# Patient Record
Sex: Male | Born: 1980 | Race: Black or African American | Hispanic: No | Marital: Single | Smoking: Never smoker
Health system: Southern US, Community
[De-identification: ages and names within clinical notes are randomized; demographics above are authoritative.]

## PROBLEM LIST (undated history)

## (undated) ENCOUNTER — Encounter

## (undated) ENCOUNTER — Ambulatory Visit
Payer: BLUE CROSS/BLUE SHIELD | Attending: Student in an Organized Health Care Education/Training Program | Primary: Student in an Organized Health Care Education/Training Program

## (undated) ENCOUNTER — Ambulatory Visit: Payer: PRIVATE HEALTH INSURANCE

## (undated) ENCOUNTER — Telehealth

## (undated) ENCOUNTER — Encounter: Attending: Specialist | Primary: Specialist

## (undated) ENCOUNTER — Ambulatory Visit: Payer: BLUE CROSS/BLUE SHIELD | Attending: Neurology | Primary: Neurology

## (undated) ENCOUNTER — Encounter
Attending: Student in an Organized Health Care Education/Training Program | Primary: Student in an Organized Health Care Education/Training Program

## (undated) ENCOUNTER — Ambulatory Visit
Payer: BLUE CROSS/BLUE SHIELD | Attending: Rehabilitative and Restorative Service Providers" | Primary: Rehabilitative and Restorative Service Providers"

## (undated) ENCOUNTER — Encounter: Attending: Nurse Practitioner | Primary: Nurse Practitioner

## (undated) ENCOUNTER — Ambulatory Visit
Payer: PRIVATE HEALTH INSURANCE | Attending: Student in an Organized Health Care Education/Training Program | Primary: Student in an Organized Health Care Education/Training Program

## (undated) ENCOUNTER — Ambulatory Visit: Payer: PRIVATE HEALTH INSURANCE | Attending: Clinical | Primary: Clinical

## (undated) ENCOUNTER — Ambulatory Visit

## (undated) ENCOUNTER — Ambulatory Visit: Payer: PRIVATE HEALTH INSURANCE | Attending: Registered" | Primary: Registered"

## (undated) ENCOUNTER — Encounter: Attending: Sports Medicine | Primary: Sports Medicine

## (undated) ENCOUNTER — Ambulatory Visit: Payer: BLUE CROSS/BLUE SHIELD

## (undated) ENCOUNTER — Encounter: Attending: Gastroenterology | Primary: Gastroenterology

## (undated) ENCOUNTER — Ambulatory Visit: Payer: BLUE CROSS/BLUE SHIELD | Attending: Family | Primary: Family

## (undated) ENCOUNTER — Telehealth: Attending: Internal Medicine | Primary: Internal Medicine

## (undated) ENCOUNTER — Telehealth
Attending: Student in an Organized Health Care Education/Training Program | Primary: Student in an Organized Health Care Education/Training Program

## (undated) ENCOUNTER — Encounter
Payer: PRIVATE HEALTH INSURANCE | Attending: Rehabilitative and Restorative Service Providers" | Primary: Rehabilitative and Restorative Service Providers"

## (undated) ENCOUNTER — Encounter: Attending: Anesthesiology | Primary: Anesthesiology

## (undated) ENCOUNTER — Ambulatory Visit: Payer: PRIVATE HEALTH INSURANCE | Attending: Family Medicine | Primary: Family Medicine

## (undated) ENCOUNTER — Encounter: Attending: Psychiatric/Mental Health | Primary: Psychiatric/Mental Health

## (undated) ENCOUNTER — Ambulatory Visit: Attending: Clinical | Primary: Clinical

## (undated) ENCOUNTER — Non-Acute Institutional Stay
Payer: PRIVATE HEALTH INSURANCE | Attending: Plastic and Reconstructive Surgery | Primary: Plastic and Reconstructive Surgery

## (undated) ENCOUNTER — Ambulatory Visit: Payer: PRIVATE HEALTH INSURANCE | Attending: Anesthesiology | Primary: Anesthesiology

## (undated) ENCOUNTER — Encounter
Attending: Rehabilitative and Restorative Service Providers" | Primary: Rehabilitative and Restorative Service Providers"

## (undated) ENCOUNTER — Other Ambulatory Visit

## (undated) ENCOUNTER — Encounter: Attending: Diagnostic Radiology | Primary: Diagnostic Radiology

## (undated) ENCOUNTER — Encounter: Payer: PRIVATE HEALTH INSURANCE | Attending: Family Medicine | Primary: Family Medicine

## (undated) ENCOUNTER — Ambulatory Visit: Payer: BLUE CROSS/BLUE SHIELD | Attending: Registered" | Primary: Registered"

## (undated) ENCOUNTER — Telehealth: Attending: Plastic and Reconstructive Surgery | Primary: Plastic and Reconstructive Surgery

## (undated) ENCOUNTER — Encounter: Attending: Family Medicine | Primary: Family Medicine

## (undated) ENCOUNTER — Encounter
Payer: PRIVATE HEALTH INSURANCE | Attending: Student in an Organized Health Care Education/Training Program | Primary: Student in an Organized Health Care Education/Training Program

## (undated) ENCOUNTER — Telehealth: Attending: "Endocrinology | Primary: "Endocrinology

## (undated) ENCOUNTER — Encounter: Attending: Clinical | Primary: Clinical

## (undated) ENCOUNTER — Ambulatory Visit: Payer: PRIVATE HEALTH INSURANCE | Attending: Gastroenterology | Primary: Gastroenterology

## (undated) ENCOUNTER — Encounter: Attending: Family | Primary: Family

## (undated) ENCOUNTER — Non-Acute Institutional Stay: Payer: PRIVATE HEALTH INSURANCE

## (undated) ENCOUNTER — Ambulatory Visit
Payer: PRIVATE HEALTH INSURANCE | Attending: Plastic and Reconstructive Surgery | Primary: Plastic and Reconstructive Surgery

## (undated) ENCOUNTER — Ambulatory Visit
Attending: Student in an Organized Health Care Education/Training Program | Primary: Student in an Organized Health Care Education/Training Program

## (undated) ENCOUNTER — Ambulatory Visit: Payer: PRIVATE HEALTH INSURANCE | Attending: Specialist | Primary: Specialist

## (undated) ENCOUNTER — Ambulatory Visit
Payer: PRIVATE HEALTH INSURANCE | Attending: Rehabilitative and Restorative Service Providers" | Primary: Rehabilitative and Restorative Service Providers"

## (undated) ENCOUNTER — Telehealth: Attending: Children | Primary: Children

## (undated) ENCOUNTER — Encounter: Payer: BLUE CROSS/BLUE SHIELD | Attending: Psychiatric/Mental Health | Primary: Psychiatric/Mental Health

## (undated) ENCOUNTER — Ambulatory Visit: Attending: Pharmacist | Primary: Pharmacist

## (undated) ENCOUNTER — Ambulatory Visit: Payer: PRIVATE HEALTH INSURANCE | Attending: Internal Medicine | Primary: Internal Medicine

## (undated) ENCOUNTER — Encounter: Payer: PRIVATE HEALTH INSURANCE | Attending: Gastroenterology | Primary: Gastroenterology

## (undated) ENCOUNTER — Telehealth: Attending: Specialist | Primary: Specialist

## (undated) ENCOUNTER — Ambulatory Visit: Attending: Registered" | Primary: Registered"

## (undated) ENCOUNTER — Inpatient Hospital Stay: Payer: BLUE CROSS/BLUE SHIELD

## (undated) ENCOUNTER — Encounter: Payer: PRIVATE HEALTH INSURANCE | Attending: Sports Medicine | Primary: Sports Medicine

## (undated) ENCOUNTER — Telehealth: Attending: Psychiatric/Mental Health | Primary: Psychiatric/Mental Health

## (undated) ENCOUNTER — Ambulatory Visit: Payer: BLUE CROSS/BLUE SHIELD | Attending: Specialist | Primary: Specialist

## (undated) ENCOUNTER — Ambulatory Visit: Attending: Nurse Practitioner | Primary: Nurse Practitioner

## (undated) ENCOUNTER — Encounter: Attending: Registered" | Primary: Registered"

## (undated) ENCOUNTER — Non-Acute Institutional Stay
Payer: PRIVATE HEALTH INSURANCE | Attending: Student in an Organized Health Care Education/Training Program | Primary: Student in an Organized Health Care Education/Training Program

## (undated) ENCOUNTER — Ambulatory Visit: Attending: Family Medicine | Primary: Family Medicine

## (undated) ENCOUNTER — Ambulatory Visit
Payer: Medicaid (Managed Care) | Attending: Student in an Organized Health Care Education/Training Program | Primary: Student in an Organized Health Care Education/Training Program

## (undated) ENCOUNTER — Ambulatory Visit: Attending: Hematology | Primary: Hematology

## (undated) ENCOUNTER — Telehealth: Attending: Clinical | Primary: Clinical

## (undated) DIAGNOSIS — K7689 Other specified diseases of liver: Secondary | ICD-10-CM

## (undated) DIAGNOSIS — K3184 Gastroparesis: Secondary | ICD-10-CM

## (undated) DIAGNOSIS — F419 Anxiety disorder, unspecified: Secondary | ICD-10-CM

## (undated) DIAGNOSIS — F909 Attention-deficit hyperactivity disorder, unspecified type: Secondary | ICD-10-CM

## (undated) DIAGNOSIS — G4700 Insomnia, unspecified: Secondary | ICD-10-CM

## (undated) DIAGNOSIS — L509 Urticaria, unspecified: Secondary | ICD-10-CM

## (undated) DIAGNOSIS — Q0700 Arnold-Chiari syndrome without spina bifida or hydrocephalus: Secondary | ICD-10-CM

## (undated) DIAGNOSIS — J45909 Unspecified asthma, uncomplicated: Secondary | ICD-10-CM

## (undated) DIAGNOSIS — G43909 Migraine, unspecified, not intractable, without status migrainosus: Secondary | ICD-10-CM

## (undated) DIAGNOSIS — K219 Gastro-esophageal reflux disease without esophagitis: Secondary | ICD-10-CM

## (undated) HISTORY — DX: Insomnia, unspecified: G47.00

## (undated) HISTORY — DX: Anxiety disorder, unspecified: F41.9

## (undated) HISTORY — DX: Migraine, unspecified, not intractable, without status migrainosus: G43.909

## (undated) HISTORY — DX: Urticaria, unspecified: L50.9

## (undated) HISTORY — PX: OTHER SURGICAL HISTORY: SHX169

## (undated) HISTORY — DX: Gastroparesis: K31.84

## (undated) HISTORY — DX: Arnold-Chiari syndrome without spina bifida or hydrocephalus: Q07.00

---
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
## 2003-06-21 HISTORY — PX: OOPHORECTOMY: SHX86

## 2012-06-20 HISTORY — PX: CHOLECYSTECTOMY: SHX55

## 2016-08-25 ENCOUNTER — Encounter: Payer: Self-pay | Admitting: Family Medicine

## 2016-09-16 ENCOUNTER — Emergency Department (HOSPITAL_COMMUNITY): Payer: 59

## 2016-09-16 ENCOUNTER — Emergency Department (HOSPITAL_COMMUNITY)
Admission: EM | Admit: 2016-09-16 | Discharge: 2016-09-16 | Disposition: A | Discharge status: Home / Self Care | Payer: 59 | Attending: Emergency Medicine | Admitting: Emergency Medicine

## 2016-09-16 ENCOUNTER — Encounter (HOSPITAL_COMMUNITY): Payer: Self-pay | Admitting: Emergency Medicine

## 2016-09-16 DIAGNOSIS — S20219A Contusion of unspecified front wall of thorax, initial encounter: Secondary | ICD-10-CM | POA: Insufficient documentation

## 2016-09-16 DIAGNOSIS — S299XXA Unspecified injury of thorax, initial encounter: Secondary | ICD-10-CM | POA: Diagnosis present

## 2016-09-16 DIAGNOSIS — Y929 Unspecified place or not applicable: Secondary | ICD-10-CM | POA: Diagnosis not present

## 2016-09-16 DIAGNOSIS — Y9389 Activity, other specified: Secondary | ICD-10-CM | POA: Diagnosis not present

## 2016-09-16 DIAGNOSIS — Y999 Unspecified external cause status: Secondary | ICD-10-CM | POA: Insufficient documentation

## 2016-09-16 DIAGNOSIS — W228XXA Striking against or struck by other objects, initial encounter: Secondary | ICD-10-CM | POA: Diagnosis not present

## 2016-09-16 LAB — CBC WITH DIFFERENTIAL/PLATELET
Basophils Absolute: 0 10*3/uL (ref 0.0–0.1)
Basophils Relative: 0 %
Eosinophils Absolute: 0.1 10*3/uL (ref 0.0–0.7)
Eosinophils Relative: 1 %
HCT: 42.3 % (ref 36.0–46.0)
Hemoglobin: 13.7 g/dL (ref 12.0–15.0)
Lymphocytes Relative: 29 %
Lymphs Abs: 2 10*3/uL (ref 0.7–4.0)
MCH: 24.2 pg — ABNORMAL LOW (ref 26.0–34.0)
MCHC: 32.4 g/dL (ref 30.0–36.0)
MCV: 74.9 fL — ABNORMAL LOW (ref 78.0–100.0)
Monocytes Absolute: 0.5 10*3/uL (ref 0.1–1.0)
Monocytes Relative: 7 %
Neutro Abs: 4.4 10*3/uL (ref 1.7–7.7)
Neutrophils Relative %: 63 %
Platelets: 372 10*3/uL (ref 150–400)
RBC: 5.65 MIL/uL — ABNORMAL HIGH (ref 3.87–5.11)
RDW: 17.8 % — ABNORMAL HIGH (ref 11.5–15.5)
WBC: 7 10*3/uL (ref 4.0–10.5)

## 2016-09-16 LAB — BASIC METABOLIC PANEL
Anion gap: 6 (ref 5–15)
BUN: 12 mg/dL (ref 6–20)
CO2: 27 mmol/L (ref 22–32)
Calcium: 9.3 mg/dL (ref 8.9–10.3)
Chloride: 104 mmol/L (ref 101–111)
Creatinine, Ser: 0.91 mg/dL (ref 0.44–1.00)
GFR calc Af Amer: >60 mL/min (ref >60)
GFR calc non Af Amer: >60 mL/min (ref >60)
Glucose, Bld: 80 mg/dL (ref 65–99)
Potassium: 3.6 mmol/L (ref 3.5–5.1)
Sodium: 137 mmol/L (ref 135–145)

## 2016-09-16 MED ORDER — IOPAMIDOL (ISOVUE-370) INJECTION 76%
100.0000 mL | Freq: Once | INTRAVENOUS | Status: AC | PRN
  Administered 2016-09-16: 100 mL via INTRAVENOUS

## 2016-09-16 MED ORDER — SODIUM CHLORIDE 0.9 % IV BOLUS (SEPSIS)
1000.0000 mL | Freq: Once | INTRAVENOUS | Status: AC
  Administered 2016-09-16: 1000 mL via INTRAVENOUS

## 2016-09-16 MED ORDER — MORPHINE SULFATE (PF) 4 MG/ML IV SOLN
4.0000 mg | Freq: Once | INTRAVENOUS | Status: AC
  Administered 2016-09-16: 4 mg via INTRAVENOUS
  Filled 2016-09-16: qty 1

## 2016-09-16 MED ORDER — IOPAMIDOL (ISOVUE-370) INJECTION 76%
INTRAVENOUS | Status: AC
  Filled 2016-09-16: qty 100

## 2016-09-16 NOTE — ED Triage Notes (Signed)
Pt complaint of mid chest soreness post "car pressing on it." Pt verbalizes was changing oil without parking brake on resulting in car to fall. Pt verbalizes "caught most of it with hands" but impact was made with chest. Was able to slide out afterwards.

## 2016-09-16 NOTE — ED Notes (Signed)
MD at bedside. 

## 2016-09-16 NOTE — ED Provider Notes (Signed)
[facility]-EMERGENCY DEPT Provider Note   CSN: [id number] Arrival date & time: 09/16/16  1739  By signing my name below, I, Gary Potts, attest that this documentation has been prepared under the direction and in the presence of Raeford Razor, MD. Electronically Signed: Rosario Potts, ED Scribe. 09/16/16. 6:39 PM.  History   Chief Complaint Chief Complaint  Patient presents with  . Chest Injury   The history is provided by the patient. No language interpreter was used.    HPI Comments: Gary Potts is a 36 y.o. male with no pertinent PMHx, who presents to the Emergency Department complaining of sudden onset, mild to moderate mid-sternal chest pain beginning s/p injury which occurred several hours ago. Per pt, she was working under her car today when her car came off of the carjack which caused the bar of the device to strike her across the mid-chest. No other reported injury. She was able to extricate herself from this position without difficulty. Pt took a Naproxen with some relief of her pain prior to her arrival. Pain is worse with palpation over the area. She notes some radiation of her pain into her upper back which began shortly following her arrival into the ED. She denies dizziness, light-headedness, or any other associated symptoms.   History reviewed. No pertinent past medical history.  There are no active problems to display for this patient.  History reviewed. No pertinent surgical history.  OB History    No data available     Home Medications    Prior to Admission medications   Not on File    Family History No family history on file.  Social History Social History  Substance Use Topics  . Smoking status: Never Smoker  . Smokeless tobacco: Not on file  . Alcohol use Yes   Allergies   Penicillins and Latex  Review of Systems Review of Systems  Cardiovascular: Positive for chest pain.  Neurological: Negative for dizziness, syncope and  light-headedness.  All other systems reviewed and are negative.  Physical Exam Updated Vital Signs BP 126/87 (BP Location: Left Arm)   Pulse 75   Temp 98.1 F (36.7 C) (Oral)   Resp 18   SpO2 100%   Physical Exam  Constitutional: She appears well-developed and well-nourished.  HENT:  Head: Normocephalic.  Right Ear: External ear normal.  Left Ear: External ear normal.  Nose: Nose normal.  Mouth/Throat: Oropharynx is clear and moist.  Eyes: Conjunctivae are normal. Right eye exhibits no discharge. Left eye exhibits no discharge.  Neck: Normal range of motion.  Cardiovascular: Normal rate, regular rhythm and normal heart sounds.   No murmur heard. Pulmonary/Chest: Effort normal and breath sounds normal. No respiratory distress. She has no wheezes. She has no rales. She exhibits tenderness.  Mild to moderate sternal tenderness.   Abdominal: Soft. She exhibits no distension. There is no tenderness. There is no rebound and no guarding.  Musculoskeletal: Normal range of motion. She exhibits no edema or tenderness.  Neurological: She is alert. No cranial nerve deficit. Coordination normal.  Skin: Skin is warm and dry. No rash noted. No erythema. No pallor.  Psychiatric: She has a normal mood and affect. Her behavior is normal.  Nursing note and vitals reviewed.  ED Treatments / Results  DIAGNOSTIC STUDIES: Oxygen Saturation is 100% on RA, normal by my interpretation.   COORDINATION OF CARE: 6:35 PM-Discussed next steps with pt. Pt verbalized understanding and is agreeable with the plan.   Labs (all labs  ordered are listed, but only abnormal results are displayed) Labs Reviewed - No data to display  EKG  EKG Interpretation None      Radiology Dg Chest 2 View  Result Date: 09/16/2016 CLINICAL DATA:  Chest injury. EXAM: CHEST  2 VIEW COMPARISON:  None. FINDINGS: There is no focal parenchymal opacity. There is no pleural effusion or pneumothorax. The heart and mediastinal  contours are unremarkable. Acute depressed distal sternal fracture with 7 mm of depression. IMPRESSION: Acute depressed distal sternal fracture with 7 mm of depression. If there is clinical concern regarding vascular injury, recommend a CTA of the chest. Electronically Signed   By: Elige Ko   On: 09/16/2016 18:19   Procedures Procedures   Medications Ordered in ED Medications - No data to display  Initial Impression / Assessment and Plan / ED Course  I have reviewed the triage vital signs and the nursing notes.  Pertinent labs & imaging results that were available during my care of the patient were reviewed by me and considered in my medical decision making (see chart for details).     36 year old male with blunt chest trauma. Possible sternal fracture on x-ray but reassuring CT. Likely chest wall contusion. She has no acute distress. Work of breathing is normal. When necessary NSAIDs. Return precautions discussed.  Final Clinical Impressions(s) / ED Diagnoses   Final diagnoses:  Contusion of chest wall, unspecified laterality, initial encounter   New Prescriptions New Prescriptions   No medications on file   I personally preformed the services scribed in my presence. The recorded information has been reviewed is accurate. Raeford Razor, MD.    Raeford Razor, MD 09/19/16 1213

## 2016-11-21 ENCOUNTER — Encounter: Payer: Self-pay | Admitting: Family Medicine

## 2016-12-30 ENCOUNTER — Ambulatory Visit (INDEPENDENT_AMBULATORY_CARE_PROVIDER_SITE_OTHER): Payer: 59 | Admitting: Pediatrics

## 2016-12-30 ENCOUNTER — Encounter: Payer: Self-pay | Admitting: Pediatrics

## 2016-12-30 VITALS — BP 100/70 | HR 84 | Temp 98.2°F | Resp 12 | Ht 63.58 in | Wt 144.8 lb

## 2016-12-30 DIAGNOSIS — T7800XD Anaphylactic reaction due to unspecified food, subsequent encounter: Secondary | ICD-10-CM

## 2016-12-30 DIAGNOSIS — T7800XA Anaphylactic reaction due to unspecified food, initial encounter: Secondary | ICD-10-CM | POA: Insufficient documentation

## 2016-12-30 DIAGNOSIS — J4520 Mild intermittent asthma, uncomplicated: Secondary | ICD-10-CM

## 2016-12-30 DIAGNOSIS — J3089 Other allergic rhinitis: Secondary | ICD-10-CM | POA: Insufficient documentation

## 2016-12-30 DIAGNOSIS — J4530 Mild persistent asthma, uncomplicated: Secondary | ICD-10-CM | POA: Insufficient documentation

## 2016-12-30 DIAGNOSIS — L503 Dermatographic urticaria: Secondary | ICD-10-CM | POA: Diagnosis not present

## 2016-12-30 MED ORDER — FLUTICASONE PROPIONATE 50 MCG/ACT NA SUSP: NASAL | 5 refills | Status: DC

## 2016-12-30 MED ORDER — RANITIDINE HCL 150 MG PO TABS: ORAL_TABLET | ORAL | 5 refills | Status: DC

## 2016-12-30 MED ORDER — MONTELUKAST SODIUM 10 MG PO TABS: ORAL_TABLET | ORAL | 5 refills | Status: DC

## 2016-12-30 MED ORDER — EPINEPHRINE 0.3 MG/0.3ML IJ SOAJ: INTRAMUSCULAR | 1 refills | Status: DC

## 2016-12-30 MED ORDER — ALBUTEROL SULFATE HFA 108 (90 BASE) MCG/ACT IN AERS: 2.0000 | INHALATION_SPRAY | RESPIRATORY_TRACT | 1 refills | Status: DC | PRN

## 2016-12-30 NOTE — Patient Instructions (Signed)
Environmental control of dust mite Fluticasone 2 sprays per nostril at night for stuffy nose Keep the dog out of the  bedroom Zyrtec 10 mg twice a day for itching Ranitidine 150 mg twice a day for itching. It may help heartburn Montelukast  10 mg once a day for coughing or wheezing Pro-air 2 puffs every 4 hours if needed for wheezing or coughing spells Do foods with salicylates make you itch?  Avoid fish and shellfish for now. If you have an allergic reaction take Benadryl 50 mg every 4 hours and if you have life-threatening symptoms inject  with EpiPen 0.3 mg I will let you know the results of your blood tests for fish and shellfish allergy

## 2016-12-30 NOTE — Progress Notes (Signed)
[address] [place] [address] Dept: [phone]  New Patient Note  Patient ID: Gary Potts, male    DOB: 11/14/1980  Age: 36 y.o. MRN: [id number] Date of Office Visit: 12/30/2016 Referring provider: Ileana LaddWong, Francis P, MD [address] [place], [place] [address]    Chief Complaint: Allergic Rhinitis  (all year round); Cough (every night); Urticaria (all over started about 3 years ago. Has had boils which then treated with bactrim.); Rash (where she feels likes she is itching within. pt. has seen dermatologist.); and Allergic Reaction (alaskan fish broke out with a rash. throat getting puffy.)  [facility] for evaluation of itching and hives for 3 years. He has had folliculitis during this time. He is transgender and emotionally male. He has had a cough for about a year. The cough is worse at night. He has gastroesophageal reflux not well controlled with omeprazole 20 mg twice a day He  has had hives from shellfish. He ate an [place] fish and developed hives and swelling of his face and throat.Marland Kitchen. He has had perennial nasal congestion aggravated by exposure to dust for several years. He has never had asthma or eczema or chronic urticaria in the past  Review of Systems  Constitutional: Negative.   HENT:       Perennial nasal congestion for several years  Eyes: Negative.   Respiratory:       Coughing spells at night for about a year  Cardiovascular: Negative.   Gastrointestinal:       Gastroesophageal reflux not responsive to  omeprazole 20 mg twice a day. 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   Skin:       Itching and hives for 3 years. Folliculitis for about 3 years.  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 acetaminophen (TYLENOL) 325 MG tablet Take 650 mg by mouth every 6 (six) hours as needed (pain).  . clindamycin (CLEOCIN T) 1 % lotion   . clonazePAM (KLONOPIN) 2 MG tablet Take 2 mg by mouth 3 (three) times daily as needed for irritation.  Marland Kitchen. doxycycline (VIBRAMYCIN) 100 MG capsule   . Gabapentin 300 MG/6ML SOLN Take 6 mLs by mouth at bedtime.  Marland Kitchen. ibuprofen (ADVIL,MOTRIN) 600 MG tablet   . testosterone cypionate (DEPOTESTOSTERONE CYPIONATE) 200 MG/ML injection Inject 0.4 mLs into the muscle once a week.  Marland Kitchen. albuterol (PROAIR HFA) 108 (90 Base) MCG/ACT inhaler Inhale 2 puffs into the lungs every 4 (four) hours as needed for wheezing or shortness of breath.  . EPINEPHrine (EPIPEN 2-PAK) 0.3 mg/0.3 mL IJ SOAJ injection Use as directed for severe allergic reaction.  . fluticasone (FLONASE) 50 MCG/ACT nasal spray 2 sprays per nostril at night for stuffy nose.  . montelukast (SINGULAIR) 10 MG tablet One tablet by mouth once a day for coughing or wheezing.  . naproxen (NAPROSYN) 500 MG tablet Take 500 mg by mouth 2 (two) times daily with a meal.  . ranitidine (ZANTAC) 150 MG tablet Take one tablet by mouth twice a day for itching. It may help heartburn.   No facility-administered encounter medications on file as of 12/30/2016.      Drug Allergies:  Allergies  Allergen Reactions  .  Penicillins Anaphylaxis and Rash    Pt can tolerate amoxicillin  Has patient had a PCN reaction causing immediate rash, facial/tongue/throat swelling, SOB or lightheadedness with hypotension: no  Has patient had a PCN reaction causing severe rash involving mucus membranes or skin necrosis: no Has patient had a PCN reaction that required hospitalization: unknown Has patient had a PCN reaction occurring within the last 10 years: no If all of the above answers are "NO", then may proceed with Cephalosporin use.   . Latex Rash    Family History: Sherry's family history includes Allergic rhinitis in her  mother; Sinusitis in her mother and sister; Urticaria in her sister..There is no family history of asthma, angioedema, eczema, food allergies, lupus, chronic bronchitis or emphysema  Social and environmental. There is a dog in the home. He is not exposed to cigarette smoking. He has not smoked cigarettes in the past. He is unemployed  Physical Exam: BP 100/70 (BP Location: Left Arm, Patient Position: Sitting, Cuff Size: Normal)   Pulse 84   Temp 98.2 F (36.8 C) (Oral)   Resp 12   Ht 5' 3.58" (1.615 m)   Wt 144 lb 13.5 oz (65.7 kg)   SpO2 98%   BMI 25.19 kg/m    Physical Exam  Constitutional: She is oriented to person, place, and time. She appears well-developed and well-nourished.  HENT:  Eyes normal. Ears normal. Nose mild swelling of nasal  turbinates. Pharynx normal.  Neck: Neck supple. No thyromegaly present.  Cardiovascular:  S1 and S2 normal no murmurs  Pulmonary/Chest:  Clear to percussion and auscultation  Abdominal: Soft. There is no tenderness (no hepatosplenomegaly).  Lymphadenopathy:    She has no cervical adenopathy.  Neurological: She is alert and oriented to person, place, and time.  Skin:  Hyperpigmentation in areas that she had had folliculitis. Dermographia noted. One hive noted in the left arm  Psychiatric: She has a normal mood and affect. Her behavior is normal. Judgment and thought content normal.  Vitals reviewed.   Diagnostics: FVC 2.97 L FEV1 2.70 L. Predicted FVC 3.19 L predicted FEV1 2.68 L. After albuterol 2 puffs FVC 2.98 L FEV1 2.74 L-the spirometry is in the normal range and there was no significant improvement after albuterol  Allergy skin tests were positive to dust mites. There was mild reactivity to shrimp and flounder. There was mild reactivity to dog   Assessment  Assessment and Plan: 1. 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 Mild intermittent reactive airway disease without complication   3.  Anaphylactic shock due to food, subsequent encounter   4. Other allergic rhinitis   5. Dermographia     Meds ordered this encounter  Medications  . EPINEPHrine (EPIPEN 2-PAK) 0.3 mg/0.3 mL IJ SOAJ injection    Sig: Use as directed for severe allergic reaction.    Dispense:  2 Device    Refill:  1    Dispense mylan brand or generic whichever is cheaper for the pt.  . fluticasone (FLONASE) 50 MCG/ACT nasal spray    Sig: 2 sprays per nostril at night for stuffy nose.    Dispense:  16 g    Refill:  5  . ranitidine (ZANTAC) 150 MG tablet    Sig: Take one tablet by mouth twice a day for itching. It may help heartburn.    Dispense:  60 tablet    Refill:  5  . montelukast (SINGULAIR) 10 MG tablet    Sig: One tablet  by mouth once a day for coughing or wheezing.    Dispense:  34 tablet    Refill:  5  . albuterol (PROAIR HFA) 108 (90 Base) MCG/ACT inhaler    Sig: Inhale 2 puffs into the lungs every 4 (four) hours as needed for wheezing or shortness of breath.    Dispense:  1 Inhaler    Refill:  1    Patient Instructions  Environmental control of dust mite Fluticasone 2 sprays per nostril at night for stuffy nose Keep the dog out of the  bedroom Zyrtec 10 mg twice a day for itching Ranitidine 150 mg twice a day for itching. It may help heartburn Montelukast  10 mg once a day for coughing or wheezing Pro-air 2 puffs every 4 hours if needed for wheezing or coughing spells Do foods with salicylates make you itch?  Avoid fish and shellfish for now. If you have an allergic reaction take Benadryl 50 mg every 4 hours and if you have life-threatening symptoms inject  with EpiPen 0.3 mg I will let you know the results of your blood tests for fish and shellfish allergy   Return in about 4 weeks (around 01/27/2017).   Thank you for the opportunity to care for this patient.  Please do not hesitate to contact me with questions.  Tonette Bihari, M.D.  [facility] [address] [place], [place] [address] [phone]

## 2017-01-07 LAB — ALLERGEN PROFILE, FOOD-FISH
Codfish IgE: <0.1 kU/L
Halibut IgE: <0.1 kU/L

## 2017-01-07 LAB — ALLERGEN PROFILE, SHELLFISH
Clam IgE: <0.1 kU/L
F023-IGE CRAB: 0.34 kU/L — AB
F080-IgE Lobster: 0.26 kU/L — AB
F290-IgE Oyster: <0.1 kU/L
SHRIMP IGE: 0.18 kU/L — AB
Scallop IgE: <0.1 kU/L

## 2017-01-19 ENCOUNTER — Telehealth: Payer: Self-pay

## 2017-01-19 NOTE — Telephone Encounter (Signed)
Pt called in and stated Started having a sever itchy rash, that is raised. He stats it starts at his neck and goes all the way to his feet. Itchiest part is his ears. States there is a lot of mucus he is throwing up.He continues on all meds that was prescribed by Dr B.   Please advise

## 2017-01-19 NOTE — Telephone Encounter (Signed)
Patient has an appointment scheduled for Monday at 3:30. We offered an appointment for tomorrow at 3:15 and Monday at 8:30  but patient couldn't make it. Gary Potts CMA advised her to stop Doxycycline immediately and go to ER if any further complications.

## 2017-01-19 NOTE — Telephone Encounter (Signed)
Lyla SonCarrie stated that this was a new and different type of rash than ever before experienced by the patient.  Due to the aggressive spread of the rash, I believe the patient should discontinue doxycycline immediately and come in for an office visit tomorrow.  If his symptoms progress, the patient should seek medical attention immediately.

## 2017-01-19 NOTE — Telephone Encounter (Signed)
Please call patient and get her an appointment ASAP

## 2017-01-19 NOTE — Telephone Encounter (Signed)
Per dr bobbitt I have told pt to stop doxy and come in to see us monday

## 2017-01-23 ENCOUNTER — Ambulatory Visit (INDEPENDENT_AMBULATORY_CARE_PROVIDER_SITE_OTHER): Payer: 59 | Admitting: Pediatrics

## 2017-01-23 ENCOUNTER — Ambulatory Visit: Payer: 59 | Admitting: Pediatrics

## 2017-01-23 ENCOUNTER — Encounter: Payer: Self-pay | Admitting: Pediatrics

## 2017-01-23 VITALS — BP 124/74 | HR 76 | Temp 98.1°F | Resp 16 | Ht 63.47 in | Wt 145.6 lb

## 2017-01-23 DIAGNOSIS — L503 Dermatographic urticaria: Secondary | ICD-10-CM | POA: Diagnosis not present

## 2017-01-23 DIAGNOSIS — T7800XD Anaphylactic reaction due to unspecified food, subsequent encounter: Secondary | ICD-10-CM | POA: Diagnosis not present

## 2017-01-23 DIAGNOSIS — J3089 Other allergic rhinitis: Secondary | ICD-10-CM | POA: Diagnosis not present

## 2017-01-23 DIAGNOSIS — L508 Other urticaria: Secondary | ICD-10-CM | POA: Diagnosis not present

## 2017-01-23 DIAGNOSIS — J4530 Mild persistent asthma, uncomplicated: Secondary | ICD-10-CM

## 2017-01-23 NOTE — Progress Notes (Signed)
[name] [place] [address] Dept: [phone]  FOLLOW UP NOTE  Patient ID: Gary Potts, male    DOB: May 19, 1981  Age: 36 y.o. MRN: [id number] Date of Office Visit: 01/23/2017  Assessment  Chief Complaint: Rash (generalized rash every day at 3pm after eating lunch.); Nasal Congestion; and Cough  HPI [facility] presents for follow-up of chronic urticaria for 3 years. Last week while at work, he ate beef noodle  soup and had more itching and shortness of breath. They're doing a great deal of cleaning at work. He is allergic to dust mites. He has been having significant nasal congestion . He had hives again after eating on the 2 subsequent days while at work. Doxycycline was stopped he has continued to itch. The folliculitis became  worse after doxycycline was stopped, so he went back on doxycycline and the itching is the  same as in the past . He has dermographia. He avoids [place]an fish and shellfish. He is able to eat flounder, if he takes Benadryl. He has never had an allergic reaction to flounder  He is not having any coughing or wheezing except for the episode last week.Marland Kitchen His serum Immunocap IgE was negative for cod, halibut, tuna, salmon, trout, mackerel, pike, salmon. He had mild reactivity to crab, shrimp, lobster . He has had a full evaluation for chronic  urticaria by his dermatologist.   Drug Allergies:  Allergies  Allergen Reactions  . Penicillins Anaphylaxis and Rash    Pt can tolerate amoxicillin  Has patient had a PCN reaction causing immediate rash, facial/tongue/throat swelling, SOB or lightheadedness with hypotension: no  Has patient had a PCN reaction causing severe rash involving mucus membranes or skin necrosis: no Has patient had a PCN reaction that required hospitalization: unknown Has patient had a PCN reaction occurring within the last 10 years: no If all of the above answers are "NO", then may proceed with Cephalosporin use.   . Shellfish Allergy  Anaphylaxis  . Latex Rash    Physical Exam: BP 124/74 (BP Location: Left Arm, Patient Position: Sitting, Cuff Size: Normal)   Pulse 76   Temp 98.1 F (36.7 C) (Oral)   Resp 16   Ht 5' 3.47" (1.612 m)   Wt 145 lb 9.6 oz (66 kg)   SpO2 98%   BMI 25.42 kg/m    Physical Exam  Constitutional: She is oriented to person, place, and time. She appears well-developed and well-nourished.  HENT:  Eyes normal. Ears normal. Nose moderate swelling of nasal turbinates. Pharynx normal..  Neck: Neck supple.  Cardiovascular:  S1 and S2 normal no murmurs  Pulmonary/Chest:  Clear to percussion and auscultation  Lymphadenopathy:    She has no cervical adenopathy.  Neurological: She is alert and oriented to person, place, and time.  Skin:  Clear except for mild folliculitis  Psychiatric: She has a normal mood and affect. Her behavior is normal. Judgment and thought content normal.  Vitals reviewed.   Diagnostics:  FVC 2.60 L FEV1 2.40 L. Predicted FVC 3.19 L predicted FEV1 2.68 L-the spirometry is in the normal range  Assessment and Plan: 1. Mild persistent reactive airway disease without complication   2. Chronic urticaria   3. Dermographia   4. Anaphylactic shock due to food, subsequent encounter   5. Other allergic rhinitis        Patient Instructions  Zyrtec 10 mg twice a day for itching Ranitidine 150 mg twice a day  Montelukast 10 mg once a day  for coughing and wheezing. It may help hives Pro-air 2 puffs every 4 hours if needed for wheezing or coughing spells Add prednisone 20 mg twice a day for 3 days, 20 mg on the fourth day, 10 mg in the fifth day to see if it helps the itching and hives He will see if foods that contain salicylates make him itch Call me next week. I gave him information regarding the use of Xolair for chronic urticaria . If he is still itching next week , I will give him a sample injection of Xolair 300 mg We will apply for Xolair administration due to the  severity of his chronic urticaria for 3 years  Avoid [place]askan fish and shellfish and the beef noodle soup. If he has an allergic reaction he will  take Benadryl 50 mg every 4 hours and if he has life-threatening symptoms inject with EpiPen 0.3 mg   Return in about 4 weeks (around 02/20/2017).    Thank you for the opportunity to care for this patient.  Please do not hesitate to contact me with questions.  Tonette BihariJ. A. Elyas Villamor, M.D.  [facility] [place] [address] [place], [place] [address] [phone]

## 2017-01-23 NOTE — Patient Instructions (Addendum)
Zyrtec 10 mg twice a day for itching Ranitidine 150 mg twice a day  Montelukast 10 mg once a day for coughing and wheezing. It may help hives Pro-air 2 puffs every 4 hours if needed for wheezing or coughing spells Add prednisone 20 mg twice a day for 3 days, 20 mg on the fourth day, 10 mg in the fifth day to see if it helps the itching and hives He will see if foods that contain salicylates make him itch Call me next week. I gave him information regarding the use of Xolair for chronic urticaria . If he is still itching next week , I will give him a sample injection of Xolair 300 mg We will apply for Xolair administration due to the severity of his chronic urticaria for 3 years  Avoid [place]askan fish and shellfish and the beef noodle soup. If he has an allergic reaction he will  take Benadryl 50 mg every 4 hours and if he has life-threatening symptoms inject with EpiPen 0.3 mg

## 2017-01-25 ENCOUNTER — Telehealth: Payer: Self-pay | Admitting: Pediatrics

## 2017-01-25 NOTE — Telephone Encounter (Signed)
Pt was in the office to see Dr. Beaulah DinningBardelas this week and was put on Prednisone. Pt says she is itching badly from it. Please call pt back. Thanks

## 2017-01-25 NOTE — Telephone Encounter (Signed)
Spoke with pt. He states this has happened before with prednisone. I advised him to stop taking it, he stated he already had. When he took it with breakfast and lunch yesterday he developed uncontrollable itching and whelps. He is continuing on all medications as prescribed. Please advise

## 2017-01-25 NOTE — Telephone Encounter (Signed)
Switch from cetirizine twice a day to levocetirizine twice a day. Also, if symptoms continue have pt come in for sample Xolair injection has discussed by Dr. Beaulah DinningBardelas.

## 2017-01-26 NOTE — Telephone Encounter (Signed)
Pt started vomiting last night and has not stopped. He believes it is from the prednisone. He attempted to take his allergy meds last night and vomited them up, again this morning tried to take them and same thing. No fever no chills Please advise

## 2017-01-26 NOTE — Telephone Encounter (Signed)
Spoke to patient advised to go see pcp. States he is in the process changing pcps. Advised to go to urgent care if vomiting persist patient verbalized understanding.

## 2017-01-26 NOTE — Telephone Encounter (Signed)
Pt should see PCP for vomiting.

## 2017-01-30 ENCOUNTER — Telehealth: Payer: Self-pay

## 2017-01-30 NOTE — Telephone Encounter (Signed)
Pt called in this and stated that he  Is still having itching and hives. Pt states it is red and warm and very itchy.  He states this is all still from the prednisone. He has stopped taking prednisone. Pt is taking benadryl and zyrtec and still not getting relief.  Please advise

## 2017-01-31 NOTE — Telephone Encounter (Addendum)
Called patient.  He is taking Zyrtec 10 mg twice a day and ranitidine twice a day.  He scheduled an appointment for this week to get the first Xolair (free dose of Xolair per Dr. Beaulah DinningBardelas) on Friday 02/03/17 at 9:00 am and he will contact Tammy to get Xolair set up.  [employer] approved Xolair. Dr. Beaulah DinningBardelas informed.

## 2017-01-31 NOTE — Telephone Encounter (Signed)
He needs to follow precisely the plan that I gave him at the last visit. If we have Xolair for chronic urticaria as a sample we can give it . He was approved for 6 months of Xolair

## 2017-02-03 ENCOUNTER — Encounter: Payer: Self-pay | Admitting: Pediatrics

## 2017-02-03 ENCOUNTER — Ambulatory Visit (INDEPENDENT_AMBULATORY_CARE_PROVIDER_SITE_OTHER): Payer: 59

## 2017-02-03 DIAGNOSIS — L500 Allergic urticaria: Secondary | ICD-10-CM | POA: Diagnosis not present

## 2017-02-03 MED ORDER — OMALIZUMAB 150 MG ~~LOC~~ SOLR
300.0000 mg | SUBCUTANEOUS | Status: DC
  Administered 2017-02-03 – 2018-02-14 (×8): 300 mg via SUBCUTANEOUS

## 2017-02-11 ENCOUNTER — Ambulatory Visit: Payer: 59 | Admitting: Family Medicine

## 2017-02-15 ENCOUNTER — Ambulatory Visit: Admission: RE | Admit: 2017-02-15 | Discharge: 2017-02-15 | Attending: Sports Medicine | Admitting: Sports Medicine

## 2017-02-15 DIAGNOSIS — M25511 Pain in right shoulder: Principal | ICD-10-CM

## 2017-02-17 ENCOUNTER — Other Ambulatory Visit: Payer: Self-pay

## 2017-02-17 ENCOUNTER — Telehealth: Payer: Self-pay

## 2017-02-17 MED ORDER — HYDROXYZINE HCL 50 MG PO TABS: ORAL_TABLET | ORAL | 3 refills | Status: DC

## 2017-02-17 NOTE — Telephone Encounter (Signed)
Spoke to pt. To let him know that Dr. Dellis AnesGallagher wanted him to take 2 tablets of cetirizine 10mg  and hydroxyzine 50mg  2 tablets at night. To help with the itching. Pt. Aware.

## 2017-02-17 NOTE — Telephone Encounter (Signed)
Pt. States he received a cortisone injection this past Monday and by the evening he was experiencing extreme unbearable itching. He took benadryl and his antihistamine. Yesterday he stated he experienced trouble breathing and took 3 benadryl his trouble breathing subsided. Pt. Does carry epipen told him if that happens again he should have used his epipen. Pt. Still experiencing itching. Please advise.

## 2017-02-17 NOTE — Telephone Encounter (Signed)
Reviewed note. Let's recommended cetirizine 20mg  (two tablets) in the morning and hydroxyzine 50mg  (two tablets) at night.   Malachi BondsJoel Liliahna Cudd, MD [facility]

## 2017-02-22 ENCOUNTER — Encounter: Payer: Self-pay | Admitting: Family Medicine

## 2017-02-22 ENCOUNTER — Ambulatory Visit (INDEPENDENT_AMBULATORY_CARE_PROVIDER_SITE_OTHER): Payer: 59 | Admitting: Family Medicine

## 2017-02-22 VITALS — BP 132/78 | HR 103 | Temp 98.2°F | Resp 18 | Ht 63.5 in | Wt 148.8 lb

## 2017-02-22 DIAGNOSIS — F902 Attention-deficit hyperactivity disorder, combined type: Secondary | ICD-10-CM

## 2017-02-22 DIAGNOSIS — Z23 Encounter for immunization: Secondary | ICD-10-CM | POA: Diagnosis not present

## 2017-02-22 MED ORDER — AMPHETAMINE-DEXTROAMPHET ER 30 MG PO CP24: 30.0000 mg | ORAL_CAPSULE | Freq: Every day | ORAL | 0 refills | Status: DC

## 2017-02-22 MED ORDER — CLONAZEPAM 0.5 MG PO TABS: 0.5000 mg | ORAL_TABLET | Freq: Two times a day (BID) | ORAL | 0 refills | Status: DC | PRN

## 2017-02-22 NOTE — Progress Notes (Signed)
Subjective:  By signing my name below, I, Stann Ore, attest that this documentation has been prepared under the direction and in the presence of Norberto Sorenson, MD. Electronically Signed: Stann Ore, Scribe. 02/22/2017 , 9:47 AM .  Patient was seen in Room 3 .   Patient ID: Gary Potts, male    DOB: 1981/03/27, 36 y.o.   MRN: [id number] Chief Complaint  Patient presents with  . Establish Care   HPI Gary Potts is a 36 y.o. male who presents to Primary Care at [facility] to transfer and establish care.   Allergist- Dr. Beaulah Dinning, has urticaria in the summer, usually resolves after hot season ends.   Dermatologist- taking doxycyline for folliculitis  Endocrinologist- Dr. Dionisio Paschal, on Testosterone.   Weight change- Dr. Beaulah Dinning thought weight change due to Testosterone.   Wt Readings from Last 3 Encounters:  02/22/17 148 lb 12.8 oz (67.5 kg)  01/23/17 145 lb 9.6 oz (66 kg)  12/30/16 144 lb 13.5 oz (65.7 kg)   PCP- was seeing Dr. Modesto Charon at [place]. Was prescribed meloxicam for sternum contusion, then referred to Dr. Leonel Ramsay for right shoulder pain and sternum, and was given cortisone shot.   ADHD- Sees Harless Litten, seen last week, started seeing in Jan 2018. Name change in Jan, from [place] to [place]. Will see psychiatry at [facility]. Been off Adderall for about 2 months now.  History: "hyperactive" at 36 years old, started ritalin at 36 years old. Became resistant to it around 6th grade. Was off medications in 7th grade, but was too hyper and was back on ritalin. Was followed by Dallie Piles in [facility] at [facility] Tried Vyvanse without relief. Was tried off klonopin for valium, but valium caused him to be more agitated.   Family- Recently moved up here from [place] with his wife.   Work- Works in Office manager.   Past Medical History:  Diagnosis Date  . Anxiety   . Migraines   . Rash   . Urticaria    Prior to Admission medications   Medication Sig Start  Date End Date Taking? Authorizing Provider  acetaminophen (TYLENOL) 325 MG tablet Take 650 mg by mouth every 6 (six) hours as needed (pain).   Yes [provider]  albuterol (PROAIR HFA) 108 (90 Base) MCG/ACT inhaler Inhale 2 puffs into the lungs every 4 (four) hours as needed for wheezing or shortness of breath. 12/30/16  Yes Bardelas, Bonnita Hollow, MD  clindamycin (CLEOCIN T) 1 % lotion  12/05/16  Yes [provider]  clonazePAM (KLONOPIN) 2 MG tablet Take 2 mg by mouth 3 (three) times daily as needed for irritation. 09/01/16  Yes [provider]  dicyclomine (BENTYL) 10 MG capsule  01/12/17  Yes [provider]  doxycycline (VIBRAMYCIN) 100 MG capsule Take by mouth daily.  12/05/16  Yes [provider]  EPINEPHrine (EPIPEN 2-PAK) 0.3 mg/0.3 mL IJ SOAJ injection Use as directed for severe allergic reaction. 12/30/16  Yes Bardelas, Jose A, MD  fluticasone (FLONASE) 50 MCG/ACT nasal spray 2 sprays per nostril at night for stuffy nose. 12/30/16  Yes Bardelas, Bonnita Hollow, MD  Gabapentin 300 MG/6ML SOLN Take 6 mLs by mouth at bedtime. 08/26/16  Yes [provider]  hydrOXYzine (ATARAX/VISTARIL) 50 MG tablet 2 tablets by mouth at night to help with the itching. 02/17/17  Yes Fletcher Anon, MD  ibuprofen (ADVIL,MOTRIN) 600 MG tablet  11/21/16  Yes [provider]  montelukast (SINGULAIR) 10 MG tablet One tablet by mouth  once a day for coughing or wheezing. 12/30/16  Yes Bardelas, Bonnita Hollow, MD  ranitidine (ZANTAC) 150 MG tablet Take one tablet by mouth twice a day for itching. It may help heartburn. 12/30/16  Yes Bardelas, Bonnita Hollow, MD  testosterone cypionate (DEPOTESTOSTERONE CYPIONATE) 200 MG/ML injection Inject 0.4 mLs into the muscle once a week. 08/31/16  Yes [provider]   Allergies  Allergen Reactions  . Penicillins Anaphylaxis and Rash    Pt can tolerate amoxicillin  Has patient had a PCN reaction causing immediate rash, facial/tongue/throat  swelling, SOB or lightheadedness with hypotension: no  Has patient had a PCN reaction causing severe rash involving mucus membranes or skin necrosis: no Has patient had a PCN reaction that required hospitalization: unknown Has patient had a PCN reaction occurring within the last 10 years: no If all of the above answers are "NO", then may proceed with Cephalosporin use.   . Shellfish Allergy Anaphylaxis  . Latex Rash   Past Surgical History:  Procedure Laterality Date  . CHOLECYSTECTOMY  2014  . OOPHORECTOMY Right 2005   Family History  Problem Relation Age of Onset  . Allergic rhinitis Mother   . Sinusitis Mother   . Sinusitis Sister   . Urticaria Sister   . Angioedema Neg Hx   . Asthma Neg Hx   . Eczema Neg Hx   . Immunodeficiency Neg Hx    Social History   Social History  . Marital status: Married    Spouse name: N/A  . Number of children: N/A  . Years of education: N/A   Social History Main Topics  . Smoking status: Never Smoker  . Smokeless tobacco: Never Used  . Alcohol use Yes  . Drug use: No  . Sexual activity: Yes   Other Topics Concern  . None   Social History Narrative  . None   Depression screen [facility] 2/9 02/22/2017  Decreased Interest 0  Down, Depressed, Hopeless 0  PHQ - 2 Score 0    Review of Systems  Constitutional: Negative for chills, fatigue, fever and unexpected weight change.  Respiratory: Negative for cough.   Gastrointestinal: Negative for constipation, diarrhea, nausea and vomiting.  Skin: Negative for rash and wound.  Neurological: Negative for dizziness, weakness and headaches.  Psychiatric/Behavioral: Positive for decreased concentration.       Objective:   Physical Exam  Constitutional: She is oriented to person, place, and time. She appears well-developed and well-nourished. No distress.  HENT:  Head: Normocephalic and atraumatic.  Eyes: Pupils are equal, round, and reactive to light. EOM are normal.  Neck: Neck supple.    Cardiovascular: Normal rate, regular rhythm, S1 normal, S2 normal and normal heart sounds.  Exam reveals no gallop and no friction rub.   No murmur heard. Pulmonary/Chest: Effort normal and breath sounds normal. No respiratory distress. She has no decreased breath sounds.  Musculoskeletal: Normal range of motion.  Neurological: She is alert and oriented to person, place, and time.  Skin: Skin is warm and dry.  Psychiatric: She has a normal mood and affect. Her behavior is normal.  Nursing note and vitals reviewed.   BP 132/78   Pulse (!) 103   Temp 98.2 F (36.8 C) (Oral)   Resp 18   Ht 5' 3.5" (1.613 m)   Wt 148 lb 12.8 oz (67.5 kg)   SpO2 93%   BMI 25.95 kg/m       Assessment & Plan:   1. Attention deficit hyperactivity disorder (ADHD),  combined type   2. Need for prophylactic vaccination and inoculation against influenza    Pt has appt w/ psych coming up.  Recheck in 1 mo. Records req from 6 specialists/prior PCP.  Orders Placed This Encounter  Procedures  . Flu Vaccine QUAD 36+ mos IM    Meds ordered this encounter  Medications  . diclofenac sodium (VOLTAREN) 1 % GEL    Sig: Apply 1 application topically 2 (two) times daily as needed.  Marland Kitchen. amphetamine-dextroamphetamine (ADDERALL XR) 30 MG 24 hr capsule    Sig: Take 1 capsule (30 mg total) by mouth daily.    Dispense:  30 capsule    Refill:  0  . clonazePAM (KLONOPIN) 0.5 MG tablet    Sig: Take 1 tablet (0.5 mg total) by mouth 2 (two) times daily as needed for anxiety.    Dispense:  60 tablet    Refill:  0   Today I have utilized the [place] Controlled Substance Registry's online query to confirm compliance regarding the patient's narcotic pain medications. My review reveals appropriate prescription fills and that [facility] is the sole provider of these medications. Rechecks will occur regularly and the patient is aware of our use of the system.  I personally performed the services described in this  documentation, which was scribed in my presence. The recorded information has been reviewed and considered, and addended by me as needed.   Norberto SorensonEva Shaw, M.D.  Primary Care at [facility]  [place] [address] [place], [place] [address] [phone] phone [phone]) [phone] fax  02/24/17 10:46 PM

## 2017-02-22 NOTE — Patient Instructions (Addendum)
Follow-up in 1 month to check on med change and ensure smooth transition to psychiatry as well as to review outside records and ensure any other med refills. If I continue to prescribe you ADD/stimulant therapy, we will do a drug screen at your next visit to ensure it correlates with your prescribed medications. Try to wean OFF of the klonopin gradually as much as possible.   IF you received an x-ray today, you will receive an invoice from [facility]. Please contact [facility] at [phone] with questions or concerns regarding your invoice.   IF you received labwork today, you will receive an invoice from [facility]. Please contact [facility] at [phone] with questions or concerns regarding your invoice.   Our billing staff will not be able to assist you with questions regarding bills from these companies.  You will be contacted with the lab results as soon as they are available. The fastest way to get your results is to activate your My Chart account. Instructions are located on the last page of this paperwork. If you have not heard from us regarding the results in 2 weeks, please contact this office.     [facility] at [facility] Policy for Prescribing Controlled Substances  (Revised 04/2012, 12/2016) 1. Prescriptions for controlled substances will be filled by ONE provider at PCP with whom you have established and developed a plan for your care, including follow-up. 2. Providers at PCP follow the health system rules for prescribing controlled substances, which was developed using recommendations from the Centers For Disease Control. 3. You are encouraged to schedule an appointment with your prescriber in advance for follow-up visits whenever possible.

## 2017-02-24 ENCOUNTER — Telehealth: Payer: Self-pay

## 2017-02-24 MED ORDER — HYDROXYZINE HCL 50 MG PO TABS: 50.0000 mg | ORAL_TABLET | Freq: Every evening | ORAL | 3 refills | Status: DC | PRN

## 2017-02-24 NOTE — Telephone Encounter (Signed)
I called patient to advise on hydroxyzine 50 mg. Dr. Beaulah DinningBardelas advised me that patient should only be taking 1 tablet at night if needed for the itching. Patient also advised me that the Xolair was approved and his Xolair is in office in [place] for administration next week.

## 2017-02-27 ENCOUNTER — Telehealth: Payer: Self-pay

## 2017-02-27 NOTE — Telephone Encounter (Signed)
Pt called in stating that he broke out in hives yesterday on his back. He is doing everything he is suppost too. He is taking all his meds. He states they are itchy. He believes only thing new is the change in weather and while being outside yesterday for maybe an hour they popped up.  Please advise

## 2017-02-27 NOTE — Telephone Encounter (Signed)
Continue on all his medications for hives  Let me know if he is not doing well

## 2017-02-28 NOTE — Telephone Encounter (Signed)
Spoke with pt informed him of what dr b stated.

## 2017-02-28 NOTE — Telephone Encounter (Signed)
Lm for pt to call us back  

## 2017-03-01 ENCOUNTER — Other Ambulatory Visit: Payer: Self-pay | Admitting: Allergy

## 2017-03-01 ENCOUNTER — Telehealth: Payer: Self-pay | Admitting: Allergy

## 2017-03-01 MED ORDER — LEVOCETIRIZINE DIHYDROCHLORIDE 5 MG PO TABS: 5.0000 mg | ORAL_TABLET | Freq: Every evening | ORAL | 5 refills | Status: DC

## 2017-03-01 MED ORDER — PREDNISONE 10 MG PO TABS: ORAL_TABLET | ORAL | 0 refills | Status: DC

## 2017-03-01 NOTE — Telephone Encounter (Signed)
Switch from cetirizine to levocetirzine. Add ranitidine 150 bid if patient is not already taking it. Provide prednisone 20 mg x 4 days, 10 mg x1 day, then stop.  Thanks.

## 2017-03-01 NOTE — Telephone Encounter (Signed)
I sent scripts. I called patient. Left message for them to call back.

## 2017-03-01 NOTE — Telephone Encounter (Signed)
Patient called said she has had hives since Sunday and getting worse.said it was  on chest,back and .bottom Taking all Medicine that she is suppose to.Getting worse. Said Benadryl does not help.Please advise

## 2017-03-01 NOTE — Telephone Encounter (Signed)
Dr Worthy FlankBardelas  Giovonni called back and said she was on the levoceritizne and was on the Ranitidine 159 also. Patient said she was taking hydrOXYzine also. she said Dr. Beaulah DinningBardelas Did not want him to take prednisone Was talking to her and she hung up on me.

## 2017-03-02 ENCOUNTER — Ambulatory Visit (INDEPENDENT_AMBULATORY_CARE_PROVIDER_SITE_OTHER): Payer: 59 | Admitting: *Deleted

## 2017-03-02 DIAGNOSIS — L500 Allergic urticaria: Secondary | ICD-10-CM | POA: Diagnosis not present

## 2017-03-02 NOTE — Telephone Encounter (Signed)
Patient is scheduled for injection this morning. I will have shot nurse discuss at that time.

## 2017-03-03 ENCOUNTER — Ambulatory Visit: Payer: Self-pay

## 2017-03-06 NOTE — Telephone Encounter (Signed)
Levocetirize, ranitidine, proair prn, doxycyline  qd, hydroxyzine  qhs for itching, skin ointment, clindamycin 1% bid, montelukast 10 mg prn for cough or wheezing, Flonase 2 sprays prn, gabapentin qhs, clonazepam 0.5mg  tid.   Pt states the hives are still there and have not left.  Next Xolair injection is oct 11th, just had last injection sept 13.

## 2017-03-06 NOTE — Telephone Encounter (Signed)
Pt called back and stated that he recently moved and that after every shoer/bath his break out always gets wrose

## 2017-03-06 NOTE — Telephone Encounter (Signed)
Lm for pt to call us back  

## 2017-03-06 NOTE — Telephone Encounter (Signed)
Call patient and get a list of all the medications that he is taking . How are the hives doing ? When is he due  for the next Xolair injection?

## 2017-03-07 NOTE — Telephone Encounter (Signed)
Call patient. Does he take showers at night or in the morning. Is he on  levocetirizine 5 mg in the morning and hydroxyzine 50 mg at night . Do not rub hard after a shower or bath . Cool compress on the skin for a few minutes will help the itching after shower or bath

## 2017-03-07 NOTE — Telephone Encounter (Signed)
Pt takes levo bid and hydroxazine qhs. Pt has tried the cool compress and patting dry after shower per his dermatologist. He states that hasn't helped. I asked him if he has had his water tested, he stated he has not.

## 2017-03-08 NOTE — Telephone Encounter (Signed)
Patient went to dermatologist yesterday was advised there was nothing they can do for this. Patient states he is having itching and hives that are now on face states the only thing that helps is prednisone. Dr Beaulah Dinning please advise if prednisone can be sent in.

## 2017-03-09 ENCOUNTER — Other Ambulatory Visit: Payer: Self-pay

## 2017-03-09 ENCOUNTER — Telehealth: Payer: Self-pay

## 2017-03-09 MED ORDER — PREDNISONE 10 MG PO TABS: ORAL_TABLET | ORAL | 0 refills | Status: DC

## 2017-03-09 MED ORDER — PREDNISONE 10 MG PO TABS
ORAL_TABLET | ORAL | 0 refills | Status: DC
Start: 2017-03-09 — End: 2017-07-01

## 2017-03-09 NOTE — Telephone Encounter (Signed)
Call in prednisone 10 mg tablets to take one tablet twice a day for 4 days, one tablet on the fifth day. Continue on his other medications

## 2017-03-09 NOTE — Telephone Encounter (Signed)
Sent prednisone and lm for pt to call me back regarding me doing so

## 2017-03-09 NOTE — Telephone Encounter (Signed)
Informed pt of sending in prednisone

## 2017-03-09 NOTE — Telephone Encounter (Signed)
Pt wanted to know if he should continue on doxycycline. He believes it is no longer helping with his skin. He has been on it 6 months and wants to know he can come off it?  Please advise

## 2017-03-10 NOTE — Telephone Encounter (Signed)
Informed pt of what dr B stated 

## 2017-03-10 NOTE — Telephone Encounter (Signed)
Lm for pt to call us back  

## 2017-03-10 NOTE — Telephone Encounter (Signed)
Call patient. Tell him to go off doxycycline to see how he does . Continue on all his other medications for hives

## 2017-03-11 ENCOUNTER — Encounter: Payer: Self-pay | Admitting: Family Medicine

## 2017-03-11 ENCOUNTER — Ambulatory Visit (INDEPENDENT_AMBULATORY_CARE_PROVIDER_SITE_OTHER): Payer: 59 | Admitting: Family Medicine

## 2017-03-11 VITALS — BP 136/88 | HR 83 | Temp 98.6°F | Resp 16 | Ht 63.0 in | Wt 143.0 lb

## 2017-03-11 DIAGNOSIS — F902 Attention-deficit hyperactivity disorder, combined type: Secondary | ICD-10-CM

## 2017-03-11 DIAGNOSIS — K047 Periapical abscess without sinus: Secondary | ICD-10-CM | POA: Diagnosis not present

## 2017-03-11 DIAGNOSIS — K029 Dental caries, unspecified: Secondary | ICD-10-CM | POA: Diagnosis not present

## 2017-03-11 MED ORDER — CHLORHEXIDINE GLUCONATE 0.12 % MT SOLN: 15.0000 mL | Freq: Four times a day (QID) | OROMUCOSAL | 0 refills | Status: DC

## 2017-03-11 MED ORDER — AMPHETAMINE-DEXTROAMPHET ER 30 MG PO CP24: 30.0000 mg | ORAL_CAPSULE | Freq: Every day | ORAL | 0 refills | Status: DC

## 2017-03-11 MED ORDER — IBUPROFEN 600 MG PO TABS: 600.0000 mg | ORAL_TABLET | Freq: Four times a day (QID) | ORAL | 0 refills | Status: DC | PRN

## 2017-03-11 MED ORDER — AMOXICILLIN 400 MG/5ML PO SUSR: 1000.0000 mg | Freq: Two times a day (BID) | ORAL | 0 refills | Status: DC

## 2017-03-11 NOTE — Progress Notes (Addendum)
Subjective:    Patient ID: Gary Potts, male    DOB: 07-01-1980, 36 y.o.   MRN: [id number] Chief Complaint  Patient presents with  . Abscess    in mouth/ x 1wk    HPI  Stonewall is a delightful 36 yo male here today with complaints of a dental abscess that is now improving. He is accompanied by his wife Educational psychologist. Has h/o folliculitis so went to derm and was put on doxycycline who told him that the rash would come back. So allergist did trial of 1 wk of prednisone - did prior trial of prednisone for 1 week. Allergist Dr. Stephannie Li had him taper off of doxy as thought it might be counteracting.  Thought the flu shot and cortisone shot might have reacted with the doxy. If the prednisone doesn't work this week then retry after the doxycycline course is done.  If that doesn't work, he will try a different course of attack.  Had abscess on mouth But improved since starting prednisone this week.  Has good dental insurance but he is on the waiting list to get in at dental works but he is having pain and has to take a lot of ibuprofen to counteract that and tylenol not working. Has peridontal disease, "teeth are basically uprooting themselves" - expecting to get false teeth as will eventually need to get all teeth pull.  Was causing sharp cest pain and felt drainage and pressure from lymph node down throat to lymphnode.  Can't do pills - prefers liquid antibiotic - amox.  Doing well on the klonopin 0.5 - doesn't even think about the med anymore - still has a lot left. Doesn't feel like he has had any prob decreasing and anxiety is much better as not as panicked about not functioning well at work and getting in trouble w/ boss for inefficiencies and mistakes now that ADD is being treated again. Has appt w/ psych next week at [facility]  Still seeing Nathen May at [facility] regularly who offered to get pt in w/ other psych but pt was worried it would take to long as private psychiatrists  can take 1-2 mos or more until an appt and he didn't think she could wait that long initially but is definitely feeling much more stable on current regimen.  Past Medical History:  Diagnosis Date  . Anxiety   . Migraines   . Rash   . Urticaria    Past Surgical History:  Procedure Laterality Date  . CHOLECYSTECTOMY  2014  . OOPHORECTOMY Right 2005   Current Outpatient Prescriptions on File Prior to Visit  Medication Sig Dispense Refill  . albuterol (PROAIR HFA) 108 (90 Base) MCG/ACT inhaler Inhale 2 puffs into the lungs every 4 (four) hours as needed for wheezing or shortness of breath. 1 Inhaler 1  . clindamycin (CLEOCIN T) 1 % lotion     . clonazePAM (KLONOPIN) 0.5 MG tablet Take 1 tablet (0.5 mg total) by mouth 2 (two) times daily as needed for anxiety. 60 tablet 0  . diclofenac sodium (VOLTAREN) 1 % GEL Apply 1 application topically 2 (two) times daily as needed.    . dicyclomine (BENTYL) 10 MG capsule     . doxycycline (VIBRAMYCIN) 100 MG capsule Take by mouth daily.     Marland Kitchen EPINEPHrine (EPIPEN 2-PAK) 0.3 mg/0.3 mL IJ SOAJ injection Use as directed for severe allergic reaction. 2 Device 1  . fluticasone (FLONASE) 50 MCG/ACT nasal spray 2 sprays per  nostril at night for stuffy nose. 16 g 5  . Gabapentin 300 MG/6ML SOLN Take 6 mLs by mouth at bedtime.    . hydrOXYzine (ATARAX/VISTARIL) 50 MG tablet Take 1 tablet (50 mg total) by mouth at bedtime as needed. 30 tablet 3  . levocetirizine (XYZAL) 5 MG tablet Take 1 tablet (5 mg total) by mouth every evening. 30 tablet 5  . montelukast (SINGULAIR) 10 MG tablet One tablet by mouth once a day for coughing or wheezing. 34 tablet 5  . predniSONE (DELTASONE) 10 MG tablet 1 TABLET TWICE A DAY FOR 4 DAYS AND THEN 1 TABLET ON DAY 5 9 tablet 0  . ranitidine (ZANTAC) 150 MG tablet Take one tablet by mouth twice a day for itching. It may help heartburn. 60 tablet 5  . testosterone cypionate (DEPOTESTOSTERONE CYPIONATE) 200 MG/ML injection Inject 0.4  mLs into the muscle once a week.     Current Facility-Administered Medications on File Prior to Visit  Medication Dose Route Frequency Provider Last Rate Last Dose  . omalizumab [name]) injection 300 mg  300 mg Subcutaneous Q28 days Stephannie Li A, MD   300 mg at 03/02/17 0844   Allergies  Allergen Reactions  . Penicillins Anaphylaxis and Rash    Pt can tolerate amoxicillin  Has patient had a PCN reaction causing immediate rash, facial/tongue/throat swelling, SOB or lightheadedness with hypotension: no  Has patient had a PCN reaction causing severe rash involving mucus membranes or skin necrosis: no Has patient had a PCN reaction that required hospitalization: unknown Has patient had a PCN reaction occurring within the last 10 years: no If all of the above answers are "NO", then may proceed with Cephalosporin use.   . Shellfish Allergy Anaphylaxis  . Latex Rash   Family History  Problem Relation Age of Onset  . Allergic rhinitis Mother   . Sinusitis Mother   . Sinusitis Sister   . Urticaria Sister   . Angioedema Neg Hx   . Asthma Neg Hx   . Eczema Neg Hx   . Immunodeficiency Neg Hx    Social History   Social History  . Marital status: Married    Spouse name: N/A  . Number of children: N/A  . Years of education: N/A   Social History Main Topics  . Smoking status: Never Smoker  . Smokeless tobacco: Never Used  . Alcohol use Yes  . Drug use: No  . Sexual activity: Yes   Other Topics Concern  . None   Social History Narrative  . None   Depression screen [facility] 2/9 03/11/2017 02/22/2017  Decreased Interest 0 0  Down, Depressed, Hopeless 0 0  PHQ - 2 Score 0 0     Review of Systems  Constitutional: Negative for activity change, appetite change, chills, diaphoresis, fever and unexpected weight change.  HENT: Positive for dental problem, mouth sores and trouble swallowing (a little painful but still eating well). Negative for congestion, drooling, ear discharge, ear  pain, facial swelling, postnasal drip, sore throat, tinnitus and voice change.   Respiratory: Negative for shortness of breath.   Cardiovascular: Negative for chest pain.  Gastrointestinal: Negative for abdominal pain, nausea and vomiting.  Musculoskeletal: Negative for gait problem.  Skin: Positive for color change. Negative for pallor and rash.  Allergic/Immunologic: Negative for immunocompromised state.  Hematological: Positive for adenopathy. Does not bruise/bleed easily.  Psychiatric/Behavioral: Positive for decreased concentration. Negative for agitation, behavioral problems, confusion and dysphoric mood. The patient is not nervous/anxious.  Objective:   Physical Exam  Constitutional: She appears well-developed and well-nourished. No distress.  HENT:  Head: Normocephalic and atraumatic.  Right Ear: Tympanic membrane, external ear and ear canal normal.  Left Ear: Tympanic membrane, external ear and ear canal normal.  Nose: Mucosal edema and rhinorrhea present. Right sinus exhibits no maxillary sinus tenderness. Left sinus exhibits no maxillary sinus tenderness.  Mouth/Throat: Oropharynx is clear and moist. Oral lesions (left upper posterior gum wiht erythema and edema, no focal pustules, nodules, or drainage, no definitive "walled-off' abscess) present. Abnormal dentition. Dental caries present. No oropharyngeal exudate.  Eyes: Conjunctivae and EOM are normal. Right eye exhibits no discharge. Left eye exhibits no discharge.  Neck: Normal range of motion. Neck supple. No tracheal deviation present. No thyromegaly present.  Lymphadenopathy:       Head (right side): No submental, no submandibular, no tonsillar, no preauricular, no posterior auricular and no occipital adenopathy present.       Head (left side): Tonsillar adenopathy present. No submental, no submandibular, no preauricular, no posterior auricular and no occipital adenopathy present.    She has cervical adenopathy.        Right cervical: No superficial cervical, no deep cervical and no posterior cervical adenopathy present.      Left cervical: Superficial cervical adenopathy present. No deep cervical and no posterior cervical adenopathy present.  Skin: She is not diaphoretic.       BP 136/88   Pulse 83   Temp 98.6 F (37 C) (Oral)   Resp 16   Ht  (1.6 m)   Wt 143 lb (64.9 kg)   SpO2 97%   BMI 25.33 kg/m   Assessment & Plan:  Requests Diflucan as often gets vaginal yeast infections after antibiotic course.  1. Dental abscess - has an appt with dentist in about 2 wks, currently improving since he was put on prednisone by his allergist for an atypical folliculitis rash so start the antibacterial mouthwash peridex (has used this or something similar in past with good result.)  Will start antibiotic if sxs recur when pred is completed - try to ensure he uses it at least for the 5d prior to his dental appt so that if he doesn't have an active infxn they be more likely to do any needed procedure at that time. Pt states he tolerates amox well despite pcn allergy though requests liquid - difficulty swallowing any large pills w/ current sxs  2. Dental caries into pulp   3. Attention deficit hyperactivity disorder (ADHD), combined type - pt certainly seems very ADHD on exam though is somewhat better today but still easily distractable, talks quickly and isn't great about finishing one topic before moving onto the next but still a definite improvement over these same sxs last visit. Refilled Adderall XR 30 for an additional mo. Advised pt that perhaps [facility] might not be the best place for him to establish psychiatric care as he would like to maintain on stimulant therapy and poss bzd  - low dose klonopin and they do not rx any controlled meds and often have freq provider turn over/skype providers - pt agrees that he would be disaastified with that so will get referral to private psychiatric group from his  therapist Edson Snowball at [facility]. Aware to call if he wants referral from me placed through ins or recs. If he cannot get in to see psych prior to Dec, will need OV for medication refills - if treatment is going to  by any more than 1-2 mos, needs UDS.     Meds ordered this encounter  Medications  . ibuprofen (ADVIL,MOTRIN) 600 MG tablet    Sig: Take 1 tablet (600 mg total) by mouth every 6 (six) hours as needed.    Dispense:  60 tablet    Refill:  0  . amoxicillin (AMOXIL) 400 MG/5ML suspension    Sig: Take 12.5 mLs (1,000 mg total) by mouth 2 (two) times daily.    Dispense:  250 mL    Refill:  0  . chlorhexidine (PERIDEX) 0.12 % solution    Sig: Use as directed 15 mLs in the mouth or throat 4 (four) times daily. Swish, gargle, and spit    Dispense:  2000 mL    Refill:  0  . amphetamine-dextroamphetamine (ADDERALL XR) 30 MG 24 hr capsule    Sig: Take 1 capsule (30 mg total) by mouth daily.    Dispense:  30 capsule    Refill:  0    May fill on or after 03/23/2017   Greater than 50% of the 25 minute visit was spent in counseling/coordination of care regarding folliculitis, mult antibiotics, prolonged abx therapy, psychiatric care.  Norberto Sorenson, M.D.  Primary Care at [facility] [address] [place], [place] [address] [phone] phone [phone] fax  03/14/17 11:00 AM

## 2017-03-11 NOTE — Patient Instructions (Addendum)
IF you received an x-ray today, you will receive an invoice from [facility]. Please contact [facility] at [phone] with questions or concerns regarding your invoice.   IF you received labwork today, you will receive an invoice from [place]. Please contact [facility] at [phone] with questions or concerns regarding your invoice.   Our billing staff will not be able to assist you with questions regarding bills from these companies.  You will be contacted with the lab results as soon as they are available. The fastest way to get your results is to activate your My Chart account. Instructions are located on the last page of this paperwork. If you have not heard from [place] regarding the results in 2 weeks, please contact this office.     Diet and Dental Disease What you eat affects the health of your teeth. Choosing the right foods can help you to keep your teeth healthy and avoid dental problems, such as tooth decay and gum disease. You need to know which foods can help to keep your teeth strong and which foods can cause problems for your teeth. If you do not get the proper nutrients in your diet, your body may be less able to prevent dental disease. Practicing good oral hygiene is very important in maintaining healthy teeth and gums. This includes brushing your teeth twice daily with fluoride toothpaste, flossing between your teeth daily, and visiting your dentist regularly to have your teeth cleaned professionally. What general guidelines do I need to follow? Food Guidelines  Eat a healthy, well-balanced diet that includes a variety of foods in moderate amounts every day. Eat less of foods that contain high amounts of sugar or easily digested (refined), simple carbohydrates. Your diet should include: ? Fiber-rich fruits and vegetables. ? Lean sources of protein. These include eggs, lean meat, chicken, fish, and low-fat or fat-free dairy products. ? Whole grains. These  include brown rice, whole-wheat bread, and pasta. ? Fresh, whole, and unprocessed foods. These foods help you to produce more saliva, which is important for good dental health.  Try to wait at least 2 hours between meals, snacks, and drinks.  Avoid foods that are rich in simple sugars or carbohydrates, such as candies, cakes, and syrups. Eating these foods often over a long period of time can increase your risk of developing cavities (dental caries).  Avoid eating sticky or acidic foods by themselves, such as caramel, dried fruit, jams, or citrus fruits. If you eat these types of foods, eat them with meals rather than between meals.  Chew sugar-free gum right after a meal or snack. Beverage Guidelines  Rinse your mouth with water after eating sugary snacks.  Avoid sipping drinks that are sweetened with sugar, such as soda. Sipping these drinks for prolonged periods can increase your risk of dental caries.  Avoid holding or swishing acidic or sugary drinks in your mouth. What foods may increase my risk of dental problems?  Any food or drink that contains sugar or is sweetened with sugar. These should be avoided in general or only eaten in moderation. This list includes fruit drinks, carbonated beverages, sport or energy drinks, as well as sweetened coffees and teas.  Sticky foods. These include raisins, other dried fruit, and candies that are sticky, hard, or slow to melt. It is best to avoid these.  Foods that are high in refined carbohydrates or higher in sugar. These include cookies, cakes, muffins, chips, and crackers.  Simple sugars, such as sucrose, honey,  and molasses.  Acidic foods and drinks. These include tomatoes, citrus fruits, coffee, and fruit juice. These foods and drinks can weaken tooth enamel, and that can cause tooth sensitivity, erosion, and pitting. The items listed above may not be a complete list of foods and beverages to limit or avoid. Contact your dietitian for  more information. What foods may decrease my risk of dental problems?  Foods that are high in vitamins C and A, such as fresh fruits and vegetables. These vitamins are important for keeping gums healthy and building tooth enamel.  High-quality protein foods. These include lean meats, eggs, cheese, milk, yogurt, fish, beans, and legumes.  Whole-grain breads and cereals that are low in sugar.  Sugar-free chewing gum and sugar-free mints.  Foods that are good sources of calcium. These include cheese, milk, plain yogurt, calcium-fortified tofu, leafy greens, and almonds.  Water, especially fluoridated water. The items listed above may not be a complete list of recommended foods or beverages. Contact your dietitian for more options. This information is not intended to replace advice given to you by your health care provider. Make sure you discuss any questions you have with your health care provider. Document Released: 02/02/2011 Document Revised: 11/12/2015 Document Reviewed: 11/05/2013 Elsevier Interactive Patient Education  2017 [employer].

## 2017-03-14 MED ORDER — FLUCONAZOLE 150 MG PO TABS: 150.0000 mg | ORAL_TABLET | Freq: Once | ORAL | 0 refills | Status: AC

## 2017-03-15 ENCOUNTER — Telehealth: Payer: Self-pay

## 2017-03-15 NOTE — Telephone Encounter (Signed)
Pt had it last filled 02-27-2017 at [facility] 60g bottle  With instructions to apply it to affected areas bid as needed. Insurance will not pay for it to be filled again until the 4th.

## 2017-03-15 NOTE — Telephone Encounter (Signed)
Find out which pharmacy he  used to fill the clindamycin lotion and instructions

## 2017-03-15 NOTE — Telephone Encounter (Signed)
Pt is doing somewhat better without the doxycycline. He stated the prednisone was helping until he went back to work Monday. Then the hives came back. He states he has the itching under control, but does not have the bumps under control.He was wondering if we could send in the clindamycin lotion for him as he is not seeing the original pre scriber of that medication any more?  Please advise

## 2017-03-17 ENCOUNTER — Other Ambulatory Visit: Payer: Self-pay

## 2017-03-17 MED ORDER — CLINDAMYCIN PHOSPHATE 1 % EX LOTN: TOPICAL_LOTION | Freq: Two times a day (BID) | CUTANEOUS | 2 refills | Status: DC

## 2017-03-17 NOTE — Telephone Encounter (Signed)
Informed pt of sending in the lotion

## 2017-03-17 NOTE — Telephone Encounter (Signed)
Sent in clindomycin lotion will call pt about doing so

## 2017-03-17 NOTE — Telephone Encounter (Signed)
Call in clindamycin lotion  Apply twice a day if needed.. 60 grams and 2 refills. Patient will call when needed. Let patient know

## 2017-03-26 ENCOUNTER — Encounter: Payer: Self-pay | Admitting: Family Medicine

## 2017-03-26 NOTE — Progress Notes (Deleted)
Subjective:    Patient ID: [facility], unknown    DOB: 1980/10/28, 36 y.o.   MRN: [id number]  HPI  Gary Potts is a delightful 36 yo trans-male here today for a 1 mo follow-up on his ADD and anxiety. He just established care here 1 mo prior at which point we made significant changes to his medication regimen to reflect what he had done better on prior. Pt's care is complicated by the fact that he has NUMEROUS specialists and moved from [place] earlier this year requiring all new numerous specialists. Also, presented with florid ADHD sxs not currently receiving treatment which also made this hx difficult to collect (lots of information coming at me fast in a somewhat disjointed/tangential manner).  Mental Health Follows closely with therapist Dr. Harless Litten at [facility] since 06/2016.  Had appt to establish w/ psychiatry at [facility] last month but I advised Hardin to cancel and est w/ private psychiatrist rec by his therapist as [facility] does not rx any controlled meds and pt has responded well to resuming stimulant therapy for ADHD and has also been on BZDs for years - I am providing medication management until he is est w/ psych.  ADHD: Dx'd at 36 yo and started on stimulant therapy w/ Ritalin at 36 yo. 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 More recently was managed by Dallie Piles at [facility] in [place], [place]. However, presented here last mos 02/22/17 on rec of his therapist Dr. Roger Shelter when he was at risk of loosing his job due to inefficiencies and mistakes so I started pt on Adderall XR  qam 9/5 which he reported at f/u OV 2 wks later was working well so refilled 10/4 (not yet seen in database).  Failed Vyvanse prior (ineffective).  [place] Drug Database - PMP Aware has integrated to pull in same rx info when run under "[facility]" or "[facility].  Database contains info over the past 6 yrs so since 03/2011, first stimulant rx vyvanse   10/4012 (clonazepam 0.5 bid was weaned down to qd). Started seeing Dallie Piles 03/2013 on same until 11/2013 after which was rx'ed adderall 10 qd once, then Evekeo (amphetamine) 10 bid once (04/2014). Anxiety: On klonopin. When we started adderall on 9/5, dose decreased to klonopin 0.5mg  bid which pt reports had been more than sufficienct. Review of [place] Drug database PMP aware for the past 2 yrs reveals that at that time pt was being rx'ed clonazepam 0.5 mg bid by Dallie Piles which was increased to tid 07/2015 (sev mos after stopping stimulant), then increased to  tid 12/2015.  Failed valium  bid + clonazepam  qd 03/01/2016 - pt reports increased agitation w/ this - so 05/23/16 was changed to clonazepam  tid though then fills widened to q6 wks - last filled from [place] 8/16 clonazepam  tid #90, then 02/22/17 I started pt on clonzepam 0.5mg  bid #60 no refills.  Chronic urticaria: Sig worsens over summer. Follows closely w Dr. Beaulah Dinning at [facility]. Does carry Epi-pen. Monthly Xolair inj started 03/02/17. Levocetirizine 5 bid, ranitidine 150 bid, gabapentin  (liquid) qhs, montelukast  prn cough/wheeze, flonase prn, proair prn, Hydroxyzine  qhs prn itching, topical skin ointment, clindamycin 1% bid. Still has breakouts - could be triggered by a shower/bath even - so occ courses of prednisone - last started  bid x 4d 9/12,  Recurrent Folliculitis: S/p 6 mos of doxycyline   qd for folliculitis from derm stopped 9/21 as pt felt no longer effective.  Endocrine disorder: Followed by Dr. Ruby Cola in [place] - on testosterone cypionate  (=0.20mL of /mL) qwk inj. Prior to his transfer of care to Dr. Ruby Cola earlier this year, his testosterone dose had been sig higher which was causing sig adverse mental health side effects but sxs improved on current dose. Per [place] Controlled drug database - PMP Aware - pt started on testosterone  01/26/2016 by Almedia Balls  Weight gain: Suspected due to Testosterone but now dose lowered and restarted on stimulant therapy has been pleased with recent weigh loss getting back to his baseline. Started on xolair mid Sept. On hydroxyzine  qhs prn itching.  PCP: Prev followed at [facility]. Transferred care here 02/22/17 on rec of his therapist.  Right shoulder pain and sternal contusion: Failed meloxciam Saw orthopedic surgeon Dr. Delena Serve in [facility] for Kenalog inj 01/2917 but states it did not provide any relief and caused sensation of itching/burning followed by [facility] sev d later so allergist rec hydroxyzine  qhs per pt (had actually rec hydroxyzine  qhs with cetirizine  qam).  Rt shoulder MRI 9/19 showed mild rotator cuff tendinopathy/tendinosis (no partial tear) and muscle strain or partial tear of the posterior aspect of the supraspinatus muscle so rec full duty, no rx, activities as tolerated, f/u prn.  Has topical voltaren gel.  Migraines:  benyl ??  Review of Systems     Objective:   Physical Exam        Assessment & Plan:  Needs klonopin 0.5 bid refill - ran out 10/5??? (prob had higher dose left at home from prior rxs??)  Today I have utilized the [place] Controlled Substance Registry's online query to confirm compliance regarding the patient's controlled medications. My review reveals appropriate prescription fills and that I am currently sole provider of any controlled medications. Rechecks will occur regularly and the patient is aware of our use of the system.

## 2017-03-27 ENCOUNTER — Ambulatory Visit: Payer: 59 | Admitting: Family Medicine

## 2017-03-27 ENCOUNTER — Telehealth: Payer: Self-pay | Admitting: Allergy

## 2017-03-27 NOTE — Telephone Encounter (Signed)
Dr. Beaulah Dinning, [place] called and said the rash has gone from her back to the front of her chest. Said she didn't think she was on enough prednisone . She said it was really bad.

## 2017-03-28 ENCOUNTER — Telehealth: Payer: Self-pay | Admitting: Family Medicine

## 2017-03-28 NOTE — Telephone Encounter (Signed)
Gary Potts is a delightful 36 yo trans-male here today for a 1 mo follow-up on his ADD and anxiety. He just established care here 1 mo prior at which point we made significant changes to his medication regimen to reflect what he had done better on prior. Pt's care is complicated by the fact that he has NUMEROUS specialists and moved from [place] earlier this year requiring all new numerous specialists. Also, presented with florid ADHD sxs not currently receiving treatment which also made this hx difficult to collect (lots of information coming at me fast in a somewhat disjointed/tangential manner).  Mental Health Follows closely with therapist Dr. Harless Litten at [facility] since 06/2016.  Had appt to establish w/ psychiatry at [facility] last month but I advised [name] to cancel and est w/ private psychiatrist rec by his therapist as Vesta Mixer does not rx any controlled meds and pt has responded well to resuming stimulant therapy for ADHD and has also been on BZDs for years - I am providing medication management until he is est w/ psych.  ADHD: Dx'd at 36 yo and started on stimulant therapy w/ Ritalin at 36 yo. 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 More recently was managed by Dallie Piles at [facility] in [place], [place]. However, presented here last mos 02/22/17 on rec of his therapist Dr. Roger Shelter when he was at risk of loosing his job due to inefficiencies and mistakes so I started pt on Adderall XR  qam 9/5 which he reported at f/u OV 2 wks later was working well so refilled 10/4 (not yet seen in database).  Failed Vyvanse prior (ineffective).  [place] Drug Database - PMP Aware has integrated to pull in same rx info when run under "[facility]" or "[facility].  Database contains info over the past 6 yrs so since 03/2011, first stimulant rx vyvanse  10/4012 (clonazepam 0.5 bid was weaned down to qd). Started seeing Dallie Piles 03/2013 on same  until 11/2013 after which was rx'ed adderall 10 qd once, then Evekeo (amphetamine) 10 bid once (04/2014). Anxiety: On klonopin. When we started adderall on 9/5, dose decreased to klonopin 0.5mg  bid which pt reports had been more than sufficienct. Review of [place] Drug database PMP aware for the past 2 yrs reveals that at that time pt was being rx'ed clonazepam 0.5 mg bid by Dallie Piles which was increased to tid 07/2015 (sev mos after stopping stimulant), then increased to  tid 12/2015.  Failed valium  bid + clonazepam  qd 03/01/2016 - pt reports increased agitation w/ this - so 05/23/16 was changed to clonazepam  tid though then fills widened to q6 wks - last filled from [place] 8/16 clonazepam  tid #90, then 02/22/17 I started pt on clonzepam 0.5mg  bid #60 no refills.  Chronic urticaria: Sig worsens over summer. Follows closely w Dr. Beaulah Dinning at [facility]. Does carry Epi-pen. Monthly Xolair inj started 03/02/17. Levocetirizine 5 bid, ranitidine 150 bid, gabapentin  (liquid) qhs, montelukast  prn cough/wheeze, flonase prn, proair prn, Hydroxyzine  qhs prn itching, topical skin ointment, clindamycin 1% bid. Still has breakouts - could be triggered by a shower/bath even - so occ courses of prednisone - last started  bid x 4d 9/12,  Recurrent Folliculitis: S/p 6 mos of doxycyline  qd for folliculitis from derm stopped 9/21 as pt felt no longer effective.  Endocrine disorder: Followed by Dr. Ruby Cola in [place] -  on testosterone cypionate  (=0.43mL of /mL) qwk inj. Prior to his transfer of care to Dr. Ruby Cola earlier this year, his testosterone dose had been sig higher which was causing sig adverse mental health side effects but sxs improved on current dose. Per [place] Controlled drug database - PMP Aware - pt started on testosterone 01/26/2016 by Almedia Balls  Weight gain: Suspected due to Testosterone but now dose lowered and  restarted on stimulant therapy has been pleased with recent weigh loss getting back to his baseline. Started on xolair mid Sept. On hydroxyzine  qhs prn itching.  PCP: Prev followed at [facility]. Transferred care here 02/22/17 on rec of his therapist.  Right shoulder pain and sternal contusion: Failed meloxciam Saw orthopedic surgeon Dr. Delena Serve in [facility] for Kenalog inj 01/2917 but states it did not provide any relief and caused sensation of itching/burning followed by [facility] sev d later so allergist rec hydroxyzine  qhs per pt (had actually rec hydroxyzine  qhs with cetirizine  qam).  Rt shoulder MRI 9/19 showed mild rotator cuff tendinopathy/tendinosis (no partial tear) and muscle strain or partial tear of the posterior aspect of the supraspinatus muscle so rec full duty, no rx, activities as tolerated, f/u prn.  Has topical voltaren gel.  Migraines:  benyl ??

## 2017-03-28 NOTE — Telephone Encounter (Signed)
Call the patient. Find out if the rash is due to hives. Continue on all his medications. If he is going to need prednisone all the time , he will need to be  evaluated at the [facility]. Continue on Xolair for a six-month course

## 2017-03-28 NOTE — Telephone Encounter (Signed)
Rash starts then turns into hives, he is currently on all meds except doxycyline. He feels like the ointments are making it worse so he stopped those. He believes it is due to the hairs from the testosterone that are growing but not totattaly sure.

## 2017-03-29 ENCOUNTER — Ambulatory Visit (INDEPENDENT_AMBULATORY_CARE_PROVIDER_SITE_OTHER): Payer: 59 | Admitting: *Deleted

## 2017-03-29 DIAGNOSIS — L500 Allergic urticaria: Secondary | ICD-10-CM

## 2017-03-29 NOTE — Telephone Encounter (Signed)
He should be seen by her dermatologist, particularly at the [facility] dermatology, regarding chest rash and what to do if he wants to continue on testosterone

## 2017-03-30 ENCOUNTER — Ambulatory Visit: Payer: Self-pay

## 2017-03-30 NOTE — Telephone Encounter (Signed)
Informed pt of what dr B has stated. He will call pcp about referral to [facility].

## 2017-03-30 NOTE — Telephone Encounter (Signed)
Lm for pt to call us back  

## 2017-04-10 ENCOUNTER — Ambulatory Visit: Payer: Self-pay | Admitting: Pediatrics

## 2017-04-11 ENCOUNTER — Telehealth: Payer: Self-pay | Admitting: Family Medicine

## 2017-04-14 MED ORDER — AMOXICILLIN 400 MG/5ML PO SUSR: 1000.0000 mg | Freq: Two times a day (BID) | ORAL | 0 refills | Status: DC

## 2017-04-14 NOTE — Telephone Encounter (Signed)
Would you like to refill? 

## 2017-04-14 NOTE — Telephone Encounter (Signed)
Sent to [facility] on [address].  

## 2017-04-26 ENCOUNTER — Ambulatory Visit: Payer: Self-pay

## 2017-05-01 ENCOUNTER — Ambulatory Visit (INDEPENDENT_AMBULATORY_CARE_PROVIDER_SITE_OTHER): Payer: Self-pay

## 2017-05-01 DIAGNOSIS — L500 Allergic urticaria: Secondary | ICD-10-CM

## 2017-05-10 ENCOUNTER — Telehealth: Payer: Self-pay | Admitting: *Deleted

## 2017-05-10 NOTE — Telephone Encounter (Signed)
Called patient to find out if he has insurance coverage.  His [employer] shows termed eff 04/19/17

## 2017-05-29 ENCOUNTER — Ambulatory Visit: Payer: Self-pay

## 2017-06-20 DIAGNOSIS — K7689 Other specified diseases of liver: Secondary | ICD-10-CM

## 2017-06-20 HISTORY — DX: Other specified diseases of liver: K76.89

## 2017-06-22 ENCOUNTER — Encounter: Payer: Self-pay | Admitting: Family Medicine

## 2017-06-22 DIAGNOSIS — D751 Secondary polycythemia: Secondary | ICD-10-CM | POA: Diagnosis not present

## 2017-06-22 DIAGNOSIS — K719 Toxic liver disease, unspecified: Secondary | ICD-10-CM | POA: Diagnosis not present

## 2017-06-22 DIAGNOSIS — E281 Androgen excess: Secondary | ICD-10-CM | POA: Diagnosis not present

## 2017-06-24 ENCOUNTER — Emergency Department (HOSPITAL_COMMUNITY)
Admission: EM | Admit: 2017-06-24 | Discharge: 2017-06-24 | Disposition: A | Discharge status: Home / Self Care | Payer: BLUE CROSS/BLUE SHIELD | Attending: Emergency Medicine | Admitting: Emergency Medicine

## 2017-06-24 ENCOUNTER — Emergency Department (HOSPITAL_COMMUNITY): Payer: BLUE CROSS/BLUE SHIELD

## 2017-06-24 DIAGNOSIS — Z91013 Allergy to seafood: Secondary | ICD-10-CM | POA: Diagnosis not present

## 2017-06-24 DIAGNOSIS — R079 Chest pain, unspecified: Secondary | ICD-10-CM

## 2017-06-24 DIAGNOSIS — F410 Panic disorder [episodic paroxysmal anxiety] without agoraphobia: Secondary | ICD-10-CM

## 2017-06-24 DIAGNOSIS — Z91040 Latex allergy status: Secondary | ICD-10-CM | POA: Diagnosis not present

## 2017-06-24 DIAGNOSIS — Z880 Allergy status to penicillin: Secondary | ICD-10-CM | POA: Insufficient documentation

## 2017-06-24 DIAGNOSIS — Z79899 Other long term (current) drug therapy: Secondary | ICD-10-CM | POA: Insufficient documentation

## 2017-06-24 DIAGNOSIS — R0789 Other chest pain: Secondary | ICD-10-CM | POA: Diagnosis not present

## 2017-06-24 LAB — I-STAT TROPONIN, ED: Troponin i, poc: 0 ng/mL (ref 0.00–0.08)

## 2017-06-24 LAB — D-DIMER, QUANTITATIVE: D-Dimer, Quant: 0.39 ug/mL-FEU (ref 0.00–0.50)

## 2017-06-24 MED ORDER — MORPHINE SULFATE (PF) 4 MG/ML IV SOLN
4.0000 mg | INTRAVENOUS | Status: DC | PRN
  Administered 2017-06-24: 4 mg via INTRAVENOUS
  Filled 2017-06-24: qty 1

## 2017-06-24 MED ORDER — ONDANSETRON HCL 4 MG/2ML IJ SOLN
4.0000 mg | Freq: Once | INTRAMUSCULAR | Status: AC
  Administered 2017-06-24: 4 mg via INTRAVENOUS
  Filled 2017-06-24: qty 2

## 2017-06-24 MED ORDER — SODIUM CHLORIDE 0.9 % IV BOLUS (SEPSIS)
1000.0000 mL | Freq: Once | INTRAVENOUS | Status: AC
  Administered 2017-06-24: 1000 mL via INTRAVENOUS

## 2017-06-24 NOTE — ED Triage Notes (Signed)
Pt reports he went to the gym today and felt short of breath. Pt thought it was indigestion and continues to work out.  Pt reports the pain will come and go but the pain is a dull squeezing. Pt also reports that his arms are numb and tingling. Pt states this has never happened to him before.

## 2017-06-24 NOTE — ED Notes (Signed)
In xray

## 2017-06-24 NOTE — Discharge Instructions (Signed)
Follow-up with her primary care physician as needed

## 2017-06-24 NOTE — ED Provider Notes (Signed)
[facility]-EMERGENCY DEPT Provider Note   CSN: [id number] Arrival date & time: 06/24/17  1836     History   Chief Complaint Chief Complaint  Patient presents with  . Shortness of Breath  . Chest Pain    HPI Gary Potts is a 37 y.o. adult. Chief complaint is chest pain.  HPI 37 year old male. History of anxiety. History of urticaria. Takes antihistamines. Has had panic attacks with chest pain in the past. States he waking this morning with tightness in his chest. With the gym became severely worse. States he cannot take deep breath without feeling severe tightness in his chest.  Presents here dramatic, anxious, painful.  Past Medical History:  Diagnosis Date  . Anxiety   . Migraines   . Rash   . Urticaria     Patient Active Problem List   Diagnosis Date Noted  . Chronic urticaria 01/23/2017  . 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 Reactive airway disease 12/30/2016  . Other allergic rhinitis 12/30/2016  . 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 CHOLECYSTECTOMY  2014  . OOPHORECTOMY Right 2005    OB History    No data available       Home Medications    Prior to Admission medications   Medication Sig Start Date End Date Taking? Authorizing Provider  albuterol (PROAIR HFA) 108 (90 Base) MCG/ACT inhaler Inhale 2 puffs into the lungs every 4 (four) hours as needed for wheezing or shortness of breath. 12/30/16  Yes Bardelas, Bonnita HollowJose A, MD  Ascorbic Acid (VITAMIN C) 500 MG CHEW Chew 500 mg by mouth daily.   Yes [provider]  clonazePAM (KLONOPIN) 0.5 MG tablet Take 1 tablet (0.5 mg total) by mouth 2 (two) times daily as needed for anxiety. 02/22/17  Yes Sherren MochaShaw, Eva N, MD  fluticasone (FLONASE) 50 MCG/ACT nasal spray 2 sprays per nostril at night for stuffy nose. 12/30/16  Yes Bardelas, Bonnita HollowJose A, MD  hydrOXYzine (ATARAX/VISTARIL) 50 MG tablet Take 1 tablet (50 mg total) by mouth  at bedtime as needed. 02/24/17  Yes Bardelas, Bonnita HollowJose A, MD  ibuprofen (ADVIL,MOTRIN) 200 MG tablet Take 400 mg by mouth every 6 (six) hours as needed for moderate pain.   Yes [provider]  ranitidine (ZANTAC) 150 MG tablet Take one tablet by mouth twice a day for itching. It may help heartburn. 12/30/16  Yes Bardelas, Bonnita HollowJose A, MD  testosterone cypionate (DEPOTESTOSTERONE CYPIONATE) 200 MG/ML injection Inject 0.4 mLs into the muscle once a week. 08/31/16  Yes [provider]  amoxicillin (AMOXIL) 400 MG/5ML suspension Take 12.5 mLs (1,000 mg total) by mouth 2 (two) times daily. Patient not taking: Reported on 06/24/2017 04/14/17   Sherren MochaShaw, Eva N, MD  amphetamine-dextroamphetamine (ADDERALL XR) 30 MG 24 hr capsule Take 1 capsule (30 mg total) by mouth daily. Patient not taking: Reported on 06/24/2017 03/11/17   Sherren MochaShaw, Eva N, MD  chlorhexidine (PERIDEX) 0.12 % solution Use as directed 15 mLs in the mouth or throat 4 (four) times daily. Swish, gargle, and spit Patient not taking: Reported on 06/24/2017 03/11/17   Sherren MochaShaw, Eva N, MD  clindamycin (CLEOCIN T) 1 % lotion Apply topically 2 (two) times daily. Patient not taking: Reported on 06/24/2017 03/17/17   Fletcher AnonBardelas, Jose A, MD  EPINEPHrine (EPIPEN 2-PAK) 0.3 mg/0.3 mL IJ SOAJ injection Use as directed for severe allergic reaction. 12/30/16   Fletcher AnonBardelas, Jose A, MD  levocetirizine (XYZAL) 5 MG  tablet Take 1 tablet (5 mg total) by mouth every evening. Patient not taking: Reported on 06/24/2017 03/01/17   Bobbitt, Heywood Iles, MD  montelukast (SINGULAIR) 10 MG tablet One tablet by mouth once a day for coughing or wheezing. Patient not taking: Reported on 06/24/2017 12/30/16   Fletcher Anon, MD  predniSONE (DELTASONE) 10 MG tablet 1 TABLET TWICE A DAY FOR 4 DAYS AND THEN 1 TABLET ON DAY 5 Patient not taking: Reported on 06/24/2017 03/09/17   Fletcher Anon, MD    Family History Family History  Problem Relation Age of Onset  . Allergic rhinitis Mother   .  Sinusitis Mother   . Sinusitis Sister   . Urticaria Sister   . Angioedema Neg Hx   . Asthma Neg Hx   . Eczema Neg Hx   . Immunodeficiency Neg Hx     Social History Social History   Tobacco Use  . Smoking status: Never Smoker  . Smokeless tobacco: Never Used  Substance Use Topics  . Alcohol use: Yes  . Drug use: No     Allergies   Penicillins; Shellfish allergy; and Latex   Review of Systems Review of Systems  Constitutional: Negative for appetite change, chills, diaphoresis, fatigue and fever.  HENT: Negative for mouth sores, sore throat and trouble swallowing.   Eyes: Negative for visual disturbance.  Respiratory: Negative for cough, chest tightness, shortness of breath and wheezing.   Cardiovascular: Positive for chest pain.  Gastrointestinal: Negative for abdominal distention, abdominal pain, diarrhea, nausea and vomiting.  Endocrine: Negative for polydipsia, polyphagia and polyuria.  Genitourinary: Negative for dysuria, frequency and hematuria.  Musculoskeletal: Negative for gait problem.  Skin: Negative for color change, pallor and rash.  Neurological: Negative for dizziness, syncope, light-headedness and headaches.  Hematological: Does not bruise/bleed easily.  Psychiatric/Behavioral: Negative for behavioral problems and confusion. The patient is nervous/anxious.      Physical Exam Updated Vital Signs BP 135/81   Pulse 75   Temp 98 F (36.7 C) (Oral)   Resp 20   SpO2 100%   Physical Exam  Constitutional: He is oriented to person, place, and time. He appears well-developed and well-nourished. No distress.  Anxious. Hyperventilating.  HENT:  Head: Normocephalic.  Eyes: Conjunctivae are normal. Pupils are equal, round, and reactive to light. No scleral icterus.  Neck: Normal range of motion. Neck supple. No thyromegaly present.  Cardiovascular: Normal rate and regular rhythm. Exam reveals no gallop and no friction rub.  No murmur heard. Pulmonary/Chest:  Effort normal and breath sounds normal. No respiratory distress. He has no wheezes. He has no rales.  Hyperventilating. Effort was. Clear lungs.  Abdominal: Soft. Bowel sounds are normal. He exhibits no distension. There is no tenderness. There is no rebound.  Musculoskeletal: Normal range of motion.  Neurological: He is alert and oriented to person, place, and time.  Skin: Skin is warm and dry. No rash noted.  Psychiatric: He has a normal mood and affect. His behavior is normal.     ED Treatments / Results  Labs (all labs ordered are listed, but only abnormal results are displayed) Labs Reviewed  CBC - Abnormal; Notable for the following components:      Result Value   WBC 11.4 (*)    RBC 6.17 (*)    All other components within normal limits  BASIC METABOLIC PANEL  D-DIMER, QUANTITATIVE (NOT AT [facility])  I-STAT TROPONIN, ED    EKG  EKG Interpretation  Date/Time:  Saturday June 24 2017 18:47:41 EST Ventricular Rate:  128 PR Interval:  146 QRS Duration: 58 QT Interval:  286 QTC Calculation: 417 R Axis:   80 Text Interpretation:  Sinus tachycardia Anterior infarct , age undetermined Abnormal ECG Confirmed by Rolland Porter ([id number]) on 06/24/2017 7:23:26 PM       Radiology Dg Chest 2 View  Result Date: 06/24/2017 CLINICAL DATA:  Shortness of breath at the [facility] today. Pain now comes and goes. Numbness and tingling in the arms. EXAM: CHEST  2 VIEW COMPARISON:  10/27/2016 FINDINGS: Mild hyperinflation. The heart size and mediastinal contours are within normal limits. Both lungs are clear. The visualized skeletal structures are unremarkable. IMPRESSION: No active cardiopulmonary disease. Electronically Signed   By: Burman Nieves M.D.   On: 06/24/2017 20:00    Procedures Procedures (including critical care time)  Medications Ordered in ED Medications  morphine 4 MG/ML injection 4 mg (4 mg Intravenous Given 06/24/17 2007)  sodium chloride 0.9 % bolus 1,000 mL (0 mLs Intravenous  Stopped 06/24/17 2103)  ondansetron (ZOFRAN) injection 4 mg (4 mg Intravenous Given 06/24/17 2007)     Initial Impression / Assessment and Plan / ED Course  I have reviewed the triage vital signs and the nursing notes.  Pertinent labs & imaging results that were available during my care of the patient were reviewed by me and considered in my medical decision making (see chart for details).     Normal chest x-ray, EKG, troponin, d-dimer. Given pain medication. Symptoms improved. Relaxed. Discussed following up with primary care regarding treatment for his anxiety.  Final Clinical Impressions(s) / ED Diagnoses   Final diagnoses:  Panic attack  Chest pain, unspecified type    ED Discharge Orders    None       Rolland Porter, MD 06/24/17 2209

## 2017-06-29 DIAGNOSIS — F411 Generalized anxiety disorder: Secondary | ICD-10-CM | POA: Diagnosis not present

## 2017-07-01 ENCOUNTER — Ambulatory Visit: Payer: BLUE CROSS/BLUE SHIELD | Admitting: Family Medicine

## 2017-07-01 ENCOUNTER — Other Ambulatory Visit: Payer: Self-pay

## 2017-07-01 ENCOUNTER — Encounter: Payer: Self-pay | Admitting: Family Medicine

## 2017-07-01 VITALS — BP 100/86 | HR 82 | Temp 98.2°F | Resp 16 | Ht 64.0 in | Wt 150.4 lb

## 2017-07-01 DIAGNOSIS — N939 Abnormal uterine and vaginal bleeding, unspecified: Secondary | ICD-10-CM | POA: Diagnosis not present

## 2017-07-01 DIAGNOSIS — Z79899 Other long term (current) drug therapy: Secondary | ICD-10-CM

## 2017-07-01 DIAGNOSIS — E349 Endocrine disorder, unspecified: Secondary | ICD-10-CM | POA: Diagnosis not present

## 2017-07-01 DIAGNOSIS — F411 Generalized anxiety disorder: Secondary | ICD-10-CM | POA: Diagnosis not present

## 2017-07-01 DIAGNOSIS — F1320 Sedative, hypnotic or anxiolytic dependence, uncomplicated: Secondary | ICD-10-CM

## 2017-07-01 DIAGNOSIS — F902 Attention-deficit hyperactivity disorder, combined type: Secondary | ICD-10-CM

## 2017-07-01 DIAGNOSIS — N926 Irregular menstruation, unspecified: Secondary | ICD-10-CM

## 2017-07-01 MED ORDER — BUSPIRONE HCL 10 MG PO TABS: ORAL_TABLET | ORAL | 0 refills | Status: DC

## 2017-07-01 MED ORDER — CLONAZEPAM 1 MG PO TABS: 1.0000 mg | ORAL_TABLET | Freq: Every day | ORAL | 0 refills | Status: DC

## 2017-07-01 MED ORDER — BUSPIRONE HCL 15 MG PO TABS: 15.0000 mg | ORAL_TABLET | Freq: Two times a day (BID) | ORAL | 2 refills | Status: DC

## 2017-07-01 NOTE — Progress Notes (Addendum)
Subjective:  By signing my name below, I, Gary Potts, attest that this documentation has been prepared under the direction and in the presence of Gary SorensonEva Ella Guillotte, Potts. Electronically Signed: Stann Oresung-Kai Potts, Scribe. 07/01/2017 , 9:30 AM .  Patient was seen in Room 3 .   [facility], [facility]    DOB: 18-Feb-1981, 37 y.o.   MRN: [id number] Chief Complaint  Patient presents with  . Follow-up    [facility]  . Anxiety    "feeling better"   HPI Gary Potts is Potts 37 y.o. adult who [facility]. [facility] for [facility] on Jan 5th. He presented with chest tightness and pleuritic pain, was hyperventilating, and tachycardic; treated with IV morphine, Zofran, and fluids; chest xray, d-dimer, and troponin was normal. Symptoms resolved after treatment. I last saw patient 4 months ago at which he had an upcoming appointment to establish with psychiatry in [place].   Patient states his girlfriend had lost her job after last visit with me, and lost insurance, so patient was unable to see Gary Potts. Patient was also unable to establish with psychiatry, and was stretching klonopin usage. He had stopped taking klonopin for Potts week, feeling detached and "floaty"; [facility] to [name], he notes he was at [employer] hiding behind counters, and realized couldn't drive anymore. After [facility], he received klonopin tablets until seen today. He is currently seeing Gary Potts every Thursday.   Prior to panic attack, he was seen by his OBGYN/endocrinologist, Dr. Ruby ColaPittaway and notes hemoglobin was up; suggested hysterectomy. His girlfriend is 36 years old. He believes this contributed to the panic attack.   He notes Adderall XR wasn't lasting throughout the whole day.   He also mentions dental problems taken care of with 11-12 extractions and 8 fillings.   Past Medical History:  Diagnosis Date  . Anxiety   . Migraines   .  Rash   . Urticaria    Past Surgical History:  Procedure Laterality Date  . CHOLECYSTECTOMY  2014  . OOPHORECTOMY Right 2005   Prior to Admission medications   Medication Sig Start Date End Date Taking? Authorizing Provider  albuterol (PROAIR HFA) 108 (90 Base) MCG/ACT inhaler Inhale 2 puffs into the lungs every 4 (four) hours as needed for wheezing or shortness of breath. 12/30/16   Gary AnonBardelas, Gary Potts, Potts  amoxicillin (AMOXIL) 400 MG/5ML suspension Take 12.5 mLs (1,000 mg total) by mouth 2 (two) times daily. Patient not taking: Reported on 06/24/2017 04/14/17   Gary Potts, Gary Maldonado Potts, Potts  amphetamine-dextroamphetamine (ADDERALL XR) 30 MG 24 hr capsule Take 1 capsule (30 mg total) by mouth daily. Patient not taking: Reported on 06/24/2017 03/11/17   Gary Potts, Gary Parzych Potts, Potts  Ascorbic Acid (VITAMIN C) 500 MG CHEW Chew 500 mg by mouth daily.    Provider, Historical, Potts  chlorhexidine (PERIDEX) 0.12 % solution Use as directed 15 mLs in the mouth or throat 4 (four) times daily. Swish, gargle, and spit Patient not taking: Reported on 06/24/2017 03/11/17   Gary Potts, Gary Channing Potts, Potts  clindamycin (CLEOCIN T) 1 % lotion Apply topically 2 (two) times daily. Patient not taking: Reported on 06/24/2017 03/17/17   Gary AnonBardelas, Gary Potts, Potts  clonazePAM (KLONOPIN) 0.5 MG tablet Take 1 tablet (0.5 mg total) by mouth 2 (two) times daily as needed for anxiety. 02/22/17   Gary Potts, Jaysion Ramseyer Potts, Potts  EPINEPHrine (EPIPEN 2-PAK) 0.3 mg/0.3 mL  IJ SOAJ injection Use as directed for severe allergic reaction. 12/30/16   Gary Anon, Potts  fluticasone (FLONASE) 50 MCG/ACT nasal spray 2 sprays per nostril at night for stuffy nose. 12/30/16   Gary Anon, Potts  hydrOXYzine (ATARAX/VISTARIL) 50 MG tablet Take 1 tablet (50 mg total) by mouth at bedtime as needed. 02/24/17   Gary Anon, Potts  ibuprofen (ADVIL,MOTRIN) 200 MG tablet Take 400 mg by mouth every 6 (six) hours as needed for moderate pain.    Provider, Historical, Potts  levocetirizine (XYZAL) 5 MG tablet Take 1 tablet (5  mg total) by mouth every evening. Patient not taking: Reported on 06/24/2017 03/01/17   Bobbitt, Heywood Iles, Potts  montelukast (SINGULAIR) 10 MG tablet One tablet by mouth once Potts day for coughing or wheezing. Patient not taking: Reported on 06/24/2017 12/30/16   Gary Anon, Potts  predniSONE (DELTASONE) 10 MG tablet 1 TABLET TWICE Potts DAY FOR 4 DAYS AND THEN 1 TABLET ON DAY 5 Patient not taking: Reported on 06/24/2017 03/09/17   Gary Anon, Potts  ranitidine (ZANTAC) 150 MG tablet Take one tablet by mouth twice Potts day for itching. It may help heartburn. 12/30/16   Gary Anon, Potts  testosterone cypionate (DEPOTESTOSTERONE CYPIONATE) 200 MG/ML injection Inject 0.4 mLs into the muscle once Potts week. 08/31/16   Provider, Historical, Potts   Allergies  Allergen Reactions  . Penicillins Anaphylaxis and Rash    Pt can tolerate amoxicillin  Has patient had Potts PCN reaction causing immediate rash, facial/tongue/throat swelling, SOB or lightheadedness with hypotension: no  Has patient had Potts PCN reaction causing severe rash involving mucus membranes or skin necrosis: no Has patient had Potts PCN reaction that required hospitalization: unknown Has patient had Potts PCN reaction occurring within the last 10 years: no If all of the above answers are "NO", then may proceed with Cephalosporin use.   . Shellfish Allergy Anaphylaxis  . Latex Rash   Family History  Problem Relation Age of Onset  . Allergic rhinitis Mother   . Sinusitis Mother   . Sinusitis Sister   . Urticaria Sister   . Angioedema Neg Hx   . Asthma Neg Hx   . Eczema Neg Hx   . Immunodeficiency Neg Hx    Social History   Socioeconomic History  . Marital status: Married    Spouse name: None  . Number of children: None  . Years of education: None  . Highest education level: None  Social Needs  . Financial resource strain: None  . Food insecurity - worry: None  . Food insecurity - inability: None  . Transportation needs - medical: None  .  Transportation needs - non-medical: None  Occupational History  . None  Tobacco Use  . Smoking status: Never Smoker  . Smokeless tobacco: Never Used  Substance and Sexual Activity  . Alcohol use: Yes  . Drug use: No  . Sexual activity: Yes    Partners: Female    Birth control/protection: Other-see comments    Comment: NA  Other Topics Concern  . None  Social History Narrative   Given name - "Verna Hamon - changed 06/2016.   Lives with wife [name].   Works Office manager.   Depression screen [facility] 2/9 07/01/2017 03/11/2017 02/22/2017  Decreased Interest 0 0 0  Down, Depressed, Hopeless 0 0 0  PHQ - 2 Score 0 0 0    Review of Systems  Constitutional: Negative for fatigue and unexpected weight  change.  Eyes: Negative for visual disturbance.  Respiratory: Negative for cough, chest tightness and shortness of breath.   Cardiovascular: Negative for chest pain, palpitations and leg swelling.  Gastrointestinal: Negative for abdominal pain and blood in stool.  Neurological: Negative for dizziness, light-headedness and headaches.  Psychiatric/Behavioral: Negative for self-injury and suicidal ideas.       Objective:   Physical Exam  Constitutional: He is oriented to person, place, and time. He appears well-developed and well-nourished. No distress.  HENT:  Head: Normocephalic and atraumatic.  Eyes: EOM are normal. Pupils are equal, round, and reactive to light.  Neck: Neck supple.  Cardiovascular: Normal rate, regular rhythm and normal heart sounds.  Pulmonary/Chest: Effort normal and breath sounds normal. No respiratory distress.  Musculoskeletal: Normal range of motion.  Neurological: He is alert and oriented to person, place, and time.  Skin: Skin is warm and dry.  Psychiatric: He has Potts normal mood and affect. His behavior is normal.  Nursing note and vitals reviewed.   BP 100/86   Pulse 82   Temp 98.2 F (36.8 C) (Oral)   Resp 16   Ht 5\' 4"  (1.626 m)   Wt 150 lb 6.4  oz (68.2 kg)   SpO2 98%   BMI 25.82 kg/m      Assessment & Plan:   1. Generalized anxiety disorder - following closely with therapist Donnella Sham - start buspar and wean up. Then wants to gradually wean off klonopin starting next mo after buspar active. For now, continue 1mg  qd.   2. Attention deficit hyperactivity disorder (ADHD), combined type   3. Benzodiazepine dependence, continuous (HCC)   4. High risk medication use - needs routine labs to monitor hormonal/testosterone therapy which is being rx'd by Dr. Rosario Jacks but is in [place] so pt may get labs here as freq as q6 wks to q6 mos depending on sxs and med changes and we can fax over - will make sure I confer with dr. Ruby Cola on what he wants ordered but likely cbc, cmp, testosterone, estradiol.   5. Endocrine disorder   6. Abnormal bleeding in menstrual cycle - likely will get hysterectomy due to persistent menses anytime testosterone dose is lowered though might do better on Potts lower dose.     Meds ordered this encounter  Medications  . busPIRone (BUSPAR) 10 MG tablet    Sig: 1/2 tab po twice Potts day x 1 wk, then 1 tab po bid x 1 wk, 1.5 tabs po bid    Dispense:  70 tablet    Refill:  0  . busPIRone (BUSPAR) 15 MG tablet    Sig: Take 1 tablet (15 mg total) by mouth 2 (two) times daily.    Dispense:  60 tablet    Refill:  2  . clonazePAM (KLONOPIN) 1 MG tablet    Sig: Take 1 tablet (1 mg total) by mouth daily.    Dispense:  30 tablet    Refill:  0    I personally performed the services described in this documentation, which was scribed in my presence. The recorded information has been reviewed and considered, and addended by me as needed.   Gary Sorenson, M.D.  Primary Care at [facility] [address] [place], [place] [address] [phone] phone [phone] fax  07/02/17 3:30 PM

## 2017-07-01 NOTE — Patient Instructions (Signed)
     IF you received an x-ray today, you will receive an invoice from [facility]. Please contact  Radiology at [phone] with questions or concerns regarding your invoice.   IF you received labwork today, you will receive an invoice from [facility]. Please contact [facility] at [phone] with questions or concerns regarding your invoice.   Our billing staff will not be able to assist you with questions regarding bills from these companies.  You will be contacted with the lab results as soon as they are available. The fastest way to get your results is to activate your My Chart account. Instructions are located on the last page of this paperwork. If you have not heard from us regarding the results in 2 weeks, please contact this office.     

## 2017-07-02 DIAGNOSIS — E349 Endocrine disorder, unspecified: Secondary | ICD-10-CM | POA: Insufficient documentation

## 2017-07-02 DIAGNOSIS — Z79899 Other long term (current) drug therapy: Secondary | ICD-10-CM | POA: Insufficient documentation

## 2017-07-02 DIAGNOSIS — F1320 Sedative, hypnotic or anxiolytic dependence, uncomplicated: Secondary | ICD-10-CM | POA: Insufficient documentation

## 2017-07-02 DIAGNOSIS — F902 Attention-deficit hyperactivity disorder, combined type: Secondary | ICD-10-CM | POA: Insufficient documentation

## 2017-07-02 DIAGNOSIS — F411 Generalized anxiety disorder: Secondary | ICD-10-CM | POA: Insufficient documentation

## 2017-07-03 ENCOUNTER — Ambulatory Visit: Payer: Self-pay | Admitting: Family Medicine

## 2017-07-05 LAB — BASIC METABOLIC PANEL
Anion gap: 8 (ref 5–15)
BUN: 8 mg/dL (ref 6–20)
CO2: 27 mmol/L (ref 22–32)
Calcium: 9.7 mg/dL (ref 8.9–10.3)
Chloride: 102 mmol/L (ref 101–111)
Creatinine, Ser: 1.03 mg/dL — ABNORMAL HIGH (ref 0.44–1.00)
GLUCOSE: 94 mg/dL (ref 65–99)
POTASSIUM: 4.3 mmol/L (ref 3.5–5.1)
Sodium: 137 mmol/L (ref 135–145)

## 2017-07-05 LAB — CBC
HEMATOCRIT: 48.6 % — AB (ref 36.0–46.0)
Hemoglobin: 16.7 g/dL — ABNORMAL HIGH (ref 12.0–15.0)
MCH: 27.1 pg (ref 26.0–34.0)
MCHC: 34.4 g/dL (ref 30.0–36.0)
MCV: 78.8 fL (ref 78.0–100.0)
Platelets: 334 10*3/uL (ref 150–400)
RBC: 6.17 MIL/uL — ABNORMAL HIGH (ref 3.87–5.11)
RDW: 14.7 % (ref 11.5–15.5)
WBC: 11.4 10*3/uL — ABNORMAL HIGH (ref 4.0–10.5)

## 2017-07-06 DIAGNOSIS — F411 Generalized anxiety disorder: Secondary | ICD-10-CM | POA: Diagnosis not present

## 2017-07-11 ENCOUNTER — Telehealth: Payer: Self-pay

## 2017-07-11 NOTE — Telephone Encounter (Signed)
Pt. Calling wanting to go back on the xolair injections. Pt. Has new insurance. I gave [place] office number to talk with tammy

## 2017-07-13 DIAGNOSIS — F411 Generalized anxiety disorder: Secondary | ICD-10-CM | POA: Diagnosis not present

## 2017-07-20 DIAGNOSIS — F411 Generalized anxiety disorder: Secondary | ICD-10-CM | POA: Diagnosis not present

## 2017-07-27 ENCOUNTER — Encounter: Admit: 2017-07-27 | Discharge: 2017-07-28 | Payer: PRIVATE HEALTH INSURANCE

## 2017-07-27 DIAGNOSIS — N921 Excessive and frequent menstruation with irregular cycle: Secondary | ICD-10-CM

## 2017-07-27 DIAGNOSIS — N946 Dysmenorrhea, unspecified: Secondary | ICD-10-CM

## 2017-07-27 DIAGNOSIS — F649 Gender identity disorder, unspecified: Principal | ICD-10-CM

## 2017-07-27 DIAGNOSIS — Z803 Family history of malignant neoplasm of breast: Secondary | ICD-10-CM

## 2017-07-31 ENCOUNTER — Other Ambulatory Visit: Payer: Self-pay

## 2017-07-31 ENCOUNTER — Ambulatory Visit: Payer: BLUE CROSS/BLUE SHIELD | Admitting: Family Medicine

## 2017-07-31 ENCOUNTER — Encounter: Payer: Self-pay | Admitting: Family Medicine

## 2017-07-31 VITALS — BP 102/68 | HR 88 | Temp 98.1°F | Resp 18 | Ht 64.0 in | Wt 155.0 lb

## 2017-07-31 DIAGNOSIS — Z79899 Other long term (current) drug therapy: Secondary | ICD-10-CM

## 2017-07-31 DIAGNOSIS — E349 Endocrine disorder, unspecified: Secondary | ICD-10-CM

## 2017-07-31 DIAGNOSIS — N939 Abnormal uterine and vaginal bleeding, unspecified: Secondary | ICD-10-CM

## 2017-07-31 DIAGNOSIS — R718 Other abnormality of red blood cells: Secondary | ICD-10-CM

## 2017-07-31 DIAGNOSIS — F411 Generalized anxiety disorder: Secondary | ICD-10-CM

## 2017-07-31 DIAGNOSIS — D751 Secondary polycythemia: Secondary | ICD-10-CM

## 2017-07-31 MED ORDER — CLONAZEPAM 0.5 MG PO TABS: 0.5000 mg | ORAL_TABLET | Freq: Two times a day (BID) | ORAL | 1 refills | Status: DC | PRN

## 2017-07-31 MED ORDER — BUSPIRONE HCL 15 MG PO TABS: 15.0000 mg | ORAL_TABLET | Freq: Three times a day (TID) | ORAL | 1 refills | Status: DC

## 2017-07-31 NOTE — Patient Instructions (Addendum)
Check out the [facility] website for suggestions on egg freezing and ask your gynecologist.  I will also ask around. If you find a program that might work, I would be happy to write a letter explaining your situation if that would be helpful.     IF you received an x-ray today, you will receive an invoice from [facility]. Please contact [facility] at [phone] with questions or concerns regarding your invoice.   IF you received labwork today, you will receive an invoice from [facility]. Please contact [facility] at [phone] with questions or concerns regarding your invoice.   Our billing staff will not be able to assist you with questions regarding bills from these companies.  You will be contacted with the lab results as soon as they are available. The fastest way to get your results is to activate your My Chart account. Instructions are located on the last page of this paperwork. If you have not heard from [place] regarding the results in 2 weeks, please contact this office.     Iron-Rich Diet Iron is a mineral that helps your body to produce hemoglobin. Hemoglobin is a protein in your red blood cells that carries oxygen to your body's tissues. Eating too little iron may cause you to feel weak and tired, and it can increase your risk for infection. Eating enough iron is necessary for your body's metabolism, muscle function, and nervous system. Iron is naturally found in many foods. It can also be added to foods or fortified in foods. There are two types of dietary iron:  Heme iron. Heme iron is absorbed by the body more easily than nonheme iron. Heme iron is found in meat, poultry, and fish.  Nonheme iron. Nonheme iron is found in dietary supplements, iron-fortified grains, beans, and vegetables.  You may need to follow an iron-rich diet if:  You have been diagnosed with iron deficiency or iron-deficiency anemia.  You have a condition that prevents you from absorbing dietary  iron, such as: ? Infection in your intestines. ? Celiac disease. This involves long-lasting (chronic) inflammation of your intestines.  You do not eat enough iron.  You eat a diet that is high in foods that impair iron absorption.  You have lost a lot of blood.  You have heavy bleeding during your menstrual cycle.  You are pregnant.  What is my plan? Your health care provider may help you to determine how much iron you need per day based on your condition. Generally, when a person consumes sufficient amounts of iron in the diet, the following iron needs are met:  Men. ? 43-5 years old: 11 mg per day. ? 23-55 years old: 8 mg per day.  Women. ? 53-28 years old: 15 mg per day. ? 67-71 years old: 18 mg per day. ? Over 74 years old: 8 mg per day. ? Pregnant women: 27 mg per day. ? Breastfeeding women: 9 mg per day.  What do I need to know about an iron-rich diet?  Eat fresh fruits and vegetables that are high in vitamin C along with foods that are high in iron. This will help increase the amount of iron that your body absorbs from food, especially with foods containing nonheme iron. Foods that are high in vitamin C include oranges, peppers, tomatoes, and mango.  Take iron supplements only as directed by your health care provider. Overdose of iron can be life-threatening. If you were prescribed iron supplements, take them with orange juice or a vitamin C  supplement.  Cook foods in pots and pans that are made from iron.  Eat nonheme iron-containing foods alongside foods that are high in heme iron. This helps to improve your iron absorption.  Certain foods and drinks contain compounds that impair iron absorption. Avoid eating these foods in the same meal as iron-rich foods or with iron supplements. These include: ? Coffee, black tea, and red wine. ? Milk, dairy products, and foods that are high in calcium. ? Beans, soybeans, and peas. ? Whole grains.  When eating foods that  contain both nonheme iron and compounds that impair iron absorption, follow these tips to absorb iron better. ? Soak beans overnight before cooking. ? Soak whole grains overnight and drain them before using. ? Ferment flours before baking, such as using yeast in bread dough. What foods can I eat? Grains Iron-fortified breakfast cereal. Iron-fortified whole-wheat bread. Enriched rice. Sprouted grains. Vegetables Spinach. Potatoes with skin. Green peas. Broccoli. Red and green bell peppers. Fermented vegetables. Fruits Prunes. Raisins. Oranges. Strawberries. Mango. Grapefruit. Meats and Other Protein Sources Beef liver. Oysters. Beef. Shrimp. [place]. Chicken. [employer]. Sardines. Chickpeas. Nuts. Tofu. Beverages Tomato juice. Fresh orange juice. Prune juice. Hibiscus tea. Fortified instant breakfast shakes. Condiments Tahini. Fermented soy sauce. Sweets and Desserts Black-strap molasses. Other Wheat germ. The items listed above may not be a complete list of recommended foods or beverages. Contact your dietitian for more options. What foods are not recommended? Grains Whole grains. Bran cereal. Bran flour. Oats. Vegetables Artichokes. Brussels sprouts. Kale. Fruits Blueberries. Raspberries. Strawberries. Figs. Meats and Other Protein Sources Soybeans. Products made from soy protein. Dairy Milk. Cream. Cheese. Yogurt. Cottage cheese. Beverages Coffee. Black tea. Red wine. Sweets and Desserts Cocoa. Chocolate. Ice cream. Other Basil. Oregano. Parsley. The items listed above may not be a complete list of foods and beverages to avoid. Contact your dietitian for more information. This information is not intended to replace advice given to you by your health care provider. Make sure you discuss any questions you have with your health care provider. Document Released: 01/18/2005 Document Revised: 12/25/2015 Document Reviewed: 01/01/2014 Elsevier Interactive Patient Education  [employer].

## 2017-07-31 NOTE — Progress Notes (Signed)
Subjective:  By signing my name below, I, Gary Potts, attest that this documentation has been prepared under the direction and in the presence of [name], MD. Electronically Signed: Stann Oresung-Kai Potts, Scribe. 07/31/2017 , 4:39 PM .  Patient was seen in Room 3 .   [facility]    DOB: [birth year], 37 y.o.   MRN: [id number] Chief Complaint  Patient presents with  . Anxiety    GAD 7 score 10, Pt states he sometimes takes half of a klonopin.  . Follow-up   HPI Gary Potts AoSprings is a 37 y.o. adult who presents to [facility] Care at [facility] for follow up on anxiety. I last saw patient 4 weeks ago; had started Buspar with plan to gradually wean off klonopin; although, on 1mg  QD for now.   Patient states the Buspar has helped, but still has an edge. He's been doing well with Buspar 1.5 tablets twice a day, and klonopin 0.5mg  QD. He is out of both medications now. He's been feeling edgy and jittery with lowered doses of klonopin over the past few days. His wife noticed patient being more emotional and on-edge over the past 2 weeks.   His current OBGYN/endocrinologist, Dr. Ruby ColaPittaway, is retiring on March 31st. [facility] and was seen on Thursday. The GYN suggested two options: hysterectomy or come off testosterone; but opt to do surgery, so Gary Potts will have hysterectomy done on March 13th. He wants to have a biological child, and wants to freeze his eggs. He also requires a letter for changing his ID.   Past Medical History:  Diagnosis Date  . Anxiety   . Migraines   . Rash   . Urticaria    Past Surgical History:  Procedure Laterality Date  . CHOLECYSTECTOMY  2014  . OOPHORECTOMY Right 2005   Prior to Admission medications   Medication Sig Start Date End Date Taking? Authorizing Provider  albuterol (PROAIR HFA) 108 (90 Base) MCG/ACT inhaler Inhale 2 puffs into the lungs every 4 (four) hours as needed for wheezing or shortness of breath. 12/30/16   Fletcher AnonBardelas,  Jose A, MD  Ascorbic Acid (VITAMIN C) 500 MG CHEW Chew 500 mg by mouth daily.    [provider]  busPIRone (BUSPAR) 10 MG tablet 1/2 tab po twice a day x 1 wk, then 1 tab po bid x 1 wk, 1.5 tabs po bid 07/01/17   Sherren MochaShaw, Shiloh Southern N, MD  busPIRone (BUSPAR) 15 MG tablet Take 1 tablet (15 mg total) by mouth 2 (two) times daily. 07/31/17   Sherren MochaShaw, Bora Broner N, MD  clindamycin (CLEOCIN T) 1 % lotion Apply topically 2 (two) times daily. 03/17/17   Fletcher AnonBardelas, Jose A, MD  clonazePAM (KLONOPIN) 1 MG tablet Take 1 tablet (1 mg total) by mouth daily. 07/01/17   Sherren MochaShaw, Carliyah Cotterman N, MD  EPINEPHrine (EPIPEN 2-PAK) 0.3 mg/0.3 mL IJ SOAJ injection Use as directed for severe allergic reaction. 12/30/16   Fletcher AnonBardelas, Jose A, MD  hydrOXYzine (ATARAX/VISTARIL) 50 MG tablet Take 1 tablet (50 mg total) by mouth at bedtime as needed. 02/24/17   Fletcher AnonBardelas, Jose A, MD  ibuprofen (ADVIL,MOTRIN) 200 MG tablet Take 400 mg by mouth every 6 (six) hours as needed for moderate pain.    [provider]  levocetirizine (XYZAL) 5 MG tablet Take 1 tablet (5 mg total) by mouth every evening. Patient not taking: Reported on 06/24/2017 03/01/17   Bobbitt, Heywood Ilesalph Carter, MD  montelukast (SINGULAIR) 10 MG tablet One tablet  by mouth once a day for coughing or wheezing. Patient not taking: Reported on 06/24/2017 12/30/16   Fletcher Anon, MD  ranitidine (ZANTAC) 150 MG tablet Take one tablet by mouth twice a day for itching. It may help heartburn. 12/30/16   Fletcher Anon, MD  testosterone cypionate (DEPOTESTOSTERONE CYPIONATE) 200 MG/ML injection Inject 0.4 mLs into the muscle once a week. 08/31/16   [provider]   Allergies  Allergen Reactions  . Penicillins Anaphylaxis and Rash    Pt can tolerate amoxicillin  Has patient had a PCN reaction causing immediate rash, facial/tongue/throat swelling, SOB or lightheadedness with hypotension: no  Has patient had a PCN reaction causing severe rash involving mucus membranes or skin necrosis: no Has  patient had a PCN reaction that required hospitalization: unknown Has patient had a PCN reaction occurring within the last 10 years: no If all of the above answers are "NO", then may proceed with Cephalosporin use.   . Shellfish Allergy Anaphylaxis  . Latex Rash   Family History  Problem Relation Age of Onset  . Allergic rhinitis Mother   . Sinusitis Mother   . Sinusitis Sister   . Urticaria Sister   . Angioedema Neg Hx   . Asthma Neg Hx   . Eczema Neg Hx   . Immunodeficiency Neg Hx    Social History   Socioeconomic History  . Marital status: Married    Spouse name: None  . Number of children: None  . Years of education: None  . Highest education level: None  Social Needs  . Financial resource strain: None  . Food insecurity - worry: None  . Food insecurity - inability: None  . Transportation needs - medical: None  . Transportation needs - non-medical: None  Occupational History  . None  Tobacco Use  . Smoking status: Never Smoker  . Smokeless tobacco: Never Used  Substance and Sexual Activity  . Alcohol use: Yes  . Drug use: No  . Sexual activity: Yes    Partners: Female    Birth control/protection: Other-see comments    Comment: NA  Other Topics Concern  . None  Social History Narrative   Given name - "[name] - changed 06/2016.   Lives with wife Gary Potts.   Works Office manager.   Depression screen [facility] 2/9 07/31/2017 07/01/2017 03/11/2017 02/22/2017  Decreased Interest 0 0 0 0  Down, Depressed, Hopeless 0 0 0 0  PHQ - 2 Score 0 0 0 0    Review of Systems  Constitutional: Negative for chills, fatigue, fever and unexpected weight change.  Eyes: Negative for visual disturbance.  Respiratory: Negative for cough, chest tightness and shortness of breath.   Cardiovascular: Negative for chest pain, palpitations and leg swelling.  Gastrointestinal: Negative for abdominal pain, blood in stool, constipation, diarrhea, nausea and vomiting.  Skin: Negative for  rash and wound.  Neurological: Negative for dizziness, weakness, light-headedness and headaches.  Psychiatric/Behavioral: The patient is hyperactive.        Objective:   Physical Exam  Constitutional: He is oriented to person, place, and time. He appears well-developed and well-nourished. No distress.  HENT:  Head: Normocephalic and atraumatic.  Eyes: EOM are normal. Pupils are equal, round, and reactive to light.  Neck: Neck supple.  Cardiovascular: Normal rate.  Pulmonary/Chest: Effort normal. No respiratory distress.  Musculoskeletal: Normal range of motion.  Neurological: He is alert and oriented to person, place, and time.  Skin: Skin is warm and dry.  Psychiatric:  He has a normal mood and affect. His behavior is normal.  Nursing note and vitals reviewed.   BP 102/68 (BP Location: Left Arm, Patient Position: Sitting, Cuff Size: Normal)   Pulse 88   Temp 98.1 F (36.7 C) (Oral)   Resp 18   Ht 5\' 4"  (1.626 m)   Wt 155 lb (70.3 kg)   SpO2 98%   BMI 26.61 kg/m      Assessment & Plan:   1. Endocrine disorder  - FTM transgender - having problems with blood counts being to high from to high testosterone dose on 0.4 but whenever he decreases to 0.3 he gets breakthrough menstrual bleeding so is being rushed into a hysterectomy due to acuity of high RBCs. If he doesn't have uterus to bleed, then can tolerate lower testosterone doses?   Received records from pt's prior gyn Dr. Rosario Jacks who is retiring within the next 1-2 mos so I will start rxing. Check monthly labs - standing orders placed.  Need to review records and compare labs so NOT yet ready to take over rxing pt's testosterone. Woud like to get records from gyn - I thought pt said he was being seen at [facility] but no notes in [facility] Everywhere  2. High risk medication use   3. Abnormal bleeding in menstrual cycle   4. Generalized anxiety disorder - doing better since started on buspar last mo- can tolerate weaning down  to klonopin 0.5 from 1 but still with some trouble w/ klonopin wears off and sig anxiety as well as much worse ADD sxs when he has been off for the past few days - shakey, jittery, irritable, poor sleep in past few d. Increase buspar from 15 bid to 15 tid.  Klonopin dose is unchanged but will use #60 0.5mg  pills to give pt more dosing flexibility - would like to see the #60 lasting pt for > 1 mo. Pt does continue to have sig ADD sxs during the office - hx and conversation is all over the map - hard to follow.   Recheck 1 mo  Orders Placed This Encounter  Procedures  . Ferritin  . Ferritin    Meds ordered this encounter  Medications  . busPIRone (BUSPAR) 15 MG tablet    Sig: Take 1 tablet (15 mg total) by mouth 3 (three) times daily.    Dispense:  90 tablet    Refill:  1    D/c prior of buspar 10 and 15 bid  . clonazePAM (KLONOPIN) 0.5 MG tablet    Sig: Take 1 tablet (0.5 mg total) by mouth 2 (two) times daily as needed for anxiety.    Dispense:  60 tablet    Refill:  1    I personally performed the services described in this documentation, which was scribed in my presence. The recorded information has been reviewed and considered, and addended by me as needed.   Norberto Sorenson, M.D.  Primary Care at [facility] [address] [place], [place] [address] [phone] phone [phone] fax  08/03/17 12:04 AM

## 2017-08-03 ENCOUNTER — Ambulatory Visit: Payer: BLUE CROSS/BLUE SHIELD | Admitting: Family Medicine

## 2017-08-03 ENCOUNTER — Ambulatory Visit: Admit: 2017-08-03 | Discharge: 2017-08-04 | Payer: PRIVATE HEALTH INSURANCE

## 2017-08-03 DIAGNOSIS — N939 Abnormal uterine and vaginal bleeding, unspecified: Secondary | ICD-10-CM

## 2017-08-03 DIAGNOSIS — Z01818 Encounter for other preprocedural examination: Principal | ICD-10-CM

## 2017-08-03 DIAGNOSIS — N946 Dysmenorrhea, unspecified: Secondary | ICD-10-CM

## 2017-08-03 DIAGNOSIS — Z6826 Body mass index (BMI) 26.0-26.9, adult: Secondary | ICD-10-CM | POA: Diagnosis not present

## 2017-08-03 NOTE — Addendum Note (Signed)
Addended by: Baldwin CrownJOHNSON, Joneric Streight D on: 08/03/2017 08:46 AM   Modules accepted: Orders

## 2017-08-03 NOTE — Addendum Note (Signed)
Addended by: Baldwin CrownJOHNSON, SHAQUETTA D on: 08/03/2017 08:36 AM   Modules accepted: Orders

## 2017-08-03 NOTE — Addendum Note (Signed)
Addended by: Baldwin CrownJOHNSON, Layden Caterino D on: 08/03/2017 08:47 AM   Modules accepted: Orders

## 2017-08-03 NOTE — Addendum Note (Signed)
Addended by: Baldwin CrownJOHNSON, SHAQUETTA D on: 08/03/2017 08:38 AM   Modules accepted: Orders

## 2017-08-03 NOTE — Addendum Note (Signed)
Addended by: Baldwin CrownJOHNSON, Betania Dizon D on: 08/03/2017 08:49 AM   Modules accepted: Orders

## 2017-08-04 NOTE — Progress Notes (Signed)
Sent 08/04/17

## 2017-08-06 NOTE — Addendum Note (Signed)
Addended by: Sherren MochaSHAW, Errika Narvaiz N on: 08/06/2017 12:39 PM   Modules accepted: Orders

## 2017-08-10 DIAGNOSIS — F411 Generalized anxiety disorder: Secondary | ICD-10-CM | POA: Diagnosis not present

## 2017-08-17 ENCOUNTER — Telehealth: Payer: Self-pay | Admitting: Family Medicine

## 2017-08-17 DIAGNOSIS — F411 Generalized anxiety disorder: Secondary | ICD-10-CM | POA: Diagnosis not present

## 2017-08-17 NOTE — Telephone Encounter (Signed)
Pt's phone message sent to Dr. Clelia CroftShaw.  RE: labs run as male and should be male.

## 2017-08-17 NOTE — Telephone Encounter (Signed)
Copied from CRM [id number]. Topic: Inquiry >> Aug 17, 2017 10:02 AM Maia Pettiesrtiz, Kristie S wrote: Reason for CRM: Pt called in stating that he thinks we have his account setup incorrectly. Pt states he thinks we ran his recent testosterone labs as male instead of as male. Pt is wanting to know if we can update the chart and also asking if labs need to be redone because they are showing really high for male range but he thinks would be correct range for male possible. Please advise.

## 2017-08-18 ENCOUNTER — Ambulatory Visit: Payer: BLUE CROSS/BLUE SHIELD | Admitting: Family Medicine

## 2017-08-18 DIAGNOSIS — Z23 Encounter for immunization: Secondary | ICD-10-CM

## 2017-08-18 NOTE — Progress Notes (Unsigned)
Patient here for Tdap vaccine only. I was asked by Doyne Keelhanda (Clinical Mgr) to administer vaccine for her. Patient was over due for vaccine. Last vaccine was 2008, per patient.

## 2017-08-21 NOTE — Telephone Encounter (Signed)
Need to contact [facility] about this.

## 2017-08-22 ENCOUNTER — Telehealth: Payer: Self-pay | Admitting: *Deleted

## 2017-08-22 ENCOUNTER — Encounter: Admit: 2017-08-22 | Discharge: 2017-08-23 | Payer: PRIVATE HEALTH INSURANCE | Attending: Children | Primary: Children

## 2017-08-22 DIAGNOSIS — Z803 Family history of malignant neoplasm of breast: Principal | ICD-10-CM

## 2017-08-22 DIAGNOSIS — L501 Idiopathic urticaria: Secondary | ICD-10-CM | POA: Diagnosis not present

## 2017-08-22 LAB — CBC WITH DIFFERENTIAL/PLATELET
Basophils Absolute: 0 10*3/uL (ref 0.0–0.2)
Basos: 1 %
EOS (ABSOLUTE): 0.1 10*3/uL (ref 0.0–0.4)
EOS: 1 %
HEMATOCRIT: 47.3 % (ref 37.5–51.0)
HEMOGLOBIN: 16.2 g/dL (ref 13.0–17.7)
IMMATURE GRANS (ABS): 0 10*3/uL (ref 0.0–0.1)
IMMATURE GRANULOCYTES: 0 %
LYMPHS ABS: 2 10*3/uL (ref 0.7–3.1)
LYMPHS: 31 %
MCH: 26 pg — ABNORMAL LOW (ref 26.6–33.0)
MCHC: 34.2 g/dL (ref 31.5–35.7)
MCV: 76 fL — ABNORMAL LOW (ref 79–97)
MONOCYTES: 9 %
Monocytes Absolute: 0.5 10*3/uL (ref 0.1–0.9)
Neutrophils Absolute: 3.8 10*3/uL (ref 1.4–7.0)
Neutrophils: 58 %
Platelets: 349 10*3/uL (ref 150–379)
RBC: 6.23 x10E6/uL — AB (ref 4.14–5.80)
RDW: 15.9 % — ABNORMAL HIGH (ref 12.3–15.4)
WBC: 6.4 10*3/uL (ref 3.4–10.8)

## 2017-08-22 LAB — COMPREHENSIVE METABOLIC PANEL
ALK PHOS: 89 IU/L (ref 39–117)
ALT: 13 IU/L (ref 0–44)
AST: 25 IU/L (ref 0–40)
Albumin/Globulin Ratio: 1.7 (ref 1.2–2.2)
Albumin: 4.7 g/dL (ref 3.5–5.5)
BUN/Creatinine Ratio: 13 (ref 9–20)
BUN: 13 mg/dL (ref 6–20)
Bilirubin Total: 0.4 mg/dL (ref 0.0–1.2)
CO2: 21 mmol/L (ref 20–29)
CREATININE: 0.99 mg/dL (ref 0.76–1.27)
Calcium: 10 mg/dL (ref 8.7–10.2)
Chloride: 101 mmol/L (ref 96–106)
GFR calc Af Amer: 114 mL/min/{1.73_m2} (ref >59)
GFR calc non Af Amer: 98 mL/min/{1.73_m2} (ref >59)
GLUCOSE: 79 mg/dL (ref 65–99)
Globulin, Total: 2.8 g/dL (ref 1.5–4.5)
Potassium: 4.4 mmol/L (ref 3.5–5.2)
Sodium: 141 mmol/L (ref 134–144)
TOTAL PROTEIN: 7.5 g/dL (ref 6.0–8.5)

## 2017-08-22 LAB — ESTRADIOL: Estradiol: 30.2 pg/mL (ref 7.6–42.6)

## 2017-08-22 LAB — TESTT+TESTF+SHBG
Sex Hormone Binding: 47.9 nmol/L (ref 16.5–55.9)
TESTOSTERONE, TOTAL: 678.8 ng/dL (ref 264.0–916.0)
Testosterone, Free: 10.3 pg/mL (ref 8.7–25.1)

## 2017-08-22 LAB — FERRITIN: Ferritin: 24 ng/mL — ABNORMAL LOW (ref 30–400)

## 2017-08-22 NOTE — Telephone Encounter (Signed)
Have L/M for patient 3 times regarding Rx for Xolair ready at [facility] and the pharmacy has reached out.  Unfortunately patient never returned any calls.   I left one last message for patient if he was still interested in restarting Xolair. At this time I will assume he is not interested and file him to inactive

## 2017-08-23 NOTE — Progress Notes (Signed)
Lab was contacted given the correct info regarding this pt.

## 2017-08-24 DIAGNOSIS — F411 Generalized anxiety disorder: Secondary | ICD-10-CM | POA: Diagnosis not present

## 2017-08-25 ENCOUNTER — Ambulatory Visit: Payer: Self-pay

## 2017-08-28 ENCOUNTER — Ambulatory Visit: Payer: Self-pay

## 2017-08-29 ENCOUNTER — Other Ambulatory Visit: Payer: Self-pay | Admitting: Allergy

## 2017-08-29 DIAGNOSIS — N939 Abnormal uterine and vaginal bleeding, unspecified: Principal | ICD-10-CM

## 2017-08-29 MED ORDER — HYDROXYZINE HCL 50 MG PO TABS: ORAL_TABLET | ORAL | 0 refills | Status: DC

## 2017-08-30 ENCOUNTER — Encounter
Admit: 2017-08-30 | Discharge: 2017-08-30 | Payer: PRIVATE HEALTH INSURANCE | Attending: Anesthesiology | Primary: Anesthesiology

## 2017-08-30 ENCOUNTER — Encounter: Admit: 2017-08-30 | Discharge: 2017-08-30 | Payer: PRIVATE HEALTH INSURANCE

## 2017-08-30 DIAGNOSIS — N939 Abnormal uterine and vaginal bleeding, unspecified: Principal | ICD-10-CM

## 2017-08-30 DIAGNOSIS — F419 Anxiety disorder, unspecified: Secondary | ICD-10-CM | POA: Diagnosis not present

## 2017-08-30 DIAGNOSIS — F649 Gender identity disorder, unspecified: Secondary | ICD-10-CM | POA: Diagnosis not present

## 2017-08-30 DIAGNOSIS — Z90721 Acquired absence of ovaries, unilateral: Secondary | ICD-10-CM | POA: Diagnosis not present

## 2017-08-30 DIAGNOSIS — N719 Inflammatory disease of uterus, unspecified: Secondary | ICD-10-CM | POA: Diagnosis not present

## 2017-08-30 DIAGNOSIS — N83202 Unspecified ovarian cyst, left side: Secondary | ICD-10-CM | POA: Diagnosis not present

## 2017-08-30 DIAGNOSIS — N946 Dysmenorrhea, unspecified: Secondary | ICD-10-CM | POA: Diagnosis not present

## 2017-08-30 DIAGNOSIS — F640 Transsexualism: Secondary | ICD-10-CM | POA: Diagnosis not present

## 2017-08-30 HISTORY — PX: CYSTOURETHROSCOPY: SHX476

## 2017-08-30 HISTORY — PX: TOTAL LAPAROSCOPIC HYSTERECTOMY WITH SALPINGECTOMY: SHX6742

## 2017-08-30 MED ORDER — IBUPROFEN 600 MG TABLET
ORAL_TABLET | Freq: Four times a day (QID) | ORAL | 2 refills | 0 days | Status: CP | PRN
Start: 2017-08-30 — End: 2018-07-16

## 2017-08-30 MED ORDER — TRAMADOL 50 MG TABLET
ORAL_TABLET | Freq: Four times a day (QID) | ORAL | 0 refills | 0.00000 days | Status: CP | PRN
Start: 2017-08-30 — End: 2017-09-26

## 2017-08-30 MED ORDER — ACETAMINOPHEN 325 MG TABLET
ORAL_TABLET | ORAL | 2 refills | 0 days | Status: CP | PRN
Start: 2017-08-30 — End: 2018-08-30

## 2017-08-30 MED ORDER — DOCUSATE SODIUM 100 MG CAPSULE
ORAL_CAPSULE | Freq: Two times a day (BID) | ORAL | 0 refills | 0 days | Status: CP
Start: 2017-08-30 — End: 2017-09-26

## 2017-08-30 MED ORDER — OXYCODONE 5 MG TABLET
ORAL_TABLET | ORAL | 0 refills | 0 days | Status: CP | PRN
Start: 2017-08-30 — End: 2017-08-30

## 2017-08-31 ENCOUNTER — Other Ambulatory Visit: Payer: Self-pay | Admitting: Family Medicine

## 2017-08-31 NOTE — Telephone Encounter (Signed)
Last OV: 07/31/17 PCP: Clelia CroftShaw Pharmacy: [facility] [address] - [place], [place] - [phone] (Phone) [phone] (Fax)

## 2017-08-31 NOTE — Telephone Encounter (Signed)
Patient called, left VM to call the office back with who originally filled Testosterone prescription.

## 2017-08-31 NOTE — Telephone Encounter (Signed)
Copied from CRM [id number]. Topic: Inquiry >> Aug 31, 2017 10:26 AM Yvonna Alanisobinson, Andra M wrote: Reason for CRM: Patient has requested a refill of testosterone cypionate (DEPOTESTOSTERONE CYPIONATE) 200 MG/ML injection. Patient states that Dr. Clelia CroftShaw has to send thie medication to the pharmacy. Patient's preferred pharmacy is [facility] [address] - [place], [place] - [phone] (Phone)   [phone] (Fax).       Thank You!!!

## 2017-09-02 ENCOUNTER — Telehealth: Payer: Self-pay | Admitting: *Deleted

## 2017-09-02 MED ORDER — TESTOSTERONE CYPIONATE 200 MG/ML IM SOLN: 80.0000 mg | INTRAMUSCULAR | 0 refills | Status: DC

## 2017-09-02 NOTE — Telephone Encounter (Signed)
Sent in rx.

## 2017-09-02 NOTE — Telephone Encounter (Signed)
Dr. Clelia CroftShaw,   Patient states that testosterone was suppose to be called [facility] to [facility] [address].   Please Advise

## 2017-09-07 DIAGNOSIS — F411 Generalized anxiety disorder: Secondary | ICD-10-CM | POA: Diagnosis not present

## 2017-09-14 DIAGNOSIS — F411 Generalized anxiety disorder: Secondary | ICD-10-CM | POA: Diagnosis not present

## 2017-09-21 ENCOUNTER — Ambulatory Visit: Payer: Self-pay

## 2017-09-21 DIAGNOSIS — F411 Generalized anxiety disorder: Secondary | ICD-10-CM | POA: Diagnosis not present

## 2017-09-23 ENCOUNTER — Other Ambulatory Visit: Payer: Self-pay

## 2017-09-23 ENCOUNTER — Encounter: Payer: Self-pay | Admitting: Family Medicine

## 2017-09-23 ENCOUNTER — Ambulatory Visit (INDEPENDENT_AMBULATORY_CARE_PROVIDER_SITE_OTHER): Payer: BLUE CROSS/BLUE SHIELD | Admitting: Family Medicine

## 2017-09-23 VITALS — BP 100/68 | HR 80 | Temp 98.1°F | Resp 18 | Ht 64.0 in | Wt 165.4 lb

## 2017-09-23 DIAGNOSIS — Z79899 Other long term (current) drug therapy: Secondary | ICD-10-CM | POA: Diagnosis not present

## 2017-09-23 DIAGNOSIS — E349 Endocrine disorder, unspecified: Secondary | ICD-10-CM

## 2017-09-23 DIAGNOSIS — F902 Attention-deficit hyperactivity disorder, combined type: Secondary | ICD-10-CM

## 2017-09-23 DIAGNOSIS — F411 Generalized anxiety disorder: Secondary | ICD-10-CM

## 2017-09-23 LAB — POCT CBC
Granulocyte percent: 58.1 %G (ref 37–80)
HEMATOCRIT: 48.9 % (ref 43.5–53.7)
Hemoglobin: 15.7 g/dL (ref 14.1–18.1)
LYMPH, POC: 1.6 (ref 0.6–3.4)
MCH, POC: 25.1 pg — AB (ref 27–31.2)
MCHC: 32 g/dL (ref 31.8–35.4)
MCV: 78.5 fL — AB (ref 80–97)
MID (CBC): 0.3 (ref 0–0.9)
MPV: 7 fL (ref 0–99.8)
POC GRANULOCYTE: 2.7 (ref 2–6.9)
POC LYMPH %: 35.5 % (ref 10–50)
POC MID %: 6.4 % (ref 0–12)
Platelet Count, POC: 388 10*3/uL (ref 142–424)
RBC: 6.23 M/uL — AB (ref 4.69–6.13)
RDW, POC: 15.3 %
WBC: 4.6 10*3/uL (ref 4.6–10.2)

## 2017-09-23 MED ORDER — CLONAZEPAM 0.5 MG PO TABS: 0.5000 mg | ORAL_TABLET | Freq: Two times a day (BID) | ORAL | 1 refills | Status: DC | PRN

## 2017-09-23 MED ORDER — IBUPROFEN 800 MG PO TABS: 800.0000 mg | ORAL_TABLET | Freq: Three times a day (TID) | ORAL | 0 refills | Status: DC | PRN

## 2017-09-23 MED ORDER — AMPHETAMINE-DEXTROAMPHETAMINE 15 MG PO TABS: 15.0000 mg | ORAL_TABLET | Freq: Two times a day (BID) | ORAL | 0 refills | Status: DC

## 2017-09-23 MED ORDER — TESTOSTERONE CYPIONATE 200 MG/ML IM SOLN: 60.0000 mg | INTRAMUSCULAR | 1 refills | Status: DC

## 2017-09-23 NOTE — Patient Instructions (Addendum)
Increase diet significantly with soy to help with hot flashes. Recheck in 2 months. Ok to call for adderall refill next month. Decrease your testosterone to 0.51mL - this is likely contributing to the irritation/agitatioin/aggression as your body does not need as much now that there is no estrogen to combat. Decrease the buspar by 1/2 tab every 2d to get off - so cut tonight's dose to half tab so tomorrow you take 1 tab qam and 1/2 tab qhs. On Monday 4/8 decrease to 1/2 tab twice a day. On Wednesday stop your evening dose so you just are taking 1/2 tab in the morning. On Friday, you can stop the buspar all together.     IF you received an x-ray today, you will receive an invoice from [facility]. Please contact [facility] at [phone] with questions or concerns regarding your invoice.   IF you received labwork today, you will receive an invoice from [facility]. Please contact [facility] at [phone] with questions or concerns regarding your invoice.   Our billing staff will not be able to assist you with questions regarding bills from these companies.  You will be contacted with the lab results as soon as they are available. The fastest way to get your results is to activate your My Chart account. Instructions are located on the last page of this paperwork. If you have not heard from [place] regarding the results in 2 weeks, please contact this office.      Menopause and Herbal Products What is menopause? Menopause is the normal time of life when menstrual periods decrease in frequency and eventually stop completely. This process can take several years for some women. Menopause is complete when you have had an absence of menstruation for a full year since your last menstrual period. It usually occurs between the ages of 36 and 72. It is not common for menopause to begin before the age of 37. During menopause, your body stops producing the male hormones estrogen and  progesterone. Common symptoms associated with this loss of hormones (vasomotor symptoms) are:  Hot flashes.  Hot flushes.  Night sweats.  Other common symptoms and complications of menopause include:  Decrease in sex drive.  Vaginal dryness and thinning of the walls of the vagina. This can make sex painful.  Dryness of the skin and development of wrinkles.  Headaches.  Tiredness.  Irritability.  Memory problems.  Weight gain.  Bladder infections.  Hair growth on the face and chest.  Inability to reproduce offspring (infertility).  Loss of density in the bones (osteoporosis) increasing your risk for breaks (fractures).  Depression.  Hardening and narrowing of the arteries (atherosclerosis). This increases your risk of heart attack and stroke.  What treatment options are available? There are many treatment choices for menopause symptoms. The most common treatment is hormone replacement therapy. Many alternative therapies for menopause are emerging, including the use of herbal products. These supplements can be found in the form of herbs, teas, oils, tinctures, and pills. Common herbal supplements for menopause are made from plants that contain phytoestrogens. Phytoestrogens are compounds that occur naturally in plants and plant products. They act like estrogen in the body. Foods and herbs that contain phytoestrogens include:  Soy.  Flax seeds.  Red clover.  Ginseng.  What menopause symptoms may be helped if I use herbal products?  Vasomotor symptoms. These may be helped by: ? Soy. Some studies show that soy may have a moderate benefit for hot flashes. ? Black cohosh. There is  limited evidence indicating this may be beneficial for hot flashes.  Symptoms that are related to heart and blood vessel disease. These may be helped by soy. Studies have shown that soy can help to lower cholesterol.  Depression. This may be helped by: ? St. John's wort. There is limited  evidence that shows this may help mild to moderate depression. ? Black cohosh. There is evidence that this may help depression and mood swings.  Osteoporosis. Soy may help to decrease bone loss that is associated with menopause and may prevent osteoporosis. Limited evidence indicates that red clover may offer some bone loss protection as well. Other herbal products that are commonly used during menopause lack enough evidence to support their use as a replacement for conventional menopause therapies. These products include evening primrose, ginseng, and red clover. What are the cases when herbal products should not be used during menopause? Do not use herbal products during menopause without your health care provider's approval if:  You are taking medicine.  You have a preexisting liver condition.  Are there any risks in my taking herbal products during menopause? If you choose to use herbal products to help with symptoms of menopause, keep in mind that:  Different supplements have different and unmeasured amounts of herbal ingredients.  Herbal products are not regulated the same way that medicines are.  Concentrations of herbs may vary depending on the way they are prepared. For example, the concentration may be different in a pill, tea, oil, and tincture.  Little is known about the risks of using herbal products, particularly the risks of long-term use.  Some herbal supplements can be harmful when combined with certain medicines.  Most commonly reported side effects of herbal products are mild. However, if used improperly, many herbal supplements can cause serious problems. Talk to your health care provider before starting any herbal product. If problems develop, stop taking the supplement and let your health care provider know. This information is not intended to replace advice given to you by your health care provider. Make sure you discuss any questions you have with your health care  provider. Document Released: 11/23/2007 Document Revised: 05/03/2016 Document Reviewed: 11/19/2013 Elsevier Interactive Patient Education  2017 Elsevier Inc. Menopause Menopause is the normal time of life when menstrual periods stop completely. Menopause is complete when you have missed 12 consecutive menstrual periods. It usually occurs between the ages of 48 years and 55 years. Very rarely does a woman develop menopause before the age of 40 years. At menopause, your ovaries stop producing the male hormones estrogen and progesterone. This can cause undesirable symptoms and also affect your health. Sometimes the symptoms may occur 4-5 years before the menopause begins. There is no relationship between menopause and:  Oral contraceptives.  Number of children you had.  Race.  The age your menstrual periods started (menarche).  Heavy smokers and very thin women may develop menopause earlier in life. What are the causes?  The ovaries stop producing the male hormones estrogen and progesterone. Other causes include:  Surgery to remove both ovaries.  The ovaries stop functioning for no known reason.  Tumors of the pituitary gland in the brain.  Medical disease that affects the ovaries and hormone production.  Radiation treatment to the abdomen or pelvis.  Chemotherapy that affects the ovaries.  What are the signs or symptoms?  Hot flashes.  Night sweats.  Decrease in sex drive.  Vaginal dryness and thinning of the vagina causing painful intercourse.  Dryness of  the skin and developing wrinkles.  Headaches.  Tiredness.  Irritability.  Memory problems.  Weight gain.  Bladder infections.  Hair growth of the face and chest.  Infertility. More serious symptoms include:  Loss of bone (osteoporosis) causing breaks (fractures).  Depression.  Hardening and narrowing of the arteries (atherosclerosis) causing heart attacks and strokes.  How is this  diagnosed?  When the menstrual periods have stopped for 12 straight months.  Physical exam.  Hormone studies of the blood. How is this treated? There are many treatment choices and nearly as many questions about them. The decisions to treat or not to treat menopausal changes is an individual choice made with your health care provider. Your health care provider can discuss the treatments with you. Together, you can decide which treatment will work best for you. Your treatment choices may include:  Hormone therapy (estrogen and progesterone).  Non-hormonal medicines.  Treating the individual symptoms with medicine (for example antidepressants for depression).  Herbal medicines that may help specific symptoms.  Counseling by a psychiatrist or psychologist.  Group therapy.  Lifestyle changes including: ? Eating healthy. ? Regular exercise. ? Limiting caffeine and alcohol. ? Stress management and meditation.  No treatment.  Follow these instructions at home:  Take the medicine your health care provider gives you as directed.  Get plenty of sleep and rest.  Exercise regularly.  Eat a diet that contains calcium (good for the bones) and soy products (acts like estrogen hormone).  Avoid alcoholic beverages.  Do not smoke.  If you have hot flashes, dress in layers.  Take supplements, calcium, and vitamin D to strengthen bones.  You can use over-the-counter lubricants or moisturizers for vaginal dryness.  Group therapy is sometimes very helpful.  Acupuncture may be helpful in some cases. Contact a health care provider if:  You are not sure you are in menopause.  You are having menopausal symptoms and need advice and treatment.  You are still having menstrual periods after age 74 years.  You have pain with intercourse.  Menopause is complete (no menstrual period for 12 months) and you develop vaginal bleeding.  You need a referral to a specialist (gynecologist,  psychiatrist, or psychologist) for treatment. Get help right away if:  You have severe depression.  You have excessive vaginal bleeding.  You fell and think you have a broken bone.  You have pain when you urinate.  You develop leg or chest pain.  You have a fast pounding heart beat (palpitations).  You have severe headaches.  You develop vision problems.  You feel a lump in your breast.  You have abdominal pain or severe indigestion. This information is not intended to replace advice given to you by your health care provider. Make sure you discuss any questions you have with your health care provider. Document Released: 08/27/2003 Document Revised: 11/12/2015 Document Reviewed: 01/03/2013 Elsevier Interactive Patient Education  2017 [employer].

## 2017-09-23 NOTE — Progress Notes (Signed)
 Subjective:  By signing my name below, I, Tsung-Kai Tsai, attest that this documentation has been prepared under the direction and in the presence of Eva Shaw, MD. Electronically Signed: Tsung-Kai Tsai, Scribe. 09/23/2017 , 9:42 AM .  Patient was seen in Room 1 .    Patient ID: Gary Potts, adult    DOB: 02/27/1981, 36 y.o.   MRN: [id number] Chief Complaint  Patient presents with  . Anxiety    Pt states anxiety and attention span has gotten worse. GAD 7 score 15  . Follow-up   HPI Gary Potts is a 36 y.o. adult who presents to [facility] for follow up of anxiety.   Hysterectomy - [facility] Patient had oophorectomy-hysterectomy done at [facility] on March 13th, done by Michelle Lewey. He had an oophorectomy done 15 years ago at [facility] due to traces of cancer cells, so he thought he had all right sided ovary and fallopian tube removed. However, during surgery, found still had right sided fallopian tube and half ovary; due to this, there were still traces of cancer cells and inflammation caused damage to left side ovary, so no eggs were extracted. He's been having bad hot flashes recently. He also had DNA testing done and has a rare mutation BRCA2. He self injects Testosterone every Wednesday morning, and feels the best on Friday/Saturdays.   He's noticed that he's gained weight recently. He doesn't like taking the tramadol because it causes him to have increased appetite. He isn't taking iron supplements right now, but is trying to increase iron intake in his diet.   Anxiety Patient also reports he's been feeling paranoid. He's noticed Buspar has been giving him lucid dreams/nightmares, waking up in the middle of the night with night sweats. He hasn't been moody, but quiet instead and agitated. He's been taking clonazepam 0.5mg BID.   ADD He's gotten really jittery as well, and isn't even able to sit still. He notes sitting down, then standing up, then opening the  door, and then sitting down again while he was at his therapist's office a week ago. He states he's taken Adderall immediate release in the past. 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   Medications Clonazepam filled on 2/11 and 3/12 #20 tramadol filled on 3/13 Testosterone filled on 3/16  Past Medical History:  Diagnosis Date  . Anxiety   . Migraines   . Rash   . Urticaria    Past Surgical History:  Procedure Laterality Date  . ABDOMINAL HYSTERECTOMY    . CHOLECYSTECTOMY  2014  . OOPHORECTOMY Right 2005   Prior to Admission medications   Medication Sig Start Date End Date Taking? Authorizing Provider  albuterol (PROAIR HFA) 108 (90 Base) MCG/ACT inhaler Inhale 2 puffs into the lungs every 4 (four) hours as needed for wheezing or shortness of breath. 12/30/16   Bardelas, Jose A, MD  Ascorbic Acid (VITAMIN C) 500 MG CHEW Chew 500 mg by mouth daily.    [provider]  busPIRone (BUSPAR) 15 MG tablet Take 1 tablet (15 mg total) by mouth 3 (three) times daily. 07/31/17   Shaw, Eva N, MD  clindamycin (CLEOCIN T) 1 % lotion Apply topically 2 (two) times daily. 03/17/17   Bardelas, Jose A, MD  clonazePAM (KLONOPIN) 0.5 MG tablet Take 1 tablet (0.5 mg total) by mouth 2 (two) times daily as needed for anxiety. 07/31/17   Shaw, Eva N, MD  EPINEPHrine (EPIPEN 2-PAK) 0.3 mg/0.3   mL IJ SOAJ injection Use as directed for severe allergic reaction. 12/30/16   Charlies Silvers, MD  hydrOXYzine (ATARAX/VISTARIL) 50 MG tablet Take 1 tablet (50 mg total) by mouth at bedtime as needed. 02/24/17   Charlies Silvers, MD  hydrOXYzine (ATARAX/VISTARIL) 50 MG tablet Take two tablets by mouth once daily at  Night for itching 08/29/17   Charlies Silvers, MD  ibuprofen (ADVIL,MOTRIN) 200 MG tablet Take 400 mg by mouth every 6 (six) hours as needed for moderate pain.    [provider]  levocetirizine (XYZAL) 5 MG tablet Take 1 tablet (5 mg total) by mouth every evening. 03/01/17   Bobbitt,  Sedalia Muta, MD  montelukast (SINGULAIR) 10 MG tablet One tablet by mouth once a day for coughing or wheezing. 12/30/16   Charlies Silvers, MD  ranitidine (ZANTAC) 150 MG tablet Take one tablet by mouth twice a day for itching. It may help heartburn. 12/30/16   Charlies Silvers, MD  testosterone cypionate (DEPOTESTOSTERONE CYPIONATE) 200 MG/ML injection Inject 0.4 mLs (80 mg total) into the muscle once a week. 09/02/17   Shawnee Knapp, MD   Allergies  Allergen Reactions  . Penicillins Anaphylaxis and Rash    Pt can tolerate amoxicillin  Has patient had a PCN reaction causing immediate rash, facial/tongue/throat swelling, SOB or lightheadedness with hypotension: no  Has patient had a PCN reaction causing severe rash involving mucus membranes or skin necrosis: no Has patient had a PCN reaction that required hospitalization: unknown Has patient had a PCN reaction occurring within the last 10 years: no If all of the above answers are "NO", then may proceed with Cephalosporin use.   . Shellfish Allergy Anaphylaxis  . Latex Rash   Family History  Problem Relation Age of Onset  . Allergic rhinitis Mother   . Sinusitis Mother   . Sinusitis Sister   . Urticaria Sister   . Angioedema Neg Hx   . Asthma Neg Hx   . Eczema Neg Hx   . Immunodeficiency Neg Hx    Social History   Socioeconomic History  . Marital status: Married    Spouse name: Not on file  . Number of children: Not on file  . Years of education: Not on file  . Highest education level: Not on file  Occupational History  . Not on file  Social Needs  . Financial resource strain: Not on file  . Food insecurity:    Worry: Not on file    Inability: Not on file  . Transportation needs:    Medical: Not on file    Non-medical: Not on file  Tobacco Use  . Smoking status: Never Smoker  . Smokeless tobacco: Never Used  Substance and Sexual Activity  . Alcohol use: Yes  . Drug use: No  . Sexual activity: Yes    Partners: Female      Birth control/protection: Other-see comments    Comment: NA  Lifestyle  . Physical activity:    Days per week: Not on file    Minutes per session: Not on file  . Stress: Not on file  Relationships  . Social connections:    Talks on phone: Not on file    Gets together: Not on file    Attends religious service: Not on file    Active member of club or organization: Not on file    Attends meetings of clubs or organizations: Not on file    Relationship status: Not on file  Other Topics Concern  . Not on file  Social History Narrative   Given name - "Anderson [place] - changed 06/2016.   Lives with wife [name].   Works [employer].   Depression screen [facility] 2/9 09/23/2017 07/31/2017 07/01/2017 03/11/2017 02/22/2017  Decreased Interest 0 0 0 0 0  Down, Depressed, Hopeless 0 0 0 0 0  PHQ - 2 Score 0 0 0 0 0    Review of Systems  Constitutional: Negative for chills, fatigue, fever and unexpected weight change.  Respiratory: Negative for cough.   Gastrointestinal: Negative for constipation, diarrhea, nausea and vomiting.  Skin: Negative for rash and wound.  Neurological: Negative for dizziness, weakness and headaches.  Psychiatric/Behavioral: Positive for agitation, decreased concentration and sleep disturbance. The patient is nervous/anxious.        Objective:   Physical Exam  Constitutional: He is oriented to person, place, and time. He appears well-developed and well-nourished. No distress.  HENT:  Head: Normocephalic and atraumatic.  Eyes: Pupils are equal, round, and reactive to light. EOM are normal.  Neck: Neck supple.  Cardiovascular: Normal rate.  Pulmonary/Chest: Effort normal. No respiratory distress.  Musculoskeletal: Normal range of motion.  Neurological: He is alert and oriented to person, place, and time.  Skin: Skin is warm and dry.  Psychiatric: He has a normal mood and affect. His behavior is normal.  Nursing note and vitals reviewed.   BP 100/68 (BP  Location: Left Arm, Patient Position: Sitting, Cuff Size: Normal)   Pulse 80   Temp 98.1 F (36.7 C) (Oral)   Resp 18   Ht 5' 4" (1.626 m)   Wt 165 lb 6.4 oz (75 kg)   SpO2 96%   BMI 28.39 kg/m      Assessment & Plan:   1. Generalized anxiety disorder - did not do respond well to buspar 15 tid- more moody, withdrawn, nightmares/vivid dreams - wean off buspar (instructions in AVS).  Feels like sxs are relatively well controlled just on the klonopin 0.5 bid prn which he has used for many years and does go off of for periods but ends up needing ot restart - last filled klonopin 2/11 and 3/12.  HOpefully anxiety and mood sxs will improve now that we have a chance of getting hormone regimen stabilized and being able to come down on the testosterone.  (On hydroxyzine for allergies/itching - does not affect anxiety per pt)  2. 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 [place] to call for 1 mo refill of adderall IR but since this is a new regimen (for ME to rx him), recheck in [place] in 2 mos to assess.  3. High risk medication use   4. 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 Decrease testosterone from 85m(=0.4mL) IM qwk to 666m(=0.24m19mIM qwk - recheck in 3 mos - sooner if worse.    Refilled ibuprofen for post-op hysterectomy pain Labs today but hysterectomy/oopherectomy was 3/13 - just 3 week prior so gonadotropins and ultimate effect on endogenous hormone levels and metabolism still in  flux from surgery - will check labs today since having so many hot flashes  (try increasing soy in diet)  but also repeat labs sev d to a weeks before next visit in 2 mos which will provide a more accurate assessment of current T regimen in light of oopherectomy.  Orders Placed This Encounter  Procedures  . Iron, TIBC and Ferritin Panel  . Testosterone  . ToxASSURE Select 13 (MW), Urine  . TSH  . Lipid panel    Order Specific Question:   Has the patient fasted?    Answer:   Yes  . Estradiol  . FSH/LH  . Iron, TIBC and Ferritin Panel  . FSH/LH  . Testosterone  . Estradiol  . POCT CBC    Meds ordered this encounter  Medications  . amphetamine-dextroamphetamine (ADDERALL) 15 MG tablet    Sig: Take 1 tablet by mouth 2 (two) times daily. With second dose before 2 pm    Dispense:  60 tablet    Refill:  0  . testosterone cypionate (DEPOTESTOSTERONE CYPIONATE) 200 MG/ML injection    Sig: Inject 0.3 mLs (60 mg total) into the muscle once a week.    Dispense:  2 mL    Refill:  1  . clonazePAM (KLONOPIN) 0.5 MG tablet    Sig: Take 1 tablet (0.5 mg total) by mouth 2 (two) times daily as needed for anxiety.    Dispense:  60 tablet    Refill:  1  . ibuprofen (ADVIL,MOTRIN) 800 MG tablet    Sig: Take 1 tablet (800 mg total) by mouth every 8 (eight) hours as needed.    Dispense:  30 tablet    Refill:  0    I personally performed the services described in this documentation, which was scribed in my presence. The recorded information has been reviewed and considered, and addended by me as needed.   Eva Shaw, M.D.  [facility]  [facility] [address] [place] [address] [place], [place] [address] [phone] phone [phone] fax  11/21/17 3:31 PM  

## 2017-09-24 LAB — LIPID PANEL
CHOL/HDL RATIO: 3.5 ratio (ref 0.0–5.0)
Cholesterol, Total: 191 mg/dL (ref 100–199)
HDL: 55 mg/dL (ref >39)
LDL Calculated: 124 mg/dL — ABNORMAL HIGH (ref 0–99)
TRIGLYCERIDES: 62 mg/dL (ref 0–149)
VLDL CHOLESTEROL CAL: 12 mg/dL (ref 5–40)

## 2017-09-24 LAB — IRON,TIBC AND FERRITIN PANEL
Ferritin: 35 ng/mL (ref 30–400)
Iron Saturation: 28 % (ref 15–55)
Iron: 108 ug/dL (ref 38–169)
TIBC: 381 ug/dL (ref 250–450)
UIBC: 273 ug/dL (ref 111–343)

## 2017-09-24 LAB — ESTRADIOL: Estradiol: 23 pg/mL (ref 7.6–42.6)

## 2017-09-24 LAB — TESTOSTERONE: TESTOSTERONE: 564 ng/dL (ref 264–916)

## 2017-09-24 LAB — TSH: TSH: 0.336 u[IU]/mL — ABNORMAL LOW (ref 0.450–4.500)

## 2017-09-24 LAB — FSH/LH
FSH: 15.6 m[IU]/mL — AB (ref 1.5–12.4)
LH: 6.7 m[IU]/mL (ref 1.7–8.6)

## 2017-09-26 ENCOUNTER — Encounter: Admit: 2017-09-26 | Discharge: 2017-09-27 | Payer: PRIVATE HEALTH INSURANCE

## 2017-09-26 DIAGNOSIS — F649 Gender identity disorder, unspecified: Secondary | ICD-10-CM

## 2017-09-26 DIAGNOSIS — R350 Frequency of micturition: Secondary | ICD-10-CM

## 2017-09-26 DIAGNOSIS — M7918 Myalgia, other site: Principal | ICD-10-CM

## 2017-09-26 DIAGNOSIS — Z09 Encounter for follow-up examination after completed treatment for conditions other than malignant neoplasm: Secondary | ICD-10-CM

## 2017-09-26 MED ORDER — CYCLOBENZAPRINE 5 MG TABLET
ORAL_TABLET | 3 refills | 0 days | Status: CP
Start: 2017-09-26 — End: 2018-07-16

## 2017-09-28 DIAGNOSIS — F411 Generalized anxiety disorder: Secondary | ICD-10-CM | POA: Diagnosis not present

## 2017-09-28 LAB — TOXASSURE SELECT 13 (MW), URINE

## 2017-10-02 ENCOUNTER — Ambulatory Visit: Payer: BLUE CROSS/BLUE SHIELD | Admitting: Family Medicine

## 2017-10-13 DIAGNOSIS — F411 Generalized anxiety disorder: Secondary | ICD-10-CM | POA: Diagnosis not present

## 2017-10-17 DIAGNOSIS — F411 Generalized anxiety disorder: Secondary | ICD-10-CM | POA: Diagnosis not present

## 2017-10-19 ENCOUNTER — Other Ambulatory Visit: Payer: Self-pay

## 2017-10-19 ENCOUNTER — Other Ambulatory Visit: Payer: Self-pay | Admitting: Family Medicine

## 2017-10-19 MED ORDER — AMPHETAMINE-DEXTROAMPHETAMINE 15 MG PO TABS: 15.0000 mg | ORAL_TABLET | Freq: Two times a day (BID) | ORAL | 0 refills | Status: DC

## 2017-10-19 NOTE — Telephone Encounter (Signed)
Copied from CRM [id number]. Topic: Quick Communication - Rx Refill/Question >> Oct 19, 2017  9:29 AM Leafy Ro wrote: Medication: generic adderall 15 mg Has the patient contacted their pharmacy? yes (Agent: If no, request that the patient contact the pharmacy for the refill.) Preferred Pharmacy (with phone number or street name):[facility] [address]

## 2017-10-19 NOTE — Addendum Note (Signed)
Addended by: Sherren Mocha on: 10/19/2017 06:30 PM   Modules accepted: Orders

## 2017-10-19 NOTE — Telephone Encounter (Signed)
Last adderall rx was rx'd and filled on 4/6 so next will be due 5/5 - will send in rx that is postdated to be filled on that day or after. Will need OV for any additional refills.

## 2017-10-19 NOTE — Telephone Encounter (Signed)
Please advise 

## 2017-10-26 DIAGNOSIS — F411 Generalized anxiety disorder: Secondary | ICD-10-CM | POA: Diagnosis not present

## 2017-10-31 DIAGNOSIS — F411 Generalized anxiety disorder: Secondary | ICD-10-CM | POA: Diagnosis not present

## 2017-11-01 ENCOUNTER — Other Ambulatory Visit: Payer: Self-pay | Admitting: Pediatrics

## 2017-11-01 NOTE — Telephone Encounter (Signed)
Denied refill atarax.  Per chart, Return in about 4 weeks (around 02/20/2017).

## 2017-11-03 ENCOUNTER — Encounter: Payer: BLUE CROSS/BLUE SHIELD | Admitting: Family Medicine

## 2017-11-03 ENCOUNTER — Telehealth: Payer: Self-pay | Admitting: Family Medicine

## 2017-11-03 DIAGNOSIS — Z79899 Other long term (current) drug therapy: Secondary | ICD-10-CM

## 2017-11-03 DIAGNOSIS — Z7952 Long term (current) use of systemic steroids: Secondary | ICD-10-CM

## 2017-11-03 DIAGNOSIS — R7989 Other specified abnormal findings of blood chemistry: Secondary | ICD-10-CM

## 2017-11-03 NOTE — Telephone Encounter (Signed)
Pt states that on Monday 11/06/2017 pt is going to see Dr. Harlene Salts for top surgery (Mastectomy) consultation and he might need a referral. 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 They might schedule pt for surgery soon. Pt said Dr. Clelia Croft can message him on MyChart. The telephone number on file is correct.

## 2017-11-03 NOTE — Progress Notes (Deleted)
   Subjective:  By signing my name below, I, Essence Howell, attest that this documentation has been prepared under the direction and in the presence of Norberto Sorenson, MD Electronically Signed: Charline Bills, ED Scribe 11/03/2017 at 11:47 AM.   Patient ID: Gary Potts, adult    DOB: March 06, 1981, 37 y.o.   MRN: [id number]  No chief complaint on file.  HPI Gary Potts is a 37 y.o. adult who presents to Primary Care at [facility] complaining of    Past Medical History:  Diagnosis Date  . Anxiety   . Migraines   . Rash   . Urticaria    Current Outpatient Medications on File Prior to Visit  Medication Sig Dispense Refill  . albuterol (PROAIR HFA) 108 (90 Base) MCG/ACT inhaler Inhale 2 puffs into the lungs every 4 (four) hours as needed for wheezing or shortness of breath. 1 Inhaler 1  . amphetamine-dextroamphetamine (ADDERALL) 15 MG tablet Take 1 tablet by mouth 2 (two) times daily. With second dose before 2 pm 60 tablet 0  . Ascorbic Acid (VITAMIN C) 500 MG CHEW Chew 500 mg by mouth daily.    . clindamycin (CLEOCIN T) 1 % lotion Apply topically 2 (two) times daily. 60 mL 2  . clonazePAM (KLONOPIN) 0.5 MG tablet Take 1 tablet (0.5 mg total) by mouth 2 (two) times daily as needed for anxiety. 60 tablet 1  . EPINEPHrine (EPIPEN 2-PAK) 0.3 mg/0.3 mL IJ SOAJ injection Use as directed for severe allergic reaction. 2 Device 1  . hydrOXYzine (ATARAX/VISTARIL) 50 MG tablet Take 1 tablet (50 mg total) by mouth at bedtime as needed. 30 tablet 3  . hydrOXYzine (ATARAX/VISTARIL) 50 MG tablet Take two tablets by mouth once daily at  Night for itching 60 tablet 0  . ibuprofen (ADVIL,MOTRIN) 800 MG tablet Take 1 tablet (800 mg total) by mouth every 8 (eight) hours as needed. 30 tablet 0  . levocetirizine (XYZAL) 5 MG tablet Take 1 tablet (5 mg total) by mouth every evening. 30 tablet 5  . montelukast (SINGULAIR) 10 MG tablet One tablet by mouth once a day for coughing or wheezing. 34 tablet 5  . ranitidine  (ZANTAC) 150 MG tablet Take one tablet by mouth twice a day for itching. It may help heartburn. 60 tablet 5  . testosterone cypionate (DEPOTESTOSTERONE CYPIONATE) 200 MG/ML injection Inject 0.3 mLs (60 mg total) into the muscle once a week. 2 mL 1   Current Facility-Administered Medications on File Prior to Visit  Medication Dose Route Frequency Provider Last Rate Last Dose  . omalizumab [name]) injection 300 mg  300 mg Subcutaneous Q28 days Stephannie Li A, MD   300 mg at 05/01/17 1028   Allergies  Allergen Reactions  . Penicillins Anaphylaxis and Rash    Pt can tolerate amoxicillin  Has patient had a PCN reaction causing immediate rash, facial/tongue/throat swelling, SOB or lightheadedness with hypotension: no  Has patient had a PCN reaction causing severe rash involving mucus membranes or skin necrosis: no Has patient had a PCN reaction that required hospitalization: unknown Has patient had a PCN reaction occurring within the last 10 years: no If all of the above answers are "NO", then may proceed with Cephalosporin use.   . Shellfish Allergy Anaphylaxis  . Latex Rash   Review of Systems    Objective:   Physical Exam There were no vitals taken for this visit.    Assessment & Plan:

## 2017-11-04 NOTE — Telephone Encounter (Signed)
Phone message sent to Dr. Clelia Croft - [place].

## 2017-11-06 DIAGNOSIS — N62 Hypertrophy of breast: Secondary | ICD-10-CM | POA: Diagnosis not present

## 2017-11-07 ENCOUNTER — Ambulatory Visit: Payer: BLUE CROSS/BLUE SHIELD | Admitting: Pediatrics

## 2017-11-07 DIAGNOSIS — F411 Generalized anxiety disorder: Secondary | ICD-10-CM | POA: Diagnosis not present

## 2017-11-08 NOTE — Telephone Encounter (Signed)
Well 5/20 has come and gone before I saw message, so hopefully didn't need referral for that initial appointment but of course happy to place referral or provide letter if pt does need in future.

## 2017-11-14 ENCOUNTER — Telehealth: Payer: Self-pay | Admitting: Family Medicine

## 2017-11-14 DIAGNOSIS — F411 Generalized anxiety disorder: Secondary | ICD-10-CM | POA: Diagnosis not present

## 2017-11-14 NOTE — Telephone Encounter (Signed)
Called to let pt know that medical records were ready for pick up. They are ready at [address].

## 2017-11-16 NOTE — Telephone Encounter (Addendum)
No problem.  Will be sure to do this weekend and let her know by MyChart when it is ready. Please let pt know that she can likely plan to pick up signed copy on Monday 6/3.

## 2017-11-16 NOTE — Telephone Encounter (Signed)
Pt came by and said does need referral letter for the top surgery for Dr. Harlene Salts.  Please call asap to pick up.

## 2017-11-17 NOTE — Progress Notes (Signed)
This encounter was created in error - please disregard.

## 2017-11-20 ENCOUNTER — Encounter: Payer: Self-pay | Admitting: Pediatrics

## 2017-11-20 ENCOUNTER — Ambulatory Visit (INDEPENDENT_AMBULATORY_CARE_PROVIDER_SITE_OTHER): Payer: BLUE CROSS/BLUE SHIELD | Admitting: Pediatrics

## 2017-11-20 VITALS — BP 108/64 | HR 86 | Temp 98.8°F | Resp 20 | Ht 63.8 in | Wt 166.2 lb

## 2017-11-20 DIAGNOSIS — J3089 Other allergic rhinitis: Secondary | ICD-10-CM

## 2017-11-20 DIAGNOSIS — L508 Other urticaria: Secondary | ICD-10-CM

## 2017-11-20 DIAGNOSIS — J4530 Mild persistent asthma, uncomplicated: Secondary | ICD-10-CM

## 2017-11-20 DIAGNOSIS — L503 Dermatographic urticaria: Secondary | ICD-10-CM | POA: Diagnosis not present

## 2017-11-20 DIAGNOSIS — T7800XD Anaphylactic reaction due to unspecified food, subsequent encounter: Secondary | ICD-10-CM | POA: Diagnosis not present

## 2017-11-20 MED ORDER — ALBUTEROL SULFATE HFA 108 (90 BASE) MCG/ACT IN AERS: 2.0000 | INHALATION_SPRAY | RESPIRATORY_TRACT | 2 refills | Status: DC | PRN

## 2017-11-20 MED ORDER — MONTELUKAST SODIUM 10 MG PO TABS: ORAL_TABLET | ORAL | 5 refills | Status: DC

## 2017-11-20 MED ORDER — HYDROXYZINE HCL 25 MG PO TABS: 25.0000 mg | ORAL_TABLET | Freq: Every day | ORAL | 5 refills | Status: DC

## 2017-11-20 MED ORDER — EPINEPHRINE 0.3 MG/0.3ML IJ SOAJ: INTRAMUSCULAR | 1 refills | Status: AC

## 2017-11-20 MED ORDER — FLUTICASONE PROPIONATE 50 MCG/ACT NA SUSP: 2.0000 | Freq: Every day | NASAL | 5 refills | Status: DC | PRN

## 2017-11-20 NOTE — Progress Notes (Signed)
[address] [place] [address] Dept: [phone]  FOLLOW UP NOTE  Patient ID: Old [place] Potts, adult    DOB: 1980/12/01  Age: 37 y.o. MRN: [id number] Date of Office Visit: 11/20/2017  Assessment  Chief Complaint: Rash (has been doing well.  pollen makes rash worse.); Conjunctivitis (eyes itchy watery burning.); and Medication Management (discuss xolair tx)  [name] for follow-up of asthma, allergic rhinitis and chronic urticaria. His last Xolair injection was in October 2018 and the itching has returned and is much worse. He wants to restart Xolair injections. He has had a very severe spring allergy season.Marland Kitchen. He is on hydroxyzine  50 mg at night, Zyrtec 10 mg in the morning, montelukast 10 mg once a day .day He had a hysterectomy on March 13 of this year.   Drug Allergies:  Allergies  Allergen Reactions  . Penicillins Anaphylaxis and Rash    Pt can tolerate amoxicillin  Has patient had a PCN reaction causing immediate rash, facial/tongue/throat swelling, SOB or lightheadedness with hypotension: no  Has patient had a PCN reaction causing severe rash involving mucus membranes or skin necrosis: no Has patient had a PCN reaction that required hospitalization: unknown Has patient had a PCN reaction occurring within the last 10 years: no If all of the above answers are "NO", then may proceed with Cephalosporin use.   . Shellfish Allergy Anaphylaxis  . Latex Rash    Physical Exam: BP 108/64 (BP Location: Left Arm, Patient Position: Sitting, Cuff Size: Normal)   Pulse 86   Temp 98.8 F (37.1 C) (Oral)   Resp 20   Ht 5' 3.8" (1.621 m)   Wt 166 lb 3.2 oz (75.4 kg)   SpO2 97%   BMI 28.71 kg/m    Physical Exam  Constitutional: He is oriented to person, place, and time. He appears well-developed and well-nourished.  HENT:  Eyes showed mild erythema of the palpebral conjunctiva. Ears normal. Nose mild swelling of nasal turbinates. Pharynx normal.  Neck: Neck  supple.  Cardiovascular:  S1 and S2 normal no murmurs  Pulmonary/Chest:  Clear to percussion and auscultation  Lymphadenopathy:    He has no cervical adenopathy.  Neurological: He is alert and oriented to person, place, and time.  Skin:  A few hives and dermographia  Psychiatric: He has a normal mood and affect. His behavior is normal. Judgment and thought content normal.  Vitals reviewed.   Diagnostics:  FVC 3.22 L FEV1 2.81 L. Predicted FVC 3.17 L predicted FEV1 2.66 L-the spirometry is in the normal range  Assessment and Plan: 1. Chronic urticaria   2. Mild persistent asthma without complication   3. Anaphylactic shock due to food, subsequent encounter   4. Other allergic rhinitis   5. Dermographia     Meds ordered this encounter  Medications  . fluticasone (FLONASE) 50 MCG/ACT nasal spray    Sig: Place 2 sprays into both nostrils daily as needed (for stuffy nose).    Dispense:  16 g    Refill:  5  . montelukast (SINGULAIR) 10 MG tablet    Sig: One tablet by mouth once a day for coughing or wheezing.    Dispense:  34 tablet    Refill:  5  . albuterol (PROAIR HFA) 108 (90 Base) MCG/ACT inhaler    Sig: Inhale 2 puffs into the lungs every 4 (four) hours as needed for wheezing or shortness of breath.    Dispense:  1 Inhaler    Refill:  2  . EPINEPHrine (EPIPEN 2-PAK) 0.3 mg/0.3 mL IJ SOAJ injection    Sig: Use as directed for severe allergic reaction.    Dispense:  2 Device    Refill:  1    Dispense mylan brand or generic whichever is cheaper for the pt.  . hydrOXYzine (ATARAX/VISTARIL) 25 MG tablet    Sig: Take 1 tablet (25 mg total) by mouth at bedtime.    Dispense:  30 tablet    Refill:  5    Patient Instructions   Zyrtec 10 mg in the morning for runny nose or itching Hydroxyzine 25 mg-take 1 tablet at night for itching Fluticasone 2 sprays per nostril once a day if needed for stuffy nose Montelukast 10 mg-take 1 tablet once a day to prevent coughing or  wheezing Pro-air 2 puffs every 4 hours if needed for wheezing or coughing spells Add prednisone 20 mg twice a day for 3 days, 20 mg on the fourth day, 10 mg on the fifth day to bring your allergic symptoms under control Resume Xolair this week to control your hives Call us if you're not doing well on this treatment plan  Avoid [place] fish , and shellfish and the beef noodle soup. If you have an allergic reaction take Benadryl 50 mg every 4 hours and if you have life-threatening symptoms inject  with EpiPen 0.3 mg   Return in about 6 months (around 05/22/2018).    Thank you for the opportunity to care for this patient.  Please do not hesitate to contact me with questions.  Tonette Bihari, M.D.  [facility] [address] [place], [place] [address] [phone]

## 2017-11-20 NOTE — Patient Instructions (Addendum)
Zyrtec 10 mg in the morning for runny nose or itching Hydroxyzine 25 mg-take 1 tablet at night for itching Fluticasone 2 sprays per nostril once a day if needed for stuffy nose Montelukast 10 mg-take 1 tablet once a day to prevent coughing or wheezing Pro-air 2 puffs every 4 hours if needed for wheezing or coughing spells Add prednisone 20 mg twice a day for 3 days, 20 mg on the fourth day, 10 mg on the fifth day to bring your allergic symptoms under control Resume Xolair this week to control your hives Call us if you're not doing well on this treatment plan  Avoid [place]an fish , and shellfish and the beef noodle soup. If you have an allergic reaction take Benadryl 50 mg every 4 hours and if you have life-threatening symptoms inject  with EpiPen 0.3 mg

## 2017-11-21 ENCOUNTER — Encounter: Payer: Self-pay | Admitting: Family Medicine

## 2017-11-21 DIAGNOSIS — F411 Generalized anxiety disorder: Secondary | ICD-10-CM | POA: Diagnosis not present

## 2017-11-21 NOTE — Telephone Encounter (Signed)
Letter is ready for pick up - please call to see if he could come in to pick up letter tomorrow Wed 6/5 AND have labs drawn in repeat lab only visit so we have at least some of the results back to review together during his OV Thurs 6/6

## 2017-11-22 ENCOUNTER — Ambulatory Visit: Payer: BLUE CROSS/BLUE SHIELD

## 2017-11-22 DIAGNOSIS — Z7952 Long term (current) use of systemic steroids: Secondary | ICD-10-CM

## 2017-11-22 DIAGNOSIS — R7989 Other specified abnormal findings of blood chemistry: Secondary | ICD-10-CM | POA: Diagnosis not present

## 2017-11-22 DIAGNOSIS — Z79899 Other long term (current) drug therapy: Secondary | ICD-10-CM

## 2017-11-22 NOTE — Telephone Encounter (Signed)
mychart message sent to pt about letter

## 2017-11-22 NOTE — Progress Notes (Unsigned)
Pt came for a lab only visit. Pt was not seen by provider.    Unable to close encounter due to provider being off and unable to place on her schedule

## 2017-11-22 NOTE — Addendum Note (Signed)
Addended by: Berna BueWHITAKER, Marsena Taff L on: 11/22/2017 12:02 PM   Modules accepted: Orders

## 2017-11-23 ENCOUNTER — Ambulatory Visit (INDEPENDENT_AMBULATORY_CARE_PROVIDER_SITE_OTHER): Payer: BLUE CROSS/BLUE SHIELD | Admitting: Family Medicine

## 2017-11-23 ENCOUNTER — Other Ambulatory Visit: Payer: Self-pay

## 2017-11-23 ENCOUNTER — Ambulatory Visit (INDEPENDENT_AMBULATORY_CARE_PROVIDER_SITE_OTHER): Payer: BLUE CROSS/BLUE SHIELD

## 2017-11-23 ENCOUNTER — Encounter: Payer: Self-pay | Admitting: Family Medicine

## 2017-11-23 VITALS — BP 116/82 | HR 90 | Temp 98.0°F | Resp 18 | Ht 63.58 in | Wt 166.8 lb

## 2017-11-23 DIAGNOSIS — Z79899 Other long term (current) drug therapy: Secondary | ICD-10-CM

## 2017-11-23 DIAGNOSIS — K047 Periapical abscess without sinus: Secondary | ICD-10-CM

## 2017-11-23 DIAGNOSIS — L508 Other urticaria: Secondary | ICD-10-CM

## 2017-11-23 DIAGNOSIS — F411 Generalized anxiety disorder: Secondary | ICD-10-CM | POA: Diagnosis not present

## 2017-11-23 DIAGNOSIS — K029 Dental caries, unspecified: Secondary | ICD-10-CM

## 2017-11-23 DIAGNOSIS — F902 Attention-deficit hyperactivity disorder, combined type: Secondary | ICD-10-CM

## 2017-11-23 DIAGNOSIS — R7989 Other specified abnormal findings of blood chemistry: Secondary | ICD-10-CM

## 2017-11-23 DIAGNOSIS — R718 Other abnormality of red blood cells: Secondary | ICD-10-CM

## 2017-11-23 DIAGNOSIS — L501 Idiopathic urticaria: Secondary | ICD-10-CM

## 2017-11-23 DIAGNOSIS — Z7952 Long term (current) use of systemic steroids: Secondary | ICD-10-CM

## 2017-11-23 MED ORDER — CLONAZEPAM 0.5 MG PO TABS: 0.5000 mg | ORAL_TABLET | Freq: Two times a day (BID) | ORAL | 1 refills | Status: DC | PRN

## 2017-11-23 MED ORDER — AMPHETAMINE-DEXTROAMPHETAMINE 20 MG PO TABS: 20.0000 mg | ORAL_TABLET | Freq: Two times a day (BID) | ORAL | 0 refills | Status: DC

## 2017-11-23 MED ORDER — CHLORHEXIDINE GLUCONATE 0.12 % MT SOLN: 15.0000 mL | Freq: Two times a day (BID) | OROMUCOSAL | 1 refills | Status: DC

## 2017-11-23 NOTE — Progress Notes (Signed)
Subjective:  By signing my name below, I, Stann Ore, attest that this documentation has been prepared under the direction and in the presence of Norberto Sorenson, MD. Electronically Signed: Stann Ore, Scribe. 11/23/2017 , 9:16 AM .  Patient was seen in Room 2 .   Patient ID: [facility], adult    DOB: 1980-07-03, 37 y.o.   MRN: [id number] Chief Complaint  Patient presents with  . Anxiety    2 month follow-up, pt also states she has been having some right eye twitching x 3 weeks    HPI Gary Potts is a 37 y.o. adult who presents to Primary Care at [facility] for follow up of anxiety.   Gender-affirming hormone therapy - MTF He had a hysterectomy and bilateral oophorectomy on March 13th, so is now able to wean down on his testosterone to 0.90mL.  He informs being more irritable on testosterone 0.3, but felt better on 0.4; usually injections on Tuesday mornings.   ADD His adderall last filled on 4/6 and 5/6.  He's tried ritalin in the past when he was younger, but would "zombify" him though doing well in school. Adderall was more tolerable.  He felt more anxious during the LSATs, where he kept getting up; notes he didn't take clonazepam or adderall for taking the test. He was able to focus on the practice exam while on Adderall. He'll receive his LSATs score in 3 weeks.  He informs drinking 3 cups of coffee in the morning, waking up at 4:00 AM to get his body going, and drinking more coffee at work too. He hasn't taken his adderall today.    Mood d/o - anxeity [facility] reviewed and clonazepam was filled on 3/12, 4/13, and 5/13. He's been seen for EDMR by his therapist, Edson Snowball, with improvement of his irritability. He's been taking clonazepam at night, and morning prn.   Subclinical hyperthyroid/suppressed tsh His mother had history of hyperthyroidism.   H/o iron deficiency He hasn't been taking iron supplements, but trying to eat more foods with iron.  Now that metromenorrhagia has stopped  that he is s/p TAH w/ BSO should be easier to catch up and last iron level 2 mos level was in nml range  He also mentions noticing right eye twitching, improves with clonazepam and at night. He denies eye pain or changes in vision.   He received prednisone from Dr. Beaulah Dinning for rash, no doxycycline.   Past Medical History:  Diagnosis Date  . Anxiety   . Migraines   . Urticaria    Past Surgical History:  Procedure Laterality Date  . CHOLECYSTECTOMY  2014  . CYSTOURETHROSCOPY N/A 08/30/2017   Dr. Bryn Gulling at [facility] Minimally Invasive Gynecologic Surgery  . OOPHORECTOMY Right 2005   found to be PARTIAL right oopherectomy only during 08/2017 total hyst w/ BSO  . TOTAL LAPAROSCOPIC HYSTERECTOMY WITH SALPINGECTOMY Bilateral 08/30/2017   Dr. Bryn Gulling at [facility] Minimally Invasive Gynecologic Surgery; small 1 cm Rt ovarian reminant; no cervical, endometrial, B ovarian, or B fallopian tube pathologic diagnosis inc atypia, dysplasia, or malignancy; no need for continued pap screening   Prior to Admission medications   Medication Sig Start Date End Date Taking? Authorizing Provider  albuterol (PROAIR HFA) 108 (90 Base) MCG/ACT inhaler Inhale 2 puffs into the lungs every 4 (four) hours as needed for wheezing or shortness of breath. 11/20/17   Fletcher Anon, MD  amphetamine-dextroamphetamine (ADDERALL) 15 MG tablet Take 1 tablet by mouth 2 (two) times daily. With  second dose before 2 pm 10/22/17   Sherren Mocha, MD  Ascorbic Acid (VITAMIN C) 500 MG CHEW Chew 500 mg by mouth daily.    [provider]  clindamycin (CLEOCIN T) 1 % lotion Apply topically 2 (two) times daily. 03/17/17   Fletcher Anon, MD  clonazePAM (KLONOPIN) 0.5 MG tablet Take 1 tablet (0.5 mg total) by mouth 2 (two) times daily as needed for anxiety. 09/23/17   Sherren Mocha, MD  cyclobenzaprine (FLEXERIL) 5 MG tablet  10/30/17   [provider]  EPINEPHrine (EPIPEN 2-PAK) 0.3 mg/0.3 mL IJ SOAJ injection Use as  directed for severe allergic reaction. 11/20/17   Fletcher Anon, MD  fluticasone (FLONASE) 50 MCG/ACT nasal spray Place 2 sprays into both nostrils daily as needed (for stuffy nose). 11/20/17   Fletcher Anon, MD  hydrOXYzine (ATARAX/VISTARIL) 25 MG tablet Take 1 tablet (25 mg total) by mouth at bedtime. 11/20/17   Fletcher Anon, MD  hydrOXYzine (ATARAX/VISTARIL) 50 MG tablet Take 1 tablet (50 mg total) by mouth at bedtime as needed. 02/24/17   Fletcher Anon, MD  ibuprofen (ADVIL,MOTRIN) 600 MG tablet  10/19/17   [provider]  levocetirizine (XYZAL) 5 MG tablet Take 1 tablet (5 mg total) by mouth every evening. Patient not taking: Reported on 11/20/2017 03/01/17   Bobbitt, Heywood Iles, MD  montelukast (SINGULAIR) 10 MG tablet One tablet by mouth once a day for coughing or wheezing. 11/20/17   Fletcher Anon, MD  ranitidine (ZANTAC) 150 MG tablet Take one tablet by mouth twice a day for itching. It may help heartburn. Patient not taking: Reported on 11/20/2017 12/30/16   Fletcher Anon, MD  testosterone cypionate (DEPOTESTOSTERONE CYPIONATE) 200 MG/ML injection Inject 0.3 mLs (60 mg total) into the muscle once a week. 09/23/17   Sherren Mocha, MD   Allergies  Allergen Reactions  . Penicillins Anaphylaxis and Rash    Pt can tolerate amoxicillin  Has patient had a PCN reaction causing immediate rash, facial/tongue/throat swelling, SOB or lightheadedness with hypotension: no  Has patient had a PCN reaction causing severe rash involving mucus membranes or skin necrosis: no Has patient had a PCN reaction that required hospitalization: unknown Has patient had a PCN reaction occurring within the last 10 years: no If all of the above answers are "NO", then may proceed with Cephalosporin use.   . Shellfish Allergy Anaphylaxis  . Latex Hives and Rash   Family History  Problem Relation Age of Onset  . Allergic rhinitis Mother   . Sinusitis Mother   . Sinusitis Sister   . Urticaria Sister   .  Breast cancer Sister   . Ovarian cancer Maternal Grandmother   . Breast cancer Maternal Grandmother   . Angioedema Neg Hx   . Asthma Neg Hx   . Eczema Neg Hx   . Immunodeficiency Neg Hx    Social History   Socioeconomic History  . Marital status: Married    Spouse name: Educational psychologist  . Number of children: 0  . Years of education: Not on file  . Highest education level: Bachelor's degree (e.g., BA, AB, BS)  Occupational History  . Occupation: Electrical engineer  . Occupation: Consulting civil engineer    Comment: possible law school starting summer or fall 2019  Social Needs  . Financial resource strain: Not on file  . Food insecurity:    Worry: Not on file    Inability: Not on file  . Transportation needs:  Medical: Not on file    Non-medical: Not on file  Tobacco Use  . Smoking status: Never Smoker  . Smokeless tobacco: Never Used  Substance and Sexual Activity  . Alcohol use: Yes    Comment: rare  . Drug use: No  . Sexual activity: Yes    Partners: Female    Birth control/protection: Other-see comments, Surgical    Comment: pt is trans-male sexually involved with cis-females so no sperm involved in encounters  Lifestyle  . Physical activity:    Days per week: Not on file    Minutes per session: Not on file  . Stress: Not on file  Relationships  . Social connections:    Talks on phone: Not on file    Gets together: Not on file    Attends religious service: Not on file    Active member of club or organization: Not on file    Attends meetings of clubs or organizations: Not on file    Relationship status: Not on file  Other Topics Concern  . Not on file  Social History Narrative   Given name - "Victorino DikeJennifer" [place] - changed 06/2016.   Lives with wife Tommi RumpsChante Walker.   Works Office managerecurity.   [facility] 11/23/2017 09/23/2017 07/31/2017 07/01/2017 03/11/2017  Decreased Interest 0 0 0 0 0  Down, Depressed, Hopeless 0 0 0 0 0  PHQ - 2 Score 0 0 0 0 0    Review of Systems    Constitutional: Negative for chills, fatigue, fever and unexpected weight change.  Eyes:       Right eye twitching  Respiratory: Negative for cough.   Gastrointestinal: Negative for constipation, diarrhea, nausea and vomiting.  Skin: Negative for rash and wound.  Neurological: Negative for dizziness, weakness and headaches.       Objective:   Physical Exam  Constitutional: He is oriented to person, place, and time. He appears well-developed and well-nourished. No distress.  HENT:  Head: Normocephalic and atraumatic.  Eyes: Pupils are equal, round, and reactive to light. EOM are normal.  Neck: Neck supple.  Cardiovascular: Normal rate.  Pulmonary/Chest: Effort normal. No respiratory distress.  Musculoskeletal: Normal range of motion.  Neurological: He is alert and oriented to person, place, and time.  Skin: Skin is warm and dry.  Psychiatric: He has a normal mood and affect. His behavior is normal.  Nursing note and vitals reviewed.   BP 116/82   Pulse 90   Temp 98 F (36.7 C) (Oral)   Resp 18   Ht 5' 3.58" (1.615 m)   Wt 166 lb 12.8 oz (75.7 kg)   SpO2 98%   BMI 29.01 kg/m   Results for orders placed or performed in visit on 11/22/17  CBC with Differential/Platelet  Result Value Ref Range   WBC 11.5 (H) 3.4 - 10.8 x10E3/uL   RBC 5.52 4.14 - 5.80 x10E6/uL   Hemoglobin 15.1 13.0 - 17.7 g/dL   Hematocrit 16.143.0 09.637.5 - 51.0 %   MCV 78 (L) 79 - 97 fL   MCH 27.4 26.6 - 33.0 pg   MCHC 35.1 31.5 - 35.7 g/dL   RDW 04.515.8 (H) 40.912.3 - 81.115.4 %   Platelets 342 150 - 450 x10E3/uL   Neutrophils 78 Not Estab. %   Lymphs 15 Not Estab. %   Monocytes 7 Not Estab. %   Eos 0 Not Estab. %   Basos 0 Not Estab. %   Neutrophils Absolute 8.9 (H) 1.4 - 7.0 x10E3/uL  Lymphocytes Absolute 1.8 0.7 - 3.1 x10E3/uL   Monocytes Absolute 0.7 0.1 - 0.9 x10E3/uL   EOS (ABSOLUTE) 0.0 0.0 - 0.4 x10E3/uL   Basophils Absolute 0.0 0.0 - 0.2 x10E3/uL   Immature Granulocytes 0 Not Estab. %   Immature  Grans (Abs) 0.0 0.0 - 0.1 x10E3/uL  Comprehensive metabolic panel  Result Value Ref Range   Glucose 79 65 - 99 mg/dL   BUN 11 6 - 20 mg/dL   Creatinine, Ser 4.09 0.76 - 1.27 mg/dL   GFR calc non Af Amer 106 >59 mL/min/1.73   GFR calc Af Amer 123 >59 mL/min/1.73   BUN/Creatinine Ratio 12 9 - 20   Sodium 139 134 - 144 mmol/L   Potassium 4.1 3.5 - 5.2 mmol/L   Chloride 101 96 - 106 mmol/L   CO2 25 20 - 29 mmol/L   Calcium 10.1 8.7 - 10.2 mg/dL   Total Protein 7.3 6.0 - 8.5 g/dL   Albumin 4.4 3.5 - 5.5 g/dL   Globulin, Total 2.9 1.5 - 4.5 g/dL   Albumin/Globulin Ratio 1.5 1.2 - 2.2   Bilirubin Total 0.3 0.0 - 1.2 mg/dL   Alkaline Phosphatase 95 39 - 117 IU/L   AST 25 0 - 40 IU/L   ALT 17 0 - 44 IU/L  Estradiol  Result Value Ref Range   Estradiol 34.8 7.6 - 42.6 pg/mL  TSH+T4F+T3Free  Result Value Ref Range   TSH 0.402 (L) 0.450 - 4.500 uIU/mL   T3, Free 2.5 2.0 - 4.4 pg/mL   Free T4 0.97 0.82 - 1.77 ng/dL  TestT+TestF+SHBG  Result Value Ref Range   Testosterone, total WILL FOLLOW    Testosterone, Free WILL FOLLOW    Sex Hormone Binding 41.8 16.5 - 55.9 nmol/L       Assessment & Plan:   patient changed back to 0.4 testosterone and took dose 8 hours prior to lab draw F/u in 2 mos - get labs prior to visit as future orders entered 1. Abnormal TSH   2. Generalized anxiety disorder   3. Attention deficit hyperactivity disorder (ADHD), combined type - drinking a lot of caffeine to augment but likely having side effects so stop/decrease caffeine and will try increase adderall dose instead. discussed potentially changing patient to mydayis since pt is getting up at 4 am.  4. High risk medication use - future lab orders entered to get prior to next visit  5. Microcytosis   6. Long term (current) use of systemic steroids - FTM transgender. Sxs were sig w/ testosterone decrease so Suren had increased dose back up to 0.4 mL (=80mg ) qwk (inj Tues mornings)  7. Encounter for long-term  (current) use of high-risk medication   8. Dental caries into pulp   9. Dental abscess     Orders Placed This Encounter  Procedures  . [id number]    Standing Status:   Future    Standing Expiration Date:   11/24/2018  . TestT+TestF+SHBG    Standing Status:   Future    Standing Expiration Date:   11/24/2018  . Estradiol    Standing Status:   Future    Standing Expiration Date:   11/24/2018  . CBC with Differential/Platelet    Standing Status:   Future    Standing Expiration Date:   11/24/2018  . Comprehensive metabolic panel    Standing Status:   Future    Standing Expiration Date:   11/24/2018  . Ferritin    Standing Status:   Future  Standing Expiration Date:   11/24/2018    Meds ordered this encounter  Medications  . amphetamine-dextroamphetamine (ADDERALL) 20 MG tablet    Sig: Take 1 tablet (20 mg total) by mouth 2 (two) times daily.    Dispense:  60 tablet    Refill:  0  . amphetamine-dextroamphetamine (ADDERALL) 20 MG tablet    Sig: Take 1 tablet (20 mg total) by mouth 2 (two) times daily.    Dispense:  60 tablet    Refill:  0  . clonazePAM (KLONOPIN) 0.5 MG tablet    Sig: Take 1 tablet (0.5 mg total) by mouth 2 (two) times daily as needed for anxiety.    Dispense:  60 tablet    Refill:  1  . chlorhexidine (PERIDEX) 0.12 % solution    Sig: Use as directed 15 mLs in the mouth or throat 2 (two) times daily. Swish, gargle, and spit out as needed to treat/prevent gum abscess    Dispense:  500 mL    Refill:  1    I personally performed the services described in this documentation, which was scribed in my presence. The recorded information has been reviewed and considered, and addended by me as needed.   Norberto Sorenson, M.D.  Primary Care at [facility] [address] [place], [place] [address] [phone] phone [phone] fax  01/14/18 12:59 AM

## 2017-11-23 NOTE — Patient Instructions (Addendum)
STOP CAFFEINE - or decrease to 1 cup a day - I am worried it is causing a lot of your symptoms We will increase the adderall instead to hopefully this will get you up at 4 am instead of 3 cups of coffee.  I recommend coming in to have your blood drawn in a lab-only visit ~3-6 days prior to our next scheduled office visit so that the results are available for us to review together and discuss any implications during your visit.  Try to time the blood draw for the Saturday before your next visit - that was it is 3 days after your testosterone injection.   IF you received an x-ray today, you will receive an invoice from [facility]. Please contact [facility] at [phone] with questions or concerns regarding your invoice.   IF you received labwork today, you will receive an invoice from [facility]. Please contact [facility] at [phone] with questions or concerns regarding your invoice.   Our billing staff will not be able to assist you with questions regarding bills from these companies.  You will be contacted with the lab results as soon as they are available. The fastest way to get your results is to activate your My Chart account. Instructions are located on the last page of this paperwork. If you have not heard from us regarding the results in 2 weeks, please contact this office.

## 2017-11-24 LAB — CBC WITH DIFFERENTIAL/PLATELET
BASOS: 0 %
Basophils Absolute: 0 10*3/uL (ref 0.0–0.2)
EOS (ABSOLUTE): 0 10*3/uL (ref 0.0–0.4)
EOS: 0 %
HEMATOCRIT: 43 % (ref 37.5–51.0)
HEMOGLOBIN: 15.1 g/dL (ref 13.0–17.7)
IMMATURE GRANS (ABS): 0 10*3/uL (ref 0.0–0.1)
IMMATURE GRANULOCYTES: 0 %
LYMPHS: 15 %
Lymphocytes Absolute: 1.8 10*3/uL (ref 0.7–3.1)
MCH: 27.4 pg (ref 26.6–33.0)
MCHC: 35.1 g/dL (ref 31.5–35.7)
MCV: 78 fL — AB (ref 79–97)
MONOCYTES: 7 %
MONOS ABS: 0.7 10*3/uL (ref 0.1–0.9)
NEUTROS PCT: 78 %
Neutrophils Absolute: 8.9 10*3/uL — ABNORMAL HIGH (ref 1.4–7.0)
Platelets: 342 10*3/uL (ref 150–450)
RBC: 5.52 x10E6/uL (ref 4.14–5.80)
RDW: 15.8 % — AB (ref 12.3–15.4)
WBC: 11.5 10*3/uL — AB (ref 3.4–10.8)

## 2017-11-24 LAB — TSH+T4F+T3FREE
Free T4: 0.97 ng/dL (ref 0.82–1.77)
T3, Free: 2.5 pg/mL (ref 2.0–4.4)
TSH: 0.402 u[IU]/mL — AB (ref 0.450–4.500)

## 2017-11-24 LAB — TESTT+TESTF+SHBG
Sex Hormone Binding: 41.8 nmol/L (ref 16.5–55.9)
TESTOSTERONE FREE: 10.7 pg/mL (ref 8.7–25.1)
TESTOSTERONE, TOTAL: 702.5 ng/dL (ref 264.0–916.0)

## 2017-11-24 LAB — COMPREHENSIVE METABOLIC PANEL
ALT: 17 IU/L (ref 0–44)
AST: 25 IU/L (ref 0–40)
Albumin/Globulin Ratio: 1.5 (ref 1.2–2.2)
Albumin: 4.4 g/dL (ref 3.5–5.5)
Alkaline Phosphatase: 95 IU/L (ref 39–117)
BUN/Creatinine Ratio: 12 (ref 9–20)
BUN: 11 mg/dL (ref 6–20)
Bilirubin Total: 0.3 mg/dL (ref 0.0–1.2)
CALCIUM: 10.1 mg/dL (ref 8.7–10.2)
CO2: 25 mmol/L (ref 20–29)
Chloride: 101 mmol/L (ref 96–106)
Creatinine, Ser: 0.93 mg/dL (ref 0.76–1.27)
GFR, EST AFRICAN AMERICAN: 123 mL/min/{1.73_m2} (ref >59)
GFR, EST NON AFRICAN AMERICAN: 106 mL/min/{1.73_m2} (ref >59)
GLOBULIN, TOTAL: 2.9 g/dL (ref 1.5–4.5)
Glucose: 79 mg/dL (ref 65–99)
Potassium: 4.1 mmol/L (ref 3.5–5.2)
SODIUM: 139 mmol/L (ref 134–144)
TOTAL PROTEIN: 7.3 g/dL (ref 6.0–8.5)

## 2017-11-24 LAB — ESTRADIOL: Estradiol: 34.8 pg/mL (ref 7.6–42.6)

## 2017-11-28 ENCOUNTER — Ambulatory Visit: Payer: BLUE CROSS/BLUE SHIELD | Admitting: Family Medicine

## 2017-11-30 DIAGNOSIS — F411 Generalized anxiety disorder: Secondary | ICD-10-CM | POA: Diagnosis not present

## 2017-12-01 ENCOUNTER — Other Ambulatory Visit: Payer: Self-pay

## 2017-12-01 ENCOUNTER — Ambulatory Visit (INDEPENDENT_AMBULATORY_CARE_PROVIDER_SITE_OTHER): Payer: BLUE CROSS/BLUE SHIELD | Admitting: Physician Assistant

## 2017-12-01 ENCOUNTER — Encounter: Payer: Self-pay | Admitting: Physician Assistant

## 2017-12-01 ENCOUNTER — Telehealth: Payer: Self-pay | Admitting: Family Medicine

## 2017-12-01 VITALS — BP 108/68 | HR 93 | Temp 98.0°F | Resp 16 | Wt 165.0 lb

## 2017-12-01 DIAGNOSIS — G8929 Other chronic pain: Secondary | ICD-10-CM | POA: Diagnosis not present

## 2017-12-01 DIAGNOSIS — K047 Periapical abscess without sinus: Secondary | ICD-10-CM

## 2017-12-01 DIAGNOSIS — M25511 Pain in right shoulder: Secondary | ICD-10-CM

## 2017-12-01 MED ORDER — AMOXICILLIN 400 MG/5ML PO SUSR: ORAL | 0 refills | Status: DC

## 2017-12-01 NOTE — Progress Notes (Signed)
Gary Potts Age  MRN: [id number] DOB: July 16, 1980  PCP: Gary Potts, Gary N, MD  Subjective:  Pt is a 37 year old FTM transgendered patient who presents to clinic for several complaints   1) pain of right lower gums - he has seen Dr. Clelia Potts for this problem. He is using chlorhexidine as directed - twice daily. He had $3,000 of dental work to get everything removed last year. He c/o pain and mild swelling of lower outside gums. Painful with eating, however he is able to eat. Denies fever, chills, drainage, cough, difficulty swallowing   2) acute on chronic shoulder pain  - he had a car fall on his chest a few years ago after it fell off an unsteady jack. He has been to physical therapy multiple times "I guess it was physical therapy, all they did was massage me". He has received steroid injections to his right shoulder multiple times. "I tired on the shots. I want to try something else." c/o reduced ROM, pain, muscle tightness. Sometimes he gets really tight from right shoulder to right neck.    Review of Systems  Constitutional: Negative for chills, diaphoresis, fatigue and fever.  HENT: Positive for dental problem. Negative for drooling.   Musculoskeletal: Positive for arthralgias (right shoulder) and myalgias. Negative for joint swelling.  Skin: Negative.     Patient Active Problem List   Diagnosis Date Noted  . Generalized anxiety disorder 07/02/2017  . Attention deficit hyperactivity disorder (ADHD), combined type 07/02/2017  . Benzodiazepine dependence, continuous (HCC) 07/02/2017  . High risk medication use 07/02/2017  . Endocrine disorder 07/02/2017  . Chronic urticaria 01/23/2017  . 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 Reactive airway disease 12/30/2016  . Other allergic rhinitis 12/30/2016  . Dermographia 12/30/2016    Current Outpatient Medications on File Prior to Visit  Medication Sig Dispense Refill  . albuterol (PROAIR HFA) 108 (90 Base) MCG/ACT  inhaler Inhale 2 puffs into the lungs every 4 (four) hours as needed for wheezing or shortness of breath. 1 Inhaler 2  . amphetamine-dextroamphetamine (ADDERALL) 20 MG tablet Take 1 tablet (20 mg total) by mouth 2 (two) times daily. 60 tablet 0  . [START ON 12/23/2017] amphetamine-dextroamphetamine (ADDERALL) 20 MG tablet Take 1 tablet (20 mg total) by mouth 2 (two) times daily. 60 tablet 0  . Ascorbic Acid (VITAMIN C) 500 MG CHEW Chew 500 mg by mouth daily.    . chlorhexidine (PERIDEX) 0.12 % solution Use as directed 15 mLs in the mouth or throat 2 (two) times daily. Swish, gargle, and spit out as needed to treat/prevent gum abscess 500 mL 1  . clindamycin (CLEOCIN T) 1 % lotion Apply topically 2 (two) times daily. 60 mL 2  . clonazePAM (KLONOPIN) 0.5 MG tablet Take 1 tablet (0.5 mg total) by mouth 2 (two) times daily as needed for anxiety. 60 tablet 1  . cyclobenzaprine (FLEXERIL) 5 MG tablet   3  . EPINEPHrine (EPIPEN 2-PAK) 0.3 mg/0.3 mL IJ SOAJ injection Use as directed for severe allergic reaction. 2 Device 1  . fluticasone (FLONASE) 50 MCG/ACT nasal spray Place 2 sprays into both nostrils daily as needed (for stuffy nose). 16 g 5  . hydrOXYzine (ATARAX/VISTARIL) 25 MG tablet Take 1 tablet (25 mg total) by mouth at bedtime. 30 tablet 5  . ibuprofen (ADVIL,MOTRIN) 600 MG tablet   1  . levocetirizine (XYZAL) 5 MG tablet Take 1 tablet (5 mg total) by mouth every evening. 30 tablet 5  .  montelukast (SINGULAIR) 10 MG tablet One tablet by mouth once a day for coughing or wheezing. 34 tablet 5  . ranitidine (ZANTAC) 150 MG tablet Take one tablet by mouth twice a day for itching. It may help heartburn. 60 tablet 5  . testosterone cypionate (DEPOTESTOSTERONE CYPIONATE) 200 MG/ML injection Inject 0.3 mLs (60 mg total) into the muscle once a week. 2 mL 1   Current Facility-Administered Medications on File Prior to Visit  Medication Dose Route Frequency Provider Last Rate Last Dose  . omalizumab [name])  injection 300 mg  300 mg Subcutaneous Q28 days Stephannie Li A, MD   300 mg at 11/23/17 1146    Allergies  Allergen Reactions  . Penicillins Anaphylaxis and Rash    Pt can tolerate amoxicillin  Has patient had a PCN reaction causing immediate rash, facial/tongue/throat swelling, SOB or lightheadedness with hypotension: no  Has patient had a PCN reaction causing severe rash involving mucus membranes or skin necrosis: no Has patient had a PCN reaction that required hospitalization: unknown Has patient had a PCN reaction occurring within the last 10 years: no If all of the above answers are "NO", then may proceed with Cephalosporin use.   . Shellfish Allergy Anaphylaxis  . Latex Hives and Rash     Objective:  BP 108/68   Pulse 93   Temp 98 F (36.7 C) (Oral)   Resp 16   Wt 165 lb (74.8 kg)   SpO2 97%   BMI 28.69 kg/m   Physical Exam  Constitutional: He is oriented to person, place, and time. He appears well-developed and well-nourished.  HENT:  Mouth/Throat: Mucous membranes are normal.    Cardiovascular: Normal rate and regular rhythm.  Neurological: He is alert and oriented to person, place, and time.  Skin: Skin is warm and dry.  Psychiatric: He has a normal mood and affect. His behavior is normal. Judgment and thought content normal.  Vitals reviewed.   Assessment and Plan :  1. Dental infection - Pt c/o worsening gum pain not improving with chlorhexidine rinses. Plan to cover with Amoxil. F/u with Dr. Clelia Croft.  - amoxicillin (AMOXIL) 400 MG/5ML suspension; Take 12.5 mLs (1,000 mg total) by mouth 2 (two) times daily.  Dispense: 250 mL; Refill: 0  2. Chronic right shoulder pain - Pt c/o acute on chronic right shoulder pain. Plan to refer to sports medicine for evaluation.  - Ambulatory referral to Sports Medicine   Gary Collie, PA-C  Primary Care at [facility] 12/01/2017 1:36 PM

## 2017-12-01 NOTE — Telephone Encounter (Signed)
Copied from CRM [id number]. Topic: Quick Communication - Rx Refill/Question >> Dec 01, 2017  9:48 AM Zada GirtLander, Lumin L wrote: Medication: antibiotic similar to liquid amoxicillin (patient was supposed to be prescribed an antibiotic for an abcess in his mouth by Clelia CroftShaw after his last appt but she never did, still causing pain)  Has the patient contacted their pharmacy? Yes.   (Agent: If no, request that the patient contact the pharmacy for the refill.) (Agent: If yes, when and what did the pharmacy advise?)  Preferred Pharmacy (with phone number or street name): [facility] [address] - [place], [place] - [id number] [address] [place] [id number] Phone: [phone] Fax: [phone]  Agent: Please be advised that RX refills may take up to 3 business days. We ask that you follow-up with your pharmacy.

## 2017-12-01 NOTE — Patient Instructions (Addendum)
  Start taking amoxicillin as directed. Continue chlorhexidine as directed - twice daily.   You will receive a phone call to schedule an appointment with sports medicine.  Please follow-up with Dr. Brigitte Pulse.   Thank you for coming in today. I hope you feel we met your needs.  Feel free to call PCP if you have any questions or further requests.  Please consider signing up for MyChart if you do not already have it, as this is a great way to communicate with me.  Best,  Whitney McVey, PA-C   IF you received an x-ray today, you will receive an invoice from [facility]. Please contact [facility] at [phone] with questions or concerns regarding your invoice.   IF you received labwork today, you will receive an invoice from [facility]. Please contact [facility] at [phone] with questions or concerns regarding your invoice.   Our billing staff will not be able to assist you with questions regarding bills from these companies.  You will be contacted with the lab results as soon as they are available. The fastest way to get your results is to activate your My Chart account. Instructions are located on the last page of this paperwork. If you have not heard from [place] regarding the results in 2 weeks, please contact this office.

## 2017-12-01 NOTE — Telephone Encounter (Signed)
Phone call to patient. He states he is using chlorhexidine as directed - twice daily.   Reports the "bubble" in his jaw is bigger. States he had $3000 of dental work to get everything removed last year.   Patient states, "Yesterday I still had a chip of a tooth down there. I don't have dental insurance anymore - not until July."  Recommended patient come in for office visit for evaluation as he is using medications as instructed and his symptoms are not improving/worsening. He is agreeable. Patient transferred up to front desk to schedule.

## 2017-12-05 DIAGNOSIS — F411 Generalized anxiety disorder: Secondary | ICD-10-CM | POA: Diagnosis not present

## 2017-12-07 DIAGNOSIS — F641 Gender identity disorder in adolescence and adulthood: Secondary | ICD-10-CM | POA: Diagnosis not present

## 2017-12-08 ENCOUNTER — Ambulatory Visit (INDEPENDENT_AMBULATORY_CARE_PROVIDER_SITE_OTHER): Payer: BLUE CROSS/BLUE SHIELD | Admitting: Family Medicine

## 2017-12-08 VITALS — BP 118/86 | Ht 64.0 in | Wt 165.0 lb

## 2017-12-08 DIAGNOSIS — M7501 Adhesive capsulitis of right shoulder: Secondary | ICD-10-CM

## 2017-12-08 DIAGNOSIS — M25511 Pain in right shoulder: Secondary | ICD-10-CM

## 2017-12-08 MED ORDER — METHYLPREDNISOLONE ACETATE 40 MG/ML IJ SUSP
40.0000 mg | Freq: Once | INTRAMUSCULAR | Status: AC
  Administered 2017-12-08: 40 mg via INTRA_ARTICULAR

## 2017-12-11 NOTE — Progress Notes (Signed)
    CHIEF COMPLAINT / HPI: Right shoulder pain.  Works as a [employer].  Had a car shift fall while he was working on it.  Is actually impacted his left shoulder and chest area.  He was seen in the emergency room where x-rays were done.  Since that time he has had difficulty with right shoulder movements above 90 degrees causing pain.  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  It is impacting his job.  REVIEW OF SYSTEMS: No right upper extremity numbness or tingling.  No hand numbness or tingling on the right.  No fever.  No unusual weight change.  PERTINENT  PMH / PSH: I have reviewed the patient's medications, allergies, past medical and surgical history, smoking status and updated in the EMR as appropriate. Transgender male  OBJECTIVE: General: Well-developed no acute distress SHOULDER: Left: Full range of motion and strength in all planes the rotator cuff.  Right shoulder: Full strength in all planes the rotator cuff decreased range of motion in external rotation abduction above 90 degrees secondary to pain and stiffness. neuro: Intact soft touch sensation bilateral hands VASCULAR: Radial pulses 2+ bilaterally symmetrical skin: Skin the area around the right shoulder is without any   PROCEDURE: INJECTION: Patient was given informed consent, signed copy in the chart. Appropriate time out was taken. Area prepped and draped in usual sterile fashion. Ethyl chloride was  used for local anesthesia. A 21 Ahren 1 1/2 inch needle was used.. 1 cc of methylprednisolone 40 mg/ml plus 4 cc of 1% lidocaine without epinephrine was injected into the right subacromial bursa of the right shoulder using a(n) posterior approach.   The patient tolerated the procedure well. There were no complications. Post procedure instructions were given. unusual rash.  ASSESSMENT / PLAN: Shoulder pain and stiffness.  This may be early adhesive capsulitis.  There is definitely component of stiffness.  The pain is mostly coming  from rotator cuff tendinopathy.  We will start with a subacromial bursa injection and home exercise program follow-up 4 weeks.

## 2017-12-12 DIAGNOSIS — F411 Generalized anxiety disorder: Secondary | ICD-10-CM | POA: Diagnosis not present

## 2017-12-18 ENCOUNTER — Telehealth: Payer: Self-pay | Admitting: Family Medicine

## 2017-12-18 NOTE — Telephone Encounter (Signed)
Copied from CRM [id number]. Topic: Quick Communication - See Telephone Encounter >> Dec 18, 2017 10:32 AM Tamela OddiMartin, Gary, NT wrote: CRM for notification. See Telephone encounter for: 12/18/17. Patient called and is requesting an early refill auth by Dr. Clelia CroftShaw on his amphetamine-dextroamphetamine (ADDERALL) 20 MG tablet . Patient states he is going out of town that day and is wondering if a refill request can be put in sooner.   [facility] [address] - [place], [place] - [id number] [phone] (Phone) [phone] (Fax)

## 2017-12-19 ENCOUNTER — Telehealth: Payer: Self-pay

## 2017-12-19 DIAGNOSIS — F411 Generalized anxiety disorder: Secondary | ICD-10-CM | POA: Diagnosis not present

## 2017-12-19 NOTE — Telephone Encounter (Signed)
Pt called back and is aware of the early refill approved and Rx is at [facility]

## 2017-12-19 NOTE — Telephone Encounter (Signed)
Per Clelia CroftShaw ok'd early refill of adderall 20 mg #60 with no refills called in to [facility].  Confirmed and will get ready for pt.  Tried notifying pt via phone- no answer-calling to advise early refill on adderall approved. Dgaddy,CMA

## 2017-12-19 NOTE — Telephone Encounter (Signed)
Message re: early refill Adderall sent to D.[facility] Clelia CroftShaw

## 2017-12-20 ENCOUNTER — Ambulatory Visit (INDEPENDENT_AMBULATORY_CARE_PROVIDER_SITE_OTHER): Payer: BLUE CROSS/BLUE SHIELD

## 2017-12-20 DIAGNOSIS — L501 Idiopathic urticaria: Secondary | ICD-10-CM

## 2017-12-20 DIAGNOSIS — L508 Other urticaria: Secondary | ICD-10-CM

## 2017-12-21 MED ORDER — AMPHETAMINE-DEXTROAMPHETAMINE 20 MG PO TABS: 20.0000 mg | ORAL_TABLET | Freq: Two times a day (BID) | ORAL | 0 refills | Status: DC

## 2017-12-21 NOTE — Telephone Encounter (Signed)
Sent in new adderall rx on 7/4 as ok to fill today and asked pharmacist to call pt to let him now rx was ready to be picked up and to d/c the prior rx for 7/6 that was on file for pt.

## 2017-12-25 ENCOUNTER — Ambulatory Visit: Payer: BLUE CROSS/BLUE SHIELD | Admitting: Family Medicine

## 2017-12-26 DIAGNOSIS — F411 Generalized anxiety disorder: Secondary | ICD-10-CM | POA: Diagnosis not present

## 2017-12-28 DIAGNOSIS — F411 Generalized anxiety disorder: Secondary | ICD-10-CM | POA: Diagnosis not present

## 2017-12-29 ENCOUNTER — Ambulatory Visit: Payer: BLUE CROSS/BLUE SHIELD | Admitting: Physician Assistant

## 2017-12-29 ENCOUNTER — Telehealth: Payer: Self-pay | Admitting: Family Medicine

## 2017-12-29 ENCOUNTER — Ambulatory Visit: Payer: Self-pay

## 2017-12-29 ENCOUNTER — Ambulatory Visit (INDEPENDENT_AMBULATORY_CARE_PROVIDER_SITE_OTHER): Payer: BLUE CROSS/BLUE SHIELD | Admitting: Family Medicine

## 2017-12-29 VITALS — BP 112/84 | Ht 64.0 in | Wt 165.0 lb

## 2017-12-29 DIAGNOSIS — M25511 Pain in right shoulder: Secondary | ICD-10-CM

## 2017-12-29 MED ORDER — METHYLPREDNISOLONE ACETATE 40 MG/ML IJ SUSP
40.0000 mg | Freq: Once | INTRAMUSCULAR | Status: AC
  Administered 2017-12-29: 40 mg via INTRA_ARTICULAR

## 2017-12-29 NOTE — Progress Notes (Deleted)
   History was provided by the {relatives:[id number]}.  Gary Potts is a 37 y.o. adult who is here for ***.     HPI:  ***  Pain increasing   Injection lasted only 1-2 days. Is trying to do exercises  Last seen in sports med on 6/21 after shoulder injury at work. Thought to be early adhesive capsulitis with pain coming from rotator cuff tendinopathy.  Review of systems:  As stated above   Interval past medical history, surgical history, family history, and social history obtained and reviewed.   Patient Active Problem List   Diagnosis Date Noted  . Generalized anxiety disorder 07/02/2017  . Attention deficit hyperactivity disorder (ADHD), combined type 07/02/2017  . Benzodiazepine dependence, continuous (HCC) 07/02/2017  . High risk medication use 07/02/2017  . Endocrine disorder 07/02/2017  . Chronic urticaria 01/23/2017  . 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 Reactive airway disease 12/30/2016  . Other allergic rhinitis 12/30/2016  . Dermographia 12/30/2016    Physical Exam:  BP 112/84   Ht 5\' 4"  (1.626 m)   Wt 165 lb (74.8 kg)   BMI 28.32 kg/m   Growth percentile SmartLinks can only be used for patients less than 36 years old. No LMP recorded. Patient has had a hysterectomy.    Physical Exam  Assessment/Plan:   Follow up:   Annell GreeningPaige Summer Mccolgan, MD, MS [facility] PGY3

## 2017-12-29 NOTE — Progress Notes (Signed)
[facility] Attending Note: I have seen and examined this patient. I have discussed this patient with the resident and reviewed the assessment and plan as documented above. I agree with the resident's findings and plan. He has started to develop adhesive capsulitis on the right.  Subacromial bursa shot helped initially but even with that he is had decrease in his range of motion.  Ultrasound today was consistent with adhesive capsulitis.  We discussed to perform ultrasound-guided glenohumeral joint corticosteroid injection.  Aggressive exercise program for stretching rather than strengthening of the rotator cuff follow-up 3 to 4 weeks.  I would recommend we follow with serial glenohumeral joint injections the next couple of visits and as long as he is improving we do not need to do further imaging.

## 2017-12-29 NOTE — Telephone Encounter (Signed)
Copied from CRM [id number]. Topic: Quick Communication - See Telephone Encounter >> Dec 29, 2017  2:14 PM Oneal GroutSebastian, Jennifer S wrote: CRM for notification. See Telephone encounter for: 12/29/17. FYI Patient was seen today by Dr Jennette KettleNeal with [facility], was dx with right frozen shoulder, treatment has been started. Received prednisone injection today.

## 2017-12-29 NOTE — Progress Notes (Signed)
   Subjective:    [facility]    DOB: 12-18-1980, 37 y.o.   MRN: [id number]  HPI Patient is a 37 year old male who presents today to follow-up on right shoulder pain.  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  Patient reports that he received an injection at last office visit which made the pain better few days.  Since then patient has had worsening pain and significant limitation in range of motion.  Patient continues to do exercises as instructed.  Patient was also sent to physical therapy and does not think that it has helped.  Patient also reports some numbness and tingling from his shoulder traveling down to his fingers and hand.  Patient has used Aleve with minimal improvement in symptoms.  Patient denies any acute injury or trauma since last office visit.  Review of Systems Right upper extremity numbness and tingling from shoulder to hand.  Shoulder pain.    Objective:   Physical Exam Inspection reveals no abnormalities, swelling, erythema, atrophy or bruising.  Patient is tender with palpation around bicipital groove.  Very limited ROM in all planes ( Flex, ABD, cross, ext, int, winging scapular, scapular dyskinesia,) Rotator cuff strength 4/5. Pain elicited with empty can, lift off and drop arm Positive Neer and Hawkin's tests.  Positive speeds and Yergason's No labral pathology noted with negative Obrien's, negative clunk and good stability.   Positive apprehension sign Sensation intact   Palpable pulses     Assessment & Plan:  Ultrasound right shoulder: Evidence of adhesion in the glenohumeral joint which would be consistent with exam finding had a suspicious for capsulitis.  No signs of tear in the rotator cuff muscles/tendon sprain imaging.  Some fluid consistent with inflammation was also noted.  #Right shoulder pain, chronic, worsening Patient presented with worsening right shoulder pain with  significant decrease in range of motion concerning for adhesive capsulitis.  Patient was seen 3 weeks ago and had a subacromial bursa injection.  Patient was also given home exercises that he has been doing.  Initial improvement with steroid injection, however pain has returned which has limited his range of motion and caused worsening adhesive capsulitis.  Exam is consistent with decreased range of motion, patient is unable to pass 90 degree angle on abduction,abduction, flexion and extension.  Special tests are all positive for rotator cuff tendinopathy.  Ultrasound findings today has showed evidence of lesion initial joint.  Patient received a glenohumeral joint corticosteroid injection today under ultrasound guidance.  Patient will continue with aggressive home exercises to strengthen rotator cuff.  Patient would benefit from multiple injection in the weeks to come.  Patient will follow-up in clinic for reassessment in 4 weeks.

## 2018-01-02 DIAGNOSIS — F411 Generalized anxiety disorder: Secondary | ICD-10-CM | POA: Diagnosis not present

## 2018-01-04 DIAGNOSIS — F411 Generalized anxiety disorder: Secondary | ICD-10-CM | POA: Diagnosis not present

## 2018-01-08 DIAGNOSIS — F411 Generalized anxiety disorder: Secondary | ICD-10-CM | POA: Diagnosis not present

## 2018-01-10 DIAGNOSIS — L501 Idiopathic urticaria: Secondary | ICD-10-CM | POA: Diagnosis not present

## 2018-01-11 DIAGNOSIS — F411 Generalized anxiety disorder: Secondary | ICD-10-CM | POA: Diagnosis not present

## 2018-01-17 ENCOUNTER — Ambulatory Visit (INDEPENDENT_AMBULATORY_CARE_PROVIDER_SITE_OTHER): Payer: BLUE CROSS/BLUE SHIELD

## 2018-01-17 DIAGNOSIS — L501 Idiopathic urticaria: Secondary | ICD-10-CM

## 2018-01-18 ENCOUNTER — Ambulatory Visit (INDEPENDENT_AMBULATORY_CARE_PROVIDER_SITE_OTHER): Payer: BLUE CROSS/BLUE SHIELD | Admitting: Family Medicine

## 2018-01-18 DIAGNOSIS — R718 Other abnormality of red blood cells: Secondary | ICD-10-CM

## 2018-01-18 DIAGNOSIS — Z79899 Other long term (current) drug therapy: Secondary | ICD-10-CM

## 2018-01-18 DIAGNOSIS — E349 Endocrine disorder, unspecified: Secondary | ICD-10-CM | POA: Diagnosis not present

## 2018-01-18 DIAGNOSIS — F411 Generalized anxiety disorder: Secondary | ICD-10-CM | POA: Diagnosis not present

## 2018-01-18 DIAGNOSIS — N939 Abnormal uterine and vaginal bleeding, unspecified: Secondary | ICD-10-CM

## 2018-01-18 DIAGNOSIS — D751 Secondary polycythemia: Secondary | ICD-10-CM

## 2018-01-18 NOTE — Progress Notes (Signed)
Lab only visit 

## 2018-01-19 ENCOUNTER — Telehealth: Payer: Self-pay

## 2018-01-19 ENCOUNTER — Ambulatory Visit: Payer: Self-pay

## 2018-01-19 ENCOUNTER — Ambulatory Visit (INDEPENDENT_AMBULATORY_CARE_PROVIDER_SITE_OTHER): Payer: BLUE CROSS/BLUE SHIELD | Admitting: Family Medicine

## 2018-01-19 VITALS — BP 122/86 | Ht 64.0 in | Wt 165.0 lb

## 2018-01-19 DIAGNOSIS — M25511 Pain in right shoulder: Secondary | ICD-10-CM

## 2018-01-19 DIAGNOSIS — M7501 Adhesive capsulitis of right shoulder: Secondary | ICD-10-CM

## 2018-01-19 MED ORDER — DICLOFENAC SODIUM 1 % TD GEL: 2.0000 g | Freq: Two times a day (BID) | TRANSDERMAL | 5 refills | Status: DC | PRN

## 2018-01-19 MED ORDER — METHYLPREDNISOLONE ACETATE 40 MG/ML IJ SUSP
40.0000 mg | Freq: Once | INTRAMUSCULAR | Status: AC
  Administered 2018-01-19: 40 mg via INTRA_ARTICULAR

## 2018-01-19 NOTE — Assessment & Plan Note (Signed)
Initially underwent subacromial bursa injection with minimal improvement.  At last office visit first corticosteroid injection into the glenohumeral joint.  Today second of multiple serial glenohumeral joint injections.  High-volume today.  Encouraged range of motion exercises, aggressive.  Follow-up 3 weeks.

## 2018-01-19 NOTE — Telephone Encounter (Signed)
Received fax from [facility] requesting prior authorization of Diclofenac gel. Clinical information submitted via CoverMyMeds. Response may take up to three business days.   Key: [id number] - Rx #: [id number]  Ples SpecterAlisa Brake, RN [facility] RN)

## 2018-01-19 NOTE — Progress Notes (Signed)
  [place] - 37 y.o. adult MRN [id number]  Date of birth: 1981/06/03    SUBJECTIVE:      Chief Complaint:/ HPI:  Follow-up adhesive capsulitis right shoulder Glenohumeral joint injection at last office visit.  Was significantly better for about a week and then stiffness and lack of range of motion have returned.  He was quite excited initially now he is a little frustrated.   ROS:     No fever.  No numbness in the right upper extremity.  No loss of grip strength.  PERTINENT  PMH / PSH FH / / SH:  Past Medical, Surgical, Social, and Family History Reviewed & Updated in the EMR.  Pertinent findings include:  Non-smoker  OBJECTIVE: BP 122/86   Ht 5\' 4"  (1.626 m)   Wt 165 lb (74.8 kg)   BMI 28.32 kg/m   Physical Exam:  Vital signs are reviewed. GENERAL: Well-developed male no acute distress SHOULDER: Right.  External rotation is about 50% of that of the left.  Abduction and forward flexion to 100 degrees.  Shoulder is nontender to palpation. VASCULAR: Radial pulses 2+ bilaterally symmetrical NEURO: Intact sensation soft touch and with normal grip strength bilateral hands.  PROCEDURE: INJECTION: Patient was given informed consent, signed copy in the chart. Appropriate time out was taken. Area prepped and draped in usual sterile fashion. Ethyl chloride was  used for local anesthesia. A 21 Calvin 1 1/2 inch needle was used.. 1 cc of methylprednisolone 40 mg/ml plus 9 cc of 1% lidocaine without epinephrine was injected into the right glenohumeral joint using a(n) ultrasound-guided posterior approach.   The patient tolerated the procedure well. There were no complications. Post procedure instructions were given.   ASSESSMENT & PLAN:  See problem based charting & AVS for pt instructions.

## 2018-01-19 NOTE — Telephone Encounter (Signed)
[facility], not [facility], sent PA.  Ples SpecterAlisa Brake, RN [facility] RN)

## 2018-01-22 ENCOUNTER — Encounter: Payer: Self-pay | Admitting: Family Medicine

## 2018-01-22 ENCOUNTER — Other Ambulatory Visit: Payer: Self-pay

## 2018-01-22 ENCOUNTER — Encounter: Payer: BLUE CROSS/BLUE SHIELD | Admitting: Family Medicine

## 2018-01-22 NOTE — Patient Instructions (Signed)
     IF you received an x-ray today, you will receive an invoice from [facility]. Please contact [facility] at [phone] with questions or concerns regarding your invoice.   IF you received labwork today, you will receive an invoice from [facility]. Please contact [facility] at [phone] with questions or concerns regarding your invoice.   Our billing staff will not be able to assist you with questions regarding bills from these companies.  You will be contacted with the lab results as soon as they are available. The fastest way to get your results is to activate your My Chart account. Instructions are located on the last page of this paperwork. If you have not heard from us regarding the results in 2 weeks, please contact this office.     

## 2018-01-23 DIAGNOSIS — F411 Generalized anxiety disorder: Secondary | ICD-10-CM | POA: Diagnosis not present

## 2018-01-24 LAB — CBC WITH DIFFERENTIAL/PLATELET
BASOS ABS: 0 10*3/uL (ref 0.0–0.2)
Basos: 0 %
EOS (ABSOLUTE): 0.1 10*3/uL (ref 0.0–0.4)
Eos: 1 %
Hematocrit: 48.3 % (ref 37.5–51.0)
Hemoglobin: 16.2 g/dL (ref 13.0–17.7)
Immature Grans (Abs): 0 10*3/uL (ref 0.0–0.1)
Immature Granulocytes: 0 %
Lymphocytes Absolute: 1.5 10*3/uL (ref 0.7–3.1)
Lymphs: 22 %
MCH: 26.6 pg (ref 26.6–33.0)
MCHC: 33.5 g/dL (ref 31.5–35.7)
MCV: 79 fL (ref 79–97)
Monocytes Absolute: 0.4 10*3/uL (ref 0.1–0.9)
Monocytes: 7 %
Neutrophils Absolute: 4.8 10*3/uL (ref 1.4–7.0)
Neutrophils: 70 %
Platelets: 336 10*3/uL (ref 150–450)
RBC: 6.08 x10E6/uL — ABNORMAL HIGH (ref 4.14–5.80)
RDW: 16.9 % — ABNORMAL HIGH (ref 12.3–15.4)
WBC: 6.8 10*3/uL (ref 3.4–10.8)

## 2018-01-24 LAB — TESTT+TESTF+SHBG
SEX HORMONE BINDING: 46.9 nmol/L (ref 16.5–55.9)
TESTOSTERONE FREE: 17.8 pg/mL (ref 8.7–25.1)
Testosterone, total: 1088.9 ng/dL — ABNORMAL HIGH (ref 264.0–916.0)

## 2018-01-24 LAB — COMPREHENSIVE METABOLIC PANEL
ALBUMIN: 4.7 g/dL (ref 3.5–5.5)
ALT: 15 IU/L (ref 0–44)
AST: 21 IU/L (ref 0–40)
Albumin/Globulin Ratio: 1.6 (ref 1.2–2.2)
Alkaline Phosphatase: 98 IU/L (ref 39–117)
BUN / CREAT RATIO: 13 (ref 9–20)
BUN: 13 mg/dL (ref 6–20)
Bilirubin Total: 0.3 mg/dL (ref 0.0–1.2)
CO2: 26 mmol/L (ref 20–29)
CREATININE: 1.02 mg/dL (ref 0.76–1.27)
Calcium: 9.9 mg/dL (ref 8.7–10.2)
Chloride: 99 mmol/L (ref 96–106)
GFR, EST AFRICAN AMERICAN: 110 mL/min/{1.73_m2} (ref >59)
GFR, EST NON AFRICAN AMERICAN: 95 mL/min/{1.73_m2} (ref >59)
Globulin, Total: 3 g/dL (ref 1.5–4.5)
Glucose: 84 mg/dL (ref 65–99)
Potassium: 4.7 mmol/L (ref 3.5–5.2)
Sodium: 140 mmol/L (ref 134–144)
Total Protein: 7.7 g/dL (ref 6.0–8.5)

## 2018-01-24 LAB — IRON,TIBC AND FERRITIN PANEL
Ferritin: 31 ng/mL (ref 30–400)
Iron Saturation: 19 % (ref 15–55)
Iron: 72 ug/dL (ref 38–169)
TIBC: 373 ug/dL (ref 250–450)
UIBC: 301 ug/dL (ref 111–343)

## 2018-01-24 LAB — ESTRADIOL: ESTRADIOL: 34.1 pg/mL (ref 7.6–42.6)

## 2018-01-24 NOTE — Telephone Encounter (Signed)
Prior approval for Diclofenac gel approved for 01/19/18 - 06/19/38. [facility] pharmacy informed.  Ples SpecterAlisa Britnee Mcdevitt, RN [facility] RN)

## 2018-01-25 ENCOUNTER — Encounter: Payer: Self-pay | Admitting: Family Medicine

## 2018-01-25 ENCOUNTER — Other Ambulatory Visit: Payer: Self-pay | Admitting: Pediatrics

## 2018-01-25 ENCOUNTER — Ambulatory Visit (INDEPENDENT_AMBULATORY_CARE_PROVIDER_SITE_OTHER): Payer: BLUE CROSS/BLUE SHIELD | Admitting: Family Medicine

## 2018-01-25 ENCOUNTER — Other Ambulatory Visit: Payer: Self-pay

## 2018-01-25 VITALS — BP 114/70 | HR 108 | Temp 98.9°F | Resp 16 | Ht 63.9 in | Wt 168.4 lb

## 2018-01-25 DIAGNOSIS — E0590 Thyrotoxicosis, unspecified without thyrotoxic crisis or storm: Secondary | ICD-10-CM | POA: Diagnosis not present

## 2018-01-25 DIAGNOSIS — F411 Generalized anxiety disorder: Secondary | ICD-10-CM

## 2018-01-25 DIAGNOSIS — F902 Attention-deficit hyperactivity disorder, combined type: Secondary | ICD-10-CM

## 2018-01-25 DIAGNOSIS — R002 Palpitations: Secondary | ICD-10-CM | POA: Diagnosis not present

## 2018-01-25 DIAGNOSIS — F39 Unspecified mood [affective] disorder: Secondary | ICD-10-CM

## 2018-01-25 DIAGNOSIS — Z79899 Other long term (current) drug therapy: Secondary | ICD-10-CM

## 2018-01-25 DIAGNOSIS — R632 Polyphagia: Secondary | ICD-10-CM | POA: Diagnosis not present

## 2018-01-25 DIAGNOSIS — R454 Irritability and anger: Secondary | ICD-10-CM

## 2018-01-25 LAB — POCT URINALYSIS DIP (MANUAL ENTRY)
BILIRUBIN UA: NEGATIVE
Glucose, UA: NEGATIVE mg/dL
Ketones, POC UA: NEGATIVE mg/dL
NITRITE UA: NEGATIVE
PH UA: 6.5 (ref 5.0–8.0)
PROTEIN UA: NEGATIVE mg/dL
Spec Grav, UA: 1.01 (ref 1.010–1.025)
Urobilinogen, UA: 0.2 E.U./dL

## 2018-01-25 MED ORDER — CLONAZEPAM 0.5 MG PO TABS: 0.5000 mg | ORAL_TABLET | Freq: Two times a day (BID) | ORAL | 1 refills | Status: DC | PRN

## 2018-01-25 MED ORDER — AMPHETAMINE-DEXTROAMPHETAMINE 20 MG PO TABS: 20.0000 mg | ORAL_TABLET | Freq: Two times a day (BID) | ORAL | 0 refills | Status: DC

## 2018-01-25 MED ORDER — GABAPENTIN 300 MG/6ML PO SOLN: 6.0000 mL | Freq: Every day | ORAL | 0 refills | Status: DC

## 2018-01-25 MED ORDER — PROPRANOLOL HCL 40 MG PO TABS: 40.0000 mg | ORAL_TABLET | ORAL | 0 refills | Status: DC | PRN

## 2018-01-25 MED ORDER — TESTOSTERONE CYPIONATE 200 MG/ML IM SOLN: 60.0000 mg | INTRAMUSCULAR | 1 refills | Status: DC

## 2018-01-25 NOTE — Progress Notes (Signed)
Subjective:    Patient ID: [facility], adult    DOB: 14-Jun-1981, 37 y.o.   MRN: [id number] Chief Complaint  Patient presents with  . recheck from 11/23/17 visit    2 month f/u    HPI  ADD: increased adderall 15 bid to 20 bid at last visit and decreased caffeine, doing well - feels that add is much better controlled and multiple people, therapist, have remarked to him that his add is doing [facility] better - really likes current regimen.  FTM gender-affirming hormone therapy: was prev on testosterone 0.84ml qwk (injection Tues morning) but cut self down to 0.26ml qwk about 6 wks ago and feels better on this dose - doing great, does not want to change. Very confused why last labs which were drawn on Thurs - 2d after injection  - drawn 1 wk ago - showed T level as on supratherapeutic side.  Anxiety: on klonopin doesn't tolerate buspar made it worse.  Last wk had an inciodent when - doesn't take much to set him off  Tossing and turning to go to sleep, shaking, rocking leg, trouble going to sleep.     Review of Systems  Respiratory: Negative for shortness of breath and wheezing.   Cardiovascular: Positive for palpitations. Negative for chest pain and leg swelling.  Endocrine: Positive for polyphagia.  Genitourinary: Negative for decreased urine volume, difficulty urinating, dysuria, enuresis and urgency.  Allergic/Immunologic: Positive for environmental allergies. Negative for immunocompromised state.  Neurological: Positive for tremors, light-headedness and headaches. Negative for seizures, syncope, facial asymmetry, speech difficulty and weakness.  Psychiatric/Behavioral: Positive for agitation, behavioral problems, decreased concentration, dysphoric mood and sleep disturbance. Negative for confusion, hallucinations, self-injury and suicidal ideas. The patient is nervous/anxious and is hyperactive.    See hpi    Objective:   Physical Exam  Constitutional: He is oriented to person, place,  and time. He appears well-developed and well-nourished. No distress.  HENT:  Head: Normocephalic and atraumatic.  Right Ear: External ear normal.  Left Ear: External ear normal.  Eyes: Conjunctivae are normal. No scleral icterus.  Neck: Normal range of motion. Neck supple. No thyromegaly present.  Cardiovascular: Normal rate, regular rhythm, normal heart sounds and intact distal pulses.  Pulmonary/Chest: Effort normal and breath sounds normal. No respiratory distress.  Musculoskeletal: He exhibits no edema.  Lymphadenopathy:    He has no cervical adenopathy.  Neurological: He is alert and oriented to person, place, and time.  Skin: Skin is warm and dry. He is not diaphoretic. No erythema.  Psychiatric: He has a normal mood and affect. His behavior is normal.   BP 114/70 (BP Location: Left Arm, Patient Position: Sitting, Cuff Size: Normal)   Pulse (!) 108   Temp 98.9 F (37.2 C) (Oral)   Resp 16   Ht 5' 3.9" (1.623 m)   Wt 168 lb 6.4 oz (76.4 kg)   SpO2 96%   BMI 29.00 kg/m      Assessment & Plan:  Over 40 min spent in face-to-face evaluation of and consultation with patient and coordination of care.  Over 50% of this time was spent counseling this patient regarding stuff.  1. Hyperthyroidism   2. Palpitations   3. Encounter for long-term current use of high risk medication - pt had decreased his testosterone dose own own again down to 0.29mL qwk = 60mg  - and feels much better at this dose. Prior supratherapeutic level was drawn just 4 wks after dose decrease from 0.26ml (=80mg ) q wk and  was 2d after injection so was peak and to soon after dose decrease to check). Pt will RTC for lab only in 2d to have T levels rechecked now that has been on new dose of 0.453ml q wk x >6 wks and willl be 4d after injection to ensure in therapeutic range.  4. Polyphagia   5. Generalized anxiety disorder   6. Attention deficit hyperactivity disorder (ADHD), combined type - feels that sxs are very well  controlled on adderall 20 bid.  7.      Irritability and anger - pt very adament that this is NOT related to increase in adderall but of course that is still a concern of mine. Pt does describe physical adreneline sxs during episodic acute anger outbursts so r/o pheochromacytoma/adrenal dysfxn w labs and 24 hrs urine.  Try trxing adrenaline "fight or flight" physical and psychologic sxs w/ prn propranolol - take as early as poss as slower acting - may need to play w/ dose. Pt worked hard to decrease bzd dose but states therapist rec consider increasing - will leave at same dose but currently taking dose qhs to help sleep - will elminate that need and try something else for sleep - pt reports being on liquid gabapentin qhs prior with good success - thinks was only d/c'd as wasn't needing any more - would like to retry that for sleep and thereby freeing up the prior second klonopin 0.5mg  dose to be used earlier in day to help decrease anxiety/flying off the handle. 8.      Episodic mood disorder - if does not improve, consider mood stabilizer and may need to decrease stimulant dose (pt reluctant to try that now)  Orders Placed This Encounter  Procedures  . TSH+T4F+T3Free    Standing Status:   Future    Number of Occurrences:   1    Standing Expiration Date:   01/26/2019  . Thyroid antibodies    Standing Status:   Future    Number of Occurrences:   1    Standing Expiration Date:   01/26/2019  . Thyroid Stimulating Immunoglobulin    Standing Status:   Future    Number of Occurrences:   1    Standing Expiration Date:   01/26/2019  . Catecholamine+VMA, 24-Hr Urine  . TestT+TestF+SHBG    Standing Status:   Future    Number of Occurrences:   1    Standing Expiration Date:   01/26/2019  . Vitamin B12    Standing Status:   Future    Number of Occurrences:   1    Standing Expiration Date:   01/26/2019  . Hemoglobin A1c    Standing Status:   Future    Number of Occurrences:   1    Standing Expiration Date:    01/26/2019  . POCT urinalysis dipstick    Meds ordered this encounter  Medications  . propranolol (INDERAL) 40 MG tablet    Sig: Take 1 tablet (40 mg total) by mouth every 4 (four) hours as needed (palpitations, tremor, anxiety).    Dispense:  60 tablet    Refill:  0  . Gabapentin 300 MG/6ML SOLN    Sig: Take 6 mLs by mouth at bedtime.    Dispense:  180 mL    Refill:  0  . amphetamine-dextroamphetamine (ADDERALL) 20 MG tablet    Sig: Take 1 tablet (20 mg total) by mouth 2 (two) times daily.    Dispense:  60 tablet  Refill:  0  . clonazePAM (KLONOPIN) 0.5 MG tablet    Sig: Take 1 tablet (0.5 mg total) by mouth 2 (two) times daily as needed for anxiety.    Dispense:  60 tablet    Refill:  1  . amphetamine-dextroamphetamine (ADDERALL) 20 MG tablet    Sig: Take 1 tablet (20 mg total) by mouth 2 (two) times daily.    Dispense:  60 tablet    Refill:  0  . testosterone cypionate (DEPOTESTOSTERONE CYPIONATE) 200 MG/ML injection    Sig: Inject 0.3 mLs (60 mg total) into the muscle once a week.    Dispense:  2 mL    Refill:  1    Norberto Sorenson, M.D.  Primary Care at [facility] [address] [place], [place] [address] [phone] phone [phone] fax  02/02/18 11:42 AM

## 2018-01-25 NOTE — Patient Instructions (Signed)
     IF you received an x-ray today, you will receive an invoice from [facility]. Please contact [facility] at [phone] with questions or concerns regarding your invoice.   IF you received labwork today, you will receive an invoice from [facility]. Please contact [facility] at [phone] with questions or concerns regarding your invoice.   Our billing staff will not be able to assist you with questions regarding bills from these companies.  You will be contacted with the lab results as soon as they are available. The fastest way to get your results is to activate your My Chart account. Instructions are located on the last page of this paperwork. If you have not heard from us regarding the results in 2 weeks, please contact this office.     

## 2018-01-26 DIAGNOSIS — F411 Generalized anxiety disorder: Secondary | ICD-10-CM | POA: Diagnosis not present

## 2018-01-27 ENCOUNTER — Ambulatory Visit (INDEPENDENT_AMBULATORY_CARE_PROVIDER_SITE_OTHER): Payer: BLUE CROSS/BLUE SHIELD | Admitting: Urgent Care

## 2018-01-27 DIAGNOSIS — R632 Polyphagia: Secondary | ICD-10-CM

## 2018-01-27 DIAGNOSIS — R002 Palpitations: Secondary | ICD-10-CM

## 2018-01-27 DIAGNOSIS — E0590 Thyrotoxicosis, unspecified without thyrotoxic crisis or storm: Secondary | ICD-10-CM

## 2018-01-27 DIAGNOSIS — Z793 Long term (current) use of hormonal contraceptives: Secondary | ICD-10-CM | POA: Diagnosis not present

## 2018-01-27 DIAGNOSIS — Z8639 Personal history of other endocrine, nutritional and metabolic disease: Secondary | ICD-10-CM | POA: Diagnosis not present

## 2018-01-27 DIAGNOSIS — R790 Abnormal level of blood mineral: Secondary | ICD-10-CM | POA: Diagnosis not present

## 2018-01-27 DIAGNOSIS — Z79899 Other long term (current) drug therapy: Secondary | ICD-10-CM

## 2018-01-27 NOTE — Progress Notes (Signed)
Nurse visit, patient was not seen.

## 2018-01-30 DIAGNOSIS — F411 Generalized anxiety disorder: Secondary | ICD-10-CM | POA: Diagnosis not present

## 2018-01-30 LAB — THYROID ANTIBODIES: THYROID PEROXIDASE ANTIBODY: 13 [IU]/mL (ref 0–34)

## 2018-01-30 LAB — TESTT+TESTF+SHBG
SEX HORMONE BINDING: 33.8 nmol/L (ref 16.5–55.9)
TESTOSTERONE FREE: 11.5 pg/mL (ref 8.7–25.1)
Testosterone, total: 640.1 ng/dL (ref 264.0–916.0)

## 2018-01-30 LAB — TSH+T4F+T3FREE
FREE T4: 1.12 ng/dL (ref 0.82–1.77)
T3, Free: 3.1 pg/mL (ref 2.0–4.4)
TSH: 0.443 u[IU]/mL — ABNORMAL LOW (ref 0.450–4.500)

## 2018-01-30 LAB — HEMOGLOBIN A1C
Est. average glucose Bld gHb Est-mCnc: 108 mg/dL
HEMOGLOBIN A1C: 5.4 % (ref 4.8–5.6)

## 2018-01-30 LAB — THYROID STIMULATING IMMUNOGLOBULIN: Thyroid Stim Immunoglobulin: <0.1 IU/L (ref 0.00–0.55)

## 2018-01-30 LAB — VITAMIN B12: Vitamin B-12: 464 pg/mL (ref 232–1245)

## 2018-01-31 LAB — CATECHOLAMINE+VMA, 24-HR URINE
DOPAMINE 24H UR: 288 ug/(24.h) (ref 0–510)
DOPAMINE RANDOM UR: 115 ug/L
EPINEPHRINE RAND UR: 2 ug/L
Epinephrine, 24H Ur: 5 ug/24 hr (ref 0–20)
NOREPINEPHRINE 24H UR: 33 ug/(24.h) (ref 0–135)
Norepinephrine, Rand Ur: 13 ug/L
VMA, 24H Ur Adult: 2.8 mg/24 hr (ref 0.0–7.5)
VMA, Urine: 1.1 mg/L

## 2018-02-01 DIAGNOSIS — F411 Generalized anxiety disorder: Secondary | ICD-10-CM | POA: Diagnosis not present

## 2018-02-02 ENCOUNTER — Other Ambulatory Visit: Payer: Self-pay

## 2018-02-02 ENCOUNTER — Encounter: Payer: Self-pay | Admitting: Physician Assistant

## 2018-02-02 ENCOUNTER — Ambulatory Visit (INDEPENDENT_AMBULATORY_CARE_PROVIDER_SITE_OTHER): Payer: BLUE CROSS/BLUE SHIELD | Admitting: Physician Assistant

## 2018-02-02 VITALS — BP 122/86 | HR 87 | Temp 97.9°F | Resp 20 | Ht 64.76 in | Wt 170.4 lb

## 2018-02-02 DIAGNOSIS — H60501 Unspecified acute noninfective otitis externa, right ear: Secondary | ICD-10-CM | POA: Diagnosis not present

## 2018-02-02 DIAGNOSIS — H6121 Impacted cerumen, right ear: Secondary | ICD-10-CM

## 2018-02-02 DIAGNOSIS — F411 Generalized anxiety disorder: Secondary | ICD-10-CM | POA: Diagnosis not present

## 2018-02-02 MED ORDER — NEOMYCIN-COLIST-HC-THONZONIUM 3.3-3-10-0.5 MG/ML OT SUSP: 4.0000 [drp] | Freq: Four times a day (QID) | OTIC | 0 refills | Status: DC

## 2018-02-02 MED ORDER — CIPROFLOXACIN-HYDROCORTISONE 0.2-1 % OT SUSP: 3.0000 [drp] | Freq: Two times a day (BID) | OTIC | 0 refills | Status: DC

## 2018-02-02 MED ORDER — NEOMYCIN-POLYMYXIN-HC 3.5-10000-1 OT SOLN: 3.0000 [drp] | Freq: Three times a day (TID) | OTIC | 0 refills | Status: AC

## 2018-02-02 NOTE — Progress Notes (Signed)
MRN: [id number] DOB: 08-Oct-1980  Subjective:   Gary Potts is a 37 y.o. adult presenting for chief complaint of Ear Pain (X 1 day- right ear) .  Has had right ear itching x months. Sometimes has pain. No use of qtips or earbuds. No recent swimming. No recent travel.  Denies fever, rhinorrhea, ear drainage and tinniuts, and decreased hearing, chills, nausea, vomiting and dizziness. Has not had sick contact with anyone. Has history of seasonal allergies. Denies any other aggravating or relieving factors, no other questions or concerns.  Gary Potts has a current medication list which includes the following prescription(s): albuterol, amoxicillin, amphetamine-dextroamphetamine, amphetamine-dextroamphetamine, vitamin c, chlorhexidine, clindamycin, clonazepam, cyclobenzaprine, diclofenac sodium, epinephrine, fluticasone, gabapentin, hydroxyzine, ibuprofen, levocetirizine, montelukast, propranolol, ranitidine, and testosterone cypionate, and the following Facility-Administered Medications: omalizumab. Also is allergic to penicillins; shellfish allergy; and latex.  Gary Potts  has a past medical history of Anxiety, Migraines, and Urticaria. Also  has a past surgical history that includes Oophorectomy (Right, 2005); Cholecystectomy (2014); Total laparoscopic hysterectomy with salpingectomy (Bilateral, 08/30/2017); and Cystourethroscopy (N/A, 08/30/2017).   Objective:   Vitals: BP 122/86   Pulse 87   Temp 97.9 F (36.6 C) (Oral)   Resp 20   Ht 5' 4.76" (1.645 m)   Wt 170 lb 6.4 oz (77.3 kg)   SpO2 98%   BMI 28.56 kg/m   Physical Exam  Constitutional: He is oriented to person, place, and time. He appears well-developed and well-nourished. No distress.  HENT:  Head: Normocephalic and atraumatic.  Right Ear: Hearing normal. There is tenderness (when tragal pressure applied and with manipulation of auricle). No middle ear effusion.  Left Ear: Hearing and external ear normal. No tenderness.  Right  ear canal impacted with copious amount of wet brown cerumen, unable to visualize TM. Mild amount of dark brown cerumen in left ear canal, blocking full view of TM.   Post bilateral ear lavage, ear canals are clear. Right ear canal is mildly erythematous and edematous, mild amount of flaky debri noted, no fungal spores. No TM perforation.   No erythema or bulging of left TM noted.   Eyes: Conjunctivae are normal.  Neck: Normal range of motion.  Pulmonary/Chest: Effort normal.  Lymphadenopathy:       Head (right side): No submental, no submandibular, no tonsillar, no preauricular, no posterior auricular and no occipital adenopathy present.       Head (left side): No submental, no submandibular, no tonsillar, no preauricular, no posterior auricular and no occipital adenopathy present.    He has no cervical adenopathy.       Right: No supraclavicular adenopathy present.       Left: No supraclavicular adenopathy present.  Neurological: He is alert and oriented to person, place, and time.  Skin: Skin is warm and dry.  Psychiatric: He has a normal mood and affect.  Vitals reviewed.   No results found for this or any previous visit (from the past 24 hour(s)).  Assessment and Plan :  1. Acute otitis externa of right ear, unspecified type Hx and PE findings consistent with otitis externa. No TM perforation noted. Avoid swimming. Given educational material on how to properly use ear drops. Advised to return to clinic if symptoms worsen, do not improve, or as needed.  - neomycin-polymyxin-hydrocortisone (CORTISPORIN) OTIC solution; Place 3 drops into the right ear 3 (three) times daily for 10 days.  Dispense: 10 mL; Refill: 0  2. Impacted cerumen of right ear Resolved.  - Ear Lavage  Benjiman CoreBrittany Insiya Oshea, PA-C  [facility] 02/02/2018 11:35 AM

## 2018-02-02 NOTE — Progress Notes (Signed)
This encounter was created in error - please disregard.

## 2018-02-02 NOTE — Patient Instructions (Addendum)
  To use the ear drops: -Lie down or tilt your head with your ear facing upward. Open the ear canal by gently pulling your ear back, or pulling downward on the earlobe when giving this medicine to a child. -Hold the dropper upside down over your ear and drop the correct number of drops into the ear. -Stay lying down or with your head tilted for at least 5 minutes. You may use a small piece of cotton to plug the ear and keep the medicine from draining out. -Do not touch the dropper tip or place it directly in your ear. It may become contaminated. Wipe the tip with a clean tissue but do not wash with water or soap. -Use this medicine for the full prescribed length of time. Your symptoms may improve before the infection is completely cleared. Skipping doses may also increase your risk of further infection that is resistant to antibiotics.   Otitis Externa Otitis externa is an infection of the outer ear canal. The outer ear canal is the area between the outside of the ear and the eardrum. Otitis externa is sometimes called "swimmer's ear." Follow these instructions at home:  If you were given antibiotic ear drops, use them as told by your doctor. Do not stop using them even if your condition gets better.  Take over-the-counter and prescription medicines only as told by your doctor.  Keep all follow-up visits as told by your doctor. This is important. How is this prevented?  Keep your ear dry. Use the corner of a towel to dry your ear after you swim or bathe.  Try not to scratch or put things in your ear. Doing these things makes it easier for germs to grow in your ear.  Avoid swimming in lakes, dirty water, or pools that may not have the right amount of a chemical called chlorine.  Consider making ear drops and putting 3 or 4 drops in each ear after you swim. Ask your doctor about how you can make ear drops. Contact a doctor if:  You have a fever.  After 3 days your ear is still red,  swollen, or painful.  After 3 days you still have pus coming from your ear.  Your redness, swelling, or pain gets worse.  You have a really bad headache.  You have redness, swelling, pain, or tenderness behind your ear. This information is not intended to replace advice given to you by your health care provider. Make sure you discuss any questions you have with your health care provider. Document Released: 11/23/2007 Document Revised: 07/02/2015 Document Reviewed: 03/16/2015 Elsevier Interactive Patient Education  2018 [employer]vier [employer]    IF you received an x-ray today, you will receive an invoice from [facility]. Please contact [facility] at [phone] with questions or concerns regarding your invoice.   IF you received labwork today, you will receive an invoice from [facility]. Please contact [facility] at [phone] with questions or concerns regarding your invoice.   Our billing staff will not be able to assist you with questions regarding bills from these companies.  You will be contacted with the lab results as soon as they are available. The fastest way to get your results is to activate your My Chart account. Instructions are located on the last page of this paperwork. If you have not heard from us regarding the results in 2 weeks, please contact this office.     

## 2018-02-06 DIAGNOSIS — F411 Generalized anxiety disorder: Secondary | ICD-10-CM | POA: Diagnosis not present

## 2018-02-08 ENCOUNTER — Other Ambulatory Visit: Payer: Self-pay

## 2018-02-08 ENCOUNTER — Ambulatory Visit (INDEPENDENT_AMBULATORY_CARE_PROVIDER_SITE_OTHER): Payer: BLUE CROSS/BLUE SHIELD | Admitting: Family Medicine

## 2018-02-08 ENCOUNTER — Encounter: Payer: Self-pay | Admitting: Family Medicine

## 2018-02-08 VITALS — HR 89 | Temp 98.4°F | Resp 16 | Ht 64.0 in | Wt 168.4 lb

## 2018-02-08 DIAGNOSIS — K219 Gastro-esophageal reflux disease without esophagitis: Secondary | ICD-10-CM

## 2018-02-08 DIAGNOSIS — F411 Generalized anxiety disorder: Secondary | ICD-10-CM | POA: Diagnosis not present

## 2018-02-08 DIAGNOSIS — R51 Headache: Secondary | ICD-10-CM | POA: Diagnosis not present

## 2018-02-08 DIAGNOSIS — Z1501 Genetic susceptibility to malignant neoplasm of breast: Secondary | ICD-10-CM | POA: Diagnosis not present

## 2018-02-08 DIAGNOSIS — L501 Idiopathic urticaria: Secondary | ICD-10-CM | POA: Diagnosis not present

## 2018-02-08 DIAGNOSIS — Z1509 Genetic susceptibility to other malignant neoplasm: Secondary | ICD-10-CM

## 2018-02-08 DIAGNOSIS — J324 Chronic pansinusitis: Secondary | ICD-10-CM

## 2018-02-08 DIAGNOSIS — R519 Headache, unspecified: Secondary | ICD-10-CM

## 2018-02-08 DIAGNOSIS — Z803 Family history of malignant neoplasm of breast: Secondary | ICD-10-CM

## 2018-02-08 MED ORDER — PREDNISONE 20 MG PO TABS: ORAL_TABLET | ORAL | 0 refills | Status: DC

## 2018-02-08 MED ORDER — PANTOPRAZOLE SODIUM 40 MG PO TBEC: 40.0000 mg | DELAYED_RELEASE_TABLET | Freq: Every day | ORAL | 1 refills | Status: DC

## 2018-02-08 MED ORDER — CEFDINIR 250 MG/5ML PO SUSR: 600.0000 mg | Freq: Every day | ORAL | 0 refills | Status: DC

## 2018-02-08 NOTE — Progress Notes (Signed)
Subjective:    Patient ID: [facility], adult    DOB: Aug 30, 1980, 37 y.o.   MRN: [id number] Chief Complaint  Patient presents with  . ear    right ear, was doing "so,so" with the drops. itching is back and forth with both ears, throat is sore again. i will throw up at night because of the drainage.    HPI  Case was seen by one of my colleagues 1 wk ago due to 1d of Rt ear pain after months of itching and occ pain.  Impacted cerumen was removed by lavage and was noted to be mildly erythematous and edematous with mild amount of flaking debris and tenderness with manipulation of tragus and external ear so started on cortisporin otic gtts tid x 10d.  Severe heartburn - gets very bloated.   Very irritable. Can't hear anything but very sensitive to loud sounds  Past Medical History:  Diagnosis Date  . Anxiety   . Migraines   . Urticaria    Past Surgical History:  Procedure Laterality Date  . CHOLECYSTECTOMY  2014  . CYSTOURETHROSCOPY N/A 08/30/2017   Dr[facility] Bryn [facility]ulling at [facility] Minimally In[facility] Gynecologic Surgery  . OOPHORECTOMY Right 2005   found to be PARTIAL right oopherectomy only during 08/2017 total hyst w/ BSO  . TOTAL LAPAROSCOPIC HYSTERECTOMY WITH SALPINGECTOMY Bilateral 08/30/2017   Dr. Bryn Gulling at [facility] Minimally In[facility] Gynecologic Surgery; small 1 cm Rt ovarian reminant; no cervical, endometrial, B ovarian, or B fallopian tube pathologic diagnosis inc atypia, dysplasia, or malignancy; no need for continued pap screening   Current Outpatient Medications on File Prior to Visit  Medication Sig Dispense Refill  . albuterol (PROAIR HFA) 108 (90 Base) MCG/ACT inhaler Inhale 2 puffs into the lungs every 4 (four) hours as needed for wheezing or shortness of breath. 1 Inhaler 2  . amphetamine-dextroamphetamine (ADDERALL) 20 MG tablet Take 1 tablet (20 mg total) by mouth 2 (two) times daily. 60 tablet 0  . Ascorbic Acid (VITAMIN C) 500 MG CHEW Chew 500 mg by mouth daily.      . chlorhexidine (PERIDEX) 0.12 % solution Use as directed 15 mLs in the mouth or throat 2 (two) times daily. Swish, gargle, and spit out as needed to treat/prevent gum abscess 500 mL 1  . clindamycin (CLEOCIN T) 1 % lotion Apply topically 2 (two) times daily. 60 mL 2  . clonazePAM (KLONOPIN) 0.5 MG tablet Take 1 tablet (0.5 mg total) by mouth 2 (two) times daily as needed for anxiety. 60 tablet 1  . cyclobenzaprine (FLEXERIL) 5 MG tablet   3  . diclofenac sodium (VOLTAREN) 1 % GEL Apply 2 g topically 2 (two) times daily as needed. 100 g 5  . EPINEPHrine (EPIPEN 2-PAK) 0.3 mg/0.3 mL IJ SOAJ injection Use as directed for severe allergic reaction. 2 Device 1  . fluticasone (FLONASE) 50 MCG/ACT nasal spray Place 2 sprays into both nostrils daily as needed (for stuffy nose). 16 g 5  . Gabapentin 300 MG/6ML SOLN Take 6 mLs by mouth at bedtime. 180 mL 0  . hydrOXYzine (ATARAX/VISTARIL) 25 MG tablet Take 1 tablet (25 mg total) by mouth at bedtime. 30 tablet 5  . ibuprofen (ADVIL,MOTRIN) 600 MG tablet   1  . levocetirizine (XYZAL) 5 MG tablet Take 1 tablet (5 mg total) by mouth every evening. 30 tablet 5  . montelukast (SINGULAIR) 10 MG tablet One tablet by mouth once a day for coughing or wheezing. 34 tablet 5  . neomycin-polymyxin-hydrocortisone (  CORTISPORIN) OTIC solution Place 3 drops into the right ear 3 (three) times daily for 10 days. 10 mL 0  . propranolol (INDERAL) 40 MG tablet Take 1 tablet (40 mg total) by mouth every 4 (four) hours as needed (palpitations, tremor, anxiety). 60 tablet 0  . ranitidine (ZANTAC) 150 MG tablet TAKE 1 TABLET BY MOUTH TWICE DAILY FOR ITCHING - IT MAY HELP HEARTBURN 60 tablet 5  . testosterone cypionate (DEPOTESTOSTERONE CYPIONATE) 200 MG/ML injection Inject 0.3 mLs (60 mg total) into the muscle once a week. 2 mL 1   Current Facility-Administered Medications on File Prior to Visit  Medication Dose Route Frequency Provider Last Rate Last Dose  . omalizumab [name])  injection 300 mg  300 mg Subcutaneous Q28 days Stephannie Li A, MD   300 mg at 01/17/18 1143   Allergies  Allergen Reactions  . Penicillins Anaphylaxis and Rash    Pt can tolerate amoxicillin  Has patient had a PCN reaction causing immediate rash, facial/tongue/throat swelling, SOB or lightheadedness with hypotension: no  Has patient had a PCN reaction causing severe rash involving mucus membranes or skin necrosis: no Has patient had a PCN reaction that required hospitalization: unknown Has patient had a PCN reaction occurring within the last 10 years: no If all of the above answers are "NO", then may proceed with Cephalosporin use.   . Shellfish Allergy Anaphylaxis  . Latex Hives and Rash   Family History  Problem Relation Age of Onset  . Allergic rhinitis Mother   . Sinusitis Mother   . Sinusitis Sister   . Urticaria Sister   . Breast cancer Sister   . Ovarian cancer Maternal Grandmother   . Breast cancer Maternal Grandmother   . Angioedema Neg Hx   . Asthma Neg Hx   . Eczema Neg Hx   . Immunodeficiency Neg Hx    Social History   Socioeconomic History  . Marital status: Married    Spouse name: Educational psychologist  . Number of children: 0  . Years of education: Not on file  . Highest education level: Bachelor's degree (e.g., BA, AB, BS)  Occupational History  . Occupation: Electrical engineer  . Occupation: Consulting civil engineer    Comment: possible law school starting summer or fall 2019  Social Needs  . Financial resource strain: Not on file  . Food insecurity:    Worry: Not on file    Inability: Not on file  . Transportation needs:    Medical: Not on file    Non-medical: Not on file  Tobacco Use  . Smoking status: Never Smoker  . Smokeless tobacco: Never Used  Substance and Sexual Activity  . Alcohol use: Not Currently    Comment: rare- stop 06/20/2017  . Drug use: No  . 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 Physical activity:    Days per week: Not on file    Minutes per session: Not on file  . Stress: Not on file  Relationships  . Social connections:    Talks on phone: Not on file    Gets together: Not on file    Attends religious service: Not on file    Active member of club or organization: Not on file    Attends meetings of clubs or organizations: Not on file    Relationship status: Not  on file  Other Topics Concern  . Not on file  Social History Narrative   Given name - "Victorino Dike [place] - changed 06/2016.   Lives with wife [name].   Works Office manager.   Depression screen [facility] 2/9 02/08/2018 02/02/2018 01/25/2018 01/22/2018 12/01/2017  Decreased Interest 0 0 0 0 0  Down, Depressed, Hopeless 0 0 0 0 0  PHQ - 2 Score 0 0 0 0 0  Altered sleeping - - - 2 -  Tired, decreased energy - - - 0 -  Change in appetite - - - 0 -  Feeling bad or failure about yourself  - - - 0 -  Trouble concentrating - - - 1 -  Moving slowly or fidgety/restless - - - 2 -  Suicidal thoughts - - - 0 -  PHQ-9 Score - - - 5 -  Difficult doing work/chores - - - Somewhat difficult -   Review of Systems See hpi    Objective:   Physical Exam  Constitutional: He is oriented to person, place, and time. He appears well-developed and well-nourished. He appears lethargic. He appears ill. No distress.  HENT:  Head: Normocephalic and atraumatic.  Right Ear: External ear and ear canal normal. Tympanic membrane is retracted. A middle ear effusion is present.  Left Ear: External ear and ear canal normal. Tympanic membrane is retracted. A middle ear effusion is present.  Nose: Mucosal edema and rhinorrhea present. Right sinus exhibits maxillary sinus tenderness. Left sinus exhibits maxillary sinus tenderness.  Mouth/Throat: Uvula is midline and mucous membranes are normal. Posterior oropharyngeal erythema present.  No oropharyngeal exudate, posterior oropharyngeal edema or tonsillar abscesses.  Eyes: Conjunctivae are normal. Right eye exhibits no discharge. Left eye exhibits no discharge. No scleral icterus.  Neck: Normal range of motion. Neck supple.  Cardiovascular: Normal rate, regular rhythm, normal heart sounds and intact distal pulses.  Pulmonary/Chest: Effort normal and breath sounds normal.  Lymphadenopathy:       Head (right side): Submandibular adenopathy present. No preauricular and no posterior auricular adenopathy present.       Head (left side): Submandibular adenopathy present. No preauricular and no posterior auricular adenopathy present.    He has no cervical adenopathy.       Right: No supraclavicular adenopathy present.       Left: No supraclavicular adenopathy present.  Neurological: He is oriented to person, place, and time. He appears lethargic.  Skin: Skin is warm and dry. He is not diaphoretic. No erythema.  Psychiatric: He has a normal mood and affect. His behavior is normal.   Pulse 89   Temp 98.4 F (36.9 C) (Oral)   Resp 16   Ht 5\' 4"  (1.626 m)   Wt 168 lb 6.4 oz (76.4 kg)   SpO2 96%   BMI 28.91 kg/m      Assessment & Plan:   1. Chronic pansinusitis     Meds ordered this encounter  Medications  . cefdinir (OMNICEF) 250 MG/5ML suspension    Sig: Take 12 mLs (600 mg total) by mouth daily.    Dispense:  120 mL    Refill:  0  . predniSONE (DELTASONE) 20 MG tablet    Sig: Take 4 tabs qd x 1d, then 3 tabs qd x 2d then 2 tab qd x 3d then 1 tabs x 3d, then 1/2 tab x 2d, then off.    Dispense:  21 tablet    Refill:  0     Norberto Sorenson, M.D.  [facility]  [place] [address] [place], [place] [address] [phone] phone [phone] fax  02/08/18 11:09 AM

## 2018-02-08 NOTE — Patient Instructions (Addendum)
You need to schedule your mammogram and your oncology consult at [facility] . Call [phone] to schedule.     If you have lab work done today you will be contacted with your lab results within the next 2 weeks.  If you have not heard from [place] then please contact us. The fastest way to get your results is to register for My Chart.   IF you received an x-ray today, you will receive an invoice from [facility]. Please contact [facility] at [phone] with questions or concerns regarding your invoice.   IF you received labwork today, you will receive an invoice from [facility]. Please contact [facility] at [phone] with questions or concerns regarding your invoice.   Our billing staff will not be able to assist you with questions regarding bills from these companies.  You will be contacted with the lab results as soon as they are available. The fastest way to get your results is to activate your My Chart account. Instructions are located on the last page of this paperwork. If you have not heard from [place] regarding the results in 2 weeks, please contact this office.     Sinusitis, Adult Sinusitis is soreness and inflammation of your sinuses. Sinuses are hollow spaces in the bones around your face. Your sinuses are located:  Around your eyes.  In the middle of your forehead.  Behind your nose.  In your cheekbones.  Your sinuses and nasal passages are lined with a stringy fluid (mucus). Mucus normally drains out of your sinuses. When your nasal tissues become inflamed or swollen, the mucus can become trapped or blocked so air cannot flow through your sinuses. This allows bacteria, viruses, and funguses to grow, which leads to infection. Sinusitis can develop quickly and last for 7?10 days (acute) or for more than 12 weeks (chronic). Sinusitis often develops after a cold. What are the causes? This condition is caused by anything that creates swelling in the sinuses  or stops mucus from draining, including:  Allergies.  Asthma.  Bacterial or viral infection.  Abnormally shaped bones between the nasal passages.  Nasal growths that contain mucus (nasal polyps).  Narrow sinus openings.  Pollutants, such as chemicals or irritants in the air.  A foreign object stuck in the nose.  A fungal infection. This is rare.  What increases the risk? The following factors may make you more likely to develop this condition:  Having allergies or asthma.  Having had a recent cold or respiratory tract infection.  Having structural deformities or blockages in your nose or sinuses.  Having a weak immune system.  Doing a lot of swimming or diving.  Overusing nasal sprays.  Smoking.  What are the signs or symptoms? The main symptoms of this condition are pain and a feeling of pressure around the affected sinuses. Other symptoms include:  Upper toothache.  Earache.  Headache.  Bad breath.  Decreased sense of smell and taste.  A cough that may get worse at night.  Fatigue.  Fever.  Thick drainage from your nose. The drainage is often green and it may contain pus (purulent).  Stuffy nose or congestion.  Postnasal drip. This is when extra mucus collects in the throat or back of the nose.  Swelling and warmth over the affected sinuses.  Sore throat.  Sensitivity to light.  How is this diagnosed? This condition is diagnosed based on symptoms, a medical history, and a physical exam. To find out if your condition is acute  or chronic, your health care provider may:  Look in your nose for signs of nasal polyps.  Tap over the affected sinus to check for signs of infection.  View the inside of your sinuses using an imaging device that has a light attached (endoscope).  If your health care provider suspects that you have chronic sinusitis, you may also:  Be tested for allergies.  Have a sample of mucus taken from your nose (nasal  culture) and checked for bacteria.  Have a mucus sample examined to see if your sinusitis is related to an allergy.  If your sinusitis does not respond to treatment and it lasts longer than 8 weeks, you may have an MRI or CT scan to check your sinuses. These scans also help to determine how severe your infection is. In rare cases, a bone biopsy may be done to rule out more serious types of fungal sinus disease. How is this treated? Treatment for sinusitis depends on the cause and whether your condition is chronic or acute. If a virus is causing your sinusitis, your symptoms will go away on their own within 10 days. You may be given medicines to relieve your symptoms, including:  Topical nasal decongestants. They shrink swollen nasal passages and let mucus drain from your sinuses.  Antihistamines. These drugs block inflammation that is triggered by allergies. This can help to ease swelling in your nose and sinuses.  Topical nasal corticosteroids. These are nasal sprays that ease inflammation and swelling in your nose and sinuses.  Nasal saline washes. These rinses can help to get rid of thick mucus in your nose.  If your condition is caused by bacteria, you will be given an antibiotic medicine. If your condition is caused by a fungus, you will be given an antifungal medicine. Surgery may be needed to correct underlying conditions, such as narrow nasal passages. Surgery may also be needed to remove polyps. Follow these instructions at home: Medicines  Take, use, or apply over-the-counter and prescription medicines only as told by your health care provider. These may include nasal sprays.  If you were prescribed an antibiotic medicine, take it as told by your health care provider. Do not stop taking the antibiotic even if you start to feel better. Hydrate and Humidify  Drink enough water to keep your urine clear or pale yellow. Staying hydrated will help to thin your mucus.  Use a cool mist  humidifier to keep the humidity level in your home above 50%.  Inhale steam for 10-15 minutes, 3-4 times a day or as told by your health care provider. You can do this in the bathroom while a hot shower is running.  Limit your exposure to cool or dry air. Rest  Rest as much as possible.  Sleep with your head raised (elevated).  Make sure to get enough sleep each night. General instructions  Apply a warm, moist washcloth to your face 3-4 times a day or as told by your health care provider. This will help with discomfort.  Wash your hands often with soap and water to reduce your exposure to viruses and other germs. If soap and water are not available, use hand sanitizer.  Do not smoke. Avoid being around people who are smoking (secondhand smoke).  Keep all follow-up visits as told by your health care provider. This is important. Contact a health care provider if:  You have a fever.  Your symptoms get worse.  Your symptoms do not improve within 10 days. Get help  right away if:  You have a severe headache.  You have persistent vomiting.  You have pain or swelling around your face or eyes.  You have vision problems.  You develop confusion.  Your neck is stiff.  You have trouble breathing. This information is not intended to replace advice given to you by your health care provider. Make sure you discuss any questions you have with your health care provider. Document Released: 06/06/2005 Document Revised: 01/31/2016 Document Reviewed: 04/01/2015 Elsevier Interactive Patient Education  [employer].

## 2018-02-09 ENCOUNTER — Ambulatory Visit (INDEPENDENT_AMBULATORY_CARE_PROVIDER_SITE_OTHER): Payer: BLUE CROSS/BLUE SHIELD | Admitting: Family Medicine

## 2018-02-09 VITALS — BP 112/86 | Ht 64.0 in | Wt 168.0 lb

## 2018-02-09 DIAGNOSIS — M7501 Adhesive capsulitis of right shoulder: Secondary | ICD-10-CM

## 2018-02-09 DIAGNOSIS — M25511 Pain in right shoulder: Secondary | ICD-10-CM | POA: Diagnosis not present

## 2018-02-09 MED ORDER — METHYLPREDNISOLONE ACETATE 40 MG/ML IJ SUSP
40.0000 mg | Freq: Once | INTRAMUSCULAR | Status: AC
  Administered 2018-02-09: 40 mg via INTRA_ARTICULAR

## 2018-02-09 NOTE — Assessment & Plan Note (Signed)
  Large volume injection into right shoulder done w/ US today, patient tolerated this well. Will refer to PT for further exercises, continue home exercises, follow up 4-5 weeks for recheck. Discussed that he may need more injections to fully resolve issue. The patient indicates understanding of these issues and agrees with the plan.

## 2018-02-09 NOTE — Progress Notes (Signed)
   [place] - 37 y.o. adult MRN [id number]  Date of birth: Jun 07, 1981  SUBJECTIVE:  Including CC & ROS.  CC: right shoulder pain  Patient had 2nd injection into right shoulder 8/2 and reports significant improvement for the next 1-2 weeks where he felt his movement was back to normal and pain was minimal. He then reports he woke up one morning and the shoulder was tight and stuck again. He has been doing home exercises. He reports strain from shoulder limitations affecting his neck and chest muscles. Denies numbness/tingling in arm. Denies arm weakness.    HISTORY: Past Medical, Surgical, Social, and Family History Reviewed & Updated per EMR.   Pertinent Historical Findings include: PMH- anxiety PSxHx- gallbladder removal  DATA REVIEWED: Prior US guided injections of R shoulder from 8/2, 7/12  PHYSICAL EXAM:  VS: BP:112/86  HR: bpm  TEMP: ( )  RESP:   HT:5\' 4"  (162.6 cm)   WT:168 lb (76.2 kg)  BMI:28.82 PHYSICAL EXAM: Gen: pleasant AA male in NAD Heart: regular rate Lungs: normal work of breathing MSK: right shoulder significantly limited in AROM flexion compared to left, extension. PROM slightly better range. Tender to palpation of scalenes, pectoralis muscles. Neurovascularly in tact distally.  Extremities: no edema or cyanosis  Neuro: no focal deficits  Shoulder Injection Procedure Note  PROCEDURE: INJECTION: Patient was given informed consent, signed copy in the chart. Appropriate time out was taken. Area prepped and draped in usual sterile fashion. Ethyl chloride was  used for local anesthesia. A 21 Roniel 1 1/2 inch needle was used.. 1 cc of methylprednisolone 40 mg/ml plus 9 cc of 1% lidocaine without epinephrine was injected into the right glenohumeral joint using a(n) ultrasound-guided posterior approach.   The patient tolerated the procedure well. There were no complications. Post procedure instructions were given.   ASSESSMENT & PLAN: See problem based charting &  AVS for pt instructions.  Adhesive capsulitis of right shoulder  Large volume injection into right shoulder done w/ US today, patient tolerated this well. Will refer to PT for further exercises, continue home exercises, follow up 4-5 weeks for recheck. Discussed that he may need more injections to fully resolve issue. The patient indicates understanding of these issues and agrees with the plan.

## 2018-02-09 NOTE — Patient Instructions (Signed)
  Good to see you today! We referred you to PT, please check to see what the cost would be for you. Continue home exercises Follow up in 4-5 weeks.  Dolores PattyAngela Delon Revelo, DO PGY-3,  Family Medicine 02/09/2018 9:29 AM

## 2018-02-09 NOTE — Progress Notes (Signed)
[facility] Attending Note: I have seen and examined this patient. I have discussed this patient with the resident and reviewed the assessment and plan as documented above. I agree with the resident's findings and plan.  I was present and assisted with the procedure of glenohumeral joint injection under ultrasound guidance with Dr. Wonda Oldsiccio  I am pleased with the progress so far.  This will be the second glenohumeral joint injection.  Follow-up 4 to 6 weeks.  Continue to push the home exercise program and range of motion.  Sounds like he is getting some improvement after the injection last time.  Hopefully will gain more with each serial injections.

## 2018-02-12 ENCOUNTER — Ambulatory Visit: Payer: Self-pay

## 2018-02-13 ENCOUNTER — Telehealth: Payer: Self-pay | Admitting: Family Medicine

## 2018-02-13 DIAGNOSIS — F641 Gender identity disorder in adolescence and adulthood: Secondary | ICD-10-CM | POA: Diagnosis not present

## 2018-02-13 NOTE — Telephone Encounter (Signed)
Copied from CRM [phone]. Topic: Quick Communication - See Telephone Encounter >> Feb 13, 2018  9:22 AM Floria RavelingStovall, Shana A wrote: CRM for notification. See Telephone encounter for: 02/13/18. Pt called in and stated that he went to see plastic surgeon they want pt to get a 2nd option before having surgery. Pt stated Dr Clelia CroftShaw would know what this surgery was about and the situation.  Would not give me any other info.  Pt as an appt Saturday.  Pt would like another referral put in to a different Surgeon.  Pt has the [employer] number [phone]

## 2018-02-14 ENCOUNTER — Ambulatory Visit (INDEPENDENT_AMBULATORY_CARE_PROVIDER_SITE_OTHER): Payer: BLUE CROSS/BLUE SHIELD | Admitting: *Deleted

## 2018-02-14 DIAGNOSIS — R1310 Dysphagia, unspecified: Secondary | ICD-10-CM | POA: Diagnosis not present

## 2018-02-14 DIAGNOSIS — L500 Allergic urticaria: Secondary | ICD-10-CM | POA: Diagnosis not present

## 2018-02-14 DIAGNOSIS — F640 Transsexualism: Secondary | ICD-10-CM | POA: Diagnosis not present

## 2018-02-14 DIAGNOSIS — K219 Gastro-esophageal reflux disease without esophagitis: Secondary | ICD-10-CM | POA: Diagnosis not present

## 2018-02-14 DIAGNOSIS — R198 Other specified symptoms and signs involving the digestive system and abdomen: Secondary | ICD-10-CM | POA: Diagnosis not present

## 2018-02-15 ENCOUNTER — Ambulatory Visit: Payer: BLUE CROSS/BLUE SHIELD | Admitting: Physical Therapy

## 2018-02-15 DIAGNOSIS — F411 Generalized anxiety disorder: Secondary | ICD-10-CM | POA: Diagnosis not present

## 2018-02-17 ENCOUNTER — Ambulatory Visit (INDEPENDENT_AMBULATORY_CARE_PROVIDER_SITE_OTHER): Payer: BLUE CROSS/BLUE SHIELD | Admitting: Family Medicine

## 2018-02-17 ENCOUNTER — Encounter: Payer: Self-pay | Admitting: Family Medicine

## 2018-02-17 ENCOUNTER — Other Ambulatory Visit: Payer: Self-pay

## 2018-02-17 VITALS — BP 123/81 | HR 86 | Temp 98.8°F | Resp 16 | Wt 169.4 lb

## 2018-02-17 DIAGNOSIS — R590 Localized enlarged lymph nodes: Secondary | ICD-10-CM | POA: Diagnosis not present

## 2018-02-17 DIAGNOSIS — J4520 Mild intermittent asthma, uncomplicated: Secondary | ICD-10-CM

## 2018-02-17 DIAGNOSIS — J029 Acute pharyngitis, unspecified: Secondary | ICD-10-CM | POA: Diagnosis not present

## 2018-02-17 DIAGNOSIS — F411 Generalized anxiety disorder: Secondary | ICD-10-CM | POA: Diagnosis not present

## 2018-02-17 LAB — POCT CBC
GRANULOCYTE PERCENT: 77.8 % (ref 37–80)
HCT, POC: 49.8 % (ref 43.5–53.7)
HEMOGLOBIN: 15.2 g/dL (ref 14.1–18.1)
Lymph, poc: 2.5 (ref 0.6–3.4)
MCH, POC: 24.5 pg — AB (ref 27–31.2)
MCHC: 30.6 g/dL — AB (ref 31.8–35.4)
MCV: 80.1 fL (ref 80–97)
MID (cbc): 0.3 (ref 0–0.9)
MPV: 7.6 fL (ref 0–99.8)
PLATELET COUNT, POC: 331 10*3/uL (ref 142–424)
POC Granulocyte: 10 — AB (ref 2–6.9)
POC LYMPH PERCENT: 19.6 %L (ref 10–50)
POC MID %: 2.6 % (ref 0–12)
RBC: 6.22 M/uL — AB (ref 4.69–6.13)
RDW, POC: 15.5 %
WBC: 12.9 10*3/uL — AB (ref 4.6–10.2)

## 2018-02-17 LAB — POC INFLUENZA A&B (BINAX/QUICKVUE)
Influenza A, POC: NEGATIVE
Influenza B, POC: NEGATIVE

## 2018-02-17 LAB — POCT SEDIMENTATION RATE: POCT SED RATE: 5 mm/h (ref 0–22)

## 2018-02-17 LAB — POCT RAPID STREP A (OFFICE): Rapid Strep A Screen: NEGATIVE

## 2018-02-17 MED ORDER — METHYLPREDNISOLONE ACETATE 80 MG/ML IJ SUSP
120.0000 mg | Freq: Once | INTRAMUSCULAR | Status: AC
  Administered 2018-02-17: 120 mg via INTRAMUSCULAR

## 2018-02-17 MED ORDER — GABAPENTIN 300 MG/6ML PO SOLN: 6.0000 mL | Freq: Every day | ORAL | 2 refills | Status: DC

## 2018-02-17 MED ORDER — CLARITHROMYCIN 125 MG/5ML PO SUSR: 250.0000 mg | Freq: Two times a day (BID) | ORAL | 0 refills | Status: DC

## 2018-02-17 NOTE — Progress Notes (Signed)
Gary Potts

## 2018-02-17 NOTE — Patient Instructions (Signed)
° ° ° °  If you have lab work done today you will be contacted with your lab results within the next 2 weeks.  If you have not heard from us then please contact us. The fastest way to get your results is to register for My Chart. ° ° °IF you received an x-ray today, you will receive an invoice from [facility]. Please contact [facility] at [phone] with questions or concerns regarding your invoice.  ° °IF you received labwork today, you will receive an invoice from [facility]. Please contact [facility] at [phone] with questions or concerns regarding your invoice.  ° °Our billing staff will not be able to assist you with questions regarding bills from these companies. ° °You will be contacted with the lab results as soon as they are available. The fastest way to get your results is to activate your My Chart account. Instructions are located on the last page of this paperwork. If you have not heard from us regarding the results in 2 weeks, please contact this office. °  ° ° ° °

## 2018-02-17 NOTE — Progress Notes (Signed)
Subjective:    Patient ID: [facility], adult    DOB: [birth year], 37 y.o.   MRN: [id number] Chief Complaint  Patient presents with  . Ear Pain    FOLLOW UP    HPI Had BRCA2 of uncertain significance.  Had BRCA labs drawn at [facility] did have him a geneticist who rec that he be checked every yr and told him that he did not have a high chance of gettting it.  Sister had breast cancer at 41 - she did NOT have the BRCA gene - was not genetic ER positive.   Past Medical History:  Diagnosis Date  . Anxiety   . Migraines   . Urticaria    Past Surgical History:  Procedure Laterality Date  . CHOLECYSTECTOMY  2014  . CYSTOURETHROSCOPY N/A 08/30/2017   Dr. Paul Dykes at [facility] Minimally Invasive Gynecologic Surgery  . OOPHORECTOMY Right 2005   found to be PARTIAL right oopherectomy only during 08/2017 total hyst w/ BSO  . TOTAL LAPAROSCOPIC HYSTERECTOMY WITH SALPINGECTOMY Bilateral 08/30/2017   Dr. Paul Dykes at [facility] Minimally Invasive Gynecologic Surgery; small 1 cm Rt ovarian reminant; no cervical, endometrial, B ovarian, or B fallopian tube pathologic diagnosis inc atypia, dysplasia, or malignancy; no need for continued pap screening   Current Outpatient Medications on File Prior to Visit  Medication Sig Dispense Refill  . albuterol (PROAIR HFA) 108 (90 Base) MCG/ACT inhaler Inhale 2 puffs into the lungs every 4 (four) hours as needed for wheezing or shortness of breath. 1 Inhaler 2  . Ascorbic Acid (VITAMIN C) 500 MG CHEW Chew 500 mg by mouth daily.    . chlorhexidine (PERIDEX) 0.12 % solution Use as directed 15 mLs in the mouth or throat 2 (two) times daily. Swish, gargle, and spit out as needed to treat/prevent gum abscess 500 mL 1  . clindamycin (CLEOCIN T) 1 % lotion Apply topically 2 (two) times daily. 60 mL 2  . clonazePAM (KLONOPIN) 0.5 MG tablet Take 1 tablet (0.5 mg total) by mouth 2 (two) times daily as needed for anxiety. 60 tablet 1  . cyclobenzaprine  (FLEXERIL) 5 MG tablet   3  . diclofenac sodium (VOLTAREN) 1 % GEL Apply 2 g topically 2 (two) times daily as needed. 100 g 5  . EPINEPHrine (EPIPEN 2-PAK) 0.3 mg/0.3 mL IJ SOAJ injection Use as directed for severe allergic reaction. 2 Device 1  . fluticasone (FLONASE) 50 MCG/ACT nasal spray Place 2 sprays into both nostrils daily as needed (for stuffy nose). 16 g 5  . hydrOXYzine (ATARAX/VISTARIL) 25 MG tablet Take 1 tablet (25 mg total) by mouth at bedtime. 30 tablet 5  . ibuprofen (ADVIL,MOTRIN) 600 MG tablet   1  . levocetirizine (XYZAL) 5 MG tablet Take 1 tablet (5 mg total) by mouth every evening. 30 tablet 5  . montelukast (SINGULAIR) 10 MG tablet One tablet by mouth once a day for coughing or wheezing. 34 tablet 5  . pantoprazole (PROTONIX) 40 MG tablet Take 1 tablet (40 mg total) by mouth daily. 30 minutes before a meal 30 tablet 1  . propranolol (INDERAL) 40 MG tablet Take 1 tablet (40 mg total) by mouth every 4 (four) hours as needed (palpitations, tremor, anxiety). 60 tablet 0  . testosterone cypionate (DEPOTESTOSTERONE CYPIONATE) 200 MG/ML injection Inject 0.3 mLs (60 mg total) into the muscle once a week. 2 mL 1  . clarithromycin (BIAXIN) 125 MG/5ML suspension Take 10 mLs (250 mg total) by mouth  2 (two) times daily. 200 mL 0   Current Facility-Administered Medications on File Prior to Visit  Medication Dose Route Frequency Provider Last Rate Last Dose  . omalizumab Arvid Right) injection 300 mg  300 mg Subcutaneous Q28 days Prince Solian A, MD   300 mg at 02/14/18 1159   Allergies  Allergen Reactions  . Penicillins Anaphylaxis and Rash    Pt can tolerate amoxicillin  Has patient had a PCN reaction causing immediate rash, facial/tongue/throat swelling, SOB or lightheadedness with hypotension: no  Has patient had a PCN reaction causing severe rash involving mucus membranes or skin necrosis: no Has patient had a PCN reaction that required hospitalization: unknown Has patient had a PCN  reaction occurring within the last 10 years: no If all of the above answers are "NO", then may proceed with Cephalosporin use.   . Shellfish Allergy Anaphylaxis  . Latex Hives and Rash   Family History  Problem Relation Age of Onset  . Allergic rhinitis Mother   . Sinusitis Mother   . Breast cancer Mother   . Sinusitis Sister   . Urticaria Sister   . Breast cancer Sister   . Ovarian cancer Maternal Grandmother   . Breast cancer Maternal Grandmother   . Angioedema Neg Hx   . Asthma Neg Hx   . Eczema Neg Hx   . Immunodeficiency Neg Hx    Social History   Socioeconomic History  . Marital status: Married    Spouse name: Psychiatrist  . Number of children: 0  . Years of education: Not on file  . Highest education level: Bachelor's degree (e.g., BA, AB, BS)  Occupational History  . Occupation: Presenter, broadcasting  . Occupation: Ship broker    Comment: possible law school starting summer or fall 2019  Social Needs  . Financial resource strain: Not on file  . Food insecurity:    Worry: Not on file    Inability: Not on file  . Transportation needs:    Medical: Not on file    Non-medical: Not on file  Tobacco Use  . Smoking status: Never Smoker  . Smokeless tobacco: Never Used  Substance and Sexual Activity  . Alcohol use: Not Currently    Comment: rare- stop 06/20/2017  . Drug use: No  . Sexual activity: Yes    Partners: Female    Birth control/protection: Other-see comments, Surgical    Comment: pt is trans-male sexually involved with cis-females so no sperm involved in encounters  Lifestyle  . Physical activity:    Days per week: Not on file    Minutes per session: Not on file  . Stress: Not on file  Relationships  . Social connections:    Talks on phone: Not on file    Gets together: Not on file    Attends religious service: Not on file    Active member of club or organization: Not on file    Attends meetings of clubs or organizations: Not on file    Relationship  status: Not on file  Other Topics Concern  . Not on file  Social History Narrative   Given name - "[place] - changed 06/2016.   Lives with wife [name].   Works [place].   Depression screen [facility] 2/9 03/21/2018 02/17/2018 02/08/2018 02/02/2018 01/25/2018  Decreased Interest 0 0 0 0 0  Down, Depressed, Hopeless 0 - 0 0 0  PHQ - 2 Score 0 0 0 0 0  Altered sleeping - - - - -  Tired, decreased energy - - - - -  Change in appetite - - - - -  Feeling bad or failure about yourself  - - - - -  Trouble concentrating - - - - -  Moving slowly or fidgety/restless - - - - -  Suicidal thoughts - - - - -  PHQ-9 Score - - - - -  Difficult doing work/chores - - - - -     Review of Systems See hpi    Objective:   Physical Exam  Constitutional: He is oriented to person, place, and time. He appears well-developed and well-nourished. No distress.  HENT:  Head: Normocephalic and atraumatic.  Right Ear: Tympanic membrane, external ear and ear canal normal.  Left Ear: Tympanic membrane, external ear and ear canal normal.  Nose: Nose normal. No mucosal edema or rhinorrhea.  Mouth/Throat: Uvula is midline and mucous membranes are normal. Mucous membranes are not pale and not dry. No trismus in the jaw. No uvula swelling. Posterior oropharyngeal edema and posterior oropharyngeal erythema present. No oropharyngeal exudate or tonsillar abscesses.  Eyes: Conjunctivae are normal. Right eye exhibits no discharge. Left eye exhibits no discharge. No scleral icterus.  Neck: Normal range of motion. Neck supple.  Cardiovascular: Normal rate, regular rhythm, normal heart sounds and intact distal pulses.  Pulmonary/Chest: Effort normal and breath sounds normal. No respiratory distress.  Lymphadenopathy:       Head (right side): Submandibular and tonsillar adenopathy present. No preauricular, no posterior auricular and no occipital adenopathy present.       Head (left side): Submandibular and tonsillar  adenopathy present. No preauricular, no posterior auricular and no occipital adenopathy present.    He has cervical adenopathy.       Right cervical: Superficial cervical adenopathy present. No posterior cervical adenopathy present.      Left cervical: No posterior cervical adenopathy present.       Right: No supraclavicular adenopathy present.       Left: No supraclavicular adenopathy present.  Neurological: He is alert and oriented to person, place, and time.  Skin: Skin is warm and dry. He is not diaphoretic. No erythema.  Psychiatric: He has a normal mood and affect. His behavior is normal.   BP 123/81   Pulse 86   Temp 98.8 F (37.1 C) (Oral)   Resp 16   Wt 169 lb 6.4 oz (76.8 kg)   SpO2 96%   BMI 29.08 kg/m     Assessment & Plan:   1. Acute pharyngitis, unspecified etiology   2. Lymphadenopathy, cervical   3. Generalized anxiety disorder   4. Mild intermittent reactive airway disease without complication     Orders Placed This Encounter  Procedures  . Culture, Group A Strep    Order Specific Question:   Source    Answer:   oropharynx  . Epstein-Barr virus VCA antibody panel  . POCT SEDIMENTATION RATE  . POCT CBC  . POCT rapid strep A  . POC Influenza A&B(BINAX/QUICKVUE)    Meds ordered this encounter  Medications  . methylPREDNISolone acetate (DEPO-MEDROL) injection 120 mg  . Gabapentin 300 MG/6ML SOLN    Sig: Take 6-12 mLs by mouth at bedtime.    Dispense:  360 mL    Refill:  2    Delman Cheadle, MD, MPH [facility] at [place] [address] [place], [place]  [address] [phone] Office phone  [phone] Office fax   04/08/18 10:23 PM

## 2018-02-18 LAB — EPSTEIN-BARR VIRUS VCA ANTIBODY PANEL
EBV Early Antigen Ab, IgG: <9 U/mL (ref 0.0–8.9)
EBV VCA IgG: <18 U/mL (ref 0.0–17.9)
EBV VCA IgM: <36 U/mL (ref 0.0–35.9)

## 2018-02-19 LAB — CULTURE, GROUP A STREP

## 2018-02-20 ENCOUNTER — Telehealth: Payer: Self-pay | Admitting: Family Medicine

## 2018-02-20 NOTE — Telephone Encounter (Signed)
Copied from CRM [id number]. Topic: Quick Communication - See Telephone Encounter >> Feb 20, 2018  8:57 AM Mare Loan F wrote: Pt saw Dr. Clelia Croft on Saturday 02/17/18 and she forgot to calll in pt adderall and a nasal spray   [employer]   Best number [phone]

## 2018-02-21 ENCOUNTER — Encounter: Payer: Self-pay | Admitting: Hematology and Oncology

## 2018-02-21 ENCOUNTER — Telehealth: Payer: Self-pay | Admitting: Hematology and Oncology

## 2018-02-21 ENCOUNTER — Other Ambulatory Visit: Payer: Self-pay | Admitting: Family Medicine

## 2018-02-21 NOTE — Telephone Encounter (Signed)
Pt called to check status of medication; contact pt to advise

## 2018-02-21 NOTE — Telephone Encounter (Signed)
Pt returned my call to schedule an appt in the high risk breast clinic. Pt has been scheduled to see Dr. Lindi Adie on 9/30 at 1pm for brca mutation pos. Pt aware to arrive 30 minutes early. Letter mailed.

## 2018-02-22 ENCOUNTER — Other Ambulatory Visit: Payer: Self-pay | Admitting: Family Medicine

## 2018-02-22 ENCOUNTER — Ambulatory Visit
Admission: RE | Admit: 2018-02-22 | Discharge: 2018-02-22 | Disposition: A | Discharge status: Home / Self Care | Payer: BLUE CROSS/BLUE SHIELD | Source: Ambulatory Visit | Attending: Family Medicine | Admitting: Family Medicine

## 2018-02-22 DIAGNOSIS — J329 Chronic sinusitis, unspecified: Secondary | ICD-10-CM | POA: Diagnosis not present

## 2018-02-22 DIAGNOSIS — R519 Headache, unspecified: Secondary | ICD-10-CM

## 2018-02-22 DIAGNOSIS — R51 Headache: Principal | ICD-10-CM

## 2018-02-22 MED ORDER — AMPHETAMINE-DEXTROAMPHETAMINE 20 MG PO TABS: 20.0000 mg | ORAL_TABLET | Freq: Two times a day (BID) | ORAL | 0 refills | Status: DC

## 2018-02-22 NOTE — Progress Notes (Signed)
PCP OOTO, patient requested refill Approved by PCP

## 2018-02-23 NOTE — Telephone Encounter (Signed)
Dr Creta Levin please advise. Dgaddy, CMA

## 2018-02-27 ENCOUNTER — Encounter: Payer: Self-pay | Admitting: Physical Therapy

## 2018-02-27 ENCOUNTER — Ambulatory Visit: Payer: BLUE CROSS/BLUE SHIELD | Attending: Family Medicine | Admitting: Physical Therapy

## 2018-02-27 DIAGNOSIS — M25611 Stiffness of right shoulder, not elsewhere classified: Secondary | ICD-10-CM | POA: Insufficient documentation

## 2018-02-27 DIAGNOSIS — M6281 Muscle weakness (generalized): Secondary | ICD-10-CM | POA: Diagnosis not present

## 2018-02-27 DIAGNOSIS — M62838 Other muscle spasm: Secondary | ICD-10-CM

## 2018-02-27 DIAGNOSIS — F411 Generalized anxiety disorder: Secondary | ICD-10-CM | POA: Diagnosis not present

## 2018-02-27 NOTE — Therapy (Signed)
[facility] [address] [place], [place], [address] Phone: [phone]   Fax:  [phone]  Physical Therapy Evaluation  Patient Details  Name: Gary Potts MRN: [id number] Date of Birth: 31-Mar-1981 Referring Provider: Dr Denny Levy   Encounter Date: 02/27/2018  PT End of Session - 02/27/18 1148    Visit Number  1    Number of Visits  12    Date for PT Re-Evaluation  04/10/18    Authorization Type  BCBS    PT Start Time  1148    PT Stop Time  1236    PT Time Calculation (min)  48 min    Activity Tolerance  Patient tolerated treatment well       Past Medical History:  Diagnosis Date  . Anxiety   . Migraines   . Urticaria     Past Surgical History:  Procedure Laterality Date  . CHOLECYSTECTOMY  2014  . CYSTOURETHROSCOPY N/A 08/30/2017   Dr. Bryn Gulling at [facility] Minimally Invasive Gynecologic Surgery  . OOPHORECTOMY Right 2005   found to be PARTIAL right oopherectomy only during 08/2017 total hyst w/ BSO  . TOTAL LAPAROSCOPIC HYSTERECTOMY WITH SALPINGECTOMY Bilateral 08/30/2017   Dr. Bryn Gulling at [facility] Minimally Invasive Gynecologic Surgery; small 1 cm Rt ovarian reminant; no cervical, endometrial, B ovarian, or B fallopian tube pathologic diagnosis inc atypia, dysplasia, or malignancy; no need for continued pap screening    There were no vitals filed for this visit.   Subjective Assessment - 02/27/18 1148    Subjective  Pt reports that late 2017 was working on a car and the car fell on his sternum.  Had a bone bruise with indent in the sternum.  Off/on since then he has had problems with the  Rt shoulder had PT spring of last year, had a lot of massage with minimal results.  He returned to MD and will have his 4th injection next week and these seem to be helping He has been using blue band at home for MD issued exercise.     Diagnostic tests  x-ray and MRI (-) for incidents.  had dx ultrasound and dx with adhesive  capulilitsi    Patient Stated Goals  get the arm so it doesn't get tight/lock up on him anymore.     Currently in Pain?  No/denies   intermittent at night - things lock up.         [facility] PT Assessment - 02/27/18 0001      Assessment   Medical Diagnosis  Rt shoulder adhesive capsulitis    Referring Provider  Dr Denny Levy    Onset Date/Surgical Date  04/06/17    Hand Dominance  Right    Next MD Visit  will be rescheduling end of month    Prior Therapy  yes a little over a year       Precautions   Precautions  --   don't lift weights with arms     Balance Screen   Has the patient fallen in the past 6 months  Yes   09/2017   How many times?  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    Leisure  work out at  gym , kayak, play with dogs , gaming      Observation/Other Assessments   Focus on Therapeutic Outcomes (FOTO)   53% limited      Posture/Postural Control   Posture/Postural Control  Postural limitations    Postural Limitations  Rounded Shoulders;Forward head;Increased lumbar lordosis      ROM / Strength   AROM / PROM / Strength  AROM;Strength      AROM   AROM Assessment Site  Shoulder;Elbow;Cervical    Right/Left Shoulder  Right    Right Shoulder Extension  40 Degrees    Right Shoulder Flexion  74 Degrees    Right Shoulder ABduction  82 Degrees    Right Shoulder Internal Rotation  22 Degrees   able to place hand behind head   Right Shoulder External Rotation  78 Degrees   reach behind back to L2   Right/Left Elbow  --   WNL   Cervical Flexion  22   pain into Rt shoulder   Cervical Extension  42    Cervical - Right Rotation  42   pain into Rt shoulder    Cervical - Left Rotation  77      Strength   Overall Strength Comments  mid back 4/5    Strength  Assessment Site  Shoulder;Elbow    Right/Left Shoulder  Right   Lt WNL   Right Shoulder Flexion  --   5-/5   Right Shoulder Extension  5/5    Right Shoulder ABduction  4+/5    Right Shoulder Internal Rotation  5/5    Right Shoulder External Rotation  4-/5    Right/Left Elbow  Right   Lt WNL   Right Elbow Flexion  5/5    Right Elbow Extension  4/5      Flexibility   Soft Tissue Assessment /Muscle Length  --   very tight pecs bilat     Palpation   Palpation comment  tight and tender in Rt pecs, very tight in Rt upper traps/levators , some tightness in posterior and inferior Rt joint capsule       Special Tests   Other special tests  (+) upper neuron tests Rt UE                Objective measurements completed on examination: See above findings.      [facility] Adult PT Treatment/Exercise - 02/27/18 0001      Exercises   Exercises  Shoulder      Shoulder Exercises: Sidelying   Other Sidelying Exercises  thoracic rotations with shoulder horizontal abduction red band VC for form      Shoulder Exercises: Stretch   Other Shoulder Stretches  mid and low doorway stetches, seated levator and upper trap    Other Shoulder Stretches  thoracic openers over dowel and towel roll             PT Education - 02/27/18 1251    Education Details  HEP, shoulder mechanics and posture    Person(s) Educated  Patient    Methods  Explanation;Demonstration;Handout    Comprehension  Returned demonstration;Verbalized understanding;Verbal cues required          PT Long Term Goals - 02/27/18 1257      PT LONG TERM GOAL #1   Title  I with advanced HEP to include return to gym     Time  6    Period  Weeks    Status  New    Target Date  04/10/18      PT LONG TERM GOAL #2   Title  improve FOTO =/< 36% limited    Time  6    Period  Weeks    Status  New    Target Date  04/10/18      PT LONG TERM GOAL #3   Title  improve Rt shoulder and cervical ROM to allow pt to report =/>  75% reduction in stiffness / locking sensation    Time  6    Period  Weeks    Status  New    Target Date  04/10/18      PT LONG TERM GOAL #4   Title  increase Rt shoulder and mid back strength =/> 5-/5 to allow return to resisted gym exercise    Time  6    Period  Weeks    Status  New    Target Date  04/10/18             Plan - 02/27/18 1252    Clinical Impression Statement  37 yo presents with Rt shoulder issues steming from an accident working on cars about 18 months ago.  They present with decreased Rt shoulder and cervical ROM, decrease Rt shoulder and midback strength,  very tight in the pecs, upper traps and levators Rt along with postural changes placing the shoulder at risk.  Would benefit from PT to decrease muscular tightness, to maintain ROM along with strengthening to balance out the shoulder     Clinical Presentation  Stable    Clinical Decision Making  Low    Rehab Potential  Excellent    PT Frequency  2x / week    PT Duration  6 weeks    PT Treatment/Interventions  Neuromuscular re-education;Dry needling;Manual techniques;Moist Heat;Ultrasound;Patient/family education;Taping;Vasopneumatic Device;Cryotherapy;Microbiologist;Therapeutic exercise;Passive range of motion    PT Next Visit Plan  DN/manual work to Rt pecs/upper traps/levators, postural strengthening    Consulted and Agree with Plan of Care  Patient       Patient will benefit from skilled therapeutic intervention in order to improve the following deficits and impairments:  Pain, Improper body mechanics, Postural dysfunction, Increased muscle spasms, Decreased range of motion, Decreased strength, Impaired UE functional use  Visit Diagnosis: Stiffness of right shoulder, not elsewhere classified - Plan: PT plan of care cert/re-cert  Muscle weakness (generalized) - Plan: PT plan of care cert/re-cert  Other muscle spasm - Plan: PT plan of care cert/re-cert     Problem  List Patient Active Problem List   Diagnosis Date Noted  . Adhesive capsulitis of right shoulder 01/19/2018  . Generalized anxiety disorder 07/02/2017  . Attention deficit hyperactivity disorder (ADHD), combined type 07/02/2017  . Benzodiazepine dependence, continuous (HCC) 07/02/2017  . High risk medication use 07/02/2017  . Endocrine disorder 07/02/2017  . Chronic urticaria 01/23/2017  . 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 Reactive airway disease 12/30/2016  . Other allergic rhinitis 12/30/2016  . Dermographia 12/30/2016    Rose Fillers rPT  02/27/2018, 1:02 PM  [facility] [address] [place], [place], [address] Phone: [phone]   Fax:  [phone]  Name: Gary Potts MRN: [id number] Date of Birth: 01/28/1981

## 2018-02-27 NOTE — Patient Instructions (Addendum)

## 2018-03-01 DIAGNOSIS — F411 Generalized anxiety disorder: Secondary | ICD-10-CM | POA: Diagnosis not present

## 2018-03-03 MED ORDER — AMPHETAMINE-DEXTROAMPHETAMINE 20 MG PO TABS: 20.0000 mg | ORAL_TABLET | Freq: Two times a day (BID) | ORAL | 0 refills | Status: DC

## 2018-03-06 DIAGNOSIS — F411 Generalized anxiety disorder: Secondary | ICD-10-CM | POA: Diagnosis not present

## 2018-03-08 ENCOUNTER — Other Ambulatory Visit: Payer: Self-pay | Admitting: *Deleted

## 2018-03-08 ENCOUNTER — Other Ambulatory Visit: Payer: Self-pay | Admitting: Family Medicine

## 2018-03-08 ENCOUNTER — Ambulatory Visit
Admission: RE | Admit: 2018-03-08 | Discharge: 2018-03-08 | Disposition: A | Discharge status: Home / Self Care | Payer: BLUE CROSS/BLUE SHIELD | Source: Ambulatory Visit | Attending: Family Medicine | Admitting: Family Medicine

## 2018-03-08 DIAGNOSIS — Z803 Family history of malignant neoplasm of breast: Secondary | ICD-10-CM

## 2018-03-08 DIAGNOSIS — N6489 Other specified disorders of breast: Secondary | ICD-10-CM | POA: Diagnosis not present

## 2018-03-08 DIAGNOSIS — Z1501 Genetic susceptibility to malignant neoplasm of breast: Secondary | ICD-10-CM

## 2018-03-08 DIAGNOSIS — F411 Generalized anxiety disorder: Secondary | ICD-10-CM | POA: Diagnosis not present

## 2018-03-08 DIAGNOSIS — Z1509 Genetic susceptibility to other malignant neoplasm: Principal | ICD-10-CM

## 2018-03-08 DIAGNOSIS — R922 Inconclusive mammogram: Secondary | ICD-10-CM | POA: Diagnosis not present

## 2018-03-09 ENCOUNTER — Ambulatory Visit: Payer: BLUE CROSS/BLUE SHIELD | Admitting: Family Medicine

## 2018-03-09 DIAGNOSIS — K449 Diaphragmatic hernia without obstruction or gangrene: Secondary | ICD-10-CM | POA: Diagnosis not present

## 2018-03-09 DIAGNOSIS — R1310 Dysphagia, unspecified: Secondary | ICD-10-CM | POA: Diagnosis not present

## 2018-03-09 DIAGNOSIS — K222 Esophageal obstruction: Secondary | ICD-10-CM | POA: Diagnosis not present

## 2018-03-09 DIAGNOSIS — K229 Disease of esophagus, unspecified: Secondary | ICD-10-CM | POA: Diagnosis not present

## 2018-03-09 DIAGNOSIS — R198 Other specified symptoms and signs involving the digestive system and abdomen: Secondary | ICD-10-CM | POA: Diagnosis not present

## 2018-03-09 DIAGNOSIS — L501 Idiopathic urticaria: Secondary | ICD-10-CM | POA: Diagnosis not present

## 2018-03-09 DIAGNOSIS — K219 Gastro-esophageal reflux disease without esophagitis: Secondary | ICD-10-CM | POA: Diagnosis not present

## 2018-03-13 ENCOUNTER — Ambulatory Visit: Payer: BLUE CROSS/BLUE SHIELD | Admitting: Physical Therapy

## 2018-03-13 ENCOUNTER — Encounter: Payer: Self-pay | Admitting: Physical Therapy

## 2018-03-13 DIAGNOSIS — M6281 Muscle weakness (generalized): Secondary | ICD-10-CM | POA: Diagnosis not present

## 2018-03-13 DIAGNOSIS — M62838 Other muscle spasm: Secondary | ICD-10-CM | POA: Diagnosis not present

## 2018-03-13 DIAGNOSIS — M25611 Stiffness of right shoulder, not elsewhere classified: Secondary | ICD-10-CM

## 2018-03-13 NOTE — Therapy (Signed)
[facility] [address] [place], [place], [id number] Phone: [phone]   Fax:  [phone]  Physical Therapy Treatment  Patient Details  Name: Gary CourserGauge Leiland Potts MRN: [id number] Date of Birth: [birth year] Referring Provider: Dr Denny LevySara Neal   Encounter Date: 03/13/2018  PT End of Session - 03/13/18 1714    Visit Number  2    Number of Visits  12    Date for PT Re-Evaluation  04/10/18    Authorization Type  BCBS    PT Start Time  1714    PT Stop Time  1756    PT Time Calculation (min)  42 min    Activity Tolerance  Patient tolerated treatment well    Behavior During Therapy  [facility] for tasks assessed/performed       Past Medical History:  Diagnosis Date  . Anxiety   . Migraines   . Urticaria     Past Surgical History:  Procedure Laterality Date  . CHOLECYSTECTOMY  2014  . CYSTOURETHROSCOPY N/A 08/30/2017   [facility]  . OOPHORECTOMY Right 2005   found to be PARTIAL right oopherectomy only during 08/2017 total hyst w/ BSO  . TOTAL LAPAROSCOPIC HYSTERECTOMY WITH SALPINGECTOMY Bilateral 08/30/2017   [facility]; small 1 cm Rt ovarian reminant; no cervical, endometrial, B ovarian, or B fallopian tube pathologic diagnosis inc atypia, dysplasia, or malignancy; no need for continued pap screening    There were no vitals filed for this visit.  Subjective Assessment - 03/13/18 1714    Subjective  Not too bad today. Just a little pain. Jabbing annoyance that comes and goes.     Patient Stated Goals  get the arm so it doesn't get tight/lock up on him anymore.     Currently in Pain?  No/denies                       [facility]Ex[facility] - 03/13/18 0001      Exercises   Exercises  Shoulder      Shoulder Exercises: Standing   External Rotation  20 reps    Theraband Level (Shoulder External  Rotation)  Level 2 (Red)    External Rotation Limitations  bil    ABduction Limitations  snow angels at wall    Row Limitations  wide & narrow    Other Standing Exercises  bil GHJ AROM in mirror      Shoulder Exercises: Pulleys   Flexion  2 minutes      Shoulder Exercises: Stretch   Internal Rotation Stretch Limitations  IR with strap    Wall Stretch - Flexion  5 reps;10 seconds    Other Shoulder Stretches  mid  & upper door pec stretch    Other Shoulder Stretches  upper trap stretch with first rib mob      Manual Therapy   Manual Therapy  Soft tissue mobilization;Joint mobilization    Manual therapy comments  skilled palpation and monitoring during TPDN    Joint Mobilization  Rt first rib depression    Soft tissue mobilization  Rt upper trap       Trigger Point Dry Needling - 03/13/18 1718    Consent Given?  Yes    Education Handout Provided  --   verbal education   Muscles Treated Upper Body  Upper trapezius   Rt   Upper Trapezius Response  Twitch  reponse elicited;Palpable increased muscle length           PT Education - 03/13/18 1801    Education Details  TPDN & expected outcomes, anatomy of condition, exercise form/rationale, HEP    Person(s) Educated  Patient    Methods  Explanation;Demonstration;Tactile cues;Verbal cues;Handout    Comprehension  Verbalized understanding;Returned demonstration;Verbal cues required;Tactile cues required;Need further instruction          PT Long Term Goals - 02/27/18 1257      PT LONG TERM GOAL #1   Title  I with advanced HEP to include return to gym     Time  6    Period  Weeks    Status  New    Target Date  04/10/18      PT LONG TERM GOAL #2   Title  improve FOTO =/< 36% limited    Time  6    Period  Weeks    Status  New    Target Date  04/10/18      PT LONG TERM GOAL #3   Title  improve Rt shoulder and cervical ROM to allow pt to report =/> 75% reduction in stiffness / locking sensation    Time  6    Period   Weeks    Status  New    Target Date  04/10/18      PT LONG TERM GOAL #4   Title  increase Rt shoulder and mid back strength =/> 5-/5 to allow return to resisted gym exercise    Time  6    Period  Weeks    Status  New    Target Date  04/10/18            Plan - 03/13/18 1758    Clinical Impression Statement  good tolerance to TPDN with notable decrease in upper trap tightness. able to perform improved horiz abd after DN upper trap so pecs were not needled. Pt denied pain with movement but did have difficulty avoiding compensation with upper traps. Will cont to benefit from periscapular strengthening and training for proper GHJ movement.     PT Treatment/Interventions  Neuromuscular re-education;Dry needling;Manual techniques;Moist Heat;Ultrasound;Patient/family education;Taping;Vasopneumatic Device;Cryotherapy;Microbiologist;Therapeutic exercise;Passive range of motion    PT Next Visit Plan  DN PRN, deltoid strengthening, periscap strengthening, able to decrease upper traps with eliptical?    PT Home Exercise Plan  scap retraction, first rib depression with sheet, upper trap stretch, rows, ER red tband    Consulted and Agree with Plan of Care  Patient       Patient will benefit from skilled therapeutic intervention in order to improve the following deficits and impairments:  Pain, Improper body mechanics, Postural dysfunction, Increased muscle spasms, Decreased range of motion, Decreased strength, Impaired UE functional use  Visit Diagnosis: Stiffness of right shoulder, not elsewhere classified  Muscle weakness (generalized)  Other muscle spasm     Problem List Patient Active Problem List   Diagnosis Date Noted  . Adhesive capsulitis of right shoulder 01/19/2018  . Generalized anxiety disorder 07/02/2017  . Attention deficit hyperactivity disorder (ADHD), combined type 07/02/2017  . Benzodiazepine dependence, continuous (HCC) 07/02/2017  . High  risk medication use 07/02/2017  . Endocrine disorder 07/02/2017  . Chronic urticaria 01/23/2017  . 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 Reactive airway disease 12/30/2016  . Other allergic rhinitis 12/30/2016  . Dermographia 12/30/2016    Aksh Swart C. Yoniel Arkwright PT, DPT 03/13/18 6:02 PM  [facility] [address] [place], [place], [address] Phone: [phone]   Fax:  [phone]  Name: Gary Potts MRN: [id number] Date of Birth: June 18, 1981

## 2018-03-14 ENCOUNTER — Ambulatory Visit: Payer: Self-pay

## 2018-03-15 ENCOUNTER — Ambulatory Visit: Payer: BLUE CROSS/BLUE SHIELD | Admitting: Physical Therapy

## 2018-03-15 DIAGNOSIS — F411 Generalized anxiety disorder: Secondary | ICD-10-CM | POA: Diagnosis not present

## 2018-03-16 ENCOUNTER — Ambulatory Visit (INDEPENDENT_AMBULATORY_CARE_PROVIDER_SITE_OTHER): Payer: BLUE CROSS/BLUE SHIELD | Admitting: Family Medicine

## 2018-03-16 VITALS — BP 110/84 | Ht 64.0 in | Wt 168.0 lb

## 2018-03-16 DIAGNOSIS — M7501 Adhesive capsulitis of right shoulder: Secondary | ICD-10-CM

## 2018-03-16 MED ORDER — METHYLPREDNISOLONE ACETATE 40 MG/ML IJ SUSP
40.0000 mg | Freq: Once | INTRAMUSCULAR | Status: AC
  Administered 2018-03-16: 40 mg via INTRA_ARTICULAR

## 2018-03-19 ENCOUNTER — Encounter: Payer: Self-pay | Admitting: Family Medicine

## 2018-03-19 ENCOUNTER — Telehealth: Payer: Self-pay | Admitting: Hematology and Oncology

## 2018-03-19 ENCOUNTER — Inpatient Hospital Stay: Payer: BLUE CROSS/BLUE SHIELD | Attending: Hematology and Oncology | Admitting: Hematology and Oncology

## 2018-03-19 DIAGNOSIS — Z803 Family history of malignant neoplasm of breast: Secondary | ICD-10-CM

## 2018-03-19 DIAGNOSIS — Z1231 Encounter for screening mammogram for malignant neoplasm of breast: Secondary | ICD-10-CM | POA: Insufficient documentation

## 2018-03-19 DIAGNOSIS — Z8041 Family history of malignant neoplasm of ovary: Secondary | ICD-10-CM | POA: Diagnosis not present

## 2018-03-19 DIAGNOSIS — F649 Gender identity disorder, unspecified: Secondary | ICD-10-CM

## 2018-03-19 DIAGNOSIS — Z1239 Encounter for other screening for malignant neoplasm of breast: Secondary | ICD-10-CM | POA: Insufficient documentation

## 2018-03-19 NOTE — Telephone Encounter (Signed)
Gave avs and calendar ° °

## 2018-03-19 NOTE — Progress Notes (Signed)
[place] NOTE  Patient Care Team: Shawnee Knapp, MD as PCP - General (Family Medicine) Becky Sax, MD as Referring Physician (Orthopedic Surgery) Charlies Silvers, MD as Consulting Physician (Allergy and Immunology) Renda Rolls Jennefer Bravo, MD as Referring Physician (Dermatology) Lake Bells., MD as Referring Physician (Gastroenterology) [facility] [place] (Ophthalmology) Lenice Pressman, MD as Referring Physician (Gynecology) Cristine Polio, MD as Consulting Physician (Plastic Surgery)  CHIEF COMPLAINTS/PURPOSE OF CONSULTATION:  BRCA2 VUS, high risk  HISTORY OF PRESENTING ILLNESS:  [facility] 37 y.o. adult is here because of recent diagnosis of BRCA2 VUS.  Patient sister had a prior diagnosis of breast cancer at young age and has a grandmother who also died for breast cancer.  Patient was undergoing extensive surgical procedures for helping with being a transgender individual.  He had a prior hysterectomy and bilateral salpingo-nephrectomy.  He is in the works for undergoing liposuction on his breasts.  Prior to this breast surgery his surgeon wanted to make sure patient does not have high risk of breast cancer to warrant mastectomies.  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  I reviewed her records extensively and collaborated the history with the patient.  MEDICAL HISTORY:  Past Medical History:  Diagnosis Date  . Anxiety   . Migraines   . Urticaria     SURGICAL HISTORY: Past Surgical History:  Procedure Laterality Date  . CHOLECYSTECTOMY  2014  . CYSTOURETHROSCOPY N/A 08/30/2017   Dr. Paul Dykes at [facility] Minimally Invasive Gynecologic Surgery  . OOPHORECTOMY Right 2005   found to be PARTIAL right oopherectomy only during 08/2017 total hyst w/ BSO  . TOTAL LAPAROSCOPIC HYSTERECTOMY WITH SALPINGECTOMY Bilateral 08/30/2017   Dr. Paul Dykes at [facility] Minimally Invasive Gynecologic  Surgery; small 1 cm Rt ovarian reminant; no cervical, endometrial, B ovarian, or B fallopian tube pathologic diagnosis inc atypia, dysplasia, or malignancy; no need for continued pap screening    SOCIAL HISTORY: Social History   Socioeconomic History  . Marital status: Married    Spouse name: Psychiatrist  . Number of children: 0  . Years of education: Not on file  . Highest education level: Bachelor's degree (e.g., BA, AB, BS)  Occupational History  . Occupation: Presenter, broadcasting  . Occupation: Ship broker    Comment: possible law school starting summer or fall 2019  Social Needs  . Financial resource strain: Not on file  . Food insecurity:    Worry: Not on file    Inability: Not on file  . Transportation needs:    Medical: Not on file    Non-medical: Not on file  Tobacco Use  . Smoking status: Never Smoker  . Smokeless tobacco: Never Used  Substance and Sexual Activity  . Alcohol use: Not Currently    Comment: rare- stop 06/20/2017  . Drug use: No  . Sexual activity: Yes    Partners: Female    Birth control/protection: Other-see comments, Surgical    Comment: pt is trans-male sexually involved with cis-females so no sperm involved in encounters  Lifestyle  . Physical activity:    Days per week: Not on file    Minutes per session: Not on file  . Stress: Not on file  Relationships  . Social connections:    Talks on phone: Not on file    Gets together: Not on file    Attends religious service: Not on file    Active member of  club or organization: Not on file    Attends meetings of clubs or organizations: Not on file    Relationship status: Not on file  . Intimate partner violence:    Fear of current or ex partner: Not on file    Emotionally abused: Not on file    Physically abused: Not on file    Forced sexual activity: Not on file  Other Topics Concern  . Not on file  Social History Narrative   Given name - "[place] - changed 06/2016.   Lives with wife  [name].   Works [place].    FAMILY HISTORY: Family History  Problem Relation Age of Onset  . Allergic rhinitis Mother   . Sinusitis Mother   . Breast cancer Mother   . Sinusitis Sister   . Urticaria Sister   . Breast cancer Sister   . Ovarian cancer Maternal Grandmother   . Breast cancer Maternal Grandmother   . Angioedema Neg Hx   . Asthma Neg Hx   . Eczema Neg Hx   . Immunodeficiency Neg Hx     ALLERGIES:  is allergic to penicillins; shellfish allergy; and latex.  MEDICATIONS:  Current Outpatient Medications  Medication Sig Dispense Refill  . albuterol (PROAIR HFA) 108 (90 Base) MCG/ACT inhaler Inhale 2 puffs into the lungs every 4 (four) hours as needed for wheezing or shortness of breath. 1 Inhaler 2  . amphetamine-dextroamphetamine (ADDERALL) 20 MG tablet Take 1 tablet (20 mg total) by mouth 2 (two) times daily. 60 tablet 0  . [START ON 03/23/2018] amphetamine-dextroamphetamine (ADDERALL) 20 MG tablet Take 1 tablet (20 mg total) by mouth 2 (two) times daily. 60 tablet 0  . Ascorbic Acid (VITAMIN C) 500 MG CHEW Chew 500 mg by mouth daily.    . chlorhexidine (PERIDEX) 0.12 % solution Use as directed 15 mLs in the mouth or throat 2 (two) times daily. Swish, gargle, and spit out as needed to treat/prevent gum abscess 500 mL 1  . clarithromycin (BIAXIN) 125 MG/5ML suspension Take 10 mLs (250 mg total) by mouth 2 (two) times daily. 200 mL 0  . clindamycin (CLEOCIN T) 1 % lotion Apply topically 2 (two) times daily. 60 mL 2  . clonazePAM (KLONOPIN) 0.5 MG tablet Take 1 tablet (0.5 mg total) by mouth 2 (two) times daily as needed for anxiety. 60 tablet 1  . cyclobenzaprine (FLEXERIL) 5 MG tablet   3  . diclofenac sodium (VOLTAREN) 1 % GEL Apply 2 g topically 2 (two) times daily as needed. 100 g 5  . EPINEPHrine (EPIPEN 2-PAK) 0.3 mg/0.3 mL IJ SOAJ injection Use as directed for severe allergic reaction. 2 Device 1  . fluticasone (FLONASE) 50 MCG/ACT nasal spray Place 2 sprays  into both nostrils daily as needed (for stuffy nose). 16 g 5  . Gabapentin 300 MG/6ML SOLN Take 6-12 mLs by mouth at bedtime. 360 mL 2  . hydrOXYzine (ATARAX/VISTARIL) 25 MG tablet Take 1 tablet (25 mg total) by mouth at bedtime. 30 tablet 5  . ibuprofen (ADVIL,MOTRIN) 600 MG tablet   1  . levocetirizine (XYZAL) 5 MG tablet Take 1 tablet (5 mg total) by mouth every evening. 30 tablet 5  . montelukast (SINGULAIR) 10 MG tablet One tablet by mouth once a day for coughing or wheezing. 34 tablet 5  . pantoprazole (PROTONIX) 40 MG tablet Take 1 tablet (40 mg total) by mouth daily. 30 minutes before a meal 30 tablet 1  . predniSONE (DELTASONE) 20 MG tablet  Take 4 tabs qd x 1d, then 3 tabs qd x 2d then 2 tab qd x 3d then 1 tabs x 3d, then 1/2 tab x 2d, then off. 21 tablet 0  . propranolol (INDERAL) 40 MG tablet Take 1 tablet (40 mg total) by mouth every 4 (four) hours as needed (palpitations, tremor, anxiety). 60 tablet 0  . ranitidine (ZANTAC) 150 MG tablet TAKE 1 TABLET BY MOUTH TWICE DAILY FOR ITCHING - IT MAY HELP HEARTBURN 60 tablet 5  . testosterone cypionate (DEPOTESTOSTERONE CYPIONATE) 200 MG/ML injection Inject 0.3 mLs (60 mg total) into the muscle once a week. 2 mL 1   Current Facility-Administered Medications  Medication Dose Route Frequency Provider Last Rate Last Dose  . omalizumab Arvid Right) injection 300 mg  300 mg Subcutaneous Q28 days Charlies Silvers, MD   300 mg at 02/14/18 1159    REVIEW OF SYSTEMS:   Constitutional: Denies fevers, chills or abnormal night sweats Eyes: Denies blurriness of vision, double vision or watery eyes Ears, nose, mouth, throat, and face: Denies mucositis or sore throat Respiratory: Denies cough, dyspnea or wheezes Cardiovascular: Denies palpitation, chest discomfort or lower extremity swelling Gastrointestinal:  Denies nausea, heartburn or change in bowel habits Skin: Denies abnormal skin rashes Lymphatics: Denies new lymphadenopathy or easy  bruising Neurological:Denies numbness, tingling or new weaknesses Behavioral/Psych: Mood is stable, no new changes  Breast:  Denies any palpable lumps or discharge All other systems were reviewed with the patient and are negative.  PHYSICAL EXAMINATION: ECOG PERFORMANCE STATUS: 1 - Symptomatic but completely ambulatory  Vitals:   03/19/18 1234  BP: 131/84  Pulse: (!) 101  Resp: 18  Temp: 97.7 F (36.5 C)  SpO2: 99%   Filed Weights   03/19/18 1234  Weight: 171 lb 8 oz (77.8 kg)    GENERAL:alert, no distress and comfortable SKIN: skin color, texture, turgor are normal, no rashes or significant lesions EYES: normal, conjunctiva are pink and non-injected, sclera clear OROPHARYNX:no exudate, no erythema and lips, buccal mucosa, and tongue normal  NECK: supple, thyroid normal size, non-tender, without nodularity LYMPH:  no palpable lymphadenopathy in the cervical, axillary or inguinal LUNGS: clear to auscultation and percussion with normal breathing effort HEART: regular rate & rhythm and no murmurs and no lower extremity edema ABDOMEN:abdomen soft, non-tender and normal bowel sounds Musculoskeletal:no cyanosis of digits and no clubbing  PSYCH: alert & oriented x 3 with fluent speech NEURO: no focal motor/sensory deficits   LABORATORY DATA:  I have reviewed the data as listed Lab Results  Component Value Date   WBC 12.9 (A) 02/17/2018   HGB 15.2 02/17/2018   HCT 49.8 02/17/2018   MCV 80.1 02/17/2018   PLT 336 01/18/2018   Lab Results  Component Value Date   NA 140 01/18/2018   K 4.7 01/18/2018   CL 99 01/18/2018   CO2 26 01/18/2018    RADIOGRAPHIC STUDIES: I have personally reviewed the radiological reports and agreed with the findings in the report.  ASSESSMENT AND PLAN:  Breast cancer screening, high risk patient Transgender gentleman on testosterone replacement therapy. Status post hysterectomy and bilateral salpingo-nephrectomy.  BRCA2 VUS Tyrer Cusiq  breast cancer risk: 34% lifetime risk Family history of sister and grandmother with breast cancers  Recommendation: 1.  BRCA2 V [place]: I discussed at length about the meaning of VUS.  There is no need to change treatment plan based upon this finding.  Patient fully understands that sometimes these VUS's can be re-categorized into normal or abnormal  types if there is enough data to support it. 2. high lifetime risk of breast cancer: Patient is scheduled to undergo liposuction on the breast tissue.  Because the breast architecture is still preserved patient will still need annual mammograms and annual breast MRIs. I ordered a breast MRI for surveillance. Return to clinic once a year for follow-up  All questions were answered. The patient knows to call the clinic with any problems, questions or concerns.    Harriette Ohara, MD 03/19/18

## 2018-03-19 NOTE — Assessment & Plan Note (Signed)
He has made some headway.  Has started formal physical therapy which I think will be good.  Also gave him another handout with blue Thera-Band encouraged really pushing range of motion.  We will see him back in 4 weeks.  Hopefully we will not need to do an injection at that time as I suspect he will make another 20% improvement.

## 2018-03-19 NOTE — Assessment & Plan Note (Signed)
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 Status post hysterectomy and bilateral salpingo-nephrectomy.  BRCA2 VUS Tyrer Cusiq breast cancer risk: 34% lifetime risk Family history of sister and grandmother with breast cancers  Recommendation: 1.  BRCA2 V [place]: I discussed at length about the meaning of VUS.  There is no need to change treatment plan based upon this finding.  Patient fully understands that sometimes these VUS's can be re-categorized into normal or abnormal types if there is enough data to support it. 2. high lifetime risk of breast cancer: Patient is scheduled to undergo liposuction on the breast tissue.  Because the breast architecture is still preserved patient will still need annual mammograms and annual breast MRIs. I ordered a breast MRI for surveillance.

## 2018-03-19 NOTE — Progress Notes (Signed)
    CHIEF COMPLAINT / HPI: Follow-up right shoulder pain and stiffness.  Some better.  About 10%.  OBJ:  SHOULDER: Right.  Abduction 120 degrees, forward flexion  120 degrees.ER  25 degrees, IR 30 degrees. NEURO: Soft touch intact bilateral upper extremities.  Normal strength in C 4-5- 6 muscle groups  PROCEDURE: INJECTION: Patient was given informed consent, signed copy in the chart. Appropriate time out was taken. Area prepped and draped in usual sterile fashion. Ethyl chloride was  used for local anesthesia. A 21 Sandeep 1 1/2 inch needle was used.. 1 cc of methylprednisolone 40 mg/ml plus 8 cc of 1% lidocaine without epinephrine was injected into the glenohumeral joint using a(n) US guided approach.   The patient tolerated the procedure well. There were no complications. Post procedure instructions were given.

## 2018-03-20 ENCOUNTER — Ambulatory Visit: Payer: BLUE CROSS/BLUE SHIELD | Attending: Family Medicine | Admitting: Physical Therapy

## 2018-03-20 DIAGNOSIS — M62838 Other muscle spasm: Secondary | ICD-10-CM | POA: Insufficient documentation

## 2018-03-20 DIAGNOSIS — M25611 Stiffness of right shoulder, not elsewhere classified: Secondary | ICD-10-CM | POA: Diagnosis not present

## 2018-03-20 DIAGNOSIS — M6281 Muscle weakness (generalized): Secondary | ICD-10-CM | POA: Diagnosis not present

## 2018-03-20 DIAGNOSIS — F411 Generalized anxiety disorder: Secondary | ICD-10-CM | POA: Diagnosis not present

## 2018-03-20 NOTE — Therapy (Signed)
[facility] [address] [place], [place], [address] Phone: [phone]   Fax:  [phone]  Physical Therapy Treatment  Patient Details  Name: Gary Potts MRN: [id number] Date of Birth: 1981/06/17 Referring Provider (PT): Dr Denny Levy   Encounter Date: 03/20/2018  PT End of Session - 03/20/18 1644    Visit Number  3    Number of Visits  12    Date for PT Re-Evaluation  04/10/18    Authorization Type  BCBS    PT Start Time  1545    PT Stop Time  1631    PT Time Calculation (min)  46 min    Activity Tolerance  Patient tolerated treatment well    Behavior During Therapy  [facility] for tasks assessed/performed       Past Medical History:  Diagnosis Date  . Anxiety   . Migraines   . Urticaria     Past Surgical History:  Procedure Laterality Date  . CHOLECYSTECTOMY  2014  . CYSTOURETHROSCOPY N/A 08/30/2017   Dr. Bryn Gulling at [facility] Minimally Invasive Gynecologic Surgery  . OOPHORECTOMY Right 2005   found to be PARTIAL right oopherectomy only during 08/2017 total hyst w/ BSO  . TOTAL LAPAROSCOPIC HYSTERECTOMY WITH SALPINGECTOMY Bilateral 08/30/2017   Dr. Bryn Gulling at [facility] Minimally Invasive Gynecologic Surgery; small 1 cm Rt ovarian reminant; no cervical, endometrial, B ovarian, or B fallopian tube pathologic diagnosis inc atypia, dysplasia, or malignancy; no need for continued pap screening    There were no vitals filed for this visit.  Subjective Assessment - 03/20/18 1642    Subjective  Primary limitation for pain and tightness is difficulty reaching right arm behind back. Pt. reports dry needling to right upper trapezius performed last session was helpful. Decreased pain noted with shoulder flexion motion.    Patient Stated Goals  get the arm so it doesn't get tight/lock up on him anymore.     Currently in Pain?  No/denies                       [facility] Adult PT Treatment/Exercise - 03/20/18  0001      Exercises   Exercises  Shoulder      Shoulder Exercises: Supine   Other Supine Exercises  Right shoulder PROM/stretching all planes but focus IR      Shoulder Exercises: Standing   External Rotation  Right;20 reps    Theraband Level (Shoulder External Rotation)  Level 2 (Red)    Internal Rotation  Right;20 reps    Theraband Level (Shoulder Internal Rotation)  Level 2 (Red)    Extension  Strengthening    Theraband Level (Shoulder Extension)  Level 2 (Red)    Row  Both    Theraband Level (Shoulder Row)  Level 3 (Green)      Shoulder Exercises: Research officer, trade union Stretch  3 reps;30 seconds      Manual Therapy   Manual Therapy  Soft tissue mobilization    Manual therapy comments  skilled palpation and monitoring during TPDN    Joint Mobilization  GH mobilization AP and caudal glides grade I-IV    Soft tissue mobilization  STM right posterior scapular region, subscapularis       Trigger Point Dry Needling - 03/20/18 1652    Consent Given?  Yes    Muscles Treated Upper Body  Upper trapezius;Infraspinatus   also needled teres minor and posterior deltoid   Upper Trapezius Response  Twitch reponse elicited    Infraspinatus Response  Twitch response elicited           PT Education - 03/20/18 1643    Education Details  etiology frozen shoulder, muscular anatomy, (avoiding) compensatory upper trapezius use    Person(s) Educated  Patient    Methods  Explanation;Demonstration;Verbal cues    Comprehension  Verbalized understanding          PT Long Term Goals - 02/27/18 1257      PT LONG TERM GOAL #1   Title  I with advanced HEP to include return to gym     Time  6    Period  Weeks    Status  New    Target Date  04/10/18      PT LONG TERM GOAL #2   Title  improve FOTO =/< 36% limited    Time  6    Period  Weeks    Status  New    Target Date  04/10/18      PT LONG TERM GOAL #3   Title  improve Rt shoulder and cervical ROM to allow pt to report =/> 75%  reduction in stiffness / locking sensation    Time  6    Period  Weeks    Status  New    Target Date  04/10/18      PT LONG TERM GOAL #4   Title  increase Rt shoulder and mid back strength =/> 5-/5 to allow return to resisted gym exercise    Time  6    Period  Weeks    Status  New    Target Date  04/10/18            Plan - 03/20/18 1644    Clinical Impression Statement  Continued addition of TPDN due to positive response from last session. Included upper trapezius but also focus posterior scapular STM and brief needling to address posterior shoulder tightness and address IR ROM limitations. Continues with some tendency compensatory upper trapezius use with shoulder elevation ROM.    PT Treatment/Interventions  Neuromuscular re-education;Dry needling;Manual techniques;Moist Heat;Ultrasound;Patient/family education;Taping;Vasopneumatic Device;Cryotherapy;Microbiologist;Therapeutic exercise;Passive range of motion    PT Next Visit Plan  Continue manual techniques, shoulder stretching, periscpular strengthening, further dry needling prn, taping prn    PT Home Exercise Plan  scap retraction, first rib depression with sheet, upper trap stretch, rows, ER red tband    Consulted and Agree with Plan of Care  Patient       Patient will benefit from skilled therapeutic intervention in order to improve the following deficits and impairments:  Pain, Improper body mechanics, Postural dysfunction, Increased muscle spasms, Decreased range of motion, Decreased strength, Impaired UE functional use  Visit Diagnosis: Stiffness of right shoulder, not elsewhere classified  Muscle weakness (generalized)  Other muscle spasm     Problem List Patient Active Problem List   Diagnosis Date Noted  . Breast cancer screening, high risk patient 03/19/2018  . Adhesive capsulitis of right shoulder 01/19/2018  . Generalized anxiety disorder 07/02/2017  . Attention deficit  hyperactivity disorder (ADHD), combined type 07/02/2017  . Benzodiazepine dependence, continuous (HCC) 07/02/2017  . High risk medication use 07/02/2017  . Endocrine disorder 07/02/2017  . Chronic urticaria 01/23/2017  . 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 Reactive airway disease 12/30/2016  . Other allergic rhinitis 12/30/2016  . Dermographia 12/30/2016    Willford Rabideau S Avika Carbine, PT, DPT 03/20/2018, 4:57 PM  [facility] [address] [place], [place], [address] Phone: [phone]   Fax:  [phone]  Name: Gary Potts MRN: [id number] Date of Birth: January 20, 1981

## 2018-03-21 ENCOUNTER — Ambulatory Visit (INDEPENDENT_AMBULATORY_CARE_PROVIDER_SITE_OTHER): Payer: BLUE CROSS/BLUE SHIELD | Admitting: Family Medicine

## 2018-03-21 ENCOUNTER — Other Ambulatory Visit: Payer: Self-pay

## 2018-03-21 ENCOUNTER — Encounter: Payer: Self-pay | Admitting: Family Medicine

## 2018-03-21 VITALS — BP 127/81 | HR 77 | Temp 98.5°F | Resp 16 | Ht 64.0 in | Wt 169.0 lb

## 2018-03-21 DIAGNOSIS — H60393 Other infective otitis externa, bilateral: Secondary | ICD-10-CM | POA: Diagnosis not present

## 2018-03-21 DIAGNOSIS — F909 Attention-deficit hyperactivity disorder, unspecified type: Secondary | ICD-10-CM

## 2018-03-21 DIAGNOSIS — J029 Acute pharyngitis, unspecified: Secondary | ICD-10-CM

## 2018-03-21 MED ORDER — AMPHETAMINE-DEXTROAMPHETAMINE 20 MG PO TABS: 20.0000 mg | ORAL_TABLET | Freq: Two times a day (BID) | ORAL | 0 refills | Status: DC

## 2018-03-21 MED ORDER — CEFDINIR 300 MG PO CAPS: 600.0000 mg | ORAL_CAPSULE | Freq: Every day | ORAL | 0 refills | Status: DC

## 2018-03-21 MED ORDER — NEOMYCIN-POLYMYXIN-HC 3.5-10000-1 OT SOLN: 3.0000 [drp] | Freq: Four times a day (QID) | OTIC | 0 refills | Status: DC

## 2018-03-21 NOTE — Progress Notes (Signed)
Patient ID: [name], adult    DOB: 07/07/1980  Age: 37 y.o. MRN: [id number]  Chief Complaint  Patient presents with  . Ear Pain    with itching and pain   . Nasal Congestion    x 3 days   . Medication Refill    need adderall and Peridex     Subjective:   37 year old man who is a patient of Dr. Norberto Sorenson.  He has been on ADHD medication with Adderall for a long time, but his doctor is off for a couple of months so he is here for a refill.  He uses his regular medicine twice a day.  He works a Passenger transport manager.  In addition to this he is recurrent problems with external otitis.  He has a sore throat little cough, but both ears hurt and itch.  He has nasal congestion also.  He does not smoke.  Current allergies, medications, problem list, past/family and social histories reviewed.  Objective:  BP 127/81   Pulse 77   Temp 98.5 F (36.9 C) (Oral)   Resp 16   Ht 5\' 4"  (1.626 m)   Wt 169 lb (76.7 kg)   SpO2 100%   BMI 29.01 kg/m   No major distress.  TMs normal.  The ear canals are very painful on motion of the external earlobes, but cannot be real well visualized due to the lining of wax surrounding both ear canals.  He is neck is supple with no major nodes.  Throat erythematous without exudate.  Mild nasal congestion.  No major sinus tenderness.  Chest is clear to auscultation.  Heart regular without murmur.  Assessment & Plan:   Assessment: 1. Infective otitis externa of both ears   2. Acute pharyngitis, unspecified etiology   3. Attention deficit hyperactivity disorder (ADHD), unspecified ADHD type       Plan: We will go ahead and refill his medication and his doctors absence.  He has been successfully treated with Cortisporin Otic and with Omnicef in the past, so will go ahead and treat him for the ear canals and ear pain and throat.  Did not do any cultures today.  Instructions.  No orders of the defined types were placed in this encounter.   Meds ordered  this encounter  Medications  . amphetamine-dextroamphetamine (ADDERALL) 20 MG tablet    Sig: Take 1 tablet (20 mg total) by mouth 2 (two) times daily.    Dispense:  60 tablet    Refill:  0  . cefdinir (OMNICEF) 300 MG capsule    Sig: Take 2 capsules (600 mg total) by mouth daily.    Dispense:  20 capsule    Refill:  0  . neomycin-polymyxin-hydrocortisone (CORTISPORIN) OTIC solution    Sig: Place 3 drops into both ears 4 (four) times daily.    Dispense:  10 mL    Refill:  0         Patient Instructions    Continue using the Adderall in the prescribed fashion 1 twice daily.  Return to see Dr. Clelia Croft next month if more is needed.  Omnicef 1 twice daily for infection.  There are a few people who are penicillin allergic that can have an allergy to this class of medicine also, so if you develop allergy symptoms discontinue the medication immediately and get checked if needed.  Use Cortisporin drops in your ears as directed for your canal inflammation.  Use Debrox to clean both your ears, each of  which have a small amount of wax along in the canals.  Return if further problems.   If you have lab work done today you will be contacted with your lab results within the next 2 weeks.  If you have not heard from [place] then please contact us. The fastest way to get your results is to register for My Chart.   IF you received an x-ray today, you will receive an invoice from [facility]. Please contact [facility] at [phone] with questions or concerns regarding your invoice.   IF you received labwork today, you will receive an invoice from [facility]. Please contact [facility] at [phone] with questions or concerns regarding your invoice.   Our billing staff will not be able to assist you with questions regarding bills from these companies.  You will be contacted with the lab results as soon as they are available. The fastest way to get your results is to activate your My  Chart account. Instructions are located on the last page of this paperwork. If you have not heard from [place] regarding the results in 2 weeks, please contact this office.         No follow-ups on file.   Janace Hoard, MD 03/21/2018

## 2018-03-21 NOTE — Patient Instructions (Addendum)
  Continue using the Adderall in the prescribed fashion 1 twice daily.  Return to see Dr. Clelia Croft next month if more is needed.  Omnicef 1 twice daily for infection.  There are a few people who are penicillin allergic that can have an allergy to this class of medicine also, so if you develop allergy symptoms discontinue the medication immediately and get checked if needed.  Use Cortisporin drops in your ears as directed for your canal inflammation.  Use Debrox to clean both your ears, each of which have a small amount of wax along in the canals.  Return if further problems.   If you have lab work done today you will be contacted with your lab results within the next 2 weeks.  If you have not heard from [place] then please contact us. The fastest way to get your results is to register for My Chart.   IF you received an x-ray today, you will receive an invoice from [facility]. Please contact [facility] at [phone] with questions or concerns regarding your invoice.   IF you received labwork today, you will receive an invoice from [place]. Please contact [facility] at [phone] with questions or concerns regarding your invoice.   Our billing staff will not be able to assist you with questions regarding bills from these companies.  You will be contacted with the lab results as soon as they are available. The fastest way to get your results is to activate your My Chart account. Instructions are located on the last page of this paperwork. If you have not heard from [place] regarding the results in 2 weeks, please contact this office.

## 2018-03-22 ENCOUNTER — Ambulatory Visit: Payer: BLUE CROSS/BLUE SHIELD | Admitting: Physical Therapy

## 2018-03-27 ENCOUNTER — Encounter: Payer: Self-pay | Admitting: Physical Therapy

## 2018-03-27 ENCOUNTER — Ambulatory Visit: Payer: BLUE CROSS/BLUE SHIELD | Admitting: Physical Therapy

## 2018-03-27 DIAGNOSIS — F411 Generalized anxiety disorder: Secondary | ICD-10-CM | POA: Diagnosis not present

## 2018-03-27 DIAGNOSIS — M62838 Other muscle spasm: Secondary | ICD-10-CM | POA: Diagnosis not present

## 2018-03-27 DIAGNOSIS — M25611 Stiffness of right shoulder, not elsewhere classified: Secondary | ICD-10-CM

## 2018-03-27 DIAGNOSIS — J310 Chronic rhinitis: Secondary | ICD-10-CM | POA: Diagnosis not present

## 2018-03-27 DIAGNOSIS — M6281 Muscle weakness (generalized): Secondary | ICD-10-CM

## 2018-03-27 DIAGNOSIS — J343 Hypertrophy of nasal turbinates: Secondary | ICD-10-CM | POA: Diagnosis not present

## 2018-03-27 NOTE — Therapy (Addendum)
[place], [place], [address] Phone: [phone]   Fax:  [phone]  Physical Therapy Treatment/Discharge  Patient Details  Name: Gary Potts MRN: [id number] Date of Birth: 1980/09/07 Referring Provider (PT): Dr Dorcas Mcmurray   Encounter Date: 03/27/2018  PT End of Session - 03/27/18 1714    Visit Number  4    Number of Visits  12    Date for PT Re-Evaluation  04/10/18    Authorization Type  BCBS    PT Start Time  1714    PT Stop Time  1745    PT Time Calculation (min)  31 min    Activity Tolerance  Patient limited by pain    Behavior During Therapy  [facility] for tasks assessed/performed       Past Medical History:  Diagnosis Date  . Anxiety   . Migraines   . Urticaria     Past Surgical History:  Procedure Laterality Date  . CHOLECYSTECTOMY  2014  . CYSTOURETHROSCOPY N/A 08/30/2017   Dr. Paul Dykes at [facility]  . OOPHORECTOMY Right 2005   found to be PARTIAL right oopherectomy only during 08/2017 total hyst w/ BSO  . TOTAL LAPAROSCOPIC HYSTERECTOMY WITH SALPINGECTOMY Bilateral 08/30/2017   Dr. Paul Dykes at [facility]; small 1 cm Rt ovarian reminant; no cervical, endometrial, B ovarian, or B fallopian tube pathologic diagnosis inc atypia, dysplasia, or malignancy; no need for continued pap screening    There were no vitals filed for this visit.  Subjective Assessment - 03/27/18 1715    Subjective  I fell down my stairs and caught- pulling the shoulder back and into ER.  Happened last Thursday. Not feeling so good today.     Patient Stated Goals  get the arm so it doesn't get tight/lock up on him anymore.     Currently in Pain?  Yes    Pain Score  8     Pain Location  Shoulder    Pain Orientation  Right    Pain Descriptors / Indicators  Aching;Dull    Aggravating Factors   reaching back, stiff and popping when  reaching up    Pain Relieving Factors  rest- using voltaren gel                       [facility] Adult PT Treatment/Exercise - 03/27/18 0001      Modalities   Modalities  Electrical Stimulation;Cryotherapy      Cryotherapy   Number Minutes Cryotherapy  15 Minutes   with estim   Cryotherapy Location  Shoulder   Rt   Type of Cryotherapy  Ice pack      Electrical Stimulation   Electrical Stimulation Location  Shoulder   with ice   Electrical Stimulation Action  IFC    Electrical Stimulation Parameters  15 min to tolerance    Electrical Stimulation Goals  Pain             PT Education - 03/27/18 1750    Education Details  anatomy of condition, POC    Person(s) Educated  Patient    Methods  Explanation    Comprehension  Verbalized understanding          PT Long Term Goals - 02/27/18 1257      PT LONG TERM GOAL #1   Title  I with advanced HEP to include return to gym  Time  6    Period  Weeks    Status  New    Target Date  04/10/18      PT LONG TERM GOAL #2   Title  improve FOTO =/< 36% limited    Time  6    Period  Weeks    Status  New    Target Date  04/10/18      PT LONG TERM GOAL #3   Title  improve Rt shoulder and cervical ROM to allow pt to report =/> 75% reduction in stiffness / locking sensation    Time  6    Period  Weeks    Status  New    Target Date  04/10/18      PT LONG TERM GOAL #4   Title  increase Rt shoulder and mid back strength =/> 5-/5 to allow return to resisted gym exercise    Time  6    Period  Weeks    Status  New    Target Date  04/10/18            Plan - 03/27/18 1738    Clinical Impression Statement  Pt presents to PT with significant increase in Rt shoulder pain and decrease in strength and ROM after falling down his stairs when he reached into extension + ER to grab rail. Active flexion just above 90 deg with pain, AC joint pain upon palpation and gentle mobilization from mid clavicle. Biceps strength  3+/5 with severe pain and TTP at proximal biceps tendon. Reports popping with movement since fall. At this time we will continue PT to try to control pain and contintinue to challenge ROM as tolerated to treat adhesive capsulitis but I strongy recommend that pt be referred to ortho for evaluation of shoulder.     PT Treatment/Interventions  Neuromuscular re-education;Dry needling;Manual techniques;Moist Heat;Ultrasound;Patient/family education;Taping;Vasopneumatic Device;Cryotherapy;Lobbyist;Therapeutic exercise;Passive range of motion    PT Next Visit Plan  ROM as tolerated, periscap strengthening    PT Home Exercise Plan  scap retraction, first rib depression with sheet, upper trap stretch, rows, ER red tband (all resistance on hold 10/8)    Consulted and Agree with Plan of Care  Patient       Patient will benefit from skilled therapeutic intervention in order to improve the following deficits and impairments:  Pain, Improper body mechanics, Postural dysfunction, Increased muscle spasms, Decreased range of motion, Decreased strength, Impaired UE functional use  Visit Diagnosis: Muscle weakness (generalized)  Stiffness of right shoulder, not elsewhere classified  Other muscle spasm     Problem List Patient Active Problem List   Diagnosis Date Noted  . Breast cancer screening, high risk patient 03/19/2018  . Adhesive capsulitis of right shoulder 01/19/2018  . Generalized anxiety disorder 07/02/2017  . Attention deficit hyperactivity disorder (ADHD), combined type 07/02/2017  . Benzodiazepine dependence, continuous ([place]) 07/02/2017  . High risk medication use 07/02/2017  . Endocrine disorder 07/02/2017  . Chronic urticaria 01/23/2017  . 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 Reactive airway disease 12/30/2016  . Other allergic rhinitis 12/30/2016  . Dermographia 12/30/2016   [place] Gipe C. Emine Lopata PT, DPT 03/27/18 5:50  PM   [facility] [address] [place], [place], [address] Phone: [phone]   Fax:  [phone]  Name: Gary Potts MRN: [id number] Date of Birth: 1981-03-30  PHYSICAL THERAPY DISCHARGE SUMMARY  Visits from Start of Care: 4  Current functional level related to  goals / functional outcomes: See above   Remaining deficits: See above   Education / Equipment: Anatomy of condition, POC, HEP, exercise form/rationale  Plan: Patient agrees to discharge.  Patient goals were not met. Patient is being discharged due to not returning since the last visit.  ?????     Lilibeth Opie C. Benny Deutschman PT, DPT 05/03/18 9:28 AM

## 2018-03-28 ENCOUNTER — Ambulatory Visit (HOSPITAL_COMMUNITY): Admission: RE | Admit: 2018-03-28 | Payer: BLUE CROSS/BLUE SHIELD | Source: Ambulatory Visit

## 2018-03-29 DIAGNOSIS — F411 Generalized anxiety disorder: Secondary | ICD-10-CM | POA: Diagnosis not present

## 2018-03-31 ENCOUNTER — Ambulatory Visit (HOSPITAL_COMMUNITY)
Admission: RE | Admit: 2018-03-31 | Discharge: 2018-03-31 | Disposition: A | Discharge status: Home / Self Care | Payer: BLUE CROSS/BLUE SHIELD | Source: Ambulatory Visit | Attending: Hematology and Oncology | Admitting: Hematology and Oncology

## 2018-03-31 DIAGNOSIS — K769 Liver disease, unspecified: Secondary | ICD-10-CM | POA: Insufficient documentation

## 2018-03-31 DIAGNOSIS — Z1231 Encounter for screening mammogram for malignant neoplasm of breast: Secondary | ICD-10-CM

## 2018-03-31 DIAGNOSIS — Z1501 Genetic susceptibility to malignant neoplasm of breast: Secondary | ICD-10-CM | POA: Diagnosis not present

## 2018-03-31 MED ORDER — GADOBUTROL 1 MMOL/ML IV SOLN
7.5000 mL | Freq: Once | INTRAVENOUS | Status: AC | PRN
  Administered 2018-03-31: 7.5 mL via INTRAVENOUS

## 2018-04-02 ENCOUNTER — Other Ambulatory Visit: Payer: Self-pay | Admitting: Hematology and Oncology

## 2018-04-02 DIAGNOSIS — K769 Liver disease, unspecified: Secondary | ICD-10-CM

## 2018-04-02 NOTE — Progress Notes (Signed)
I discussed the MRI breast with the patient and informed them that there was no evidence of cancer.  However it picked up at 2.3 cm abnormality in the liver for which a liver MRI was recommended. Patient is agreeable to undergo the liver MRI.  I placed those orders.

## 2018-04-03 ENCOUNTER — Ambulatory Visit: Payer: BLUE CROSS/BLUE SHIELD | Admitting: Physical Therapy

## 2018-04-03 DIAGNOSIS — K921 Melena: Secondary | ICD-10-CM | POA: Diagnosis not present

## 2018-04-03 DIAGNOSIS — Z800 Family history of malignant neoplasm of digestive organs: Secondary | ICD-10-CM | POA: Diagnosis not present

## 2018-04-03 DIAGNOSIS — K641 Second degree hemorrhoids: Secondary | ICD-10-CM | POA: Diagnosis not present

## 2018-04-04 ENCOUNTER — Telehealth: Payer: Self-pay | Admitting: Physical Therapy

## 2018-04-04 NOTE — Telephone Encounter (Signed)
Left message regarding No Show yesterday for PT. Advised of next appointment time and requested call back if he needs to reschedule. Soumya Colson C. Jessaca Philippi PT, DPT 04/04/18 2:30 PM

## 2018-04-05 ENCOUNTER — Telehealth: Payer: Self-pay | Admitting: Family Medicine

## 2018-04-05 ENCOUNTER — Ambulatory Visit: Payer: BLUE CROSS/BLUE SHIELD | Admitting: Physical Therapy

## 2018-04-05 DIAGNOSIS — F411 Generalized anxiety disorder: Secondary | ICD-10-CM | POA: Diagnosis not present

## 2018-04-05 NOTE — Telephone Encounter (Signed)
Patient called back to reschedule appointment but since schedule is full, no available appointment before the 8th  he would like to get a call back. He stated that he will be going in for surgery after the eight and need his medication before he has surgery. Patient stated that after surgery he will be laid up for about a month. Please advise Ph# [phone]

## 2018-04-05 NOTE — Telephone Encounter (Signed)
Left a VM in regards to his appt on 04/27/2018. The provider is leaving early and will need to be rescheduled.

## 2018-04-05 NOTE — Telephone Encounter (Signed)
Rescheduled with Dr. Leretha Pol on 04/20/2018

## 2018-04-09 ENCOUNTER — Telehealth: Payer: Self-pay | Admitting: Physical Therapy

## 2018-04-09 ENCOUNTER — Encounter: Payer: BLUE CROSS/BLUE SHIELD | Admitting: Physical Therapy

## 2018-04-09 NOTE — Telephone Encounter (Signed)
Left message regarding no show. Advised that this is the last scheduled visit & requested call back to reschedule or d/c. Aitan Rossbach C. Reghan Thul PT, DPT 04/09/18 3:03 PM

## 2018-04-10 DIAGNOSIS — F411 Generalized anxiety disorder: Secondary | ICD-10-CM | POA: Diagnosis not present

## 2018-04-12 DIAGNOSIS — F411 Generalized anxiety disorder: Secondary | ICD-10-CM | POA: Diagnosis not present

## 2018-04-13 ENCOUNTER — Ambulatory Visit (INDEPENDENT_AMBULATORY_CARE_PROVIDER_SITE_OTHER): Payer: BLUE CROSS/BLUE SHIELD | Admitting: Physician Assistant

## 2018-04-13 ENCOUNTER — Telehealth: Payer: Self-pay | Admitting: Family Medicine

## 2018-04-13 ENCOUNTER — Ambulatory Visit: Payer: BLUE CROSS/BLUE SHIELD | Admitting: Family Medicine

## 2018-04-13 DIAGNOSIS — J4520 Mild intermittent asthma, uncomplicated: Secondary | ICD-10-CM

## 2018-04-13 DIAGNOSIS — H60393 Other infective otitis externa, bilateral: Secondary | ICD-10-CM

## 2018-04-13 DIAGNOSIS — J3089 Other allergic rhinitis: Secondary | ICD-10-CM

## 2018-04-13 DIAGNOSIS — L508 Other urticaria: Secondary | ICD-10-CM

## 2018-04-13 DIAGNOSIS — K30 Functional dyspepsia: Secondary | ICD-10-CM | POA: Diagnosis not present

## 2018-04-13 DIAGNOSIS — L503 Dermatographic urticaria: Secondary | ICD-10-CM

## 2018-04-13 DIAGNOSIS — J029 Acute pharyngitis, unspecified: Secondary | ICD-10-CM

## 2018-04-13 DIAGNOSIS — J069 Acute upper respiratory infection, unspecified: Secondary | ICD-10-CM

## 2018-04-13 DIAGNOSIS — F909 Attention-deficit hyperactivity disorder, unspecified type: Secondary | ICD-10-CM

## 2018-04-13 DIAGNOSIS — T7800XS Anaphylactic reaction due to unspecified food, sequela: Secondary | ICD-10-CM

## 2018-04-13 DIAGNOSIS — J0121 Acute recurrent ethmoidal sinusitis: Secondary | ICD-10-CM

## 2018-04-13 MED ORDER — BUDESONIDE-FORMOTEROL FUMARATE 80-4.5 MCG/ACT IN AERO: 2.0000 | INHALATION_SPRAY | Freq: Two times a day (BID) | RESPIRATORY_TRACT | 3 refills | Status: AC

## 2018-04-13 MED ORDER — CLARITHROMYCIN 125 MG/5ML PO SUSR: 250.0000 mg | Freq: Two times a day (BID) | ORAL | 0 refills | Status: DC

## 2018-04-13 MED ORDER — AMPHETAMINE-DEXTROAMPHETAMINE 20 MG PO TABS: 20.0000 mg | ORAL_TABLET | Freq: Two times a day (BID) | ORAL | 0 refills | Status: DC

## 2018-04-13 MED ORDER — CLONAZEPAM 0.5 MG PO TABS: 0.5000 mg | ORAL_TABLET | Freq: Two times a day (BID) | ORAL | 1 refills | Status: DC | PRN

## 2018-04-13 MED ORDER — METHYLPREDNISOLONE ACETATE 80 MG/ML IJ SUSP
120.0000 mg | Freq: Once | INTRAMUSCULAR | Status: AC
  Administered 2018-04-13: 120 mg via INTRAMUSCULAR

## 2018-04-13 MED ORDER — TRIAMCINOLONE ACETONIDE 55 MCG/ACT NA AERO: 2.0000 | INHALATION_SPRAY | Freq: Every day | NASAL | 12 refills | Status: DC

## 2018-04-13 MED ORDER — TESTOSTERONE CYPIONATE 200 MG/ML IM SOLN: 60.0000 mg | INTRAMUSCULAR | 1 refills | Status: DC

## 2018-04-13 NOTE — Telephone Encounter (Signed)
Dr. Burnard Leigh - was supposed to see today  Dr. Conley Rolls found Hiatal hernia and did upper endoscopy but thought ppi should cover that so should be not an issue. Had colonoscopy last wk - was normal.  Had GI follow through today which showed GASTROPARESIS.  When he gets constipated it causes HAs, sweats, make him feel horrible - GI told him it is IBS - has been back up on fiber which helps some.  When he was on all the antibiotics - like doxycycline - a year ago it started this - has to drink coffee in the morning to stimulate.  If he can make himself go, then the headaches.  When he had an upper endoscopy - there was still a lot of food in the stomach and esophagus from dinner the night before  Dr. Bruna Potter is going to cut out his turbinates as causing congestion but doesn't explain intermittent fevers, ear infections, swollen lymph nodes this week, and now developed vertigo getting dizzy for about the past 2-3 wks w/ random vertigo that last for about   Has been known to have bad migraines - have been intermitent - might go a year without them but then they hit again and they are getting much worse.  For the past 2 weeks he has been getting them daily for the past 2 weeks - start bitemporal and radiates back to neck. Used to be on imitrex which was horrible - it made him sleep all the time   Thinks she needs to see an immunologist. Is on Xolair for hives and asthma - which was diagnosed after a PFT at Dr. Patricia Nettle - has a dry cough for a while which he gets when all of the sinus and URI sxs act up.  Was on hydroxyzine which had to be stopped.  But after he stopped the doxycycline, then the hives went away - so  Dr. Patricia Nettle is retiring - he thinks its allergies but has allergy testing   Dr. Bruna Potter  Has had cyst on his eye cut off - liver, ovarian  Hopper put him on cedifinir capsules and then   When he want to [facility] he was put on doxycycline for a year - this is his first year that he has  been off of it.  Is taking probiotics - eating a lot of yogurt, drinking kombuchas daily trying to help.  Gets HAs early in the morning or later in the day.  Hasn't been to the eye doctor in the past year.   Has top surgery approvied on 11/19 - Dr. Deatra Canter  Dr. Bruna Potter is going to trim turbinates - should only take 10 minutes but was going to put him under for that.

## 2018-04-13 NOTE — Addendum Note (Signed)
Addended by: Sherren Mocha on: 04/13/2018 04:05 PM   Modules accepted: Orders

## 2018-04-19 DIAGNOSIS — K5900 Constipation, unspecified: Secondary | ICD-10-CM | POA: Diagnosis not present

## 2018-04-19 DIAGNOSIS — F640 Transsexualism: Secondary | ICD-10-CM | POA: Diagnosis not present

## 2018-04-19 DIAGNOSIS — K3184 Gastroparesis: Secondary | ICD-10-CM | POA: Diagnosis not present

## 2018-04-19 DIAGNOSIS — F411 Generalized anxiety disorder: Secondary | ICD-10-CM | POA: Diagnosis not present

## 2018-04-19 DIAGNOSIS — K219 Gastro-esophageal reflux disease without esophagitis: Secondary | ICD-10-CM | POA: Diagnosis not present

## 2018-04-20 ENCOUNTER — Ambulatory Visit: Payer: BLUE CROSS/BLUE SHIELD | Admitting: Family Medicine

## 2018-04-24 ENCOUNTER — Telehealth: Payer: Self-pay | Admitting: Hematology and Oncology

## 2018-04-24 ENCOUNTER — Ambulatory Visit (HOSPITAL_COMMUNITY)
Admission: RE | Admit: 2018-04-24 | Discharge: 2018-04-24 | Disposition: A | Discharge status: Home / Self Care | Payer: BLUE CROSS/BLUE SHIELD | Source: Ambulatory Visit | Attending: Hematology and Oncology | Admitting: Hematology and Oncology

## 2018-04-24 DIAGNOSIS — K769 Liver disease, unspecified: Secondary | ICD-10-CM | POA: Diagnosis not present

## 2018-04-24 DIAGNOSIS — K7689 Other specified diseases of liver: Secondary | ICD-10-CM | POA: Diagnosis not present

## 2018-04-24 DIAGNOSIS — F411 Generalized anxiety disorder: Secondary | ICD-10-CM | POA: Diagnosis not present

## 2018-04-24 MED ORDER — GADOBUTROL 1 MMOL/ML IV SOLN
7.5000 mL | Freq: Once | INTRAVENOUS | Status: AC | PRN
  Administered 2018-04-24: 7 mL via INTRAVENOUS

## 2018-04-24 NOTE — Telephone Encounter (Signed)
I left a message on the patient's cell phone that the liver MRI was normal and the findings are related to cysts.

## 2018-04-25 DIAGNOSIS — F411 Generalized anxiety disorder: Secondary | ICD-10-CM | POA: Diagnosis not present

## 2018-04-27 ENCOUNTER — Ambulatory Visit
Admission: RE | Admit: 2018-04-27 | Discharge: 2018-04-27 | Disposition: A | Discharge status: Home / Self Care | Payer: BLUE CROSS/BLUE SHIELD | Source: Ambulatory Visit | Attending: Family Medicine | Admitting: Family Medicine

## 2018-04-27 ENCOUNTER — Ambulatory Visit (INDEPENDENT_AMBULATORY_CARE_PROVIDER_SITE_OTHER): Payer: BLUE CROSS/BLUE SHIELD | Admitting: Family Medicine

## 2018-04-27 ENCOUNTER — Ambulatory Visit: Payer: BLUE CROSS/BLUE SHIELD | Admitting: Family Medicine

## 2018-04-27 ENCOUNTER — Encounter: Payer: Self-pay | Admitting: Family Medicine

## 2018-04-27 VITALS — BP 110/80 | Ht 64.0 in | Wt 165.0 lb

## 2018-04-27 DIAGNOSIS — M25511 Pain in right shoulder: Secondary | ICD-10-CM | POA: Diagnosis not present

## 2018-04-27 DIAGNOSIS — M7501 Adhesive capsulitis of right shoulder: Secondary | ICD-10-CM | POA: Diagnosis not present

## 2018-04-27 DIAGNOSIS — S4991XA Unspecified injury of right shoulder and upper arm, initial encounter: Secondary | ICD-10-CM | POA: Diagnosis not present

## 2018-04-27 NOTE — Patient Instructions (Signed)
-   X-rays of the Rt shoulder ordered given recent fall and restricted ROM - Will follow up this with an MRI to evaluate for rotator cuff pathology vs the need for manipulation under anesthesia - For pain, recommend Gabapentin at night. May trial Ultram for breakthrough although this is temporary until more information is gathered with an MRI

## 2018-04-27 NOTE — Progress Notes (Signed)
Gary Potts - 37 y.o. adult MRN [id number]  Date of birth: 1981/02/09   Chief complaint: R shoulder pain and stiffness  SUBJECTIVE:    History of present illness: He reports ongoing right shoulder pain and limited range of motion.  He has been treated for adhesive capsulitis with frequent cortisone injections however continues to have significant restrictions in range of motion secondary to pain.  He was seen in physical therapy and was making some improvement however he had a fall approximately 4 weeks ago and had a hyperextension injury to his right shoulder.  Immediately afterwards, his pain was exacerbated and his range of motion was significantly worse.  Physical therapy was concerned and referred back to the sports physician for further evaluation and consideration of further testing.  The patient states that his pain is an 8 out of 10 and is worse with any type of flexion or abduction movements.  He also is getting muscle spasms in his shoulder.  Denies any significant persistent numbness or tingling of the extremity.  Denies any cervical midline neck pain.  Pain does improve with relative rest and gabapentin.  It does not respond to ibuprofen or diclofenac gel.  Other symptoms: No loss of grip strength.  No elbow pain.  No ecchymoses or erythema of the extremity.  He is here today frustrated that he is not making significant improvement with all the conservative treatment modalities.   Review of systems:  As stated above   Interval past medical history, surgical history, family history, and social history obtained and are unchanged.   Pertinent positive past medical history include reactive airway disease, generalized anxiety disorder, ADHD, benzodiazepine dependence, and transgender on testosterone therapy.  He is a non-smoker.  Medications reviewed and unchanged.  Pertinent positives include Adderall, clonazepam, gabapentin, ibuprofen, and testosterone. Allergies reviewed and  unchanged.  Pertinent positives include latex, penicillin and shellfish.  OBJECTIVE:  Physical exam: Vital signs are reviewed. BP 110/80   Ht 5\' 4"  (1.626 m)   Wt 165 lb (74.8 kg)   BMI 28.32 kg/m   Gen.: Alert, oriented, appears stated age, in no apparent distress HEENT: Moist oral mucosa, extraocular muscles are intact Respiratory: Normal respirations, able to speak in full sentences Cardiac: Regular rate, distal pulses 2+ Integumentary: No rashes or skin lesions Neurologic:  Sensation is intact to light touch C5-T1 on the right.  Negative Spurling's test on the right. Gait: normal without associated limp Psych: Normal affect, mood is described as good Musculoskeletal: Inspection of the right shoulder demonstrates no acute abnormality.  He does have significant tenderness to palpation in the anterior and lateral aspects of the right shoulder.  Significant restrictions in range of motion both actively and passively to 90 degrees of forward flexion and 80 degrees of abduction.  Internal rotation to the level of the hip and external rotation to the hairline.  Patient is in exquisite pain with passive range of motion.  Strength testing is unable to be tested today secondary to pain and discomfort.  Pan positive special tests of the shoulder including positive Hawkins test.  Positive Neer's test.  Positive empty can test.  Discomfort elicited with crossarm testing.  Negative O'Brien's test.  Neurovascularly intact.    ASSESSMENT & PLAN: Adhesive capsulitis of right shoulder - x-rays ordered to evaluate shoulder after recent mechanical fall - follow up MRI ordered given duration of symptoms of adhesive capsulitis and lack of response to PT/cortisone injections - patient may need surgical consultation to consider manipulation  under anesthesia vs rotator cuff repair depending upon MRI - continue home rotator cuff exercises -Continue gabapentin at night and diclofenac gel/ibuprofen for an  anti-inflammatory   Orders Placed This Encounter  Procedures  . DG Shoulder Right    Standing Status:   Future    Standing Expiration Date:   06/28/2019    Order Specific Question:   Reason for Exam (SYMPTOM  OR DIAGNOSIS REQUIRED)    Answer:   right shoulder pain    Order Specific Question:   Is patient pregnant?    Answer:   No    Order Specific Question:   Preferred imaging location?    Answer:   [facility]    Order Specific Question:   Radiology Contrast Protocol - do NOT remove file path    Answer:   \\[email or web address]\[email or web address]\[email or web address].pdf  . MR SHOULDER RIGHT WO CONTRAST    GNF:AOZH Wt 165 / ht 5'4 / no needs / no claus / no metal removed from eyes by dr / no implants / no sx to rt shoulder  , brain, heart, eyes or ears / sw w/ pt      Standing Status:   Future    Standing Expiration Date:   06/28/2019    Order Specific Question:   ** REASON FOR EXAM (FREE TEXT)    Answer:   rule out rotator cuff tear    Order Specific Question:   What is the patient's sedation requirement?    Answer:   No Sedation    Order Specific Question:   Does the patient have a pacemaker or implanted devices?    Answer:   No    Order Specific Question:   Preferred imaging location?    Answer:   [facility] (table limit-550lbs)    Order Specific Question:   Radiology Contrast Protocol - do NOT remove file path    Answer:   \\[email or web address]\[email or web address]\[email or web address]     Gustavus Messing, DO Sports Medicine Fellow [facility]

## 2018-04-27 NOTE — Progress Notes (Signed)
[facility]: Attending Note: I have reviewed the chart, discussed wit the Sports Medicine Fellow. I agree with assessment and treatment plan as detailed in the Fellow's note. Had actually significantly improved with PT, then had fall downthe stairs where he had abduction type injury. Seems to have almost a [place]

## 2018-04-27 NOTE — Assessment & Plan Note (Addendum)
-   x-rays ordered to evaluate shoulder after recent mechanical fall - follow up MRI ordered given duration of symptoms of adhesive capsulitis and lack of response to PT/cortisone injections - patient may need surgical consultation to consider manipulation under anesthesia vs rotator cuff repair depending upon MRI - continue home rotator cuff exercises -Continue gabapentin at night and diclofenac gel/ibuprofen for an anti-inflammatory

## 2018-05-01 DIAGNOSIS — F411 Generalized anxiety disorder: Secondary | ICD-10-CM | POA: Diagnosis not present

## 2018-05-03 ENCOUNTER — Ambulatory Visit
Admission: RE | Admit: 2018-05-03 | Discharge: 2018-05-03 | Disposition: A | Discharge status: Home / Self Care | Payer: BLUE CROSS/BLUE SHIELD | Source: Ambulatory Visit | Attending: Family Medicine | Admitting: Family Medicine

## 2018-05-03 DIAGNOSIS — M25511 Pain in right shoulder: Secondary | ICD-10-CM

## 2018-05-03 DIAGNOSIS — F411 Generalized anxiety disorder: Secondary | ICD-10-CM | POA: Diagnosis not present

## 2018-05-07 ENCOUNTER — Telehealth: Payer: Self-pay | Admitting: Family Medicine

## 2018-05-07 DIAGNOSIS — M25511 Pain in right shoulder: Secondary | ICD-10-CM

## 2018-05-07 NOTE — Telephone Encounter (Signed)
Gary LangtonMegan Jourdon has a small partial thickness tear in his rotator cuff (supraspinatus bursal surface). I would send him to Dr. Dion SaucierLandau so they can decide if he wants to fix it Please let him know and [facility] Denny LevySara Neal

## 2018-05-08 DIAGNOSIS — F641 Gender identity disorder in adolescence and adulthood: Secondary | ICD-10-CM | POA: Diagnosis not present

## 2018-05-08 DIAGNOSIS — F419 Anxiety disorder, unspecified: Secondary | ICD-10-CM | POA: Diagnosis not present

## 2018-05-14 NOTE — Telephone Encounter (Signed)
[facility]ucier[facility]au. Appt scheduled for 05/16/18 @ 3:15 pm.

## 2018-05-15 ENCOUNTER — Other Ambulatory Visit: Payer: Self-pay | Admitting: Family Medicine

## 2018-05-15 MED ORDER — GABAPENTIN 300 MG/6ML PO SOLN: 6.0000 mL | Freq: Every day | ORAL | 2 refills | Status: DC

## 2018-05-15 NOTE — Telephone Encounter (Signed)
Requested Prescriptions  Pending Prescriptions Disp Refills  . Gabapentin 300 MG/6ML SOLN 360 mL 2    Sig: Take 6-12 mLs by mouth at bedtime.     Neurology: Anticonvulsants - gabapentin Passed - 05/15/2018  1:18 PM      Passed - Valid encounter within last 12 months    Recent Outpatient Visits          1 month ago Infective otitis externa of both ears   [facility], [place], PA-C   1 month ago Infective otitis externa of both ears   [facility], Sandria Balesavid H, MD   2 months ago Acute pharyngitis, unspecified etiology   [facility], Levell JulyEva N, MD   3 months ago Chronic pansinusitis   [facility], Levell JulyEva N, MD   3 months ago Acute otitis externa of right ear, unspecified type   [facility] Gerald StabsBrittany D, PA-C      Future Appointments            In 1 week Fletcher AnonBardelas, Jose A, MD [facility]

## 2018-05-15 NOTE — Telephone Encounter (Signed)
Copied from CRM [id number]. Topic: Quick Communication - Rx Refill/Question >> May 15, 2018  9:47 AM Arlyss Gandyichardson, Tiffny Gemmer N, NT wrote: Medication: Gabapentin 300 MG/6ML SOLN   Has the patient contacted their pharmacy? Yes.   (Agent: If no, request that the patient contact the pharmacy for the refill.) (Agent: If yes, when and what did the pharmacy advise?) They sent a refill but the office sent the request back to them.   Preferred Pharmacy (with phone number or street name): [facility] [address] - [place], [place] - [id number] [phone] (Phone) [phone] (Fax)    Agent: Please be advised that RX refills may take up to 3 business days. We ask that you follow-up with your pharmacy.

## 2018-05-21 DIAGNOSIS — M25511 Pain in right shoulder: Secondary | ICD-10-CM | POA: Diagnosis not present

## 2018-05-21 DIAGNOSIS — M542 Cervicalgia: Secondary | ICD-10-CM | POA: Diagnosis not present

## 2018-05-22 ENCOUNTER — Ambulatory Visit: Payer: BLUE CROSS/BLUE SHIELD | Admitting: Pediatrics

## 2018-05-22 DIAGNOSIS — J309 Allergic rhinitis, unspecified: Secondary | ICD-10-CM

## 2018-05-25 ENCOUNTER — Ambulatory Visit: Payer: BLUE CROSS/BLUE SHIELD | Admitting: Family Medicine

## 2018-05-29 DIAGNOSIS — F411 Generalized anxiety disorder: Secondary | ICD-10-CM | POA: Diagnosis not present

## 2018-06-05 DIAGNOSIS — F411 Generalized anxiety disorder: Secondary | ICD-10-CM | POA: Diagnosis not present

## 2018-06-07 DIAGNOSIS — F411 Generalized anxiety disorder: Secondary | ICD-10-CM | POA: Diagnosis not present

## 2018-06-08 DIAGNOSIS — J343 Hypertrophy of nasal turbinates: Secondary | ICD-10-CM | POA: Diagnosis not present

## 2018-06-08 DIAGNOSIS — J3489 Other specified disorders of nose and nasal sinuses: Secondary | ICD-10-CM | POA: Diagnosis not present

## 2018-06-12 ENCOUNTER — Encounter: Payer: Self-pay | Admitting: Family Medicine

## 2018-06-12 ENCOUNTER — Other Ambulatory Visit: Payer: Self-pay

## 2018-06-12 ENCOUNTER — Ambulatory Visit (INDEPENDENT_AMBULATORY_CARE_PROVIDER_SITE_OTHER): Payer: BLUE CROSS/BLUE SHIELD | Admitting: Family Medicine

## 2018-06-12 VITALS — BP 111/75 | HR 71 | Temp 98.3°F | Ht 64.0 in | Wt 174.2 lb

## 2018-06-12 DIAGNOSIS — F411 Generalized anxiety disorder: Secondary | ICD-10-CM

## 2018-06-12 DIAGNOSIS — H9203 Otalgia, bilateral: Secondary | ICD-10-CM | POA: Diagnosis not present

## 2018-06-12 DIAGNOSIS — Z79899 Other long term (current) drug therapy: Secondary | ICD-10-CM

## 2018-06-12 DIAGNOSIS — Z7952 Long term (current) use of systemic steroids: Secondary | ICD-10-CM

## 2018-06-12 DIAGNOSIS — Z789 Other specified health status: Secondary | ICD-10-CM

## 2018-06-12 DIAGNOSIS — F640 Transsexualism: Secondary | ICD-10-CM

## 2018-06-12 DIAGNOSIS — F909 Attention-deficit hyperactivity disorder, unspecified type: Secondary | ICD-10-CM | POA: Diagnosis not present

## 2018-06-12 DIAGNOSIS — K047 Periapical abscess without sinus: Secondary | ICD-10-CM | POA: Diagnosis not present

## 2018-06-12 DIAGNOSIS — R718 Other abnormality of red blood cells: Secondary | ICD-10-CM | POA: Diagnosis not present

## 2018-06-12 MED ORDER — CLONAZEPAM 0.5 MG PO TABS: 0.5000 mg | ORAL_TABLET | Freq: Two times a day (BID) | ORAL | 0 refills | Status: DC | PRN

## 2018-06-12 MED ORDER — TESTOSTERONE CYPIONATE 200 MG/ML IM SOLN: 60.0000 mg | INTRAMUSCULAR | 1 refills | Status: DC

## 2018-06-12 MED ORDER — AMPHETAMINE-DEXTROAMPHETAMINE 20 MG PO TABS: 20.0000 mg | ORAL_TABLET | Freq: Two times a day (BID) | ORAL | 0 refills | Status: DC

## 2018-06-12 NOTE — Progress Notes (Signed)
12/24/201910:37 AM  [facility] [place] Vigil January 17, 1981, 37 y.o. adult [id number]  Chief Complaint  Patient presents with  . Labs Only    follow up, wants ears checked and nose, Just had nose surgery  . Medication Refill    klonopin, adderrall, testosterone    HPI:   Patient is a 37 y.o. 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, ADD, anxiety with long term use of benzo who presents today for   PCP Dr Clelia CroftShaw Last OV aug 2019 Next appt Jan 22nd pmp reviewed, on chronic bzd use  Bilateral turbinate reduction Dr Danice Goltzheo, dec 20th Currently on augmentin Sees ENT on Jan 2nd Having ear pressure and pain No drainage No bleeding from nose   Testosterone 0.153ml (60mg ) every week Has been on current this regime for almost 3 years Had top surgery in nov 2019  Adderrall 20mg  BID Diagnosed since primary school Helps him to keep focused, easily side tracked, doing well Going to school, law school  Has been on clonazepam for 10 years Several attempts to wean with withdrawals Takes every night, during the day as needed Also on gabapentin for anxiety and insomnia Doing well on this regime  Fall Risk  06/12/2018 03/21/2018 02/17/2018 02/08/2018 02/02/2018  Falls in the past year? 0 Yes Yes No No  Number falls in past yr: - - 1 - -  Injury with Fall? - - No - -     Depression screen [facility] 2/9 06/12/2018 03/21/2018 02/17/2018  Decreased Interest 0 0 0  Down, Depressed, Hopeless 0 0 -  PHQ - 2 Score 0 0 0  Altered sleeping - - -  Tired, decreased energy - - -  Change in appetite - - -  Feeling bad or failure about yourself  - - -  Trouble concentrating - - -  Moving slowly or fidgety/restless - - -  Suicidal thoughts - - -  PHQ-9 Score - - -  Difficult doing work/chores - - -    Allergies  Allergen Reactions  . Penicillins Anaphylaxis and Rash    Pt can tolerate amoxicillin  Has patient had a PCN reaction causing immediate rash, facial/tongue/throat swelling, SOB or  lightheadedness with hypotension: no  Has patient had a PCN reaction causing severe rash involving mucus membranes or skin necrosis: no Has patient had a PCN reaction that required hospitalization: unknown Has patient had a PCN reaction occurring within the last 10 years: no If all of the above answers are "NO", then may proceed with Cephalosporin use.   . Shellfish Allergy Anaphylaxis  . Latex Hives and Rash    Prior to Admission medications   Medication Sig Start Date End Date Taking? Authorizing Provider  amphetamine-dextroamphetamine (ADDERALL) 20 MG tablet Take 1 tablet (20 mg total) by mouth 2 (two) times daily. 04/21/18  Yes Sherren MochaShaw, Eva N, MD  amphetamine-dextroamphetamine (ADDERALL) 20 MG tablet Take 1 tablet (20 mg total) by mouth 2 (two) times daily. 05/19/18  Yes Sherren MochaShaw, Eva N, MD  Ascorbic Acid (VITAMIN C) 500 MG CHEW Chew 500 mg by mouth daily.   Yes [provider]  budesonide-formoterol (SYMBICORT) 80-4.5 MCG/ACT inhaler Inhale 2 puffs into the lungs 2 (two) times daily. 04/13/18  Yes Sherren MochaShaw, Eva N, MD  clonazePAM (KLONOPIN) 0.5 MG tablet Take 1 tablet (0.5 mg total) by mouth 2 (two) times daily as needed for anxiety. 04/13/18  Yes Sherren MochaShaw, Eva N, MD  diclofenac sodium (VOLTAREN) 1 % GEL Apply 2 g topically 2 (two) times  daily as needed. 01/19/18  Yes Nestor Ramp, MD  EPINEPHrine (EPIPEN 2-PAK) 0.3 mg/0.3 mL IJ SOAJ injection Use as directed for severe allergic reaction. 11/20/17  Yes Bardelas, Bonnita Hollow, MD  Gabapentin 300 MG/6ML SOLN Take 6-12 mLs by mouth at bedtime. 05/15/18  Yes Sherren Mocha, MD  ibuprofen (ADVIL,MOTRIN) 600 MG tablet  10/19/17  Yes [provider]  pantoprazole (PROTONIX) 40 MG tablet Take 1 tablet (40 mg total) by mouth daily. 30 minutes before a meal 02/08/18  Yes Sherren Mocha, MD  propranolol (INDERAL) 40 MG tablet Take 1 tablet (40 mg total) by mouth every 4 (four) hours as needed (palpitations, tremor, anxiety). 01/25/18  Yes Sherren Mocha, MD  testosterone  cypionate (DEPOTESTOSTERONE CYPIONATE) 200 MG/ML injection Inject 0.3 mLs (60 mg total) into the muscle once a week. 04/13/18  Yes Sherren Mocha, MD  metoCLOPramide (REGLAN) 5 MG tablet Take 5 mg by mouth 3 (three) times daily. 04/19/18 05/19/18  [provider]    Past Medical History:  Diagnosis Date  . Anxiety   . Migraines   . Urticaria     Past Surgical History:  Procedure Laterality Date  . CHOLECYSTECTOMY  2014  . CYSTOURETHROSCOPY N/A 08/30/2017   Dr. Bryn Gulling at [facility]  . OOPHORECTOMY Right 2005   found to be PARTIAL right oopherectomy only during 08/2017 total hyst w/ BSO  . TOTAL LAPAROSCOPIC HYSTERECTOMY WITH SALPINGECTOMY Bilateral 08/30/2017   Dr. Bryn Gulling at [facility]necologic Surgery; small 1 cm Rt ovarian reminant; no cervical, endometrial, B ovarian, or B fallopian tube pathologic diagnosis inc atypia, dysplasia, or malignancy; no need for continued pap screening    Social History   Tobacco Use  . Smoking status: Never Smoker  . Smokeless tobacco: Never Used  Substance Use Topics  . Alcohol use: Not Currently    Comment: rare- stop 06/20/2017    Family History  Problem Relation Age of Onset  . Allergic rhinitis Mother   . Sinusitis Mother   . Breast cancer Mother   . Sinusitis Sister   . Urticaria Sister   . Breast cancer Sister   . Ovarian cancer Maternal Grandmother   . Breast cancer Maternal Grandmother   . Angioedema Neg Hx   . Asthma Neg Hx   . Eczema Neg Hx   . Immunodeficiency Neg Hx     ROS Per hpi  OBJECTIVE:  Blood pressure 111/75, pulse 71, temperature 98.3 F (36.8 C), temperature source Oral, height 5\' 4"  (1.626 m), weight 174 lb 3.2 oz (79 kg), SpO2 96 %. Body mass index is 29.9 kg/m.   Physical Exam Vitals signs and nursing note reviewed.  Constitutional:      Appearance: He is well-developed.  HENT:     Head: Normocephalic and atraumatic.     Right  Ear: Hearing, tympanic membrane, ear canal and external ear normal.     Left Ear: Hearing, tympanic membrane, ear canal and external ear normal.     Nose:     Right Nostril: No epistaxis or occlusion.     Left Nostril: No epistaxis or occlusion.  Eyes:     Conjunctiva/sclera: Conjunctivae normal.     Pupils: Pupils are equal, round, and reactive to light.  Neck:     Musculoskeletal: Neck supple.  Cardiovascular:     Rate and Rhythm: Normal rate and regular rhythm.     Heart sounds: Normal heart sounds. No murmur. No friction  rub. No gallop.   Pulmonary:     Effort: Pulmonary effort is normal.     Breath sounds: Normal breath sounds. No wheezing or rales.  Lymphadenopathy:     Cervical: No cervical adenopathy.  Skin:    General: Skin is warm and dry.  Neurological:     Mental Status: He is alert and oriented to person, place, and time.     ASSESSMENT and PLAN   1. Ear pain, bilateral Reassuring exam. Cont abx and postop instructions.   2. High risk medication use - Comprehensive metabolic panel - CBC with Differential/Platelet - Estradiol - TestT+TestF+SHBG  3. Long term (current) use of systemic steroids - Comprehensive metabolic panel - CBC with Differential/Platelet - Estradiol - TestT+TestF+SHBG  4. Attention deficit hyperactivity disorder (ADHD), unspecified ADHD type 5. Generalized anxiety disorder 6. Male-to-male transgender person  Chronic medical conditions are stable. meds refilled. pmp reviewed  Other orders - amphetamine-dextroamphetamine (ADDERALL) 20 MG tablet; Take 1 tablet (20 mg total) by mouth 2 (two) times daily. - clonazePAM (KLONOPIN) 0.5 MG tablet; Take 1 tablet (0.5 mg total) by mouth 2 (two) times daily as needed for anxiety. - testosterone cypionate (DEPOTESTOSTERONE CYPIONATE) 200 MG/ML injection; Inject 0.3 mLs (60 mg total) into the muscle once a week.    Return for as scheduled with PCP.    Myles LippsIrma M [name], MD Primary Care at  [facility] [address] [place], [place] [address] Ph.  [phone] Fax [phone]

## 2018-06-12 NOTE — Patient Instructions (Signed)
° ° ° °  If you have lab work done today you will be contacted with your lab results within the next 2 weeks.  If you have not heard from us then please contact us. The fastest way to get your results is to register for My Chart. ° ° °IF you received an x-ray today, you will receive an invoice from [facility]. Please contact  Radiology at [phone] with questions or concerns regarding your invoice.  ° °IF you received labwork today, you will receive an invoice from [facility]. Please contact [facility] at [phone] with questions or concerns regarding your invoice.  ° °Our billing staff will not be able to assist you with questions regarding bills from these companies. ° °You will be contacted with the lab results as soon as they are available. The fastest way to get your results is to activate your My Chart account. Instructions are located on the last page of this paperwork. If you have not heard from us regarding the results in 2 weeks, please contact this office. °  ° ° ° °

## 2018-06-14 ENCOUNTER — Encounter: Payer: Self-pay | Admitting: Family Medicine

## 2018-06-14 ENCOUNTER — Telehealth: Payer: Self-pay | Admitting: Family Medicine

## 2018-06-14 ENCOUNTER — Ambulatory Visit (INDEPENDENT_AMBULATORY_CARE_PROVIDER_SITE_OTHER): Payer: BLUE CROSS/BLUE SHIELD | Admitting: Family Medicine

## 2018-06-14 ENCOUNTER — Other Ambulatory Visit: Payer: Self-pay

## 2018-06-14 VITALS — BP 110/72 | HR 71 | Temp 98.0°F | Resp 16 | Ht 64.0 in | Wt 174.0 lb

## 2018-06-14 DIAGNOSIS — J324 Chronic pansinusitis: Secondary | ICD-10-CM | POA: Diagnosis not present

## 2018-06-14 DIAGNOSIS — H9203 Otalgia, bilateral: Secondary | ICD-10-CM | POA: Diagnosis not present

## 2018-06-14 DIAGNOSIS — J3089 Other allergic rhinitis: Secondary | ICD-10-CM | POA: Diagnosis not present

## 2018-06-14 DIAGNOSIS — F411 Generalized anxiety disorder: Secondary | ICD-10-CM | POA: Diagnosis not present

## 2018-06-14 MED ORDER — METHYLPREDNISOLONE ACETATE 80 MG/ML IJ SUSP
120.0000 mg | Freq: Once | INTRAMUSCULAR | Status: AC
  Administered 2018-06-14: 120 mg via INTRAMUSCULAR

## 2018-06-14 MED ORDER — AMOXICILLIN-POT CLAVULANATE 400-57 MG/5ML PO SUSR: 10.0000 mL | Freq: Two times a day (BID) | ORAL | 0 refills | Status: DC

## 2018-06-14 MED ORDER — AMPHET-DEXTROAMPHET 3-BEAD ER 50 MG PO CP24: 1.0000 | ORAL_CAPSULE | ORAL | 0 refills | Status: DC

## 2018-06-14 NOTE — Telephone Encounter (Unsigned)
Copied from CRM [id number]. Topic: General - Other >> Jun 14, 2018  1:31 PM Uvaldo RisingMcneil, Ja-Kwan wrote: Reason for CRM: Pt stated he has a history of infections and he would like Dr. Clelia CroftShaw to call him. Pt requests call back from Dr. Clelia CroftShaw. Cb# [phone]

## 2018-06-14 NOTE — Patient Instructions (Addendum)
° ° ° °  If you have lab work done today you will be contacted with your lab results within the next 2 weeks.  If you have not heard from us then please contact us. The fastest way to get your results is to register for My Chart. ° ° °IF you received an x-ray today, you will receive an invoice from [facility]. Please contact [facility] at [phone] with questions or concerns regarding your invoice.  ° °IF you received labwork today, you will receive an invoice from [facility]. Please contact [facility] at [phone] with questions or concerns regarding your invoice.  ° °Our billing staff will not be able to assist you with questions regarding bills from these companies. ° °You will be contacted with the lab results as soon as they are available. The fastest way to get your results is to activate your My Chart account. Instructions are located on the last page of this paperwork. If you have not heard from us regarding the results in 2 weeks, please contact this office. °  ° ° ° °

## 2018-06-14 NOTE — Telephone Encounter (Signed)
Left message we need information on what he would like to talk with Dr. Clelia CroftShaw about.

## 2018-06-14 NOTE — Progress Notes (Signed)
Subjective:    Patient: Gary Potts  DOB: 1981-02-21; 37 y.o.   MRN: [id number]  Chief Complaint  Patient presents with  . Ear Pain     HPI Had surgery last Friday with turbinate surgery - was on clindamycin 3d only.  Had very bad ear pain and congestion flair up today. Lymphnodes started swelling. Chills. No n/v. No myaglias. Did get flu shot this year.   Has dull HA on right temple. Has stopped flonase as causes burnig and healing.  Clean out of sinuses sched w/ Teo on 1/2.  Had exams - waiting to get results - P/F  Adderall is fine  Symbicort is awesome.   On reglan for gastroparesis but going off next wk per GI doc - so going to try to go off as not sure why there was that diagnosis.   Edson SnowballShana thinks he needs to find a new job as in a hostile environment - work at [employer].  Has been told not to have sugar x 14d which will be rough  Propranolol works and when he takes   TNT volunteering  Has appt for allergist/immunologist in January   Gabapentin works well most of the time - 6 mL = 300mg  - some days is very fast acting.   Medical History Past Medical History:  Diagnosis Date  . Anxiety   . Migraines   . Urticaria    Past Surgical History:  Procedure Laterality Date  . CHOLECYSTECTOMY  2014  . CYSTOURETHROSCOPY N/A 08/30/2017   [facility] Louie [facility]  . OOPHORECTOMY Right 2005   found to be PARTIAL right oopherectomy only during 08/2017 total hyst w/ BSO  . TOTAL LAPAROSCOPIC HYSTERECTOMY WITH SALPINGECTOMY Bilateral 08/30/2017   [facility]le Louie [facility]; small 1 cm Rt ovarian reminant; no cervical, endometrial, B ovarian, or B fallopian tube pathologic diagnosis inc atypia, dysplasia, or malignancy; no need for continued pap screening   Current Outpatient Medications on File Prior to Visit  Medication Sig Dispense Refill  . amphetamine-dextroamphetamine (ADDERALL) 20  MG tablet Take 1 tablet (20 mg total) by mouth 2 (two) times daily. 60 tablet 0  . amphetamine-dextroamphetamine (ADDERALL) 20 MG tablet Take 1 tablet (20 mg total) by mouth 2 (two) times daily. 60 tablet 0  . Ascorbic Acid (VITAMIN C) 500 MG CHEW Chew 500 mg by mouth daily.    . budesonide-formoterol (SYMBICORT) 80-4.5 MCG/ACT inhaler Inhale 2 puffs into the lungs 2 (two) times daily. 1 Inhaler 3  . clonazePAM (KLONOPIN) 0.5 MG tablet Take 1 tablet (0.5 mg total) by mouth 2 (two) times daily as needed for anxiety. 60 tablet 0  . diclofenac sodium (VOLTAREN) 1 % GEL Apply 2 g topically 2 (two) times daily as needed. 100 g 5  . EPINEPHrine (EPIPEN 2-PAK) 0.3 mg/0.3 mL IJ SOAJ injection Use as directed for severe allergic reaction. 2 Device 1  . Gabapentin 300 MG/6ML SOLN Take 6-12 mLs by mouth at bedtime. 360 mL 2  . ibuprofen (ADVIL,MOTRIN) 600 MG tablet   1  . pantoprazole (PROTONIX) 40 MG tablet Take 1 tablet (40 mg total) by mouth daily. 30 minutes before a meal 30 tablet 1  . propranolol (INDERAL) 40 MG tablet Take 1 tablet (40 mg total) by mouth every 4 (four) hours as needed (palpitations, tremor, anxiety). 60 tablet 0  . testosterone cypionate (DEPOTESTOSTERONE CYPIONATE) 200 MG/ML injection Inject 0.3 mLs (60 mg total) into the muscle once  a week. 2 mL 1  . metoCLOPramide (REGLAN) 5 MG tablet Take 5 mg by mouth 3 (three) times daily.     No current facility-administered medications on file prior to visit.    Allergies  Allergen Reactions  . Penicillins Anaphylaxis and Rash    Pt can tolerate amoxicillin  Has patient had a PCN reaction causing immediate rash, facial/tongue/throat swelling, SOB or lightheadedness with hypotension: no  Has patient had a PCN reaction causing severe rash involving mucus membranes or skin necrosis: no Has patient had a PCN reaction that required hospitalization: unknown Has patient had a PCN reaction occurring within the last 10 years: no If all of the  above answers are "NO", then may proceed with Cephalosporin use.   . Shellfish Allergy Anaphylaxis  . Latex Hives and Rash   Family History  Problem Relation Age of Onset  . Allergic rhinitis Mother   . Sinusitis Mother   . Breast cancer Mother   . Sinusitis Sister   . Urticaria Sister   . Breast cancer Sister   . Ovarian cancer Maternal Grandmother   . Breast cancer Maternal Grandmother   . Angioedema Neg Hx   . Asthma Neg Hx   . Eczema Neg Hx   . Immunodeficiency Neg Hx    Social History   Socioeconomic History  . Marital status: Married    Spouse name: Educational psychologist  . Number of children: 0  . Years of education: Not on file  . Highest education level: Bachelor's degree (e.g., BA, AB, BS)  Occupational History  . Occupation: Electrical engineer  . Occupation: Consulting civil engineer    Comment: possible law school starting summer or fall 2019  Social Needs  . Financial resource strain: Not on file  . Food insecurity:    Worry: Not on file    Inability: Not on file  . Transportation needs:    Medical: Not on file    Non-medical: Not on file  Tobacco Use  . Smoking status: Never Smoker  . Smokeless tobacco: Never Used  Substance and Sexual Activity  . Alcohol use: Not Currently    Comment: rare- stop 06/20/2017  . Drug use: No  . Sexual activity: Yes    Partners: Female    Birth control/protection: Other-see comments, Surgical    Comment: pt is trans-male sexually involved with cis-females so no sperm involved in encounters  Lifestyle  . Physical activity:    Days per week: Not on file    Minutes per session: Not on file  . Stress: Not on file  Relationships  . Social connections:    Talks on phone: Not on file    Gets together: Not on file    Attends religious service: Not on file    Active member of club or organization: Not on file    Attends meetings of clubs or organizations: Not on file    Relationship status: Not on file  Other Topics Concern  . Not on file    Social History Narrative   Given name - "Victorino Dike Iglesia [place] - changed 06/2016.   Lives with wife [name].   Works Office manager.   Depression screen [facility] 2/9 06/14/2018 06/12/2018 03/21/2018 02/17/2018 02/08/2018  Decreased Interest 0 0 0 0 0  Down, Depressed, Hopeless 0 0 0 - 0  PHQ - 2 Score 0 0 0 0 0  Altered sleeping - - - - -  Tired, decreased energy - - - - -  Change in appetite - - - - -  Feeling bad or failure about yourself  - - - - -  Trouble concentrating - - - - -  Moving slowly or fidgety/restless - - - - -  Suicidal thoughts - - - - -  PHQ-9 Score - - - - -  Difficult doing work/chores - - - - -    ROS As noted in HPI  Objective:  BP 110/72   Pulse 71   Temp 98 F (36.7 C) (Oral)   Resp 16   Ht 5\' 4"  (1.626 m)   Wt 174 lb (78.9 kg)   SpO2 97%   BMI 29.87 kg/m  Physical Exam Constitutional:      General: He is not in acute distress.    Appearance: He is well-developed. He is ill-appearing. He is not diaphoretic.  HENT:     Head: Normocephalic and atraumatic.     Right Ear: Ear canal and external ear normal. A middle ear effusion is present. Tympanic membrane is retracted.     Left Ear: Ear canal and external ear normal. A middle ear effusion is present. Tympanic membrane is retracted.     Nose: Mucosal edema and rhinorrhea present.     Right Sinus: Maxillary sinus tenderness present.     Left Sinus: Maxillary sinus tenderness present.     Mouth/Throat:     Pharynx: Uvula midline. Posterior oropharyngeal erythema present. No oropharyngeal exudate.     Tonsils: No tonsillar abscesses.  Eyes:     General: No scleral icterus.       Right eye: No discharge.        Left eye: No discharge.     Conjunctiva/sclera: Conjunctivae normal.  Neck:     Musculoskeletal: Normal range of motion and neck supple.  Cardiovascular:     Rate and Rhythm: Normal rate and regular rhythm.     Heart sounds: Normal heart sounds.  Pulmonary:     Effort: Pulmonary effort is  normal.     Breath sounds: Normal breath sounds.  Lymphadenopathy:     Head:     Right side of head: Submandibular adenopathy present. No preauricular or posterior auricular adenopathy.     Left side of head: Submandibular adenopathy present. No preauricular or posterior auricular adenopathy.     Cervical: No cervical adenopathy.     Upper Body:     Right upper body: No supraclavicular adenopathy.     Left upper body: No supraclavicular adenopathy.  Skin:    General: Skin is warm and dry.     Findings: No erythema.  Neurological:     Mental Status: He is oriented to person, place, and time. He is lethargic.  Psychiatric:        Behavior: Behavior normal.     POC TESTING Office Visit on 06/12/2018  Component Date Value Ref Range Status  . Glucose 06/12/2018 81  65 - 99 mg/dL Final  . BUN 09/81/1914 6  6 - 20 mg/dL Final  . Creatinine, Ser 06/12/2018 0.96  0.76 - 1.27 mg/dL Final  . GFR calc non Af Amer 06/12/2018 101  >59 mL/min/1.73 Final  . GFR calc Af Amer 06/12/2018 117  >59 mL/min/1.73 Final  . BUN/Creatinine Ratio 06/12/2018 6* 9 - 20 Final  . Sodium 06/12/2018 140  134 - 144 mmol/L Final  . Potassium 06/12/2018 4.5  3.5 - 5.2 mmol/L Final  . Chloride 06/12/2018 100  96 - 106 mmol/L Final  . CO2 06/12/2018 23  20 - 29 mmol/L Final  .  Calcium 06/12/2018 9.7  8.7 - 10.2 mg/dL Final  . Total Protein 06/12/2018 6.6  6.0 - 8.5 g/dL Final  . Albumin 34/74/259512/24/2019 4.3  3.5 - 5.5 g/dL Final  . Globulin, Total 06/12/2018 2.3  1.5 - 4.5 g/dL Final  . Albumin/Globulin Ratio 06/12/2018 1.9  1.2 - 2.2 Final  . Bilirubin Total 06/12/2018 0.3  0.0 - 1.2 mg/dL Final  . Alkaline Phosphatase 06/12/2018 101  39 - 117 IU/L Final  . AST 06/12/2018 21  0 - 40 IU/L Final  . ALT 06/12/2018 13  0 - 44 IU/L Final  . WBC 06/12/2018 6.5  3.4 - 10.8 x10E3/uL Final  . RBC 06/12/2018 5.38  4.14 - 5.80 x10E6/uL Final  . Hemoglobin 06/12/2018 14.3  13.0 - 17.7 g/dL Final  . Hematocrit 63/87/564312/24/2019 43.8   37.5 - 51.0 % Final  . MCV 06/12/2018 81  79 - 97 fL Final  . MCH 06/12/2018 26.6  26.6 - 33.0 pg Final  . MCHC 06/12/2018 32.6  31.5 - 35.7 g/dL Final  . RDW 32/95/188412/24/2019 15.3  12.3 - 15.4 % Final   Comment: **Effective June 25, 2018, the RDW pediatric reference**   interval will be removed and the adult reference interval   will be changing to:                             Male 11.7 - 15.4                                                      Male 11.6 - 15.4   . Platelets 06/12/2018 332  150 - 450 x10E3/uL Final  . Neutrophils 06/12/2018 65  Not Estab. % Final  . Lymphs 06/12/2018 24  Not Estab. % Final  . Monocytes 06/12/2018 6  Not Estab. % Final  . Eos 06/12/2018 3  Not Estab. % Final  . Basos 06/12/2018 1  Not Estab. % Final  . Neutrophils Absolute 06/12/2018 4.3  1.4 - 7.0 x10E3/uL Final  . Lymphocytes Absolute 06/12/2018 1.6  0.7 - 3.1 x10E3/uL Final  . Monocytes Absolute 06/12/2018 0.4  0.1 - 0.9 x10E3/uL Final  . EOS (ABSOLUTE) 06/12/2018 0.2  0.0 - 0.4 x10E3/uL Final  . Basophils Absolute 06/12/2018 0.0  0.0 - 0.2 x10E3/uL Final  . Immature Granulocytes 06/12/2018 1  Not Estab. % Final  . Immature Grans (Abs) 06/12/2018 0.0  0.0 - 0.1 x10E3/uL Final  . Estradiol 06/12/2018 28.8  7.6 - 42.6 pg/mL Final   Roche ECLIA methodology  . Testosterone, Total, LC/MS 06/12/2018 706.0  264.0 - 916.0 ng/dL Final   Comment: This [facility] LC/MS-MS method is currently certified by the [employer] Hormone Standardization Program (HoSt). Adult male reference interval is based on a population of healthy nonobese males (BMI <30) between 4119 and 36 years old. Travison, et.al. JCEM [id number],102;1161-1173. PMID: 3557322028324103.   Marland Kitchen. Testosterone, Free 06/12/2018 19.7  8.7 - 25.1 pg/mL Final  . Sex Hormone Binding 06/12/2018 26.4  16.5 - 55.9 nmol/L Final     Assessment & Plan:   1. Ear pain, bilateral   2. Other allergic rhinitis   3. Chronic pansinusitis     Patient will continue on current chronic  medications other than changes noted above, so ok to refill when needed.  See after visit summary for patient specific instructions.   Meds ordered this encounter  Medications  . methylPREDNISolone acetate (DEPO-MEDROL) injection 120 mg  . amoxicillin-clavulanate (AUGMENTIN) 400-57 MG/5ML suspension    Sig: Take 10 mLs by mouth 2 (two) times daily.    Dispense:  200 mL    Refill:  0  . Amphet-Dextroamphet 3-Bead ER (MYDAYIS) 50 MG CP24    Sig: Take 1 capsule by mouth every morning.    Dispense:  30 capsule    Refill:  0    Pt will D/C adderall - switch to this    Patient verbalized to me that they understand the following: diagnosis, what is being done for them, what to expect and what should be done at home.  Their questions have been answered. They understand that I am unable to predict every possible medication interaction or adverse outcome and that if any unexpected symptoms arise, they should contact us and their pharmacist, as well as never hesitate to seek urgent/emergent care at [facility] or ER if they think it might be warranted.    Norberto Sorenson, MD, MPH Primary Care at [facility] [address] [place], [place]  [address] [phone] Office phone  [phone] Office fax  06/14/18 5:21 PM

## 2018-06-18 LAB — CBC WITH DIFFERENTIAL/PLATELET
Basophils Absolute: 0 x10E3/uL (ref 0.0–0.2)
Basos: 1 %
EOS (ABSOLUTE): 0.2 x10E3/uL (ref 0.0–0.4)
Eos: 3 %
Hematocrit: 43.8 % (ref 37.5–51.0)
Hemoglobin: 14.3 g/dL (ref 13.0–17.7)
Immature Grans (Abs): 0 x10E3/uL (ref 0.0–0.1)
Immature Granulocytes: 1 %
Lymphocytes Absolute: 1.6 x10E3/uL (ref 0.7–3.1)
Lymphs: 24 %
MCH: 26.6 pg (ref 26.6–33.0)
MCHC: 32.6 g/dL (ref 31.5–35.7)
MCV: 81 fL (ref 79–97)
Monocytes Absolute: 0.4 x10E3/uL (ref 0.1–0.9)
Monocytes: 6 %
Neutrophils Absolute: 4.3 x10E3/uL (ref 1.4–7.0)
Neutrophils: 65 %
Platelets: 332 x10E3/uL (ref 150–450)
RBC: 5.38 x10E6/uL (ref 4.14–5.80)
RDW: 15.3 % (ref 12.3–15.4)
WBC: 6.5 x10E3/uL (ref 3.4–10.8)

## 2018-06-18 LAB — TESTT+TESTF+SHBG
Sex Hormone Binding: 26.4 nmol/L (ref 16.5–55.9)
TESTOSTERONE, TOTAL, LC/MS: 706 ng/dL (ref 264.0–916.0)
Testosterone, Free: 19.7 pg/mL (ref 8.7–25.1)

## 2018-06-18 LAB — COMPREHENSIVE METABOLIC PANEL
A/G RATIO: 1.9 (ref 1.2–2.2)
ALBUMIN: 4.3 g/dL (ref 3.5–5.5)
ALK PHOS: 101 IU/L (ref 39–117)
ALT: 13 IU/L (ref 0–44)
AST: 21 IU/L (ref 0–40)
BILIRUBIN TOTAL: 0.3 mg/dL (ref 0.0–1.2)
BUN / CREAT RATIO: 6 — AB (ref 9–20)
BUN: 6 mg/dL (ref 6–20)
CO2: 23 mmol/L (ref 20–29)
Calcium: 9.7 mg/dL (ref 8.7–10.2)
Chloride: 100 mmol/L (ref 96–106)
Creatinine, Ser: 0.96 mg/dL (ref 0.76–1.27)
GFR calc Af Amer: 117 mL/min/{1.73_m2} (ref >59)
GFR calc non Af Amer: 101 mL/min/{1.73_m2} (ref >59)
GLOBULIN, TOTAL: 2.3 g/dL (ref 1.5–4.5)
Glucose: 81 mg/dL (ref 65–99)
POTASSIUM: 4.5 mmol/L (ref 3.5–5.2)
SODIUM: 140 mmol/L (ref 134–144)
Total Protein: 6.6 g/dL (ref 6.0–8.5)

## 2018-06-18 LAB — ESTRADIOL: Estradiol: 28.8 pg/mL (ref 7.6–42.6)

## 2018-06-19 DIAGNOSIS — F411 Generalized anxiety disorder: Secondary | ICD-10-CM | POA: Diagnosis not present

## 2018-06-22 DIAGNOSIS — F411 Generalized anxiety disorder: Secondary | ICD-10-CM | POA: Diagnosis not present

## 2018-06-26 DIAGNOSIS — K219 Gastro-esophageal reflux disease without esophagitis: Secondary | ICD-10-CM | POA: Diagnosis not present

## 2018-06-26 DIAGNOSIS — K3184 Gastroparesis: Secondary | ICD-10-CM | POA: Diagnosis not present

## 2018-06-26 DIAGNOSIS — K5900 Constipation, unspecified: Secondary | ICD-10-CM | POA: Diagnosis not present

## 2018-06-27 ENCOUNTER — Telehealth: Payer: Self-pay | Admitting: Family Medicine

## 2018-06-27 NOTE — Telephone Encounter (Signed)
MyChart message sent to pt about their appointment with Dr Clelia CroftShaw on 07/11/18

## 2018-06-27 NOTE — Telephone Encounter (Signed)
Copied from CRM [id number]. Topic: Quick Communication - See Telephone Encounter >> Jun 27, 2018  4:18 PM Terisa Starr wrote: CRM for notification. See Telephone encounter for: 06/27/18.  Patient states that Dr Clelia Croft was suppose to switch him back to his regular amphetamine-dextroamphetamine (ADDERALL) 20 MG tablet [[id number]]  since the Amphet-Dextroamphet 3-Bead ER (MYDAYIS) 50 MG CP24 was not in his tier for his insurance and they will not cover it. Patient has been out of this since 06/10/2018. He said he never picked up the Amphet-Dextroamphet 3-Bead ER (MYDAYIS) 50 MG CP24 when it was sent in last time. He is aware Dr Clelia Croft is out of the office but really needs the 20mg  Adderall sent back in. He currently uses [facility]. Please Advise.

## 2018-07-05 DIAGNOSIS — F411 Generalized anxiety disorder: Secondary | ICD-10-CM | POA: Diagnosis not present

## 2018-07-06 NOTE — Telephone Encounter (Signed)
Please advise 

## 2018-07-09 ENCOUNTER — Telehealth: Payer: Self-pay | Admitting: Family Medicine

## 2018-07-09 MED ORDER — TESTOSTERONE CYPIONATE 200 MG/ML IM SOLN: 60.0000 mg | INTRAMUSCULAR | 1 refills | Status: DC

## 2018-07-09 MED ORDER — CLONAZEPAM 0.5 MG PO TABS: 0.5000 mg | ORAL_TABLET | Freq: Two times a day (BID) | ORAL | 1 refills | Status: DC | PRN

## 2018-07-09 MED ORDER — AMPHETAMINE-DEXTROAMPHETAMINE 20 MG PO TABS: 20.0000 mg | ORAL_TABLET | Freq: Two times a day (BID) | ORAL | 0 refills | Status: DC

## 2018-07-09 NOTE — Telephone Encounter (Signed)
Copied from CRM [id number]. Topic: Quick Communication - Rx Refill/Question >> Jul 09, 2018  3:15 PM Burchel, Abbi R wrote: Medication: amphetamine-dextroamphetamine (ADDERALL) 20 MG tablet, clonazePAM (KLONOPIN) 0.5 MG tablet, Gabapentin 300 MG/6ML SOLN, testosterone cypionate (DEPOTESTOSTERONE CYPIONATE) 200 MG/ML injection  Preferred Pharmacy: [facility] [address] [place] - [address]  [phone] (Phone) [phone] (Fax)   Pt states he will be out of these medications by 07/22/2018, he is aware that he needs a med mgmt appt, but he will be out before 1st available with Dr Clelia Croft or Dr Leretha Pol.

## 2018-07-09 NOTE — Telephone Encounter (Signed)
MyChart message sent to pt about their appointment on 07/23/18 with Dr Shaw. °

## 2018-07-09 NOTE — Telephone Encounter (Signed)
pmp reviewed He has never filled beads Patient seen by me in dec, long term bzd use with h/o withdrawal meds refilled Has upcoming OV

## 2018-07-10 DIAGNOSIS — F411 Generalized anxiety disorder: Secondary | ICD-10-CM | POA: Diagnosis not present

## 2018-07-10 NOTE — Telephone Encounter (Signed)
I called and talked with Gary Potts. I had front desk to scheduled with Dr. Leretha Pol for 07/17/2018. Gary Potts states thank you

## 2018-07-11 ENCOUNTER — Ambulatory Visit: Payer: BLUE CROSS/BLUE SHIELD | Admitting: Family Medicine

## 2018-07-12 DIAGNOSIS — F411 Generalized anxiety disorder: Secondary | ICD-10-CM | POA: Diagnosis not present

## 2018-07-12 NOTE — Telephone Encounter (Signed)
noted 

## 2018-07-16 ENCOUNTER — Ambulatory Visit: Admit: 2018-07-16 | Discharge: 2018-07-17 | Payer: PRIVATE HEALTH INSURANCE

## 2018-07-16 DIAGNOSIS — L509 Urticaria, unspecified: Principal | ICD-10-CM

## 2018-07-16 DIAGNOSIS — J069 Acute upper respiratory infection, unspecified: Secondary | ICD-10-CM

## 2018-07-16 DIAGNOSIS — J3089 Other allergic rhinitis: Secondary | ICD-10-CM

## 2018-07-16 DIAGNOSIS — J4530 Mild persistent asthma, uncomplicated: Secondary | ICD-10-CM

## 2018-07-16 DIAGNOSIS — L508 Other urticaria: Secondary | ICD-10-CM

## 2018-07-16 DIAGNOSIS — T7800XS Anaphylactic reaction due to unspecified food, sequela: Secondary | ICD-10-CM

## 2018-07-16 DIAGNOSIS — J0121 Acute recurrent ethmoidal sinusitis: Secondary | ICD-10-CM

## 2018-07-16 MED ORDER — TRIAMCINOLONE ACETONIDE 55 MCG NASAL SPRAY AEROSOL
Freq: Every day | NASAL | 12 refills | 0.00000 days | Status: CP
Start: 2018-07-16 — End: 2018-10-08

## 2018-07-16 MED ORDER — CETIRIZINE 10 MG TABLET
ORAL_TABLET | Freq: Two times a day (BID) | ORAL | 12 refills | 0 days | Status: CP
Start: 2018-07-16 — End: ?

## 2018-07-16 MED ORDER — LEVALBUTEROL HFA 45 MCG/ACTUATION AEROSOL INHALER
RESPIRATORY_TRACT | 2 refills | 0 days | Status: CP | PRN
Start: 2018-07-16 — End: 2019-07-16

## 2018-07-16 MED ORDER — INHALATIONAL SPACING DEVICE
0 refills | 0 days | Status: CP
Start: 2018-07-16 — End: ?

## 2018-07-17 ENCOUNTER — Encounter: Payer: Self-pay | Admitting: Family Medicine

## 2018-07-17 ENCOUNTER — Ambulatory Visit: Payer: BLUE CROSS/BLUE SHIELD | Admitting: Family Medicine

## 2018-07-17 VITALS — BP 117/83 | HR 89 | Temp 97.8°F | Resp 17 | Ht 64.0 in | Wt 167.0 lb

## 2018-07-17 DIAGNOSIS — Z79899 Other long term (current) drug therapy: Secondary | ICD-10-CM

## 2018-07-17 DIAGNOSIS — Z789 Other specified health status: Secondary | ICD-10-CM

## 2018-07-17 DIAGNOSIS — F411 Generalized anxiety disorder: Secondary | ICD-10-CM

## 2018-07-17 DIAGNOSIS — F909 Attention-deficit hyperactivity disorder, unspecified type: Secondary | ICD-10-CM

## 2018-07-17 DIAGNOSIS — F640 Transsexualism: Secondary | ICD-10-CM

## 2018-07-17 MED ORDER — BUSPIRONE HCL 5 MG PO TABS: 5.0000 mg | ORAL_TABLET | Freq: Two times a day (BID) | ORAL | 0 refills | Status: DC

## 2018-07-17 MED ORDER — CLONAZEPAM 0.1 MG/ML ORAL SUSPENSION: 0.9000 mg | Freq: Every day | ORAL | 0 refills | Status: DC

## 2018-07-17 MED ORDER — AMPHETAMINE-DEXTROAMPHETAMINE 20 MG PO TABS: 20.0000 mg | ORAL_TABLET | Freq: Two times a day (BID) | ORAL | 0 refills | Status: DC

## 2018-07-17 MED ORDER — TESTOSTERONE CYPIONATE 200 MG/ML IM SOLN: 60.0000 mg | INTRAMUSCULAR | 1 refills | Status: DC

## 2018-07-17 MED ORDER — GABAPENTIN 300 MG/6ML PO SOLN: 6.0000 mL | Freq: Every day | ORAL | 2 refills | Status: DC

## 2018-07-17 NOTE — Progress Notes (Signed)
1/28/20209:19 AM  [facility] 09-Nov-1980, 38 y.o. adult [id number]  Chief Complaint  Patient presents with  . Medication Refill    ADDERALL, Gabapentin,Protoni, Testosterone, klonopin    HPI:   Patient is a 38 y.o. 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 0 0 0 Yes Yes  Number falls in past yr: - 0 - - 1  Injury with Fall? - 0 - - No     Depression screen [facility] 2/9 07/17/2018 06/14/2018 06/12/2018  Decreased Interest 0 0 0  Down, Depressed, Hopeless 0 0 0  PHQ - 2 Score 0 0 0  Altered sleeping 0 - -  Tired, decreased energy 0 - -  Change in appetite 0 - -  Feeling bad or failure about yourself  0 - -  Trouble concentrating 0 - -  Moving slowly or fidgety/restless 0 - -  Suicidal thoughts 0 - -  PHQ-9 Score 0 - -  Difficult doing work/chores Not difficult at all - -    Allergies  Allergen Reactions  . Penicillins Anaphylaxis and Rash    Pt can tolerate amoxicillin  Has patient had a PCN reaction causing immediate rash, facial/tongue/throat swelling, SOB or lightheadedness with hypotension: no  Has patient had a PCN reaction causing severe rash involving mucus membranes or skin necrosis: no Has patient had a PCN reaction that required hospitalization: unknown Has patient had a PCN  reaction occurring within the last 10 years: no If all of the above answers are "NO", then may proceed with Cephalosporin use.   . Shellfish Allergy Anaphylaxis  . Latex Hives and Rash    Prior to Admission medications   Medication Sig Start Date End Date Taking? Authorizing Provider  amphetamine-dextroamphetamine (ADDERALL) 20 MG tablet Take 1 tablet (20 mg total) by mouth 2 (two) times daily. 07/09/18  Yes Myles Lipps, MD  Ascorbic Acid (VITAMIN C) 500 MG CHEW Chew 500 mg by mouth daily.   Yes [provider]  budesonide-formoterol (SYMBICORT) 80-4.5 MCG/ACT inhaler Inhale 2 puffs into the lungs 2 (two) times daily. 04/13/18  Yes Sherren Mocha, MD  clonazePAM (KLONOPIN) 0.5 MG tablet Take 1 tablet (0.5 mg total) by mouth 2 (two) times daily as needed for anxiety. 07/09/18  Yes Myles Lipps, MD  diclofenac sodium (VOLTAREN) 1 % GEL Apply 2 g topically 2 (two) times daily as needed. 01/19/18  Yes Nestor Ramp, MD  EPINEPHrine (EPIPEN 2-PAK) 0.3 mg/0.3 mL IJ SOAJ injection Use as directed for severe allergic reaction. 11/20/17  Yes Bardelas, Bonnita Hollow, MD  Gabapentin 300 MG/6ML SOLN Take 6-12 mLs by mouth at bedtime. 05/15/18  Yes Sherren Mocha, MD  ibuprofen (ADVIL,MOTRIN) 600 MG tablet  10/19/17  Yes [provider]  pantoprazole (PROTONIX) 40 MG tablet Take  1 tablet (40 mg total) by mouth daily. 30 minutes before a meal 02/08/18  Yes Sherren MochaShaw, Eva N, MD  propranolol (INDERAL) 40 MG tablet Take 1 tablet (40 mg total) by mouth every 4 (four) hours as needed (palpitations, tremor, anxiety). 01/25/18  Yes Sherren MochaShaw, Eva N, MD  testosterone cypionate (DEPOTESTOSTERONE CYPIONATE) 200 MG/ML injection Inject 0.3 mLs (60 mg total) into the muscle once a week. 07/09/18  Yes [name], Dorethy Tomey M, MD  amoxicillin-clavulanate (AUGMENTIN) 400-57 MG/5ML suspension Take 10 mLs by mouth 2 (two) times daily. Patient not taking: Reported on 07/17/2018 06/14/18   Sherren MochaShaw, Eva N, MD  metoCLOPramide (REGLAN) 5 MG tablet  Take 5 mg by mouth 3 (three) times daily. 04/19/18 05/19/18  [provider]    Past Medical History:  Diagnosis Date  . Anxiety   . Migraines   . Urticaria     Past Surgical History:  Procedure Laterality Date  . CHOLECYSTECTOMY  2014  . CYSTOURETHROSCOPY N/A 08/30/2017   [facility] Louie [facility]  . OOPHORECTOMY Right 2005   found to be PARTIAL right oopherectomy only during 08/2017 total hyst w/ BSO  . TOTAL LAPAROSCOPIC HYSTERECTOMY WITH SALPINGECTOMY Bilateral 08/30/2017   [facility] Lou[facility]; small 1 cm Rt ovarian reminant; no cervical, endometrial, B ovarian, or B fallopian tube pathologic diagnosis inc atypia, dysplasia, or malignancy; no need for continued pap screening    Social History   Tobacco Use  . Smoking status: Never Smoker  . Smokeless tobacco: Never Used  Substance Use Topics  . Alcohol use: Not Currently    Comment: rare- stop 06/20/2017    Family History  Problem Relation Age of Onset  . Allergic rhinitis Mother   . Sinusitis Mother   . Breast cancer Mother   . Sinusitis Sister   . Urticaria Sister   . Breast cancer Sister   . Ovarian cancer Maternal Grandmother   . Breast cancer Maternal Grandmother   . Angioedema Neg Hx   . Asthma Neg Hx   . Eczema Neg Hx   . Immunodeficiency Neg Hx     ROS Per hpi  OBJECTIVE:  Blood pressure 117/83, pulse 89, temperature 97.8 F (36.6 C), temperature source Oral, resp. rate 17, height 5\' 4"  (1.626 m), weight 167 lb (75.8 kg), SpO2 98 %. Body mass index is 28.67 kg/m.   Physical Exam Vitals signs and nursing note reviewed.  Constitutional:      Appearance: She is well-developed.  HENT:     Head: Normocephalic and atraumatic.  Eyes:     Conjunctiva/sclera: Conjunctivae normal.     Pupils: Pupils are equal, round, and reactive to light.  Neck:     Musculoskeletal: Neck supple.  Pulmonary:     Effort:  Pulmonary effort is normal.  Skin:    General: Skin is warm and dry.  Neurological:     Mental Status: She is alert and oriented to person, place, and time.      ASSESSMENT and PLAN  1. High risk medication use Had long discussion re bzd, withdrawals, wean, anxiety. Will start with low dose buspirone again. Do a very gentle wean of 0.1mg  per month, changing to liquid clonazepam, as he has quickly gone into withdrawals with prior attempts.  - ToxASSURE Select 13 (MW), Urine  2. Attention deficit hyperactivity disorder (ADHD), unspecified ADHD type Stable. Cont current meds  3. Male-to-male transgender person Stable. Cont current regime  4. Generalized anxiety  disorder - clonazePAM (KLONOPIN) 0.1 mg/mL SUSP; Take 9 mLs (0.9 mg total) by mouth at bedtime.  5. Long term prescription benzodiazepine use  Other orders - busPIRone (BUSPAR) 5 MG tablet; Take 1 tablet (5 mg total) by mouth 2 (two) times daily. - amphetamine-dextroamphetamine (ADDERALL) 20 MG tablet; Take 1 tablet (20 mg total) by mouth 2 (two) times daily. - Gabapentin 300 MG/6ML SOLN; Take 6-12 mLs by mouth at bedtime. - testosterone cypionate (DEPOTESTOSTERONE CYPIONATE) 200 MG/ML injection; Inject 0.3 mLs (60 mg total) into the muscle once a week.    Return in about 3 weeks (around 08/07/2018).    Myles Lipps, MD Primary Care at [facility] [address] [place], [place] [address] Ph.  [phone] Fax [phone]

## 2018-07-17 NOTE — Patient Instructions (Signed)
° ° ° °  If you have lab work done today you will be contacted with your lab results within the next 2 weeks.  If you have not heard from us then please contact us. The fastest way to get your results is to register for My Chart. ° ° °IF you received an x-ray today, you will receive an invoice from [facility]. Please contact [facility] at [phone] with questions or concerns regarding your invoice.  ° °IF you received labwork today, you will receive an invoice from [facility]. Please contact [facility] at [phone] with questions or concerns regarding your invoice.  ° °Our billing staff will not be able to assist you with questions regarding bills from these companies. ° °You will be contacted with the lab results as soon as they are available. The fastest way to get your results is to activate your My Chart account. Instructions are located on the last page of this paperwork. If you have not heard from us regarding the results in 2 weeks, please contact this office. °  ° ° ° °

## 2018-07-19 DIAGNOSIS — F411 Generalized anxiety disorder: Secondary | ICD-10-CM | POA: Diagnosis not present

## 2018-07-23 ENCOUNTER — Ambulatory Visit: Payer: BLUE CROSS/BLUE SHIELD | Admitting: Family Medicine

## 2018-07-23 ENCOUNTER — Telehealth: Payer: Self-pay | Admitting: Family Medicine

## 2018-07-23 NOTE — Telephone Encounter (Signed)
Pt reports that [place] told him that he was likely going through withdrawal of his klonopin in the morning when he was walking up.  That his sxs were due to ADD and not anxiety.  Attributed to sxs with withdrawal rather than anxiety. Dr. Leretha Pol restarted the buspar His insurance refused to pay for the clonazepam liquid but when the liquid rx was sent in it cancelled all his prior clonazepam pills. He talked with the the pharmacist. He called his BC[employer] nurse line who told him that the clonazepam liquid and the Maydayis won't be covered (or at least until he meets his deductible.  Buspar made him meaner and sxs much worse - pharmacist didn't fill it. Pharmacist gave him a months worth of clonazepam.  Went to immunologist who is trying to discover why he is having so many infections. Immunologist told him that his T cells are a little lower than expected - wants to see him back in March when his allergies are really flared up to see what his blood counts looks like then.  Actually stable right now - only time he gets anxious is when there is a drastic uproar in his life or his medication regimen - palms get sweaty and can't think, roars in his ears, feels like he is going to loose it, lashes out.  States [place] told him that was clonazpeam w/d but he knows it is his anxiety which wans't bothering him when he was taking clonazepam 0.5mg  2 tabs po qhs (was doing that rather than 1 mg qhs as occ would take 1/2 tab or 1 tab during day when anxiety flairs and then just take 0.5-0.75mg  qhs.  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 Has always been able to breeze through things before - never had to pay attention in undergrad - but now school is getting tough.  Has always been able to maintain a full-time job while in school but now things are tight.  Agree to put pt back on  regular medication regimen. Advised that because of my impending departure, if he wants to continue on current medication regimen, his best bet is establishing with a psychiatrist - recommend Dr. Evelene Croon or Dr. Jannifer Franklin (latter is at [facility].) Pt agrees to sched appt and can then just have prescribing physician until I find new practice place.   Advised pt to sched appt with me at end of March to get 6 mos on klonopin and testosterone and 3 mos on adderall on a year on all other meds which will hopefully cover him until I establish elsewhere except for the adderall - so est w/ psych for transition on this and so he has known/est provider in case has exacerbation/change in condition and needs to be seen after March.  Pt agreeable.  Will send in refills on meds to get pt to next appt w/ me he is planning to call office to sched for ~ 2 mos.

## 2018-07-24 DIAGNOSIS — F411 Generalized anxiety disorder: Secondary | ICD-10-CM | POA: Diagnosis not present

## 2018-07-26 DIAGNOSIS — F411 Generalized anxiety disorder: Secondary | ICD-10-CM | POA: Diagnosis not present

## 2018-07-26 LAB — TOXASSURE SELECT 13 (MW), URINE

## 2018-08-02 DIAGNOSIS — F411 Generalized anxiety disorder: Secondary | ICD-10-CM | POA: Diagnosis not present

## 2018-08-05 MED ORDER — CLONAZEPAM 0.5 MG PO TABS: 0.5000 mg | ORAL_TABLET | Freq: Two times a day (BID) | ORAL | 0 refills | Status: DC | PRN

## 2018-08-05 NOTE — Telephone Encounter (Signed)
Pt never restarted the bupsar due to bad reaction prior with anger and irritability so will d/c from list.  Wants to stay on long-standing adderall 20 bid for now due to insurance issues.  Filled the rx sent in on 1/20 by Dr. Leretha Pol on 1/30.  Therefore, has rx written and sent in on 1/28 by Dr. Leretha Pol still on file at [facility] for pt to fill at end of Feb when needed ~2/27 - 2/28  Has appt sched w/ me on 3/4 for further refills on adderall.  Wants to continue on same dose of clonazepam - last rx filled 1/21 x 1 mo w/o refills. Sent in refills - pt has agreed to sched an appointment with psych for further management in the future.

## 2018-08-07 ENCOUNTER — Ambulatory Visit: Payer: BLUE CROSS/BLUE SHIELD | Admitting: Family Medicine

## 2018-08-09 DIAGNOSIS — F411 Generalized anxiety disorder: Secondary | ICD-10-CM | POA: Diagnosis not present

## 2018-08-15 ENCOUNTER — Encounter: Payer: Self-pay | Admitting: Family Medicine

## 2018-08-15 ENCOUNTER — Ambulatory Visit: Admit: 2018-08-15 | Discharge: 2018-08-16 | Payer: PRIVATE HEALTH INSURANCE

## 2018-08-15 ENCOUNTER — Encounter: Admit: 2018-08-15 | Discharge: 2018-08-16 | Payer: PRIVATE HEALTH INSURANCE

## 2018-08-15 DIAGNOSIS — J3089 Other allergic rhinitis: Principal | ICD-10-CM

## 2018-08-15 DIAGNOSIS — R591 Generalized enlarged lymph nodes: Secondary | ICD-10-CM

## 2018-08-15 DIAGNOSIS — J4530 Mild persistent asthma, uncomplicated: Secondary | ICD-10-CM

## 2018-08-15 DIAGNOSIS — A689 Relapsing fever, unspecified: Principal | ICD-10-CM

## 2018-08-15 LAB — PULMONARY FUNCTION TEST

## 2018-08-16 DIAGNOSIS — F411 Generalized anxiety disorder: Secondary | ICD-10-CM | POA: Diagnosis not present

## 2018-08-17 ENCOUNTER — Telehealth: Payer: Self-pay | Admitting: Family Medicine

## 2018-08-17 NOTE — Telephone Encounter (Signed)
c 

## 2018-08-21 DIAGNOSIS — F411 Generalized anxiety disorder: Secondary | ICD-10-CM | POA: Diagnosis not present

## 2018-08-22 ENCOUNTER — Ambulatory Visit: Payer: BLUE CROSS/BLUE SHIELD | Admitting: Family Medicine

## 2018-08-23 DIAGNOSIS — F411 Generalized anxiety disorder: Secondary | ICD-10-CM | POA: Diagnosis not present

## 2018-08-23 MED ORDER — CLONAZEPAM 0.5 MG PO TABS: 0.5000 mg | ORAL_TABLET | Freq: Two times a day (BID) | ORAL | 2 refills | Status: DC | PRN

## 2018-08-23 MED ORDER — TESTOSTERONE CYPIONATE 200 MG/ML IM SOLN: 60.0000 mg | INTRAMUSCULAR | 5 refills | Status: DC

## 2018-08-23 MED ORDER — AMPHETAMINE-DEXTROAMPHETAMINE 20 MG PO TABS: 20.0000 mg | ORAL_TABLET | Freq: Two times a day (BID) | ORAL | 0 refills | Status: DC

## 2018-08-23 NOTE — Telephone Encounter (Signed)
Pt called stating he was out of klonopin Sent over 1 mo refill on 2/16 which he never filled. Also sent in refills on adderall to bridge transition to fill on 4/1 and 5/1 as PDMP shows he filled the last adderall rx written 1/28 on 3/2.\. Has appt sched w/ me at [facility] for 4/25.  Meds ordered this encounter  Medications  . amphetamine-dextroamphetamine (ADDERALL) 20 MG tablet    Sig: Take 1 tablet (20 mg total) by mouth 2 (two) times daily.    Dispense:  60 tablet    Refill:  0  . amphetamine-dextroamphetamine (ADDERALL) 20 MG tablet    Sig: Take 1 tablet (20 mg total) by mouth 2 (two) times daily.    Dispense:  60 tablet    Refill:  0  . clonazePAM (KLONOPIN) 0.5 MG tablet    Sig: Take 1 tablet (0.5 mg total) by mouth 2 (two) times daily as needed for anxiety.    Dispense:  60 tablet    Refill:  2    Please cancel prior rx for same sent in 2/16 that was never filled.

## 2018-08-24 ENCOUNTER — Telehealth: Payer: Self-pay | Admitting: Family Medicine

## 2018-08-24 MED ORDER — DOXYCYCLINE CALCIUM 50 MG/5ML PO SYRP: ORAL_SOLUTION | ORAL | 0 refills | Status: DC

## 2018-08-24 NOTE — Telephone Encounter (Signed)
Pt called in this am reporting sxs of his recurrent sinus infection. Started several days ago with severe sore throat, swollen and tender anterior cervical and tonsilar lymph nodes, HA, sinus pressure severe around ethmoid area with severe nasal congestion and area beneath eyes and around nose are puffy and tender, also has a cough. Has had fever - currently at 101.5.  Request antibiotic due to known h/o recurrent sinus infection - is seeing allergist for this. Can only take liquids or small pills. Last had augmentin for similar 2.5 mos ago. 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  Meds ordered this encounter  Medications  . doxycycline (VIBRAMYCIN) 50 MG/5ML SYRP    Sig: Take (=200mg ) po x 1d, followed by 24mL (=100mg ) po qd x 20d    Dispense:  225 mL    Refill:  0

## 2018-08-27 ENCOUNTER — Telehealth: Payer: Self-pay | Admitting: Family Medicine

## 2018-08-27 MED ORDER — DOXYCYCLINE MONOHYDRATE 25 MG/5ML PO SUSR: ORAL | 0 refills | Status: DC

## 2018-08-27 NOTE — Telephone Encounter (Signed)
Doxy calcium 50mg /19mL needs ins pre-auth which we is not appropriate for acute illness. Changed to doxy monohhydrate 25/mg/51mL to see if ins will cover that instead.

## 2018-08-30 DIAGNOSIS — F411 Generalized anxiety disorder: Secondary | ICD-10-CM | POA: Diagnosis not present

## 2018-09-03 DIAGNOSIS — F411 Generalized anxiety disorder: Secondary | ICD-10-CM | POA: Diagnosis not present

## 2018-09-04 DIAGNOSIS — K3184 Gastroparesis: Secondary | ICD-10-CM | POA: Diagnosis not present

## 2018-09-04 DIAGNOSIS — K5900 Constipation, unspecified: Secondary | ICD-10-CM | POA: Diagnosis not present

## 2018-09-04 DIAGNOSIS — K219 Gastro-esophageal reflux disease without esophagitis: Secondary | ICD-10-CM | POA: Diagnosis not present

## 2018-09-05 ENCOUNTER — Ambulatory Visit (INDEPENDENT_AMBULATORY_CARE_PROVIDER_SITE_OTHER): Payer: BLUE CROSS/BLUE SHIELD | Admitting: Family Medicine

## 2018-09-05 ENCOUNTER — Encounter: Payer: Self-pay | Admitting: Family Medicine

## 2018-09-05 ENCOUNTER — Other Ambulatory Visit: Payer: Self-pay

## 2018-09-05 VITALS — BP 122/80 | HR 98 | Temp 97.8°F | Ht 64.0 in | Wt 158.2 lb

## 2018-09-05 DIAGNOSIS — Z113 Encounter for screening for infections with a predominantly sexual mode of transmission: Secondary | ICD-10-CM | POA: Diagnosis not present

## 2018-09-05 DIAGNOSIS — N898 Other specified noninflammatory disorders of vagina: Secondary | ICD-10-CM

## 2018-09-05 DIAGNOSIS — R300 Dysuria: Secondary | ICD-10-CM

## 2018-09-05 LAB — POCT URINALYSIS DIP (MANUAL ENTRY)
Bilirubin, UA: NEGATIVE
Blood, UA: NEGATIVE
Glucose, UA: NEGATIVE mg/dL
Ketones, POC UA: NEGATIVE mg/dL
Nitrite, UA: NEGATIVE
Protein Ur, POC: NEGATIVE mg/dL
Spec Grav, UA: 1.015 (ref 1.010–1.025)
Urobilinogen, UA: 0.2 E.U./dL
pH, UA: 7.5 (ref 5.0–8.0)

## 2018-09-05 LAB — POCT WET + KOH PREP
Trich by wet prep: ABSENT
Yeast by KOH: ABSENT
Yeast by wet prep: ABSENT

## 2018-09-05 MED ORDER — FLUCONAZOLE 150 MG PO TABS: 150.0000 mg | ORAL_TABLET | Freq: Once | ORAL | 0 refills | Status: AC

## 2018-09-05 NOTE — Progress Notes (Signed)
3/18/202010:35 AM  [facility] 1980/10/16, 38 y.o. adult [id number]  Chief Complaint  Patient presents with  . DISCHARGE    For the past 2 days having discharge, take antibiotic thinking this may be the cause.     HPI:   Patient is a 38 y.o. 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, ADD, anxiety with long term use of bzd  who presents today for vaginal discharge x 2 days.  Currently on abx for URI Discharge: smells like barely, whitish/yellowish, curd, no itchiness, has been sexually active, vaginally Having dysuria, denies hematuria Denies any pelvic pain, nausea, vomiting, fever or chills   Fall Risk  09/05/2018 07/17/2018 06/14/2018 06/12/2018 03/21/2018  Falls in the past year? 0 0 0 0 Yes  Number falls in past yr: 0 - 0 - -  Injury with Fall? 0 - 0 - -     Depression screen [facility] 2/9 09/05/2018 07/17/2018 06/14/2018  Decreased Interest 0 0 0  Down, Depressed, Hopeless 0 0 0  PHQ - 2 Score 0 0 0  Altered sleeping - 0 -  Tired, decreased energy - 0 -  Change in appetite - 0 -  Feeling bad or failure about yourself  - 0 -  Trouble concentrating - 0 -  Moving slowly or fidgety/restless - 0 -  Suicidal thoughts - 0 -  PHQ-9 Score - 0 -  Difficult doing work/chores - Not difficult at all -    Allergies  Allergen Reactions  . Penicillins Anaphylaxis and Rash    Pt can tolerate amoxicillin  Has patient had a PCN reaction causing immediate rash, facial/tongue/throat swelling, SOB or lightheadedness with hypotension: no  Has patient had a PCN reaction causing severe rash involving mucus membranes or skin necrosis: no Has patient had a PCN reaction that required hospitalization: unknown Has patient had a PCN reaction occurring within the last 10 years: no If all of the above answers are "NO", then may proceed with Cephalosporin use.   . Shellfish Allergy Anaphylaxis  . Latex Hives and Rash    Prior to Admission medications   Medication Sig Start  Date End Date Taking? Authorizing Provider  metoCLOPramide (REGLAN) 5 MG tablet Take by mouth. 04/19/18  Yes [provider]  amphetamine-dextroamphetamine (ADDERALL) 20 MG tablet Take 1 tablet (20 mg total) by mouth 2 (two) times daily. 10/19/18   Sherren Mocha, MD  amphetamine-dextroamphetamine (ADDERALL) 20 MG tablet Take 1 tablet (20 mg total) by mouth 2 (two) times daily. 09/19/18   Sherren Mocha, MD  Ascorbic Acid (VITAMIN C) 500 MG CHEW Chew 500 mg by mouth daily.    [provider]  budesonide-formoterol (SYMBICORT) 80-4.5 MCG/ACT inhaler Inhale 2 puffs into the lungs 2 (two) times daily. 04/13/18   Sherren Mocha, MD  clonazePAM (KLONOPIN) 0.5 MG tablet Take 1 tablet (0.5 mg total) by mouth 2 (two) times daily as needed for anxiety. 08/23/18   Sherren Mocha, MD  diclofenac sodium (VOLTAREN) 1 % GEL Apply 2 g topically 2 (two) times daily as needed. 01/19/18   Nestor Ramp, MD  doxycycline (VIBRAMYCIN) 25 MG/5ML SUSR Take (=200mg ) po x 1d, followed by 20mL (=100mg ) po qd x 20d 08/27/18   Sherren Mocha, MD  EPINEPHrine (EPIPEN 2-PAK) 0.3 mg/0.3 mL IJ SOAJ injection Use as directed for severe allergic reaction. 11/20/17   Fletcher Anon, MD  Gabapentin 300 MG/6ML SOLN Take 6-12 mLs by mouth at bedtime. 07/17/18  Myles Lipps, MD  ibuprofen (ADVIL,MOTRIN) 600 MG tablet  10/19/17   [provider]  pantoprazole (PROTONIX) 40 MG tablet Take 1 tablet (40 mg total) by mouth daily. 30 minutes before a meal 02/08/18   Sherren Mocha, MD  propranolol (INDERAL) 40 MG tablet Take 1 tablet (40 mg total) by mouth every 4 (four) hours as needed (palpitations, tremor, anxiety). 01/25/18   Sherren Mocha, MD  testosterone cypionate (DEPOTESTOSTERONE CYPIONATE) 200 MG/ML injection Inject 0.3 mLs (60 mg total) into the muscle once a week. 08/23/18   Sherren Mocha, MD    Past Medical History:  Diagnosis Date  . Anxiety   . Migraines   . Urticaria     Past Surgical History:  Procedure Laterality Date   . CHOLECYSTECTOMY  2014  . CYSTOURETHROSCOPY N/A 08/30/2017   Dr. Bryn Gulling at [facility]  . OOPHORECTOMY Right 2005   found to be PARTIAL right oopherectomy only during 08/2017 total hyst w/ BSO  . TOTAL LAPAROSCOPIC HYSTERECTOMY WITH SALPINGECTOMY Bilateral 08/30/2017   Dr. Bryn Gulling at [facility]nec[facility] Surgery; small 1 cm Rt ovarian reminant; no cervical, endometrial, B ovarian, or B fallopian tube pathologic diagnosis inc atypia, dysplasia, or malignancy; no need for continued pap screening    Social History   Tobacco Use  . Smoking status: Never Smoker  . Smokeless tobacco: Never Used  Substance Use Topics  . Alcohol use: Not Currently    Comment: rare- stop 06/20/2017    Family History  Problem Relation Age of Onset  . Allergic rhinitis Mother   . Sinusitis Mother   . Breast cancer Mother   . Sinusitis Sister   . Urticaria Sister   . Breast cancer Sister   . Ovarian cancer Maternal Grandmother   . Breast cancer Maternal Grandmother   . Angioedema Neg Hx   . Asthma Neg Hx   . Eczema Neg Hx   . Immunodeficiency Neg Hx     ROS Per hpi  OBJECTIVE:  Blood pressure 122/80, pulse 98, temperature 97.8 F (36.6 C), temperature source Oral, height 5\' 4"  (1.626 m), weight 158 lb 3.2 oz (71.8 kg), SpO2 98 %. Body mass index is 27.15 kg/m.   Physical Exam Gen: AAOx3, NAD   Results for orders placed or performed in visit on 09/05/18 (from the past 24 hour(s))  POCT urinalysis dipstick     Status: Abnormal   Collection Time: 09/05/18 10:31 AM  Result Value Ref Range   Color, UA yellow yellow   Clarity, UA clear clear   Glucose, UA negative negative mg/dL   Bilirubin, UA negative negative   Ketones, POC UA negative negative mg/dL   Spec Grav, UA 5.400 8.676 - 1.025   Blood, UA negative negative   pH, UA 7.5 5.0 - 8.0   Protein Ur, POC negative negative mg/dL   Urobilinogen, UA 0.2 0.2 or 1.0 E.U./dL    Nitrite, UA Negative Negative   Leukocytes, UA Small (1+) (A) Negative  POCT Wet + KOH Prep     Status: Abnormal   Collection Time: 09/05/18 10:48 AM  Result Value Ref Range   Yeast by KOH Absent Absent   Yeast by wet prep Absent Absent   WBC by wet prep Many (A) Few   Clue Cells Wet Prep HPF POC Few (A) None   Trich by wet prep Absent Absent   Bacteria Wet Prep HPF POC Few Few   Epithelial Cells By  Wet Pref ([facility]) Moderate (A) None, Few, Too numerous to count   RBC,UR,HPF,POC None None RBC/hpf    ASSESSMENT and PLAN  1. Vaginal discharge - POCT Wet + KOH Prep - unremarkable Sent rx for diflucan is discharge increased and becomes itchy. Discussed most likely physiologic  2. Dysuria - POCT urinalysis dipstick  3. Screen for STD (sexually transmitted disease) - GC/Chlamydia Probe Amp([facility]) - HIV antibody (with reflex) - RPR  Other orders - fluconazole (DIFLUCAN) 150 MG tablet; Take 1 tablet (150 mg total) by mouth once for 1 dose.  Return if symptoms worsen or fail to improve.    Myles Lipps, MD Primary Care at [facility] [address] [place], [place] [address] Ph.  [phone] Fax [phone]

## 2018-09-05 NOTE — Patient Instructions (Signed)
° ° ° °  If you have lab work done today you will be contacted with your lab results within the next 2 weeks.  If you have not heard from us then please contact us. The fastest way to get your results is to register for My Chart. ° ° °IF you received an x-ray today, you will receive an invoice from [facility]. Please contact [facility] at [phone] with questions or concerns regarding your invoice.  ° °IF you received labwork today, you will receive an invoice from [facility]. Please contact [facility] at [phone] with questions or concerns regarding your invoice.  ° °Our billing staff will not be able to assist you with questions regarding bills from these companies. ° °You will be contacted with the lab results as soon as they are available. The fastest way to get your results is to activate your My Chart account. Instructions are located on the last page of this paperwork. If you have not heard from us regarding the results in 2 weeks, please contact this office. °  ° ° ° °

## 2018-09-06 DIAGNOSIS — F411 Generalized anxiety disorder: Secondary | ICD-10-CM | POA: Diagnosis not present

## 2018-09-06 LAB — RPR: RPR Ser Ql: NONREACTIVE

## 2018-09-06 LAB — GC/CHLAMYDIA PROBE AMP
Chlamydia trachomatis, NAA: NEGATIVE
Neisseria gonorrhoeae by PCR: NEGATIVE

## 2018-09-06 LAB — HIV ANTIBODY (ROUTINE TESTING W REFLEX): HIV Screen 4th Generation wRfx: NONREACTIVE

## 2018-09-11 DIAGNOSIS — F411 Generalized anxiety disorder: Secondary | ICD-10-CM | POA: Diagnosis not present

## 2018-09-13 DIAGNOSIS — F411 Generalized anxiety disorder: Secondary | ICD-10-CM | POA: Diagnosis not present

## 2018-09-19 DIAGNOSIS — F411 Generalized anxiety disorder: Secondary | ICD-10-CM | POA: Diagnosis not present

## 2018-10-08 ENCOUNTER — Encounter: Admit: 2018-10-08 | Discharge: 2018-10-09 | Payer: PRIVATE HEALTH INSURANCE

## 2018-10-08 DIAGNOSIS — J3089 Other allergic rhinitis: Secondary | ICD-10-CM

## 2018-10-08 DIAGNOSIS — L508 Other urticaria: Secondary | ICD-10-CM

## 2018-10-08 DIAGNOSIS — Z9109 Other allergy status, other than to drugs and biological substances: Secondary | ICD-10-CM

## 2018-10-08 DIAGNOSIS — R591 Generalized enlarged lymph nodes: Secondary | ICD-10-CM

## 2018-10-08 DIAGNOSIS — A689 Relapsing fever, unspecified: Principal | ICD-10-CM

## 2018-10-08 MED ORDER — FLONASE SENSIMIST 27.5 MCG/ACTUATION NASAL SPRAY,SUSPENSION
Freq: Every day | NASAL | 0 refills | 0.00000 days | Status: CP
Start: 2018-10-08 — End: 2019-10-08

## 2018-10-08 MED ORDER — FEXOFENADINE 180 MG TABLET
ORAL_TABLET | Freq: Every day | ORAL | 12 refills | 0 days | Status: CP
Start: 2018-10-08 — End: ?

## 2018-10-12 ENCOUNTER — Telehealth (INDEPENDENT_AMBULATORY_CARE_PROVIDER_SITE_OTHER): Payer: BLUE CROSS/BLUE SHIELD | Admitting: Family Medicine

## 2018-10-12 ENCOUNTER — Other Ambulatory Visit: Payer: Self-pay

## 2018-10-12 DIAGNOSIS — F640 Transsexualism: Secondary | ICD-10-CM

## 2018-10-12 DIAGNOSIS — Z79899 Other long term (current) drug therapy: Secondary | ICD-10-CM

## 2018-10-12 DIAGNOSIS — Z789 Other specified health status: Secondary | ICD-10-CM

## 2018-10-12 DIAGNOSIS — F909 Attention-deficit hyperactivity disorder, unspecified type: Secondary | ICD-10-CM

## 2018-10-12 MED ORDER — CLONAZEPAM 0.5 MG PO TABS: ORAL_TABLET | ORAL | 0 refills | Status: AC

## 2018-10-12 MED ORDER — GABAPENTIN 300 MG/6ML PO SOLN: 300.0000 mg | Freq: Every day | ORAL | 2 refills | Status: AC

## 2018-10-12 MED ORDER — AMPHETAMINE-DEXTROAMPHETAMINE 20 MG PO TABS: 20.0000 mg | ORAL_TABLET | Freq: Two times a day (BID) | ORAL | 0 refills | Status: DC

## 2018-10-12 MED ORDER — TESTOSTERONE CYPIONATE 200 MG/ML IM SOLN: 60.0000 mg | INTRAMUSCULAR | 5 refills | Status: AC

## 2018-10-12 MED ORDER — AMPHETAMINE-DEXTROAMPHETAMINE 20 MG PO TABS: 20.0000 mg | ORAL_TABLET | Freq: Two times a day (BID) | ORAL | 0 refills | Status: AC

## 2018-10-12 NOTE — Progress Notes (Signed)
Med refill  Klonopin, testostrone, and adderall

## 2018-10-12 NOTE — Progress Notes (Signed)
Virtual Visit Note  I connected with patient on 10/12/18 at 1116am by telephone and verified that I am speaking with the correct person using two identifiers. [facility] is currently located at store and patient is currently with them during visit. The provider, Myles LippsIrma M [name], MD is located in their office at time of visit.  I discussed the limitations, risks, security and privacy concerns of performing an evaluation and management service by telephone and the availability of in person appointments. I also discussed with the patient that there may be a patient responsible charge related to this service. The patient expressed understanding and agreed to proceed.   CC: medication refills  HPI ? Patient is a 38 y.o. 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, gastroparesis, seasonal allergies asthma, ADD, anxiety with long term use of benzowho presents today for medication management  Last OV with me in Jan 2020 At that time plan was to start low dose buspar and start klonopin wean He then called with PCP Dr Clelia CroftShaw, who is longer in the practice, did not want to start buspar due to mood changes in the past, nor wean off klonopin, agreed to see psych Insurance did not pay for liquid valium, needed prior auth Patient reports that we have not been responsive  Has seen allergy/asthma - changes meds and wants to do trial of xolair Saw GI for gastroparesis, restarted reglan 10 TID  Has been stable on testosterone and adderral  pmp reviewed  Allergies  Allergen Reactions  . Penicillins Anaphylaxis and Rash    Pt can tolerate amoxicillin  Has patient had a PCN reaction causing immediate rash, facial/tongue/throat swelling, SOB or lightheadedness with hypotension: no  Has patient had a PCN reaction causing severe rash involving mucus membranes or skin necrosis: no Has patient had a PCN reaction that required hospitalization: unknown Has patient had a PCN reaction  occurring within the last 10 years: no If all of the above answers are "NO", then may proceed with Cephalosporin use.   . Shellfish Allergy Anaphylaxis  . Latex Hives and Rash    Prior to Admission medications   Medication Sig Start Date End Date Taking? Authorizing Provider  amphetamine-dextroamphetamine (ADDERALL) 20 MG tablet Take 1 tablet (20 mg total) by mouth 2 (two) times daily. 10/19/18  Yes Sherren MochaShaw, Eva N, MD  amphetamine-dextroamphetamine (ADDERALL) 20 MG tablet Take 1 tablet (20 mg total) by mouth 2 (two) times daily. 09/19/18  Yes Sherren MochaShaw, Eva N, MD  Ascorbic Acid (VITAMIN C) 500 MG CHEW Chew 500 mg by mouth daily.   Yes [provider]  budesonide-formoterol (SYMBICORT) 80-4.5 MCG/ACT inhaler Inhale 2 puffs into the lungs 2 (two) times daily. 04/13/18  Yes Sherren MochaShaw, Eva N, MD  clonazePAM (KLONOPIN) 0.5 MG tablet Take 1 tablet (0.5 mg total) by mouth 2 (two) times daily as needed for anxiety. 08/23/18  Yes Sherren MochaShaw, Eva N, MD  EPINEPHrine (EPIPEN 2-PAK) 0.3 mg/0.3 mL IJ SOAJ injection Use as directed for severe allergic reaction. 11/20/17  Yes Bardelas, Bonnita HollowJose A, MD  Gabapentin 300 MG/6ML SOLN Take 6-12 mLs by mouth at bedtime. 07/17/18  Yes [name], Cielle Aguila M, MD  ibuprofen (ADVIL,MOTRIN) 600 MG tablet  10/19/17  Yes [provider]  metoCLOPramide (REGLAN) 5 MG tablet Take 10 mg by mouth 3 (three) times daily before meals.  04/19/18  Yes [provider]  pantoprazole (PROTONIX) 40 MG tablet Take 1 tablet (40 mg total) by mouth daily. 30 minutes before a meal  02/08/18  Yes Sherren Mocha, MD  propranolol (INDERAL) 40 MG tablet Take 1 tablet (40 mg total) by mouth every 4 (four) hours as needed (palpitations, tremor, anxiety). 01/25/18  Yes Sherren Mocha, MD  testosterone cypionate (DEPOTESTOSTERONE CYPIONATE) 200 MG/ML injection Inject 0.3 mLs (60 mg total) into the muscle once a week. 08/23/18  Yes Sherren Mocha, MD    Past Medical History:  Diagnosis Date  . Anxiety   . Migraines   .  Urticaria     Past Surgical History:  Procedure Laterality Date  . CHOLECYSTECTOMY  2014  . CYSTOURETHROSCOPY N/A 08/30/2017   Dr. Bryn Gulling at [facility] Minimally In[facility] Gynecologic Surgery  . OOPHORECTOMY Right 2005   found to be PARTIAL right oopherectomy only during 08/2017 total hyst w/ BSO  . TOTAL LAPAROSCOPIC HYSTERECTOMY WITH SALPINGECTOMY Bilateral 08/30/2017   Dr. Bryn Gulling at [facility] Minimally Invasive Gynecologic Surgery; small 1 cm Rt ovarian reminant; no cervical, endometrial, B ovarian, or B fallopian tube pathologic diagnosis inc atypia, dysplasia, or malignancy; no need for continued pap screening    Social History   Tobacco Use  . Smoking status: Never Smoker  . Smokeless tobacco: Never Used  Substance Use Topics  . Alcohol use: Not Currently    Comment: rare- stop 06/20/2017    Family History  Problem Relation Age of Onset  . Allergic rhinitis Mother   . Sinusitis Mother   . Breast cancer Mother   . Sinusitis Sister   . Urticaria Sister   . Breast cancer Sister   . Ovarian cancer Maternal Grandmother   . Breast cancer Maternal Grandmother   . Angioedema Neg Hx   . Asthma Neg Hx   . Eczema Neg Hx   . Immunodeficiency Neg Hx     ROS Per hpi  Objective  Vitals as reported by the patient: none   ASSESSMENT and PLAN  1. Male-to-male transgender person Stable. Refilled meds, allows him to establish transgender care with a specialist of his choice.   2. Attention deficit hyperactivity disorder (ADHD), unspecified ADHD type Stable. Refilled meds.  3. Long term prescription benzodiazepine use 4. High risk medication use Discussed again that I would not continue prescribing, that I was willing to help wean or refer to psychiatry. Patient would like to wean. Reviewed r/se/b. RTC precautions given.   Other orders - testosterone cypionate (DEPOTESTOSTERONE CYPIONATE) 200 MG/ML injection; Inject 0.3 mLs (60 mg total) into the muscle once a week.  - amphetamine-dextroamphetamine (ADDERALL) 20 MG tablet; Take 1 tablet (20 mg total) by mouth 2 (two) times daily. - amphetamine-dextroamphetamine (ADDERALL) 20 MG tablet; Take 1 tablet (20 mg total) by mouth 2 (two) times daily. - amphetamine-dextroamphetamine (ADDERALL) 20 MG tablet; Take 1 tablet (20 mg total) by mouth 2 (two) times daily. - gabapentin (NEURONTIN) 300 MG/6ML solution; Take 6-12 mLs (300-600 mg total) by mouth at bedtime. - clonazePAM (KLONOPIN) 0.5 MG tablet; Take 0.5 tablets (0.25 mg total) by mouth every morning AND 1 tablet (0.5 mg total) at bedtime.  FOLLOW-UP: 4 weeks, bzd wean   The above assessment and management plan was discussed with the patient. The patient verbalized understanding of and has agreed to the management plan. Patient is aware to call the clinic if symptoms persist or worsen. Patient is aware when to return to the clinic for a follow-up visit. Patient educated on when it is appropriate to go to the emergency department.    I provided 24 minutes of non-face-to-face time during  this encounter.  Rutherford Guys, MD Primary Care at [place], [place] [address] Ph.  [phone] Fax [phone]

## 2018-10-31 DIAGNOSIS — K3184 Gastroparesis: Secondary | ICD-10-CM | POA: Diagnosis not present

## 2018-10-31 DIAGNOSIS — K5900 Constipation, unspecified: Secondary | ICD-10-CM | POA: Diagnosis not present

## 2018-11-21 ENCOUNTER — Encounter: Payer: Self-pay | Admitting: Family Medicine

## 2018-11-21 ENCOUNTER — Other Ambulatory Visit: Payer: Self-pay

## 2018-11-21 ENCOUNTER — Ambulatory Visit (INDEPENDENT_AMBULATORY_CARE_PROVIDER_SITE_OTHER): Payer: BC Managed Care – PPO | Admitting: Family Medicine

## 2018-11-21 VITALS — BP 120/80 | HR 77 | Temp 97.9°F | Ht 64.0 in | Wt 165.0 lb

## 2018-11-21 DIAGNOSIS — R079 Chest pain, unspecified: Secondary | ICD-10-CM

## 2018-11-21 DIAGNOSIS — Z79899 Other long term (current) drug therapy: Secondary | ICD-10-CM | POA: Diagnosis not present

## 2018-11-21 DIAGNOSIS — R222 Localized swelling, mass and lump, trunk: Secondary | ICD-10-CM

## 2018-11-21 MED ORDER — MELOXICAM 15 MG PO TABS: 15.0000 mg | ORAL_TABLET | Freq: Every day | ORAL | 0 refills | Status: DC

## 2018-11-21 NOTE — Patient Instructions (Signed)
° ° ° °  If you have lab work done today you will be contacted with your lab results within the next 2 weeks.  If you have not heard from us then please contact us. The fastest way to get your results is to register for My Chart. ° ° °IF you received an x-ray today, you will receive an invoice from [facility]. Please contact [facility] at [phone] with questions or concerns regarding your invoice.  ° °IF you received labwork today, you will receive an invoice from [facility]. Please contact [facility] at [phone] with questions or concerns regarding your invoice.  ° °Our billing staff will not be able to assist you with questions regarding bills from these companies. ° °You will be contacted with the lab results as soon as they are available. The fastest way to get your results is to activate your My Chart account. Instructions are located on the last page of this paperwork. If you have not heard from us regarding the results in 2 weeks, please contact this office. °  ° ° ° °

## 2018-11-21 NOTE — Progress Notes (Signed)
6/3/202011:21 AM  [facility] 04-25-1981, 38 y.o., adult [id number]  Chief Complaint  Patient presents with  . Chest Pain    has knot on the right side of the chest that has been causing generating pain to the right arm    HPI:   Patient is a 38 y.o. 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 0 0 0 0 0  Number falls in past yr: 0 0 0 - 0  Injury with Fall? 0 0 0 - 0  Follow up - Falls evaluation completed - - -     Depression screen [facility] 2/9 11/21/2018 10/12/2018 09/05/2018  Decreased Interest 0 0 0  Down, Depressed, Hopeless 0 0 0  PHQ - 2 Score 0 0 0  Altered sleeping - - -  Tired, decreased energy - - -  Change in appetite - - -  Feeling bad or failure about yourself  - - -  Trouble concentrating - - -  Moving slowly or fidgety/restless - - -  Suicidal thoughts - - -  PHQ-9 Score - - -  Difficult doing work/chores - - -    Allergies  Allergen Reactions  . Penicillins Anaphylaxis and Rash    Pt can tolerate amoxicillin  Has patient had a PCN reaction causing immediate rash, facial/tongue/throat swelling, SOB or lightheadedness with hypotension: no  Has patient had a PCN reaction causing severe rash involving  mucus membranes or skin necrosis: no Has patient had a PCN reaction that required hospitalization: unknown Has patient had a PCN reaction occurring within the last 10 years: no If all of the above answers are "NO", then may proceed with Cephalosporin use.   . Shellfish Allergy Anaphylaxis  . Latex Hives and Rash    Prior to Admission medications   Medication Sig Start Date End Date Taking? Authorizing Provider  amphetamine-dextroamphetamine (ADDERALL) 20 MG tablet Take 1 tablet (20 mg total) by mouth 2 (two) times daily. 10/19/18  Yes Myles Lipps, MD  amphetamine-dextroamphetamine (ADDERALL) 20 MG tablet Take 1 tablet (20 mg total) by mouth 2 (two) times daily. 10/12/18  Yes Myles Lipps, MD  amphetamine-dextroamphetamine (ADDERALL) 20 MG tablet Take 1 tablet (20 mg total) by mouth 2 (two) times daily. 10/12/18  Yes Myles Lipps, MD  Ascorbic Acid (VITAMIN C) 500 MG CHEW Chew 500 mg by mouth daily.   Yes [provider]  budesonide-formoterol (SYMBICORT) 80-4.5 MCG/ACT inhaler Inhale 2 puffs into the lungs 2 (two) times daily. 04/13/18  Yes Sherren Mocha, MD  clonazePAM (KLONOPIN) 0.5 MG tablet Take 0.5 tablets (0.25 mg total) by mouth every morning AND 1 tablet (0.5 mg total) at bedtime. 10/12/18  Yes Leretha Pol,  Meda CoffeeIrma M, MD  EPINEPHrine (EPIPEN 2-PAK) 0.3 mg/0.3 mL IJ SOAJ injection Use as directed for severe allergic reaction. 11/20/17  Yes Bardelas, Bonnita HollowJose A, MD  gabapentin (NEURONTIN) 300 MG/6ML solution Take 6-12 mLs (300-600 mg total) by mouth at bedtime. 10/12/18  Yes [name], Almarie Kurdziel M, MD  ibuprofen (ADVIL,MOTRIN) 600 MG tablet  10/19/17  Yes [provider]  metoCLOPramide (REGLAN) 5 MG tablet Take 10 mg by mouth 4 (four) times daily.  04/19/18  Yes [provider]  pantoprazole (PROTONIX) 40 MG tablet Take 1 tablet (40 mg total) by mouth daily. 30 minutes before a meal Patient taking differently: Take 40 mg by mouth 2 (two) times daily. 30 minutes before a meal  02/08/18  Yes Sherren MochaShaw, Eva N, MD  propranolol (INDERAL) 40 MG tablet Take 1 tablet (40 mg total) by mouth every 4 (four) hours as needed (palpitations, tremor, anxiety). 01/25/18  Yes Sherren MochaShaw, Eva N, MD  testosterone cypionate (DEPOTESTOSTERONE CYPIONATE) 200 MG/ML injection Inject 0.3 mLs (60 mg total) into the muscle once a week. 10/12/18  Yes [name], Adeel Guiffre M, MD    Past Medical History:  Diagnosis Date  . Anxiety   . Migraines   . Urticaria     Past Surgical History:  Procedure Laterality Date  . CHOLECYSTECTOMY  2014  . CYSTOURETHROSCOPY N/A 08/30/2017   [facility]  . OOPHORECTOMY Right 2005   found to be PARTIAL right oopherectomy only during 08/2017 total hyst w/ BSO  . TOTAL LAPAROSCOPIC HYSTERECTOMY WITH SALPINGECTOMY Bilateral 08/30/2017   [facility]; small 1 cm Rt ovarian reminant; no cervical, endometrial, B ovarian, or B fallopian tube pathologic diagnosis inc atypia, dysplasia, or malignancy; no need for continued pap screening    Social History   Tobacco Use  . Smoking status: Never Smoker  . Smokeless tobacco: Never Used  Substance Use Topics  . Alcohol use: Not Currently    Comment: rare- stop 06/20/2017    Family History  Problem Relation Age of Onset  . Allergic rhinitis Mother   . Sinusitis Mother   . Breast cancer Mother   . Sinusitis Sister   . Urticaria Sister   . Breast cancer Sister   . Ovarian cancer Maternal Grandmother   . Breast cancer Maternal Grandmother   . Angioedema Neg Hx   . Asthma Neg Hx   . Eczema Neg Hx   . Immunodeficiency Neg Hx     ROS Per hpi  OBJECTIVE:  Today's Vitals   11/21/18 1001  BP: 120/80  Pulse: 77  Temp: 97.9 F (36.6 C)  TempSrc: Oral  SpO2: 97%  Weight: 165 lb (74.8 kg)  Height: 5\' 4"  (1.626 m)   Body mass index is 28.32 kg/m.   Physical Exam Vitals signs and nursing note reviewed.   Constitutional:      Appearance: He is well-developed.  HENT:     Head: Normocephalic and atraumatic.  Eyes:     General: No scleral icterus.    Conjunctiva/sclera: Conjunctivae normal.     Pupils: Pupils are equal, round, and reactive to light.  Neck:     Musculoskeletal: Neck supple.  Pulmonary:     Effort: Pulmonary effort is normal.  Chest:     Chest wall: Mass (1 cm firm mobile nodule over ~ rib 6 costernal joint, right side) and tenderness present.     Breasts:        Right: Normal.  Skin:    General: Skin is warm and dry.  Neurological:     Mental Status: He is alert and oriented to person, place, and time.    My interpretation of EKG:  NSR, HR 68  ASSESSMENT and PLAN  1. Chest pain, unspecified type - EKG 12-Lead - [place] CHEST SOFT TISSUE; Future  2. Mass of right chest wall - [place] CHEST SOFT TISSUE; Future  3. Long term prescription benzodiazepine use Discussed weaning schedule to goal on 1/2 tab once a day  Other orders - meloxicam (MOBIC) 15 MG tablet; Take 1 tablet (15 mg total) by mouth daily.  Return for CPE.    Myles Lipps, MD Primary Care at [facility] [address] [place], [place] [address] Ph.  [phone] Fax [phone]

## 2018-11-26 ENCOUNTER — Ambulatory Visit: Payer: BLUE CROSS/BLUE SHIELD | Admitting: Family Medicine

## 2018-11-27 DIAGNOSIS — F411 Generalized anxiety disorder: Secondary | ICD-10-CM | POA: Diagnosis not present

## 2018-11-28 ENCOUNTER — Telehealth: Payer: Self-pay | Admitting: Family Medicine

## 2018-11-28 NOTE — Telephone Encounter (Signed)
Spoke with Dr. Pamella Pert nurse and she states they are taking care of this pt, they are waiting to hear back about the [place]

## 2018-11-28 NOTE — Telephone Encounter (Signed)
Copied from [place] [id number]. Topic: Quick Communication - See Telephone Encounter >> Nov 28, 2018  8:05 AM Robina Ade, Helene Kelp D wrote: CRM for notification. See Telephone encounter for: 11/28/18. Patient called and would like to talk to Dr. Pamella Pert about the knot he has in his chest that she saw him for and the pain is worse and medication did not help. He still has not heard back from ultrasound. Please call patient back, thanks.

## 2018-11-29 DIAGNOSIS — F411 Generalized anxiety disorder: Secondary | ICD-10-CM | POA: Diagnosis not present

## 2018-12-03 ENCOUNTER — Other Ambulatory Visit: Payer: Self-pay | Admitting: Family Medicine

## 2018-12-03 DIAGNOSIS — R079 Chest pain, unspecified: Secondary | ICD-10-CM

## 2018-12-03 DIAGNOSIS — R222 Localized swelling, mass and lump, trunk: Secondary | ICD-10-CM

## 2018-12-06 DIAGNOSIS — F411 Generalized anxiety disorder: Secondary | ICD-10-CM | POA: Diagnosis not present

## 2018-12-10 ENCOUNTER — Other Ambulatory Visit: Payer: BLUE CROSS/BLUE SHIELD

## 2018-12-13 DIAGNOSIS — J4540 Moderate persistent asthma, uncomplicated: Secondary | ICD-10-CM | POA: Diagnosis not present

## 2018-12-13 DIAGNOSIS — L500 Allergic urticaria: Secondary | ICD-10-CM | POA: Diagnosis not present

## 2018-12-13 DIAGNOSIS — L209 Atopic dermatitis, unspecified: Secondary | ICD-10-CM | POA: Diagnosis not present

## 2018-12-13 DIAGNOSIS — R222 Localized swelling, mass and lump, trunk: Secondary | ICD-10-CM | POA: Diagnosis not present

## 2018-12-14 ENCOUNTER — Other Ambulatory Visit: Payer: BLUE CROSS/BLUE SHIELD

## 2018-12-18 DIAGNOSIS — J0101 Acute recurrent maxillary sinusitis: Secondary | ICD-10-CM | POA: Diagnosis not present

## 2018-12-20 ENCOUNTER — Ambulatory Visit
Admission: RE | Admit: 2018-12-20 | Discharge: 2018-12-20 | Disposition: A | Discharge status: Home / Self Care | Payer: BLUE CROSS/BLUE SHIELD | Source: Ambulatory Visit | Attending: Family Medicine | Admitting: Family Medicine

## 2018-12-20 ENCOUNTER — Other Ambulatory Visit: Payer: Self-pay

## 2018-12-20 ENCOUNTER — Ambulatory Visit
Admission: RE | Admit: 2018-12-20 | Discharge: 2018-12-20 | Disposition: A | Discharge status: Home / Self Care | Payer: BC Managed Care – PPO | Source: Ambulatory Visit | Attending: Family Medicine | Admitting: Family Medicine

## 2018-12-20 DIAGNOSIS — R222 Localized swelling, mass and lump, trunk: Secondary | ICD-10-CM

## 2018-12-20 DIAGNOSIS — R922 Inconclusive mammogram: Secondary | ICD-10-CM | POA: Diagnosis not present

## 2018-12-20 DIAGNOSIS — K3184 Gastroparesis: Secondary | ICD-10-CM | POA: Diagnosis not present

## 2018-12-20 DIAGNOSIS — R079 Chest pain, unspecified: Secondary | ICD-10-CM

## 2018-12-20 DIAGNOSIS — K219 Gastro-esophageal reflux disease without esophagitis: Secondary | ICD-10-CM | POA: Diagnosis not present

## 2018-12-20 DIAGNOSIS — K5900 Constipation, unspecified: Secondary | ICD-10-CM | POA: Diagnosis not present

## 2018-12-20 DIAGNOSIS — N6312 Unspecified lump in the right breast, upper inner quadrant: Secondary | ICD-10-CM | POA: Diagnosis not present

## 2018-12-21 ENCOUNTER — Encounter: Payer: BC Managed Care – PPO | Admitting: Family Medicine

## 2018-12-31 ENCOUNTER — Other Ambulatory Visit: Payer: Self-pay | Admitting: Surgery

## 2018-12-31 DIAGNOSIS — N6310 Unspecified lump in the right breast, unspecified quadrant: Secondary | ICD-10-CM | POA: Diagnosis not present

## 2019-01-18 ENCOUNTER — Ambulatory Visit: Payer: BC Managed Care – PPO | Admitting: Family Medicine

## 2019-01-31 ENCOUNTER — Telehealth: Payer: Self-pay | Admitting: Family Medicine

## 2019-01-31 NOTE — Telephone Encounter (Signed)
clle pt LVM to call us back to reschedule CPE / per pt request FR

## 2019-02-01 ENCOUNTER — Ambulatory Visit (INDEPENDENT_AMBULATORY_CARE_PROVIDER_SITE_OTHER): Payer: BC Managed Care – PPO | Admitting: Family Medicine

## 2019-02-01 ENCOUNTER — Other Ambulatory Visit: Payer: Self-pay

## 2019-02-01 ENCOUNTER — Encounter (HOSPITAL_COMMUNITY)
Admission: RE | Admit: 2019-02-01 | Discharge: 2019-02-01 | Disposition: A | Discharge status: Home / Self Care | Payer: BC Managed Care – PPO | Source: Ambulatory Visit | Attending: Surgery | Admitting: Surgery

## 2019-02-01 ENCOUNTER — Encounter (HOSPITAL_COMMUNITY): Payer: Self-pay

## 2019-02-01 ENCOUNTER — Encounter: Payer: BC Managed Care – PPO | Admitting: Family Medicine

## 2019-02-01 VITALS — BP 104/70 | Ht 64.0 in | Wt 162.0 lb

## 2019-02-01 DIAGNOSIS — M7501 Adhesive capsulitis of right shoulder: Secondary | ICD-10-CM | POA: Diagnosis not present

## 2019-02-01 DIAGNOSIS — Z01812 Encounter for preprocedural laboratory examination: Secondary | ICD-10-CM | POA: Insufficient documentation

## 2019-02-01 LAB — HEMOGLOBIN: Hemoglobin: 15.3 g/dL (ref 13.0–17.0)

## 2019-02-01 NOTE — Pre-Procedure Instructions (Signed)
[facility]  02/[facility]/2019      [facility] [address]- [place], [place] - [address] [place] [place] [address] Phone: [phone] Fax: [phone]    Your procedure is scheduled on August 20,2020.  [facility] Admitting at 10:00 A.M.  Call this number if you have problems the morning of surgery:  [phone]   Remember:  Do not eat after midnight.  You may drink clear liquids until 9:00 AM .  Clear liquids allowed are:                    Water, Juice (non-citric and without pulp), Carbonated beverages, Clear Tea, Black Coffee only, Plain Jell-O only, Gatorade and Plain Popsicles only    Take these medicines the morning of surgery with A SIP OF WATER:  Budesonide-formoterol (Symbicort) inhaler as needed-bring with you day of surgery Clonazepam (Klonopin) Fluticasone (Flonase) if needed Metoclopramide (Reglan) Pantoprazole (Protonix)       Do not wear jewelry, make-up or nail polish.  Do not wear lotions, powders, or perfumes, or deodorant.  Do not shave 48 hours prior to surgery.  Men may shave face and neck.  Do not bring valuables to the hospital.  [facility] is not responsible for any belongings or valuables.  Contacts, dentures or bridgework may not be worn into surgery.  Leave your suitcase in the car.  After surgery it may be brought to your room.  For patients admitted to the hospital, discharge time will be determined by your treatment team.  Patients discharged the day of surgery will not be allowed to drive home.   Special instructions:    [place]- Preparing For Surgery  Before surgery, you can play an important role. Because skin is not sterile, your skin needs to be as free of germs as possible. You can reduce the number of germs on your skin by washing with CHG (chlorahexidine gluconate) Soap before surgery.  CHG is an antiseptic cleaner which kills germs and bonds with the skin to continue  killing germs even after washing.    Oral Hygiene is also important to reduce your risk of infection.  Remember - BRUSH YOUR TEETH THE MORNING OF SURGERY WITH YOUR REGULAR TOOTHPASTE  Please do not use if you have an allergy to CHG or antibacterial soaps. If your skin becomes reddened/irritated stop using the CHG.  Do not shave (including legs and underarms) for at least 48 hours prior to first CHG shower. It is OK to shave your face.  Please follow these instructions carefully.   1. Shower the NIGHT BEFORE SURGERY and the MORNING OF SURGERY with CHG.   2. If you chose to wash your hair, wash your hair first as usual with your normal shampoo.  3. After you shampoo, rinse your hair and body thoroughly to remove the shampoo.  4. Use CHG as you would any other liquid soap. You can apply CHG directly to the skin and wash gently with a scrungie or a clean washcloth.   5. Apply the CHG Soap to your body ONLY FROM THE NECK DOWN.  Do not use on open wounds or open sores. Avoid contact with your eyes, ears, mouth and genitals (private parts). Wash Face and genitals (private parts)  with your normal soap.  6. Wash thoroughly, paying special attention to the area where your surgery will be performed.  7. Thoroughly rinse your body with warm water from  the neck down.  8. DO NOT shower/wash with your normal soap after using and rinsing off the CHG Soap.  9. Pat yourself dry with a CLEAN TOWEL.  10. Wear CLEAN PAJAMAS to bed the night before surgery, wear comfortable clothes the morning of surgery  11. Place CLEAN SHEETS on your bed the night of your first shower and DO NOT SLEEP WITH PETS.    Day of Surgery:  Do not apply any deodorants/lotions.  Please wear clean clothes to the hospital/surgery center.   Remember to brush your teeth WITH YOUR REGULAR TOOTHPASTE.   Please read over the following fact sheets that you were given.

## 2019-02-01 NOTE — Progress Notes (Signed)
PCP: Gary Potts, Gary M, MD  Subjective:   HPI: Patient is a 38 y.o. adult here for follow up of chronic right shoulder pain secondary to trauma. The pain occurs usualy with activity and is on entire shoulder mostly at anterior and posterior aspect and radiates to his arm. He was diagnosed with adhesive capsulitis and underwent multiple steroid injection with no significant improvement initially. Also received physical therapy and referred to orthopedics. His symptoms gradually improved. He mentions that he is now able to move his arm more although some of the movements are still limited. He recommended to get shoulder surgery but he prefers to resume non invasive management and came to clinic to get another evaluation. He denies fever, chills. No joint swelling or redness. No weakness.  Past Medical History:  Diagnosis Date  . Anxiety   . Migraines   . Urticaria     Current Outpatient Medications on File Prior to Visit  Medication Sig Dispense Refill  . amphetamine-dextroamphetamine (ADDERALL) 20 MG tablet Take 1 tablet (20 mg total) by mouth 2 (two) times daily. 60 tablet 0  . Ascorbic Acid (VITAMIN C) 500 MG CHEW Chew 500 mg by mouth daily.    . budesonide-formoterol (SYMBICORT) 80-4.5 MCG/ACT inhaler Inhale 2 puffs into the lungs 2 (two) times daily. (Patient taking differently: Inhale 2 puffs into the lungs 2 (two) times daily as needed (shortness of breath). ) 1 Inhaler 3  . clonazePAM (KLONOPIN) 0.5 MG tablet Take 0.5 tablets (0.25 mg total) by mouth every morning AND 1 tablet (0.5 mg total) at bedtime. (Patient taking differently: Take 0.5 mg by mouth twice daily) 45 tablet 0  . EPINEPHrine (EPIPEN 2-PAK) 0.3 mg/0.3 mL IJ SOAJ injection Use as directed for severe allergic reaction. 2 Device 1  . fluticasone (FLONASE) 50 MCG/ACT nasal spray Place 1 spray into both nostrils daily as needed for allergies or rhinitis.    Marland Kitchen. gabapentin (NEURONTIN) 300 MG/6ML solution Take 6-12 mLs (300-600 mg  total) by mouth at bedtime. 360 mL 2  . meloxicam (MOBIC) 15 MG tablet Take 1 tablet (15 mg total) by mouth daily. (Patient not taking: Reported on 01/30/2019) 30 tablet 0  . metoCLOPramide (REGLAN) 5 MG tablet Take 5 mg by mouth 4 (four) times daily.     . pantoprazole (PROTONIX) 40 MG tablet Take 1 tablet (40 mg total) by mouth daily. 30 minutes before a meal (Patient taking differently: Take 40 mg by mouth 2 (two) times daily. ) 30 tablet 1  . testosterone cypionate (DEPOTESTOSTERONE CYPIONATE) 200 MG/ML injection Inject 0.3 mLs (60 mg total) into the muscle once a week. (Patient taking differently: Inject 60 mg into the muscle every Wednesday. ) 2 mL 5  . TRULANCE 3 MG TABS Take 3 mg by mouth every evening.     No current facility-administered medications on file prior to visit.     Past Surgical History:  Procedure Laterality Date  . CHOLECYSTECTOMY  2014  . CYSTOURETHROSCOPY N/A 08/30/2017   [facility]  . OOPHORECTOMY Right 2005   found to be PARTIAL right oopherectomy only during 08/2017 total hyst w/ BSO  . TOTAL LAPAROSCOPIC HYSTERECTOMY WITH SALPINGECTOMY Bilateral 08/30/2017   [facility]; small 1 cm Rt ovarian reminant; no cervical, endometrial, B ovarian, or B fallopian tube pathologic diagnosis inc atypia, dysplasia, or malignancy; no need for continued pap screening    Allergies  Allergen Reactions  . Penicillins  Anaphylaxis and Rash    Pt can tolerate amoxicillin  Did it involve swelling of the face/tongue/throat, SOB, or low BP? Yes Did it involve sudden or severe rash/hives, skin peeling, or any reaction on the inside of your mouth or nose? No Did you need to seek medical attention at a hospital or doctor's office? Yes When did it last happen?8-9 years If all above answers are "NO", may proceed with cephalosporin use.  . Shellfish Allergy Anaphylaxis and Rash   . Latex Hives and Rash    Social History   Socioeconomic History  . Marital status: Married    Spouse name: Psychiatrist  . Number of children: 0  . Years of education: Not on file  . Highest education level: Bachelor's degree (e.g., BA, AB, BS)  Occupational History  . Occupation: Presenter, broadcasting  . Occupation: Ship broker    Comment: possible law school starting summer or fall 2019  Social Needs  . Financial resource strain: Not on file  . Food insecurity    Worry: Not on file    Inability: Not on file  . Transportation needs    Medical: Not on file    Non-medical: Not on file  Tobacco Use  . Smoking status: Never Smoker  . Smokeless tobacco: Never Used  Substance and Sexual Activity  . Alcohol use: Not Currently    Comment: rare- stop 06/20/2017  . Drug use: No  . Sexual activity: Yes    Partners: Female    Birth control/protection: Other-see comments, Surgical    Comment: pt is trans-male sexually involved with cis-females so no sperm involved in encounters  Lifestyle  . Physical activity    Days per week: Not on file    Minutes per session: Not on file  . Stress: Not on file  Relationships  . Social Herbalist on phone: Not on file    Gets together: Not on file    Attends religious service: Not on file    Active member of club or organization: Not on file    Attends meetings of clubs or organizations: Not on file    Relationship status: Not on file  . Intimate partner violence    Fear of current or ex partner: Not on file    Emotionally abused: Not on file    Physically abused: Not on file    Forced sexual activity: Not on file  Other Topics Concern  . Not on file  Social History Narrative   Given name - "Gary Potts - changed 06/2016.   Lives with wife Gary Potts.   Works [employer].    Family History  Problem Relation Age of Onset  . Allergic rhinitis Mother   . Sinusitis Mother   . Breast cancer Mother   . Sinusitis Sister   .  Urticaria Sister   . Breast cancer Sister   . Ovarian cancer Maternal Grandmother   . Breast cancer Maternal Grandmother   . Angioedema Neg Hx   . Asthma Neg Hx   . Eczema Neg Hx   . Immunodeficiency Neg Hx     BP 104/70   Ht 5\' 4"  (1.626 Potts)   Wt 162 lb (73.5 kg)   BMI 27.81 kg/Potts   Review of Systems: See HPI above.     Objective:  Physical Exam:  Gen: NAD, comfortable in exam room  Rt shoulder:  No deformity Mild tenderness to palpation of anterior and posterior shoulder ROM: Internal rotation and extension are  mildly limited. 5/5 strength No tenderness to palpation NVI distaly  Left shoulder:  No deformity FROM with 5/5 strength No tenderness to palpation NVI distaly  Assessment & Plan:  1. 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 ROM and pain has been improved significantly. He is not interested in surgery and also does not seem to require that at this point given improved ROM and no considerable chance of improvement with surgery.  -Encouraged to excercise left shoulder to improve range of motion -Sent refill for Voltaren gel for pain

## 2019-02-01 NOTE — Progress Notes (Addendum)
PCP:  Dr. Grant Fontana Cardiologist: Denies  EKG:  11/21/2018 CXR: denies Echo: denies Stress Test: denies Cardiac Cath: denies  Spoke with Stacy Gardner, [place] regarding lab work.  Only H/H needed.

## 2019-02-02 NOTE — Progress Notes (Signed)
[place] Attending Note: I have seen and examined this patient. I have discussed this patient with the resident and reviewed the assessment and plan as documented above. I agree with the resident's findings and plan. He has pretty good ROm now although lift off test still painful. I doubt suregey would improve situation. Still some discomfort at night. Also doubt surgery would help that. Discussed.

## 2019-02-04 ENCOUNTER — Other Ambulatory Visit (HOSPITAL_COMMUNITY)
Admission: RE | Admit: 2019-02-04 | Discharge: 2019-02-04 | Disposition: A | Discharge status: Home / Self Care | Payer: BC Managed Care – PPO | Source: Ambulatory Visit | Attending: Surgery | Admitting: Surgery

## 2019-02-04 ENCOUNTER — Encounter: Payer: Self-pay | Admitting: Family Medicine

## 2019-02-04 DIAGNOSIS — Z20828 Contact with and (suspected) exposure to other viral communicable diseases: Secondary | ICD-10-CM | POA: Insufficient documentation

## 2019-02-04 DIAGNOSIS — Z01812 Encounter for preprocedural laboratory examination: Secondary | ICD-10-CM | POA: Insufficient documentation

## 2019-02-04 DIAGNOSIS — N6310 Unspecified lump in the right breast, unspecified quadrant: Secondary | ICD-10-CM | POA: Diagnosis not present

## 2019-02-04 LAB — SARS CORONAVIRUS 2 (TAT 6-24 HRS): SARS Coronavirus 2: NEGATIVE

## 2019-02-06 NOTE — H&P (Signed)
[place]  Location: [facility] Patient #: [id number] DOB: 1980-07-22 Single / Language: Gary Potts / Race: Black or African American Male   History of Present Illness ( The patient is a 38 year old male who presents with breast cancer.  Additional reasons for visit:  Mass is described as the following: This is a 38 year old transgender male referred by Dr. Hassan Rowan for evaluation of right breast mass. He is BRCA 2 positive and has family murmurs with a history of breast cancer. He has been followed closely at the cancer center with mammograms and MRIs. The patient reports that the mass appeared approximate 2 months ago. It is mildly tender. An ultrasound and mammograms were performed showing a 5 mm mass at the parasternal area 2:00 on the right breast. It is irregular and subdermal. Stereotactic biopsy was not performed given the location. The patient reports discharge from the nipples which has been going on for several years. He had liposuction to his breasts in November 2019. He is otherwise without complaints.   Past Surgical History  Gallbladder Surgery - Laparoscopic  Mammoplasty; Reduction  Bilateral. Oral Surgery   Diagnostic Studies History Colonoscopy  within last year  Allergies Penicillins  Latex  Allergies Reconciled   Medication History  clonazePAM (2MG Tablet, Oral) Active. Gabapentin (100MG Capsule, Oral) Active. Omeprazole (10MG Capsule DR, Oral) Active. Singulair (4MG Packet, Oral) Active. Medications Reconciled  Social History  Alcohol use  Occasional alcohol use. Caffeine use  Coffee. No drug use  Tobacco use  Never smoker.  Family History  Breast Cancer  Sister.  Other Problems  Anxiety Disorder  Cholelithiasis  Gastroesophageal Reflux Disease     Review of Systems  General Not Present- Appetite Loss, Chills, Fatigue, Fever, Night Sweats, Weight Gain and Weight Loss. Skin Not Present- Change in Wart/Mole,  Dryness, Hives, Jaundice, New Lesions, Non-Healing Wounds, Rash and Ulcer. HEENT Present- Seasonal Allergies. Not Present- Earache, Hearing Loss, Hoarseness, Nose Bleed, Oral Ulcers, Ringing in the Ears, Sinus Pain, Sore Throat, Visual Disturbances, Wears glasses/contact lenses and Yellow Eyes. Breast Present- Breast Mass and Breast Pain. Not Present- Nipple Discharge and Skin Changes. Cardiovascular Not Present- Chest Pain, Difficulty Breathing Lying Down, Leg Cramps, Palpitations, Rapid Heart Rate, Shortness of Breath and Swelling of Extremities. Gastrointestinal Present- Constipation and Hemorrhoids. Not Present- Abdominal Pain, Bloating, Bloody Stool, Change in Bowel Habits, Chronic diarrhea, Difficulty Swallowing, Excessive gas, Gets full quickly at meals, Indigestion, Nausea, Rectal Pain and Vomiting. Male Genitourinary Not Present- Blood in Urine, Change in Urinary Stream, Frequency, Impotence, Nocturia, Painful Urination, Urgency and Urine Leakage. Musculoskeletal Not Present- Back Pain, Joint Pain, Joint Stiffness, Muscle Pain, Muscle Weakness and Swelling of Extremities. Neurological Not Present- Decreased Memory, Fainting, Headaches, Numbness, Seizures, Tingling, Tremor, Trouble walking and Weakness. Psychiatric Not Present- Anxiety, Bipolar, Change in Sleep Pattern, Depression, Fearful and Frequent crying. Endocrine Not Present- Cold Intolerance, Excessive Hunger, Hair Changes, Heat Intolerance, Hot flashes and New Diabetes. Hematology Not Present- Blood Thinners, Easy Bruising, Excessive bleeding, Gland problems, HIV and Persistent Infections.  Vitals Weight: 162.8 lb Height: 64in Body Surface Area: 1.79 m Body Mass Index: 27.94 kg/m  Temp.: 97.68F (Oral)  Pulse: 100 (Regular)  BP: 128/68(Sitting, Left Arm, Standard)     Physical Exam The physical exam findings are as follows: Note: Generally well appearance Lungs are clear bilaterally Cardiovascular is regular  rate and rhythm There is a 5 mm right breast mass adjacent to the sternum at the 2 o'clock position. There are no other  masses. The nipple areolar, which is normal. There is no axillary adenopathy on either side.  I have reviewed the patient's mammogram and ultrasound as well as notes from the cancer center    Assessment & Plan  BREAST MASS, RIGHT (N63.10)  Impression: This is a patient with a small right breast mass. Given his family history of breast cancer and his genetic status being BRCA 2 positive, a right breast lumpectomy is strongly recommended for histologic evaluation of the mass to rule out malignancy. We discussed the surgical procedure in detail. We discussed the risks which included but is not limited to bleeding, infection, the need for further procedures if malignancy is found, injury to surrounding structures, cardiac pulmonary issues, etc. He understands and wishes to proceed with surgery

## 2019-02-07 ENCOUNTER — Encounter (HOSPITAL_COMMUNITY)
Admission: RE | Disposition: A | Discharge status: Home / Self Care | Payer: Self-pay | Source: Home / Self Care | Attending: Surgery

## 2019-02-07 ENCOUNTER — Encounter (HOSPITAL_COMMUNITY): Payer: Self-pay | Admitting: *Deleted

## 2019-02-07 ENCOUNTER — Ambulatory Visit (HOSPITAL_COMMUNITY)
Admission: RE | Admit: 2019-02-07 | Discharge: 2019-02-07 | Disposition: A | Discharge status: Home / Self Care | Payer: BC Managed Care – PPO | Attending: Surgery | Admitting: Surgery

## 2019-02-07 ENCOUNTER — Ambulatory Visit (HOSPITAL_COMMUNITY): Payer: BC Managed Care – PPO | Admitting: Certified Registered Nurse Anesthetist

## 2019-02-07 ENCOUNTER — Ambulatory Visit (HOSPITAL_COMMUNITY): Payer: BC Managed Care – PPO | Admitting: Physician Assistant

## 2019-02-07 ENCOUNTER — Other Ambulatory Visit: Payer: Self-pay

## 2019-02-07 DIAGNOSIS — Z1501 Genetic susceptibility to malignant neoplasm of breast: Secondary | ICD-10-CM | POA: Diagnosis not present

## 2019-02-07 DIAGNOSIS — J45909 Unspecified asthma, uncomplicated: Secondary | ICD-10-CM | POA: Diagnosis not present

## 2019-02-07 DIAGNOSIS — K219 Gastro-esophageal reflux disease without esophagitis: Secondary | ICD-10-CM | POA: Insufficient documentation

## 2019-02-07 DIAGNOSIS — Z9049 Acquired absence of other specified parts of digestive tract: Secondary | ICD-10-CM | POA: Insufficient documentation

## 2019-02-07 DIAGNOSIS — N6452 Nipple discharge: Secondary | ICD-10-CM | POA: Diagnosis not present

## 2019-02-07 DIAGNOSIS — M1990 Unspecified osteoarthritis, unspecified site: Secondary | ICD-10-CM | POA: Insufficient documentation

## 2019-02-07 DIAGNOSIS — Z91040 Latex allergy status: Secondary | ICD-10-CM | POA: Insufficient documentation

## 2019-02-07 DIAGNOSIS — F419 Anxiety disorder, unspecified: Secondary | ICD-10-CM | POA: Diagnosis not present

## 2019-02-07 DIAGNOSIS — J4530 Mild persistent asthma, uncomplicated: Secondary | ICD-10-CM | POA: Diagnosis not present

## 2019-02-07 DIAGNOSIS — N6310 Unspecified lump in the right breast, unspecified quadrant: Secondary | ICD-10-CM | POA: Diagnosis not present

## 2019-02-07 DIAGNOSIS — Z803 Family history of malignant neoplasm of breast: Secondary | ICD-10-CM | POA: Insufficient documentation

## 2019-02-07 DIAGNOSIS — N63 Unspecified lump in unspecified breast: Secondary | ICD-10-CM | POA: Insufficient documentation

## 2019-02-07 DIAGNOSIS — F411 Generalized anxiety disorder: Secondary | ICD-10-CM | POA: Diagnosis not present

## 2019-02-07 DIAGNOSIS — R51 Headache: Secondary | ICD-10-CM | POA: Diagnosis not present

## 2019-02-07 DIAGNOSIS — Z880 Allergy status to penicillin: Secondary | ICD-10-CM | POA: Diagnosis not present

## 2019-02-07 DIAGNOSIS — N6001 Solitary cyst of right breast: Secondary | ICD-10-CM | POA: Diagnosis not present

## 2019-02-07 DIAGNOSIS — Z79899 Other long term (current) drug therapy: Secondary | ICD-10-CM | POA: Diagnosis not present

## 2019-02-07 DIAGNOSIS — N6031 Fibrosclerosis of right breast: Secondary | ICD-10-CM | POA: Diagnosis not present

## 2019-02-07 HISTORY — DX: Attention-deficit hyperactivity disorder, unspecified type: F90.9

## 2019-02-07 HISTORY — DX: Gastro-esophageal reflux disease without esophagitis: K21.9

## 2019-02-07 HISTORY — PX: BREAST LUMPECTOMY: SHX2

## 2019-02-07 HISTORY — DX: Unspecified asthma, uncomplicated: J45.909

## 2019-02-07 LAB — COMPREHENSIVE METABOLIC PANEL
ALT: 13 U/L (ref 0–44)
AST: 21 U/L (ref 15–41)
Albumin: 3.8 g/dL (ref 3.5–5.0)
Alkaline Phosphatase: 86 U/L (ref 38–126)
Anion gap: 9 (ref 5–15)
BUN: 7 mg/dL (ref 6–20)
CO2: 27 mmol/L (ref 22–32)
Calcium: 9.1 mg/dL (ref 8.9–10.3)
Chloride: 101 mmol/L (ref 98–111)
Creatinine, Ser: 1.24 mg/dL (ref 0.61–1.24)
GFR calc Af Amer: >60 mL/min (ref >60)
GFR calc non Af Amer: >60 mL/min (ref >60)
Glucose, Bld: 76 mg/dL (ref 70–99)
Potassium: 3.9 mmol/L (ref 3.5–5.1)
Sodium: 137 mmol/L (ref 135–145)
Total Bilirubin: 0.7 mg/dL (ref 0.3–1.2)
Total Protein: 6.8 g/dL (ref 6.5–8.1)

## 2019-02-07 LAB — CBC
HCT: 45.8 % (ref 39.0–52.0)
Hemoglobin: 14.7 g/dL (ref 13.0–17.0)
MCH: 25.5 pg — ABNORMAL LOW (ref 26.0–34.0)
MCHC: 32.1 g/dL (ref 30.0–36.0)
MCV: 79.5 fL — ABNORMAL LOW (ref 80.0–100.0)
Platelets: 354 10*3/uL (ref 150–400)
RBC: 5.76 MIL/uL (ref 4.22–5.81)
RDW: 15.2 % (ref 11.5–15.5)
WBC: 4.4 10*3/uL (ref 4.0–10.5)
nRBC: 0 % (ref 0.0–0.2)

## 2019-02-07 SURGERY — BREAST LUMPECTOMY
Anesthesia: General | Site: Breast | Laterality: Right

## 2019-02-07 MED ORDER — FENTANYL CITRATE (PF) 250 MCG/5ML IJ SOLN
INTRAMUSCULAR | Status: AC
  Filled 2019-02-07: qty 5

## 2019-02-07 MED ORDER — BUPIVACAINE-EPINEPHRINE (PF) 0.25% -1:200000 IJ SOLN
INTRAMUSCULAR | Status: AC
  Filled 2019-02-07: qty 30

## 2019-02-07 MED ORDER — STERILE WATER FOR IRRIGATION IR SOLN
Status: DC | PRN
  Administered 2019-02-07: 1000 mL

## 2019-02-07 MED ORDER — CHLORHEXIDINE GLUCONATE CLOTH 2 % EX PADS: 6.0000 | MEDICATED_PAD | Freq: Once | CUTANEOUS | Status: DC

## 2019-02-07 MED ORDER — HYDROMORPHONE HCL 1 MG/ML IJ SOLN: 0.2500 mg | INTRAMUSCULAR | Status: DC | PRN

## 2019-02-07 MED ORDER — CIPROFLOXACIN IN D5W 400 MG/200ML IV SOLN
INTRAVENOUS | Status: AC
  Filled 2019-02-07: qty 200

## 2019-02-07 MED ORDER — GABAPENTIN 300 MG PO CAPS
ORAL_CAPSULE | ORAL | Status: AC
  Administered 2019-02-07: 300 mg via ORAL
  Filled 2019-02-07: qty 1

## 2019-02-07 MED ORDER — DEXAMETHASONE SODIUM PHOSPHATE 10 MG/ML IJ SOLN
INTRAMUSCULAR | Status: DC | PRN
  Administered 2019-02-07: 5 mg via INTRAVENOUS

## 2019-02-07 MED ORDER — OXYCODONE HCL 5 MG PO TABS
ORAL_TABLET | ORAL | Status: AC
  Filled 2019-02-07: qty 1

## 2019-02-07 MED ORDER — FENTANYL CITRATE (PF) 250 MCG/5ML IJ SOLN
INTRAMUSCULAR | Status: DC | PRN
  Administered 2019-02-07 (×2): 50 ug via INTRAVENOUS

## 2019-02-07 MED ORDER — CELECOXIB 200 MG PO CAPS
200.0000 mg | ORAL_CAPSULE | ORAL | Status: AC
  Administered 2019-02-07: 09:00:00 200 mg via ORAL

## 2019-02-07 MED ORDER — OXYCODONE HCL 5 MG/5ML PO SOLN: 5.0000 mg | Freq: Once | ORAL | Status: AC | PRN

## 2019-02-07 MED ORDER — OXYCODONE HCL 5 MG PO TABS: 5.0000 mg | ORAL_TABLET | Freq: Four times a day (QID) | ORAL | 0 refills | Status: DC | PRN

## 2019-02-07 MED ORDER — ACETAMINOPHEN 500 MG PO TABS
1000.0000 mg | ORAL_TABLET | ORAL | Status: AC
  Administered 2019-02-07: 09:00:00 1000 mg via ORAL

## 2019-02-07 MED ORDER — PROMETHAZINE HCL 25 MG/ML IJ SOLN: 6.2500 mg | INTRAMUSCULAR | Status: DC | PRN

## 2019-02-07 MED ORDER — OXYCODONE HCL 5 MG PO TABS
5.0000 mg | ORAL_TABLET | Freq: Once | ORAL | Status: AC | PRN
  Administered 2019-02-07: 5 mg via ORAL

## 2019-02-07 MED ORDER — GABAPENTIN 300 MG PO CAPS
300.0000 mg | ORAL_CAPSULE | ORAL | Status: AC
  Administered 2019-02-07: 09:00:00 300 mg via ORAL

## 2019-02-07 MED ORDER — CELECOXIB 200 MG PO CAPS
ORAL_CAPSULE | ORAL | Status: AC
  Administered 2019-02-07: 200 mg via ORAL
  Filled 2019-02-07: qty 1

## 2019-02-07 MED ORDER — MEPERIDINE HCL 25 MG/ML IJ SOLN: 6.2500 mg | INTRAMUSCULAR | Status: DC | PRN

## 2019-02-07 MED ORDER — MIDAZOLAM HCL 2 MG/2ML IJ SOLN
INTRAMUSCULAR | Status: DC | PRN
  Administered 2019-02-07: 2 mg via INTRAVENOUS

## 2019-02-07 MED ORDER — MIDAZOLAM HCL 2 MG/2ML IJ SOLN
INTRAMUSCULAR | Status: AC
  Filled 2019-02-07: qty 2

## 2019-02-07 MED ORDER — LACTATED RINGERS IV SOLN
INTRAVENOUS | Status: DC
  Administered 2019-02-07: 1000 mL via INTRAVENOUS

## 2019-02-07 MED ORDER — CIPROFLOXACIN IN D5W 400 MG/200ML IV SOLN
400.0000 mg | INTRAVENOUS | Status: AC
  Administered 2019-02-07: 400 mg via INTRAVENOUS

## 2019-02-07 MED ORDER — ONDANSETRON HCL 4 MG/2ML IJ SOLN
INTRAMUSCULAR | Status: DC | PRN
  Administered 2019-02-07: 4 mg via INTRAVENOUS

## 2019-02-07 MED ORDER — PROPOFOL 10 MG/ML IV BOLUS
INTRAVENOUS | Status: DC | PRN
  Administered 2019-02-07: 200 mg via INTRAVENOUS

## 2019-02-07 MED ORDER — LIDOCAINE 2% (20 MG/ML) 5 ML SYRINGE
INTRAMUSCULAR | Status: DC | PRN
  Administered 2019-02-07: 80 mg via INTRAVENOUS

## 2019-02-07 MED ORDER — 0.9 % SODIUM CHLORIDE (POUR BTL) OPTIME
TOPICAL | Status: DC | PRN
  Administered 2019-02-07: 1000 mL

## 2019-02-07 MED ORDER — ACETAMINOPHEN 500 MG PO TABS
ORAL_TABLET | ORAL | Status: AC
  Administered 2019-02-07: 1000 mg via ORAL
  Filled 2019-02-07: qty 2

## 2019-02-07 MED ORDER — BUPIVACAINE-EPINEPHRINE 0.25% -1:200000 IJ SOLN
INTRAMUSCULAR | Status: DC | PRN
  Administered 2019-02-07: 11 mL

## 2019-02-07 MED ORDER — PROPOFOL 10 MG/ML IV BOLUS
INTRAVENOUS | Status: AC
  Filled 2019-02-07: qty 20

## 2019-02-07 MED ORDER — OXYCODONE HCL 5 MG PO TABS: 5.0000 mg | ORAL_TABLET | Freq: Four times a day (QID) | ORAL | 0 refills | Status: AC | PRN

## 2019-02-07 SURGICAL SUPPLY — 35 items
BINDER BREAST LRG (GAUZE/BANDAGES/DRESSINGS) IMPLANT
BINDER BREAST XLRG (GAUZE/BANDAGES/DRESSINGS) IMPLANT
CANISTER SUCT 3000ML PPV (MISCELLANEOUS) IMPLANT
CHLORAPREP W/TINT 26 (MISCELLANEOUS) ×2 IMPLANT
CONT SPEC 4OZ CLIKSEAL STRL BL (MISCELLANEOUS) IMPLANT
COVER SURGICAL LIGHT HANDLE (MISCELLANEOUS) ×2 IMPLANT
COVER WAND RF STERILE (DRAPES) ×2 IMPLANT
DECANTER SPIKE VIAL GLASS SM (MISCELLANEOUS) ×2 IMPLANT
DERMABOND ADVANCED (GAUZE/BANDAGES/DRESSINGS) ×1
DERMABOND ADVANCED .7 DNX12 (GAUZE/BANDAGES/DRESSINGS) ×1 IMPLANT
DEVICE DUBIN SPECIMEN MAMMOGRA (MISCELLANEOUS) IMPLANT
DRAPE LAPAROTOMY 100X72 PEDS (DRAPES) ×2 IMPLANT
ELECT REM PT RETURN 9FT ADLT (ELECTROSURGICAL) ×2
ELECTRODE REM PT RTRN 9FT ADLT (ELECTROSURGICAL) ×1 IMPLANT
GAUZE 4X4 16PLY RFD (DISPOSABLE) ×2 IMPLANT
GLOVE SURG SIGNA 7.5 PF LTX (GLOVE) ×2 IMPLANT
GOWN STRL REUS W/ TWL LRG LVL3 (GOWN DISPOSABLE) ×1 IMPLANT
GOWN STRL REUS W/ TWL XL LVL3 (GOWN DISPOSABLE) ×1 IMPLANT
GOWN STRL REUS W/TWL LRG LVL3 (GOWN DISPOSABLE) ×1
GOWN STRL REUS W/TWL XL LVL3 (GOWN DISPOSABLE) ×1
KIT BASIN OR (CUSTOM PROCEDURE TRAY) ×2 IMPLANT
KIT MARKER MARGIN INK (KITS) IMPLANT
KIT TURNOVER KIT B (KITS) ×2 IMPLANT
NDL HYPO 25GX1X1/2 BEV (NEEDLE) ×1 IMPLANT
NEEDLE HYPO 25GX1X1/2 BEV (NEEDLE) ×2 IMPLANT
NS IRRIG 1000ML POUR BTL (IV SOLUTION) ×2 IMPLANT
PACK GENERAL/GYN (CUSTOM PROCEDURE TRAY) ×2 IMPLANT
PAD ARMBOARD 7.5X6 YLW CONV (MISCELLANEOUS) ×2 IMPLANT
PENCIL SMOKE EVACUATOR (MISCELLANEOUS) ×2 IMPLANT
SUT MNCRL AB 4-0 PS2 18 (SUTURE) ×2 IMPLANT
SUT VIC AB 3-0 SH 27 (SUTURE) ×1
SUT VIC AB 3-0 SH 27X BRD (SUTURE) ×1 IMPLANT
SYR CONTROL 10ML LL (SYRINGE) ×2 IMPLANT
TOWEL GREEN STERILE (TOWEL DISPOSABLE) ×2 IMPLANT
TOWEL GREEN STERILE FF (TOWEL DISPOSABLE) ×2 IMPLANT

## 2019-02-07 NOTE — Transfer of Care (Signed)
Immediate Anesthesia Transfer of Care Note  Patient: [facility]  Procedure(s) Performed: RIGHT BREAST LUMPECTOMY (Right Breast)  Patient Location: PACU  Anesthesia Type:General  Level of Consciousness: drowsy and patient cooperative  Airway & Oxygen Therapy: Patient Spontanous Breathing  Post-op Assessment: Report given to RN and Post -op Vital signs reviewed and stable  Post vital signs: Reviewed and stable  Last Vitals:  Vitals Value Taken Time  BP 114/70 02/07/19 1017  Temp    Pulse 104 02/07/19 1018  Resp 17 02/07/19 1018  SpO2 92 % 02/07/19 1018  Vitals shown include unvalidated device data.  Last Pain:  Vitals:   02/07/19 0855  PainSc: 0-No pain      Patients Stated Pain Goal: 3 (18/56/31 4970)  Complications: No apparent anesthesia complications

## 2019-02-07 NOTE — Discharge Instructions (Signed)
[facility] Office Phone Number [phone]  BREAST BIOPSY/ PARTIAL MASTECTOMY: POST OP INSTRUCTIONS  Always review your discharge instruction sheet given to you by the facility where your surgery was performed.  IF YOU HAVE DISABILITY OR FAMILY LEAVE FORMS, YOU MUST BRING THEM TO THE OFFICE FOR PROCESSING.  DO NOT GIVE THEM TO YOUR DOCTOR.  1. A prescription for pain medication may be given to you upon discharge.  Take your pain medication as prescribed, if needed.  If narcotic pain medicine is not needed, then you may take acetaminophen (Tylenol) or ibuprofen (Advil) as needed. 2. Take your usually prescribed medications unless otherwise directed 3. If you need a refill on your pain medication, please contact your pharmacy.  They will contact our office to request authorization.  Prescriptions will not be filled after 5pm or on week-ends. 4. You should eat very light the first 24 hours after surgery, such as soup, crackers, pudding, etc.  Resume your normal diet the day after surgery. 5. Most patients will experience some swelling and bruising in the breast.  Ice packs and a good support bra will help.  Swelling and bruising can take several days to resolve.  6. It is common to experience some constipation if taking pain medication after surgery.  Increasing fluid intake and taking a stool softener will usually help or prevent this problem from occurring.  A mild laxative (Milk of Magnesia or Miralax) should be taken according to package directions if there are no bowel movements after 48 hours. 7. Unless discharge instructions indicate otherwise, you may remove your bandages 24-48 hours after surgery, and you may shower at that time.  You may have steri-strips (small skin tapes) in place directly over the incision.  These strips should be left on the skin for 7-10 days.  If your surgeon used skin glue on the incision, you may shower in 24 hours.  The glue will flake off over the  next 2-3 weeks.  Any sutures or staples will be removed at the office during your follow-up visit. 8. ACTIVITIES:  You may resume regular daily activities (gradually increasing) beginning the next day.  Wearing a good support bra or sports bra minimizes pain and swelling.  You may have sexual intercourse when it is comfortable. a. You may drive when you no longer are taking prescription pain medication, you can comfortably wear a seatbelt, and you can safely maneuver your car and apply brakes. b. RETURN TO WORK:  ______________________________________________________________________________________ 9. You should see your doctor in the office for a follow-up appointment approximately two weeks after your surgery.  Your doctor's nurse will typically make your follow-up appointment when she calls you with your pathology report.  Expect your pathology report 2-3 business days after your surgery.  You may call to check if you do not hear from us after three days. 10. OTHER INSTRUCTIONS:OK TO SHOWER STARTING TOMORROW 11. ICE PACK, TYLENOL, IBUPROFEN ALSO FOR PAIN 12. NO VIGOROUS ACTIVITY FOR ONE WEEK _______________________________________________________________________________________________ _____________________________________________________________________________________________________________________________________ _____________________________________________________________________________________________________________________________________ _____________________________________________________________________________________________________________________________________  WHEN TO CALL YOUR DOCTOR: 1. Fever over 101.0 2. Nausea and/or vomiting. 3. Extreme swelling or bruising. 4. Continued bleeding from incision. 5. Increased pain, redness, or drainage from the incision.  The clinic staff is available to answer your questions during regular business hours.  Please don't hesitate to call  and ask to speak to one of the nurses for clinical concerns.  If you have a medical emergency, go to the nearest emergency room or call 911.  A surgeon   from [facility] is always on call at the hospital.  For further questions, please visit [email or web address]  

## 2019-02-07 NOTE — Interval H&P Note (Signed)
History and Physical Interval Note:no change in H and P  02/07/2019 9:24 AM  [place]  has presented today for surgery, with the diagnosis of right breast mass.  The various methods of treatment have been discussed with the patient and family. After consideration of risks, benefits and other options for treatment, the patient has consented to  Procedure(s): RIGHT BREAST LUMPECTOMY (Left) as a surgical intervention.  The patient's history has been reviewed, patient examined, no change in status, stable for surgery.  I have reviewed the patient's chart and labs.  Questions were answered to the patient's satisfaction.     Coralie Keens

## 2019-02-07 NOTE — Anesthesia Preprocedure Evaluation (Signed)
Anesthesia Evaluation  Patient identified by MRN, date of birth, ID band Patient awake    Reviewed: Allergy & Precautions, NPO status , Patient's Chart, lab work & pertinent test results  Airway Mallampati: II  TM Distance: >3 FB Neck ROM: Full    Dental no notable dental hx.    Pulmonary asthma ,    Pulmonary exam normal breath sounds clear to auscultation       Cardiovascular negative cardio ROS Normal cardiovascular exam Rhythm:Regular Rate:Normal     Neuro/Psych  Headaches, Anxiety negative psych ROS   GI/Hepatic Neg liver ROS, GERD  ,  Endo/Other  negative endocrine ROS  Renal/GU negative Renal ROS  negative genitourinary   Musculoskeletal  (+) Arthritis ,   Abdominal   Peds negative pediatric ROS (+)  Hematology negative hematology ROS (+)   Anesthesia Other Findings   Reproductive/Obstetrics negative OB ROS                             Anesthesia Physical Anesthesia Plan  ASA: II  Anesthesia Plan: General   Post-op Pain Management:    Induction: Intravenous  PONV Risk Score and Plan: 2 and Ondansetron, Midazolam and Treatment may vary due to age or medical condition  Airway Management Planned: LMA  Additional Equipment:   Intra-op Plan:   Post-operative Plan: Extubation in OR  Informed Consent: I have reviewed the patients History and Physical, chart, labs and discussed the procedure including the risks, benefits and alternatives for the proposed anesthesia with the patient or authorized representative who has indicated his/her understanding and acceptance.     Dental advisory given  Plan Discussed with: CRNA  Anesthesia Plan Comments:         Anesthesia Quick Evaluation

## 2019-02-07 NOTE — Op Note (Signed)
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  A 5 mm breast mass was found on examination and ultrasound addition to the sternum of the right side.  The decision was made given the genetic history of performing a right breast lumpectomy  Procedure: The patient was brought to the operating room and identifies correct patient.  He was placed upon the operating room table and general anesthesia was induced.  The right chest and breast were prepped and draped in usual sterile fashion I anesthetized the skin over the palpable mass adjacent to the sternum with Marcaine.  I then made a longitudinal incision over the top of the mass with the cautery.  I then performed a lumpectomy excising the mass and surrounding breast tissue with the cautery going down to the chest wall.  The mass was sent to pathology for evaluation.  I anesthetized the incision further with Marcaine.  Hemostasis was achieved with cautery.  I then closed the incision with interrupted 3-0 Vicryl sutures and a running 4-0 Monocryl.  Dermabond was then applied.  The patient tolerated the procedure well.  All counts were correct at the end of the procedure.  He was then extubated in the operating room and taken in a stable condition to the recovery room.          Coralie Keens   Date: 02/07/2019  Time: 10:10 AM

## 2019-02-07 NOTE — Anesthesia Procedure Notes (Signed)
Procedure Name: LMA Insertion Date/Time: 02/07/2019 9:49 AM Performed by: Elayne Snare, CRNA Pre-anesthesia Checklist: Patient identified, Emergency Drugs available, Suction available and Patient being monitored Patient Re-evaluated:Patient Re-evaluated prior to induction Oxygen Delivery Method: Circle System Utilized Preoxygenation: Pre-oxygenation with 100% oxygen Induction Type: IV induction Ventilation: Mask ventilation without difficulty LMA: LMA inserted LMA Size: 3.0 Number of attempts: 1 Placement Confirmation: positive ETCO2 Tube secured with: Tape Dental Injury: Teeth and Oropharynx as per pre-operative assessment

## 2019-02-08 ENCOUNTER — Encounter (HOSPITAL_COMMUNITY): Payer: Self-pay | Admitting: Surgery

## 2019-02-08 NOTE — Anesthesia Postprocedure Evaluation (Signed)
Anesthesia Post Note  Patient: [facility]  Procedure(s) Performed: RIGHT BREAST LUMPECTOMY (Right Breast)     Patient location during evaluation: PACU Anesthesia Type: General Level of consciousness: awake and alert Pain management: pain level controlled Vital Signs Assessment: post-procedure vital signs reviewed and stable Respiratory status: spontaneous breathing, nonlabored ventilation and respiratory function stable Cardiovascular status: blood pressure returned to baseline and stable Postop Assessment: no apparent nausea or vomiting Anesthetic complications: no    Last Vitals:  Vitals:   02/07/19 1041 02/07/19 1104  BP: 118/82 (!) 143/84  Pulse: (!) 103 86  Resp:  16  Temp: 36.5 C   SpO2:  100%    Last Pain:  Vitals:   02/07/19 1104  PainSc: [place] Ray Alayla Dethlefs

## 2019-02-11 DIAGNOSIS — K3184 Gastroparesis: Secondary | ICD-10-CM | POA: Diagnosis not present

## 2019-02-11 DIAGNOSIS — K5900 Constipation, unspecified: Secondary | ICD-10-CM | POA: Diagnosis not present

## 2019-02-11 DIAGNOSIS — F411 Generalized anxiety disorder: Secondary | ICD-10-CM | POA: Diagnosis not present

## 2019-02-11 DIAGNOSIS — F341 Dysthymic disorder: Secondary | ICD-10-CM | POA: Diagnosis not present

## 2019-03-04 DIAGNOSIS — F411 Generalized anxiety disorder: Secondary | ICD-10-CM | POA: Diagnosis not present

## 2019-03-04 DIAGNOSIS — J4540 Moderate persistent asthma, uncomplicated: Secondary | ICD-10-CM | POA: Diagnosis not present

## 2019-03-04 DIAGNOSIS — K3184 Gastroparesis: Secondary | ICD-10-CM | POA: Diagnosis not present

## 2019-03-04 DIAGNOSIS — F649 Gender identity disorder, unspecified: Secondary | ICD-10-CM | POA: Diagnosis not present

## 2019-03-04 DIAGNOSIS — R590 Localized enlarged lymph nodes: Secondary | ICD-10-CM | POA: Diagnosis not present

## 2019-03-14 ENCOUNTER — Other Ambulatory Visit: Payer: Self-pay | Admitting: *Deleted

## 2019-03-14 MED ORDER — DICLOFENAC SODIUM 1 % TD GEL: 2.0000 g | Freq: Four times a day (QID) | TRANSDERMAL | 1 refills | Status: DC

## 2019-03-19 ENCOUNTER — Ambulatory Visit
Admission: RE | Admit: 2019-03-19 | Discharge: 2019-03-19 | Disposition: A | Discharge status: Home / Self Care | Payer: BC Managed Care – PPO | Source: Ambulatory Visit | Attending: *Deleted | Admitting: *Deleted

## 2019-03-19 ENCOUNTER — Other Ambulatory Visit: Payer: Self-pay | Admitting: *Deleted

## 2019-03-19 ENCOUNTER — Other Ambulatory Visit: Payer: Self-pay | Admitting: Family Medicine

## 2019-03-19 DIAGNOSIS — M545 Low back pain, unspecified: Secondary | ICD-10-CM

## 2019-03-19 DIAGNOSIS — M542 Cervicalgia: Secondary | ICD-10-CM

## 2019-03-20 ENCOUNTER — Inpatient Hospital Stay: Payer: BC Managed Care – PPO | Attending: Hematology and Oncology | Admitting: Hematology and Oncology

## 2019-03-20 NOTE — Assessment & Plan Note (Deleted)
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 Status post hysterectomy and bilateral salpingo-nephrectomy.  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  Surveillance: Screening MRI will be performed in October 2020 Continue with annual MRIs and mammograms for screening.  This is not been done for the BRCA which was a VUS but because of the high lifetime risk by [employer] model   Recommendation: Annual mammograms and breast MRIs for surveillance.

## 2019-04-19 DIAGNOSIS — F411 Generalized anxiety disorder: Secondary | ICD-10-CM | POA: Diagnosis not present

## 2019-04-19 DIAGNOSIS — F5105 Insomnia due to other mental disorder: Secondary | ICD-10-CM | POA: Diagnosis not present

## 2019-04-19 DIAGNOSIS — F649 Gender identity disorder, unspecified: Secondary | ICD-10-CM | POA: Diagnosis not present

## 2019-04-19 DIAGNOSIS — F341 Dysthymic disorder: Secondary | ICD-10-CM | POA: Diagnosis not present

## 2019-05-07 DIAGNOSIS — F411 Generalized anxiety disorder: Secondary | ICD-10-CM | POA: Diagnosis not present

## 2019-05-13 ENCOUNTER — Encounter
Admit: 2019-05-13 | Discharge: 2019-05-14 | Payer: PRIVATE HEALTH INSURANCE | Attending: Student in an Organized Health Care Education/Training Program | Primary: Student in an Organized Health Care Education/Training Program

## 2019-05-13 DIAGNOSIS — F640 Transsexualism: Principal | ICD-10-CM

## 2019-05-13 DIAGNOSIS — F411 Generalized anxiety disorder: Principal | ICD-10-CM

## 2019-05-13 DIAGNOSIS — F902 Attention-deficit hyperactivity disorder, combined type: Principal | ICD-10-CM

## 2019-05-13 DIAGNOSIS — K3184 Gastroparesis: Principal | ICD-10-CM

## 2019-05-13 MED ORDER — TESTOSTERONE CYPIONATE 200 MG/ML INTRAMUSCULAR OIL
INTRAMUSCULAR | 0 refills | 91.00000 days | Status: CP
Start: 2019-05-13 — End: 2019-08-11

## 2019-05-13 MED ORDER — DEXTROAMPHETAMINE-AMPHETAMINE 20 MG TABLET
ORAL_TABLET | Freq: Two times a day (BID) | ORAL | 0 refills | 30 days | Status: CP
Start: 2019-05-13 — End: 2019-06-12

## 2019-05-13 MED ORDER — CLONAZEPAM 0.5 MG TABLET
ORAL_TABLET | Freq: Every day | ORAL | 0 refills | 30 days | Status: CP | PRN
Start: 2019-05-13 — End: ?

## 2019-05-21 DIAGNOSIS — F411 Generalized anxiety disorder: Secondary | ICD-10-CM | POA: Diagnosis not present

## 2019-05-27 DIAGNOSIS — K5900 Constipation, unspecified: Secondary | ICD-10-CM | POA: Diagnosis not present

## 2019-05-27 DIAGNOSIS — K3184 Gastroparesis: Secondary | ICD-10-CM | POA: Diagnosis not present

## 2019-05-27 DIAGNOSIS — K219 Gastro-esophageal reflux disease without esophagitis: Secondary | ICD-10-CM | POA: Diagnosis not present

## 2019-06-20 ENCOUNTER — Encounter: Admit: 2019-06-20 | Discharge: 2019-06-21 | Payer: PRIVATE HEALTH INSURANCE

## 2019-06-27 ENCOUNTER — Encounter
Admit: 2019-06-27 | Discharge: 2019-06-28 | Payer: PRIVATE HEALTH INSURANCE | Attending: Student in an Organized Health Care Education/Training Program | Primary: Student in an Organized Health Care Education/Training Program

## 2019-06-27 MED ORDER — TESTOSTERONE CYPIONATE 200 MG/ML INTRAMUSCULAR OIL
INTRAMUSCULAR | 0 refills | 91 days | Status: CP
Start: 2019-06-27 — End: 2019-09-25

## 2019-06-27 MED ORDER — PANTOPRAZOLE 40 MG TABLET,DELAYED RELEASE
ORAL_TABLET | Freq: Every day | ORAL | 3 refills | 90.00000 days | Status: CP
Start: 2019-06-27 — End: 2020-06-26

## 2019-06-27 MED ORDER — DEXTROAMPHETAMINE-AMPHETAMINE 20 MG TABLET
ORAL_TABLET | Freq: Two times a day (BID) | ORAL | 0 refills | 30 days | Status: CP
Start: 2019-06-27 — End: 2019-07-27

## 2019-06-27 MED ORDER — RIZATRIPTAN 10 MG TABLET
ORAL_TABLET | Freq: Once | ORAL | 2 refills | 0 days | Status: CP | PRN
Start: 2019-06-27 — End: ?

## 2019-07-04 ENCOUNTER — Encounter: Admit: 2019-07-04 | Discharge: 2019-07-05 | Payer: PRIVATE HEALTH INSURANCE

## 2019-07-13 MED ORDER — FERROUS SULFATE 325 MG (65 MG IRON) TABLET
ORAL_TABLET | ORAL | 3 refills | 90.00000 days | Status: CP
Start: 2019-07-13 — End: 2020-07-12

## 2019-07-17 ENCOUNTER — Encounter
Admit: 2019-07-17 | Discharge: 2019-07-18 | Payer: PRIVATE HEALTH INSURANCE | Attending: Gastroenterology | Primary: Gastroenterology

## 2019-07-17 MED ORDER — LUBIPROSTONE 24 MCG CAPSULE
ORAL_CAPSULE | Freq: Two times a day (BID) | ORAL | 2 refills | 30 days | Status: CP
Start: 2019-07-17 — End: 2019-10-15

## 2019-07-17 MED ORDER — SUCRALFATE 1 GRAM TABLET
ORAL_TABLET | Freq: Four times a day (QID) | ORAL | 5 refills | 30 days | Status: CP
Start: 2019-07-17 — End: 2020-01-13

## 2019-07-20 ENCOUNTER — Ambulatory Visit: Admit: 2019-07-20 | Discharge: 2019-07-21 | Payer: PRIVATE HEALTH INSURANCE

## 2019-07-20 DIAGNOSIS — R509 Fever, unspecified: Principal | ICD-10-CM

## 2019-07-20 DIAGNOSIS — H6691 Otitis media, unspecified, right ear: Principal | ICD-10-CM

## 2019-07-20 DIAGNOSIS — H9201 Otalgia, right ear: Principal | ICD-10-CM

## 2019-07-20 MED ORDER — AMOXICILLIN 875 MG-POTASSIUM CLAVULANATE 125 MG TABLET
ORAL_TABLET | Freq: Two times a day (BID) | ORAL | 0 refills | 10.00000 days | Status: CP
Start: 2019-07-20 — End: 2019-07-30

## 2019-07-28 MED ORDER — DEXTROAMPHETAMINE-AMPHETAMINE 20 MG TABLET
ORAL_TABLET | Freq: Two times a day (BID) | ORAL | 0 refills | 30.00000 days | Status: CP
Start: 2019-07-28 — End: 2019-08-27

## 2019-08-08 ENCOUNTER — Encounter: Admit: 2019-08-08 | Discharge: 2019-08-09 | Payer: PRIVATE HEALTH INSURANCE

## 2019-08-08 MED ORDER — TOPIRAMATE 25 MG TABLET
ORAL_TABLET | 2 refills | 0.00000 days | Status: CP
Start: 2019-08-08 — End: 2019-08-08

## 2019-08-08 MED ORDER — TOPIRAMATE 25 MG TABLET: tablet | 2 refills | 0 days | Status: AC

## 2019-08-08 MED ORDER — NAPROXEN 500 MG TABLET
ORAL_TABLET | Freq: Two times a day (BID) | ORAL | 3 refills | 15 days | Status: CP | PRN
Start: 2019-08-08 — End: 2019-10-07

## 2019-08-10 ENCOUNTER — Encounter: Admit: 2019-08-10 | Discharge: 2019-08-11 | Payer: PRIVATE HEALTH INSURANCE | Attending: Family | Primary: Family

## 2019-08-13 ENCOUNTER — Encounter: Admit: 2019-08-13 | Discharge: 2019-08-13 | Payer: PRIVATE HEALTH INSURANCE

## 2019-08-20 IMAGING — CT CT MAXILLOFACIAL W/O CM
3 of 5 series · 13 of 47 positions shown, 15 images · non-contrast
Comparison: None.

CLINICAL DATA: Congestion with nasal discharge and recurrent sinus
infections

EXAM:
CT MAXILLOFACIAL WITHOUT CONTRAST
TECHNIQUE: Multidetector CT imaging of the maxillofacial structures was
performed. Multiplanar CT image reconstructions were also generated.

[Series 3: sinus 2.00 hr60 s3 ax · axial · 0.24mm/px · z∈[-496,-419]mm · 7 of 51 slices shown, 9 images]
[im 6/51  brain]
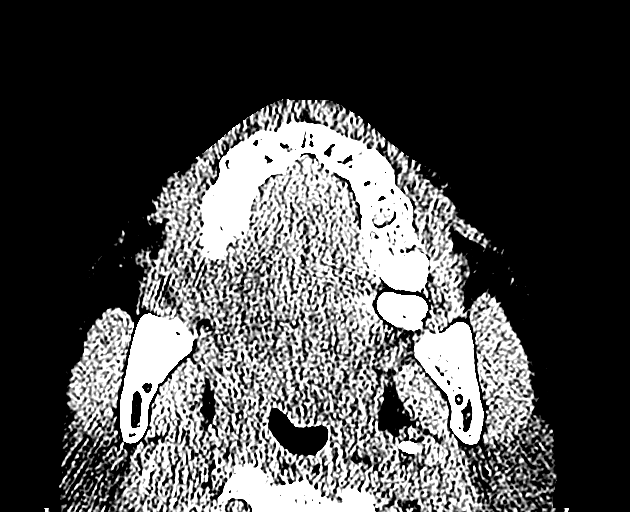
[im 6/51  bone]
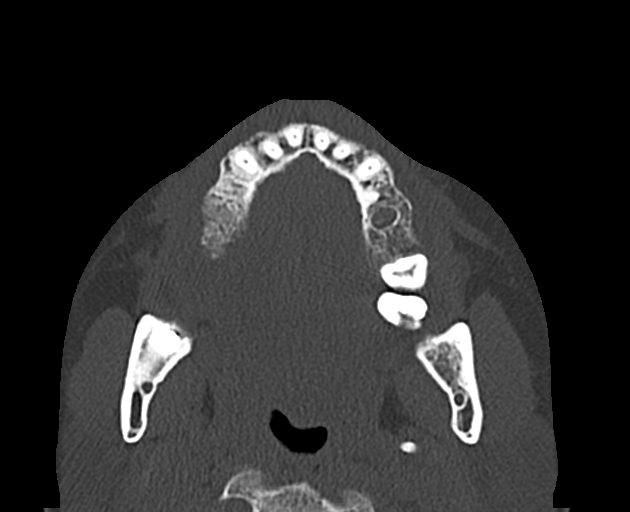
[im 12/51  bone]
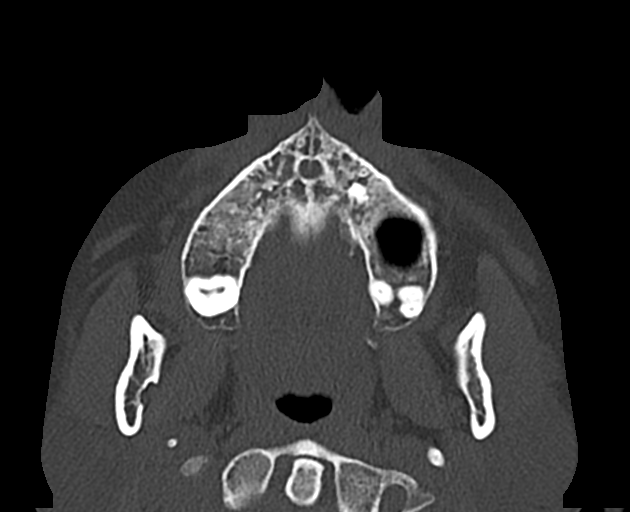
[im 17/51  bone]
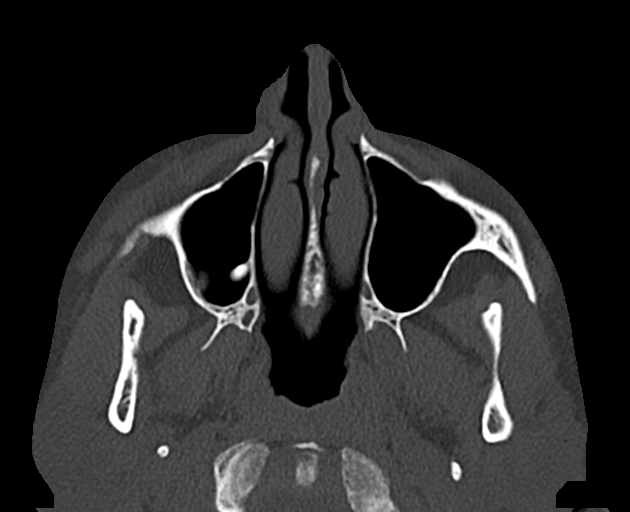
[im 28/51  bone]
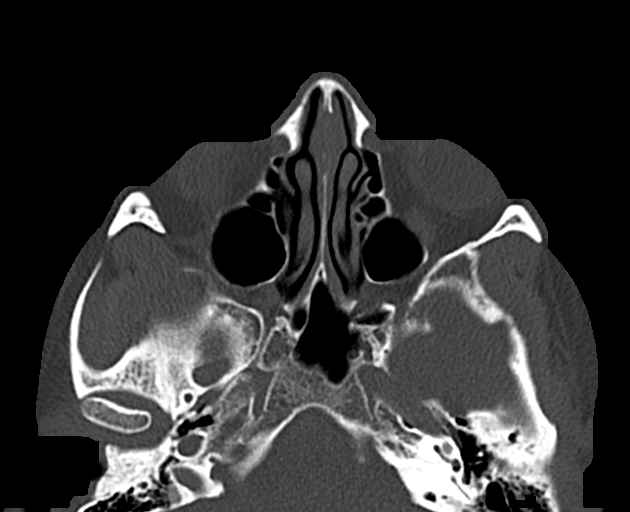
[im 34/51  brain]
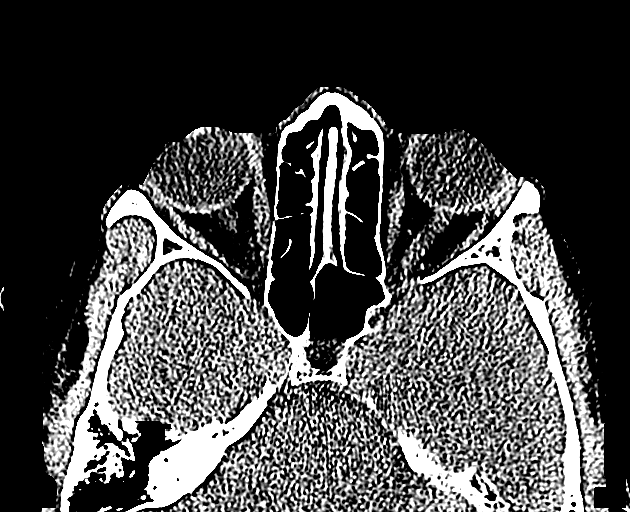
[im 34/51  bone]
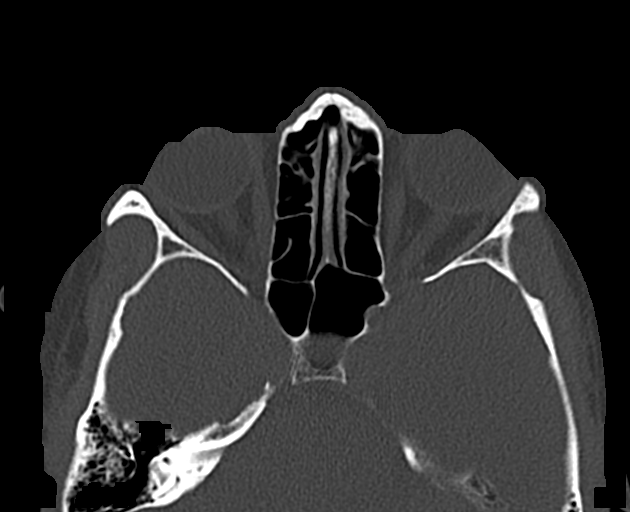
[im 39/51  bone]
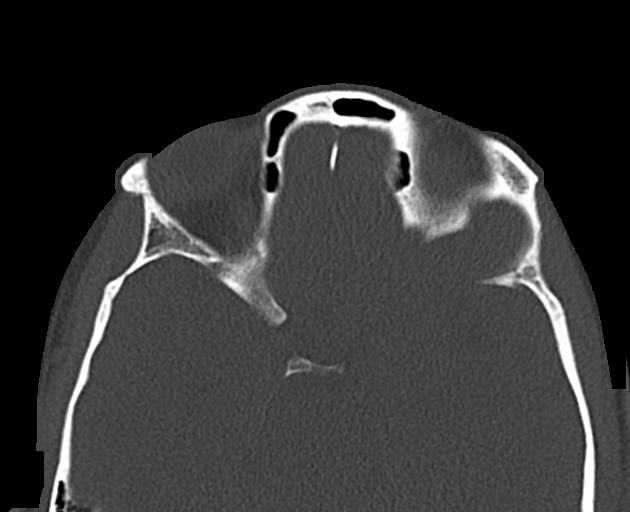
[im 45/51  bone]
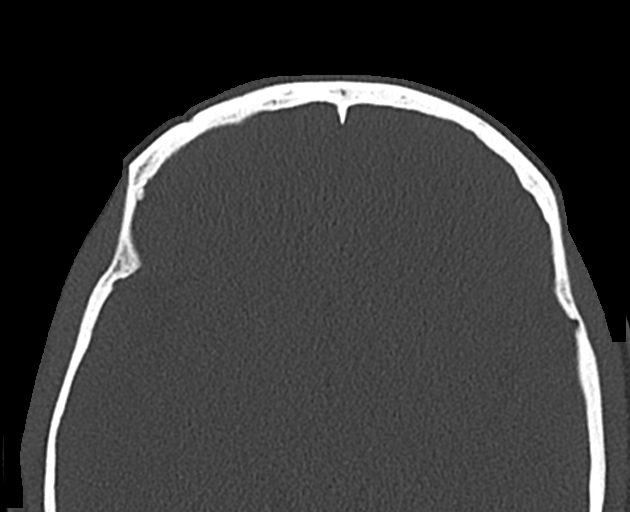

[Series 5: sinus 2.00 hr60 s3 cor · coronal · 0.20mm/px · 3 of 62 slices shown]
[im 21/62  bone]
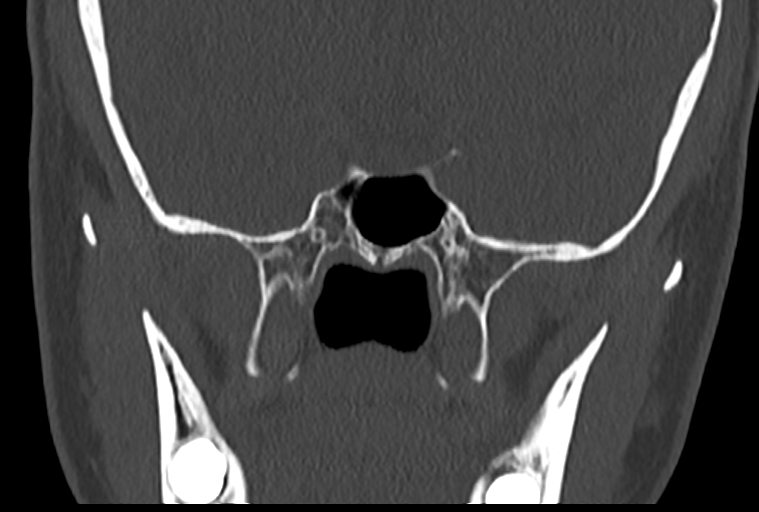
[im 28/62  bone]
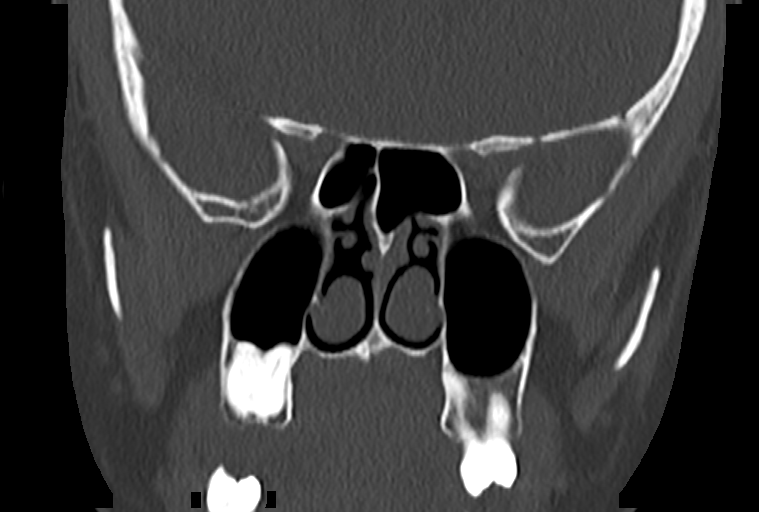
[im 34/62  bone]
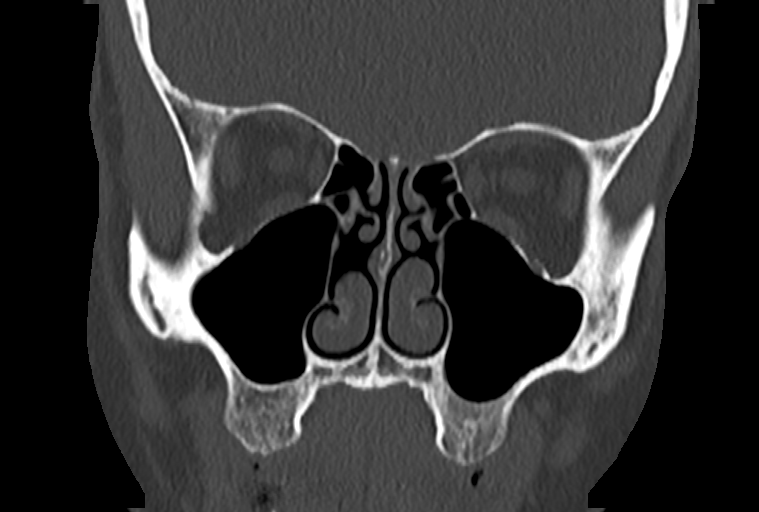

[Series 7: sinus 2.00 hr60 s3 sag · sagittal · 0.20mm/px · 3 of 76 slices shown]
[im 26/76  bone]
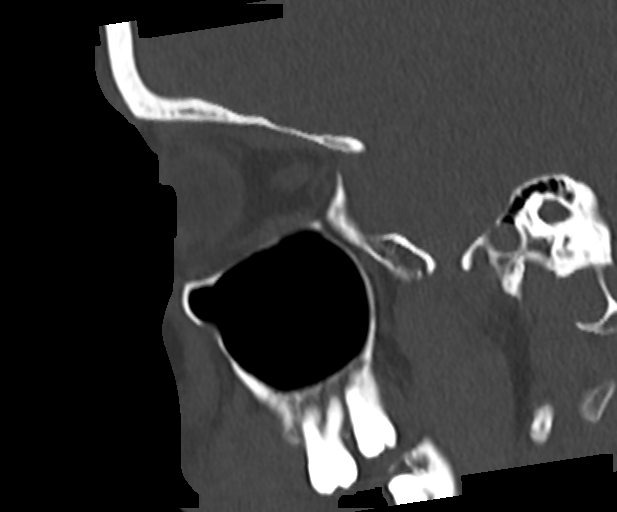
[im 38/76  bone]
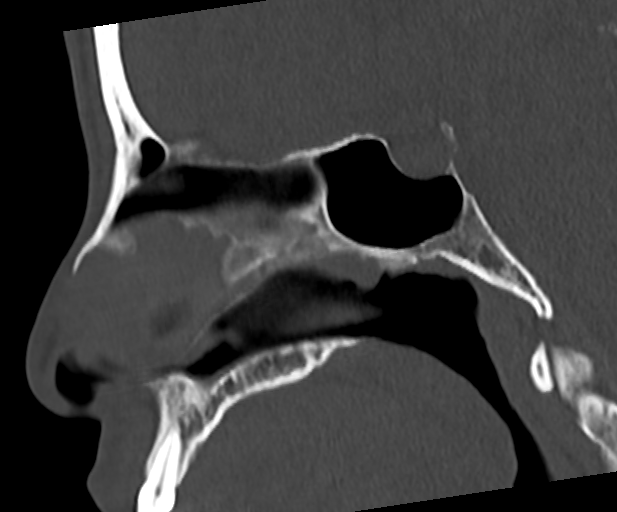
[im 51/76  bone]
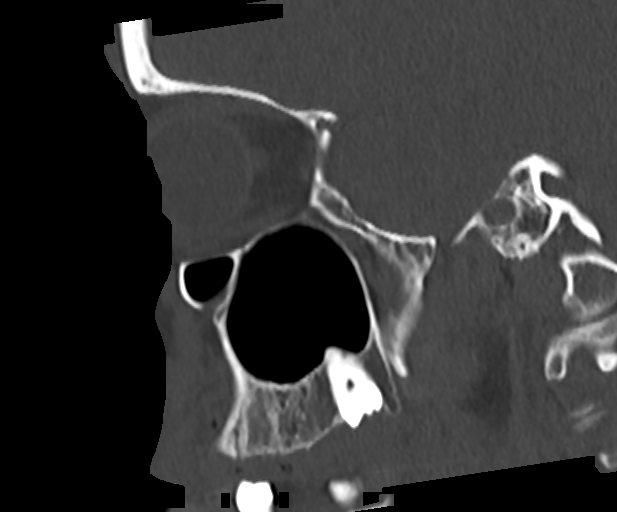

[13 of 47 positions shown; findings below may reference images not displayed]

FINDINGS: Osseous: No evident fracture or dislocation. No blastic or lytic
bone lesions.

Orbits: Orbits appear symmetric bilaterally. No intraorbital lesions
are evident.

Sinuses: There is mild mucosal thickening in several ethmoid air
cells bilaterally. Frontal sinuses are somewhat hypoplastic. Other
paranasal sinuses are clear. No air-fluid levels. No bony
destruction or expansion. Ostiomeatal unit complexes are patent
bilaterally. There is no nares obstruction. The nasal septum is
midline.

Soft tissues: No evident soft tissue mass or adenopathy. Visualized
upper pharynx appears normal. Visualized tongue and tongue base
regions appear normal.

Limited intracranial: Visualized mastoid air cells are clear.
Visualized intracranial regions appear unremarkable.
IMPRESSION: Mucosal thickening noted in several ethmoid air cells. No ethmoid
air cell opacification noted. Other paranasal sinuses are clear. No
air-fluid levels. No bony destruction or expansion. Ostiomeatal unit
complexes are patent bilaterally.

Study otherwise unremarkable.

## 2019-08-25 MED ORDER — DEXTROAMPHETAMINE-AMPHETAMINE 20 MG TABLET
ORAL_TABLET | Freq: Two times a day (BID) | ORAL | 0 refills | 30 days | Status: CP
Start: 2019-08-25 — End: 2019-09-24

## 2019-08-30 ENCOUNTER — Encounter
Admit: 2019-08-30 | Discharge: 2019-08-31 | Payer: PRIVATE HEALTH INSURANCE | Attending: Plastic and Reconstructive Surgery | Primary: Plastic and Reconstructive Surgery

## 2019-08-30 DIAGNOSIS — Z1501 Genetic susceptibility to malignant neoplasm of breast: Principal | ICD-10-CM

## 2019-08-30 DIAGNOSIS — Z1509 Genetic susceptibility to other malignant neoplasm: Principal | ICD-10-CM

## 2019-09-20 ENCOUNTER — Encounter: Admit: 2019-09-20 | Discharge: 2019-09-21 | Payer: PRIVATE HEALTH INSURANCE

## 2019-09-24 ENCOUNTER — Encounter: Admit: 2019-09-24 | Discharge: 2019-09-25 | Payer: PRIVATE HEALTH INSURANCE

## 2019-09-24 DIAGNOSIS — M67819 Other specified disorders of synovium and tendon, unspecified shoulder: Principal | ICD-10-CM

## 2019-09-25 ENCOUNTER — Ambulatory Visit
Admit: 2019-09-25 | Discharge: 2019-09-25 | Attending: Plastic and Reconstructive Surgery | Admitting: Plastic and Reconstructive Surgery

## 2019-09-25 ENCOUNTER — Telehealth
Admit: 2019-09-25 | Discharge: 2019-09-26 | Payer: PRIVATE HEALTH INSURANCE | Attending: Gastroenterology | Primary: Gastroenterology

## 2019-10-07 DIAGNOSIS — Z1509 Genetic susceptibility to other malignant neoplasm: Principal | ICD-10-CM

## 2019-10-07 DIAGNOSIS — Z1501 Genetic susceptibility to malignant neoplasm of breast: Principal | ICD-10-CM

## 2019-10-16 ENCOUNTER — Encounter
Admit: 2019-10-16 | Discharge: 2019-10-17 | Payer: PRIVATE HEALTH INSURANCE | Attending: Student in an Organized Health Care Education/Training Program | Primary: Student in an Organized Health Care Education/Training Program

## 2019-10-30 ENCOUNTER — Encounter
Admit: 2019-10-30 | Discharge: 2019-10-31 | Payer: PRIVATE HEALTH INSURANCE | Attending: Gastroenterology | Primary: Gastroenterology

## 2019-10-30 MED ORDER — PLECANATIDE 3 MG TABLET
ORAL_TABLET | Freq: Every day | ORAL | 2 refills | 0.00000 days | Status: CP
Start: 2019-10-30 — End: 2020-01-28

## 2019-10-30 MED ORDER — OMEPRAZOLE 40 MG CAPSULE,DELAYED RELEASE
ORAL_CAPSULE | Freq: Two times a day (BID) | ORAL | 5 refills | 30 days | Status: CP
Start: 2019-10-30 — End: 2020-04-27

## 2019-10-30 MED ORDER — SUCRALFATE 100 MG/ML ORAL SUSPENSION
Freq: Four times a day (QID) | ORAL | 5 refills | 30.00000 days | Status: CP
Start: 2019-10-30 — End: 2020-04-27

## 2019-10-31 ENCOUNTER — Encounter
Admit: 2019-10-31 | Discharge: 2019-11-01 | Payer: PRIVATE HEALTH INSURANCE | Attending: Student in an Organized Health Care Education/Training Program | Primary: Student in an Organized Health Care Education/Training Program

## 2019-10-31 DIAGNOSIS — J3089 Other allergic rhinitis: Principal | ICD-10-CM

## 2019-10-31 DIAGNOSIS — J4520 Mild intermittent asthma, uncomplicated: Principal | ICD-10-CM

## 2019-10-31 DIAGNOSIS — F902 Attention-deficit hyperactivity disorder, combined type: Principal | ICD-10-CM

## 2019-10-31 DIAGNOSIS — F640 Transsexualism: Principal | ICD-10-CM

## 2019-10-31 DIAGNOSIS — G894 Chronic pain syndrome: Principal | ICD-10-CM

## 2019-10-31 DIAGNOSIS — F411 Generalized anxiety disorder: Principal | ICD-10-CM

## 2019-10-31 DIAGNOSIS — T7800XS Anaphylactic reaction due to unspecified food, sequela: Principal | ICD-10-CM

## 2019-10-31 MED ORDER — OXYCODONE-ACETAMINOPHEN 5 MG-325 MG TABLET
ORAL_TABLET | Freq: Four times a day (QID) | ORAL | 0 refills | 8 days | Status: CP | PRN
Start: 2019-10-31 — End: ?

## 2019-10-31 MED ORDER — EPINEPHRINE 0.3 MG/0.3 ML INJECTION, AUTO-INJECTOR
Freq: Once | SUBCUTANEOUS | 1 refills | 1.00000 days | Status: CP
Start: 2019-10-31 — End: 2019-10-31

## 2019-10-31 MED ORDER — DEXTROAMPHETAMINE-AMPHETAMINE 20 MG TABLET
ORAL_TABLET | Freq: Two times a day (BID) | ORAL | 0 refills | 30 days | Status: CP
Start: 2019-10-31 — End: 2019-11-30

## 2019-10-31 MED ORDER — SYRINGE (DISPOSABLE) 1 ML
0 refills | 0 days | Status: CP
Start: 2019-10-31 — End: ?

## 2019-10-31 MED ORDER — NEEDLE (DISP) 18 GAUGE X 1 1/2"
0 refills | 0 days | Status: CP
Start: 2019-10-31 — End: ?

## 2019-10-31 MED ORDER — FLONASE SENSIMIST 27.5 MCG/ACTUATION NASAL SPRAY,SUSPENSION
Freq: Every day | NASAL | 2 refills | 0 days | Status: CP
Start: 2019-10-31 — End: 2020-10-30

## 2019-10-31 MED ORDER — LEVALBUTEROL HFA 45 MCG/ACTUATION AEROSOL INHALER
RESPIRATORY_TRACT | 2 refills | 0.00000 days | Status: CP | PRN
Start: 2019-10-31 — End: 2020-10-30

## 2019-10-31 MED ORDER — ALCOHOL SWABS
0 refills | 0 days | Status: CP
Start: 2019-10-31 — End: ?

## 2019-10-31 MED ORDER — CLONAZEPAM 0.5 MG TABLET
ORAL_TABLET | Freq: Every day | ORAL | 0 refills | 30.00000 days | Status: CP | PRN
Start: 2019-10-31 — End: ?

## 2019-10-31 MED ORDER — TESTOSTERONE CYPIONATE 200 MG/ML INTRAMUSCULAR OIL
SUBCUTANEOUS | 0 refills | 91.00000 days | Status: CP
Start: 2019-10-31 — End: 2020-01-29

## 2019-10-31 MED ORDER — NEEDLE (DISP) 25 GAUGE X 5/8"
0 refills | 0 days | Status: CP
Start: 2019-10-31 — End: ?

## 2019-11-06 MED ORDER — DEXTROAMPHETAMINE-AMPHETAMINE 20 MG TABLET
ORAL_TABLET | Freq: Every day | ORAL | 0 refills | 30 days | Status: CP
Start: 2019-11-06 — End: 2019-10-31

## 2019-11-08 ENCOUNTER — Encounter: Admit: 2019-11-08 | Discharge: 2019-11-09 | Payer: PRIVATE HEALTH INSURANCE

## 2019-11-11 ENCOUNTER — Ambulatory Visit: Admit: 2019-11-11 | Payer: PRIVATE HEALTH INSURANCE | Attending: Anesthesiology | Primary: Anesthesiology

## 2019-11-15 ENCOUNTER — Ambulatory Visit: Admit: 2019-11-15 | Discharge: 2019-11-15 | Payer: PRIVATE HEALTH INSURANCE

## 2019-11-23 DIAGNOSIS — F640 Transsexualism: Principal | ICD-10-CM

## 2019-11-26 ENCOUNTER — Ambulatory Visit: Admit: 2019-11-26 | Discharge: 2019-11-27 | Payer: PRIVATE HEALTH INSURANCE

## 2019-11-26 ENCOUNTER — Encounter: Admit: 2019-11-26 | Discharge: 2019-11-27 | Payer: PRIVATE HEALTH INSURANCE

## 2019-12-01 MED ORDER — DEXTROAMPHETAMINE-AMPHETAMINE 20 MG TABLET
ORAL_TABLET | Freq: Two times a day (BID) | ORAL | 0 refills | 30 days | Status: CP
Start: 2019-12-01 — End: 2019-12-31

## 2019-12-07 MED ORDER — DEXTROAMPHETAMINE-AMPHETAMINE 20 MG TABLET
ORAL_TABLET | Freq: Every day | ORAL | 0 refills | 30.00000 days | Status: CP
Start: 2019-12-07 — End: 2019-10-31

## 2019-12-18 ENCOUNTER — Ambulatory Visit: Payer: BC Managed Care – PPO | Admitting: Cardiology

## 2019-12-18 ENCOUNTER — Other Ambulatory Visit: Payer: Self-pay

## 2019-12-18 ENCOUNTER — Encounter: Payer: Self-pay | Admitting: Cardiology

## 2019-12-18 VITALS — BP 120/82 | HR 83 | Ht 64.0 in | Wt 164.0 lb

## 2019-12-18 DIAGNOSIS — R072 Precordial pain: Secondary | ICD-10-CM

## 2019-12-18 DIAGNOSIS — R0602 Shortness of breath: Secondary | ICD-10-CM

## 2019-12-18 DIAGNOSIS — R002 Palpitations: Secondary | ICD-10-CM

## 2019-12-18 NOTE — Progress Notes (Signed)
Date:  12/18/2019   ID:  Gary Potts, DOB December 17, 1980, MRN [id number]  PCP:  Sherren Mocha, MD  Cardiologist:  Tessa Lerner, DO, [facility] (established care 12/18/2019)  REASON FOR CONSULT: Chest Pain and Palpitations.  REQUESTING PHYSICIAN:  Sherren Mocha, MD [address] [place],  [place] [address]  Chief Complaint  Patient presents with  . Chest Pain    pt c/o chest pain and fluttering   . Palpitations  . New Patient (Initial Visit)    HPI  Gary Potts is a 39 y.o. adult who presents to the office with a chief complaint of "chest pain, palpitation." He is referred to the office at the request of Sherren Mocha, MD.  No significant past cardiac history.  Palpitations: Patient states that his symptoms of palpitations have been going on for approximately 1-1.5 years.  Symptoms occur on a daily basis, last for few seconds, can have 9-10 episodes per day, associated with lightheadedness at times but no syncopal event. Patient states that the has a tendency of waking up approximately at 1-2 AM because of night sweats and associated symptoms of shortness of breath.  No chest pain.  Episodes.  They last for about 20 minutes, usually has to hold on using ice packs or cold water and the symptoms usually resolve.  Patient does not consume energy drinks, illicit drugs, excessive caffeine intake, no known thyroid disease, no alcohol consumption.  He consumes 1 cup of coffee per day.  He has tried Adderall but currently does not take it.  Chest pain: Patient stated symptoms of chest pain has been going on for 1-1.5 years.  Located substernally, last for about 10 minutes in duration, dull-like sensation, at times radiates to the right shoulder, intermittent, also improved with cooling down, not associated with effort related activities and does not improve with rest.  Patient states that he does have history of GERD, gastroparesis, and also had esophageal dilatation which may contribute to his  symptoms.  But since they have been prolonged he was referred to the office for further evaluation.  Denies prior history of coronary artery disease, myocardial infarction, congestive heart failure, deep venous thrombosis, pulmonary embolism, stroke, transient ischemic attack.  ALLERGIES: Allergies  Allergen Reactions  . Penicillins Anaphylaxis and Rash    Pt can tolerate amoxicillin  Did it involve swelling of the face/tongue/throat, SOB, or low BP? Yes Did it involve sudden or severe rash/hives, skin peeling, or any reaction on the inside of your mouth or nose? No Did you need to seek medical attention at a hospital or doctor's office? Yes When did it last happen?8-9 years If all above answers are "NO", may proceed with cephalosporin use.  . Shellfish Allergy Anaphylaxis and Rash  . Latex Hives and Rash    MEDICATION LIST PRIOR TO VISIT: Current Meds  Medication Sig  . Ascorbic Acid (VITAMIN C) 500 MG CHEW Chew 500 mg by mouth daily.  . budesonide-formoterol (SYMBICORT) 80-4.5 MCG/ACT inhaler Inhale 2 puffs into the lungs 2 (two) times daily. (Patient taking differently: Inhale 2 puffs into the lungs 2 (two) times daily as needed (shortness of breath). )  . clonazePAM (KLONOPIN) 0.5 MG tablet Take 0.5 tablets (0.25 mg total) by mouth every morning AND 1 tablet (0.5 mg total) at bedtime. (Patient taking differently: Take 0.5 mg by mouth twice daily)  . EPINEPHrine (EPIPEN 2-PAK) 0.3 mg/0.3 mL IJ SOAJ injection Use as directed for severe allergic reaction.  . fluticasone (FLONASE)  50 MCG/ACT nasal spray Place 1 spray into both nostrils daily as needed for allergies or rhinitis.  Marland Kitchen. gabapentin (NEURONTIN) 300 MG/6ML solution Take 6-12 mLs (300-600 mg total) by mouth at bedtime.  Marland Kitchen. oxyCODONE (OXY IR/ROXICODONE) 5 MG immediate release tablet Take 1 tablet (5 mg total) by mouth every 6 (six) hours as needed for moderate pain or severe pain.  Marland Kitchen. testosterone cypionate (DEPOTESTOSTERONE  CYPIONATE) 200 MG/ML injection Inject 0.3 mLs (60 mg total) into the muscle once a week. (Patient taking differently: Inject 60 mg into the muscle every Wednesday. )  . TRULANCE 3 MG TABS Take 3 mg by mouth every evening.     PAST MEDICAL HISTORY: Past Medical History:  Diagnosis Date  . ADHD   . Anxiety   . Asthma    seasonal allergy asthma only, rarely uses inhaler  . Gastroparesis   . GERD (gastroesophageal reflux disease)   . Liver cyst 2019   followed by Dr. Pamelia HoitGudena   . Migraines   . Urticaria     PAST SURGICAL HISTORY: Past Surgical History:  Procedure Laterality Date  . BREAST LUMPECTOMY Right 02/07/2019   Procedure: RIGHT BREAST LUMPECTOMY;  Surgeon: Abigail MiyamotoBlackman, Douglas, MD;  [facility];  Service: General;  Laterality: Right;  . CHOLECYSTECTOMY  2014  . CYSTOURETHROSCOPY N/A 08/30/2017   [facility]  . key hole     breast surgery  . OOPHORECTOMY Right 2005   found to be PARTIAL right oopherectomy only during 08/2017 total hyst w/ BSO  . TOTAL LAPAROSCOPIC HYSTERECTOMY WITH SALPINGECTOMY Bilateral 08/30/2017   [facility]; small 1 cm Rt ovarian reminant; no cervical, endometrial, B ovarian, or B fallopian tube pathologic diagnosis Potts atypia, dysplasia, or malignancy; no need for continued pap screening    FAMILY HISTORY: The patient family history includes Allergic rhinitis in his mother; Breast cancer in his maternal grandmother, mother, and sister; Ovarian cancer in his maternal grandmother; Sinusitis in his mother and sister; Urticaria in his sister.  SOCIAL HISTORY:  The patient  reports that he has never smoked. He has never used smokeless tobacco. He reports previous alcohol use. He reports that he does not use drugs.  REVIEW OF SYSTEMS: Review of Systems  Constitutional: Negative for chills and fever.  HENT: Negative for hoarse voice and nosebleeds.     Eyes: Negative for discharge, double vision and pain.  Cardiovascular: Positive for chest pain and palpitations. Negative for claudication, dyspnea on exertion, leg swelling, near-syncope, orthopnea, paroxysmal nocturnal dyspnea and syncope.  Respiratory: Negative for hemoptysis and shortness of breath.   Endocrine:       Night sweats.  Musculoskeletal: Negative for muscle cramps and myalgias.  Gastrointestinal: Negative for abdominal pain, constipation, diarrhea, hematemesis, hematochezia, melena, nausea and vomiting.  Neurological: Negative for dizziness and light-headedness.    PHYSICAL EXAM: Vitals with BMI 12/18/2019 02/07/2019 02/07/2019  Height 5\' 4"  - -  Weight 164 lbs - -  BMI 28.14 - -  Systolic 120 143 [id number]  Diastolic 82 84 82  Pulse 83 86 103    CONSTITUTIONAL: Well-developed and well-nourished. No acute distress.  SKIN: Skin is warm and dry. No rash noted. No cyanosis. No pallor. No jaundice HEAD: Normocephalic and atraumatic.  EYES: No scleral icterus MOUTH/THROAT: Moist oral membranes.  NECK: No JVD present. No thyromegaly noted. No carotid bruits  LYMPHATIC: No visible cervical adenopathy.  CHEST Normal respiratory effort. No intercostal retractions  LUNGS: Clear to auscultation bilaterally no stridor. No wheezes. No rales.  CARDIOVASCULAR: Regular, positive S1-S2, no murmurs rubs or gallops appreciated. ABDOMINAL: No apparent ascites.  EXTREMITIES: No peripheral edema  HEMATOLOGIC: No significant bruising NEUROLOGIC: Oriented to person, place, and time. Nonfocal. Normal muscle tone.  PSYCHIATRIC: Normal mood and affect. Normal behavior. Cooperative  CARDIAC DATABASE: EKG: 12/18/2019: Normal sinus rhythm, 80 bpm, normal axis, nonspecific T wave abnormalities.  Similar findings on prior ECG.  Echocardiogram: None  Stress Testing: None  Heart Catheterization: None  LABORATORY DATA: CBC Latest Ref Rng & Units 02/07/2019 02/01/2019 06/12/2018  WBC 4.0 - 10.5  K/uL 4.4 - 6.5  Hemoglobin 13.0 - 17.0 g/dL 67.6 72.0 94.7  Hematocrit 39 - 52 % 45.8 - 43.8  Platelets 150 - 400 K/uL 354 - 332    CMP Latest Ref Rng & Units 02/07/2019 06/12/2018 01/18/2018  Glucose 70 - 99 mg/dL 76 81 84  BUN 6 - 20 mg/dL 7 6 13   Creatinine 0.61 - 1.24 mg/dL 0.96 2.83  Sodium 135 - 145 mmol/L 137 140 140  Potassium 3.5 - 5.1 mmol/L 3.9 4.5 4.7  Chloride 98 - 111 mmol/L 101 100 99  CO2 22 - 32 mmol/L 27 23 26   Calcium 8.9 - 10.3 mg/dL 9.1 9.7 9.9  Total Protein 6.5 - 8.1 g/dL 6.8 6.6 7.7  Total Bilirubin 0.3 - 1.2 mg/dL 0.7 0.3 0.3  Alkaline Phos 38 - 126 U/L 86 101 98  AST 15 - 41 U/L 21 21 21   ALT 0 - 44 U/L 13 13 15     Lipid Panel     Component Value Date/Time   CHOL 191 09/23/2017 1107   TRIG 62 09/23/2017 1107   HDL 55 09/23/2017 1107   CHOLHDL 3.5 09/23/2017 1107   LDLCALC 124 (H) 09/23/2017 1107   LABVLDL 12 09/23/2017 1107    No components found for: NTPROBNP No results for input(s): PROBNP in the last 8760 hours. No results for input(s): TSH in the last 8760 hours.  BMP Recent Labs    02/07/19 0903  NA 137  K 3.9  CL 101  CO2 27  GLUCOSE 76  BUN 7  CREATININE 1.24  CALCIUM 9.1  GFRNONAA >60  GFRAA >60    HEMOGLOBIN A1C Lab Results  Component Value Date   HGBA1C 5.4 01/27/2018    IMPRESSION:    ICD-10-CM   1. Precordial pain  R07.2 EKG 12-Lead    PCV CARDIAC STRESS TEST  2. Shortness of breath  R06.02 PCV CARDIAC STRESS TEST    PCV ECHOCARDIOGRAM COMPLETE  3. Palpitations  R00.2 HOLTER MONITOR - 24 HOUR     RECOMMENDATIONS: Gary Potts is a 39 y.o. adult without any significant cardiac history.  Precordial chest pain:  Patient symptoms of chest discomfort appear to be atypical in nature.  EKG does not show any signs of underlying injury pattern.  Echocardiogram will be ordered to evaluate for structural heart disease and left ventricular systolic function.  Plan exercise treadmill stress test to  evaluate for exercise-induced ischemia.  Shortness of breath: See above  Palpitations:  Check 24-hour Holter monitor to evaluate for underlying arrhythmic burden.  FINAL MEDICATION LIST END OF ENCOUNTER: No orders of the defined types were placed in this encounter.   Medications Discontinued During This Encounter  Medication Reason  . diclofenac sodium (VOLTAREN) 1 % GEL Completed Course  . metoCLOPramide (REGLAN) 5 MG tablet Completed Course  . pantoprazole (PROTONIX) 40 MG tablet Completed  Course     Current Outpatient Medications:  .  Ascorbic Acid (VITAMIN C) 500 MG CHEW, Chew 500 mg by mouth daily., Disp: , Rfl:  .  budesonide-formoterol (SYMBICORT) 80-4.5 MCG/ACT inhaler, Inhale 2 puffs into the lungs 2 (two) times daily. (Patient taking differently: Inhale 2 puffs into the lungs 2 (two) times daily as needed (shortness of breath). ), Disp: 1 Inhaler, Rfl: 3 .  clonazePAM (KLONOPIN) 0.5 MG tablet, Take 0.5 tablets (0.25 mg total) by mouth every morning AND 1 tablet (0.5 mg total) at bedtime. (Patient taking differently: Take 0.5 mg by mouth twice daily), Disp: 45 tablet, Rfl: 0 .  EPINEPHrine (EPIPEN 2-PAK) 0.3 mg/0.3 mL IJ SOAJ injection, Use as directed for severe allergic reaction., Disp: 2 Device, Rfl: 1 .  fluticasone (FLONASE) 50 MCG/ACT nasal spray, Place 1 spray into both nostrils daily as needed for allergies or rhinitis., Disp: , Rfl:  .  gabapentin (NEURONTIN) 300 MG/6ML solution, Take 6-12 mLs (300-600 mg total) by mouth at bedtime., Disp: 360 mL, Rfl: 2 .  oxyCODONE (OXY IR/ROXICODONE) 5 MG immediate release tablet, Take 1 tablet (5 mg total) by mouth every 6 (six) hours as needed for moderate pain or severe pain., Disp: 20 tablet, Rfl: 0 .  testosterone cypionate (DEPOTESTOSTERONE CYPIONATE) 200 MG/ML injection, Inject 0.3 mLs (60 mg total) into the muscle once a week. (Patient taking differently: Inject 60 mg into the muscle every Wednesday. ), Disp: 2 mL, Rfl: 5 .   TRULANCE 3 MG TABS, Take 3 mg by mouth every evening., Disp: , Rfl:  .  amphetamine-dextroamphetamine (ADDERALL) 20 MG tablet, Take 1 tablet (20 mg total) by mouth 2 (two) times daily. (Patient not taking: Reported on 12/18/2019), Disp: 60 tablet, Rfl: 0  Orders Placed This Encounter  Procedures  . PCV CARDIAC STRESS TEST  . HOLTER MONITOR - 24 HOUR  . EKG 12-Lead  . PCV ECHOCARDIOGRAM COMPLETE   There are no Patient Instructions on file for this visit.   --Continue cardiac medications as reconciled in final medication list. --Return in about 6 weeks (around 01/29/2020) for re-evaluation of symptoms., review test results.. Or sooner if needed. --Continue follow-up with your primary care physician regarding the management of your other chronic comorbid conditions.  Patient's questions and concerns were addressed to his satisfaction. He voices understanding of the instructions provided during this encounter.   This note was created using a voice recognition software as a result there may be grammatical errors inadvertently enclosed that do not reflect the nature of this encounter. Every attempt is made to correct such errors.  Tessa Lerner, [place], [facility]  Pager: [phone] Office: [phone]

## 2019-12-20 ENCOUNTER — Encounter: Payer: Self-pay | Admitting: Family Medicine

## 2019-12-20 ENCOUNTER — Ambulatory Visit: Payer: BLUE CROSS/BLUE SHIELD

## 2019-12-20 ENCOUNTER — Other Ambulatory Visit: Payer: Self-pay

## 2019-12-20 DIAGNOSIS — R0602 Shortness of breath: Secondary | ICD-10-CM

## 2019-12-20 DIAGNOSIS — R002 Palpitations: Secondary | ICD-10-CM

## 2019-12-25 ENCOUNTER — Encounter: Admit: 2019-12-25 | Payer: PRIVATE HEALTH INSURANCE | Attending: Gastroenterology | Primary: Gastroenterology

## 2019-12-31 MED ORDER — DEXTROAMPHETAMINE-AMPHETAMINE 20 MG TABLET
ORAL_TABLET | Freq: Two times a day (BID) | ORAL | 0 refills | 30.00000 days | Status: CP
Start: 2019-12-31 — End: 2020-01-30

## 2020-01-01 ENCOUNTER — Ambulatory Visit: Payer: BLUE CROSS/BLUE SHIELD

## 2020-01-01 ENCOUNTER — Other Ambulatory Visit: Payer: Self-pay

## 2020-01-01 DIAGNOSIS — R0602 Shortness of breath: Secondary | ICD-10-CM

## 2020-01-01 DIAGNOSIS — R072 Precordial pain: Secondary | ICD-10-CM

## 2020-01-06 MED ORDER — DEXTROAMPHETAMINE-AMPHETAMINE 20 MG TABLET
ORAL_TABLET | Freq: Every day | ORAL | 0 refills | 30.00000 days | Status: CP
Start: 2020-01-06 — End: 2019-10-31

## 2020-01-09 ENCOUNTER — Encounter
Admit: 2020-01-09 | Discharge: 2020-01-09 | Payer: PRIVATE HEALTH INSURANCE | Attending: Anesthesiology | Primary: Anesthesiology

## 2020-01-09 ENCOUNTER — Encounter: Admit: 2020-01-09 | Discharge: 2020-01-09 | Payer: PRIVATE HEALTH INSURANCE

## 2020-01-09 MED ORDER — ACETAMINOPHEN 500 MG TABLET
ORAL_TABLET | Freq: Four times a day (QID) | ORAL | 0 refills | 8.00000 days | Status: CP
Start: 2020-01-09 — End: ?
  Filled 2020-01-09: qty 20, 3d supply, fill #0
  Filled 2020-01-09: qty 30, 8d supply, fill #0

## 2020-01-09 MED ORDER — GABAPENTIN 100 MG CAPSULE
ORAL_CAPSULE | Freq: Three times a day (TID) | ORAL | 0 refills | 30.00000 days | Status: CP
Start: 2020-01-09 — End: 2020-02-08
  Filled 2020-01-09: qty 90, 30d supply, fill #0

## 2020-01-09 MED ORDER — OXYCODONE 5 MG TABLET
ORAL_TABLET | ORAL | 0 refills | 4.00000 days | Status: CP | PRN
Start: 2020-01-09 — End: 2020-01-14

## 2020-01-09 MED ORDER — CYCLOBENZAPRINE 5 MG TABLET
ORAL_TABLET | Freq: Three times a day (TID) | ORAL | 0 refills | 7.00000 days | Status: CP
Start: 2020-01-09 — End: 2020-01-16
  Filled 2020-01-09: qty 21, 7d supply, fill #0

## 2020-01-09 MED FILL — GABAPENTIN 100 MG CAPSULE: 30 days supply | Qty: 90 | Fill #0 | Status: AC

## 2020-01-09 MED FILL — ACETAMINOPHEN 500 MG TABLET: 8 days supply | Qty: 30 | Fill #0 | Status: AC

## 2020-01-09 MED FILL — CYCLOBENZAPRINE 5 MG TABLET: 7 days supply | Qty: 21 | Fill #0 | Status: AC

## 2020-01-09 MED FILL — OXYCODONE 5 MG TABLET: 3 days supply | Qty: 20 | Fill #0 | Status: AC

## 2020-01-17 ENCOUNTER — Encounter
Admit: 2020-01-17 | Discharge: 2020-01-18 | Payer: PRIVATE HEALTH INSURANCE | Attending: Plastic and Reconstructive Surgery | Primary: Plastic and Reconstructive Surgery

## 2020-01-17 DIAGNOSIS — Z1509 Genetic susceptibility to other malignant neoplasm: Secondary | ICD-10-CM

## 2020-01-17 DIAGNOSIS — Z1501 Genetic susceptibility to malignant neoplasm of breast: Principal | ICD-10-CM

## 2020-01-22 ENCOUNTER — Encounter
Admit: 2020-01-22 | Discharge: 2020-01-23 | Payer: PRIVATE HEALTH INSURANCE | Attending: Sports Medicine | Primary: Sports Medicine

## 2020-01-22 MED ORDER — SYRINGE (DISPOSABLE) 2.5 ML
INJECTION | 2 refills | 0 days | Status: CP
Start: 2020-01-22 — End: ?

## 2020-01-22 MED ORDER — TESTOSTERONE CYPIONATE 200 MG/ML INTRAMUSCULAR OIL
SUBCUTANEOUS | 0 refills | 91.00000 days | Status: CP
Start: 2020-01-22 — End: 2020-04-21

## 2020-01-23 DIAGNOSIS — K5900 Constipation, unspecified: Principal | ICD-10-CM

## 2020-01-23 MED ORDER — TESTOSTERONE CYPIONATE 200 MG/ML INTRAMUSCULAR OIL
SUBCUTANEOUS | 0 refills | 112.00000 days | Status: CP
Start: 2020-01-23 — End: 2020-04-22

## 2020-01-24 MED ORDER — ESOMEPRAZOLE MAGNESIUM 40 MG CAPSULE,DELAYED RELEASE
ORAL_CAPSULE | Freq: Two times a day (BID) | ORAL | 5 refills | 30 days | Status: CP
Start: 2020-01-24 — End: 2020-07-22

## 2020-01-27 DIAGNOSIS — G43811 Other migraine, intractable, with status migrainosus: Principal | ICD-10-CM

## 2020-01-27 DIAGNOSIS — H538 Other visual disturbances: Principal | ICD-10-CM

## 2020-01-27 DIAGNOSIS — M5481 Occipital neuralgia: Principal | ICD-10-CM

## 2020-01-29 ENCOUNTER — Ambulatory Visit
Admit: 2020-01-29 | Discharge: 2020-01-30 | Payer: PRIVATE HEALTH INSURANCE | Attending: Plastic and Reconstructive Surgery | Primary: Plastic and Reconstructive Surgery

## 2020-01-30 ENCOUNTER — Encounter: Payer: Self-pay | Admitting: Cardiology

## 2020-01-30 ENCOUNTER — Other Ambulatory Visit: Payer: Self-pay

## 2020-01-30 ENCOUNTER — Ambulatory Visit: Payer: BLUE CROSS/BLUE SHIELD | Admitting: Cardiology

## 2020-01-30 VITALS — BP 125/77 | HR 77 | Resp 16 | Ht 64.0 in | Wt 167.0 lb

## 2020-01-30 DIAGNOSIS — R072 Precordial pain: Secondary | ICD-10-CM

## 2020-01-30 DIAGNOSIS — R002 Palpitations: Secondary | ICD-10-CM

## 2020-01-30 DIAGNOSIS — Z712 Person consulting for explanation of examination or test findings: Secondary | ICD-10-CM

## 2020-01-30 MED ORDER — DEXTROAMPHETAMINE-AMPHETAMINE 20 MG TABLET
ORAL_TABLET | Freq: Two times a day (BID) | ORAL | 0 refills | 30.00000 days | Status: CP
Start: 2020-01-30 — End: 2020-02-29

## 2020-01-30 NOTE — Progress Notes (Signed)
Date:  01/30/2020   ID:  Gary Potts, DOB 06-02-1981, MRN [id number]  PCP:  Sherren Mocha, MD  Cardiologist:  Tessa Lerner, DO, [facility] (established care 12/18/2019)  Date: 01/30/20 Last Office Visit: 12/18/2019  Chief Complaint  Patient presents with  . Precordial pain  . Follow-up    6 week    HPI  Gary Potts is a 39 y.o. adult who presents to the office with a chief complaint of "reevaluation of chest pain/palpitations and review test results."  Palpitations: Please refer to the initial consultation regards to the characteristics of his palpitations. Since last office visit patient states that his palpitations overall improved but still present. He did wear 24 Holter monitor which noted his underlying rhythm to be sinus. No noted dysrhythmias during the monitoring period.  Chest pain: At the last office visit patient was having symptoms of atypical chest pain. He was referred to the office for additional evaluation and management given his complex history. Patient did undergo an echocardiogram which noted preserved LVEF. He also underwent an exercise treadmill stress test which reported that he achieved 90% of age predicted maximum heart rate and 7.2 METS on Bruce protocol. Stress ECG revealed no ischemic changes. But he was noted to have low exercise capacity for age and had symptoms of chest pain, dyspnea and dizziness.   Patient states that his symptoms of atypical chest pain still continue. Since last office visit he is also had some blood work done at [facility] and states that his TSH may not be within normal limits as well as his hemoglobin and cholesterol levels. I tried to review the lab work from care everywhere but was unable to do so.   Of note, patient states that he does have history of GERD, gastroparesis, and also had esophageal dilatation which may contribute to his symptoms.    Denies prior history of coronary artery disease, myocardial  infarction, congestive heart failure, deep venous thrombosis, pulmonary embolism, stroke, transient ischemic attack.  ALLERGIES: Allergies  Allergen Reactions  . Penicillins Anaphylaxis and Rash    Pt can tolerate amoxicillin  Did it involve swelling of the face/tongue/throat, SOB, or low BP? Yes Did it involve sudden or severe rash/hives, skin peeling, or any reaction on the inside of your mouth or nose? No Did you need to seek medical attention at a hospital or doctor's office? Yes When did it last happen?8-9 years If all above answers are "NO", may proceed with cephalosporin use.  . Shellfish Allergy Anaphylaxis and Rash  . Latex Hives and Rash    MEDICATION LIST PRIOR TO VISIT: Current Meds  Medication Sig  . amphetamine-dextroamphetamine (ADDERALL) 20 MG tablet Take 1 tablet (20 mg total) by mouth 2 (two) times daily.  . Ascorbic Acid (VITAMIN C) 500 MG CHEW Chew 500 mg by mouth daily.  . budesonide-formoterol (SYMBICORT) 80-4.5 MCG/ACT inhaler Inhale 2 puffs into the lungs 2 (two) times daily. (Patient taking differently: Inhale 2 puffs into the lungs 2 (two) times daily as needed (shortness of breath). )  . clonazePAM (KLONOPIN) 0.5 MG tablet Take 0.5 tablets (0.25 mg total) by mouth every morning AND 1 tablet (0.5 mg total) at bedtime. (Patient taking differently: Take 0.5 mg by mouth twice daily)  . EPINEPHrine (EPIPEN 2-PAK) 0.3 mg/0.3 mL IJ SOAJ injection Use as directed for severe allergic reaction.  . fluticasone (FLONASE) 50 MCG/ACT nasal spray Place 1 spray into both nostrils daily as needed for allergies or rhinitis.  Marland Kitchen  gabapentin (NEURONTIN) 300 MG/6ML solution Take 6-12 mLs (300-600 mg total) by mouth at bedtime.  Marland Kitchen. oxyCODONE (OXY IR/ROXICODONE) 5 MG immediate release tablet Take 1 tablet (5 mg total) by mouth every 6 (six) hours as needed for moderate pain or severe pain.  Marland Kitchen. testosterone cypionate (DEPOTESTOSTERONE CYPIONATE) 200 MG/ML injection Inject 0.3 mLs (60  mg total) into the muscle once a week. (Patient taking differently: Inject 60 mg into the muscle every Wednesday. )  . TRULANCE 3 MG TABS Take 3 mg by mouth every evening.     PAST MEDICAL HISTORY: Past Medical History:  Diagnosis Date  . ADHD   . Anxiety   . Asthma    seasonal allergy asthma only, rarely uses inhaler  . Gastroparesis   . GERD (gastroesophageal reflux disease)   . Liver cyst 2019   followed by Dr. Pamelia HoitGudena   . Migraines   . Urticaria     PAST SURGICAL HISTORY: Past Surgical History:  Procedure Laterality Date  . BREAST LUMPECTOMY Right 02/07/2019   Procedure: RIGHT BREAST LUMPECTOMY;  Surgeon: Abigail MiyamotoBlackman, Douglas, MD;  [facility];  Service: General;  Laterality: Right;  . CHOLECYSTECTOMY  2014  . CYSTOURETHROSCOPY N/A 08/30/2017   [facility]  . key hole     breast surgery  . OOPHORECTOMY Right 2005   found to be PARTIAL right oopherectomy only during 08/2017 total hyst w/ BSO  . TOTAL LAPAROSCOPIC HYSTERECTOMY WITH SALPINGECTOMY Bilateral 08/30/2017   [facility]. Bryn [facility]ullingMichelle Louie [facility]; small 1 cm Rt ovarian reminant; no cervical, endometrial, B ovarian, or B fallopian tube pathologic diagnosis inc atypia, dysplasia, or malignancy; no need for continued pap screening    FAMILY HISTORY: The patient family history includes Allergic rhinitis in his mother; Breast cancer in his maternal grandmother, mother, and sister; Ovarian cancer in his maternal grandmother; Sinusitis in his mother and sister; Urticaria in his sister.  SOCIAL HISTORY:  The patient  reports that he has never smoked. He has never used smokeless tobacco. He reports previous alcohol use. He reports that he does not use drugs.  REVIEW OF SYSTEMS: Review of Systems  Constitutional: Negative for chills and fever.  HENT: Negative for hoarse voice and nosebleeds.   Eyes: Negative for discharge, double vision and  pain.  Cardiovascular: Positive for chest pain and palpitations. Negative for claudication, dyspnea on exertion, leg swelling, near-syncope, orthopnea, paroxysmal nocturnal dyspnea and syncope.  Respiratory: Negative for hemoptysis and shortness of breath.   Endocrine:       Night sweats.  Musculoskeletal: Negative for muscle cramps and myalgias.  Gastrointestinal: Negative for abdominal pain, constipation, diarrhea, hematemesis, hematochezia, melena, nausea and vomiting.  Neurological: Negative for dizziness and light-headedness.    PHYSICAL EXAM: Vitals with BMI 01/30/2020 12/18/2019 02/07/2019  Height 5\' 4"  5\' 4"  -  Weight 167 lbs 164 lbs -  BMI 28.65 28.14 -  Systolic 125 120 [id number]  Diastolic 77 82 84  Pulse 77 83 86    CONSTITUTIONAL: Well-developed and well-nourished. No acute distress.  SKIN: Skin is warm and dry. No rash noted. No cyanosis. No pallor. No jaundice HEAD: Normocephalic and atraumatic.  EYES: No scleral icterus MOUTH/THROAT: Moist oral membranes.  NECK: No JVD present. No thyromegaly noted. No carotid bruits  LYMPHATIC: No visible cervical adenopathy.  CHEST Normal respiratory effort. No intercostal retractions  LUNGS: Clear to auscultation bilaterally no stridor. No wheezes. No rales.  CARDIOVASCULAR: Regular, positive S1-S2, no  murmurs rubs or gallops appreciated. ABDOMINAL: No apparent ascites.  EXTREMITIES: No peripheral edema  HEMATOLOGIC: No significant bruising NEUROLOGIC: Oriented to person, place, and time. Nonfocal. Normal muscle tone.  PSYCHIATRIC: Normal mood and affect. Normal behavior. Cooperative  CARDIAC DATABASE: EKG: 12/18/2019: Normal sinus rhythm, 80 bpm, normal axis, nonspecific T wave abnormalities.  Similar findings on prior ECG.  Echocardiogram: 12/20/2019: Normal LV systolic function with EF 55%. Left ventricle cavity is normal in size. Normal global wall motion. Normal diastolic filling pattern. Calculated EF 55%. Structurally normal  mitral valve.  Mild (Grade I)  anteriorly directed mitral regurgitation. No obvious restriction or prolapse of the MV leaflets. Structurally normal tricuspid valve.  Mild tricuspid regurgitation. No evidence of pulmonary hypertension.  Stress Testing: Exercise treadmill stress test performed using Bruce protocol.   Patient reached 7.2 METS, and 98% of age predicted maximum heart rate.  Exercise capacity was low.  Patient reported chest pain, dyspnea, dizziness.  Normal heart rate and hemodynamic response. Stress EKG revealed no ischemic changes. Normal exercise treadmill EKG with low exercise tolerance. Recommend clinical correlation.   Heart Catheterization: None  24 hour Holter monitor: Dominant rhythm normal sinus rhythm followed by sinus tachycardia (5.33% burden). Heart rate 53-135 bpm.  Average heart rate 82 bpm. No atrial fibrillation/atrial flutter/supraventricular tachycardia/ventricular tachycardia/high grade AV block, sinus pause greater than or equal to 3 seconds in duration. Total ventricular ectopic burden <0.01%. Total supraventricular ectopic burden 0%. Number of patient triggered events: 0.     LABORATORY DATA: CBC Latest Ref Rng & Units 02/07/2019 02/01/2019 06/12/2018  WBC 4.0 - 10.5 K/uL 4.4 - 6.5  Hemoglobin 13.0 - 17.0 g/dL 57.8 46.9 62.9  Hematocrit 39 - 52 % 45.8 - 43.8  Platelets 150 - 400 K/uL 354 - 332    CMP Latest Ref Rng & Units 02/07/2019 06/12/2018 01/18/2018  Glucose 70 - 99 mg/dL 76 81 84  BUN 6 - 20 mg/dL 7 6 13   Creatinine 0.61 - 1.24 mg/dL 5.28 4.13  Sodium 135 - 145 mmol/L 137 140 140  Potassium 3.5 - 5.1 mmol/L 3.9 4.5 4.7  Chloride 98 - 111 mmol/L 101 100 99  CO2 22 - 32 mmol/L 27 23 26   Calcium 8.9 - 10.3 mg/dL 9.1 9.7 9.9  Total Protein 6.5 - 8.1 g/dL 6.8 6.6 7.7  Total Bilirubin 0.3 - 1.2 mg/dL 0.7 0.3 0.3  Alkaline Phos 38 - 126 U/L 86 101 98  AST 15 - 41 U/L 21 21 21   ALT 0 - 44 U/L 13 13 15     Lipid Panel     Component Value  Date/Time   CHOL 191 09/23/2017 1107   TRIG 62 09/23/2017 1107   HDL 55 09/23/2017 1107   CHOLHDL 3.5 09/23/2017 1107   LDLCALC 124 (H) 09/23/2017 1107   LABVLDL 12 09/23/2017 1107    No components found for: NTPROBNP No results for input(s): PROBNP in the last 8760 hours. No results for input(s): TSH in the last 8760 hours.  BMP Recent Labs    02/07/19 0903  NA 137  K 3.9  CL 101  CO2 27  GLUCOSE 76  BUN 7  CREATININE 1.24  CALCIUM 9.1  GFRNONAA >60  GFRAA >60    HEMOGLOBIN A1C Lab Results  Component Value Date   HGBA1C 5.4 01/27/2018    IMPRESSION:    ICD-10-CM   1. Precordial pain  R07.2 CT CARDIAC SCORING  2. Palpitations  R00.2   3. Encounter to discuss test results  Z71.2      RECOMMENDATIONS: Gary Potts is a 39 y.o. adult without any significant cardiac history.  Precordial chest pain:  Reviewed the results of the echocardiogram and exercise treadmill stress test with the patient.  I did reassure him that the work-up so far does not appear that his atypical chest pain is arising from coronary artery disease. However, patient continues to be concerned.  Due to the atypical chest discomfort which is concerning to him the shared decision was to proceed with coronary artery calcification score for further risk stratification.    Patient did have lipid profile done at [facility] which he will bring at the next office visit.  Results are not available in care everywhere.    If the symptoms increase in intensity, frequency, and/or duration or has typical discomfort as discussed in the office he is asked to seek medical attention at the closest ER via EMS.    Palpitations:  Overall stable.  24 Hour Holter monitor results reviewed.  No evidence of atrial fibrillation or dysrhythmias.  Continue to monitor.  Patient also notes that his TSH level is not within normal limits.  I have asked him to have this followed up with his PCP.  I do not  have the most recent labs to review during this encounter.  Patient is currently on hormone replacement therapy and states that his testosterone level is above normal limits as well as his hemoglobin and hematocrit.  He stated that his TSH is also not within normal limits. I do not have the most recent labs to review therefore, he is asked to follow-up with his PCP and other specialist to address the laboratory findings.  FINAL MEDICATION LIST END OF ENCOUNTER: No orders of the defined types were placed in this encounter.   There are no discontinued medications.   Current Outpatient Medications:  .  amphetamine-dextroamphetamine (ADDERALL) 20 MG tablet, Take 1 tablet (20 mg total) by mouth 2 (two) times daily., Disp: 60 tablet, Rfl: 0 .  Ascorbic Acid (VITAMIN C) 500 MG CHEW, Chew 500 mg by mouth daily., Disp: , Rfl:  .  budesonide-formoterol (SYMBICORT) 80-4.5 MCG/ACT inhaler, Inhale 2 puffs into the lungs 2 (two) times daily. (Patient taking differently: Inhale 2 puffs into the lungs 2 (two) times daily as needed (shortness of breath). ), Disp: 1 Inhaler, Rfl: 3 .  clonazePAM (KLONOPIN) 0.5 MG tablet, Take 0.5 tablets (0.25 mg total) by mouth every morning AND 1 tablet (0.5 mg total) at bedtime. (Patient taking differently: Take 0.5 mg by mouth twice daily), Disp: 45 tablet, Rfl: 0 .  EPINEPHrine (EPIPEN 2-PAK) 0.3 mg/0.3 mL IJ SOAJ injection, Use as directed for severe allergic reaction., Disp: 2 Device, Rfl: 1 .  fluticasone (FLONASE) 50 MCG/ACT nasal spray, Place 1 spray into both nostrils daily as needed for allergies or rhinitis., Disp: , Rfl:  .  gabapentin (NEURONTIN) 300 MG/6ML solution, Take 6-12 mLs (300-600 mg total) by mouth at bedtime., Disp: 360 mL, Rfl: 2 .  oxyCODONE (OXY IR/ROXICODONE) 5 MG immediate release tablet, Take 1 tablet (5 mg total) by mouth every 6 (six) hours as needed for moderate pain or severe pain., Disp: 20 tablet, Rfl: 0 .  testosterone cypionate  (DEPOTESTOSTERONE CYPIONATE) 200 MG/ML injection, Inject 0.3 mLs (60 mg total) into the muscle once a week. (Patient taking differently: Inject 60 mg into the muscle every Wednesday. ), Disp: 2 mL, Rfl: 5 .  TRULANCE 3 MG TABS, Take 3 mg by  mouth every evening., Disp: , Rfl:   Orders Placed This Encounter  Procedures  . CT CARDIAC SCORING   There are no Patient Instructions on file for this visit.   --Continue cardiac medications as reconciled in final medication list. --Return in about 4 weeks (around 02/27/2020) for review test results., re-evaluation of symptoms.. Or sooner if needed. --Continue follow-up with your primary care physician regarding the management of your other chronic comorbid conditions.  Patient's questions and concerns were addressed to his satisfaction. He voices understanding of the instructions provided during this encounter.   This note was created using a voice recognition software as a result there may be grammatical errors inadvertently enclosed that do not reflect the nature of this encounter. Every attempt is made to correct such errors.  Tessa Lerner, [place], [facility]  Pager: [phone] Office: [phone]

## 2020-02-10 ENCOUNTER — Encounter: Admit: 2020-02-10 | Discharge: 2020-02-11 | Payer: PRIVATE HEALTH INSURANCE

## 2020-02-12 ENCOUNTER — Ambulatory Visit
Admit: 2020-02-12 | Discharge: 2020-02-13 | Payer: PRIVATE HEALTH INSURANCE | Attending: Plastic and Reconstructive Surgery | Primary: Plastic and Reconstructive Surgery

## 2020-02-20 DIAGNOSIS — R202 Paresthesia of skin: Principal | ICD-10-CM

## 2020-02-20 DIAGNOSIS — R200 Anesthesia of skin: Principal | ICD-10-CM

## 2020-02-20 DIAGNOSIS — G43811 Other migraine, intractable, with status migrainosus: Principal | ICD-10-CM

## 2020-02-20 DIAGNOSIS — R9082 White matter disease, unspecified: Principal | ICD-10-CM

## 2020-02-25 DIAGNOSIS — G379 Demyelinating disease of central nervous system, unspecified: Principal | ICD-10-CM

## 2020-02-27 ENCOUNTER — Ambulatory Visit: Payer: BLUE CROSS/BLUE SHIELD | Admitting: Cardiology

## 2020-03-02 MED ORDER — DEXTROAMPHETAMINE-AMPHETAMINE 20 MG TABLET
ORAL_TABLET | Freq: Two times a day (BID) | ORAL | 0 refills | 30 days | Status: CP
Start: 2020-03-02 — End: 2020-04-01

## 2020-03-04 ENCOUNTER — Ambulatory Visit
Admit: 2020-03-04 | Discharge: 2020-03-05 | Payer: PRIVATE HEALTH INSURANCE | Attending: Plastic and Reconstructive Surgery | Primary: Plastic and Reconstructive Surgery

## 2020-03-04 DIAGNOSIS — Z1501 Genetic susceptibility to malignant neoplasm of breast: Principal | ICD-10-CM

## 2020-03-04 DIAGNOSIS — Z1509 Genetic susceptibility to other malignant neoplasm: Principal | ICD-10-CM

## 2020-03-11 ENCOUNTER — Ambulatory Visit: Admit: 2020-03-11 | Discharge: 2020-03-12 | Payer: PRIVATE HEALTH INSURANCE

## 2020-03-18 ENCOUNTER — Encounter: Admit: 2020-03-18 | Payer: PRIVATE HEALTH INSURANCE | Attending: Gastroenterology | Primary: Gastroenterology

## 2020-04-01 MED ORDER — DEXTROAMPHETAMINE-AMPHETAMINE 20 MG TABLET
ORAL_TABLET | Freq: Two times a day (BID) | ORAL | 0 refills | 30 days | Status: CP
Start: 2020-04-01 — End: 2020-05-01

## 2020-04-03 DIAGNOSIS — G729 Myopathy, unspecified: Principal | ICD-10-CM

## 2020-04-07 ENCOUNTER — Encounter
Admit: 2020-04-07 | Payer: PRIVATE HEALTH INSURANCE | Attending: Plastic and Reconstructive Surgery | Primary: Plastic and Reconstructive Surgery

## 2020-04-30 ENCOUNTER — Ambulatory Visit: Admit: 2020-04-30 | Discharge: 2020-05-01 | Payer: PRIVATE HEALTH INSURANCE

## 2020-04-30 DIAGNOSIS — Q0700 Arnold-Chiari syndrome without spina bifida or hydrocephalus: Principal | ICD-10-CM

## 2020-05-01 DIAGNOSIS — Z789 Other specified health status: Principal | ICD-10-CM

## 2020-05-06 DIAGNOSIS — F902 Attention-deficit hyperactivity disorder, combined type: Principal | ICD-10-CM

## 2020-05-07 DIAGNOSIS — Z789 Other specified health status: Principal | ICD-10-CM

## 2020-05-07 DIAGNOSIS — F902 Attention-deficit hyperactivity disorder, combined type: Principal | ICD-10-CM

## 2020-05-07 DIAGNOSIS — F411 Generalized anxiety disorder: Principal | ICD-10-CM

## 2020-05-07 DIAGNOSIS — G894 Chronic pain syndrome: Principal | ICD-10-CM

## 2020-05-07 MED ORDER — OXYCODONE-ACETAMINOPHEN 5 MG-325 MG TABLET
ORAL_TABLET | Freq: Four times a day (QID) | ORAL | 0 refills | 8.00000 days | Status: CP | PRN
Start: 2020-05-07 — End: 2020-05-29

## 2020-05-07 MED ORDER — TESTOSTERONE CYPIONATE 200 MG/ML INTRAMUSCULAR OIL
SUBCUTANEOUS | 0 refills | 84.00000 days | Status: CP
Start: 2020-05-07 — End: 2020-08-05

## 2020-05-07 MED ORDER — DEXTROAMPHETAMINE-AMPHETAMINE 20 MG TABLET
ORAL_TABLET | Freq: Two times a day (BID) | ORAL | 0 refills | 30.00000 days | Status: CP
Start: 2020-05-07 — End: 2020-05-29

## 2020-05-07 MED ORDER — CLONAZEPAM 0.5 MG TABLET
ORAL_TABLET | Freq: Every day | ORAL | 0 refills | 30 days | Status: CP | PRN
Start: 2020-05-07 — End: 2020-05-29

## 2020-05-12 DIAGNOSIS — D751 Secondary polycythemia: Principal | ICD-10-CM

## 2020-05-19 ENCOUNTER — Encounter: Admit: 2020-05-19 | Discharge: 2020-05-19 | Payer: PRIVATE HEALTH INSURANCE

## 2020-05-19 ENCOUNTER — Ambulatory Visit
Admit: 2020-05-19 | Discharge: 2020-05-19 | Payer: PRIVATE HEALTH INSURANCE | Attending: Plastic and Reconstructive Surgery | Primary: Plastic and Reconstructive Surgery

## 2020-05-19 DIAGNOSIS — Z789 Other specified health status: Principal | ICD-10-CM

## 2020-05-19 DIAGNOSIS — Z1509 Genetic susceptibility to other malignant neoplasm: Principal | ICD-10-CM

## 2020-05-19 DIAGNOSIS — Z1501 Genetic susceptibility to malignant neoplasm of breast: Principal | ICD-10-CM

## 2020-05-21 ENCOUNTER — Ambulatory Visit: Admit: 2020-05-21 | Discharge: 2020-05-22 | Payer: PRIVATE HEALTH INSURANCE

## 2020-05-21 DIAGNOSIS — R269 Unspecified abnormalities of gait and mobility: Principal | ICD-10-CM

## 2020-05-21 DIAGNOSIS — R419 Unspecified symptoms and signs involving cognitive functions and awareness: Principal | ICD-10-CM

## 2020-05-21 DIAGNOSIS — G43009 Migraine without aura, not intractable, without status migrainosus: Principal | ICD-10-CM

## 2020-05-21 MED ORDER — DIVALPROEX ER 500 MG TABLET,EXTENDED RELEASE 24 HR
ORAL_TABLET | Freq: Every day | ORAL | 2 refills | 30.00000 days | Status: CP
Start: 2020-05-21 — End: 2020-06-03

## 2020-05-22 ENCOUNTER — Encounter: Admit: 2020-05-22 | Discharge: 2020-05-23 | Payer: PRIVATE HEALTH INSURANCE

## 2020-05-22 DIAGNOSIS — G894 Chronic pain syndrome: Principal | ICD-10-CM

## 2020-05-22 DIAGNOSIS — F411 Generalized anxiety disorder: Principal | ICD-10-CM

## 2020-05-22 DIAGNOSIS — F1320 Sedative, hypnotic or anxiolytic dependence, uncomplicated: Principal | ICD-10-CM

## 2020-05-22 DIAGNOSIS — Z789 Other specified health status: Principal | ICD-10-CM

## 2020-05-22 DIAGNOSIS — H5710 Ocular pain, unspecified eye: Principal | ICD-10-CM

## 2020-05-22 DIAGNOSIS — L739 Follicular disorder, unspecified: Principal | ICD-10-CM

## 2020-05-22 DIAGNOSIS — F902 Attention-deficit hyperactivity disorder, combined type: Principal | ICD-10-CM

## 2020-05-22 DIAGNOSIS — F331 Major depressive disorder, recurrent, moderate: Principal | ICD-10-CM

## 2020-05-22 DIAGNOSIS — J3089 Other allergic rhinitis: Principal | ICD-10-CM

## 2020-05-25 ENCOUNTER — Ambulatory Visit: Admit: 2020-05-25 | Discharge: 2020-05-26 | Payer: PRIVATE HEALTH INSURANCE

## 2020-05-25 DIAGNOSIS — R419 Unspecified symptoms and signs involving cognitive functions and awareness: Principal | ICD-10-CM

## 2020-05-25 DIAGNOSIS — R269 Unspecified abnormalities of gait and mobility: Principal | ICD-10-CM

## 2020-05-25 DIAGNOSIS — Z789 Other specified health status: Principal | ICD-10-CM

## 2020-05-26 ENCOUNTER — Encounter: Payer: Self-pay | Admitting: *Deleted

## 2020-05-27 ENCOUNTER — Ambulatory Visit: Payer: BLUE CROSS/BLUE SHIELD | Admitting: Diagnostic Neuroimaging

## 2020-05-27 ENCOUNTER — Encounter: Payer: Self-pay | Admitting: Diagnostic Neuroimaging

## 2020-05-27 ENCOUNTER — Telehealth: Payer: Self-pay | Admitting: *Deleted

## 2020-05-27 ENCOUNTER — Encounter: Admit: 2020-05-27 | Discharge: 2020-05-28 | Payer: PRIVATE HEALTH INSURANCE

## 2020-05-27 DIAGNOSIS — M25552 Pain in left hip: Principal | ICD-10-CM

## 2020-05-27 DIAGNOSIS — M25551 Pain in right hip: Principal | ICD-10-CM

## 2020-05-27 DIAGNOSIS — Z7409 Other reduced mobility: Principal | ICD-10-CM

## 2020-05-27 DIAGNOSIS — M25572 Pain in left ankle and joints of left foot: Principal | ICD-10-CM

## 2020-05-27 DIAGNOSIS — M25561 Pain in right knee: Principal | ICD-10-CM

## 2020-05-27 DIAGNOSIS — M25571 Pain in right ankle and joints of right foot: Principal | ICD-10-CM

## 2020-05-27 DIAGNOSIS — M25562 Pain in left knee: Principal | ICD-10-CM

## 2020-05-27 DIAGNOSIS — G8929 Other chronic pain: Principal | ICD-10-CM

## 2020-05-27 NOTE — Telephone Encounter (Signed)
Patient was no show for new patient appointment today. 

## 2020-05-29 ENCOUNTER — Encounter: Admit: 2020-05-29 | Discharge: 2020-05-30 | Payer: PRIVATE HEALTH INSURANCE

## 2020-05-29 DIAGNOSIS — J309 Allergic rhinitis, unspecified: Principal | ICD-10-CM

## 2020-05-29 DIAGNOSIS — F902 Attention-deficit hyperactivity disorder, combined type: Principal | ICD-10-CM

## 2020-05-29 DIAGNOSIS — F411 Generalized anxiety disorder: Principal | ICD-10-CM

## 2020-05-29 DIAGNOSIS — G894 Chronic pain syndrome: Principal | ICD-10-CM

## 2020-05-29 MED ORDER — CLONAZEPAM 0.5 MG TABLET
ORAL_TABLET | Freq: Every day | ORAL | 0 refills | 30.00000 days | Status: CP | PRN
Start: 2020-05-29 — End: 2020-06-24
  Filled 2020-06-01: qty 30, 30d supply, fill #0

## 2020-05-29 MED ORDER — DEXTROAMPHETAMINE-AMPHETAMINE 20 MG TABLET
ORAL_TABLET | Freq: Two times a day (BID) | ORAL | 0 refills | 30.00000 days | Status: CP
Start: 2020-05-29 — End: 2020-06-24
  Filled 2020-06-01: qty 60, 30d supply, fill #0

## 2020-05-29 MED ORDER — OXYCODONE-ACETAMINOPHEN 5 MG-325 MG TABLET
ORAL_TABLET | Freq: Four times a day (QID) | ORAL | 0 refills | 8.00000 days | Status: CP | PRN
Start: 2020-05-29 — End: 2020-06-24
  Filled 2020-05-29: qty 30, 14d supply, fill #0

## 2020-05-29 MED FILL — OXYCODONE-ACETAMINOPHEN 5 MG-325 MG TABLET: 14 days supply | Qty: 30 | Fill #0 | Status: AC

## 2020-06-01 ENCOUNTER — Encounter: Admit: 2020-06-01 | Discharge: 2020-06-01 | Payer: PRIVATE HEALTH INSURANCE

## 2020-06-01 DIAGNOSIS — J4530 Mild persistent asthma, uncomplicated: Principal | ICD-10-CM

## 2020-06-01 DIAGNOSIS — J3089 Other allergic rhinitis: Principal | ICD-10-CM

## 2020-06-01 DIAGNOSIS — H5710 Ocular pain, unspecified eye: Principal | ICD-10-CM

## 2020-06-01 DIAGNOSIS — L508 Other urticaria: Principal | ICD-10-CM

## 2020-06-01 MED ORDER — BUDESONIDE-FORMOTEROL HFA 80 MCG-4.5 MCG/ACTUATION AEROSOL INHALER
Freq: Two times a day (BID) | RESPIRATORY_TRACT | 11 refills | 14.00000 days | Status: CP
Start: 2020-06-01 — End: 2020-06-01

## 2020-06-01 MED ORDER — INHALATIONAL SPACING DEVICE
1 refills | 0 days | Status: CP
Start: 2020-06-01 — End: ?

## 2020-06-01 MED ORDER — BUDESONIDE-FORMOTEROL HFA 160 MCG-4.5 MCG/ACTUATION AEROSOL INHALER
Freq: Two times a day (BID) | RESPIRATORY_TRACT | 11 refills | 31 days | Status: CP
Start: 2020-06-01 — End: 2021-06-01

## 2020-06-01 MED ORDER — AZELASTINE 137 MCG (0.1 %) NASAL SPRAY AEROSOL
Freq: Two times a day (BID) | NASAL | 11 refills | 0 days | Status: CP | PRN
Start: 2020-06-01 — End: 2021-06-01
  Filled 2020-06-01: qty 30, 25d supply, fill #0

## 2020-06-01 MED FILL — SYMBICORT 160 MCG-4.5 MCG/ACTUATION HFA AEROSOL INHALER: RESPIRATORY_TRACT | 30 days supply | Qty: 10.2 | Fill #0

## 2020-06-01 MED FILL — AEROCHAMBER PLUS FLOW-VU: 30 days supply | Qty: 1 | Fill #0 | Status: AC

## 2020-06-01 MED FILL — AEROCHAMBER PLUS FLOW-VU: 30 days supply | Qty: 1 | Fill #0

## 2020-06-01 MED FILL — AZELASTINE 137 MCG (0.1 %) NASAL SPRAY AEROSOL: 25 days supply | Qty: 30 | Fill #0 | Status: AC

## 2020-06-01 MED FILL — CLONAZEPAM 0.5 MG TABLET: 30 days supply | Qty: 30 | Fill #0 | Status: AC

## 2020-06-01 MED FILL — DEXTROAMPHETAMINE-AMPHETAMINE 20 MG TABLET: 30 days supply | Qty: 60 | Fill #0 | Status: AC

## 2020-06-01 MED FILL — SYMBICORT 160 MCG-4.5 MCG/ACTUATION HFA AEROSOL INHALER: 30 days supply | Qty: 10 | Fill #0 | Status: AC

## 2020-06-03 DIAGNOSIS — K591 Functional diarrhea: Principal | ICD-10-CM

## 2020-06-03 DIAGNOSIS — K929 Disease of digestive system, unspecified: Principal | ICD-10-CM

## 2020-06-03 MED ORDER — DICYCLOMINE 20 MG TABLET
ORAL_TABLET | Freq: Four times a day (QID) | ORAL | 2 refills | 30 days | Status: CP
Start: 2020-06-03 — End: 2020-09-01
  Filled 2020-06-05: qty 120, 30d supply, fill #0

## 2020-06-05 ENCOUNTER — Encounter: Admit: 2020-06-05 | Discharge: 2020-06-06 | Payer: PRIVATE HEALTH INSURANCE

## 2020-06-05 DIAGNOSIS — K591 Functional diarrhea: Principal | ICD-10-CM

## 2020-06-05 DIAGNOSIS — K929 Disease of digestive system, unspecified: Principal | ICD-10-CM

## 2020-06-05 DIAGNOSIS — L508 Other urticaria: Principal | ICD-10-CM

## 2020-06-05 MED FILL — DICYCLOMINE 20 MG TABLET: 30 days supply | Qty: 120 | Fill #0 | Status: AC

## 2020-06-09 ENCOUNTER — Encounter: Admit: 2020-06-09 | Discharge: 2020-06-10 | Payer: PRIVATE HEALTH INSURANCE

## 2020-06-09 DIAGNOSIS — G894 Chronic pain syndrome: Principal | ICD-10-CM

## 2020-06-10 DIAGNOSIS — E559 Vitamin D deficiency, unspecified: Principal | ICD-10-CM

## 2020-06-11 ENCOUNTER — Encounter: Admit: 2020-06-11 | Discharge: 2020-06-12 | Payer: PRIVATE HEALTH INSURANCE

## 2020-06-11 DIAGNOSIS — E559 Vitamin D deficiency, unspecified: Principal | ICD-10-CM

## 2020-06-11 MED ORDER — OXYCODONE-ACETAMINOPHEN 5 MG-325 MG TABLET
ORAL_TABLET | Freq: Four times a day (QID) | ORAL | 0 refills | 5.00000 days | Status: CP | PRN
Start: 2020-06-11 — End: 2020-06-22
  Filled 2020-06-11: qty 20, 5d supply, fill #0

## 2020-06-11 MED ORDER — OXYCODONE-ACETAMINOPHEN 5 MG-325 MG TABLET: 1 | tablet | Freq: Four times a day (QID) | 0 refills | 5 days | Status: AC

## 2020-06-11 MED FILL — OXYCODONE-ACETAMINOPHEN 5 MG-325 MG TABLET: 5 days supply | Qty: 20 | Fill #0 | Status: AC

## 2020-06-13 MED ORDER — OXYCODONE-ACETAMINOPHEN 5 MG-325 MG TABLET
ORAL_TABLET | Freq: Four times a day (QID) | ORAL | 0 refills | 5 days | Status: CP | PRN
Start: 2020-06-13 — End: 2020-06-11

## 2020-06-23 ENCOUNTER — Encounter
Admit: 2020-06-23 | Discharge: 2020-07-22 | Payer: PRIVATE HEALTH INSURANCE | Attending: Rehabilitative and Restorative Service Providers" | Primary: Rehabilitative and Restorative Service Providers"

## 2020-06-23 ENCOUNTER — Ambulatory Visit
Admit: 2020-06-23 | Discharge: 2020-07-22 | Payer: PRIVATE HEALTH INSURANCE | Attending: Rehabilitative and Restorative Service Providers" | Primary: Rehabilitative and Restorative Service Providers"

## 2020-06-23 DIAGNOSIS — G894 Chronic pain syndrome: Principal | ICD-10-CM

## 2020-06-23 DIAGNOSIS — R29898 Other symptoms and signs involving the musculoskeletal system: Principal | ICD-10-CM

## 2020-06-23 DIAGNOSIS — R2689 Other abnormalities of gait and mobility: Principal | ICD-10-CM

## 2020-06-24 ENCOUNTER — Encounter: Admit: 2020-06-24 | Discharge: 2020-06-25 | Payer: PRIVATE HEALTH INSURANCE

## 2020-06-24 DIAGNOSIS — Z23 Encounter for immunization: Principal | ICD-10-CM

## 2020-06-24 DIAGNOSIS — Z789 Other specified health status: Principal | ICD-10-CM

## 2020-06-24 DIAGNOSIS — G894 Chronic pain syndrome: Principal | ICD-10-CM

## 2020-06-24 DIAGNOSIS — J309 Allergic rhinitis, unspecified: Principal | ICD-10-CM

## 2020-06-24 DIAGNOSIS — F902 Attention-deficit hyperactivity disorder, combined type: Principal | ICD-10-CM

## 2020-06-24 DIAGNOSIS — R0683 Snoring: Principal | ICD-10-CM

## 2020-06-24 DIAGNOSIS — E538 Deficiency of other specified B group vitamins: Principal | ICD-10-CM

## 2020-06-24 DIAGNOSIS — F411 Generalized anxiety disorder: Principal | ICD-10-CM

## 2020-06-24 DIAGNOSIS — Z0000 Encounter for general adult medical examination without abnormal findings: Principal | ICD-10-CM

## 2020-06-24 DIAGNOSIS — E559 Vitamin D deficiency, unspecified: Principal | ICD-10-CM

## 2020-06-24 MED ORDER — OXYCODONE-ACETAMINOPHEN 5 MG-325 MG TABLET
ORAL_TABLET | Freq: Four times a day (QID) | ORAL | 0 refills | 15.00000 days | Status: CP | PRN
Start: 2020-06-24 — End: 2020-07-24
  Filled 2020-06-24: qty 60, 15d supply, fill #0

## 2020-06-24 MED ORDER — LEVALBUTEROL HFA 45 MCG/ACTUATION AEROSOL INHALER
RESPIRATORY_TRACT | 0 refills | 0 days
Start: 2020-06-24 — End: ?

## 2020-06-24 MED ORDER — GABAPENTIN 250 MG/5 ML ORAL SOLUTION
0 refills | 0 days
Start: 2020-06-24 — End: ?

## 2020-06-24 MED ORDER — CLONAZEPAM 0.5 MG TABLET
ORAL_TABLET | Freq: Every day | ORAL | 0 refills | 30.00000 days | Status: CP | PRN
Start: 2020-06-24 — End: 2020-07-23

## 2020-06-24 MED FILL — OXYCODONE-ACETAMINOPHEN 5 MG-325 MG TABLET: 15 days supply | Qty: 60 | Fill #0 | Status: AC

## 2020-06-26 DIAGNOSIS — E559 Vitamin D deficiency, unspecified: Principal | ICD-10-CM

## 2020-07-01 DIAGNOSIS — R2689 Other abnormalities of gait and mobility: Principal | ICD-10-CM

## 2020-07-01 DIAGNOSIS — G894 Chronic pain syndrome: Principal | ICD-10-CM

## 2020-07-01 DIAGNOSIS — R29898 Other symptoms and signs involving the musculoskeletal system: Principal | ICD-10-CM

## 2020-07-01 MED ORDER — DEXTROAMPHETAMINE-AMPHETAMINE 20 MG TABLET
ORAL_TABLET | Freq: Two times a day (BID) | ORAL | 0 refills | 30.00000 days | Status: CP
Start: 2020-07-01 — End: 2020-07-23

## 2020-07-03 DIAGNOSIS — G894 Chronic pain syndrome: Principal | ICD-10-CM

## 2020-07-03 DIAGNOSIS — R29898 Other symptoms and signs involving the musculoskeletal system: Principal | ICD-10-CM

## 2020-07-03 DIAGNOSIS — R2689 Other abnormalities of gait and mobility: Principal | ICD-10-CM

## 2020-07-03 MED FILL — GABAPENTIN 250 MG/5 ML ORAL SOLUTION: 30 days supply | Qty: 940 | Fill #0

## 2020-07-09 DIAGNOSIS — R2689 Other abnormalities of gait and mobility: Principal | ICD-10-CM

## 2020-07-09 DIAGNOSIS — G894 Chronic pain syndrome: Principal | ICD-10-CM

## 2020-07-09 DIAGNOSIS — R29898 Other symptoms and signs involving the musculoskeletal system: Principal | ICD-10-CM

## 2020-07-13 DIAGNOSIS — R29898 Other symptoms and signs involving the musculoskeletal system: Principal | ICD-10-CM

## 2020-07-13 DIAGNOSIS — G894 Chronic pain syndrome: Principal | ICD-10-CM

## 2020-07-13 DIAGNOSIS — R2689 Other abnormalities of gait and mobility: Principal | ICD-10-CM

## 2020-07-13 MED ORDER — FLUCONAZOLE 150 MG TABLET
ORAL_TABLET | 0 refills | 0.00000 days
Start: 2020-07-13 — End: ?

## 2020-07-14 ENCOUNTER — Encounter
Admit: 2020-07-14 | Discharge: 2020-07-15 | Payer: PRIVATE HEALTH INSURANCE | Attending: Adolescent Medicine | Primary: Adolescent Medicine

## 2020-07-14 ENCOUNTER — Encounter: Admit: 2020-07-14 | Discharge: 2020-07-15 | Payer: PRIVATE HEALTH INSURANCE

## 2020-07-14 DIAGNOSIS — L739 Follicular disorder, unspecified: Principal | ICD-10-CM

## 2020-07-14 MED ORDER — SULFAMETHOXAZOLE 800 MG-TRIMETHOPRIM 160 MG TABLET
ORAL_TABLET | Freq: Two times a day (BID) | ORAL | 0 refills | 10 days | Status: CP
Start: 2020-07-14 — End: 2020-07-24
  Filled 2020-07-14: qty 20, 10d supply, fill #0

## 2020-07-14 MED FILL — FLUCONAZOLE 150 MG TABLET: 3 days supply | Qty: 2 | Fill #0

## 2020-07-21 ENCOUNTER — Encounter: Admit: 2020-07-21 | Discharge: 2020-07-22 | Payer: PRIVATE HEALTH INSURANCE

## 2020-07-21 DIAGNOSIS — L739 Follicular disorder, unspecified: Principal | ICD-10-CM

## 2020-07-21 DIAGNOSIS — G894 Chronic pain syndrome: Principal | ICD-10-CM

## 2020-07-21 DIAGNOSIS — F902 Attention-deficit hyperactivity disorder, combined type: Principal | ICD-10-CM

## 2020-07-21 DIAGNOSIS — F411 Generalized anxiety disorder: Principal | ICD-10-CM

## 2020-07-21 MED ORDER — CLINDAMYCIN HCL 300 MG CAPSULE
ORAL_CAPSULE | Freq: Three times a day (TID) | ORAL | 0 refills | 5.00000 days | Status: CP
Start: 2020-07-21 — End: 2020-07-28
  Filled 2020-07-23: qty 30, 5d supply, fill #0

## 2020-07-22 DIAGNOSIS — G894 Chronic pain syndrome: Principal | ICD-10-CM

## 2020-07-23 ENCOUNTER — Encounter
Admit: 2020-07-23 | Discharge: 2020-08-21 | Payer: PRIVATE HEALTH INSURANCE | Attending: Rehabilitative and Restorative Service Providers" | Primary: Rehabilitative and Restorative Service Providers"

## 2020-07-23 DIAGNOSIS — G894 Chronic pain syndrome: Principal | ICD-10-CM

## 2020-07-23 DIAGNOSIS — R2689 Other abnormalities of gait and mobility: Principal | ICD-10-CM

## 2020-07-23 DIAGNOSIS — R29898 Other symptoms and signs involving the musculoskeletal system: Principal | ICD-10-CM

## 2020-07-25 MED ORDER — CLONAZEPAM 0.5 MG TABLET
ORAL_TABLET | Freq: Every day | ORAL | 0 refills | 30.00000 days | Status: CP | PRN
Start: 2020-07-25 — End: 2020-08-26
  Filled 2020-07-27: qty 30, 30d supply, fill #0

## 2020-07-25 MED ORDER — OXYCODONE-ACETAMINOPHEN 5 MG-325 MG TABLET
ORAL_TABLET | Freq: Four times a day (QID) | ORAL | 0 refills | 15 days | Status: CP | PRN
Start: 2020-07-25 — End: 2020-08-24
  Filled 2020-07-27: qty 60, 15d supply, fill #0

## 2020-07-25 MED ORDER — DEXTROAMPHETAMINE-AMPHETAMINE 20 MG TABLET
ORAL_TABLET | Freq: Two times a day (BID) | ORAL | 0 refills | 27.00000 days | Status: CP
Start: 2020-07-25 — End: 2020-08-23
  Filled 2020-07-27: qty 54, 27d supply, fill #0

## 2020-07-25 MED ORDER — GABAPENTIN 250 MG/5 ML ORAL SOLUTION
Freq: Three times a day (TID) | ORAL | 0 refills | 16.00000 days | Status: CP
Start: 2020-07-25 — End: 2020-08-16
  Filled 2020-07-31: qty 470, 16d supply, fill #0

## 2020-07-29 ENCOUNTER — Encounter: Admit: 2020-07-29 | Discharge: 2020-07-30 | Payer: PRIVATE HEALTH INSURANCE

## 2020-07-29 DIAGNOSIS — R269 Unspecified abnormalities of gait and mobility: Principal | ICD-10-CM

## 2020-07-30 ENCOUNTER — Ambulatory Visit: Admit: 2020-07-30 | Payer: PRIVATE HEALTH INSURANCE | Attending: Hematology & Oncology | Primary: Hematology & Oncology

## 2020-08-16 DIAGNOSIS — D751 Secondary polycythemia: Principal | ICD-10-CM

## 2020-08-19 DIAGNOSIS — G894 Chronic pain syndrome: Principal | ICD-10-CM

## 2020-08-21 ENCOUNTER — Encounter: Admit: 2020-08-21 | Discharge: 2020-08-22 | Payer: PRIVATE HEALTH INSURANCE

## 2020-08-21 DIAGNOSIS — G894 Chronic pain syndrome: Principal | ICD-10-CM

## 2020-08-21 MED ORDER — CLONAZEPAM 0.5 MG TABLET
ORAL_TABLET | Freq: Every day | ORAL | 1 refills | 15 days | Status: CP | PRN
Start: 2020-08-21 — End: 2020-10-20
  Filled 2020-08-21: qty 15, 15d supply, fill #0

## 2020-08-21 MED ORDER — OXYCODONE-ACETAMINOPHEN 5 MG-325 MG TABLET
ORAL_TABLET | Freq: Two times a day (BID) | ORAL | 0 refills | 30 days | Status: CP | PRN
Start: 2020-08-21 — End: 2020-09-20
  Filled 2020-08-21: qty 60, 30d supply, fill #0

## 2020-08-21 MED ORDER — SYRINGE (DISPOSABLE) 1 ML
0 refills | 0 days | Status: CP
Start: 2020-08-21 — End: ?
  Filled 2020-08-25: qty 12, 84d supply, fill #0

## 2020-08-21 MED ORDER — GABAPENTIN 250 MG/5 ML ORAL SOLUTION
Freq: Three times a day (TID) | ORAL | 1 refills | 30 days | Status: CP
Start: 2020-08-21 — End: 2020-10-20
  Filled 2020-08-20: qty 10, 1d supply, fill #1
  Filled 2020-08-21: qty 900, 30d supply, fill #0

## 2020-08-21 MED ORDER — NEEDLE (DISP) 25 GAUGE X 5/8"
0 refills | 0 days | Status: CP
Start: 2020-08-21 — End: ?

## 2020-08-21 MED ORDER — NEEDLE (DISP) 18 GAUGE X 1 1/2"
0 refills | 0 days | Status: CP
Start: 2020-08-21 — End: ?

## 2020-08-21 MED ORDER — DEXTROAMPHETAMINE-AMPHETAMINE 20 MG TABLET
ORAL_TABLET | Freq: Two times a day (BID) | ORAL | 0 refills | 30 days | Status: CP
Start: 2020-08-21 — End: 2020-09-20
  Filled 2020-08-21: qty 60, 30d supply, fill #0

## 2020-08-21 MED ORDER — TESTOSTERONE CYPIONATE 200 MG/ML INTRAMUSCULAR OIL
SUBCUTANEOUS | 0 refills | 84.00000 days | Status: CP
Start: 2020-08-21 — End: 2020-11-19
  Filled 2020-08-25: qty 3, 21d supply, fill #0

## 2020-08-25 ENCOUNTER — Ambulatory Visit: Admit: 2020-08-25 | Discharge: 2020-08-26 | Payer: PRIVATE HEALTH INSURANCE

## 2020-08-25 ENCOUNTER — Encounter: Admit: 2020-08-25 | Discharge: 2020-08-26 | Payer: PRIVATE HEALTH INSURANCE

## 2020-08-25 DIAGNOSIS — M2550 Pain in unspecified joint: Principal | ICD-10-CM

## 2020-08-25 DIAGNOSIS — G729 Myopathy, unspecified: Principal | ICD-10-CM

## 2020-08-25 DIAGNOSIS — R0602 Shortness of breath: Principal | ICD-10-CM

## 2020-08-25 MED FILL — BD REGULAR BEVEL NEEDLES 18 GAUGE X 1 1/2": 84 days supply | Qty: 12 | Fill #0

## 2020-08-25 MED FILL — BD REGULAR BEVEL NEEDLES 25 GAUGE X 5/8": 84 days supply | Qty: 12 | Fill #0

## 2020-08-27 ENCOUNTER — Encounter
Admit: 2020-08-27 | Discharge: 2020-08-28 | Payer: PRIVATE HEALTH INSURANCE | Attending: Student in an Organized Health Care Education/Training Program | Primary: Student in an Organized Health Care Education/Training Program

## 2020-08-27 DIAGNOSIS — J310 Chronic rhinitis: Principal | ICD-10-CM

## 2020-08-27 DIAGNOSIS — J309 Allergic rhinitis, unspecified: Principal | ICD-10-CM

## 2020-08-27 DIAGNOSIS — R0683 Snoring: Principal | ICD-10-CM

## 2020-08-27 DIAGNOSIS — J329 Chronic sinusitis, unspecified: Principal | ICD-10-CM

## 2020-08-27 MED ORDER — FLUTICASONE PROPIONATE 50 MCG/ACTUATION NASAL SPRAY,SUSPENSION
Freq: Two times a day (BID) | NASAL | 1 refills | 0 days | Status: CP
Start: 2020-08-27 — End: 2021-08-27

## 2020-08-31 MED ORDER — ERGOCALCIFEROL (VITAMIN D2) 1,250 MCG (50,000 UNIT) CAPSULE
ORAL_CAPSULE | 0 refills | 0.00000 days
Start: 2020-08-31 — End: ?

## 2020-09-01 DIAGNOSIS — D751 Secondary polycythemia: Principal | ICD-10-CM

## 2020-09-01 MED FILL — ERGOCALCIFEROL (VITAMIN D2) 1,250 MCG (50,000 UNIT) CAPSULE: 84 days supply | Qty: 12 | Fill #0

## 2020-09-02 ENCOUNTER — Ambulatory Visit: Admit: 2020-09-02 | Discharge: 2020-09-03 | Payer: PRIVATE HEALTH INSURANCE

## 2020-09-04 ENCOUNTER — Encounter: Admit: 2020-09-04 | Discharge: 2020-09-05 | Payer: PRIVATE HEALTH INSURANCE

## 2020-09-08 ENCOUNTER — Encounter: Admit: 2020-09-08 | Discharge: 2020-09-09 | Payer: PRIVATE HEALTH INSURANCE

## 2020-09-14 DIAGNOSIS — R748 Abnormal levels of other serum enzymes: Principal | ICD-10-CM

## 2020-09-20 MED ORDER — OXYCODONE-ACETAMINOPHEN 5 MG-325 MG TABLET
ORAL_TABLET | Freq: Two times a day (BID) | ORAL | 0 refills | 30 days | Status: CP | PRN
Start: 2020-09-20 — End: 2020-10-20
  Filled 2020-09-21: qty 60, 30d supply, fill #0

## 2020-09-20 MED ORDER — DEXTROAMPHETAMINE-AMPHETAMINE 20 MG TABLET
ORAL_TABLET | Freq: Every day | ORAL | 0 refills | 30 days | Status: CP
Start: 2020-09-20 — End: 2020-10-20

## 2020-09-21 ENCOUNTER — Encounter: Admit: 2020-09-21 | Discharge: 2020-09-21 | Payer: PRIVATE HEALTH INSURANCE

## 2020-09-21 ENCOUNTER — Ambulatory Visit: Admit: 2020-09-21 | Discharge: 2020-09-21 | Payer: PRIVATE HEALTH INSURANCE

## 2020-09-21 DIAGNOSIS — R748 Abnormal levels of other serum enzymes: Principal | ICD-10-CM

## 2020-09-21 DIAGNOSIS — F902 Attention-deficit hyperactivity disorder, combined type: Principal | ICD-10-CM

## 2020-09-21 DIAGNOSIS — F411 Generalized anxiety disorder: Principal | ICD-10-CM

## 2020-09-21 MED ORDER — DEXTROAMPHETAMINE-AMPHETAMINE 20 MG TABLET
ORAL_TABLET | Freq: Two times a day (BID) | ORAL | 0 refills | 30 days | Status: CP
Start: 2020-09-21 — End: 2020-10-21
  Filled 2020-09-22: qty 60, 30d supply, fill #0

## 2020-09-21 MED ORDER — CLONAZEPAM 0.5 MG TABLET
ORAL_TABLET | Freq: Every day | ORAL | 0 refills | 15 days | Status: CP | PRN
Start: 2020-09-21 — End: 2020-10-06
  Filled 2020-09-21: qty 15, 15d supply, fill #1
  Filled 2020-10-07: qty 15, 15d supply, fill #0

## 2020-09-21 MED FILL — GABAPENTIN 250 MG/5 ML ORAL SOLUTION: ORAL | 30 days supply | Qty: 900 | Fill #1

## 2020-09-24 ENCOUNTER — Encounter: Admit: 2020-09-24 | Discharge: 2020-09-25 | Payer: PRIVATE HEALTH INSURANCE

## 2020-09-24 DIAGNOSIS — R768 Other specified abnormal immunological findings in serum: Principal | ICD-10-CM

## 2020-09-24 DIAGNOSIS — M791 Myalgia, unspecified site: Principal | ICD-10-CM

## 2020-09-24 DIAGNOSIS — K649 Unspecified hemorrhoids: Principal | ICD-10-CM

## 2020-09-24 DIAGNOSIS — R748 Abnormal levels of other serum enzymes: Principal | ICD-10-CM

## 2020-09-24 MED ORDER — NIFED 0.3%/LIDOCA 1.5% W/PETRO
0 refills | 0 days | Status: CP
Start: 2020-09-24 — End: ?
  Filled 2020-09-24: qty 100, 30d supply, fill #0

## 2020-10-22 ENCOUNTER — Encounter: Admit: 2020-10-22 | Discharge: 2020-10-23 | Payer: PRIVATE HEALTH INSURANCE

## 2020-10-22 MED ORDER — GABAPENTIN 250 MG/5 ML ORAL SOLUTION
Freq: Three times a day (TID) | ORAL | 0 refills | 34 days | Status: CP
Start: 2020-10-22 — End: 2020-11-24
  Filled 2020-10-22: qty 1000, 33d supply, fill #0

## 2020-10-22 MED ORDER — CLONAZEPAM 0.5 MG TABLET
ORAL_TABLET | Freq: Every day | ORAL | 0 refills | 30.00000 days | Status: CP | PRN
Start: 2020-10-22 — End: 2020-11-21
  Filled 2020-10-22: qty 30, 30d supply, fill #0

## 2020-10-22 MED ORDER — DEXTROAMPHETAMINE-AMPHETAMINE 20 MG TABLET
ORAL_TABLET | Freq: Two times a day (BID) | ORAL | 0 refills | 30 days | Status: CP
Start: 2020-10-22 — End: 2020-11-21
  Filled 2020-10-22: qty 60, 30d supply, fill #0

## 2020-10-22 MED ORDER — AMITRIPTYLINE 50 MG TABLET
ORAL_TABLET | Freq: Every evening | ORAL | 2 refills | 30 days | Status: CP
Start: 2020-10-22 — End: 2021-01-20
  Filled 2020-10-22: qty 30, 30d supply, fill #0

## 2020-10-22 MED ORDER — OXYCODONE-ACETAMINOPHEN 5 MG-325 MG TABLET
ORAL_TABLET | Freq: Two times a day (BID) | ORAL | 0 refills | 30.00000 days | Status: CP | PRN
Start: 2020-10-22 — End: 2020-11-21
  Filled 2020-10-22: qty 60, 30d supply, fill #0

## 2020-10-29 DIAGNOSIS — Z789 Other specified health status: Principal | ICD-10-CM

## 2020-10-29 DIAGNOSIS — E349 Endocrine disorder, unspecified: Principal | ICD-10-CM

## 2020-11-05 ENCOUNTER — Ambulatory Visit: Admit: 2020-11-05 | Payer: PRIVATE HEALTH INSURANCE

## 2020-11-09 ENCOUNTER — Ambulatory Visit: Admit: 2020-11-09 | Discharge: 2020-11-10 | Payer: PRIVATE HEALTH INSURANCE

## 2020-11-17 DIAGNOSIS — F902 Attention-deficit hyperactivity disorder, combined type: Principal | ICD-10-CM

## 2020-11-17 DIAGNOSIS — G894 Chronic pain syndrome: Principal | ICD-10-CM

## 2020-11-17 DIAGNOSIS — M2550 Pain in unspecified joint: Principal | ICD-10-CM

## 2020-11-17 DIAGNOSIS — G729 Myopathy, unspecified: Principal | ICD-10-CM

## 2020-11-17 DIAGNOSIS — F411 Generalized anxiety disorder: Principal | ICD-10-CM

## 2020-11-18 MED ORDER — GABAPENTIN 250 MG/5 ML ORAL SOLUTION
Freq: Three times a day (TID) | ORAL | 0 refills | 34 days | Status: CP
Start: 2020-11-18 — End: 2020-12-21

## 2020-11-18 MED ORDER — OXYCODONE-ACETAMINOPHEN 5 MG-325 MG TABLET
ORAL_TABLET | Freq: Two times a day (BID) | ORAL | 0 refills | 30 days | Status: CP | PRN
Start: 2020-11-18 — End: 2020-11-18

## 2020-11-18 MED ORDER — AMITRIPTYLINE 50 MG TABLET
ORAL_TABLET | Freq: Every evening | ORAL | 2 refills | 30 days | Status: CP
Start: 2020-11-18 — End: 2020-11-18

## 2020-11-18 MED ORDER — CLONAZEPAM 0.5 MG TABLET
ORAL_TABLET | Freq: Every day | ORAL | 0 refills | 30 days | Status: CP | PRN
Start: 2020-11-18 — End: 2020-11-18

## 2020-11-18 MED ORDER — TRULANCE 3 MG TABLET
ORAL_TABLET | Freq: Every day | ORAL | 2 refills | 0 days
Start: 2020-11-18 — End: ?

## 2020-11-18 MED ORDER — DEXTROAMPHETAMINE-AMPHETAMINE 20 MG TABLET
ORAL_TABLET | Freq: Two times a day (BID) | ORAL | 0 refills | 30 days | Status: CP
Start: 2020-11-18 — End: 2020-11-18

## 2020-11-19 MED ORDER — OMEPRAZOLE 40 MG CAPSULE,DELAYED RELEASE
ORAL_CAPSULE | 5 refills | 0 days | Status: CP
Start: 2020-11-19 — End: ?

## 2020-11-20 MED ORDER — CLONAZEPAM 0.5 MG TABLET
ORAL_TABLET | Freq: Every day | ORAL | 0 refills | 30 days | Status: CP | PRN
Start: 2020-11-20 — End: 2020-12-20

## 2020-11-20 MED ORDER — AMITRIPTYLINE 50 MG TABLET
ORAL_TABLET | Freq: Every evening | ORAL | 2 refills | 30.00000 days | Status: CP
Start: 2020-11-20 — End: 2021-02-20
  Filled 2021-01-18: qty 30, 30d supply, fill #1

## 2020-11-20 MED ORDER — DEXTROAMPHETAMINE-AMPHETAMINE 20 MG TABLET
ORAL_TABLET | Freq: Two times a day (BID) | ORAL | 0 refills | 30 days | Status: CP
Start: 2020-11-20 — End: 2020-12-20

## 2020-11-20 MED ORDER — OXYCODONE-ACETAMINOPHEN 5 MG-325 MG TABLET
ORAL_TABLET | Freq: Two times a day (BID) | ORAL | 0 refills | 30 days | Status: CP | PRN
Start: 2020-11-20 — End: 2020-12-20

## 2020-11-27 ENCOUNTER — Ambulatory Visit: Admit: 2020-11-27 | Discharge: 2020-11-28 | Payer: PRIVATE HEALTH INSURANCE

## 2020-11-27 ENCOUNTER — Encounter: Admit: 2020-11-27 | Discharge: 2020-11-28 | Payer: PRIVATE HEALTH INSURANCE

## 2020-11-27 DIAGNOSIS — Z789 Other specified health status: Principal | ICD-10-CM

## 2020-11-27 DIAGNOSIS — E349 Endocrine disorder, unspecified: Principal | ICD-10-CM

## 2020-11-27 MED ORDER — TESTOSTERONE CYPIONATE 200 MG/ML INTRAMUSCULAR OIL
SUBCUTANEOUS | 0 refills | 70 days | Status: CP
Start: 2020-11-27 — End: 2021-02-26
  Filled 2020-12-01: qty 2, 74d supply, fill #0

## 2020-11-30 ENCOUNTER — Ambulatory Visit: Admit: 2020-11-30 | Payer: PRIVATE HEALTH INSURANCE

## 2020-12-01 ENCOUNTER — Encounter: Admit: 2020-12-01 | Discharge: 2020-12-02 | Payer: PRIVATE HEALTH INSURANCE

## 2020-12-01 DIAGNOSIS — G729 Myopathy, unspecified: Principal | ICD-10-CM

## 2020-12-03 ENCOUNTER — Encounter: Admit: 2020-12-03 | Discharge: 2020-12-03 | Payer: PRIVATE HEALTH INSURANCE

## 2020-12-03 ENCOUNTER — Ambulatory Visit
Admit: 2020-12-03 | Discharge: 2020-12-03 | Payer: PRIVATE HEALTH INSURANCE | Attending: Student in an Organized Health Care Education/Training Program | Primary: Student in an Organized Health Care Education/Training Program

## 2020-12-03 DIAGNOSIS — J329 Chronic sinusitis, unspecified: Principal | ICD-10-CM

## 2020-12-03 DIAGNOSIS — J310 Chronic rhinitis: Principal | ICD-10-CM

## 2020-12-08 DIAGNOSIS — Z01818 Encounter for other preprocedural examination: Principal | ICD-10-CM

## 2020-12-09 ENCOUNTER — Ambulatory Visit: Admit: 2020-12-09 | Payer: PRIVATE HEALTH INSURANCE | Attending: Gastroenterology | Primary: Gastroenterology

## 2020-12-16 ENCOUNTER — Encounter
Admit: 2020-12-16 | Discharge: 2020-12-17 | Payer: PRIVATE HEALTH INSURANCE | Attending: Gastroenterology | Primary: Gastroenterology

## 2020-12-17 DIAGNOSIS — G894 Chronic pain syndrome: Principal | ICD-10-CM

## 2020-12-17 DIAGNOSIS — F902 Attention-deficit hyperactivity disorder, combined type: Principal | ICD-10-CM

## 2020-12-17 DIAGNOSIS — F411 Generalized anxiety disorder: Principal | ICD-10-CM

## 2020-12-18 ENCOUNTER — Encounter: Admit: 2020-12-18 | Discharge: 2020-12-19 | Payer: PRIVATE HEALTH INSURANCE

## 2020-12-18 MED ORDER — GABAPENTIN 250 MG/5 ML ORAL SOLUTION
Freq: Three times a day (TID) | ORAL | 0 refills | 34 days | Status: CP
Start: 2020-12-18 — End: 2021-01-20
  Filled 2021-01-01: qty 1000, 33d supply, fill #0

## 2020-12-18 MED ORDER — CLONAZEPAM 0.5 MG TABLET
ORAL_TABLET | Freq: Every day | ORAL | 0 refills | 30 days | Status: CP | PRN
Start: 2020-12-18 — End: 2021-01-17
  Filled 2020-12-18: qty 30, 30d supply, fill #0

## 2020-12-18 MED ORDER — DEXTROAMPHETAMINE-AMPHETAMINE 20 MG TABLET
ORAL_TABLET | Freq: Two times a day (BID) | ORAL | 0 refills | 30.00000 days | Status: CP
Start: 2020-12-18 — End: 2021-01-17
  Filled 2020-12-18: qty 60, 30d supply, fill #0

## 2020-12-18 MED ORDER — OXYCODONE-ACETAMINOPHEN 5 MG-325 MG TABLET
ORAL_TABLET | Freq: Two times a day (BID) | ORAL | 0 refills | 30 days | Status: CP | PRN
Start: 2020-12-18 — End: 2021-01-17
  Filled 2020-12-18: qty 60, 30d supply, fill #0

## 2020-12-22 ENCOUNTER — Encounter: Admit: 2020-12-22 | Discharge: 2020-12-24 | Payer: PRIVATE HEALTH INSURANCE

## 2020-12-25 DIAGNOSIS — G4733 Obstructive sleep apnea (adult) (pediatric): Principal | ICD-10-CM

## 2020-12-31 ENCOUNTER — Ambulatory Visit: Admit: 2020-12-31 | Payer: PRIVATE HEALTH INSURANCE | Attending: Hematology & Oncology | Primary: Hematology & Oncology

## 2021-01-04 ENCOUNTER — Encounter: Admit: 2021-01-04 | Discharge: 2021-01-04 | Payer: PRIVATE HEALTH INSURANCE

## 2021-01-04 ENCOUNTER — Encounter
Admit: 2021-01-04 | Discharge: 2021-01-04 | Payer: PRIVATE HEALTH INSURANCE | Attending: Certified Registered" | Primary: Certified Registered"

## 2021-01-04 MED ORDER — DOXYCYCLINE HYCLATE 100 MG TABLET
ORAL_TABLET | Freq: Two times a day (BID) | ORAL | 0 refills | 7.00000 days | Status: CP
Start: 2021-01-04 — End: 2021-01-11
  Filled 2021-01-04: qty 14, 7d supply, fill #0

## 2021-01-04 MED ORDER — OXYCODONE 5 MG TABLET
ORAL_TABLET | ORAL | 0 refills | 2.00000 days | Status: CP | PRN
Start: 2021-01-04 — End: 2021-01-09
  Filled 2021-01-04: qty 10, 2d supply, fill #0

## 2021-01-07 ENCOUNTER — Ambulatory Visit
Admit: 2021-01-07 | Discharge: 2021-01-08 | Payer: PRIVATE HEALTH INSURANCE | Attending: Student in an Organized Health Care Education/Training Program | Primary: Student in an Organized Health Care Education/Training Program

## 2021-01-11 ENCOUNTER — Encounter: Admit: 2021-01-11 | Discharge: 2021-01-12 | Payer: PRIVATE HEALTH INSURANCE

## 2021-01-12 MED FILL — SYMBICORT 160 MCG-4.5 MCG/ACTUATION HFA AEROSOL INHALER: RESPIRATORY_TRACT | 30 days supply | Qty: 10.2 | Fill #1

## 2021-01-12 MED FILL — AZELASTINE 137 MCG (0.1 %) NASAL SPRAY AEROSOL: NASAL | 25 days supply | Qty: 30 | Fill #1

## 2021-01-12 MED FILL — TRULANCE 3 MG TABLET: ORAL | 30 days supply | Qty: 30 | Fill #1

## 2021-01-13 DIAGNOSIS — F902 Attention-deficit hyperactivity disorder, combined type: Principal | ICD-10-CM

## 2021-01-13 DIAGNOSIS — Z789 Other specified health status: Principal | ICD-10-CM

## 2021-01-13 DIAGNOSIS — F411 Generalized anxiety disorder: Principal | ICD-10-CM

## 2021-01-13 DIAGNOSIS — E349 Endocrine disorder, unspecified: Principal | ICD-10-CM

## 2021-01-13 DIAGNOSIS — G894 Chronic pain syndrome: Principal | ICD-10-CM

## 2021-01-18 MED ORDER — DEXTROAMPHETAMINE-AMPHETAMINE 20 MG TABLET
ORAL_TABLET | Freq: Two times a day (BID) | ORAL | 0 refills | 30 days | Status: CP
Start: 2021-01-18 — End: 2021-02-17
  Filled 2021-01-19: qty 60, 30d supply, fill #0

## 2021-01-18 MED ORDER — CLONAZEPAM 0.5 MG TABLET
ORAL_TABLET | Freq: Every day | ORAL | 0 refills | 30.00000 days | Status: CP | PRN
Start: 2021-01-18 — End: 2021-02-17
  Filled 2021-01-18: qty 30, 30d supply, fill #0

## 2021-01-18 MED ORDER — OXYCODONE-ACETAMINOPHEN 5 MG-325 MG TABLET
ORAL_TABLET | Freq: Two times a day (BID) | ORAL | 0 refills | 30 days | Status: CP | PRN
Start: 2021-01-18 — End: 2021-02-17
  Filled 2021-01-18: qty 60, 30d supply, fill #0

## 2021-01-18 MED FILL — BD LUER-LOK SYRINGE 1 ML: 84 days supply | Qty: 12 | Fill #1

## 2021-01-18 MED FILL — BD REGULAR BEVEL NEEDLES 18 GAUGE X 1 1/2": 84 days supply | Qty: 12 | Fill #1

## 2021-01-18 MED FILL — BD REGULAR BEVEL NEEDLES 25 GAUGE X 5/8": 84 days supply | Qty: 12 | Fill #1

## 2021-01-26 ENCOUNTER — Encounter
Admit: 2021-01-26 | Discharge: 2021-01-27 | Payer: PRIVATE HEALTH INSURANCE | Attending: Family Medicine | Primary: Family Medicine

## 2021-01-28 MED ORDER — TESTOSTERONE CYPIONATE 200 MG/ML INTRAMUSCULAR OIL
SUBCUTANEOUS | 0 refills | 70.00000 days | Status: CP
Start: 2021-01-28 — End: 2021-04-26

## 2021-02-01 MED ORDER — TESTOSTERONE CYPIONATE 200 MG/ML INTRAMUSCULAR OIL
SUBCUTANEOUS | 0 refills | 70 days | Status: CP
Start: 2021-02-01 — End: 2021-04-12
  Filled 2021-02-03: qty 2, 70d supply, fill #0

## 2021-02-08 ENCOUNTER — Ambulatory Visit
Admit: 2021-02-08 | Discharge: 2021-02-09 | Payer: PRIVATE HEALTH INSURANCE | Attending: Family Medicine | Primary: Family Medicine

## 2021-02-12 DIAGNOSIS — G894 Chronic pain syndrome: Principal | ICD-10-CM

## 2021-02-12 DIAGNOSIS — F902 Attention-deficit hyperactivity disorder, combined type: Principal | ICD-10-CM

## 2021-02-12 DIAGNOSIS — F411 Generalized anxiety disorder: Principal | ICD-10-CM

## 2021-02-18 ENCOUNTER — Ambulatory Visit: Admit: 2021-02-18 | Discharge: 2021-02-19 | Payer: PRIVATE HEALTH INSURANCE

## 2021-02-18 MED ORDER — DEXTROAMPHETAMINE-AMPHETAMINE 20 MG TABLET
ORAL_TABLET | Freq: Two times a day (BID) | ORAL | 0 refills | 30 days | Status: CP
Start: 2021-02-18 — End: 2021-03-20
  Filled 2021-02-18: qty 60, 30d supply, fill #0

## 2021-02-18 MED ORDER — GABAPENTIN 250 MG/5 ML ORAL SOLUTION
Freq: Three times a day (TID) | ORAL | 0 refills | 34 days | Status: CP
Start: 2021-02-18 — End: 2021-03-23
  Filled 2021-02-18: qty 1000, 33d supply, fill #0

## 2021-02-18 MED ORDER — CLONAZEPAM 0.5 MG TABLET
ORAL_TABLET | Freq: Every day | ORAL | 0 refills | 30.00000 days | Status: CP | PRN
Start: 2021-02-18 — End: 2021-03-20
  Filled 2021-02-18: qty 30, 30d supply, fill #0

## 2021-02-18 MED ORDER — OXYCODONE-ACETAMINOPHEN 5 MG-325 MG TABLET
ORAL_TABLET | Freq: Two times a day (BID) | ORAL | 0 refills | 30 days | Status: CP | PRN
Start: 2021-02-18 — End: 2021-03-20
  Filled 2021-02-18: qty 60, 30d supply, fill #0

## 2021-02-18 MED FILL — TRULANCE 3 MG TABLET: ORAL | 30 days supply | Qty: 30 | Fill #2

## 2021-02-18 MED FILL — AMITRIPTYLINE 50 MG TABLET: ORAL | 30 days supply | Qty: 30 | Fill #2

## 2021-02-23 ENCOUNTER — Encounter: Admit: 2021-02-23 | Discharge: 2021-02-23 | Payer: PRIVATE HEALTH INSURANCE

## 2021-02-23 ENCOUNTER — Encounter
Admit: 2021-02-23 | Discharge: 2021-02-23 | Payer: PRIVATE HEALTH INSURANCE | Attending: Family Medicine | Primary: Family Medicine

## 2021-02-23 DIAGNOSIS — M7062 Trochanteric bursitis, left hip: Principal | ICD-10-CM

## 2021-02-23 DIAGNOSIS — R768 Other specified abnormal immunological findings in serum: Principal | ICD-10-CM

## 2021-02-23 DIAGNOSIS — M7661 Achilles tendinitis, right leg: Principal | ICD-10-CM

## 2021-02-23 DIAGNOSIS — M7662 Achilles tendinitis, left leg: Principal | ICD-10-CM

## 2021-03-08 ENCOUNTER — Ambulatory Visit: Admit: 2021-03-08 | Payer: PRIVATE HEALTH INSURANCE

## 2021-03-09 ENCOUNTER — Ambulatory Visit: Admit: 2021-03-09 | Discharge: 2021-03-10 | Payer: PRIVATE HEALTH INSURANCE

## 2021-03-09 MED ORDER — DICLOFENAC 1 % TOPICAL GEL
Freq: Four times a day (QID) | TOPICAL | 0 refills | 13 days | Status: CP
Start: 2021-03-09 — End: 2022-03-09

## 2021-03-12 ENCOUNTER — Ambulatory Visit
Admit: 2021-03-12 | Discharge: 2021-03-13 | Payer: PRIVATE HEALTH INSURANCE | Attending: Family Medicine | Primary: Family Medicine

## 2021-03-15 DIAGNOSIS — G894 Chronic pain syndrome: Principal | ICD-10-CM

## 2021-03-15 DIAGNOSIS — F411 Generalized anxiety disorder: Principal | ICD-10-CM

## 2021-03-15 DIAGNOSIS — F902 Attention-deficit hyperactivity disorder, combined type: Principal | ICD-10-CM

## 2021-03-15 MED ORDER — AMITRIPTYLINE 50 MG TABLET
ORAL_TABLET | Freq: Every evening | ORAL | 2 refills | 30 days | Status: CP
Start: 2021-03-15 — End: 2021-06-15
  Filled 2021-03-19: qty 30, 30d supply, fill #0

## 2021-03-16 DIAGNOSIS — G894 Chronic pain syndrome: Principal | ICD-10-CM

## 2021-03-18 DIAGNOSIS — F902 Attention-deficit hyperactivity disorder, combined type: Principal | ICD-10-CM

## 2021-03-18 DIAGNOSIS — J4530 Mild persistent asthma, uncomplicated: Principal | ICD-10-CM

## 2021-03-18 DIAGNOSIS — G894 Chronic pain syndrome: Principal | ICD-10-CM

## 2021-03-18 DIAGNOSIS — F411 Generalized anxiety disorder: Principal | ICD-10-CM

## 2021-03-18 MED ORDER — GABAPENTIN 250 MG/5 ML ORAL SOLUTION
Freq: Three times a day (TID) | ORAL | 1 refills | 34 days | Status: CP
Start: 2021-03-18 — End: 2021-04-21
  Filled 2021-03-19: qty 1000, 34d supply, fill #0

## 2021-03-18 MED ORDER — BUDESONIDE-FORMOTEROL HFA 160 MCG-4.5 MCG/ACTUATION AEROSOL INHALER
Freq: Two times a day (BID) | RESPIRATORY_TRACT | 5 refills | 31 days | Status: CP
Start: 2021-03-18 — End: 2022-03-18

## 2021-03-19 MED ORDER — CLONAZEPAM 0.5 MG TABLET
ORAL_TABLET | Freq: Every day | ORAL | 0 refills | 30.00000 days | Status: CP | PRN
Start: 2021-03-19 — End: 2021-04-18
  Filled 2021-03-19: qty 30, 30d supply, fill #0

## 2021-03-19 MED ORDER — DEXTROAMPHETAMINE-AMPHETAMINE 20 MG TABLET
ORAL_TABLET | Freq: Two times a day (BID) | ORAL | 0 refills | 30 days | Status: CP
Start: 2021-03-19 — End: 2021-04-18
  Filled 2021-03-19: qty 20, 10d supply, fill #0

## 2021-03-19 MED ORDER — OXYCODONE-ACETAMINOPHEN 5 MG-325 MG TABLET
ORAL_TABLET | Freq: Two times a day (BID) | ORAL | 0 refills | 30 days | Status: CP | PRN
Start: 2021-03-19 — End: 2021-04-18
  Filled 2021-03-19: qty 60, 30d supply, fill #0

## 2021-03-19 MED FILL — SYMBICORT 160 MCG-4.5 MCG/ACTUATION HFA AEROSOL INHALER: RESPIRATORY_TRACT | 31 days supply | Qty: 10.2 | Fill #0

## 2021-03-19 MED FILL — AEROCHAMBER PLUS FLOW-VU: 30 days supply | Qty: 1 | Fill #1

## 2021-04-02 DIAGNOSIS — F902 Attention-deficit hyperactivity disorder, combined type: Principal | ICD-10-CM

## 2021-04-02 MED ORDER — DEXTROAMPHETAMINE-AMPHETAMINE 20 MG TABLET
ORAL_TABLET | Freq: Two times a day (BID) | ORAL | 0 refills | 20 days | Status: CP
Start: 2021-04-02 — End: 2021-04-22
  Filled 2021-04-07: qty 40, 20d supply, fill #0

## 2021-04-06 MED ORDER — FLULAVAL QUAD 2022-2023 (PF) 60 MCG (15 MCG X 4)/0.5 ML IM SYRINGE
INTRAMUSCULAR | 0 refills | 0.00000 days
Start: 2021-04-06 — End: 2021-04-06

## 2021-04-07 MED ORDER — FLULAVAL QUAD 2022-2023 (PF) 60 MCG (15 MCG X 4)/0.5 ML IM SYRINGE
Freq: Once | INTRAMUSCULAR | 0 refills | 1.00000 days
Start: 2021-04-07 — End: 2021-04-08

## 2021-04-07 MED FILL — FLULAVAL QUAD 2022-2023 (PF) 60 MCG (15 MCG X 4)/0.5 ML IM SYRINGE: INTRAMUSCULAR | 1 days supply | Qty: 0.5 | Fill #0

## 2021-04-14 DIAGNOSIS — G894 Chronic pain syndrome: Principal | ICD-10-CM

## 2021-04-14 DIAGNOSIS — F411 Generalized anxiety disorder: Principal | ICD-10-CM

## 2021-04-14 DIAGNOSIS — IMO0001 Endocrine disorder in female-to-male transgender person: Principal | ICD-10-CM

## 2021-04-16 MED ORDER — PLECANATIDE 3 MG TABLET
ORAL_TABLET | Freq: Every day | ORAL | 2 refills | 30.00000 days
Start: 2021-04-16 — End: ?

## 2021-04-19 ENCOUNTER — Ambulatory Visit: Admit: 2021-04-19 | Discharge: 2021-04-20 | Payer: PRIVATE HEALTH INSURANCE

## 2021-04-19 DIAGNOSIS — J3089 Other allergic rhinitis: Principal | ICD-10-CM

## 2021-04-19 DIAGNOSIS — J4540 Moderate persistent asthma, uncomplicated: Principal | ICD-10-CM

## 2021-04-19 DIAGNOSIS — J4530 Mild persistent asthma, uncomplicated: Principal | ICD-10-CM

## 2021-04-19 DIAGNOSIS — J309 Allergic rhinitis, unspecified: Principal | ICD-10-CM

## 2021-04-19 DIAGNOSIS — L739 Follicular disorder, unspecified: Principal | ICD-10-CM

## 2021-04-19 DIAGNOSIS — L508 Other urticaria: Principal | ICD-10-CM

## 2021-04-19 DIAGNOSIS — L503 Dermatographic urticaria: Principal | ICD-10-CM

## 2021-04-19 MED ORDER — XOLAIR 150 MG/ML SUBCUTANEOUS SYRINGE
SUBCUTANEOUS | 12 refills | 28.00000 days | Status: CP
Start: 2021-04-19 — End: ?
  Filled 2021-05-11: qty 2, 28d supply, fill #0

## 2021-04-19 MED ORDER — CLINDAMYCIN 1 % LOTION
Freq: Two times a day (BID) | TOPICAL | 0 refills | 30.00000 days | Status: CP
Start: 2021-04-19 — End: 2021-05-19
  Filled 2021-04-21: qty 60, 30d supply, fill #0

## 2021-04-21 ENCOUNTER — Ambulatory Visit: Admit: 2021-04-21 | Discharge: 2021-04-22 | Payer: PRIVATE HEALTH INSURANCE

## 2021-04-21 DIAGNOSIS — F411 Generalized anxiety disorder: Principal | ICD-10-CM

## 2021-04-21 DIAGNOSIS — G894 Chronic pain syndrome: Principal | ICD-10-CM

## 2021-04-21 DIAGNOSIS — F902 Attention-deficit hyperactivity disorder, combined type: Principal | ICD-10-CM

## 2021-04-21 DIAGNOSIS — Z789 Other specified health status: Principal | ICD-10-CM

## 2021-04-21 MED ORDER — LIDOCAINE 5 % TOPICAL PATCH
MEDICATED_PATCH | Freq: Two times a day (BID) | TRANSDERMAL | 0 refills | 5 days | Status: CP
Start: 2021-04-21 — End: 2021-04-26

## 2021-04-21 MED ORDER — OXYCODONE-ACETAMINOPHEN 5 MG-325 MG TABLET
ORAL_TABLET | Freq: Two times a day (BID) | ORAL | 0 refills | 30.00000 days | Status: CP | PRN
Start: 2021-04-21 — End: 2021-05-21
  Filled 2021-04-21: qty 60, 30d supply, fill #0

## 2021-04-21 MED ORDER — DEXTROAMPHETAMINE-AMPHETAMINE 20 MG TABLET
ORAL_TABLET | Freq: Two times a day (BID) | ORAL | 0 refills | 20.00000 days | Status: CP
Start: 2021-04-21 — End: 2021-04-21
  Filled 2021-04-22: qty 60, 30d supply, fill #0

## 2021-04-21 MED ORDER — CLONAZEPAM 0.5 MG TABLET
ORAL_TABLET | Freq: Every day | ORAL | 0 refills | 30.00000 days | Status: CP | PRN
Start: 2021-04-21 — End: 2021-05-21
  Filled 2021-04-21: qty 30, 30d supply, fill #0

## 2021-04-21 MED ORDER — CYCLOBENZAPRINE 5 MG TABLET
ORAL_TABLET | Freq: Three times a day (TID) | ORAL | 0 refills | 3 days | Status: CP | PRN
Start: 2021-04-21 — End: ?
  Filled 2021-04-22: qty 9, 3d supply, fill #0

## 2021-04-21 MED ORDER — TESTOSTERONE CYPIONATE 200 MG/ML INTRAMUSCULAR OIL
SUBCUTANEOUS | 0 refills | 70 days | Status: CP
Start: 2021-04-21 — End: 2021-06-30
  Filled 2021-04-21: qty 2, 70d supply, fill #0

## 2021-04-21 MED FILL — TRULANCE 3 MG TABLET: ORAL | 30 days supply | Qty: 30 | Fill #0

## 2021-04-21 MED FILL — GABAPENTIN 250 MG/5 ML ORAL SOLUTION: ORAL | 34 days supply | Qty: 1000 | Fill #1

## 2021-04-23 MED ORDER — AZITHROMYCIN 200 MG/5 ML ORAL SUSPENSION
Freq: Once | ORAL | 0 refills | 1 days | Status: CP
Start: 2021-04-23 — End: 2021-04-24
  Filled 2021-04-26: qty 30, 1d supply, fill #0

## 2021-04-29 ENCOUNTER — Ambulatory Visit
Admit: 2021-04-29 | Discharge: 2021-04-30 | Payer: PRIVATE HEALTH INSURANCE | Attending: Family Medicine | Primary: Family Medicine

## 2021-05-04 ENCOUNTER — Ambulatory Visit: Admit: 2021-05-04 | Discharge: 2021-05-05 | Payer: PRIVATE HEALTH INSURANCE

## 2021-05-04 ENCOUNTER — Ambulatory Visit
Admit: 2021-05-04 | Discharge: 2021-05-05 | Payer: PRIVATE HEALTH INSURANCE | Attending: Family Medicine | Primary: Family Medicine

## 2021-05-05 NOTE — Unmapped (Signed)
[facility] Specialty Medication Onboarding    Specialty Medication: Xolair 150mg /mL syringe  Prior Authorization: Approved   Financial Assistance: No - copay  <$25  Final Copay/Day Supply: $0 / 28 days    Insurance Restrictions: None     Notes to Pharmacist: N/A    The triage team has completed the benefits investigation and has determined that the patient is able to fill this medication at [facility]. Please contact the patient to complete the onboarding or follow up with the prescribing physician as needed.

## 2021-05-07 NOTE — Unmapped (Signed)
[facility]   Patient Onboarding/Medication Counseling    Vincent Waters is a 40 y.o. adult with chronic urticaria who I am counseling today on re-initiation of therapy.  I am speaking to the patient.    Was a Nurse, learning disability used for this call? No    Verified patient's date of birth / HIPAA.    Specialty medication(s) to be sent: General Specialty: Xolair      Non-specialty medications/supplies to be sent: none      Medications not needed at this time: n/a         Xolair Syringes (omalizumab)    The patient declined counseling on medication administration, missed dose instructions, goals of therapy, side effects and monitoring parameters, warnings and precautions, drug/food interactions and storage, handling precautions, and disposal because they have taken the medication previously. The information in the declined sections below are for informational purposes only and was not discussed with patient.       Medication & Administration     Dosage: Inject 300mg  under the skin every 28 days    Administration:     Prefilled Syringe:   Home administration screening questions:    Has patient ever had an anaphylaxis reaction to Xolair or other agents, such as foods, drugs, biologics, etc? Yes - medication will be administered in clinic by a healthcare professional    Has patient received at least 3 doses in clinic under the supervision of a healthcare proefssional? No - medication will be administered in clinic by a healthcare professional    Does the patient have an Epi-pen to use for possible anaphylaxis reaction? Yes    Injection administration:   ??? Gather all supplies needed for injection on a clean, flat working surface: medication syringe(s) removed from packaging, alcohol swab, sharps container, etc.  ??? Look at the medication label - look for correct medication, correct dose, and check the expiration date  ??? Look at the medication - the liquid in the syringe should appear clear and colorless to Apply 2 g topically four (4) times a day. 100 g 0   ??? EPINEPHrine (EPIPEN) 0.3 mg/0.3 mL injection Inject 0.3 mL (0.3 mg total) under the skin once for 1 dose. 1 each 1   ??? fluticasone propionate (FLONASE) 50 mcg/actuation nasal spray Instill 2 sprays into each nostril two (2) times a day. 16 g 1   ??? gabapentin (NEURONTIN) 250 mg/5 mL oral solution Take 10 mL (500 mg total) by mouth Three (3) times a day. 1000 mL 1   ??? inhalational spacing device Spcr Use as directed with albuterol and symbicort 1 each 1   ??? needle, disp, 18 G 18 Jiraiya x 1 1/2 Ndle For drawing hormone for injection. 25 each 0   ??? needle, disp, 25 Lovelle 25 Theadore x 5/8 Ndle For subcutaneous hormone injection. 25 each 0   ??? nifedipine 0.3% lidocaine 1.5% in petrolatum ointment Apply a pea-size amount to anal area 3 times a day 100 g 0   ??? omalizumab (XOLAIR) 150 mg/mL syringe Inject 2 mL (300 mg total) under the skin every twenty-eight (28) days. 1 mL 12   ??? omeprazole (PRILOSEC) 40 MG capsule TAKE 1 CAPSULE(40 MG) BY MOUTH TWICE DAILY 60 capsule 5   ??? oxyCODONE-acetaminophen (PERCOCET) 5-325 mg per tablet Take 1 tablet by mouth two (2) times a day as needed for pain. 60 tablet 0   ??? plecanatide (TRULANCE) 3 mg Tab Take one tablet by mouth every day 30 tablet  2   ??? syringe, disposable, 1 mL Syrg For subcutaneous hormone injection. 25 each 0   ??? syringe, disposable, 2.5 mL Syrg Use weekly 30 Syringe 2   ??? testosterone cypionate (DEPOTESTOTERONE CYPIONATE) 200 mg/mL injection Inject 0.2 mL (40 mg total) under the skin every seven (7) days. 2 mL 0   ??? triamcinolone (KENALOG) 0.1 % cream        No current facility-administered medications for this visit.       Allergies   Allergen Reactions   ??? Latex Rash and Hives   ??? Penicillins Anaphylaxis     Pt can tolerate amoxicillin   Has patient had a PCN reaction causing immediate rash, facial/tongue/throat swelling, SOB or lightheadedness with hypotension: no   Has patient had a PCN reaction causing severe rash involving mucus membranes or skin necrosis: no  Has patient had a PCN reaction that required hospitalization: unknown  Has patient had a PCN reaction occurring within the last 10 years: no  If all of the above answers are NO, then may proceed with Cephalosporin use.   ??? Shellfish Containing Products Anaphylaxis   ??? Latex, Natural Rubber Rash       Patient Active Problem List   Diagnosis   ??? Adhesive capsulitis of right shoulder   ??? Anaphylactic shock due to adverse food reaction   ??? Attention deficit hyperactivity disorder (ADHD), combined type   ??? Benzodiazepine dependence, continuous (CMS-HCC)   ??? Breast cancer screening, high risk patient   ??? Constipation   ??? Dermographia   ??? Male-to-male transgender person   ??? Gastroesophageal reflux disease   ??? Generalized anxiety disorder   ??? Mild intermittent asthma without complication   ??? Nondiabetic gastroparesis   ??? Allergic rhinitis   ??? Migraine without aura and without status migrainosus, not intractable   ??? Low iron   ??? BRCA2 gene mutation positive   ??? Chronic pain syndrome   ??? Folliculitis   ??? Pain in eye   ??? Vitamin D deficiency   ??? Snoring   ??? Endocrine disorder in male-to-male transgender person   ??? Hemorrhoids   ??? Medication monitoring encounter   ??? Polycythemia   ??? Chronic rhinitis   ??? Sinusitis       Reviewed and up to date in Epic.    Appropriateness of Therapy     Acute infections noted within Epic:  No active infections  Patient reported infection: {Blank single:19197::None,***- patient reported to provider,***- pharmacy reported to provider}    Is medication and dose appropriate based on diagnosis and infection status? {Blank single:19197::Yes,No - evidence provided by prescriber in *** note}    Prescription has been clinically reviewed: {Blank single:19197::Yes,***}      Baseline Quality of Life Assessment      {DiseaseSpecificQOL:73897}    Financial Information     Medication Assistance provided: {sscmedassist:65303}    Anticipated formulation    Warnings & Precautions  ??? Cardiovascular effects: Cerebrovascular events, including transient ischemic attack and ischemic stroke, have been reported.  ??? Eosinophilia and vasculitis: In rare cases, patients may present with systemic eosinophilia, sometimes presenting with clinical features of vasculitis.    ??? Fever/arthralgia/rash: Reports of a constellation of symptoms including fever, arthritis or arthralgia, rash, and lymphadenopathy have been reported with post-marketing use.  ??? Malignant neoplasms: Have been reported rarely with use in short-term studies; impact of Waters-term use is not known.  ??? Parasitic infections: Use with caution and monitor patients at high risk for parasitic infections;  risk of infection may be increased; appropriate duration of continued monitoring following therapy discontinuation has not been established.  ??? Corticosteroid therapy: Gradually taper systemic or inhaled corticosteroid therapy; do not discontinue corticosteroids abruptly following initiation of omalizumab therapy. The combined use of omalizumab and corticosteroids in patients with chronic idiopathic urticaria has not been evaluated.  ??? Latex: Prefilled syringe: The needle cap may contain natural rubber latex.  ??? Appropriate use: Therapy has not been shown to alleviate acute asthma exacerbations; do not use to treat acute bronchospasm, status asthmaticus, or other allergic conditions. Do not use to treat forms of urticaria other than chronic idiopathic urticaria.  ??? Dosing/IgE levels: Dosing for asthma is based on body weight and pretreatment total IgE serum levels. IgE levels remain elevated up to 1 year following treatment; therefore, levels taken during treatment or for up to 1 year following treatment cannot and should not be used as a dosage guide. Dosing in chronic idiopathic urticaria is not dependent on serum IgE (free or total) level or body weight.  ??? Pregnancy Considerations: Omalizumab is a single:19197::No,Yes - ***}    - Patient prefers to have medications discussed with  {Blank single:19197::Patient,Family Member,Caregiver,Other}     - Is the patient or caregiver able to read and understand education materials at a high school level or above? {Blank single:19197::No,Yes}    - Patient's primary language is  {Blank single:19197::English,Spanish,***}     - Is the patient high risk? {sschighriskpts:78327}    - Does the patient require physician intervention or other additional services (i.e. dietary/nutrition, smoking cessation, social work)? {Blank single:19197::No,Yes - ***}      Vincent Waters  [facility] Specialty Pharmacist Medications   Medication Sig Dispense Refill   ??? amitriptyline (ELAVIL) 50 MG tablet Take 1 tablet (50 mg total) by mouth nightly. 30 tablet 2   ??? azelastine (ASTELIN) 137 mcg (0.1 %) nasal spray Instill 2 sprays into each nostril two (2) times a day as needed for rhinitis. 30 mL 11   ??? budesonide-formoteroL (SYMBICORT) 160-4.5 mcg/actuation inhaler Inhale 1 puff by mouth Two (2) times a day. 10.2 g 5   ??? cetirizine (ZYRTEC) 10 MG tablet Take 1 tablet (10 mg total) by mouth Two (2) times a day. 30 tablet 12   ??? cholecalciferol, vitamin D3-10 mcg, 400 unit,, 10 mcg (400 unit) Tab Take 10 mcg by mouth daily.     ??? clindamycin (CLEOCIN T) 1 % lotion      ??? clindamycin (CLEOCIN T) 1 % lotion Apply topically Two (2) times a day. To apply on back 60 mL 0   ??? clonazePAM (KLONOPIN) 0.5 MG tablet Take 1 tablet (0.5 mg total) by mouth daily as needed for anxiety. 30 tablet 0   ??? cyclobenzaprine (FLEXERIL) 5 MG tablet Take 1 tablet (5 mg total) by mouth Three (3) times a day as needed for muscle spasms. 9 tablet 0   ??? dextroamphetamine-amphetamine (ADDERALL) 20 mg tablet Take 1 tablet (20 mg total) by mouth two (2) times a day. 60 tablet 0   ??? diclofenac sodium (VOLTAREN) 1 % gel Apply 2 g topically four (4) times a day. 100 g 0   ??? EPINEPHrine (EPIPEN) 0.3 mg/0.3 mL injection Inject 0.3 mL (0.3 mg total) under the skin once for 1 dose. 1 each 1   ??? fluticasone propionate (FLONASE) 50 mcg/actuation nasal spray Instill 2 sprays into each nostril two (2) times a day. 16 g 1   ??? gabapentin (  NEURONTIN) 250 mg/5 mL oral solution Take 10 mL (500 mg total) by mouth Three (3) times a day. 1000 mL 1   ??? inhalational spacing device Spcr Use as directed with albuterol and symbicort 1 each 1   ??? needle, disp, 18 G 18 Lelon x 1 1/2 Ndle For drawing hormone for injection. 25 each 0   ??? needle, disp, 25 Jeric 25 Giuliano x 5/8 Ndle For subcutaneous hormone injection. 25 each 0   ??? nifedipine 0.3% lidocaine 1.5% in petrolatum ointment Apply a pea-size amount to anal area 3 times a day 100 g 0   ??? omalizumab (XOLAIR) 150 mg/mL syringe Inject 2 mL (300 mg total) under the skin every twenty-eight (28) days. 1 mL 12   ??? omeprazole (PRILOSEC) 40 MG capsule TAKE 1 CAPSULE(40 MG) BY MOUTH TWICE DAILY 60 capsule 5   ??? oxyCODONE-acetaminophen (PERCOCET) 5-325 mg per tablet Take 1 tablet by mouth two (2) times a day as needed for pain. 60 tablet 0   ??? plecanatide (TRULANCE) 3 mg Tab Take one tablet by mouth every day 30 tablet 2   ??? syringe, disposable, 1 mL Syrg For subcutaneous hormone injection. 25 each 0   ??? syringe, disposable, 2.5 mL Syrg Use weekly 30 Syringe 2   ??? testosterone cypionate (DEPOTESTOTERONE CYPIONATE) 200 mg/mL injection Inject 0.2 mL (40 mg total) under the skin every seven (7) days. 2 mL 0   ??? triamcinolone (KENALOG) 0.1 % cream        No current facility-administered medications for this visit.       Allergies   Allergen Reactions   ??? Latex Rash and Hives   ??? Penicillins Anaphylaxis     Pt can tolerate amoxicillin   Has patient had a PCN reaction causing immediate rash, facial/tongue/throat swelling, SOB or lightheadedness with hypotension: no   Has patient had a PCN reaction causing severe rash involving mucus membranes or skin necrosis: no  Has patient had a PCN reaction that required hospitalization: unknown  Has patient had a PCN reaction occurring within the last 10 years: no  If all of the above answers are NO, then may proceed with Cephalosporin use.   ??? Shellfish Containing Products Anaphylaxis   ??? Latex, Natural Rubber Rash       Patient Active Problem List   Diagnosis   ??? Adhesive capsulitis of right shoulder   ??? Anaphylactic shock due to adverse food reaction   ??? Attention deficit hyperactivity disorder (ADHD), combined type   ??? Benzodiazepine dependence, continuous (CMS-HCC)   ??? Breast cancer screening, high risk patient   ??? Constipation   ??? Dermographia   ??? Male-to-male transgender person   ??? Gastroesophageal reflux disease ??? Generalized anxiety disorder   ??? Mild intermittent asthma without complication   ??? Nondiabetic gastroparesis   ??? Allergic rhinitis   ??? Migraine without aura and without status migrainosus, not intractable   ??? Low iron   ??? BRCA2 gene mutation positive   ??? Chronic pain syndrome   ??? Folliculitis   ??? Pain in eye   ??? Vitamin D deficiency   ??? Snoring   ??? Endocrine disorder in male-to-male transgender person   ??? Hemorrhoids   ??? Medication monitoring encounter   ??? Polycythemia   ??? Chronic rhinitis   ??? Sinusitis       Reviewed and up to date in Epic.    Appropriateness of Therapy     Acute infections noted within Epic:  No active  infections  Patient reported infection: None    Is medication and dose appropriate based on diagnosis and infection status? Yes    Prescription has been clinically reviewed: Yes      Baseline Quality of Life Assessment      How many days over the past month did your chronic urticaria  keep you from your normal activities? For example, brushing your teeth or getting up in the morning. ~15 days    Financial Information     Medication Assistance provided: Prior Authorization    Anticipated copay of $0 reviewed with patient. Verified delivery address.     Delivery Information     Scheduled delivery date: 05/11/21    Expected start date: pending appt    Medication will be delivered via [facility] to the temporary address in [place].  This shipment will not require a signature.      Explained the services we provide at [facility] and that each month we would call to set up refills.  Stressed importance of returning phone calls so that we could ensure they receive their medications in time each month.  Informed patient that we should be setting up refills 7-10 days prior to when they will run out of medication.  A pharmacist will reach out to perform a clinical assessment periodically.  Informed patient that a welcome packet, containing information about our pharmacy and other support services, a Notice of Privacy Practices, and a drug information handout will be sent.      The patient or caregiver noted above participated in the development of this care plan and knows that they can request review of or adjustments to the care plan at any time.      Patient or caregiver verbalized understanding of the above information as well as how to contact the pharmacy at [phone] option 4 with any questions/concerns.  The pharmacy is open Monday through Friday 8:30am-4:30pm.  A pharmacist is available 24/7 via pager to answer any clinical questions they may have.    Patient Specific Needs     - Does the patient have any physical, cognitive, or cultural barriers? No    - Does the patient have adequate living arrangements? (i.e. the ability to store and take their medication appropriately) Yes    - Did you identify any home environmental safety or security hazards? No    - Patient prefers to have medications discussed with  Patient     - Is the patient or caregiver able to read and understand education materials at a high school level or above? Yes    - Patient's primary language is  English     - Is the patient high risk? No    - Does the patient require physician intervention or other additional services (i.e. dietary/nutrition, smoking cessation, social work)? No      Vincent Waters  [facility] Specialty Pharmacist

## 2021-05-10 NOTE — Unmapped (Signed)
This patient is receiving Xolair as a clinic administered medication. Patient is aware of copay of $0. The patient prefers all future communication to occur with the clinic. This patient is being disenrolled from our specialty management program and will be added to a patient list for appropriate follow up.

## 2021-05-17 DIAGNOSIS — F902 Attention-deficit hyperactivity disorder, combined type: Principal | ICD-10-CM

## 2021-05-17 DIAGNOSIS — G894 Chronic pain syndrome: Principal | ICD-10-CM

## 2021-05-17 DIAGNOSIS — F411 Generalized anxiety disorder: Principal | ICD-10-CM

## 2021-05-17 MED ORDER — GABAPENTIN 250 MG/5 ML ORAL SOLUTION
Freq: Three times a day (TID) | ORAL | 1 refills | 34 days | Status: CP
Start: 2021-05-17 — End: 2021-06-20
  Filled 2021-05-24: qty 1000, 33d supply, fill #0

## 2021-05-17 MED ORDER — DEXTROAMPHETAMINE-AMPHETAMINE 20 MG TABLET
ORAL_TABLET | Freq: Two times a day (BID) | ORAL | 0 refills | 30 days
Start: 2021-05-17 — End: 2021-06-16

## 2021-05-17 MED ORDER — CLONAZEPAM 0.5 MG TABLET
ORAL_TABLET | Freq: Every day | ORAL | 0 refills | 30 days | PRN
Start: 2021-05-17 — End: 2021-06-16

## 2021-05-17 MED ORDER — OXYCODONE-ACETAMINOPHEN 5 MG-325 MG TABLET
ORAL_TABLET | Freq: Two times a day (BID) | ORAL | 0 refills | 30 days | PRN
Start: 2021-05-17 — End: 2021-06-16

## 2021-05-19 MED ORDER — OXYCODONE-ACETAMINOPHEN 5 MG-325 MG TABLET
ORAL_TABLET | Freq: Two times a day (BID) | ORAL | 0 refills | 30 days | Status: CP | PRN
Start: 2021-05-19 — End: 2021-06-18
  Filled 2021-05-20: qty 60, 30d supply, fill #0

## 2021-05-19 MED ORDER — CLONAZEPAM 0.5 MG TABLET
ORAL_TABLET | Freq: Every day | ORAL | 0 refills | 30.00000 days | Status: CP | PRN
Start: 2021-05-19 — End: 2021-06-18
  Filled 2021-05-20: qty 30, 30d supply, fill #0

## 2021-05-19 MED ORDER — DEXTROAMPHETAMINE-AMPHETAMINE 20 MG TABLET
ORAL_TABLET | Freq: Two times a day (BID) | ORAL | 0 refills | 30 days | Status: CP
Start: 2021-05-19 — End: 2021-06-18
  Filled 2021-05-20: qty 60, 30d supply, fill #0

## 2021-05-19 NOTE — Unmapped (Signed)
Medication Request     Patient Name: Vincent Waters   Caller: Self (Patient)  Have you contacted your pharmacy? yes      Last Visit: 04/29/2021       Medication Name: clonazePAM (KLONOPIN), dextroamphetamine-amphetamine (ADDERALL),  oxyCODONE-acetaminophen (PERCOCET)  Dosage: 0.5 mg, 20 mg, 5-325 mg   Route: Oral (PO)  Frequency: Daily  Day Supply Requested: 62  Pharmacy (Name & Address): [facility]   Pharmacy Phone Number: [phone]

## 2021-05-31 ENCOUNTER — Telehealth: Admit: 2021-05-31 | Discharge: 2021-06-01 | Payer: PRIVATE HEALTH INSURANCE

## 2021-05-31 DIAGNOSIS — F411 Generalized anxiety disorder: Principal | ICD-10-CM

## 2021-05-31 DIAGNOSIS — Z0000 Encounter for general adult medical examination without abnormal findings: Principal | ICD-10-CM

## 2021-05-31 DIAGNOSIS — M7061 Trochanteric bursitis, right hip: Principal | ICD-10-CM

## 2021-05-31 DIAGNOSIS — Z206 Contact with and (suspected) exposure to human immunodeficiency virus [HIV]: Principal | ICD-10-CM

## 2021-05-31 MED ORDER — EMTRICITABINE 200 MG-TENOFOVIR DISOPROXIL FUMARATE 300 MG TABLET
ORAL_TABLET | Freq: Every day | ORAL | 11 refills | 30 days | Status: CP
Start: 2021-05-31 — End: 2022-05-31
  Filled 2021-06-01: qty 30, 30d supply, fill #0
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
## 2021-06-01 ENCOUNTER — Ambulatory Visit
Admit: 2021-06-01 | Discharge: 2021-06-02 | Payer: PRIVATE HEALTH INSURANCE | Attending: Sports Medicine | Primary: Sports Medicine

## 2021-06-01 ENCOUNTER — Institutional Professional Consult (permissible substitution): Admit: 2021-06-01 | Discharge: 2021-06-02 | Payer: PRIVATE HEALTH INSURANCE

## 2021-06-01 DIAGNOSIS — L501 Idiopathic urticaria: Principal | ICD-10-CM

## 2021-06-01 MED ADMIN — omalizumab (XOLAIR) injection 300 mg: 300 mg | SUBCUTANEOUS | @ 17:00:00 | Stop: 2022-04-05

## 2021-06-01 NOTE — Unmapped (Signed)
[facility] NOTE    06/01/2021  PCP: Kern Reap, MD       Vincent Waters is a 40 y.o. 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     ASSESSMENT:  40 yo with bilateral left worse then right lateral hip pain the seems consistent with GTPS/glute tendinopathy    Left side with minimal response to the the CSI in the past and the right side has responded slightly better (53m v 2w)     Needs to see PT but there was an issue with referrall    PLAN:  We have a long talk on the r/b pro/con of CSI and after a good talk to to pain opted to do a left sided GT CSI    Pt agrees that after PT if no change we would image MSK soft tissue to eval for the exact pathology          Relevant Medications    dexamethasone (DECADRON) 4 mg/mL injection 4 mg (Start on 06/01/2021  2:00 PM)    ropivacaine (NAROPIN) 5 mg/mL (0.5 %) injection 6 mL (Start on 06/01/2021  2:00 PM)    triamcinolone acetonide (KENALOG-40) injection 40 mg (Start on 06/01/2021  2:00 PM)    Other Relevant Orders    AMB REFERRAL TO PHYSICAL THERAPY             Vincent Waters  reports that he has never smoked. He has never used smokeless tobacco.    SUBJECTIVE:         Chief Complaint   Patient presents with   ??? Injection(s)     Pt. Here for injection       History of Present Illness  Patient presents to clinic for:      #Left GTPS    ?? 4 x CSI in the past 4 months  ?? 2 x L and 2 x R  ?? Daily percocet doesn't take edge off  ?? Issue with PT referral needs a new one     HISTORY:  I have reviewed the patients problem list, current medications, allergies, past medical & surgical history, social history and updated them as needed.    Patient's last menstrual period was 07/20/2017 (exact date).  Allergies   Allergen Reactions   ??? Latex Rash and Hives   ??? Penicillins Anaphylaxis     Pt can tolerate amoxicillin Has patient had a PCN reaction causing immediate rash, facial/tongue/throat swelling, SOB or lightheadedness with hypotension: no   Has patient had a PCN reaction causing severe rash involving mucus membranes or skin necrosis: no  Has patient had a PCN reaction that required hospitalization: unknown  Has patient had a PCN reaction occurring within the last 10 years: no  If all of the above answers are NO, then may proceed with Cephalosporin use.   ??? Shellfish Containing Products Anaphylaxis   ??? Latex, Natural Rubber Rash         Review of Systems    Review of Systems   Musculoskeletal: Positive for arthralgias, gait problem and myalgias.             OBJECTIVE:    BP 119/88  - Pulse 87  - Temp 36.7 ??C (98.1 ??F)  - Ht 162.6 cm (5'  4.02)  - Wt 70.3 kg (155 lb)  - LMP 07/20/2017 (Exact Date)  - BMI 26.59 kg/m??     Physical Exam  Vitals reviewed.   Musculoskeletal:         General: Tenderness present.      Left hip: Tenderness and bony tenderness present. Decreased range of motion. Decreased strength.      Comments: + TTP at the GT and along the course of the glutes    Lots of pain and weakness with resisted hip AB duction    + ober     Overall hip ROM is normal and all pain is localized over the GT          Greater Trochanteric Injection Procedure Note    Indication(s): Symptomatic GTPS     Primary/Referring Provider:  Kern Reap, MD     Resident Physician:  Noemi Chapel, MD  Attending Physician:  Rolm Bookbinder, MD    Pre-operative Diagnosis: SAA  Post-operative Diagnosis: same    Location(s): Left GT    Allergies:  reviewed allergy section in the chart    Consent:    After discussing the various treatment options for the condition, it was agreed that a corticosteroid injection would be the next step in treatment. The nature of and the indications for a corticosteroid and / or local anaesthetic injection were reviewed in detail with the patient today. The inherent risks of injection including infection, bleeding, allergic reaction, increased pain, incomplete relief or temporary relief of symptoms, alterations of blood glucose levels requiring careful monitoring and treatment as indicated, tendon, ligament or articular cartilage rupture or degeneration, nerve injury, skin depigmentation, and/or fatty atrophy were discussed.     Time-out:  Performed immediately prior to procedure      Anesthesia: ethyl chloride spray    Procedure Details:   Area was prepped with chlorhexidine.  Using G#23 1-1/2 needle, 8mL of solution (0.5% ropivacaine 6mL, 40mg /mL triamcinolone 1mL, 4mg /mL dexamethasone 1mL) was injected to joint.  Simple pressure was applied to injection site for hemostasis, then small dressing was put in place.  The patient tolerated the procedure well.      Specimen(s): None      EBL: None    Condition: Stable    Complications:  None    Post-operative Instructions and Plan:  ?? Start ROM and stretching exercises as discussed.  ?? Start PT if indicated.  ?? Ice to the area 20 min per hour multiple times a day.  ?? Discussed rare, transient phenomenon called steroid flare, which presents as mild warmth and pain the first few days post-injection.   ?? Call or RTC if signs of wound infection ensue (worsening pain, purulent drainage, fluctuance, advancing redness, increasing warmth, fever, chills, sweats, malaise, nausea, vomiting).  ?? Recommended that the patient use ice and home medicines as needed for pain.     Correct patient?: yes    Correct procedure and correct site?: yes    Safety Precautions: Allergies reviewed?: yes    All relevant documents and studies (history and physical, laboratory, pathology, radiology, etc.) available?: yes    Verbal consent completed?: yes    Required items for procedure (implants, devices, special equipment) available: yes    Expiration dates checked?: yes    Site marked, if applicable?:yes          A total of 30-39 (14) minutes combination of face to face and non-face to face with patient and pre, intra, and post encounter on the  day of service were spent with the on this patient.                                     Medical decision making for this encounter was Moderate complexity.                                                                                                                                         [facility]  [place] [facility] [place] [facility]  [id number], [place], [place] [address] ??? Telephone [phone] ??? Fax [phone]  [email or web address]

## 2021-06-01 NOTE — Unmapped (Signed)
Patient in for Xolair injections today. Received Xolair 300mg  subcutaneously, tolerated injections well. Patient is restarting Xolair injections, monitored for 1 hour after injection.  No other complaints at this time.

## 2021-06-01 NOTE — Unmapped (Signed)
Hi Vincent Waters,    It was great to see you today. As we discussed, I've attached some exercises that should help with your hip pain while you're waiting for PT.     I look forward to seeing you at your next visit. In the meantime, for non-urgent questions, please message our clinic on MyChart. I am typically able to respond to these messages within 3-5 business days. For more urgent matters, please call our clinic at [phone]. In the event of a medical emergency, call 911 and/or visit our urgent care or an emergency room.     Take care,    Claudie Revering, MD, MPH (he/him)  Resident Physician, PGY-2  Department of Family Medicine    To schedule an appointment, please call our clinic at [phone].    For the after-hours nurse line, please call [phone]. If you think you are having an emergency, please call 911 or go to your nearest emergency department.      Please allow 2-5 business days for a response to your MyChart message.

## 2021-06-01 NOTE — Unmapped (Signed)
ASSESSMENT:  40 yo with bilateral left worse then right lateral hip pain the seems consistent with GTPS/glute tendinopathy    Left side with minimal response to the the CSI in the past and the right side has responded slightly better (2m v 2w)     Needs to see PT but there was an issue with referrall    PLAN:  We have a long talk on the r/b pro/con of CSI and after a good talk to to pain opted to do a left sided GT CSI    Pt agrees that after PT if no change we would image MSK soft tissue to eval for the exact pathology

## 2021-06-01 NOTE — Unmapped (Signed)
This was a telehealth service where a resident was involved. I was immediately available via telephone/pager.

## 2021-06-03 DIAGNOSIS — Z789 Other specified health status: Principal | ICD-10-CM

## 2021-06-03 NOTE — Unmapped (Signed)
Wrote for new testosterone prescription to be picked up in January, per pt request.    Alver Fisher, MD, MPH (he/him)  Resident Physician, PGY-2  Department of Family Medicine

## 2021-06-04 NOTE — Unmapped (Signed)
Addended by: Marylyn Ishihara on: 06/04/2021 01:54 PM     Modules accepted: Orders

## 2021-06-09 NOTE — Unmapped (Unsigned)
[facility] [place]  OUTPATIENT PHYSICAL THERAPY  06/10/2021          Patient Name: Vincent Waters  Date of Birth:17-Apr-1981  Visit #: 1 (1/10 Progress Note)  Diagnosis:   No diagnosis found.  Referring MD:  Monica Martinez, MD   Initial Evaluation Date: 06/10/2021    Plan of Care Effective Date:         Assessment details:       Pt is a 40 y/o adult who presents with *** chronic pain and decreased mobility.  Pt demonstrates pain, decreased function, decreased ROM, decreased strength, and decreased flexibility.?? There may be a neurological component to his symptoms, with decreased sensation and reflexes in his L LE.  Functionally, Vincent Waters is limited with general mobility, including walking, squatting, lifting, and his balance.  He is a fall risk.    Pt will benefit from skilled Physical Therapy to address deficits, to improve function, and to allow for return to prior level of function.     Patient and PT wore a mask for the entire therapy session.      Impairments: pain, impaired ADLs, decreased strength, core weakness, decreased mobility, decreased range of motion, gait deviation, fall risk and impaired balance    Personal Factors/Comorbidities: 3+  Specific Comorbidities: Polypharmacy, migraines, ADHD, anxiety  Examination of Body Systems: 3 elements  Body System: MSK, Neuro, Integ  Clinical Presentation: evolving  Clinical Decision Making: moderate  Prognosis: good prognosis      Therapy Goals  Goals:      Short Term Goals:  In 2 weeks:  1) Pt will be ind with HEP and able to demo x1 in order to make progressive gains in function between PT visits.    Long Term Goals:  In 6 weeks:  1) Pt will increase FOTO score to >/= to the predicted level demonstrating a significant improvement in function since beginning PT.  2) Pt will have at least 4+/5 strength in the involved LE in all deficient planes of motion in order to return to LE dominant ADLs and recreation without pain.  3) Pt will be able to perform a body weight squat x5 with good biomechanics and without pain in order to return to LE dominant ADLs and recreation without pain.  4) Pt will improve his balance as evidenced by (-) modified tandem stance to decrease his risk of falls.    Plan  Therapy options: will be seen for skilled physical therapy services  Planned therapy interventions: therapeutic activities, therapeutic exercises, manual therapy, neuromuscular re-education, education - patient, education - family/caregiver, body mechanics training, Aquatic Therapy, gait training, home exercise program, taping and postural training    Frequency: 2x week (decreasing frequency as appropriate)  Duration in weeks: 6 weeks  Education provided to: patient.  Education provided: HEP, body mechanics, anatomy, treatment options and plan, role of therapy in Rehabilitation, importance of Therapy, Indications/contraindications to exercises, Importance of Therapy, Symptom management and Treatment options and plan  Education results: demonstrates understanding.  Communication/Consultation: Initial note sent to Referring Provider and Medicare Cert/POC sent to Referring Provider.      Treatment rendered today:       Total Treatment Time: *** Minutes    ***      Therapeutic Exercise: 15 Minutes  Pt educated on pathology, anatomy, activity modification, pain management, prognosis, and plan of care.?? Pt agrees with POC.  Pt educated on HEP and provided with handout.?? Demos independently.  Plank (knees)  SLR   Supine clams light blue TB  Mod tandem stance    Manual Therapy:   Neuromuscular Re-education:   Therapeutic Activity:   Plan details:      HEP:   Plank (knees)  SLR   Supine clams light blue TB  Mod tandem stance  5' walk daily        History of Present Condition      History of Present Condition/Chief Complaint:       40 y.o. year old adult presents to outpatient physical therapy with complaints of ***  Subjective:     Pt reports ***    ***chronic pain and decreased mobility.  It started insidiously with stiffness with walking and exhaustion with stairs starting in October 2019.  This coincided with radiating pain in B knees and hips.  His symptoms started worsening in July 2021.    Vincent Waters takes percocet for symptoms now and has tried heat and massage in the past.  He notes that his pain fluctuates.  Sometimes the pain is excruciating and prevents me from activities and sometimes I feel fine.  There is also decreased sensation in his L foot.    He is limited with mobility and notes that general activity and movement aggravate his symptoms.  He walks with a cane at times due to pain.  Sleeping is also limited and he notes that he has to change positions often in bed at night.  Pt is going to the gym up to 3x/week with a focus on upper body strengthening and TM ambulation.  He tries to walk for 10-15' but notes that this leads to a large increase in his symptoms the following day.    Pt has seen multiple providers regarding his symptoms, including a neurologist, with more testing/examination upcoming in the future, including a visit to rheumatology in March.    Quality of life: fair    Pain  Current pain rating: 4  At best pain rating: 3  At worst pain rating: 10  Location: B hips and knees (L worse than R); low back and shoulders  Quality: stiffness, discomfort, dull ache, aching, numb, numbing, sharp and radiating  Relieving factors: change in position and medications  Aggravating factors: stairs, standing, walking, turning, bending, on the move, performance of leg dominant activites and lifting  Pain Related Behaviors: none  Progression: worsening  Red flags: disturbed sleep.    Precautions and Equipment  Precautions: Fall risk (Latex allergy)  Current Braces/Orthoses: None  Equipment Currently Used: Single point cane  Current functional status: disturbed sleep, limited lifting, limited standing tolerance, limited work capacity, use of assistive device, not age appropriate, limited travel, limited bending, limited recreation, limited exercise, limited household activities, limited walking tolerance and unsteady gait  Social Support  Lives in: two story house  Lives with: significant other    Communication Preference: verbal, written and visual  Barriers to Learning: No Barriers  Work/School: Not working     Diagnostic Tests  X-ray: abnormal  MRI studies: abnormal    Diagnostic Test Comments:       02/23/2021 X-ray: No evidence of inflammatory or erosive arthropathy of the hips.  ??  Question early subchondral changes of the SI joints. 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  ??  Scattered white matter signal abnormality is non-specific but may reflect early small vessel ischemic changes, demyelinating disease or vasculitis.    Treatments  Previous treatment: medication and massage therapy  Current treatment: medication  Patient Goals  Patient goals for therapy: decrease assistance with ADLs, independence with ADLs/IADLs, improved ambulation, improved balance, improved sleep, increased strength and decreased pain  Patient goal: Return to 50% of myself, improve mobility        Objective  FROM 06/10/2021:    Gait Analysis:  Ambulates 100 ft with supervision with stiff gait pattern characterized by wide BOS, B trendelenberg, and decreased trunk rotation.    Static Posture & Observation:   Sitting/Standing: fwd head, rounded shoulders, thoracic kyphosis    Lumbar Spine ROM  Motion No Loss  (0%) Min Loss  (1-33%) Mod Loss  (34-65%) Major Loss  (66-100%) ROM Symptoms    Flexion   60%   X (lumbar)   Extension   60%   X (lumbar)   Sideglide Right         Sideglide Left         Sidebend Right x        Sidebend Left x        Rotation Right x        Rotation Left  x            Strength (MMT):  LE MMT Left   (/5) Right  (/5) HHD  (lbs) Symptoms Movement Analysis   Hip Flex:  (L2) 4- 5  x    Hip Ext: 4- 4-      Hip ABD: 3+ 4-      Hip ADD: 5 5      Hip IR: 5 5 Hip ER: 3+ 4+      Knee Ext:  (L3) 4- 4-  x    Knee Flex: (S2) 4- 5      Ankle DF:  (L4) 4 4      Ankle PF:  (S1) 4 4      Ankle Inv:        Ankle Ev:        Great Toe Ext: (L5)          Sensation:   Light touch: L LE below the knee Impaired (absent in L foot)    Reflexes:   Deep Tendon Reflexes:  L3-L4 - Patella:  diminished; left  1+: trace, or seen only with reinforcement    Functional Outcome Measure:  FOTO: 3/100 (Goal 46/100)      Bilateral squat:  Antalgic, quad dominant    Balance:  NBOS (+) with eyes open  Mod tandem (+) with eyes open          Patient Education:   Throughout session patient educated regarding the following: Role of PT in Rehabilitation, HEP, importance of therapy, treatment plan and Indications/Contraindications to Exercises. Patient demonstrated and verbalized agreement and understanding.  See treatment rendered section above for additional educational topics reviewed with patient, family and/or caregiver.      Communication/consultation with other professionals:  medicare Cert/POC sent to referring practitioner  Initial note sent to referring practitioner     Equipment provided/recommended:   written HEP                        I attest that I have reviewed the above information.  Signed: Pauletta Browns, PT  06/10/2021 3:55 PM

## 2021-06-10 NOTE — Unmapped (Signed)
[facility] Administered Medication Refill Coordination Note      NAME:Vincent Waters DOB: 1980/11/01      Medication: Xolair    Day Supply: 28 days      SHIPPING      Next delivery from [name] [phone]) to [name] for Vincent Waters is scheduled for 12/29.    Clinic contact: [name]    Patient's next nurse visit for administration: 01/10.    We will follow up with clinic monthly for standard refill processing and delivery.      [name]  Specialty Pharmacy Technician

## 2021-06-16 MED FILL — XOLAIR 150 MG/ML SUBCUTANEOUS SYRINGE: SUBCUTANEOUS | 28 days supply | Qty: 2 | Fill #1

## 2021-06-16 NOTE — Unmapped (Signed)
Patient Advice Request     Patient Name: Vincent Waters  Caller: Self (Patient)  Contact Method: Telephone Call: Time- Any Time [phone]   Reason for Call: Gabapentin and ADDERALL is needing authorization by Dr. Arlyce Dice to refill by Friday. The pharmancy is asking for a call back from Dr. Arlyce Dice. The pharmacy has tried several times to reach out to Dr. Arlyce Dice about the patient's medication. [facility] at [facility]   [address], [place] [address]   Phone:  [phone] ??Fax:  [phone]  Previously Discussed: yes  Appointment Offered: No

## 2021-06-17 NOTE — Unmapped (Signed)
Medication Request     Patient Name: Vincent Waters   Caller: Self (Patient)  Have you contacted your pharmacy? yes      Last Visit: 06/01/2021       Medication Name:  gabapentin      Dosage: 250mg   Route: Oral (PO)  Frequency: Daily  Day Supply Requested: 30  Pharmacy (Name & Address): [facility] at [facility]   [address], [place] [address]   Phone:  [phone] ??    Pharmacy needs clarification/explaination on why PCP- Arlyce Dice had the medication depleted before given to pt asap.  Dr. Wrote a additional prescription before schedule refill, so that pt wouldn't be without medication ad start to seize. Pt  Only have enough to last through today.

## 2021-06-17 NOTE — Unmapped (Signed)
Patient called in regards to Dr. Jeannetta Ellis calling pharmacy to advise it is ok for patient to pick up medication 06/18/21.

## 2021-06-18 MED ORDER — DEXTROAMPHETAMINE-AMPHETAMINE 20 MG TABLET
ORAL_TABLET | Freq: Two times a day (BID) | ORAL | 0 refills | 30 days | Status: CP
Start: 2021-06-18 — End: 2021-07-18
  Filled 2021-06-18: qty 60, 30d supply, fill #0

## 2021-06-18 MED ORDER — OXYCODONE-ACETAMINOPHEN 5 MG-325 MG TABLET
ORAL_TABLET | Freq: Two times a day (BID) | ORAL | 0 refills | 30 days | Status: CP | PRN
Start: 2021-06-18 — End: 2021-07-18
  Filled 2021-06-18: qty 60, 30d supply, fill #0

## 2021-06-18 MED ORDER — TESTOSTERONE CYPIONATE 200 MG/ML INTRAMUSCULAR OIL
SUBCUTANEOUS | 0 refills | 70 days | Status: CP
Start: 2021-06-18 — End: 2021-08-27
  Filled 2021-06-18: qty 2, 70d supply, fill #0

## 2021-06-18 MED ORDER — GABAPENTIN 250 MG/5 ML ORAL SOLUTION
Freq: Three times a day (TID) | ORAL | 0 refills | 33 days | Status: CP
Start: 2021-06-18 — End: 2021-07-21
  Filled 2021-06-18: qty 990, 33d supply, fill #0

## 2021-06-18 MED ORDER — CLONAZEPAM 0.5 MG TABLET
ORAL_TABLET | Freq: Every day | ORAL | 0 refills | 30 days | Status: CP | PRN
Start: 2021-06-18 — End: 2021-07-18
  Filled 2021-06-18: qty 30, 30d supply, fill #0

## 2021-06-20 MED ORDER — TESTOSTERONE CYPIONATE 200 MG/ML INTRAMUSCULAR OIL
SUBCUTANEOUS | 0 refills | 70 days | Status: CP
Start: 2021-06-20 — End: 2021-08-29

## 2021-06-22 NOTE — Unmapped (Signed)
[facility]  [address]                                 [place], [place] [address]    [phone]    [facility] did not show for their scheduled Physical Therapy Evaluation. Patient did not call ahead late to cancel. Please contact me if you have any questions or concerns.    Thank you for this referral,     Signed: Marta Lamas, PT  06/22/2021 12:22 PM

## 2021-06-24 ENCOUNTER — Ambulatory Visit
Admit: 2021-06-24 | Discharge: 2021-06-25 | Payer: PRIVATE HEALTH INSURANCE | Attending: Hematology & Oncology | Primary: Hematology & Oncology

## 2021-06-24 DIAGNOSIS — D751 Secondary polycythemia: Principal | ICD-10-CM

## 2021-06-24 LAB — VITAMIN B12: VITAMIN B-12: 297 pg/mL (ref 211–911)

## 2021-06-24 LAB — CBC W/ AUTO DIFF
BASOPHILS ABSOLUTE COUNT: 0 10*9/L (ref 0.0–0.1)
BASOPHILS RELATIVE PERCENT: 0.8 %
EOSINOPHILS ABSOLUTE COUNT: 0 10*9/L (ref 0.0–0.5)
EOSINOPHILS RELATIVE PERCENT: 0.6 %
HEMATOCRIT: 46 % (ref 36.0–46.0)
HEMOGLOBIN: 15.5 g/dL (ref 12.1–15.7)
LYMPHOCYTES ABSOLUTE COUNT: 1.1 10*9/L (ref 1.1–3.6)
LYMPHOCYTES RELATIVE PERCENT: 21.5 %
MEAN CORPUSCULAR HEMOGLOBIN CONC: 33.7 g/dL (ref 32.0–36.0)
MEAN CORPUSCULAR HEMOGLOBIN: 28.2 pg (ref 25.9–32.4)
MEAN CORPUSCULAR VOLUME: 83.7 fL (ref 77.6–95.7)
MEAN PLATELET VOLUME: 7.3 fL (ref 6.8–10.7)
MONOCYTES ABSOLUTE COUNT: 0.3 10*9/L (ref 0.3–0.8)
MONOCYTES RELATIVE PERCENT: 5.2 %
NEUTROPHILS ABSOLUTE COUNT: 3.7 10*9/L (ref 1.8–7.8)
NEUTROPHILS RELATIVE PERCENT: 71.9 %
NUCLEATED RED BLOOD CELLS: 0 /100{WBCs} (ref <=4)
PLATELET COUNT: 340 10*9/L (ref 150–450)
RED BLOOD CELL COUNT: 5.49 10*12/L — ABNORMAL HIGH (ref 4.11–5.36)
RED CELL DISTRIBUTION WIDTH: 15.6 % — ABNORMAL HIGH (ref 12.2–15.2)
WBC ADJUSTED: 5.2 10*9/L (ref 3.6–11.2)

## 2021-06-24 LAB — RETICULOCYTES
RETICULOCYTE ABSOLUTE COUNT: 50.5 10*9/L (ref 23.0–100.0)
RETICULOCYTE COUNT PCT: 0.92 % (ref 0.50–2.17)

## 2021-06-24 LAB — PERIPHERAL BLOOD SMEAR, PATH REVIEW

## 2021-06-24 LAB — IRON & TIBC
IRON SATURATION: 20 % (ref 20–55)
IRON: 69 ug/dL
TOTAL IRON BINDING CAPACITY: 345 ug/dL (ref 250–425)

## 2021-06-24 LAB — FERRITIN: FERRITIN: 38.9 ng/mL

## 2021-06-24 LAB — FOLATE: FOLATE: 5.4 ng/mL (ref >=5.4)

## 2021-06-24 NOTE — Unmapped (Signed)
Referral Request     Patient Name: Vincent Waters   Caller: Self (Patient)  Contact Method: Telephone Call: Time- Any Time [phone]   Referral Requested to: Sports med.  Preferred Facility:   Reason for Referral: Requesting referral for sport medicine, to see Dr Nedra Hai.  for shoulder Frozen Shoulder condition.  Check Epic for Referral Order - Present?: no

## 2021-06-24 NOTE — Unmapped (Signed)
Please see message regarding a referral  request .     Thanks, Riley Lam

## 2021-06-30 ENCOUNTER — Ambulatory Visit
Admit: 2021-06-30 | Discharge: 2021-07-29 | Payer: PRIVATE HEALTH INSURANCE | Attending: Rehabilitative and Restorative Service Providers" | Primary: Rehabilitative and Restorative Service Providers"

## 2021-06-30 ENCOUNTER — Ambulatory Visit
Admit: 2021-06-30 | Payer: PRIVATE HEALTH INSURANCE | Attending: Rehabilitative and Restorative Service Providers" | Primary: Rehabilitative and Restorative Service Providers"

## 2021-06-30 ENCOUNTER — Institutional Professional Consult (permissible substitution): Admit: 2021-06-30 | Discharge: 2021-07-01 | Payer: PRIVATE HEALTH INSURANCE

## 2021-06-30 DIAGNOSIS — L501 Idiopathic urticaria: Principal | ICD-10-CM

## 2021-06-30 MED ADMIN — omalizumab (XOLAIR) injection 300 mg: 300 mg | SUBCUTANEOUS | @ 15:00:00 | Stop: 2022-04-05

## 2021-06-30 NOTE — Unmapped (Signed)
Hematology Consult Note    Referring Physician :  Kern Reap, MD    Primary Care Physician:   Kern Reap, MD    Reason for Consult:  Erythrocytosis    Assessment/Recommendations:   Vincent Waters is a 41 y.o. year old African-American transgender male who we are seeing in consultation at the request of Dr. Arlyce Dice for further  evaluation of erythrocytosis.    1.  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 This is highly unlikely to be a myeloproliferative disorder (P Vera), so I have not pursued additional lab evaluation for this.     Testos levels were very high in July 2021 coinciding with highest recorded H/H (17.5/54.5). Notably, RBC counts were 6.0 in 2019 without a concomitant rise in HB, probably because of residual iron deficiency (low MCV) related to history of heavy menses prior to hysterectomy (was never on po iron). Currently RBC and MCV are closer to normal, without any increase in H/H, suggesting that the patient is under reasonably good control.     2. Upcoming sleep study for probable sleep apnea. This could possibly be a contributor to secondary erythrocytosis       Plans:  1.  No intervention required at present. Patient does not describe symptoms of 'brain fog', headache etc that is sometimes an indicator of higher blood viscosity in older subjects on testosterone. And hematocrit is <ULN for males, which is often used to decide on when to intervene with therapeutic phlebotomy    Should any of these scenarios arise, please refer the patient back for therapeutic phlebotomy trial.     2. RTC prn      History of Present Illness:   Vincent Waters is a 41 y.o. year old African-American transgender male who we are seeing in consultation at the request of Dr. Arlyce Dice for further  evaluation of erythrocytosis.    The patient underwent gender defining surgeries (mastectomies and hysterectomy) in 2019, having started testosterone late 2017. Was previously followed at [facility].     Non smoker    Odd history of intermittent feeling 'like I am coming down with something' that can be associated lymphadenopathy. Received steroids from former primary MD (Dr Clelia Croft) at [facility].     Recent evaluation by Rheumatology for possible scleroderma; this diagnosis could not be supported and he was asked to follow up yearly.     Patient has also been evaluated in Neurology for possible autoimmune muscle disorder. Wishes to have additional follow up but has not heard from Neurology about this. C/o some mobility issues    BRCA+    Medical History:  Patient Active Problem List   Diagnosis   ??? Adhesive capsulitis of right shoulder   ??? Anaphylactic shock due to adverse food reaction   ??? Attention deficit hyperactivity disorder (ADHD), combined type   ??? Benzodiazepine dependence, continuous (CMS-HCC)   ??? Breast cancer screening, high risk patient   ??? Constipation   ??? Dermographia   ??? Male-to-male transgender person   ??? Gastroesophageal reflux disease   ??? Generalized anxiety disorder   ??? Mild intermittent asthma without complication   ??? Nondiabetic gastroparesis   ??? Allergic rhinitis   ??? Migraine without aura and without status migrainosus, not intractable   ??? Low iron   ??? BRCA2 gene mutation positive   ??? Chronic pain syndrome   ??? Folliculitis   ???  Pain in eye   ??? Vitamin D deficiency   ??? Snoring   ??? Endocrine disorder in male-to-male transgender person   ??? Hemorrhoids   ??? Medication monitoring encounter   ??? Polycythemia   ??? Chronic rhinitis   ??? Sinusitis   ??? 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 CHOLECYSTECTOMY     ??? HYSTERECTOMY Bilateral     08/2017   ??? MASTECTOMY      04/2018   ??? OOPHORECTOMY  2005    laparotomy   ??? PR CYSTOURETHROSCOPY N/A 08/30/2017    Procedure: CYSTOURETHROSCOPY (SEPARATE PROCEDURE);  Surgeon: Forbes Cellar, MD;  Location: [facility] OR [facility];  Service: Advanced Laparoscopy   ??? PR EXCISION TURBINATE,SUBMUCOUS Bilateral 01/04/2021    Procedure: SUBMUCOUS RESECTION INFERIOR TURBINATE, PARTIAL OR COMPLETE, ANY METHOD;  Surgeon: Adam Swaziland Kimple, MD;  [facility];  Service: ENT   ??? PR LAPAROSCOPY W TOT HYSTERECTUTERUS <=250 GRAM  W TUBE/OVARY N/A 08/30/2017    Procedure: LAPAROSCOPY, SURGICAL, WITH TOTAL HYSTERECTOMY, FOR UTERUS 250 G OR LESS; W/REMOVAL TUBE(S) AND/OR OVARY(S);  Surgeon: Forbes Cellar, MD;  Location: [facility];  Service: Advanced Laparoscopy   ??? PR MASTECTOMY, SIMPLE, COMPLETE Bilateral 01/09/2020    Procedure: MASTECTOMY, SIMPLE, COMPLETE;  Surgeon: Carola Rhine, MD;  Location: MAIN OR [facility];  Service: Plastics   ??? PR REPAIR OF NASAL SEPTUM N/A 01/04/2021    Procedure: SEPTOPLASTY OR SUBMUCOUS RESECTION, WITH OR WITHOUT CARTILAGE SCORING, CONTOURING OR REPLACEMENT WITH GRAFT;  Surgeon: Adam Swaziland Kimple, MD;  Location: [facility] OR [facility];  Service: ENT   ??? PR UPPER GI ENDOSCOPY,BIOPSY N/A 08/13/2019    Procedure: UGI ENDOSCOPY; WITH BIOPSY, SINGLE OR MULTIPLE;  Surgeon: Alfred Levins, MD;  [facility]: [facility] PROCED[facility];  Service: Gastroenterology   ??? SINUS SURGERY           Medications:   Current Outpatient Medications   Medication Sig Dispense Refill   ??? budesonide-formoteroL (SYMBICORT) 160-4.5 mcg/actuation inhaler Inhale 1 puff by mouth Two (2) times a day. 10.2 g 5   ??? cetirizine (ZYRTEC) 10 MG tablet Take 1 tablet (10 mg total) by mouth Two (2) times a day. 30 tablet 12   ??? clindamycin (CLEOCIN T) 1 % lotion      ??? clonazePAM (KLONOPIN) 0.5 MG tablet Take 1 tablet (0.5 mg total) by mouth daily as needed for anxiety. 30 tablet 0   ??? cyclobenzaprine (FLEXERIL) 5 MG tablet Take 1 tablet (5 mg total) by mouth Three (3) times a day as needed for muscle spasms. 9 tablet 0   ??? dextroamphetamine-amphetamine (ADDERALL) 20 mg tablet Take 1 tablet (20 mg total) by mouth two (2) times a day. 60 tablet 0   ??? diclofenac sodium (VOLTAREN) 1 % gel Apply 2 g topically four (4) times a day. 100 g 0   ??? emtricitabine-tenofovir, TDF, (TRUVADA) 200-300 mg per tablet Take 1 tablet by mouth daily. 30 tablet 11   ??? fluticasone propionate (FLONASE) 50 mcg/actuation nasal spray Instill 2 sprays into each nostril two (2) times a day. 16 g 1   ??? gabapentin (NEURONTIN) 250 mg/5 mL oral solution Take 10 mL (500 mg total) by mouth Three (3) times a day. 990 mL 0   ??? inhalational spacing device Spcr Use as directed with albuterol and symbicort 1 each 1   ??? needle,  disp, 18 G 18 Ozie x 1 1/2 Ndle For drawing hormone for injection. 25 each 0   ??? needle, disp, 25 Tameron 25 Chinedu x 5/8 Ndle For subcutaneous hormone injection. 25 each 0   ??? nifedipine 0.3% lidocaine 1.5% in petrolatum ointment Apply a pea-size amount to anal area 3 times a day 100 g 0   ??? omalizumab (XOLAIR) 150 mg/mL syringe Inject 2 mL (300 mg total) under the skin every twenty-eight (28) days. 2 mL 12   ??? oxyCODONE-acetaminophen (PERCOCET) 5-325 mg per tablet Take 1 tablet by mouth two (2) times a day as needed for pain. 60 tablet 0   ??? plecanatide (TRULANCE) 3 mg Tab Take one tablet by mouth every day 30 tablet 2   ??? syringe, disposable, 1 mL Syrg For subcutaneous hormone injection. 25 each 0   ??? syringe, disposable, 2.5 mL Syrg Use weekly 30 Syringe 2   ??? testosterone cypionate (DEPOTESTOTERONE CYPIONATE) 200 mg/mL injection Inject 0.2 mL (40 mg total) under the skin every seven (7) days. 2 mL 0   ??? triamcinolone (KENALOG) 0.1 % cream      ??? azelastine (ASTELIN) 137 mcg (0.1 %) nasal spray Instill 2 sprays into each nostril two (2) times a day as needed for rhinitis. 30 mL 11   ??? cholecalciferol, vitamin D3-10 mcg, 400 unit,, 10 mcg (400 unit) Tab Take 10 mcg by mouth daily. (Patient not taking: Reported on 06/24/2021)     ??? EPINEPHrine (EPIPEN) 0.3 mg/0.3 mL injection Inject 0.3 mL (0.3 mg total) under the skin once for 1 dose. 1 each 1   ??? omeprazole (PRILOSEC) 40 MG capsule TAKE 1 CAPSULE(40 MG) BY MOUTH TWICE DAILY (Patient not taking: Reported on 06/24/2021) 60 capsule 5     Current Facility-Administered Medications   Medication Dose Route Frequency Provider Last Rate Last Admin   ??? dexamethasone (DECADRON) 4 mg/mL injection 4 mg  4 mg Intra-articular Once Parke Poisson, MD       ??? omalizumab [name]) injection 300 mg  300 mg Subcutaneous Q28 Days [name], MD   300 mg at 06/01/21 1135   ??? ropivacaine (NAROPIN) 5 mg/mL (0.5 %) injection 6 mL  6 mL Intra-articular Once Parke Poisson, MD       ??? triamcinolone acetonide (KENALOG-40) injection 40 mg  40 mg Intra-articular Once Parke Poisson, MD           Allergies:  Latex; Penicillins; Shellfish containing products; and Latex, natural rubber      Social History     Socioeconomic History   ??? Marital status: Single     Spouse name: None   ??? Number of children: None   ??? Years of education: None   ??? Highest education level: None   Tobacco Use   ??? Smoking status: Never   ??? Smokeless tobacco: Never   Vaping Use   ??? Vaping Use: Never used   Substance and Sexual Activity   ??? Alcohol use: Not Currently   ??? Drug use: Yes     Types: Marijuana     Comment: occasionaly   ??? Sexual activity: Yes     Partners: Female   Social History Narrative    ** Merged History Encounter **          Lives with male transgender partner    Family History:  family history includes Breast cancer in his maternal grandmother and sister; Cancer in his mother and sister; Cataracts in his  maternal grandmother; No Known Problems in his father; Ovarian cancer in his maternal grandmother.   He indicated that his mother is alive. He indicated that his father is alive. He indicated that the status of his sister is unknown. He indicated that the status of his maternal grandmother is unknown.    No FH of blood disorders, erythrocytosis    Review of Systems:  As per HPI, otherwise negative x 10 systems.    Objective :  Vitals:    06/24/21 0848   BP: 118/82   Pulse: 83   Temp: 36.8 ??C (98.2 ??F)   TempSrc: Temporal   SpO2: 99%   Weight: 70.8 kg (156 lb)   Height: 162.6 cm (5' 4)       Physical Exam:    GEN: well-appearing man in no acute distress  HENT: oropharynx is without erythema or exudate, mucous membranes are moist, no oral lesions, dentition is normal, no nasal discharge, nasal mucosae are pink  EYES: pupils equal, round, and reactive to light, no scleral injection or icterus, conjunctivae are pink with no discharge  NECK: supple, without enlargements  LYMPH NODES: no enlarged lymph nodes palpated CV: heart is regular rate and rhythm, no murmurs or rubs; radial pulses 2+ bilaterally  PULM: clear to auscultation bilaterally without wheezes or rales; breathing is non-labored  ABD: soft, non-tender, non-distended, normal bowel sounds, no hepatosplenomegaly  EXTREMITIES: no lower extremity edema  MSK: no joint effusions or muscle tenderness  SKIN: no rash, bruising or petechiae noted on exposed skin  NEURO: gait normal, sensation is grossly intact, motor function is grossly intact      Test Results  Results for orders placed or performed in visit on 06/24/21   Vitamin B12 Level   Result Value Ref Range    Vitamin B-12 297 211 - 911 pg/ml   Folate Level   Result Value Ref Range    Folate 5.4 >=5.4 ng/mL   Pathologist Smear Review   Result Value Ref Range    Pathologist Smear Interpretation To Be Accessioned - Case Report to Follow Confirmed by Hemepath Fellow, Confirmed by Hemepath Specialist/Senior Tech, Confirmed by Core Specialist/Senior Tech, To Be Accessioned - Case Report to Follow   Ferritin   Result Value Ref Range    Ferritin 38.9 Male: 7.3-270.7, Male: 10.5-307.3 ng/mL   Iron Level and TIBC   Result Value Ref Range    Iron 69 Male: 50-170, Male: 65-175 ug/dL    TIBC 161 096 - 045 ug/dL    Iron Saturation (%) 20 20 - 55 %   Reticulocytes   Result Value Ref Range    Reticulocyte Auto % 0.92 0.50 - 2.17 %    Absolute Auto Reticulocyte 50.5 23.0 - 100.0 10*9/L   Hematopathology Order   Result Value Ref Range    Diagnosis       Peripheral blood, smear review  -  No significant morphologic or numeric abnormality    This electronic signature is attestation that the pathologist personally reviewed the submitted material(s) and the final diagnosis reflects that evaluation.      Clinical History       The patient is a 41 year old TM with erythrocytosis. Pathologist's review of the peripheral blood smear is requested.       Gross Description       Received: EDTA tube of peripheral blood with a request for Pathologist review of blood smear. A blood smear is prepared and stained for review. Flow cytometry is not performed.  Microscopic Description       Microscopic examination substantiates the above diagnosis.    Peripheral Blood:  Platelets: Adequate in number   Erythroid: No significant morphologic abnormality identified  Leukocytes: Unremarkable; negative for blasts, overtly dysplastic granulocytes, morphologically abnormal lymphoid cells, and circulating plasma cells         Resident Physician: None Assigned      EMBEDDED IMAGES      Disclaimer       Unless otherwise specified, specimens are preserved using 10% neutral buffered formalin. For cases in which immunohistochemical and/or in-situ hybridization stains are performed, the following statement applies: Appropriate controls for each stain (positive controls with or without negative controls) have been evaluated and stain as expected. These stains have not been separately validated for use on decalcified specimens and should be interpreted with caution in that setting. Some of the reagents used for these stains may be classified as analyte specific reagents (ASR). Tests using ASRs were developed, and their performance characteristics were determined, by the Anatomic Pathology Department [facility]). They have not been cleared or approved by the [place] Food and Drug Administration (FDA). The FDA does not require these tests to go through premarket FDA review. These tests are used for clinical purposes. They should not be regarded as investigational or for research. This laboratory is certified under the Clinical Laboratory Improvement Amendments (CLIA) as qualified to perform high complexity clinical laboratory testing.     CBC w/ Differential   Result Value Ref Range    WBC 5.2 3.6 - 11.2 10*9/L    RBC 5.49 (H) 4.11 - 5.36 10*12/L    HGB 15.5 12.1 - 15.7 g/dL    HCT 16.1 09.6 - 04.5 %    MCV 83.7 77.6 - 95.7 fL    MCH 28.2 25.9 - 32.4 pg    MCHC 33.7 32.0 - 36.0 g/dL    RDW 40.9 (H) 81.1 - 15.2 %    MPV 7.3 6.8 - 10.7 fL    Platelet 340 150 - 450 10*9/L    nRBC 0 <=4 /100 WBCs    Neutrophils % 71.9 %    Lymphocytes % 21.5 %    Monocytes % 5.2 %    Eosinophils % 0.6 %    Basophils % 0.8 %    Absolute Neutrophils 3.7 1.8 - 7.8 10*9/L    Absolute Lymphocytes 1.1 1.1 - 3.6 10*9/L    Absolute Monocytes 0.3 0.3 - 0.8 10*9/L    Absolute Eosinophils 0.0 0.0 - 0.5 10*9/L    Absolute Basophils 0.0 0.0 - 0.1 10*9/L     Pertinent test results:   ??  Component      Latest Ref Rng & Units 01/22/2020 05/19/2020 06/05/2020 08/25/2020   WBC      3.6 - 11.2 10*9/L 5.8 ?? 7.5 4.7   RBC      4.11 - 5.36 10*12/L 6.43 (H) ?? 6.21 (H) 6.33 (H)   HGB      12.1 - 15.7 g/dL 91.4 (H) 78.2 (H) 95.6 (H) 16.8 (H)   HCT      36.0 - 46.0 % 54.4 (H) 52.1 (H) 51.8 (H) 50.0 (H)   MCV      77.6 - 95.7 fL 84.6 ?? 83.4 78.9   MCH      25.9 - 32.4 pg 27.2 ?? 27.2 26.6   MCHC      32.0 - 36.0 g/dL 21.3 ?? 08.6 57.8   RDW  12.2 - 15.2 % 14.1 ?? 14.9 14.9   MPV      6.8 - 10.7 fL 9.0 ?? 9.0 7.9   Platelet      150 - 450 10*9/L 303 ?? 368 306   nRBC      <=4 /100 WBCs ?? ?? ?? 0   Neutrophils %      % ?? ?? 78.5 63.8   Lymphocytes %      % ?? ?? 15.5 27.3   Monocytes %      % ?? ?? 4.4 6.8   Eosinophils %      % ?? ?? 0.4 1.4   Basophils %      % ?? ?? 0.4 0.7   Absolute Neutrophils 1.8 - 7.8 10*9/L ?? ?? 5.9 3.0   Absolute Lymphocytes      1.1 - 3.6 10*9/L ?? ?? 1.2 (L) 1.3   Absolute Monocytes      0.3 - 0.8 10*9/L ?? ?? 0.3 0.3   Absolute Eosinophils      0.0 - 0.5 10*9/L ?? ?? 0.0 0.1   Absolute Basophils      0.0 - 0.1 10*9/L ?? ?? 0.0 0.0

## 2021-06-30 NOTE — Unmapped (Signed)
Patient in for Xolair injection #2 today. Received Xolair 300 mg subcutaneously, tolerated injections well. Patient waited 30 min after injection. No other complaints at this time.     Specialty

## 2021-06-30 NOTE — Unmapped (Signed)
[facility] [place]  OUTPATIENT PHYSICAL THERAPY  06/30/2021  Note Type: Evaluation       Patient Name: Vincent Waters  Date of Birth:[birth year]  Diagnosis:   Encounter Diagnoses   Name Primary?   ??? Hip pain, bilateral    ??? Chronic bilateral low back pain with left-sided sciatica Yes   ??? Weakness of both lower extremities    ??? Decreased mobility      Referring MD:  Parke Poisson, MD      Date of Onset of Impairment-12/28/2020  Date PT Care Plan Established or Reviewed-06/30/2021  Date PT Treatment Started-06/30/2021   Plan of Care Effective Date:     Assessment/Plan:    Assessment details:       Vincent Waters is a 41 y.o. adult who presents with bilateral hip and back pain.  Signs and symptoms consistent with low back pain with positive response to extension based exercise. Does have cogwheeling with LE extension and inconsistent dermatomal sensory changes. Functional impairments include difficulty with stooping/squatting and limited ability to retrurn to work.  May benefit from motivational interviewing to encourage autonomy of condition due to history and recurrance. This patient requires skilled physical therapy services to address the described impairments in order decrease pain and dysfunction and return to work duties.     Visit #: 1    Patient and therapist wore their mask throughout session.    Impairments: decreased range of motion, decreased strength, impaired balance, impaired flexibility, joint restriction, pain, trigger points, decreased endurance, decreased mobility, gait deviation and poor awareness of body mechanics    Personal Factors/Comorbidities: 3  Specific Comorbidities: percocet use, return to work soon, gender status, latex allergy  Examination of Body Systems: 1-2 elements  Body System: MSK, neuromuscular  Clinical Presentation: unstable  Clinical Decision Making: moderate  Prognosis: good prognosis  Positive Prognosis Rationale: motivated for treatment and response to trial tx.  Negative Prognosis Rationale: Pain Status.    Therapy Goals  Goals:      In 8 weeks:  1. Pt will increase FOTO score to 44 demonstrating a significant improvement in function and perception of symptoms since beginning PT.  3. Pt will be able to sit  for 30 minutes with 0/10 pain and without need for cueing and/or adaptive strategies.  4. Return to work in 2 months without need for light duty.    Plan  Therapy options: will be seen for skilled physical therapy services  Planned therapy interventions: Balance Training, Civil engineer, contracting, Education - Geophysicist/field seismologist, Education - Patient, Scientist, product/process development, Investment banker, operational, Functional Mobility, Home Exercise Program, Therapeutic Activities, Therapeutic Exercises, Teacher, early years/pre, Neuromuscular Re-education, Manual Therapy and Dry Needling    Frequency: 1x week  Duration in weeks: 8  Education provided to: patient.  Education provided: Surveyor, minerals, HEP, Back care, Body awareness, Posture, Self-soft tissue mobilization, Symptom management and Treatment options and plan  Education results: needs reinforcement and verbalized good understanding.  Communication/Consultation: Discussed POC with PT.    Total Session Time: 45  Treatment rendered today:       ______________________________________________________________________  PT eval: 20 minutes    Manual Therapy: 5 minutes  prone lumbar PAs    Therapeutic Exercise: 15 minutes  prone press up  hip extension in standing  hand heel rocks  bridges  ______________________________________________________________________  Plan details:      Education provided today as part of PT session included HEP, review of PT POC, and anticipated progression with  PT services. HEP provided and reviewed.         Subjective:   History of Present Condition      History of Present Condition/Chief Complaint:       Vincent Waters is a 41 y.o. adult who presents to PT with c/o hip pain. Describes symptom onset approximately 1 year ago.  Symptoms aggravated by laying on the hips and improve with injections but only help for 1-2 months. Pain radiates to front shin and ankle    Quality of life: good    Pain  Current pain rating: 10  At worst pain rating: 10  Quality: aching and stabbing  Relieving factors: standing  Aggravating factors: sitting (pressure)  Pain Related Behaviors: none  Progression: no change  Red flags: none.    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     Tests     Functional Assessment   Focus on Therapeutic Outcomes   FOTO Score (out of 100): 19  Additional Tests Details:   RFIS: increased pain/peripheralization  REIS: centralization                                  I attest that I have reviewed the above information.  Signed: Marta Lamas, PT  06/30/2021 3:43 PM

## 2021-07-02 NOTE — Unmapped (Signed)
Pre called for appointment

## 2021-07-06 DIAGNOSIS — F419 Anxiety disorder, unspecified: Principal | ICD-10-CM

## 2021-07-06 NOTE — Unmapped (Signed)
Established Patient Phone Visit Note    Behavioral Health Telephone Assessment & Management Service       Date of Service:  07/06/2021    Duration of call: 30 minutes    Contact Information  Person Contacted: patient  Contact Phone number: [phone] (home) [phone] (work) Phone Outcome: Spoke with patient  Is there someone else in the room? no    ASSESSMENT  PT with severe panic attacks in the last 30 days related to longstanding issues with anxiety, in the context of other psychosocial concerns including a history of alcoholism, attentional issues and mood regulation issues, who would benefit from intervention for ameliorating severity and frequency of panic attacks in primary care while working on finding specialty care for his other concerns. 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 Psychiatry consult is likely needed to consider possible alternatives for a future cross-taper.  Intervention Type: CBT, Assessment    ICD-10-CM   1. Anxiety  F41.9       PLAN   1. F/U with behavioral health consultant in 2 weeks   2. Medications: No changes   3. Behavioral recommendation(s):    A. PT to return to work on triggers and response to physiologic anxiety, specifically short-cutting the connection between physiologic response and environmental and cognitive triggers    SUBJECTIVE  Patient here for anxiety, related to longstanding concerns. The patient reported the following symptoms/ concerns: anxiety of longstanding nature with panic attacks about every other day for the last 30 days; recent trigger precipitated the panic attacks. Prior to that panic attacks were still present but less frequent. PT has been trialed off clonazepam in the past with poor effects (hospitalization) - we discussed why long-term use of clonazepam is not indicated. The PT has had multiple trials on various medications in various classes over the years and is waiting on a [facility] psych consult - no word yet. PT has been put on waiting lists for psychotherapy. PT has had prior therapy with positive effect. PT self-treated alcohol and was last drunk two years ago. He currently has found a way to drink in moderation. Main historical triggers of anxiety have included car anxiety and persecution for being trans male. No other concern today. Progress towards prior plan: NA  Duration of problem: several years  Current severity of problem: severe    OBJECTIVE  Referred by: Kern Reap, MD  Orientation & Cognition: Oriented times 3. Associations logical and no signs of thought disorder.  Mood/ Affect: normal  Appearance: N/A.  Harm to self or others: None currently  Substance use/ misuse: alcohol and moderate drinker at present  Psychiatric Medication use: Adherent all of the time.  Completion of screening measures: N/A    -----------------------------------------------------------------------------    The patient was informed of the following characteristics of their care within this medical home: a. Behavioral health providers operate as consultants and not stand-alone providers of care, b. All information discussed with team members as applicable/ appropriate will be documented in the shared electronic health record and visible by all team members. The patient provided verbal consent to treatment.      Electronically signed by Mena Goes        The patient reports they are currently: at home. I spent 30 minutes on the phone with the patient on the date of service. I spent an additional 5 minutes on pre- and post-visit  activities on the date of service.     The patient was physically located in [place] or a state in which I am permitted to provide care. The patient and/or parent/guardian understood that s/he may incur co-pays and cost sharing, and agreed to the telemedicine visit. The visit was reasonable and appropriate under the circumstances given the patient's presentation at the time.    The patient and/or parent/guardian has been advised of the potential risks and limitations of this mode of treatment (including, but not limited to, the absence of in-person examination) and has agreed to be treated using telemedicine. The patient's/patient's family's questions regarding telemedicine have been answered.     If the visit was completed in an ambulatory setting, the patient and/or parent/guardian has also been advised to contact their provider???s office for worsening conditions, and seek emergency medical treatment and/or call 911 if the patient deems either necessary.

## 2021-07-09 NOTE — Unmapped (Signed)
[facility]  [facility]  [address]                                 [place], [place] [address]    [phone]    [facility] did not attend their scheduled Physical Therapy follow-up session. Patient did not call ahead late to cancel. Please contact me if you have any questions or concerns.    Thank you,     Signed: Marta Lamas, PT  07/09/2021 9:40 AM

## 2021-07-09 NOTE — Unmapped (Signed)
[facility] Administered Medication Refill Coordination Note      NAME:[facility] DOB: May 31, 1981      Medication: Xolair    Day Supply: 28 days      SHIPPING      Next delivery from [facility] [phone]) to [facility] for [facility] is scheduled for 02/02.    Clinic contact: [name]    Patient's next nurse visit for administration: 02/08.    We will follow up with clinic monthly for standard refill processing and delivery.      Zakariah Urwin Samella Parr  Specialty Pharmacy Technician

## 2021-07-11 NOTE — Unmapped (Unsigned)
[facility]    Chief complaint: follow up of hives, asthma, allergic rhinitis. Last seen 03/2021.    Assessment and Plan:   [facility]) is a 41 y.o. transgender male with anxiety and migraine headaches who was seen for follow-up visit for allergies, asthma, and chronic urticaria.    Allergic rhinoconjunctivitis, sensitized to dust mite and mold:   Vincent [facility] reports symptoms of ocular itchiness, post nasal drip. For his ocular symptoms, we recommend use of Pataday 1 drop per eye daily with the use of an eye lubricant. Also recommended Allegra as below. I also discussed restarting Flonase sensimist 1 squirts in each nostril BID. Went over proper technique. Also recommended Astelin 1 spray twice a day for additional control. He will also do daily nasal rinses prior to nasal sprays. Hopeful that Xolair, as below, will also help with allergic rhinitis.    Chronic urticaria, poorly controlled:   [name]'s clinical history is consistent with chronic urticaria. Chronic idiopathic urticaria (or chronic spontaneous urticaria) is characterized by recurrent, fleeting episodes of urticaria, with or without angioedema, on most days of the week. In the majority of cases, no external cause can be identified. Last visit labs were unremarkable except for a low TSH. He will follow this up with his PCP and endocrinologist . His hives were previously well controlled with Xolair. We will work on restarting Xolair 300mg  q28 days as he is intolerant to high dose Zyrtec secondary to side effects of dizziness and drowsiness. In the meantime, he will start Allegra 2 pills in the morning, 2 pills in the evening.     Moderate-persistent asthma, uncontrolled:   [name]'s asthma is not well controlled. Will step up Symbicort 160-4.5 mcg to 2 puffs BID with chamber. I ordered PFTs to assess his lung function. I am hopeful that Xolair will help with his asthma as it has in the past.    Folliculitis on back  Will prescribe a short course of clinamycin 1%. He will continue f/u with PCP for management    Profuse sweating  Likely related to testosterone therapy. He will follow up with PCP or endocrinologist regarding potential treatment     No follow-ups on file.    The patient was discussed with Dr. [name] who agrees with the assessment and plan.  --  [name], MD  Allergy/Immunology Fellow  [facility]     Subjective:   Last clinic visit: 03/2021    To review his history: [facility] reported itchy and red eyes that occur throughout the day. He reported that his symptoms occur throughout the year. In the past, he tried Primary school teacher but it caused his eyes to burn. His last allergy testing was in 06/2018 and was positive to dust mite and molds. He has tried Flonase but stopped using it due to nasal burning. He has not tried [employer] or Astelin. He reported history of recurrent hives on his arms and buttocks. He reports worsening symptoms with heat. He denies swelling and denied NSAID use. He has tried Careers adviser and Zyrtec in the past. He was previously on Xolair and tolerated this well. He has been off Xolair since around 2019. He reports having hives daily. He denies weight loss or fevers.  In January, he stopped taking Symbicort. He reports having difficulty getting in contact with our clinic. He does report that his asthma was better controlled when he was taking Symbicort. He reports using his rescue inhaler once daily for chest tightness.  Interval history:  -since being on testosterone for 5 years, has been having chronic hives. Also has folliculitis and xerosis. Also sweats all the time, even during the cold weather and this worsens hives. He use to be on Xolair every 2 weeks for 2 years in [place], but moved so had to stop during the transition. Last on it in 2019 and it controlled hives; ever since stopping hives came back. Hives are worse during the cold morning, warm weather in the evening and with showers. He is taking Cetirizine 10mg  twice a day. It helps, but still getting hives. He tried taking 20 mg BID but it caused him to be groggy. Has not tried Careers adviser. He use to be on a lot of prednisone for hives. He gets hives buttocks, arms, chest, groin.     -for his asthma, he is on 1 puffs BID Symbicort with a chamber. He had 8-9 episodes of flares with chest tighting and wheezing past 3 months. Responds immediately to Albuterol. Last happened 2 weeks ago. Unclear triggers, sometimes when hives flare. Not with activity. He does not feel limited by his asthma. No steroids or ED visit. No night time symptoms. Tried Singulair in the past, but it did not work. He also felt like his asthma was under better control and his allergies on Xolair.     -allergic rhinitis: positive to mold, dust mite in 2020, IgE 420. Had surgery with ENT 12/2020 (previous surgery 2019). The patient is now s/p septoplasty and inferior turbinate resection on 01/04/2021. Has helped with his breathing, but feels like he gets worsening of post nasal drip. Symptoms all year round. Post nasal drip triggers cough. He is not on nasal sprays anymore after surgery, unsure why he stopped them. Also getting dry eyes that burn. Pataday did not help.    Interval history since October:    -started Xolair, received 2 injections, last 1/11    ROS: Pertinent positive and negatives included in the above HPI. All other systems reviewed are negative.    Past Medical/Surgical/Allergy/Immunization/Social/Family History:  Reviewed and updated in Epic since last visit on 09/2018.     Current Outpatient Medications   Medication Sig Dispense Refill   ??? azelastine (ASTELIN) 137 mcg (0.1 %) nasal spray Instill 2 sprays into each nostril two (2) times a day as needed for rhinitis. 30 mL 11   ??? budesonide-formoteroL (SYMBICORT) 160-4.5 mcg/actuation inhaler Inhale 1 puff by mouth Two (2) times a day. 10.2 g 5   ??? cetirizine (ZYRTEC) 10 MG tablet Take 1 tablet (10 mg total) by mouth Two (2) times a day. 30 tablet 12   ??? clindamycin (CLEOCIN T) 1 % lotion      ??? clonazePAM (KLONOPIN) 0.5 MG tablet Take 1 tablet (0.5 mg total) by mouth daily as needed for anxiety. 30 tablet 0   ??? cyclobenzaprine (FLEXERIL) 5 MG tablet Take 1 tablet (5 mg total) by mouth Three (3) times a day as needed for muscle spasms. 9 tablet 0   ??? dextroamphetamine-amphetamine (ADDERALL) 20 mg tablet Take 1 tablet (20 mg total) by mouth two (2) times a day. 60 tablet 0   ??? diclofenac sodium (VOLTAREN) 1 % gel Apply 2 g topically four (4) times a day. 100 g 0   ??? emtricitabine-tenofovir, TDF, (TRUVADA) 200-300 mg per tablet Take 1 tablet by mouth daily. 30 tablet 11   ??? EPINEPHrine (EPIPEN) 0.3 mg/0.3 mL injection Inject 0.3 mL (0.3 mg total) under the skin once for 1  dose. 1 each 1   ??? fluticasone propionate (FLONASE) 50 mcg/actuation nasal spray Instill 2 sprays into each nostril two (2) times a day. 16 g 1   ??? gabapentin (NEURONTIN) 250 mg/5 mL oral solution Take 10 mL (500 mg total) by mouth Three (3) times a day. 990 mL 0   ??? inhalational spacing device Spcr Use as directed with albuterol and symbicort 1 each 1   ??? needle, disp, 18 G 18 Quamir x 1 1/2 Ndle For drawing hormone for injection. 25 each 0   ??? needle, disp, 25 Vern 25 Matan x 5/8 Ndle For subcutaneous hormone injection. 25 each 0   ??? nifedipine 0.3% lidocaine 1.5% in petrolatum ointment Apply a pea-size amount to anal area 3 times a day 100 g 0   ??? omalizumab (XOLAIR) 150 mg/mL syringe Inject 2 mL (300 mg total) under the skin every twenty-eight (28) days. 2 mL 12   ??? oxyCODONE-acetaminophen (PERCOCET) 5-325 mg per tablet Take 1 tablet by mouth two (2) times a day as needed for pain. 60 tablet 0   ??? plecanatide (TRULANCE) 3 mg Tab Take one tablet by mouth every day 30 tablet 2   ??? syringe, disposable, 1 mL Syrg For subcutaneous hormone injection. 25 each 0   ??? syringe, disposable, 2.5 mL Syrg Use weekly 30 Syringe 2   ??? testosterone cypionate (DEPOTESTOTERONE CYPIONATE) 200 mg/mL injection Inject 0.2 mL (40 mg total) under the skin every seven (7) days. 2 mL 0   ??? triamcinolone (KENALOG) 0.1 % cream        Current Facility-Administered Medications   Medication Dose Route Frequency Provider Last Rate Last Admin   ??? dexamethasone (DECADRON) 4 mg/mL injection 4 mg  4 mg Intra-articular Once Parke Poisson, MD       ??? omalizumab [name]) injection 300 mg  300 mg Subcutaneous Q28 Days [name], MD   300 mg at 06/30/21 0947   ??? ropivacaine (NAROPIN) 5 mg/mL (0.5 %) injection 6 mL  6 mL Intra-articular Once Parke Poisson, MD       ??? triamcinolone acetonide (KENALOG-40) injection 40 mg  40 mg Intra-articular Once Parke Poisson, MD           Ozell lives with his girlfriend in [place]. He works at [employer] but is currently on medical leave.    Objective:     Laboratory testing reviewed and pertinent for the following:  Component      Latest Ref Rng & Units 07/16/2018 08/15/2018   Cocklebur IgE      <0.35 kUA/L <0.35    Cat dander IgE      <0.35 kUA/L <0.35    [place] (White Poplar) Tree IgE      <0.35 kUA/L <0.35    Dog Dander IgE      <0.35 kUA/L <0.35    D. farinae IgE      <0.35 kUA/L 0.66 (H)    D. pteronyssinus IgE      <0.35 kUA/L 0.55 (H)    Bahia Grass IgE      <0.35 kUA/L <0.35    Johnson Grass IgE      <0.35 kUA/L <0.35    Timothy Grass IgE      <0.35 kUA/L <0.35    Alternaria alternata IgE      <0.35 kUA/L 0.39 (H)    Candida Albicans IgE      <0.35 kUA/L 4.49 (H)    Cladosporium Herbarum IgE      <  0.35 kUA/L <0.35    Epicoccum purpurascens IgE      <0.35 kUA/L <0.35    Fusarium Proliferatum IgE      <0.35 kUA/L <0.35    Aspergillus fumigatus IgE      <0.35 kUA/L <0.35    Mucor Racemosus IgE      <0.35 kUA/L <0.35    Aspergillus [place] IgE      <0.35 kUA/L <0.35    Mouse IgE      <0.35 kUA/L <0.35    P chrysogenum (P notatum) IgE      <0.35 kUA/L <0.35    Rhizopus Nigrans      <0.35 kUA/L <0.35    Trichophyton rubrum IgE <0.35 kUA/L <0.35    Setomelanomma rostrata (H. halodes) IgE      <0.35 kUA/L <0.35    Mugwort IgE      <0.35 kUA/L <0.35    Pigweed, Common IgE      <0.35 kUA/L <0.35    English Plantain IgE      <0.35 kUA/L <0.35    Giant Ragweed IgE      <0.35 kUA/L <0.35    German Cockroach IgE      <0.35 kUA/L <0.35    Sheep Sorrel      <0.35 kUA/L <0.35    Ragweed, short (common) IgE      <0.35 kUA/L <0.35    White Ash Tree IgE      <0.35 kUA/L <0.35    Beech (American) tree IgE      <0.35 kUA/L <0.35    Birch (Common Silver) tree IgE      <0.35 kUA/L <0.35    Box Elder Tree IgE      <0.35 kUA/L <0.35    Elm Tree IgE      <0.35 kUA/L <0.35    Maple Leaf Sycamore      <0.35 kUA/L <0.35    White Oak Tree IgE      <0.35 kUA/L <0.35    Pecan Hickory IgE      <0.35 kUA/L <0.35    Walnut Tree IgE      <0.35 kUA/L <0.35    [place] Grass IgE      <0.35 kUA/L <0.35    Goosefoot (Lamb's Quarters) IgE      <0.35 kUA/L <0.35    Willow tree IgE      <0.35 kUA/L <0.35    WBC      4.5 - 11.0 10*9/L 5.1 7.7   RBC      4.00 - 5.20 10*12/L 5.74 (H) 5.98 (H)   HGB      12.0 - 16.0 g/dL 16.1 09.6   HCT      04.5 - 46.0 % 47.1 (H) 48.9 (H)   MCV      80.0 - 100.0 fL 82.1 81.8   MCH      26.0 - 34.0 pg 25.8 (L) 25.8 (L)   MCHC      31.0 - 37.0 g/dL 40.9 81.1   RDW      91.4 - 15.0 % 13.9 14.0   MPV      7.0 - 10.0 fL 6.4 (L) 7.9   Platelet      150 - 440 10*9/L 345 383   Neutrophils %      % 69.2 74.5   Lymphocytes %      % 22.3 18.6   Monocytes %      % 4.8 4.6   Eosinophils %      %  1.3 0.2   Basophils %      % 0.7 0.3   Absolute Neutrophils      2.0 - 7.5 10*9/L 3.6 5.7   Absolute Lymphocytes      1.5 - 5.0 10*9/L 1.1 (L) 1.4 (L)   Absolute Monocytes       0.2 - 0.8 10*9/L 0.2 0.4   Absolute Eosinophils      0.0 - 0.4 10*9/L 0.1 0.0   Absolute Basophils       0.0 - 0.1 10*9/L 0.0 0.0   Large Unstained Cells      0 - 4 % 2 2   Hypochromasia      Not Present  Slight (A)   CD3% (T Cells)      61 - 86 %OfLymphs 74    Absolute CD3 Count 915-3,400 Cells/uL 889 (L)    CD4% (T Helper)      34 - 58 %OfLymphs 44    Absolute CD4 Count      510-2,320 Cells/uL 529    CD8% T Suppressor      12 - 38 %OfLymphs 27    Absolute CD8 Count      180-1,520 Cells/uL 325    CD4:CD8 Ratio      0.9 - 4.8 1.6    CD19% (B Cells)      7 - 23 %OfLymphs 11    Absolute CD19 Count      105 - 920 Cells/uL 132    CD16/56% NK Cell      1 - 27 %OfLymphs 13    Absolute CD16/56      15-1,080 Cells/uL 156    Diphtheria IgG Av Value      IU/mL 0.95    Tetanus IgG Value      IU/mL >2.24    Diphtheria IgG Ab       Positive    Tetanus IgG Ab       Positive    Sed Rate      0 - 20 mm/h 4 1   IgM      35 - 290 mg/dL 161    Total IgG      096-0,454 mg/dL 0,981    IgE, Total      2-214 IU/mL IU/mL 420 (H)    IgA      40.0 - 400.0 mg/dL 191.4    CRP      <78.2 mg/L  <5.0

## 2021-07-14 DIAGNOSIS — F411 Generalized anxiety disorder: Principal | ICD-10-CM

## 2021-07-14 DIAGNOSIS — G894 Chronic pain syndrome: Principal | ICD-10-CM

## 2021-07-14 DIAGNOSIS — F902 Attention-deficit hyperactivity disorder, combined type: Principal | ICD-10-CM

## 2021-07-14 MED ORDER — OXYCODONE-ACETAMINOPHEN 5 MG-325 MG TABLET
ORAL_TABLET | Freq: Two times a day (BID) | ORAL | 0 refills | 30 days | PRN
Start: 2021-07-14 — End: 2021-08-13

## 2021-07-14 MED ORDER — CLONAZEPAM 0.5 MG TABLET
ORAL_TABLET | Freq: Every day | ORAL | 0 refills | 30 days | PRN
Start: 2021-07-14 — End: 2021-08-13

## 2021-07-14 MED ORDER — DEXTROAMPHETAMINE-AMPHETAMINE 20 MG TABLET
ORAL_TABLET | Freq: Two times a day (BID) | ORAL | 0 refills | 30 days
Start: 2021-07-14 — End: 2021-08-13

## 2021-07-14 MED ORDER — GABAPENTIN 250 MG/5 ML ORAL SOLUTION
Freq: Three times a day (TID) | ORAL | 0 refills | 33 days | Status: CP
Start: 2021-07-14 — End: 2021-08-16

## 2021-07-14 NOTE — Unmapped (Signed)
[facility]  OUTPATIENT PHYSICAL THERAPY  07/14/2021  Note Type: Treatment Note       Patient Name: Vincent Waters  Date of Birth:Jan 21, 1981  Diagnosis:   Encounter Diagnoses   Name Primary?   ??? Hip pain, bilateral Yes   ??? Chronic bilateral low back pain with left-sided sciatica    ??? Weakness of both lower extremities    ??? Decreased mobility      Referring MD:  Parke Poisson, MD      Date of Onset of Impairment-12/28/2020  Date PT Care Plan Established or Reviewed-06/30/2021  Date PT Treatment Started-06/30/2021   Plan of Care Effective Date:     Assessment/Plan:    Assessment details:       Vincent Waters is a 41 y.o. adult who presents with bilateral hip and back pain.  Signs and symptoms consistent with low back pain with positive response to extension based exercise. Reports the exercises have been helping.  Expresses some inconsistencies in ability vs subjective report.  Will continue with extension based exercise program. Shortened session due to paitient fatigue    Visit #: 2    Patient and therapist wore their mask throughout session.          Impairments: decreased range of motion, decreased strength, impaired balance, impaired flexibility, joint restriction, pain, trigger points, decreased endurance, decreased mobility, gait deviation and poor awareness of body mechanics      Personal Factors/Comorbidities: 3    Specific Comorbidities: percocet use, return to work soon, gender status, latex allergy    Examination of Body Systems: 1-2 elements    Body System: MSK, neuromuscular    Clinical Presentation: unstable    Clinical Decision Making: moderate    Prognosis: good prognosis    Positive Prognosis Rationale: motivated for treatment and response to trial tx.  Negative Prognosis Rationale: Pain Status.    Therapy Goals      Goals:      In 8 weeks:  1. Pt will increase FOTO score to 44 demonstrating a significant improvement in function and perception of symptoms since beginning PT.  3. Pt will be able to sit  for 30 minutes with 0/10 pain and without need for cueing and/or adaptive strategies.  4. Return to work in 2 months without need for light duty.    Plan    Therapy options: will be seen for skilled physical therapy services    Planned therapy interventions: Balance Training, Civil engineer, contracting, Education - Geophysicist/field seismologist, Education - Patient, Scientist, product/process development, Investment banker, operational, Functional Mobility, Home Exercise Program, Therapeutic Activities, Therapeutic Exercises, Teacher, early years/pre, Neuromuscular Re-education, Manual Therapy and Dry Needling      Frequency: 1x week    Duration in weeks: 8    Education provided to: patient.    Education provided: Surveyor, minerals, HEP, Back care, Body awareness, Posture, Self-soft tissue mobilization, Symptom management and Treatment options and plan    Education results: needs reinforcement and verbalized good understanding.    Communication/Consultation: Discussed POC with PT.      Total Session Time: 35    Treatment rendered today:      ______________________________________________________________________  PT eval: 20 minutes    Manual Therapy: 15 minutes  prone lumbar PAs      Therapeutic Exercise: 15 minutes  prone press up  hip extension in standing  hand heel rocks  LTR on ball  monster walks  lateral band walks  figure 4 bridge  sit  to stand with band  ______________________________________________________________________    Plan details: Education provided today as part of PT session included HEP, review of PT POC, and anticipated progression with PT services. HEP provided and reviewed.         Subjective:   History of Present Condition      History of Present Condition/Chief Complaint:       Vincent Waters is a 41 y.o. adult who presents to PT with c/o hip pain. Describes symptom onset approximately 1 year ago.  Symptoms aggravated by laying on the hips and improve with injections but only help for 1-2 months. Pain radiates to front shin and ankle    Subjective:     Exercises are going well  Quality of life: good    Pain  Current pain rating: 10  At worst pain rating: 10  Quality: aching and stabbing  Relieving factors: standing  Aggravating factors: sitting (pressure)  Pain Related Behaviors: none  Progression: no change  Red flags: none.    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     Tests     Functional Assessment   Focus on Therapeutic Outcomes   FOTO Score (out of 100): 19  Additional Tests Details:   RFIS: increased pain/peripheralization  REIS: centralization                                  I attest that I have reviewed the above information.  Signed: Marta Lamas, PT  07/14/2021 9:10 AM

## 2021-07-15 ENCOUNTER — Ambulatory Visit
Admit: 2021-07-15 | Discharge: 2021-07-16 | Payer: PRIVATE HEALTH INSURANCE | Attending: Family Medicine | Primary: Family Medicine

## 2021-07-15 MED ORDER — DEXTROAMPHETAMINE-AMPHETAMINE 20 MG TABLET
ORAL_TABLET | Freq: Two times a day (BID) | ORAL | 0 refills | 30 days | Status: CP
Start: 2021-07-15 — End: 2021-08-14

## 2021-07-15 MED ORDER — CLONAZEPAM 0.5 MG TABLET
ORAL_TABLET | Freq: Every day | ORAL | 0 refills | 30 days | Status: CP | PRN
Start: 2021-07-15 — End: 2021-08-14
  Filled 2021-07-16: qty 30, 30d supply, fill #0

## 2021-07-15 MED ORDER — OXYCODONE-ACETAMINOPHEN 5 MG-325 MG TABLET
ORAL_TABLET | Freq: Two times a day (BID) | ORAL | 0 refills | 30 days | Status: CP | PRN
Start: 2021-07-15 — End: 2021-08-14
  Filled 2021-07-16: qty 60, 30d supply, fill #0

## 2021-07-15 NOTE — Unmapped (Signed)
[facility]       Patient Name:Ivon Dover Waters  MRN: [id number]  DOB: [birth year]  Age: 41 y.o.   Date: 07/15/2021    SUBJECTIVE     Chief complaint: Shoulder Pain (Right. ) and Back Pain (Lumbar aspect. )      History of present Illness:  Vincent Waters is a 41 y.o. adult with  has a past medical history of Anxiety, Autoimmune autonomic neuropathy, Breast cyst, Migraines, and Scleroderma (CMS-HCC). who comes in today in with complaint of Shoulder Pain (Right. ) and Back Pain (Lumbar aspect. )  .    Shoulder Pain  Chronic R shoulder pain x several years, prev seen at [facility], had repeat GH inj for possible frozen shoulder and did PT with some improvement, saw Vincent Waters in ortho April 2021 and felt to possibly be TOS vs cervical radic, referred for emg but never called and never did. States did consider RC surgery in [place] with Vincent Waters at [facility] but chose not 2/2 down time.  More recently was moving furniture and hurt his shoulder again with lifting an ottoman.  Pain extends from neck to posterior shoulder down to deltoid and into hand. Gets tingling into 4/5th fingers on R.    OBJECTIVE   Ht 162.6 cm (5' 4)  - Wt 70.8 kg (156 lb)  - LMP 07/20/2017 (Exact Date)  - BMI 26.78 kg/m??   General/Constitutional: NAD    Comprehensive Right Shoulder Exam:  Inspection:   Ecchymosis: not done   Scapular winging: positive   Posture: slumped  forward head  rounded shoulders  TTP:    globally  ROM: difficult with guarding   F flexion: 160   Abduction: 160   ER: Normal   IR: T9  Strength:   SSP: 5/5   ER: 5/5  IR: 5/5  Special Tests:   Painful Arc: negative   Hawkin's: negative   Neer's: mild  Empty can: negative  Subscap lift off: negative  External Lag: negative  Internal Lag: negative  Drop arm: negative  O'brien's: equivocal  AC crossover: negative  Resisted AC cross adduction: negative  Apprehension: negative  Jobe Relocation: not done  Posterior Apprehension: not done  Load and Shift: Anterior not done, Posterior not done    Diagnostic Imaging     Right Shoulder x-rays: N/A    Right Shoulder MRI 2021: IMPRESSION:  Noncontrast MRI of right shoulder -  Mild tendinosis of the supraspinatus tendon with more focal intrasubstance signal within the bursal surface of the midsubstance tendon which may represent more focal tendinosis versus low-grade partial-thickness tear.    2 prior right shoulder MRIs done at [facility] similar findings with mild tendinosis.    MR c spine: No significant spinal canal or neuroforaminal narrowing    ASSESSMENT AND PLAN     Pt is a 41 y.o. year old adult adult with:     Diagnosis ICD-10-CM Associated Orders   1. Chronic right shoulder pain  M25.511     G89.29       2. Cervical radiculopathy  M54.12 Electrodiagnostics      3. Thoracic outlet syndrome  G54.0 Electrodiagnostics     Ambulatory referral to Vascular Surgery      4. Scapular dysfunction  M89.9           Majority of pain does not appear to be frozen shoulder anymore.  Does have significant guarding and scapular dyskinesis which could be contributing to organic shoulder pain.  However he continues to have pain from the right sided neck and trap region that radiates down in his fourth and fifth fingers concerning for thoracic outlet.    1. Home exercise program: no  2. PT referral: Continue with PT and really focus on scapular function  3. OTC meds: no  4. Prescription medication: no  5. Brace/Orthotic: no  6. Injection: no  7. Imaging: no  8. Labs: no  9. Specialty referral: yes and Set up for NCS/EMG and vascular surgery clinic eval thoracic outlet syndrome as he has failed extensive treatment for organic shoulder pathology  10. OA supplement handout: no      - Return for EMG results.    Corbin Ade, MD, CAQSM  Primary Care Sports Medicine  [facility]

## 2021-07-15 NOTE — Unmapped (Signed)
Thank you for coming to [facility] clinic today.    We aim to provide you with the highest quality care.  If you have any unanswered questions after the visit or need to schedule a follow up visit, please do not hesitate to call us:    [facility] [place]:  [phone]    [facility] [place]:  [phone]    You can also use MyChart to reach out to [place].    We look forward to seeing you again in the future and appreciate you coming to [facility].    Thank you.    Corbin Ade, MD     You have been referred to get nerve studies to further evaluate your symptoms and pain.  If you do not hear from the nerve study lab in 1 week, please call below number to get scheduled.  Once you get your studies done, please notify our clinic at [phone] for [facility] or [phone] for Kendell Bane to get scheduled for a follow-up appointment to review results.    EMG/nerve study lab: [phone]

## 2021-07-20 NOTE — Unmapped (Signed)
Patient Advice Request     Patient Name: Vincent Waters  Caller: Self (Patient)  Contact Method: MyChart  Reason for Call:    dextroamphetamine-amphetamine (ADDERALL) 20 mg tablet [[id number]]  [facility] is out of this medication, requesting to send another script to the [facility].    Address & Phone #: [address], [place] [address]  [facility]. [phone]    Previously Discussed: no  Appointment Offered: No

## 2021-07-22 ENCOUNTER — Ambulatory Visit: Admit: 2021-07-22 | Discharge: 2021-07-23 | Payer: PRIVATE HEALTH INSURANCE

## 2021-07-22 DIAGNOSIS — L508 Other urticaria: Principal | ICD-10-CM

## 2021-07-22 DIAGNOSIS — J309 Allergic rhinitis, unspecified: Principal | ICD-10-CM

## 2021-07-22 LAB — TOXICOLOGY SCREEN, URINE
AMPHETAMINE SCREEN URINE: NEGATIVE
BARBITURATE SCREEN URINE: NEGATIVE
BENZODIAZEPINE SCREEN, URINE: NEGATIVE
BUPRENORPHINE, URINE SCREEN: NEGATIVE
CANNABINOID SCREEN URINE: NEGATIVE
COCAINE(METAB.)SCREEN, URINE: NEGATIVE
FENTANYL SCREEN, URINE: NEGATIVE
METHADONE SCREEN, URINE: NEGATIVE
OPIATE SCREEN URINE: NEGATIVE
OXYCODONE SCREEN URINE: POSITIVE — AB

## 2021-07-22 LAB — TESTOSTERONE: TESTOSTERONE TOTAL: 400 ng/dL — ABNORMAL HIGH

## 2021-07-22 MED ORDER — TRIAMCINOLONE ACETONIDE 0.1 % TOPICAL CREAM
Freq: Two times a day (BID) | TOPICAL | 0 refills | 0 days | Status: CP
Start: 2021-07-22 — End: ?
  Filled 2021-07-23: qty 15, 7d supply, fill #0

## 2021-07-22 MED ORDER — GABAPENTIN 250 MG/5 ML ORAL SOLUTION
Freq: Three times a day (TID) | ORAL | 0 refills | 33.00000 days | Status: CP
Start: 2021-07-22 — End: 2021-07-22
  Filled 2021-07-23: qty 990, 33d supply, fill #0

## 2021-07-22 MED ORDER — DEXTROAMPHETAMINE-AMPHETAMINE 20 MG TABLET
ORAL_TABLET | Freq: Two times a day (BID) | ORAL | 0 refills | 30.00000 days | Status: CP
Start: 2021-07-22 — End: 2021-08-21
  Filled 2021-07-22: qty 60, 30d supply, fill #0

## 2021-07-22 MED FILL — XOLAIR 150 MG/ML SUBCUTANEOUS SYRINGE: SUBCUTANEOUS | 28 days supply | Qty: 2 | Fill #2

## 2021-07-22 NOTE — Unmapped (Signed)
[facility]  Established Patient Clinic Note    Assessment/Plan:   Vincent Waters is a 41 y.o.adult who presents for follow-up:    Problem List Items Addressed This Visit        Other    Attention deficit hyperactivity disorder (ADHD), combined type    Relevant Medications    dextroamphetamine-amphetamine (ADDERALL) 20 mg tablet    Male-to-male transgender person - Primary    Relevant Orders    Testosterone    Chronic pain syndrome    Relevant Medications    gabapentin (NEURONTIN) 250 mg/5 mL oral solution    Other Relevant Orders    Toxicology Screen, Urine     Attending: Dr. Shayne Waters    # GAD  Feeling much improved now that he's no longer living with his girlfriend, and acknowledges that her anxiety was likely working in a cycle with his own.  Social anxiety is no longer preventing him from leaving his house on a regular basis.  Has met with Dr. Matilde Waters and will continue doing this.   - Continue working Dr. Matilde Waters    # Attention deficit hyperactivity disorder (ADHD), combined type  Requests adderall prescription be re-sent to new pharmacy as current pharmacy is out and cannot refill his prescription. PDMP shows appropriate refills.   - dextroamphetamine-amphetamine (ADDERALL) 20 mg tablet; Take 1 tablet (20 mg total) by mouth two (2) times a day.  Dispense: 60 tablet; Refill: 0    # Chronic pain syndrome  - gabapentin (NEURONTIN) 250 mg/5 mL oral solution; Take 10 mL (500 mg total) by mouth Three (3) times a day.  Dispense: 990 mL; Refill: 0  - Toxicology Screen, Urine    # Gender-affirming care  Pt's preferred name and pronouns are Vincent Waters, he/they. Pt has been doing well on current dose, and is satisfied with changes. Emotional/social changes include decreased anxiety per above. No medical concerns, no problems with injections.   - Current Testosterone dose: 40 mg of 200 mg/mL solution IM every week.   - H/H wnl in Jan, will check T level today  - Injection supplies: 18 Vincent Waters needle to draw, 1.5 mL syringe; 23 Vincent Waters for IM, 25 for SQ.   - PrEP eligibility: taking     # Skin rash  Pruritic, hyperpigmented, slightly raised, macular rash present throughout back, along skin tension lines, with a less frequent scattered pustules. Suggestive of pityrasis rosea, though pustules would be less commonly seen. Additionally, while this current flare has been present for about a month, pt says he has previously had this rash intermittently for the past several years. Will trial triamcinolone, however keeping in mind pt's autoimmune history, and given unusual time course of the rash, recommend biopsy and skin clinic referral for further evaluation. Pt in agreement with plan and has no further questions or concerns.   - Ambulatory referral to [facility]; Future  - triamcinolone (KENALOG) 0.1 % cream; Apply topically Two (2) times a day.  Dispense: 15 g; Refill: 0    # HCM  - COVID bivalent booster at health dept in Oct 2022    HEALTH MAINTENANCE ITEMS STILL DUE:  Health Maintenance Due   Topic Date Due   ??? COVID-19 Vaccine (5 - Booster for Pfizer series) 05/15/2021     Follow-up: No follow-ups on file.    Future Appointments   Date Time Provider Department Center   07/28/2021  8:15 AM Vincent Waters, PT [facility]   08/04/2021  8:15  AM Vincent Waters, PT [facility]   08/10/2021  9:15 AM Vincent Jewels Vincent Haymaker, PsyD [facility]   08/11/2021  8:15 AM Vincent Waters, PT [facility]   08/13/2021  9:00 AM Angeline Slim, MD [facility]   08/18/2021  1:25 PM Kern Reap, MD [facility]   08/24/2021  6:30 PM [facility]EP 8 [id number]       Subjective   Vincent Waters is a 41 y.o. adult  coming to clinic today for the following issues:    Chief Complaint   Patient presents with   ??? Follow-up     HPI: see above for more information.     I have reviewed the problem list, medications, and allergies and have updated/reconciled them if needed.    Vincent Waters reports that he has never smoked. He has never used smokeless tobacco.  Health Maintenance   Topic Date Due   ??? COVID-19 Vaccine (5 - Booster for Pfizer series) 05/15/2021   ??? DTaP/Tdap/Td Vaccines (2 - Td or Tdap) 08/19/2027   ??? Pneumococcal Vaccine 0-64  Completed   ??? Hepatitis C Screen  Completed   ??? Influenza Vaccine  Completed       Objective     VITALS: BP 116/83  - Pulse 87  - Temp 36.2 ??C (97.2 ??F) (Temporal)  - Wt 71.2 kg (157 lb)  - LMP 07/20/2017 (Exact Date)  - BMI 26.95 kg/m??     Physical Exam  General: well-appearing, sitting upright in no acute distress  Head: Normocephalic, atraumatic  ENT: No dental trauma noted.   Eyes: conjunctiva normal, non-erythematous, non-icteric, no discharge.  Neck: no thyroid enlargement or masses  Lungs: No increased work of breathing or audible wheezing  Skin: Hyperpigmented, slightly raised, macular rash present throughout back, along skin tension lines, with a less frequent scattered pustules.   Musculoskeletal: No visible gait abnormalities  Neurologic: Alert & oriented x 3, no gross sensorimotor abnormalities  Psychiatric: Pleasant, cooperative, good eye contact, appropriate thought processes    Wt Readings from Last 3 Encounters:   07/22/21 71.2 kg (157 lb)   07/15/21 70.8 kg (156 lb)   06/24/21 70.8 kg (156 lb)      PHQ-9 PHQ-9 TOTAL SCORE   10/22/2020 6   09/04/2020 3   08/21/2020 5   05/22/2020 9       Multiple values from one day are sorted in reverse-chronological order     LABS/IMAGING  I have reviewed pertinent recent labs and imaging in Epic    Alver Fisher, MD, MPH (he/him)  Resident Physician, PGY-2  Department of Family Medicine       [facility] [employer] [facility]  [address], [place], [place] [address] ??? Telephone [phone] ??? Fax [phone]  [email or web address]

## 2021-07-22 NOTE — Unmapped (Signed)
Hi Vincent Waters,    It was great to see you today. I look forward to seeing you at your next visit. In the meantime, for non-urgent questions, please message our clinic on MyChart. I am typically able to respond to these messages within 3-5 business days. For more urgent matters, please call our clinic at [phone]. In the event of a medical emergency, call 911 and/or visit our urgent care or an emergency room.     Take care,    Claudie Revering, MD, MPH (he/him)  Resident Physician, PGY-2  Department of Family Medicine    To schedule an appointment, please call our clinic at [phone].    For the after-hours nurse line, please call [phone]. If you think you are having an emergency, please call 911 or go to your nearest emergency department.      Please allow 2-5 business days for a response to your MyChart message.

## 2021-07-26 NOTE — Unmapped (Signed)
I was immediately available present on site.  I agree with the assessment and plan as documented in the resident's note. Rishith Siddoway, MD

## 2021-07-27 NOTE — Unmapped (Signed)
Complex Case Management Pre-Assessment Note  Pre-Assessment  NOTE      Summary:  Care Coordinator spoke with patient and verified correct patient using two identifiers today for enrollment in Complex Case Management. Informed patient of Complex Case Management services. Pt has agreed to participate in the Complex Case Management program. Case Manager contact information was provided to patient. Care Coordinator scheduled initial assessment with Case Manager for 08/05/2021 at 3:45 PM     General Case management Questions:     General Care Management - Patient Level    Assessment completed with: patient[VZ1.1]  Patient lives with: roommate or housemate[VZ1.1]  Support system: other family members[VZ1.1]  Type of residence: apartment[VZ1.1]  DME used at home: cane (Comment: cane occassionaly)[VZ1.1]  Transportation means: personal vehicle, family[VZ1.1]  Does your health interfere with activities of daily living?: sleep, exercise, household management, eating, work[VZ1.1]  Exercise: yes[VZ1.1]  Times per week: 1[VZ1.1]  Type of exercise: physical  therapy and exercise[VZ1.1]  Follow special diet?:  (Comment: bland diet no fried foods and lean diet)[VZ1.1]  Interested in seeing dietician?: Yes[VZ1.1]  Experiencing side effects from current medications: No[VZ1.1]  Interested in seeing pharmacist?: Yes[VZ1.1]  Difficulty keeping appointments:  (Comment: only due to work schedule and health)[VZ1.1]  Need assistance with community resources?: Yes[VZ1.1]  Other significant issues impacting care?: Sleep study done and needs to get fitted for CPAP in march   Severe Allergies,migraines   Achilles issues both feet   Bursitis of both hips  Nerve issue in lower back   Right shoulder blade   [VZ1.2]  Do you use any home health services?: PT[VZ1.1]     Attribution     VZ1.1 Alberteen Sam, LPN 16/10/96 04:54    VZ1.2 Alberteen Sam, LPN 09/81/19 14:78           History Review:     Past Medical History:   Diagnosis Date   ??? Anxiety    ??? Asthma    ??? Autoimmune autonomic neuropathy    ??? Breast cyst    ??? Financial difficulties    ??? Migraines    ??? Neuromuscular disorder (CMS-HCC)    ??? Scleroderma (CMS-HCC)    ??? Visual impairment      Caregiver burden No   Cognitive Impairment No   Falls Risk No   Financial difficulty Yes   Frail Elderly No   Hearing impairment/loss No   Homeless No   Impaired mobility No   Inadequate social/family support No   Ineffective family coping No   Low Literacy No   Nonadherence to medication No   Non-english speaking No   Terminal Illness/Hospice No   Transportation barriers No   Visual impairment Yes     Past Surgical History:   Procedure Laterality Date   ??? CHOLECYSTECTOMY     ??? HYSTERECTOMY Bilateral     08/2017   ??? MASTECTOMY      04/2018   ??? OOPHORECTOMY  2005    laparotomy   ??? PR CYSTOURETHROSCOPY N/A 08/30/2017    Procedure: CYSTOURETHROSCOPY (SEPARATE PROCEDURE);  Surgeon: Forbes Cellar, MD;  [facility];  Service: Advanced Laparoscopy   ??? PR EXCISION TURBINATE,SUBMUCOUS Bilateral 01/04/2021    Procedure: SUBMUCOUS RESECTION INFERIOR TURBINATE, PARTIAL OR COMPLETE, ANY METHOD;  Surgeon: Adam Swaziland Kimple, MD;  [facility] OR [facility];  Service: ENT   ??? PR LAPAROSCOPY W TOT HYSTERECTUTERUS <=250 GRAM  W TUBE/OVARY N/A 08/30/2017    Procedure: LAPAROSCOPY, SURGICAL, WITH TOTAL HYSTERECTOMY, FOR UTERUS 250  G OR LESS; W/REMOVAL TUBE(S) AND/OR OVARY(S);  Surgeon: Forbes Cellar, MD;  [facility];  Service: Advanced Laparoscopy   ??? PR MASTECTOMY, SIMPLE, COMPLETE Bilateral 01/09/2020    Procedure: MASTECTOMY, SIMPLE, COMPLETE;  Surgeon: Carola Rhine, MD;  Location: MAIN OR [facility];  Service: Plastics   ??? PR REPAIR OF NASAL SEPTUM N/A 01/04/2021    Procedure: SEPTOPLASTY OR SUBMUCOUS RESECTION, WITH OR WITHOUT CARTILAGE SCORING, CONTOURING OR REPLACEMENT WITH GRAFT;  Surgeon: Adam Swaziland Kimple, MD;  Location: [facility] OR [facility];  Service: ENT   ??? PR UPPER GI ENDOSCOPY,BIOPSY N/A 08/13/2019    Procedure: UGI ENDOSCOPY; WITH BIOPSY, SINGLE OR MULTIPLE;  Surgeon: Alfred Levins, MD;  [facility]: [facility];  Service: Gastroenterology   ??? SINUS SURGERY       Family History   Problem Relation Age of Onset   ??? Breast cancer Sister    ??? Cancer Sister         breast   ??? Ovarian cancer Maternal Grandmother    ??? Breast cancer Maternal Grandmother    ??? Cataracts Maternal Grandmother    ??? Cancer Mother         breast   ??? No Known Problems Father      Counseling given: Not Answered    Counseling given: Not Answered                     Social History     Substance and Sexual Activity   Drug Use Yes   ??? Types: Marijuana    Comment: occasionaly     Social History     Substance and Sexual Activity   Sexual Activity Yes   ??? Partners: Female       Self-Efficacy Score:  SCORE: 4.33 (07/27/2021 11:33 AM)      Medications:   Outpatient Encounter Medications as of 07/27/2021   Medication Sig Dispense Refill   ??? azelastine (ASTELIN) 137 mcg (0.1 %) nasal spray Instill 2 sprays into each nostril two (2) times a day as needed for rhinitis. 30 mL 11   ??? budesonide-formoteroL (SYMBICORT) 160-4.5 mcg/actuation inhaler Inhale 1 puff by mouth Two (2) times a day. 10.2 g 5   ??? cetirizine (ZYRTEC) 10 MG tablet Take 1 tablet (10 mg total) by mouth Two (2) times a day. 30 tablet 12   ??? clindamycin (CLEOCIN T) 1 % lotion      ??? clonazePAM (KLONOPIN) 0.5 MG tablet Take 1 tablet (0.5 mg total) by mouth daily as needed for anxiety. 30 tablet 0   ??? cyclobenzaprine (FLEXERIL) 5 MG tablet Take 1 tablet (5 mg total) by mouth Three (3) times a day as needed for muscle spasms. 9 tablet 0   ??? dextroamphetamine-amphetamine (ADDERALL) 20 mg tablet Take 1 tablet (20 mg total) by mouth two (2) times a day. 60 tablet 0   ??? diclofenac sodium (VOLTAREN) 1 % gel Apply 2 g topically four (4) times a day. 100 g 0   ??? emtricitabine-tenofovir, TDF, (TRUVADA) 200-300 mg per tablet Take 1 tablet by mouth daily. 30 tablet 11   ??? EPINEPHrine (EPIPEN) 0.3 mg/0.3 mL injection Inject 0.3 mL (0.3 mg total) under the skin once for 1 dose. 1 each 1   ??? fluticasone propionate (FLONASE) 50 mcg/actuation nasal spray Instill 2 sprays into each nostril two (2) times a day. 16 g 1   ??? gabapentin (NEURONTIN) 250 mg/5 mL oral solution Take 10 mL (  500 mg total) by mouth Three (3) times a day. 990 mL 0   ??? inhalational spacing device Spcr Use as directed with albuterol and symbicort 1 each 1   ??? needle, disp, 18 G 18 Calob x 1 1/2 Ndle For drawing hormone for injection. 25 each 0   ??? needle, disp, 25 Jediah 25 Zyheir x 5/8 Ndle For subcutaneous hormone injection. 25 each 0   ??? nifedipine 0.3% lidocaine 1.5% in petrolatum ointment Apply a pea-size amount to anal area 3 times a day 100 g 0   ??? omalizumab (XOLAIR) 150 mg/mL syringe Inject 2 mL (300 mg total) under the skin every twenty-eight (28) days. 2 mL 12   ??? oxyCODONE-acetaminophen (PERCOCET) 5-325 mg per tablet Take 1 tablet by mouth two (2) times a day as needed for pain. 60 tablet 0   ??? plecanatide (TRULANCE) 3 mg Tab Take one tablet by mouth every day 30 tablet 2   ??? syringe, disposable, 1 mL Syrg For subcutaneous hormone injection. 25 each 0   ??? syringe, disposable, 2.5 mL Syrg Use weekly 30 Syringe 2   ??? testosterone cypionate (DEPOTESTOTERONE CYPIONATE) 200 mg/mL injection Inject 0.2 mL (40 mg total) under the skin every seven (7) days. 2 mL 0   ??? triamcinolone (KENALOG) 0.1 % cream Apply topically Two (2) times a day. 15 g 0     Facility-Administered Encounter Medications as of 07/27/2021   Medication Dose Route Frequency Provider Last Rate Last Admin   ??? dexamethasone (DECADRON) 4 mg/mL injection 4 mg  4 mg Intra-articular Once Parke Poisson, MD       ??? omalizumab [name]) injection 300 mg  300 mg Subcutaneous Q28 Days [name], MD   300 mg at 06/30/21 0947   ??? ropivacaine (NAROPIN) 5 mg/mL (0.5 %) injection 6 mL  6 mL Intra-articular Once Parke Poisson, MD       ??? triamcinolone acetonide (KENALOG-40) injection 40 mg  40 mg Intra-articular Once Shawn Halford Chessman, MD            Social History Review:     Financial Resource Strain: Medium Risk   ??? Difficulty of Paying Living Expenses: Somewhat hard      Food Insecurity: Food Insecurity Present   ??? Worried About Programme researcher, broadcasting/film/video in the Last Year: Often true   ??? Ran Out of Food in the Last Year: Often true      Transportation Needs: No Transportation Needs   ??? Lack of Transportation (Medical): No   ??? Lack of Transportation (Non-Medical): No      Physical Activity: Unknown   ??? Days of Exercise per Week: 1 day   ??? Minutes of Exercise per Session: Not on file      Stress: Stress Concern Present   ??? Feeling of Stress : To some extent      Intimate Partner Violence: Not At Risk   ??? Fear of Current or Ex-Partner: No   ??? Emotionally Abused: No   ??? Physically Abused: No   ??? Sexually Abused: No      Alcohol Use: Not At Risk   ??? How often do you have a drink containing alcohol?: Never   ??? How many drinks containing alcohol do you have on a typical day when you are drinking?: Not on file   ??? How often do you have 5 or more drinks on one occasion?: Never      Tobacco Use: Low Risk    ???  Smoking Tobacco Use: Never   ??? Smokeless Tobacco Use: Never   ??? Passive Exposure: Not on file      Depression: Not at risk   ??? PHQ-2 Score: 0        Upcoming Appointment (s):   Future Appointments   Date Time Provider Department Center   07/28/2021  8:15 AM Marta Lamas, PT [facility]   08/04/2021  8:15 AM Marta Lamas, PT [facility]   08/09/2021  3:15 PM MEDALL NURSE [facility]CALLERGET [facility]   08/10/2021  9:15 AM Neftali Matilde Haymaker, PsyD [facility]   08/11/2021  8:15 AM Marta Lamas, PT [facility]   08/13/2021  9:00 AM Angeline Slim, MD [facility]   08/18/2021  1:25 PM Kern Reap, MD [facility]   08/19/2021  8:50 AM Kern Reap, MD [facility]   08/19/2021 11:30 AM Thora Lance, MD [facility]   08/24/2021  6:30 PM SLEEP 8 [id number]         This patient is currently under review for Complex Case Management services. For progress, care plan changes, updates or recent discharges please contact CM.        Olen Cordial Timara Loma LPN- High Risk Care Coordinator  [facility] Clinical Services  [address], [place] [address]  [phone] ext [phone]

## 2021-07-28 NOTE — Unmapped (Signed)
[facility]  AMBULATORY CARE CENTER  [address]                                 [place], [place] [address]    [phone]    [facility] did not attend their scheduled Physical Therapy follow-up session. Patient did call ahead late to cancel. Notified patient that if this happens again we will remove all future visits from schedule. Please contact me if you have any questions or concerns.    Thank you,     Signed: Marta Lamas, PT  07/28/2021 9:18 AM

## 2021-08-04 NOTE — Unmapped (Signed)
[facility]  AMBULATORY CARE CENTER  [address]                                 [place], [place] [address]    [phone]    This note is to let you know that [facility] did not show for their scheduled Physical Therapy follow-up session.  [facility] has no showed on two occassions and late cancelled 3 additional times and is discharged from Physical Therapy at this time.  Patient was notified last session if they no-show/late cancellend they would be discharged and notified today of d/c. Please contact me if you have any questions or concerns.    Thank you for this referral,     Signed: Marta Lamas, PT  08/04/2021 8:40 AM

## 2021-08-06 NOTE — Unmapped (Signed)
Complex Case Management  SUMMARY NOTE    Attempted to contact pt today at Cell number to complete initial assessment. Left message to return call.; 1st attempt    Discuss at next visit: Complete Initial Assessment

## 2021-08-10 ENCOUNTER — Ambulatory Visit: Admit: 2021-08-10 | Discharge: 2021-08-11 | Payer: PRIVATE HEALTH INSURANCE | Attending: Clinical | Primary: Clinical

## 2021-08-10 ENCOUNTER — Institutional Professional Consult (permissible substitution): Admit: 2021-08-10 | Discharge: 2021-08-11 | Payer: PRIVATE HEALTH INSURANCE

## 2021-08-10 DIAGNOSIS — L501 Idiopathic urticaria: Principal | ICD-10-CM

## 2021-08-10 MED ADMIN — omalizumab (XOLAIR) injection 300 mg: 300 mg | SUBCUTANEOUS | @ 16:00:00 | Stop: 2022-04-05

## 2021-08-11 NOTE — Unmapped (Signed)
[facility] Administered Medication Refill Coordination Note      NAME:[facility] DOB: 1981/01/29      Medication: Xolair    Day Supply: 28 days      SHIPPING      Next delivery from [facility] [phone]) to [facility] for [facility] is scheduled for 03/03.    Clinic contact: [name]    Patient's next nurse visit for administration: n/a.    We will follow up with clinic monthly for standard refill processing and delivery.      Huldah Marin Samella Parr  Specialty Pharmacy Technician

## 2021-08-12 NOTE — Unmapped (Signed)
Pt called for triage, no answer. I have left a voicemail with instructions to call office.

## 2021-08-12 NOTE — Unmapped (Unsigned)
[facility] FOLLOW UP VISIT    Assessment/Plan:     Vincent Waters is a 41 y.o. adult with ***    - [employer]  -     Uses MyChart? ***  No follow-ups on file.    Vincent Sickle, MD  Fellow, PGY-5  [employer] [facility]  Division of Gastroenterology & Hepatology    The patient was seen and discussed with Dr. ***, who agreed with the above assessment and plan.    Subjective      Brief HPI:  Vincent Waters is a 41 y.o. transgender male who is here for follow-up.    Vincent Waters previously followed with Dr. Luan Waters. Last visit was 12/16/20. He has a history of global GI dysmotility manifesting as dysphagia (esophageal and oropharyngeal), gastroparesis, heartburn, and constipation. This is in part related to chronic pain medications. Constipation has led to hemorrhoids and anal fissure.  Notably, he has muscle spasms, fatigue, and limited ROM in multiple joints, white matter changes on brain imaging with reported cognitive changes. He has seen both Neurology and Rheumatology (positive ANA, dry mouth and eyes possibly medication related) for these issues.    Interval History (since last visit on ***): ***    Objective   Physical Exam   Vital Signs: LMP 07/20/2017 (Exact Date)    Constitutional: He is in no apparent distress.   Eyes: Anicteric sclerae.   Cardiovascular: No peripheral edema.   Gastrointestinal: Soft, nontender abdomen without hepatosplenomegaly, hernias or masses.   Neurologic: Nonfocal, no asterixis.   Psychiatric: Alert and oriented to person, place and time. Normal affect.       Labs:    Studies:  EGD 07/2019  Impression:            - Z-line regular, 37 cm from the incisors.                         - 3 cm hiatal hernia.                         - Erythematous mucosa in the antrum.                         - A few fundic gland polyps.                         - Very little gastric motility was noted. Moderate                          amount of retained bilious fluid.                         - Normal examined duodenum.                         - No specimens collected.    NM GES ([facility]) 04/13/18  Solid gastric retention percentages at approximate times with normal ranges:   0 minutes: 100%   60 minutes (normal 37-90%): 85.7%   120 minutes (normal 30%-60%): 74.8%   180 minutes (normal 10%-29%): 50.4%   240 minutes (normal 0%-9%): 22.5%     CONCLUSION:   Abnormal 2-hour gastric retention.   Abnormal 4-hour gastric retention.   Findings consistent with delayed gastric  emptying.      No cross-sectional abdominal imaging.

## 2021-08-12 NOTE — Unmapped (Signed)
Complex Case Management  SUMMARY NOTE    Complex Case Manager spoke with patient and verified correct patient using two identifiers today to introduce the Complex Case Management program.     Discussed the following:  Program Services    Program status: Interested    Discuss at next visit: Complete Initial Assessment    Scheduled for: 08/16/21 at 1pm with Complex Case Manager

## 2021-08-13 DIAGNOSIS — Z789 Other specified health status: Principal | ICD-10-CM

## 2021-08-13 DIAGNOSIS — F902 Attention-deficit hyperactivity disorder, combined type: Principal | ICD-10-CM

## 2021-08-13 DIAGNOSIS — G894 Chronic pain syndrome: Principal | ICD-10-CM

## 2021-08-13 DIAGNOSIS — R21 Rash and other nonspecific skin eruption: Principal | ICD-10-CM

## 2021-08-13 DIAGNOSIS — F411 Generalized anxiety disorder: Principal | ICD-10-CM

## 2021-08-13 DIAGNOSIS — M62838 Other muscle spasm: Principal | ICD-10-CM

## 2021-08-13 MED ORDER — TRIAMCINOLONE ACETONIDE 0.1 % TOPICAL CREAM
Freq: Two times a day (BID) | TOPICAL | 0 refills | 0 days
Start: 2021-08-13 — End: ?

## 2021-08-13 MED ORDER — DEXTROAMPHETAMINE-AMPHETAMINE 20 MG TABLET
ORAL_TABLET | Freq: Two times a day (BID) | ORAL | 0 refills | 30 days | Status: CP
Start: 2021-08-13 — End: 2021-09-12

## 2021-08-13 MED ORDER — OXYCODONE-ACETAMINOPHEN 5 MG-325 MG TABLET
ORAL_TABLET | Freq: Two times a day (BID) | ORAL | 0 refills | 30 days | Status: CP | PRN
Start: 2021-08-13 — End: 2021-09-12
  Filled 2021-08-13: qty 60, 30d supply, fill #0

## 2021-08-13 MED ORDER — CYCLOBENZAPRINE 5 MG TABLET
ORAL_TABLET | Freq: Three times a day (TID) | ORAL | 0 refills | 3 days | PRN
Start: 2021-08-13 — End: ?

## 2021-08-13 MED ORDER — TESTOSTERONE CYPIONATE 200 MG/ML INTRAMUSCULAR OIL
SUBCUTANEOUS | 0 refills | 70 days | Status: CP
Start: 2021-08-13 — End: 2021-10-22
  Filled 2021-08-27: qty 2, 70d supply, fill #0

## 2021-08-13 MED ORDER — CLONAZEPAM 0.5 MG TABLET
ORAL_TABLET | Freq: Every day | ORAL | 0 refills | 30 days | Status: CP | PRN
Start: 2021-08-13 — End: 2021-09-12
  Filled 2021-08-13: qty 30, 30d supply, fill #0

## 2021-08-13 MED FILL — EMTRICITABINE 200 MG-TENOFOVIR DISOPROXIL FUMARATE 300 MG TABLET: ORAL | 30 days supply | Qty: 30 | Fill #1

## 2021-08-14 MED ORDER — CYCLOBENZAPRINE 5 MG TABLET
ORAL_TABLET | Freq: Three times a day (TID) | ORAL | 0 refills | 3 days | Status: CP | PRN
Start: 2021-08-14 — End: ?
  Filled 2021-08-27: qty 9, 3d supply, fill #0

## 2021-08-14 MED ORDER — TRIAMCINOLONE ACETONIDE 0.1 % TOPICAL CREAM
Freq: Two times a day (BID) | TOPICAL | 0 refills | 0 days | Status: CP
Start: 2021-08-14 — End: ?
  Filled 2021-08-27: qty 15, 7d supply, fill #0

## 2021-08-16 NOTE — Unmapped (Addendum)
NEW TOS PATIENT     Patient Name: Vincent Waters DOB:08-Apr-1981  Date of Encounter: 08/19/2021    Assessment:  Patient is a 41 y.o. adult with history of right UE pain and paresthesia likely consistent with neurogenic thoracic outlet syndrome. Greater than 45 minutes was spent with the patient describing the etiology, pathogenesis and options of care for their problem, including a thoracic outlet decompression.     Plan:  - MRI brachial plexus and MRA chest to rule out any other pathologies that may explain his symptoms.  - Staged right scalene and pec minor injections to further confirm diagnosis and set expectations surgical outcomes.  - Trial of TOS exercises. Patient was provided a brochure detailing the exercises.   - Improve ergonomics at home and at work, including acquiring the most correct posture to allow relief of the symptoms.   - RTC in 5 weeks for re-evaluation, 1 week after last imaging     HPI: Vincent Waters is a 41 y.o. 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 Kern Reap, MD for the evaluation of right UE pain. 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 For 4 years, he has had limited shoulder mobility, right neck pain and stiffness, shoulder pain radiating down the arm, and tingling and cramping in the fingers in an ulnar distribution. Patient is not currently working.     He was previously seen at [facility] and had glenohumeral injections and PT for possible frozen shoulder with some improvement. Patient saw Dr. Clydene Pugh in Ortho in 09/2019 who felt sx were possibly 2/2 TOS vs. cervical radiculopathy. EMG was ordered at that time but never obtained. Patient has also considered rotator cuff surgery with a provider in [place]. Previous shoulder MRIs have found mild tendinosis of supraspinatus tendon. Patient has tried PT and dry needling without relief. He is on Percocet 5-325 mg BID PRN.     Past Medical History:   Past Medical History:   Diagnosis Date    Anxiety     Asthma     Autoimmune autonomic neuropathy     Breast cyst     Financial difficulties     Migraines     Neuromuscular disorder (CMS-HCC)     Scleroderma (CMS-HCC)     Visual impairment        Past Surgical History:  Past Surgical History:   Procedure Laterality Date    CHOLECYSTECTOMY      HYSTERECTOMY Bilateral     08/2017    MASTECTOMY      04/2018    OOPHORECTOMY  2005    laparotomy    PR CYSTOURETHROSCOPY N/A 08/30/2017    Procedure: CYSTOURETHROSCOPY (SEPARATE PROCEDURE);  Surgeon: Forbes Cellar, MD;  [facility]: [facility];  Service: Advanced Laparoscopy    PR EXCISION TURBINATE,SUBMUCOUS Bilateral 01/04/2021    Procedure: SUBMUCOUS RESECTION INFERIOR TURBINATE, PARTIAL OR COMPLETE, ANY METHOD;  Surgeon: Adam Swaziland Kimple, MD;  Location: [facility];  Service: ENT    PR LAPAROSCOPY W TOT HYSTERECTUTERUS <=250 GRAM  W TUBE/OVARY N/A 08/30/2017    Procedure: LAPAROSCOPY, SURGICAL, WITH TOTAL HYSTERECTOMY, FOR UTERUS 250 G OR LESS; W/REMOVAL TUBE(S) AND/OR OVARY(S);  Surgeon: Forbes Cellar, MD;  [facility];  Service: Advanced Laparoscopy    PR MASTECTOMY, SIMPLE, COMPLETE Bilateral 01/09/2020    Procedure: MASTECTOMY, SIMPLE, COMPLETE;  Surgeon: Carola Rhine, MD;  Location: MAIN OR [place];  Service: Plastics    PR REPAIR OF NASAL SEPTUM N/A 01/04/2021    Procedure: SEPTOPLASTY OR SUBMUCOUS RESECTION, WITH OR WITHOUT CARTILAGE SCORING, CONTOURING OR REPLACEMENT WITH GRAFT;  Surgeon: Adam Swaziland Kimple, MD;  Location: [facility];  Service: ENT    PR UPPER GI ENDOSCOPY,BIOPSY N/A 08/13/2019    Procedure: UGI ENDOSCOPY; WITH BIOPSY, SINGLE OR MULTIPLE;  Surgeon: Alfred Levins, MD;  Location: [facility] PROCEDURES [facility];  Service: Gastroenterology    SINUS SURGERY         Social History:  Patient  reports that he has never smoked. He has never used smokeless tobacco. He reports that he does not currently use alcohol. He reports current drug use. Drug: Marijuana.    ROS:  Balance of systems reviewed x 10 and negative except as noted in HPI.      DASH Score  Date   84.09 08/19/2021     Physical Examination:   Vitals:    08/19/21 1122   BP: 117/76   Pulse: 84   Temp: 36.7 ??C (98.1 ??F)     General: Well appearing, no acute distress  Psych: Awake, Alert , Oriented by 3. Affect is appropriate.  Neck: Supple, no evidence of JVD, no evidence of thyromegaly, no cervical bruits  Cardiac: RRR  Lungs: Easy work of breathing, CTA Bilaterally  Upper extremities: Warm and well perfused. Radial artery pulses bilaterally 2+. Limited RUE ROM and shoulder mobility.   Muscle Strength:   Left Right    Deltoid  5 /5 5/5   Biceps  5/5 5/5   Wrist Extensor (Radial Nerve) 5/5 5/5   Finger Flexion (C8) 5/5 5/5   Finger Abduction (T1; Ulnar Nerve) - -   Thumb Opposition (C8-T1; Median Nerve) 5/5 5/5   Pronator Drift - -       Scalene Tenderness: R+ L-  Pec Minor Tenderness:  R+ L-  Roos Test: R+ L-  ULT: R+ L-     60 minutes spent face-to-face counseling and coordination of care and greater than 50% of the visit was spent face-to-face    ______________________________________________________________________    Documentation assistance was provided by Azalee Course, Scribe, on August 19, 2021 at 11:21 AM for Doroteo Glassman, Juel Burrow, and Thora Lance, MD    ----------------------------------------------------------------------------------------------------------------------  August 23, 2021 1:46 PM. Documentation assistance provided by the Scribe. I was present during the time the encounter was recorded. The information recorded by the Scribe was done at my direction and has been reviewed and validated by me.  ----------------------------------------------------------------------------------------------------------------------

## 2021-08-19 ENCOUNTER — Ambulatory Visit: Admit: 2021-08-19 | Discharge: 2021-08-19 | Payer: PRIVATE HEALTH INSURANCE

## 2021-08-19 ENCOUNTER — Ambulatory Visit
Admit: 2021-08-19 | Discharge: 2021-08-19 | Payer: PRIVATE HEALTH INSURANCE | Attending: Specialist | Primary: Specialist

## 2021-08-19 DIAGNOSIS — G540 Brachial plexus disorders: Principal | ICD-10-CM

## 2021-08-19 DIAGNOSIS — F902 Attention-deficit hyperactivity disorder, combined type: Principal | ICD-10-CM

## 2021-08-19 DIAGNOSIS — R21 Rash and other nonspecific skin eruption: Principal | ICD-10-CM

## 2021-08-19 DIAGNOSIS — K219 Gastro-esophageal reflux disease without esophagitis: Principal | ICD-10-CM

## 2021-08-19 LAB — TOXICOLOGY SCREEN, URINE
AMPHETAMINE SCREEN URINE: NEGATIVE
BARBITURATE SCREEN URINE: NEGATIVE
BENZODIAZEPINE SCREEN, URINE: NEGATIVE
BUPRENORPHINE, URINE SCREEN: NEGATIVE
CANNABINOID SCREEN URINE: NEGATIVE
COCAINE(METAB.)SCREEN, URINE: NEGATIVE
FENTANYL SCREEN, URINE: NEGATIVE
METHADONE SCREEN, URINE: NEGATIVE
OPIATE SCREEN URINE: NEGATIVE
OXYCODONE SCREEN URINE: NEGATIVE

## 2021-08-19 MED ORDER — PANTOPRAZOLE 20 MG TABLET,DELAYED RELEASE
ORAL_TABLET | Freq: Every day | ORAL | 1 refills | 30 days | Status: CP
Start: 2021-08-19 — End: 2022-08-19
  Filled 2021-08-23: qty 30, 30d supply, fill #0

## 2021-08-19 MED ORDER — DEXTROAMPHETAMINE-AMPHETAMINE 20 MG TABLET
ORAL_TABLET | Freq: Two times a day (BID) | ORAL | 0 refills | 30 days | Status: CP
Start: 2021-08-19 — End: 2021-09-18
  Filled 2021-08-19: qty 60, 30d supply, fill #0

## 2021-08-19 NOTE — Unmapped (Signed)
[facility]  Established Patient Clinic Note    Assessment/Plan:   Vincent Waters is a 41 y.o.adult who presents for follow-up:     Problem List Items Addressed This Visit        Digestive    Gastroesophageal reflux disease    Relevant Medications    pantoprazole (PROTONIX) 20 MG tablet       Other    Attention deficit hyperactivity disorder (ADHD), combined type    Relevant Medications    dextroamphetamine-amphetamine (ADDERALL) 20 mg tablet    Other Relevant Orders    Toxicology Screen, Urine (Completed)   Other Visit Diagnoses     Rash and nonspecific skin eruption    -  Primary    Relevant Orders    Dermatopathology Order        Attending: Dr. Onesti Bonfiglio Stain    # Rash   Hyperpigmented papular rash throughout back, bilateral, with a few scattered pustules, stable from prior. No purulence, crusting, erythema or warmth to suggest superinfection. Minimal improvement of rash on topical steroids. Shingles less likely given bilateral presence; may represent folliculitis, though pt reports minimal response to topical antibiotics previously. Given patient's autoimmune history and time course of the rash (fluctuating presence over the past several years, with most recent flareup lasting at least the past 1-2 months) with absence of obvious environmental triggers, discussed risks versus benefits of pursuing a punch biopsy today; patient verbalized understanding and would like to pursue punch biopsy.  Please see procedure note below.  - Punch biopsy    # GERD  Pt would like to switch back to protonix, says it worked better previously, hasn't been able to reach GI. Encouraged pt to call GI clinic and schedule follow-up appointment, in the meantime will restart protonix given persistent GERD symptoms.  - pantoprazole (PROTONIX) 20 MG tablet; Take 1 tablet (20 mg total) by mouth daily.  Dispense: 30 tablet; Refill: 1  - Encouraged pt to set up GI appt    # Attention deficit hyperactivity disorder (ADHD), combined type  PDMP shows appropriate refills.   - Toxicology Screen, Urine  - dextroamphetamine-amphetamine (ADDERALL) 20 mg tablet; Take 1 tablet (20 mg total) by mouth two (2) times a day.  Dispense: 60 tablet; Refill: 0    HEALTH MAINTENANCE ITEMS STILL DUE:  Health Maintenance Due   Topic Date Due   ??? COVID-19 Vaccine (5 - Booster for Pfizer series) 05/15/2021     Follow-up: No follow-ups on file.    Future Appointments   Date Time Provider Department Center   08/24/2021  9:30 AM Mena Goes, PsyD [facility]   08/24/2021  6:30 PM SLEEP 8 [id number]   09/03/2021  8:00 AM HBR MRI MBL [facility] [place] - HBR   09/21/2021  9:30 AM Neftali Matilde Haymaker, PsyD [facility]   09/23/2021 11:30 AM Thora Lance, MD [facility]       Subjective   Vincent Waters is a 41 y.o. adult  coming to clinic today for the following issues:    Chief Complaint   Patient presents with   ??? Rash     Rash on back, ointment did not work , possible biopsy       HPI: see above for more information.     Answers for HPI/ROS submitted by the patient on 08/18/2021  Chronicity: chronic  Onset: more than 1 year ago  Progression since onset: waxing and waning  Affected locations: back, left buttock, right  buttock  Characteristics: pain, redness, itchiness  Exposed to: nothing  anorexia: Yes  congestion: No  cough: No  diarrhea: Yes  eye pain: No  facial edema: No  fatigue: Yes  fever: No  joint pain: No  nail changes: No  rhinorrhea: No  shortness of breath: No  sore throat: No  vomiting: No    I have reviewed the problem list, medications, and allergies and have updated/reconciled them if needed.    Vincent Waters  reports that he has never smoked. He has never used smokeless tobacco.  Health Maintenance   Topic Date Due   ??? COVID-19 Vaccine (5 - Booster for Pfizer series) 05/15/2021   ??? DTaP/Tdap/Td Vaccines (2 - Td or Tdap) 08/19/2027   ??? Pneumococcal Vaccine 0-64  Completed   ??? Hepatitis C Screen  Completed   ??? Influenza Vaccine Completed       Objective     VITALS: BP 116/78 (BP Site: L Arm, BP Position: Sitting, BP Cuff Size: Medium)  - Pulse 83  - Temp 36.3 ??C (97.4 ??F) (Temporal)  - Ht 162.6 cm (5' 4)  - Wt 70.9 kg (156 lb 6.4 oz)  - LMP 07/20/2017 (Exact Date)  - BMI 26.85 kg/m??     Physical Exam  General: well-appearing, sitting upright in no acute distress  Head: Normocephalic, atraumatic  ENT: No dental trauma noted.   Eyes: conjunctiva normal, non-erythematous, non-icteric, no discharge.  Neck: no thyroid enlargement or masses  Lungs: No increased work of breathing or audible wheezing  Skin: Warm, dry. Hyperpigmented papular rash throughout back, bilateral, with a few scattered pustules, stable from prior. No purulence, crusting, erythema or warmth to suggest superinfection.   Musculoskeletal: No visible gait abnormalities  Neurologic: Alert & oriented x 3, no gross sensorimotor abnormalities  Psychiatric: Pleasant, cooperative, good eye contact, appropriate thought processes    Wt Readings from Last 3 Encounters:   08/19/21 70.8 kg (156 lb)   08/19/21 70.9 kg (156 lb 6.4 oz)   07/22/21 71.2 kg (157 lb)      PHQ-9 PHQ-9 TOTAL SCORE   10/22/2020 6   09/04/2020 3   08/21/2020 5   05/22/2020 9       Multiple values from one day are sorted in reverse-chronological order     LABS/IMAGING  I have reviewed pertinent recent labs and imaging in Epic     ________    Punch Biopsy Procedure Note    Indication(s): Skin neoplasm of uncertain behavior    Primary/Referring Provider:  Kern Reap, MD     Resident Physician:  Alver Fisher, MD  Attending Physician:  Simone Curia, MD    Pre-operative Diagnosis: Rash of uncertain etiology  Post-operative Diagnosis: same    Location(s): R mid-back    Allergies:  reviewed allergy section in the chart    Consent:    I reviewed the risks (bleeding, infection, scarring, poor cosmetic result), benefits (accurate diagnosis of lesion, removal of lesion), and alternatives (observation) to proposed procedure.  I answered all of the patient's questions and addressed all concerns.  Patient agreed to proceed.  Verbal and written consent obtained; form scanned to chart.      Time-out:  Performed immediately prior to procedure      Anesthesia: Lidocaine 1% with epinephrine (1:100,000) with sodium bicarbonate 8.4% (3:1 ratio)    Procedure Details:   The lesion and surrounding area were prepped with chlorhexidine.  Using G#27 1-1/2 needle, 1 mL of 1% lidocaine w/epi was  injected to dermal area causing lesion to elevate slightly.  Skin on both sides of biopsy site stretched away from the site, perpendicular to the [employer].  4 mm area of lesion sharply excised (up to the subcutaneous tissue) using 4 mm punch instrument, pickups, and iris scissors. Steri strips were used for skin closure.  Sterile dressing was then applied.  The patient tolerated the procedure well.      Specimen(s): The specimen was sent for pathologic examination.      EBL: <87mL    Condition: Stable    Complications:  None    Post-operative Instructions and Plan:  ?? Patient was instructed to keep the wound dry for 24 hours.  ?? If bleeding occurs, apply pressure x 15-60mins or compression (eg, Ace wraps), elevate, &/or apply ice.  If bleeding persists, call clinic for further instructions.  ?? Apply petrolatum twice daily to keep wound moist.  ?? Keep wound covered with dressing, esp. at night to avoid drying.  ?? Call or RTC if signs of wound infection ensue (pain, purulent drainage, significant crusting, advancing redness, increasing warmth).  ?? Recommended that the patient use ice, OTC acetaminophen and OTC ibuprofen as needed for pain.   ?? I will notify patient of biopsy results (electronically or by mail or phone) and any further recommendations (if any) based on pathology findings.    Alver Fisher, MD, MPH (he/him)  Resident Physician, PGY-2  Department of Family Medicine      [facility] [place] [facility]  [address], [place], [place] [address] ??? Telephone [phone] ??? Fax [phone]  [email or web address]

## 2021-08-19 NOTE — Unmapped (Signed)
Hi Vincent Waters,    It was great to see you today. I look forward to seeing you at your next visit. In the meantime, for non-urgent questions, please message our clinic on MyChart. I am typically able to respond to these messages within 3-5 business days. For more urgent matters, please call our clinic at [phone]. In the event of a medical emergency, call 911 and/or visit our urgent care or an emergency room.     Take care,    Claudie Revering, MD, MPH (he/him)  Resident Physician, PGY-2  Department of Family Medicine    To schedule an appointment, please call our clinic at [phone].    For the after-hours nurse line, please call [phone]. If you think you are having an emergency, please call 911 or go to your nearest emergency department.      Please allow 2-5 business days for a response to your MyChart message.

## 2021-08-19 NOTE — Unmapped (Signed)
scheduled MRI

## 2021-08-19 NOTE — Unmapped (Signed)
We are sending a referral to the pain clinic with Dr. Oda Kilts, I will reach out to him personally regarding blocks.    You do not need to contact his office to schedule. They will contact you it may be up to 7 business days.     We are ordering a MRI of the brachial plexus and MRA of the chest. These can be performed at the [facility] which is one mile from our clinic. ALL IMAGING MUST BE PERFORMED AT [facility] IN [place], NO EXCEPTIONS.  IMAGING MAY NOT BE PERFORMED AT [place]. You will need all imaging at least 7 days prior to your next appointment with Dr. Gayla Doss.  This will give time to review all the imaging prior to your visit.     You will need to see Dr. Gayla Doss one week after the last block.  We perform staged blocks which means we will perform the scalene block one day and you will get the pectoralis minor block on a different day.     If your appointment with Dr. Oda Kilts is after our next scheduled appointment, you can call the nursing line and we will change the appointment with Dr. Gayla Doss.  We must have both blocks performed before we meet again.     Nursing line [phone] So you have been referred to Dr. Oda Kilts for an injection (or injections)....what happens now?    -First off, I look forward to taking care of you.  I am one of our anesthesiology pain providers and have been the main pain provider helping the vascular surgeons for the last few years.  I have helped out with more injections than I can keep track of.  You are in great hands with our surgical colleagues and we look forward to being part of your care.    -Once the vascular surgery folks put in the referral to me, they send me a message about the specific injection or injections that they would like help with (typically an anterior scalene muscle injection and a pectoralis minor muscle injection, or just one of those).  I will place the orders to be scheduled at either the [facility] or our Pain clinic at the [facility] and they will contact you with available dates.  If we are doing both injections, they will go ahead and schedule both on different dates.  If your vascular surgery follow up is sooner than we will be done with the injections, that's OK!  Just let the vascular surgery clinic know and they can move back your appointment to follow up with them.    The first visit:  Your first visit will be our new patient consult AND an injection as well (typically the anterior scalene, unless you were only referred for a pectoralis minor injection).  The office will give you new patient paperwork to fill out while you are waiting or for after we are done with the injection (so we make sure we know about you and your pain/symptoms as well as possible). I usually work with an Anesthesiology Pain Fellow (a physician who is doing a year of specialty training in pain management), so they are usually the first person you will meet.  Do not worry, they know more about TOS than many pain providers that have been practicing for years (and I have personally reviewed your chart/case in depth as well).  The fellow will go over your history, do a physical exam, and then go over the injection  and the risks and benefits of doing it.  Once we are done evaluating you and all of your questions have been answered, we will bring you back to the procedure room to start the injection.    Again, typically we start with an anterior scalene injection on the first visit and at a later date do the pectoralis minor injection (some are referred for only an anterior scalene or some patients are referred for just the pectoralis minor injection, please see the descriptions below).    The majority of the time my fellows will be performing the injection (they have done plenty of them). But I am there the entire time making sure it goes exactly how I want it to go.  Sometimes in July and August I am the one performing the injection myself as I am teaching them how to do it.  No matter what you are in great hands.    Anterior scalene injection:  -We will sometimes have you change into a gown  -You will be brought to the procedure room and placed laying on your back  -Our nurse will start to get you hooked up to a few monitors so we can watch your vitals  -We will clean off your neck so our work area is sterile  -We will put on some drapes to keep our work area clean/sterile  -At that point it typically takes [place] a few minutes as we are setting everything up  -We will first take a look with the ultrasound so we best know your anatomy  -Then we will pick 2 locations to do the injection  -We use such a small needle that we do not need to do any numbing for the skin  -You will feel a pinch, then a burning feeling from the local anesthetic.  -We repeat that at one other location  -The medicine takes around 15-30 minutes to kick in  -It will typically last around 4-6 hours but it could be shorter or longer than that  -The goal is just to relax the muscle to give the nerves more room, to see if your symptoms improve during that time  -Once it wears off, things will go back to your normal  -It is a very low risk injection, but we will talk about the other risks/benefits prior to doing the injection  -We will watch you for a few minutes afterwards, give you some instructions, then you are good for discharge  -Some patients can just monitor their pain/symptoms, some need to go perform the activity that brings it on (if its not present all of the time)      Pectoralis Minor injection:  -We typically have you change into a gown  -You will be brought to the procedure room and placed laying on your back  -Our nurse will start to get you hooked up to a few monitors so we can watch your vitals  -We will clean off your upper chest so our work area is sterile  -We will put on some drapes to keep our work area clean/sterile  -At that point it typically takes [place] a few minutes as we are setting everything up  -We will first take a look with the ultrasound so we best know your anatomy  -Then we will pick 2 locations to do the injection  -Because the pec minor is under the pec major muscle, we do some skin numbing first with a very small needle (bee sting stick and a  burn)  -You will feel a pinch, then a burning feeling from the local anesthetic.  -You may feel touch, pressure, or some minor discomfort as we do the injection into the muscle.  Sometimes burning as the medicine goes in.  -We repeat that at one other location  -The medicine takes around 15-30 minutes to kick in  -It will typically last around 4-6 hours but it could be shorter or longer than that  -The goal is just to relax the muscle to give the nerves more room, to see if your symptoms improve during that time  -Once it wears off, things will go back to your normal  -It is a very low risk injection, but we will talk about the other risks/benefits prior to doing the injection  -We will watch you for a few minutes afterwards, give you some instructions, then you are good for discharge  -Some patients can just monitor their pain/symptoms, some need to go perform the activity that brings it on (if its not present all of the time)    -Once your injection (or injections) are complete, we have you follow up with the vascular surgery clinic to review the results and determine a plan moving forward.  Typically we do not schedule a follow up in our clinic, but occasionally the vascular surgery folks will have you follow up if surgery is not the plan (or you are trying to delay surgery).  We do help out occasionally after surgery if pain/symptom control remains an issue (luckily only a small percentage of patients).    I find this website to be very helpful to learn about TOS.  [email or web address]    If you have any questions, feel free to reach out before your appointment, otherwise we can discuss at your first visit.  I look forward to meeting you!    Vincent Rosales, MD  Assistant Professor of Anesthesiology  Pain Division  [facility]  [place], [place]

## 2021-08-19 NOTE — Unmapped (Signed)
Asked to consider a right staged anterior scalene and pectoralis minor muscle injections to aid in diagnosis of possible Thoracic outlet syndrome.  Chart reviewed.  Have ordered.  Please review the vascular surgery note for treatments tried and failed, previous work up, and physical exam.      Please give patient new patient pain intake form upon checking in.    Ultrasound is required for this procedure in order to be as specific as possible due to performing diagnostic injections into a very specific muscle.  To do that safely and accurately and mimic what a surgical resection would do, ultrasound is required.  Without ultrasound the incorrect muscle could be injected or near the brachial plexus causing a nerve block instead of a diagnostic muscle injection.   For the first injection (anterior scalene, if applicable), it decreases risk of injection into the brachial plexus, internal jugular, or carotid artery.  For the second injection (the pectoralis minor, if applicable), this is a muscle deep over the chest wall, ultrasound decreases the risk of pneumothorax.    Orders Placed This Encounter   Procedures   ??? Trigger Point Inj(s) 1 or 2 Muscles     Vincent Waters  [facility]  First available after scalene block  right pec minor muscle injection under ultrasound     Standing Status:   Future     Standing Expiration Date:   08/20/2022   ??? [place] Needle Guidance Biopsy     Vincent Waters  [facility]  First available after scalene block  right pec minor muscle injection under ultrasound     Standing Status:   Future     Standing Expiration Date:   08/20/2022   ??? Trigger Point Inj(s) 1 or 2 Muscles     Vincent Waters  [facility]  Next Available  right anterior scalene muscle injection under ultrasound  New patient paperwork  New visit and procedure     Standing Status:   Future     Standing Expiration Date:   08/20/2022   ??? [place] Needle Guidance Biopsy     Vincent Waters  [facility]  Next Available  right anterior scalene muscle injection under ultrasound  New patient paperwork  New visit and procedure     Standing Status:   Future     Standing Expiration Date:   08/20/2022

## 2021-08-20 DIAGNOSIS — G894 Chronic pain syndrome: Principal | ICD-10-CM

## 2021-08-20 MED ORDER — GABAPENTIN 250 MG/5 ML ORAL SOLUTION
Freq: Three times a day (TID) | ORAL | 0 refills | 33 days
Start: 2021-08-20 — End: 2021-09-22

## 2021-08-20 MED FILL — XOLAIR 150 MG/ML SUBCUTANEOUS SYRINGE: SUBCUTANEOUS | 28 days supply | Qty: 2 | Fill #3

## 2021-08-22 MED ORDER — GABAPENTIN 250 MG/5 ML ORAL SOLUTION
Freq: Three times a day (TID) | ORAL | 0 refills | 33 days | Status: CP
Start: 2021-08-22 — End: 2021-09-24
  Filled 2021-08-27: qty 990, 33d supply, fill #0

## 2021-08-22 NOTE — Unmapped (Signed)
Addended by: Marylyn Ishihara on: 08/22/2021 08:25 AM     Modules accepted: Orders

## 2021-08-24 ENCOUNTER — Ambulatory Visit: Admit: 2021-08-24 | Discharge: 2021-08-26 | Payer: PRIVATE HEALTH INSURANCE

## 2021-08-24 ENCOUNTER — Ambulatory Visit: Admit: 2021-08-24 | Discharge: 2021-08-26 | Payer: PRIVATE HEALTH INSURANCE | Attending: Clinical | Primary: Clinical

## 2021-08-24 LAB — OPIATE, URINE, QUANTITATIVE
6-MONOACETYLMRPH: <5 ng/mL (ref <5)
BUPRENORPHINE: <5 ng/mL (ref <5)
CODEINE GC/MS CONF: <25 ng/mL (ref <25)
FENTANYL, URINE GC/MS: <0.5 ng/mL (ref <0.5)
HYDROCODONE GC/MS CONF: <25 ng/mL (ref <25)
HYDROMORPHONE GC/MS CONF: <25 ng/mL (ref <25)
MORPHINE GC/MS CONF: <25 ng/mL (ref <25)
NORBUPRENORPHINE: <5 ng/mL (ref <5)
NORFENTANYL, UR GC/MS: <1 ng/mL (ref <1)
OPIATE INTERP: POSITIVE
OXYCODONE (GC/MS): 44 ng/mL — ABNORMAL HIGH (ref <25)
OXYMORPHONE: 50 ng/mL — ABNORMAL HIGH (ref <25)

## 2021-08-25 LAB — AMPHETAM,UR,CONF
AMPHET INTERP: NEGATIVE
AMPHETAMINE GC/MS CONF: NEGATIVE ng/mL
MDA: NEGATIVE ng/mL
MDMA GC/MS CONF: NEGATIVE ng/mL
METHAMPHETAMINE GC/MS URINE: NEGATIVE ng/mL
PHENTERMINE: NEGATIVE ng/mL
PSEUDOEPHEDRINE: NEGATIVE ng/mL

## 2021-08-27 MED FILL — TRULANCE 3 MG TABLET: ORAL | 30 days supply | Qty: 30 | Fill #1

## 2021-08-31 NOTE — Unmapped (Signed)
COMPLEX CASE MANAGEMENT   FOLLOW UP NOTE  Summary:  Complex Case Manager  spoke with patient and verified correct patient using two identifiers today for Complex Case Management follow up. Patient currently resides at Home. Primary concern is GI issues.      Subjective:    Patient/caregiver reported that he continues to have some GI issues. Patient states that this was due to him eating something he shouldn't have. Patient reports that he is still interested in speaking with RD. Appointment was scheduled with patient. Patient denied having any additional needs or concerns at this time.     Objective:     Screenings Completed during visit: None completed at this visit    Barriers to care: Health Literacy and Difficulty Accessing Healthcare    Interventions provided: Supportive Listening     Progress towards Health Literacy goal : On Track -meeting tasks.    Plan:     1. Case Management to: Follow up in 2 weeks    2. Patient/caregiver to: Contact PCP/CM for any questions or concerns.     Discuss at next outreach: Medication Management    Care Coordination Note updated in [facility]: Yes    Future Appointments   Date Time Provider Department Center   09/03/2021  8:00 AM HBR MRI MBL [facility] [place] - [facility]   09/06/2021  9:15 AM MEDALL NURSE UNCALLERGET [facility]   09/08/2021  9:00 AM Omar Person, MD ANESPAINMRKT [facility]   09/10/2021  8:15 AM Omar Person, MD [facility]   09/10/2021  8:15 AM IC PROCEDURE RM 1 [id number] [place] - IC   09/15/2021  9:00 AM Alani Forestine Na, RD/LDN [place] PHA [facility]   09/21/2021  9:30 AM Neftali Matilde Haymaker, PsyD [facility]   09/23/2021 11:30 AM Thora Lance, MD [facility]   10/22/2021  8:00 AM Kern Reap, MD [facility]   11/23/2021  9:30 AM Neftali Matilde Haymaker, PsyD [facility]       Problem List        Frozen shoulder     Severe allergic reaction     Breast cancer screening, high risk patient     Constipation     Dermographia     Acid reflux Mild intermittent asthma     Nondiabetic gastroparesis     Nasal inflammation due to allergen     Migraines     IRON DEFICIENCY ANEMIA     BRCA2 positive     CHRONIC PAIN SYNDROME     Folliculitis     Eye pain     Vitamin D deficiency     BREATHING DIFFICULTY     Hemorrhoids     Encounter for therapeutic drug monitoring     Elevated red blood cell count     Chronic inflammation of the nose     Sinus infection     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    Shellfish Containing Products Anaphylaxis    Latex, Natural Rubber Rash       Medications:  Prior to Admission medications    Medication Dose, Route, Frequency   azelastine (ASTELIN) 137 mcg (0.1 %) nasal spray Instill 2 sprays into each nostril two (2) times a day as needed for rhinitis.   budesonide-formoteroL (SYMBICORT) 160-4.5 mcg/actuation inhaler Inhale 1 puff by mouth Two (2) times a day.   cetirizine (ZYRTEC) 10 MG tablet 10 mg, Oral, 2 times a day (standard)   clindamycin (CLEOCIN T) 1 % lotion No dose, route, or frequency recorded.   clonazePAM (KLONOPIN) 0.5 MG tablet 0.5 mg, Oral, Daily PRN   cyclobenzaprine (FLEXERIL) 5 MG tablet Take 1 tablet (5 mg total) by mouth three (3) times a day as needed for muscle spasms.   dextroamphetamine-amphetamine (ADDERALL) 20 mg tablet 20 mg, Oral, 2 times a day   diclofenac sodium (VOLTAREN) 1 % gel 2 g, Topical, 4 times a day   emtricitabine-tenofovir, TDF, (TRUVADA) 200-300 mg per tablet 1 tablet, Oral, Daily (standard)   EPINEPHrine (EPIPEN) 0.3 mg/0.3 mL injection 0.3 mg, Subcutaneous, Once   fluticasone propionate (FLONASE) 50 mcg/actuation nasal spray Instill 2 sprays into each nostril two (2) times a day.   gabapentin (NEURONTIN) 250 mg/5 mL oral solution 500 mg, Oral, 3 times a day (standard)   inhalational spacing device Spcr Use as directed with albuterol and symbicort   needle, disp, 18 G 18 Priest x 1 1/2 Ndle For drawing hormone for injection.   needle, disp, 25 Agustus 25 Khyran x 5/8 Ndle For subcutaneous hormone injection.   nifedipine 0.3% lidocaine 1.5% in petrolatum ointment Apply a pea-size amount to anal area 3 times a day   omalizumab (XOLAIR) 150 mg/mL syringe 300 mg, Subcutaneous, Every 28 days   oxyCODONE-acetaminophen (PERCOCET) 5-325 mg per tablet 1 tablet, Oral, 2 times a day PRN   pantoprazole (PROTONIX) 20 MG tablet 20 mg, Oral, Daily (standard)   plecanatide (TRULANCE) 3 mg Tab Take one tablet by mouth every day   syringe, disposable, 1 mL Syrg For subcutaneous hormone injection.   syringe, disposable, 2.5 mL Syrg Use weekly   testosterone cypionate (DEPOTESTOTERONE CYPIONATE) 200 mg/mL injection 40 mg, Subcutaneous, Every 7 days   triamcinolone (KENALOG) 0.1 % cream Topical, 2 times a day (standard)          Lesley Atkin S Tora Prunty, LCSW  08/31/2021

## 2021-08-31 NOTE — Unmapped (Signed)
[facility] of Medications Program (CAMP)  Referral RECRUITMENT SUMMARY NOTE     Patient was outreached for CAMP Services. Patient requests call back.      Jerolyn Center, CPhT  Certified Pharmacy Technician   [place] [facility]ations Program (CAMP)   P [phone], F [phone]     BP Referral/Polypharmacy/side effects (GI issues)/Katrina S Beamon   Which medications?
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
## 2021-08-31 NOTE — Unmapped (Signed)
[employer] Worker, Rosine Door, attempted 1st patient contact by [employer] for [employer] (CHW) Program - no answer, left message. Next attempt in 1-2 business days, if no further patient contact.    Vincent Waters  August 31, 2021 3:59 PM

## 2021-09-01 NOTE — Unmapped (Signed)
[employer] Worker, Rosine Door, attempted 2nd patient contact by [employer] for [employer] (CHW) Program - no answer, left message. Next attempt in 1-2 business days, if no further patient contact.    Vincent Waters  September 01, 2021 1:13 PM

## 2021-09-01 NOTE — Unmapped (Signed)
[name]  Referral RECRUITMENT SUMMARY NOTE     Patient was outreached for CAMP Services. Unable to contact- left message.    BP Referral/Polypharmacy/side effects (GI issues)/Vincent Waters *ask which med(s)*    Vincent Waters  Vincent Waters, Scientist, research (medical) of Medications Program (CAMP)   P [phone], F [phone]

## 2021-09-02 NOTE — Unmapped (Signed)
[employer] Worker, Rosine Door, attempted 3rd patient contact by [employer] (CHW) Program - no answer, left message. No further patient contact.   If pt contact CHW resources will be provided      Berline Lopes [facility]  September 02, 2021 2:32 PM

## 2021-09-03 ENCOUNTER — Ambulatory Visit: Admit: 2021-09-03 | Discharge: 2021-09-04 | Payer: PRIVATE HEALTH INSURANCE

## 2021-09-03 MED ADMIN — gadobenate dimeglumine (MULTIHANCE) 529 mg/mL (0.1mmol/0.2mL) solution 14.16 mL: .1 mmol/kg | INTRAVENOUS | @ 13:00:00 | Stop: 2021-09-03

## 2021-09-07 NOTE — Unmapped (Signed)
Called  and left voicemail:  Appt date: 09/08/2021  Appt time: 0900 advised to arrive 30 mins prior to appt.  Physician: Dr. Oda Kilts   Location: [address]]  Clinic phone number: [phone]  Driver? No  Advised to call clinic if experiencing COVID symptoms, signs of infection or recent use of antibiotics or steroids.

## 2021-09-08 ENCOUNTER — Ambulatory Visit
Admit: 2021-09-08 | Discharge: 2021-09-08 | Payer: PRIVATE HEALTH INSURANCE | Attending: Anesthesiology | Primary: Anesthesiology

## 2021-09-08 DIAGNOSIS — G540 Brachial plexus disorders: Principal | ICD-10-CM

## 2021-09-08 MED ADMIN — BUPivacaine HCl (MARCAINE) 0.25 % (2.5 mg/mL) injection 3.75 mg: 1.5 mL | @ 13:00:00 | Stop: 2021-09-08

## 2021-09-08 NOTE — Unmapped (Signed)
-It was good to meet you    -Great information on Thoracic Outlet Sndrome  [email or web address]    -As we discussed, you were sent to [place] for evaluation to undergo a diagnostic injection for your possible thoracic outlet syndrome.    -We discussed the risks and benefits of doing the procedure.    -Today we did a anterior scalene muscle injection    -The injection you had today will likely only last a few hours.  It is very important to keep track of your chronic arm or neck symptoms and see if they are improved during this time.    -You may have eye droop, facial weakness, neck weakness, or numbness/tingling/weakness in your arm from the injection today.  This will likely only last as long as the local anesthetic works (a few hours).  Be cautious.    -If you have improvement in your baseline pain/symptoms, this means you may respond well to surgery.    -If you do not have improvement, it may mean you do not have thoracic outlet syndrome, or sometimes means we need to try a different injection.    -You are already scheduled for your pec minor muscle injection, so we will see you then!    -Please follow up with your surgeon with the results of this injection.  Typically they want to see you once we have completed the injection/injections. They will help determine if surgery is an option moving forward.  If they think you would benefit from a repeat injection to verify, or to undergo a different injection, please contact us and we can set that up.    -Once your injection/injections are complete, will we have you follow up as needed, but please do not hesitate to let us know if we can assist in the future.    MyChart Messages:  Please use MyChart for non-urgent symptoms or matters such as general questions,  non-urgent prescription refills, or non-urgent scheduling issues. For your safety and best  care please DO NOT use MyChart messages to report emergent or urgent symptoms as  messages are only checked during regular business hours.     Thank you for choosing [facility]. It was a pleasure to see you in clinic today. Please contact us with any questions or concerns at [phone].     Criss Rosales, MD    09/08/2021  Anesthesiologist and Pain Management Provider  [facility]          POST PROCEDURE INSTRUCTIONS   You may apply an ice pack 20-30 minutes at a time to the injection site if you experience soreness.     Keep the injection site clean and dry. You make remove the band-aid one day following the procedure.     You may take a shower but AVOID getting in to baths, pools or whirlpools for 48 HOURS AFTER THE PROCEDURE.       ACTIVITY   Refrain from heavy activity for the next 24 to 48 hours. General walking is okay. You may resume your normal activities the day following the procedure.   You may start or resume your individualized exercise program or physical therapy 48 hours after the procedure.   MEDICATIONS   Please note that it is okay to continue other prescribed medications (blood pressure, insulin, water pill, depression/anxiety pill, etc.) as well as other prescribed pain medications such as Neurontin, Lyrical, Celebrex, Ultram, Vicodin, Norco and acetaminophen (Tylenol).   SIDE EFFECTS   Increase in pain during  the first 24 to 48 hours.     You might experience:   1. Mild to moderate swelling at the joint.   2. Possible bruising at the injection site.     WHEN TO CALL THE DOCTOR/NURSE   Severe pain, worse or different that the pain you had before the procedure.     Fever or chills.     Redness, or swelling around the injection site.     Call the Pain Management  Procedural nurses [phone],  during normal business hours (8:00 am - 4:30 pm). If it is AFTER HOURS or during a weekend or holiday, call the hospital operator and ask for the Anesthesia Pain physician on call at [phone].   FOR EMERGENCIES, CALL 911 OR GO TO THE NEAREST HOSPITAL EMERGENCY DEPARTMENT.   ?   TO SCHEDULE APPOINTMENTS OR FOR QUESTIONS RELATED TO MEDICATIONS   Call the Pain Management Clinic at [phone]

## 2021-09-08 NOTE — Unmapped (Signed)
PROCEDURE;  Right  anterior scalene muscle injection under ultrasound guidance.  PRE-PROCEDURE DIAGNOSIS: Thoracic outlet syndrome  POST-PROCEDURE DIAGNOSIS: Same     PERFORMED BY: Dr. Criss Rosales  ASSISTANT: None  Anesthesia: Local     INTERIM HISTORY:      The patient is a 41 y.o. 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  They present today for a diagnostic anterior scalene muscle injection.    Ultrasound is required for this procedure in order to be as specific as possible.  We are performing diagnostic injections into a very specific muscle.  In order to do that safely and accurately, in order to mimic what a surgical resection would do, we must use ultrasound.  If we didn't do ultrasound we could inject the incorrect muscle or could inject into the nerve plexus causing a nerve block instead of a diagnostic muscle injection.  For the anterior scalene, it decreases risk of injection into the brachial plexus, internal jugular vein, and carotid artery.       DESCRIPTION OF THE PROCEDURE:   The above procedure was decided to be performed and discussed with the patient.  Informed consent was obtained and potential risks discussed including, but not limited to: bleeding, bruising, severe allergic reaction to components of the injection materials, infection, nerve damage, temporary brachial plexus block, and the possibility of no benefit (pain relief) derived from the injection, or in rare occasions worsening of pain. Questions were answered to the patient's satisfaction and the patient wishes to proceed.  Alternative options for treatment have previously been discussed and explored with the patient. The patient is not taking antiplatelet or anticoagulation medications and does not have a driver today.     The patient was taken to the procedure room and placed supine . The procedure was performed under ultrasound guidance. The patient's heart rate, blood pressure,and oxygen saturation were monitored throughout the procedure. A timeout was performed and the correct side confirmed with the patient.      The area of interest was sterilely prepped with chlorhexidinex2 and sterilely draped. The physician wore a hat, mask and sterile gloves.     The patients anatomy was first scanned with the ultrasound.  Then a 27 Roberth 0.5 inch needle was inserted into the Right anterior scalene muscle using ultrasound guidance for visualization. Negative aspiration was confirmed. Contrast was not used. Then 0.64ml of 0.25% bupivacaine was injected at TWO locations within muscle for a total of 1.11ml and the needle was removed intact     The patient did tolerate the procedure well. All injection sites were sterilely dressed. The patient was discharged after an appropriate period of observation and post procedural education was given.     Pre-procedure pain score was 10/10  Post-procedure pain score was 6/10     DISPOSITION:   Pt will f/u for pec minor injection

## 2021-09-08 NOTE — Unmapped (Signed)
AVS reviewed and patient verbalized understanding.

## 2021-09-08 NOTE — Unmapped (Signed)
09/08/21 1106   Pre-op Phone Call   Surgery Time Verified No   Expected Arrival Time 0745   Arrival Time Verified No   Surgery Location Verified No   Ride and Caregiver Arranged No     Pre-procedure call. Left message regarding appointment time, arrival time, and need for a driver. Advised patient to call [phone] if experiencing Covid symptoms, tested positive for Covid in the past 10 days, or taking antibiotics for an infection.

## 2021-09-08 NOTE — Unmapped (Unsigned)
Department of Anesthesiology  [facility]  [address]  [place], [place] [address]  [phone]      Date: September 08, 2021  Patient Name: Vincent Waters  MRN: [id number]  PCP: Kern Reap  Referring Provider: Omar Person, MD    Assessment:     Vincent Waters is a 41 y.o. adult. [facility] of possible thoracic outlet syndrome.  They have been evaluated by Dr. Gayla Doss and referred for consideration of diagnostic staged anterior scalene muscle and pectoralis minor muscle injection.  Based on the patients description and exam, it does seem that they possibly have thoracic outlet syndrome.  They have undergone physical therapy or home exercise program without improvement.  There are no coagulation concerns or other contraindication to undergoing the injection today.  We had a good risk and benefit discussion regarding the procedure and they deny any further questions.  We discussed that this is purely a diagnostic injection and they should expect typically a few hours of relief at most, but that it would possibly assist in surgical planning.     The patient has multiple generators of pain but presents today a part of their TOS workup.  They are on opioids at baseline.    Current Pain Provider: no  Urine toxicology: 08/19/21  Urine toxicology screen was appropriate.   Last Opioid Change: None recent  Last EKG: N/A  Previous Compliance Issues: None  Naloxone ordered: Consider since they are on both a benzo and an opioid, althoguh MME is low  Total morphine equivalents: 15  Benzodiazepine: Yes.  Pain Psychology: Consider referral in the future  NCCSRS database was reviewed 09/08/21.      1. 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 TOS (thoracic outlet syndrome)          Plan:         -The patient was referred for Right staged anterior scalene muscle and pectoralis minor muscle injection under ultrasound guidance.   -Today they will undergo a Right anterior scalene muscle injection.  Please see separate note  -They will follow up for a Right pectoralis minor muscle injection  -Follow up with Dr. Gayla Doss with results once injection/injections complete  -We will have them follow up with [place] as needed once diagnostic injections are complete.  -Discussed not taking flexeril if they do not find it effective  -Per MRI report, consider dedicated MRI of shoulder      Subjective:     HPI:  Vincent Waters is seen in consultation at the request of Dr. Gayla Doss for evaluation and recommendations regarding His pain.     Going on since 2017  Was going to sports medicine at [facility].  Treating for adhesive capsulitis.  Slight rotator cuff tear.  Discussed surgery, but wasn't sure if it would be helpful.  Was getting a lot of shoulder injections.  Helped, but had to be repeated fairly frequently.  Right shoulder, arm  Tingling and numbness in right hand.  Feels stiff and swollen (but knows its not swollen)  From neck to shoulder is stiff  Difficulty right should ROM  PCP said go to the gym.  So did go to the gym for a time, but this made it worse.  Has done PT and dry needling in the past.  Says they have done it 3 times. More recently suggested to  do PT, but didn't want to do it again.    Better-rest, avoiding certain activities  Bursitis of hips.  Low back pain.    Pain Clinic? 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 Nothing    Please circle the location of your pain.        Please select the words that describe your pain. Aching     Sharp     shooting     Stabbing Tender Throbbing    Please rate your pain at its WORST in the past month. 10    Please rate your pain at its LEAST in the past month. 8    Please rate your pain on AVERAGE in the past month. 10    How did your pain start? Over time    Do you have any of the following? Numbness     Weakness     Tingling     Change in skin sensitivity     Stiffness    Have you had any loss of bowel of bladder control? No    What worsens your problem? Exercise    How often do you have pain? All the time    What helps your pain? Nothing    Have you tried any of the following for your pain? Physical Therapy     Medication     Other alternative therapies    Have you had any of the following tests for your current problem within the past 7 years? MRI     X-Rays     CT Scan     Nerve Conduction Study    Will your visit involve Workman's Compensation? No    If this is the result of an accident/injury, are you involved in a lawsuit or have a lawyer? No    Please select the phrase(s) which best reflects your treatment goals. Ability to return to my previous daily routine and activity    Is there anything else we should know about your pain, goals, or your feelings about pain management? None    What medications have you tried in the past for your pain? Percocets    Please list all current medications you are taking:       General: Snoring     Night Sweats     Difficulty Sleeping    Head, Ears, Eyes, Nose and Throat: Nasal Congestion/Discharge     Problems Swallowing     Earaches    Skin: Rash     Hives     Itching    Pulmonary - (Lungs): none    Endocrine (Hormonal System): Increased Sweating     Excess Thirst     Hot Flashes     Change in hair or skin texture    Gastrointestinal - (Intestinal): Diarrhea     Constipation     Heartburn  Gastric Reflux    Genitourinary - (Kidneys and Bladder): none    Musculoskeletal System - (Muscles, Joints and Coverings): Joint Aches/Swelling     Spasms/Spasticity/Cramps     Back pain     Muscle Aches/Weakness    Cardiovascular: none    Neurologic: Headaches/Migraines    Psychiatric: Anxiety     Emotional Outbursts     Isolating Yourself    Blood System: none    Immune System: Allergies     Rash     Hives    Social History:     What is your marital status? Single    How many children do you have?       What are your living arrangements? Living with friends    What is your highest level of education? Masters/Graduate Degree    What is your employment status? Unemployed/part-time, because of pain    Occupation [employer]    What do you do for exercise? Gym lift weights treadmill    How often do you exercise? Three times a week    Have you ever used tobacco products? No    How often do you drink alcohol? No    Have you ever used recreational drugs? No    Have you ever been in treatment for any kind of substance abuse (alcohol or any kind of drugs)?   No    Do you currently use recreational drugs?  No    Do you currently use another person's prescription medications? No    Have you ever been in treatment for drug or alcohol dependency? No          Previous Medication Trials: flexeril, Ibuprofen, voltaren gel, percocet, gabapentin    Past Medical History:   Diagnosis Date   ??? Anxiety    ??? Asthma    ??? Autoimmune autonomic neuropathy    ??? Breast cyst    ??? Financial difficulties    ??? Migraines    ??? Neuromuscular disorder (CMS-HCC)    ??? Scleroderma (CMS-HCC)    ??? Visual impairment      Past Surgical History:   Procedure Laterality Date   ??? CHOLECYSTECTOMY     ??? HYSTERECTOMY Bilateral     08/2017   ??? MASTECTOMY      04/2018   ??? OOPHORECTOMY  2005    laparotomy   ??? PR CYSTOURETHROSCOPY N/A 08/30/2017    Procedure: CYSTOURETHROSCOPY (SEPARATE PROCEDURE);  Surgeon: Forbes Cellar, MD;  [facility]; Service: Advanced Laparoscopy   ??? PR EXCISION TURBINATE,SUBMUCOUS Bilateral 01/04/2021    Procedure: SUBMUCOUS RESECTION INFERIOR TURBINATE, PARTIAL OR COMPLETE, ANY METHOD;  Surgeon: Adam Swaziland Kimple, MD;  Location: [facility] OR [facility];  Service: ENT   ??? PR LAPAROSCOPY W TOT HYSTERECTUTERUS <=250 GRAM  W TUBE/OVARY N/A 08/30/2017    Procedure: LAPAROSCOPY, SURGICAL, WITH TOTAL HYSTERECTOMY, FOR UTERUS 250 G OR LESS; W/REMOVAL TUBE(S) AND/OR OVARY(S);  Surgeon: Forbes Cellar, MD;  [facility];  Service: Advanced Laparoscopy   ??? PR MASTECTOMY, SIMPLE, COMPLETE Bilateral 01/09/2020    Procedure: MASTECTOMY, SIMPLE, COMPLETE;  Surgeon: Carola Rhine, MD;  Location: MAIN OR [facility];  Service: Plastics   ??? PR REPAIR OF NASAL SEPTUM N/A 01/04/2021    Procedure: SEPTOPLASTY OR SUBMUCOUS RESECTION, WITH OR WITHOUT CARTILAGE SCORING, CONTOURING OR REPLACEMENT WITH GRAFT;  Surgeon: Adam Swaziland Kimple, MD;  Location: [facility] OR [facility];  Service: ENT   ??? PR UPPER GI ENDOSCOPY,BIOPSY N/A 08/13/2019  Procedure: UGI ENDOSCOPY; WITH BIOPSY, SINGLE OR MULTIPLE;  Surgeon: Alfred Levins, MD;  Location: [facility] PROCEDURES [facility];  Service: Gastroenterology   ??? SINUS SURGERY       Family History   Problem Relation Age of Onset   ??? Breast cancer Sister    ??? Cancer Sister         breast   ??? Ovarian cancer Maternal Grandmother    ??? Breast cancer Maternal Grandmother    ??? Cataracts Maternal Grandmother    ??? Cancer Mother         breast   ??? No Known Problems Father        Social History:  He reports that he has never smoked. He has never used smokeless tobacco. He reports that he does not currently use alcohol. He reports current drug use. Drug: Marijuana.        Allergies as of 09/08/2021 - Reviewed 08/24/2021   Allergen Reaction Noted   ??? Latex Rash and Hives 09/16/2016   ??? Penicillins Anaphylaxis 09/16/2016   ??? Shellfish containing products Anaphylaxis 01/23/2017   ??? Latex, natural rubber Rash 08/20/2015 Current Outpatient Medications   Medication Sig Dispense Refill   ??? azelastine (ASTELIN) 137 mcg (0.1 %) nasal spray Instill 2 sprays into each nostril two (2) times a day as needed for rhinitis. 30 mL 11   ??? budesonide-formoteroL (SYMBICORT) 160-4.5 mcg/actuation inhaler Inhale 1 puff by mouth Two (2) times a day. 10.2 g 5   ??? cetirizine (ZYRTEC) 10 MG tablet Take 1 tablet (10 mg total) by mouth Two (2) times a day. 30 tablet 12   ??? clindamycin (CLEOCIN T) 1 % lotion      ??? clonazePAM (KLONOPIN) 0.5 MG tablet Take 1 tablet (0.5 mg total) by mouth daily as needed for anxiety. 30 tablet 0   ??? cyclobenzaprine (FLEXERIL) 5 MG tablet Take 1 tablet (5 mg total) by mouth three (3) times a day as needed for muscle spasms. 9 tablet 0   ??? dextroamphetamine-amphetamine (ADDERALL) 20 mg tablet Take 1 tablet (20 mg total) by mouth two (2) times a day. 60 tablet 0   ??? diclofenac sodium (VOLTAREN) 1 % gel Apply 2 g topically four (4) times a day. 100 g 0   ??? emtricitabine-tenofovir, TDF, (TRUVADA) 200-300 mg per tablet Take 1 tablet by mouth daily. 30 tablet 11   ??? EPINEPHrine (EPIPEN) 0.3 mg/0.3 mL injection Inject 0.3 mL (0.3 mg total) under the skin once for 1 dose. 1 each 1   ??? fluticasone propionate (FLONASE) 50 mcg/actuation nasal spray Instill 2 sprays into each nostril two (2) times a day. 16 g 1   ??? gabapentin (NEURONTIN) 250 mg/5 mL oral solution Take 10 mL (500 mg total) by mouth Three (3) times a day. 990 mL 0   ??? inhalational spacing device Spcr Use as directed with albuterol and symbicort 1 each 1   ??? needle, disp, 18 G 18 Obediah x 1 1/2 Ndle For drawing hormone for injection. 25 each 0   ??? needle, disp, 25 Iaan 25 Lashun x 5/8 Ndle For subcutaneous hormone injection. 25 each 0   ??? nifedipine 0.3% lidocaine 1.5% in petrolatum ointment Apply a pea-size amount to anal area 3 times a day 100 g 0   ??? omalizumab (XOLAIR) 150 mg/mL syringe Inject 2 mL (300 mg total) under the skin every twenty-eight (28) days. 2 mL 12 ??? oxyCODONE-acetaminophen (PERCOCET) 5-325 mg per tablet Take 1 tablet by  mouth two (2) times a day as needed for pain. 60 tablet 0   ??? pantoprazole (PROTONIX) 20 MG tablet Take 1 tablet (20 mg total) by mouth daily. 30 tablet 1   ??? plecanatide (TRULANCE) 3 mg Tab Take one tablet by mouth every day 30 tablet 2   ??? syringe, disposable, 1 mL Syrg For subcutaneous hormone injection. 25 each 0   ??? syringe, disposable, 2.5 mL Syrg Use weekly 30 Syringe 2   ??? testosterone cypionate (DEPOTESTOTERONE CYPIONATE) 200 mg/mL injection Inject 0.2 mL (40 mg total) under the skin every seven (7) days. 2 mL 0   ??? triamcinolone (KENALOG) 0.1 % cream Apply topically Two (2) times a day. 15 g 0     Current Facility-Administered Medications   Medication Dose Route Frequency Provider Last Rate Last Admin   ??? dexamethasone (DECADRON) 4 mg/mL injection 4 mg  4 mg Intra-articular Once Parke Poisson, MD       ??? omalizumab [name]) injection 300 mg  300 mg Subcutaneous Q28 Days [name], MD   300 mg at 08/10/21 1052   ??? ropivacaine (NAROPIN) 5 mg/mL (0.5 %) injection 6 mL  6 mL Intra-articular Once Parke Poisson, MD       ??? triamcinolone acetonide (KENALOG-40) injection 40 mg  40 mg Intra-articular Once Shawn Halford Chessman, MD           Imaging/Tests:     EXAM: MRA CHEST W WO CONTRAST  DATE: 09/03/2021 9:22 AM  ACCESSION: [id number]  DICTATED: 09/03/2021 9:23 AM  INTERPRETATION LOCATION: [facility]  ??  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  COMPARISON: Same day MRI brachial plexus, MRI right shoulder 09/20/2019  ??  TECHNIQUE: Multiphase MR angiography of the proximal bilateral upper extremities and chest was performed as per the thoracic outlet syndrome protocol. Initially, the patient was positioned with their arms extended above the head, and axial MR images were obtained during contrast administration. Subsequently, the patient was repositioned with their arms placed in the neutral position at their side, and axial MR images were obtained during contrast administration Multiplanar reformatted and MIP images were provided for further evaluation of the vessels.  ??  VASCULAR FINDINGS:  ??  GREAT VESSELS: Patent three-vessel branching pattern.    ??  THORACIC AORTA: Normal caliber.  ??  PULMONARY ARTERIES/VEINS: No filling defects.  ??  HEART: Normal heart size.   ??  RIGHT: There is no dynamic narrowing of the thoracic outlet during stress positioning, noting no substantial visual change in vascular short axis dimension. No well-developed venous collaterals are seen about the shoulder girdle. No evidence of thrombosis.  There is non dynamic narrowing of the left axillary vein.  ??  LEFT: There is no dynamic narrowing of the thoracic outlet during stress positioning, noting no substantial visual change in vascular short axis dimension. No well-developed venous collaterals are seen about the shoulder girdle. No evidence of thrombosis.  ??  NON-VASCULAR FINDINGS:  ??  LOWER NECK: Unremarkable.  ??  LUNGS: Unremarkable.  ??  PLEURA: No pleural effusion, thickening, or pneumothorax.  ??  MEDIASTINUM AND LYMPH NODES: No enlarged thoracic lymph nodes in the partially imaged mediastinum.  ??  BONES AND SOFT TISSUES: No cervical ribs are seen. The transverse processes of C7 are not hypertrophied relative to the transverse processes of T1. Serpiginous signal abnormality within  the right right humeral head (106:28), new since prior MRI shoulder. There is asymmetric enhancement within the right subacromial bursa.  ??  IMPRESSION:  ??  No evidence of thoracic outlet syndrome.  ??  Serpiginous signal abnormality within the right right humeral, new since prior MRI shoulder, with thickening of the subacromial bursa which may represent avascular necrosis and bursitis. A component of adhesive capsulitis is also likely present.  Consider a dedicated MRI of the right shoulder.  ??  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  DATE: 09/03/2021 9:22 AM  ACCESSION: [id number]  DICTATED: 09/03/2021 9:28 AM  INTERPRETATION LOCATION: [facility]  ??  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  COMPARISON: None  ??  TECHNIQUE: Multiplanar, multisequence MR imaging of the brachial plexus was performed without and with I.V. contrast.  ??  FINDINGS:    No mass or abnormal enhancement along the course of the brachial plexi. Cervical nerve roots appear unremarkable. The supraclavicular and axillary regions appear normal. No lymphadenopathy.  ??  There is minimal dynamic narrowing of the proximal subclavian veins at the level of the costoclavicular space with arm abduction which is likely physiologic.  ??  Multiple small disc osteophyte complexes with mild bilateral foraminal narrowing at C6-7. No additional spinal canal or foraminal stenosis. No abnormal spinal cord signal.  ??  IMPRESSION:  ??  1. Minimal dynamic narrowing of the proximal subclavian veins with arm abduction is likely physiologic but can be seen with thoracic outlet syndrome.  ??  2. Multilevel cervical spondylosis with mild bilateral foraminal narrowing at C6-7.    Lab Results   Component Value Date    PLT 340 06/24/2021     Lab Results   Component Value Date    CREATININE 0.97 (H) 04/21/2021     Lab Results   Component Value Date    ALKPHOS 103 02/23/2021    BILITOT 0.4 02/23/2021    PROT 7.1 02/23/2021    ALBUMIN 4.1 02/23/2021    ALT 13 02/23/2021    AST 22 02/23/2021             Objective:     PHYSICAL EXAM:  BP 120/78  - Pulse 79  - Temp 36.7 ??C (98.1 ??F) (Skin)  - Resp 16  - LMP 07/20/2017 (Exact Date) - SpO2 98%     Wt Readings from Last 3 Encounters:   08/19/21 70.8 kg (156 lb)   08/19/21 70.9 kg (156 lb 6.4 oz)   07/22/21 71.2 kg (157 lb)     GENERAL:  The patient is a well developed, well-nourished male and appears to be in no apparent distress. The patient is pleasant and interactive. Patient is a good historian.    HEAD/NECK:    Reveals normocephalic/atraumatic.  Range of motion- decreased extension, decreased tilt and rotation to the left (right trap tightness). Pain with palpation over the Right scalene muscle is present.  This does  reproduce arm symptoms.  CARDIOVASCULAR:   Regular rate, pulses equal in upper extremities  RESPIRATORY:   Normal work of breathing  EXTREMITIES:  no clubbing, cyanosis, or edema was noted.  GASTROINTESTINAL:  Soft, nondistended  NEUROLOGIC:    The patient was alert and  oriented times 3 with normal language, attention, cognition and memory. Cranial nerve exam was normal.    Sensation Light touch sensation is impaired in RUE forearm.Stacy Gardner maneuver: negative bilateral.  ULTT: positive right.  EAST: positive right  MUSCULOSKELETAL:    Appropriate muscle mass in the upper and lower extremities bilaterally.  Strength 5/5 in lower extremities.  LUE 5/5.  RUE 5/5 in intrinsic muscles of hand, elbow flex/ext 4/5, shoulder abduction 5/5.  Pain with palpation over the Right coracoid process is present.  This does  reproduce arm symptoms.  REFLEXES:  Reflexes were +2 and symmetric  in all four extremities.   GAIT:  The patient rises from a seated position with no difficulty.  The patient was able to ambulate with no difficulty throughout the clinic today without the assistance of a walking aid-None.   SPINE:  The patient does  have pain with palpation of the Cervical spine.    JOINTS:  Good range of motion of all extremities with joints all appearing to be within normal limits.  SKIN:   No obvious rashes lesions or erythema on the exposed skin  PSYCHIATRIC:  Appropriate affect today in clinic skin  PSYCHIATRIC:  Appropriate affect today in clinic          I {ACTIONS; HAVE/HAVE [id number]} personally reviewed the patient's medical record.   I {ACTIONS; HAVE/HAVE [id number] personally reviewed the patient's clinical labs and/or urine toxicology studies  I {ACTIONS; HAVE/HAVE XLK:[id number]} independently reviewed patient's imaging studies.   I {ACTIONS; HAVE/HAVE [id number] requested patient's old medical records from the prior provider.   The patient's significant other and/or family member/friend {was/was not:[phone]::was not} present during this visit and has contributed important medical information as pertinent to patient's medical history.   I {ACTIONS; HAVE/HAVE UVO:[id number]} reviewed the [place] Narcotic Database today

## 2021-09-09 NOTE — Unmapped (Unsigned)
PROCEDURE;  Right  pectoralis minor muscle injection under ultrasound guidance.  PRE-PROCEDURE DIAGNOSIS: Pectoralis minor syndrome  POST-PROCEDURE DIAGNOSIS: Same     PERFORMED BY: Dr. Criss Rosales  ASSISTANT: ***  Anesthesia: Local     INTERIM HISTORY:      The patient is a 41 y.o. 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  They present today for a diagnostic pectoralis minor muscle injection.    Results of anterior scalene injection: ***    Ultrasound is required for this procedure in order to be as specific as possible.  We are performing diagnostic injections into a very specific muscle.  In order to do that safely and accurately, in order to mimic what a surgical resection would do, we must use ultrasound.  If we didn't do ultrasound we could inject the incorrect muscle or could inject into the nerve plexus causing a nerve block instead of a diagnostic muscle injection.  For the pectoralis minor, this is a muscle deep over the chest wall, ultrasound decreases the risk of pneumothorax.     DESCRIPTION OF THE PROCEDURE:   The above procedure was decided to be performed and discussed with the patient.  Informed consent was obtained and potential risks discussed including, but not limited to: bleeding, bruising, severe allergic reaction to components of the injection materials, infection, nerve damage, damage to blood vessels and other structures, temporary brachial plexus block, pneumothorax, and the possibility of no benefit (pain relief) derived from the injection, or in rare occasions worsening of pain. Questions were answered to the patient's satisfaction and the patient wishes to proceed.  Alternative options for treatment have previously been discussed and explored with the patient. The patient {is/is not:23060} taking antiplatelet or anticoagulation medications and {DOES /DOES WJX:91478} have a driver today.     The patient was taken to the procedure room and placed supine . The procedure was performed under ultrasound guidance. The patient's heart rate, blood pressure, and oxygen saturation were monitored throughout the procedure. A timeout was performed and the correct side confirmed with the patient.      The area of interest was sterilely prepped with chlorhexidinex2 and sterilely draped. The physician wore a hat, mask and sterile gloves.      Ultrasound was used to visualize the pectoralis minor muscle and its attachment to the coracoid process.  Then a skin wheal was made with a 25g 1.5 inch needle and 0.5% lidocaine.  Then a {ajlpecminorneedle:73589::25gauge 1.5 inch} needle was inserted into the {Right Left Bilateral:36550} pectoralis minor muscle using ultrasound guidance for visualization under in plane visualization. Negative aspiration was confirmed. Contrast was not used. Then 1ml of 0.25% bupivacaine was injected at TWO locations within muscle for a total of 2ml and the needle was removed intact     The patient {did/did not:56813} tolerate the procedure well. All injection sites were sterilely dressed. The patient was discharged after an appropriate period of observation and post procedural education was given.     Pre-procedure pain score was ***/10  Post-procedure pain score was ***/10     DISPOSITION:   Pt will f/u with Vascular Surgery

## 2021-09-10 ENCOUNTER — Ambulatory Visit
Admit: 2021-09-10 | Discharge: 2021-09-11 | Payer: PRIVATE HEALTH INSURANCE | Attending: Anesthesiology | Primary: Anesthesiology

## 2021-09-10 ENCOUNTER — Ambulatory Visit: Admit: 2021-09-10 | Discharge: 2021-09-11 | Payer: PRIVATE HEALTH INSURANCE

## 2021-09-10 DIAGNOSIS — M7918 Myalgia, other site: Principal | ICD-10-CM

## 2021-09-10 MED ADMIN — lidocaine (XYLOCAINE) 5 mg/mL (0.5 %) injection: @ 13:00:00 | Stop: 2021-09-10

## 2021-09-10 MED ADMIN — BUPivacaine (PF) (MARCAINE) 0.25 % (2.5 mg/mL) injection (PF): @ 13:00:00 | Stop: 2021-09-10

## 2021-09-10 NOTE — Unmapped (Signed)
[facility] Administered Medication Refill Coordination Note      NAME:Vincent Waters DOB: June 17, 1981      Medication: Xolair    Day Supply: 28 days      SHIPPING      Next delivery from [facility] [phone]) to [facility]  for [name] is scheduled for 03/29.    Clinic contact: [name]    Patient's next nurse visit for administration: unsure nxt appt.    We will follow up with clinic monthly for standard refill processing and delivery.      Vincent Waters Samella Parr  Specialty Pharmacy Technician

## 2021-09-11 NOTE — Unmapped (Signed)
I saw and evaluated the patient, participating in the key portions of the service.  I reviewed the resident's note.  I agree with the resident's findings and plan. Simone Curia, MD

## 2021-09-13 ENCOUNTER — Institutional Professional Consult (permissible substitution): Admit: 2021-09-13 | Discharge: 2021-09-14 | Payer: PRIVATE HEALTH INSURANCE

## 2021-09-13 DIAGNOSIS — L501 Idiopathic urticaria: Principal | ICD-10-CM

## 2021-09-13 MED ADMIN — omalizumab (XOLAIR) injection 300 mg: 300 mg | SUBCUTANEOUS | @ 15:00:00 | Stop: 2022-04-05

## 2021-09-13 NOTE — Unmapped (Signed)
Patient Advice Request     Patient Name: Vincent Waters  Caller: Self (Patient)  Contact Method: MyChart  Reason for Call: messaged pcp on mychart a few weeks ago, has not received a reply and needs advice, as well as med refills for adderall, percocet, clonezapam (pharmacy was able to refill others, but not these). Patient has upcoming appt   Previously Discussed: yes  Appointment Offered: Yes

## 2021-09-13 NOTE — Unmapped (Unsigned)
Nutrition Consult Note    Weight History:  Wt Readings from Last 6 Encounters:   08/19/21 70.8 kg (156 lb)   08/19/21 70.9 kg (156 lb 6.4 oz)   07/22/21 71.2 kg (157 lb)   07/15/21 70.8 kg (156 lb)   06/24/21 70.8 kg (156 lb)   06/01/21 70.3 kg (155 lb)       Relevant Medications, Herbs, Supplements:    Current Outpatient Medications:   ???  azelastine (ASTELIN) 137 mcg (0.1 %) nasal spray, Instill 2 sprays into each nostril two (2) times a day as needed for rhinitis., Disp: 30 mL, Rfl: 11  ???  budesonide-formoteroL (SYMBICORT) 160-4.5 mcg/actuation inhaler, Inhale 1 puff by mouth Two (2) times a day., Disp: 10.2 g, Rfl: 5  ???  cetirizine (ZYRTEC) 10 MG tablet, Take 1 tablet (10 mg total) by mouth Two (2) times a day., Disp: 30 tablet, Rfl: 12  ???  clindamycin (CLEOCIN T) 1 % lotion, , Disp: , Rfl:   ???  clonazePAM (KLONOPIN) 0.5 MG tablet, Take 1 tablet (0.5 mg total) by mouth daily as needed for anxiety., Disp: 30 tablet, Rfl: 0  ???  cyclobenzaprine (FLEXERIL) 5 MG tablet, Take 1 tablet (5 mg total) by mouth three (3) times a day as needed for muscle spasms., Disp: 9 tablet, Rfl: 0  ???  dextroamphetamine-amphetamine (ADDERALL) 20 mg tablet, Take 1 tablet (20 mg total) by mouth two (2) times a day., Disp: 60 tablet, Rfl: 0  ???  diclofenac sodium (VOLTAREN) 1 % gel, Apply 2 g topically four (4) times a day., Disp: 100 g, Rfl: 0  ???  emtricitabine-tenofovir, TDF, (TRUVADA) 200-300 mg per tablet, Take 1 tablet by mouth daily., Disp: 30 tablet, Rfl: 11  ???  EPINEPHrine (EPIPEN) 0.3 mg/0.3 mL injection, Inject 0.3 mL (0.3 mg total) under the skin once for 1 dose., Disp: 1 each, Rfl: 1  ???  fluticasone propionate (FLONASE) 50 mcg/actuation nasal spray, Instill 2 sprays into each nostril two (2) times a day., Disp: 16 g, Rfl: 1  ???  gabapentin (NEURONTIN) 250 mg/5 mL oral solution, Take 10 mL (500 mg total) by mouth Three (3) times a day., Disp: 990 mL, Rfl: 0  ???  inhalational spacing device Spcr, Use as directed with albuterol and symbicort, Disp: 1 each, Rfl: 1  ???  needle, disp, 18 G 18 Leondro x 1 1/2 Ndle, For drawing hormone for injection., Disp: 25 each, Rfl: 0  ???  needle, disp, 25 Alanzo 25 Dmari x 5/8 Ndle, For subcutaneous hormone injection., Disp: 25 each, Rfl: 0  ???  nifedipine 0.3% lidocaine 1.5% in petrolatum ointment, Apply a pea-size amount to anal area 3 times a day, Disp: 100 g, Rfl: 0  ???  omalizumab (XOLAIR) 150 mg/mL syringe, Inject 2 mL (300 mg total) under the skin every twenty-eight (28) days., Disp: 2 mL, Rfl: 12  ???  pantoprazole (PROTONIX) 20 MG tablet, Take 1 tablet (20 mg total) by mouth daily., Disp: 30 tablet, Rfl: 1  ???  plecanatide (TRULANCE) 3 mg Tab, Take one tablet by mouth every day, Disp: 30 tablet, Rfl: 2  ???  syringe, disposable, 1 mL Syrg, For subcutaneous hormone injection., Disp: 25 each, Rfl: 0  ???  syringe, disposable, 2.5 mL Syrg, Use weekly, Disp: 30 Syringe, Rfl: 2  ???  testosterone cypionate (DEPOTESTOTERONE CYPIONATE) 200 mg/mL injection, Inject 0.2 mL (40 mg total) under the skin every seven (7) days., Disp: 2 mL, Rfl: 0  ???  triamcinolone (KENALOG)  0.1 % cream, Apply topically Two (2) times a day., Disp: 15 g, Rfl: 0    Current Facility-Administered Medications:   ???  dexamethasone (DECADRON) 4 mg/mL injection 4 mg, 4 mg, Intra-articular, Once, Parke Poisson, MD  ???  omalizumab [name]) injection 300 mg, 300 mg, Subcutaneous, Q28 Days, [name], MD, 300 mg at 08/10/21 1052  ???  ropivacaine (NAROPIN) 5 mg/mL (0.5 %) injection 6 mL, 6 mL, Intra-articular, Once, Parke Poisson, MD  ???  triamcinolone acetonide (KENALOG-40) injection 40 mg, 40 mg, Intra-articular, Once, Parke Poisson, MD    Relevant Labs:  Hemoglobin A1C (%)   Date Value   08/03/2017 4.9      No components found for: LDLCALC   BP Readings from Last 3 Encounters:   09/10/21 131/102   09/08/21 120/78   08/19/21 117/76     Lab Results   Component Value Date    CHOL 187 01/22/2020     Lab Results   Component Value Date    HDL 47 01/22/2020     Lab Results   Component Value Date    LDL 123 (H) 01/22/2020     Lab Results   Component Value Date    VLDL 16.8 01/22/2020     Lab Results   Component Value Date    CHOLHDLRATIO 4.0 01/22/2020     Lab Results   Component Value Date    TRIG 84 01/22/2020     Lab Results   Component Value Date    VITDTOTAL 22.0 01/11/2021     Lab Results   Component Value Date    VITAMINB12 297 06/24/2021       24-Hour recall/usual intake:    Time Intake   Breakfast ***   Snack (AM) ***   Lunch ***   Snack (PM) ***   Dinner ***   Snack (HS) ***     Additional Pertinent Nutrition/Lifestyle-Related Information (baseline):  Water: ***  Alcohol intake: ***  Other Beverages: ***    Dining out: ***  Snacks: ***  Nighttime snacking: ***  Eating pace: ***  Emotional eating: ***  Food preparation: ***  Food Insecurity: ***  Likes: ***  Dislikes: ***    Gastrointestinal: ***  Constipation/diarrhea: ***  Bloating/distention: ***  Flatus: ***  Abdominal pain: ***  Steatorrhea: ***  Hematochezia/melena: ***  Nausea/vomiting: ***  Reflux: ***  Early satiety: ***  Appetite: ***  Dysgeusia: ***  Dysphagia: ***  Xerostomia: ***  Oral health: ***  Food allergies/intolerances/sensitivities: ***    Edema: ***  Sleep: *** hours; *** quality  Mental Health: *** high/moderate/low stress/anxiety/depression  Tobacco: ***  Patient stated reason(s) for nutrition changes: ***  Other: ***     Physical Activity: {misc; exercise types:16438}   Barriers to physical activity: ***     Barriers:  {AMCSDOHNUTRITION:84213}    Estimated Daily Needs:  {Energy Needs:57594}  *** g protein  {Nutrient Needs:58116:p}    Nutrition Intervention:  {Nutrition Interventions:3047301} - supportive listening    Materials Provided:  None    Discuss at next outreach:   ***    Nutrition Goals:  ***    Follow-up:  ***    Tretha Sciara, RD, LDN, CDCES  Registered Dietitian Nutritionist   Certified Diabetes Care and Education Specialist

## 2021-09-14 MED FILL — XOLAIR 150 MG/ML SUBCUTANEOUS SYRINGE: SUBCUTANEOUS | 28 days supply | Qty: 2 | Fill #4

## 2021-09-15 DIAGNOSIS — G894 Chronic pain syndrome: Principal | ICD-10-CM

## 2021-09-15 DIAGNOSIS — F902 Attention-deficit hyperactivity disorder, combined type: Principal | ICD-10-CM

## 2021-09-15 DIAGNOSIS — F411 Generalized anxiety disorder: Principal | ICD-10-CM

## 2021-09-15 MED ORDER — DEXTROAMPHETAMINE-AMPHETAMINE 20 MG TABLET
ORAL_TABLET | Freq: Two times a day (BID) | ORAL | 0 refills | 30.00000 days | Status: CN
Start: 2021-09-15 — End: 2021-10-15

## 2021-09-15 MED ORDER — CLONAZEPAM 0.5 MG TABLET
ORAL_TABLET | Freq: Every day | ORAL | 0 refills | 30.00000 days | Status: CN | PRN
Start: 2021-09-15 — End: 2021-10-15

## 2021-09-15 MED ORDER — OXYCODONE-ACETAMINOPHEN 5 MG-325 MG TABLET
ORAL_TABLET | Freq: Two times a day (BID) | ORAL | 0 refills | 30 days | PRN
Start: 2021-09-15 — End: 2021-10-15

## 2021-09-15 MED ORDER — GABAPENTIN 250 MG/5 ML ORAL SOLUTION
Freq: Three times a day (TID) | ORAL | 0 refills | 33.00000 days | Status: CN
Start: 2021-09-15 — End: 2021-10-18

## 2021-09-15 NOTE — Unmapped (Signed)
Complex Case Management  SUMMARY NOTE    Attempted to contact pt today at Cell number to complete initial nutrition assessment. Left message to return call.; 1st attempt    Discuss at next visit: complete initial nutrition assessment       Jsaon Yoo, RD, LDN, CDCES  Registered Dietitian Nutritionist  Certified Diabetes Care and Education Specialist

## 2021-09-15 NOTE — Unmapped (Signed)
COMPLEX CASE MANAGEMENT   FOLLOW UP NOTE  Summary:  High Risk Care Coordinator   spoke with patient and verified correct patient using two identifiers today for Complex Case Management follow up. Patient currently resides at Home. Primary concern is none at the moment.      Subjective:    Patient/caregiver reported he has been busy today to receive a call back with RD but pt has been rescheduled for 09/27/2021 at 9 AM. Pt stated he had not received any ACP paperwork mailed out to him. [facility] to re-send paperwork to pt. Pt states he did receive a call from [facility] but he has not had a chance to call back. Pt plans on calling back today. No other concerns at the moment.       Objective:     Screenings Completed during visit: None completed at this visit    Barriers to care: None Identified    Interventions provided: Advanced Care Planning Education      Plan:     1. Case Management to: maile out ACP paperwork.     2. Patient/caregiver to: notify PCP/CCM as needed.       Discuss at next outreach: RD Referral and Advanced Care Planning    Care Coordination Note updated in [facility]: No    Future Appointments   Date Time Provider Department Center   09/21/2021  9:30 AM Neftali Lorenzo, PsyD [facility]   09/23/2021 11:30 AM Thora Lance, MD [facility]   09/27/2021  9:00 AM Rudene Christians Forestine Na, RD/LDN [facility]   10/15/2021  8:00 AM Kern Reap, MD [facility]   10/22/2021  8:00 AM Kern Reap, MD [facility]   11/23/2021  9:30 AM Mena Goes, PsyD [facility]   11/23/2021  2:15 PM Kern Reap, MD [facility]       Problem List     ??? Frozen shoulder    ??? Severe allergic reaction    ??? Breast cancer screening, high risk patient    ??? Constipation    ??? Dermographia    ??? Acid reflux    ??? Mild intermittent asthma    ??? Nondiabetic gastroparesis    ??? Nasal inflammation due to allergen    ??? Migraines    ??? IRON DEFICIENCY ANEMIA    ??? BRCA2 positive    ??? CHRONIC PAIN SYNDROME    ??? Folliculitis    ??? Eye pain    ??? Vitamin D deficiency    ??? BREATHING DIFFICULTY    ??? Hemorrhoids    ??? Encounter for therapeutic drug monitoring    ??? Elevated red blood cell count    ??? Chronic inflammation of the nose    ??? Sinus infection    ??? 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 Latex Rash and Hives   ??? Penicillins Anaphylaxis     Pt can tolerate amoxicillin   Has patient had a PCN reaction causing immediate rash, facial/tongue/throat swelling, SOB or lightheadedness with hypotension: no   Has patient had a PCN reaction causing severe rash involving mucus membranes or skin necrosis: no  Has patient had a PCN reaction that required hospitalization: unknown  Has patient had a PCN reaction occurring within the last 10 years: no  If all of the above answers are NO, then may proceed with Cephalosporin use.   ???  Shellfish Containing Products Anaphylaxis   ??? Latex, Natural Rubber Rash       Medications:  Prior to Admission medications    Medication Dose, Route, Frequency   azelastine (ASTELIN) 137 mcg (0.1 %) nasal spray Instill 2 sprays into each nostril two (2) times a day as needed for rhinitis.   budesonide-formoteroL (SYMBICORT) 160-4.5 mcg/actuation inhaler Inhale 1 puff by mouth Two (2) times a day.   cetirizine (ZYRTEC) 10 MG tablet 10 mg, Oral, 2 times a day (standard)   clindamycin (CLEOCIN T) 1 % lotion No dose, route, or frequency recorded.   clonazePAM (KLONOPIN) 0.5 MG tablet 0.5 mg, Oral, Daily PRN   cyclobenzaprine (FLEXERIL) 5 MG tablet Take 1 tablet (5 mg total) by mouth three (3) times a day as needed for muscle spasms.   dextroamphetamine-amphetamine (ADDERALL) 20 mg tablet 20 mg, Oral, 2 times a day   diclofenac sodium (VOLTAREN) 1 % gel 2 g, Topical, 4 times a day   emtricitabine-tenofovir, TDF, (TRUVADA) 200-300 mg per tablet 1 tablet, Oral, Daily (standard)   EPINEPHrine (EPIPEN) 0.3 mg/0.3 mL injection 0.3 mg, Subcutaneous, Once   fluticasone propionate (FLONASE) 50 mcg/actuation nasal spray Instill 2 sprays into each nostril two (2) times a day.   gabapentin (NEURONTIN) 250 mg/5 mL oral solution 500 mg, Oral, 3 times a day (standard)   inhalational spacing device Spcr Use as directed with albuterol and symbicort   needle, disp, 18 G 18 Tavaras x 1 1/2 Ndle For drawing hormone for injection.   needle, disp, 25 Baley 25 Lebert x 5/8 Ndle For subcutaneous hormone injection.   nifedipine 0.3% lidocaine 1.5% in petrolatum ointment Apply a pea-size amount to anal area 3 times a day   omalizumab (XOLAIR) 150 mg/mL syringe 300 mg, Subcutaneous, Every 28 days   pantoprazole (PROTONIX) 20 MG tablet 20 mg, Oral, Daily (standard)   plecanatide (TRULANCE) 3 mg Tab Take one tablet by mouth every day   syringe, disposable, 1 mL Syrg For subcutaneous hormone injection.   syringe, disposable, 2.5 mL Syrg Use weekly   testosterone cypionate (DEPOTESTOTERONE CYPIONATE) 200 mg/mL injection 40 mg, Subcutaneous, Every 7 days   triamcinolone (KENALOG) 0.1 % cream Topical, 2 times a day (standard)          Khaleed Holan  09/15/2021

## 2021-09-15 NOTE — Unmapped (Signed)
Medication Request     Patient Name: Vincent Waters   Caller: Self (Patient)  Have you contacted your pharmacy? yes      Last Visit: 08/19/2021       Medication Name: Clonazepam, Adderall, Gabapentin, Oxycodone( Patient has ran out of all of his medication, please send all med refills today.   Dosage:0.5mg , 20mg , 250mg , 5-325mg .  Route: Oral (PO)  Frequency: Daily  Day Supply Requested: 66  Pharmacy (Name & Address): [facility]   [address], [place] [address]    Pharmacy Phone Number: Phone:  [phone] ??Fax:  [phone]

## 2021-09-16 DIAGNOSIS — G894 Chronic pain syndrome: Principal | ICD-10-CM

## 2021-09-16 DIAGNOSIS — F902 Attention-deficit hyperactivity disorder, combined type: Principal | ICD-10-CM

## 2021-09-16 DIAGNOSIS — F411 Generalized anxiety disorder: Principal | ICD-10-CM

## 2021-09-16 MED ORDER — GABAPENTIN 250 MG/5 ML ORAL SOLUTION
Freq: Three times a day (TID) | ORAL | 0 refills | 33 days
Start: 2021-09-16 — End: 2021-10-19

## 2021-09-17 DIAGNOSIS — R400 Somnolence: Principal | ICD-10-CM

## 2021-09-17 DIAGNOSIS — G4700 Insomnia, unspecified: Principal | ICD-10-CM

## 2021-09-17 DIAGNOSIS — G4733 Obstructive sleep apnea (adult) (pediatric): Principal | ICD-10-CM

## 2021-09-17 MED ORDER — OXYCODONE-ACETAMINOPHEN 5 MG-325 MG TABLET
ORAL_TABLET | Freq: Two times a day (BID) | ORAL | 0 refills | 30 days | Status: CP | PRN
Start: 2021-09-17 — End: 2021-10-17
  Filled 2021-09-20: qty 60, 30d supply, fill #0

## 2021-09-17 MED ORDER — CLONAZEPAM 0.5 MG TABLET
ORAL_TABLET | Freq: Every day | ORAL | 0 refills | 7 days | Status: CP | PRN
Start: 2021-09-17 — End: 2021-09-24
  Filled 2021-09-20: qty 7, 7d supply, fill #0

## 2021-09-17 NOTE — Unmapped (Addendum)
Addendum 09/20/21:    # Attention deficit hyperactivity disorder (ADHD), combined type - Chronic pain syndrome - Generalized anxiety disorder    Patient has been taking Adderall, Klonopin, and Percocet since before I assumed their care in November 2021.  Since then, UDS has consistently been negative for amphetamines and benzodiazepines.  Checked amphetamine confirmation with last UDS, which was also negative; I have not yet ordered a benzodiazepine confirmation.  PDMP shows appropriate regular refills of all 3 medications. Pt requested refills of these medications for Friday 3/31, as refills were due for 4/2, which was a Sunday. I refilled Percocet prescription given UDS and/or opiate confirmation consistently positive for opiates, as well as a 1 week course of clonazepam to prevent withdrawal.     Called pt multiple times over the past three days, and was able to reach pt on 09/20/21 afternoon to clarify their dosing schedule for Klonopin and Adderall given negative drug screens. Pt clarified that they are taking 1/2 tablet of Klonopin twice daily, and sometimes takes a full tablet for one of those doses if anxiety is particularly bad.  Only uses Adderall roughly twice per week, for when they particularly need to focus or feel like they are becoming too scattered/energetic, which could potentially explain negative amphetamine screen. Pt notes that they regularly have a large amount of medication left over every month, and takes this remaining supply to the pharmacy every month for safe disposal.      Patient is in agreement with reducing their Adderall prescription to 10 pills/month, to match the frequency with which they take it, and will come to clinic by this coming Thurs for repeat UDS w/ benzo and methamphetamine confirmation. Pt is also in agreement with referral to [facility] controlled substance board, and verbalizes understanding that this is an important measure to make sure I am prescribing his medications in the safest manner possible.  He is also in agreement with signing a controlled substance agreement at our next visit.    Additionally, patient expresses that they do not want to be on Klonopin long-term, and would like to find a more sustainable, safer solution to managing their anxiety.  Additionally, they are concerned that Adderall may be contributing to their anxiety, and would like to speak with a psychiatrist to help differentiate between their ADHD symptoms and anxiety symptoms. After our phone call, patient was able to get an appointment with Dr. Matilde Haymaker for tomorrow (4/4).  Can also discuss psychiatry referral at next visit.    - Toxicology Screen, Urine; Future  - Amphetamine, Urine Confirmation; Future  - Benzodiazepine, Urine; Future  - oxyCODONE-acetaminophen (PERCOCET) 5-325 mg per tablet; Take 1 tablet by mouth two (2) times a day as needed for pain.  Dispense: 60 tablet; Refill: 0  - clonazePAM (KLONOPIN) 0.5 MG tablet; Take 1 tablet (0.5 mg total) by mouth daily as needed for anxiety for up to 7 days.  Dispense: 7 tablet; Refill: 0    Alver Fisher, MD, MPH (he/him)  Resident Physician, PGY-2  Department of Family Medicine

## 2021-09-17 NOTE — Unmapped (Incomplete Revision)
#   Attention deficit hyperactivity disorder (ADHD), combined type - Chronic pain syndrome - Generalized anxiety disorder  Patient has been taking Adderall, Klonopin, and Percocet since before I assumed their care in November 2021.  Since then, UDS has consistently been negative for amphetamines and benzodiazepines.  Checked amphetamine confirmation with last UDS, which was also negative; I have not yet ordered a benzodiazepine confirmation.  PDMP shows appropriate regular refills of all 3 medications. Patient is due for refills of these medications on 4/2, which is a Sunday, and requests that they be filled for tomorrow, Friday 3/31.  I will refill Percocet prescription given UDS and/or opiate confirmation consistently positive for opiates, as well as a 1 week course of clonazepam to prevent withdrawal.     Is taking klonopin half tablet BID, sometimes takes a full tablet for one of those if anxiety is really bad. Will come in by Thurs for repeat UDS w/ klonopin and adderall confirmation    Only uses adderall around twice per week, takes remaining supply to pharmacy for safe disposal. Has a significant amount left over monthly. In agreement with reducing his prescription to 10 pills per month, will come back for UDS, ok with referral to controlled substance board to make sure we are doing everything safely.     Doesn't want to be on klonopin long-term, also concerned that adderall may be contributing to his anxiety. Would like to speak with a psych team, open to speaking with Neftali this week. Would benefit from formal psych referral.       I will call pt tomorrow to confirm their current adderall dosing schedule, and ask that they return for a UDS with benzodiazepine and amphetamine confirmation on 3/31 (or ASAP if 3/31 not possible).  Will refer patient to controlled medicine board for further evaluation prior to filling Adderall prescription given consistently negative UDS and now negative amphetamine confirmation despite patient regularly filling prescription at BID dosing.       - oxyCODONE-acetaminophen (PERCOCET) 5-325 mg per tablet; Take 1 tablet by mouth two (2) times a day as needed for pain.  Dispense: 60 tablet; Refill: 0  - clonazePAM (KLONOPIN) 0.5 MG tablet; Take 1 tablet (0.5 mg total) by mouth daily as needed for anxiety for up to 7 days.  Dispense: 7 tablet; Refill: 0  - Will ask pt to return for repeat UDS with benzo confirmation on 3/31 prior to filling klonopin beyond this 7 day supply  - Referral to controlled med board re: further adderall prescriptions

## 2021-09-17 NOTE — Unmapped (Signed)
1. Obstructive sleep apnea - Daytime sleepiness - Insomnia, unspecified type  Pt had sleep study on 08/24/21. For the entire night, the patient had a total of 30 respiratory event(s) for an AHI of 4.2 per hour. The NREM AHI is 4.7 event(s) per hour. The REM AHI is 2.8.  Pt has persistent symptoms including daytime sleepiness resulting from OSA, likely will benefit significantly from CPAP.  Order placed, patient to follow-up for CPAP compliance 31 to 90 days after initiation of CPAP therapy to ensure compliance of at least 4 hours per night for more than 70% of nights.  - CPAP Therapy and Supplies; Future  - Follow-up 31-90 days after initiation of CPAP    Alver Fisher, MD, MPH (he/him)  Resident Physician, PGY-2  Department of Family Medicine

## 2021-09-20 DIAGNOSIS — F902 Attention-deficit hyperactivity disorder, combined type: Principal | ICD-10-CM

## 2021-09-20 MED ORDER — DEXTROAMPHETAMINE-AMPHETAMINE 20 MG TABLET
ORAL_TABLET | Freq: Two times a day (BID) | ORAL | 0 refills | 30 days
Start: 2021-09-20 — End: 2021-10-20

## 2021-09-20 NOTE — Unmapped (Signed)
Error

## 2021-09-20 NOTE — Unmapped (Addendum)
Return TOS    Patient Name: Vincent Waters DOB:[birth year]  Date of Encounter: 09/23/2021    Assessment:  Patient is a 41 y.o. male with history of bilateral UE (R>L) pain and paresthesia likely consistent with neurogenic thoracic outlet syndrome. 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 MRI brachial plexus right 09/03/21 showing mild bilateral foraminal narrowing at C6-7. RUE scalene and pec minor injections were significantly positive. Patient would like to proceed with surgery.    The risks and benefits of first rib +/- cervical rib resection, scalenectomy, and brachial plexus neurolysis have been discussed with the patient. Approximately 70% of patients will experience near complete improvement, 20% will have some improvement, and 10% will not improve and may worsen after surgery. During surgery there is risk of injury to the sympathetic chain, phrenic nerve, thoracic duct, subclavian artery and vein, and to the pleura requiring chest tube placement. In the event of an uncomplicated surgery, the expected length of stay is 2-4 days. The patient understands and has consented to surgery.     Plan:  - We will plan for RUE decompression with pec minor release, tentatively scheduled for 11/16/21; consent signed.  - Preoperative labs and CXR ordered.      HPI: Vincent Waters is a 41 y.o. 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 Patient has obtained imaging and undergone diagnostic injections. Patient states that scalene injection 09/08/21 and pec minor injection 09/10/21 both provided total relief. He reiterates that PT has not provided relief. Patient states he is okay with having a foley catheter during surgery.     Current pain medications:  He is on Percocet 5-325 mg BID PRN.     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 For 4 years, he has had limited shoulder mobility, right neck pain and stiffness, shoulder pain radiating down the arm, and tingling and cramping in the fingers in an ulnar distribution. (Patient initially complained of RUE sx but at follow up endorses a component of LUE sx as well). He was previously seen at [facility] and had glenohumeral injections and PT for possible frozen shoulder with some improvement. Patient saw Dr. Clydene Pugh in Ortho in 09/2019 who felt sx were possibly 2/2 TOS vs. cervical radiculopathy. EMG was ordered at that time but never obtained. Patient has also considered rotator cuff surgery with a provider in [place]. Previous shoulder MRIs have found mild tendinosis of supraspinatus tendon. Patient has tried PT and dry needling without relief.     Past Medical History:   Past Medical History:   Diagnosis Date    Anxiety     Asthma     Autoimmune autonomic neuropathy     Breast cyst     Financial difficulties     Migraines     Neuromuscular disorder (CMS-HCC)     Scleroderma (CMS-HCC)     Visual impairment        Past Surgical History:  Past Surgical History:   Procedure Laterality Date    CHOLECYSTECTOMY      HYSTERECTOMY Bilateral     08/2017    MASTECTOMY      04/2018    OOPHORECTOMY  2005    laparotomy    PR CYSTOURETHROSCOPY N/A 08/30/2017  Procedure: CYSTOURETHROSCOPY (SEPARATE PROCEDURE);  Surgeon: Forbes Cellar, MD;  Location: [facility];  Service: Advanced Laparoscopy    PR EXCISION TURBINATE,SUBMUCOUS Bilateral 01/04/2021    Procedure: SUBMUCOUS RESECTION INFERIOR TURBINATE, PARTIAL OR COMPLETE, ANY METHOD;  Surgeon: Adam Swaziland Kimple, MD;  Location: [facility] OR [facility];  Service: ENT    PR LAPAROSCOPY W TOT HYSTERECTUTERUS <=250 GRAM  W TUBE/OVARY N/A 08/30/2017    Procedure: LAPAROSCOPY, SURGICAL, WITH TOTAL HYSTERECTOMY, FOR UTERUS 250 G OR LESS; W/REMOVAL TUBE(S) AND/OR OVARY(S);  Surgeon: Forbes Cellar, MD;  Location: [facility] OR [facility];  Service: Advanced Laparoscopy    PR MASTECTOMY, SIMPLE, COMPLETE Bilateral 01/09/2020    Procedure: MASTECTOMY, SIMPLE, COMPLETE;  Surgeon: Carola Rhine, MD;  Location: [facility]IN OR [place];  Service: Plastics    PR REPAIR OF NASAL SEPTUM N/A 01/04/2021    Procedure: SEPTOPLASTY OR SUBMUCOUS RESECTION, WITH OR WITHOUT CARTILAGE SCORING, CONTOURING OR REPLACEMENT WITH GRAFT;  Surgeon: Adam Swaziland Kimple, MD;  Location: [facility];  Service: ENT    PR UPPER GI ENDOSCOPY,BIOPSY N/A 08/13/2019    Procedure: UGI ENDOSCOPY; WITH BIOPSY, SINGLE OR MULTIPLE;  Surgeon: Alfred Levins, MD;  Location: [facility];  Service: Gastroenterology    SINUS SURGERY         Social History:  Patient  reports that he has never smoked. He has never used smokeless tobacco. He reports that he does not currently use alcohol. He reports current drug use. Drug: Marijuana.    ROS:  Balance of systems reviewed x 10 and negative except as noted in HPI.      DASH Score  Date   86.36 09/23/2021     Physical Examination:   Vitals:    09/23/21 1036   BP: 113/77   Pulse: 83     General: Well appearing, no acute distress  Psych: Awake, Alert , Oriented by 3. Affect is appropriate.  Neck: Supple, no evidence of JVD, no evidence of thyromegaly, no cervical bruits  Cardiac: RRR  Lungs: Easy work of breathing, CTA Bilaterally  Upper extremities: Warm and well perfused. Radial artery pulses bilaterally 2+. Limited RUE ROM and shoulder mobility.     Imaging (independently reviewed):    MRA chest 09/03/21 - Narrowing of the left axillary vein, unchanged on abduction and adduction, likely from prior deep vein thrombosis. Serpiginous signal abnormality within the right humeral, new since prior MRI shoulder, with thickening of the subacromial bursa which may represent avascular necrosis and bursitis. A component of adhesive capsulitis is also likely present  MRI brachial plexus right 09/03/21 - Minimal dynamic narrowing of the proximal subclavian veins with arm abduction. Multilevel cervical spondylosis with mild bilateral foraminal narrowing at C6-7.    30 minutes spent face-to-face counseling and coordination of care and greater than 50% of the visit was spent face-to-face    ______________________________________________________________________    Documentation assistance was provided by Azalee Course, Scribe, on September 23, 2021 at 10:49 AM for Doroteo Glassman, Juel Burrow, and Thora Lance, MD    ----------------------------------------------------------------------------------------------------------------------  September 25, 2021 10:55 AM. Documentation assistance provided by the Scribe. I was present during the time the encounter was recorded. The information recorded by the Scribe was done at my direction and has been reviewed and validated by me.  ----------------------------------------------------------------------------------------------------------------------

## 2021-09-20 NOTE — Unmapped (Signed)
Pt asked could you check her dextroamphetamine-amphetamine (ADDERALL) 20 mg tablet [[id number]] its normally Due on 3/2 not 3/10 pt stated seems that you have it mixed up with gabapentin (NEURONTIN) 250 mg/5 mL oral solution    Pt hasn't received it for this month

## 2021-09-21 DIAGNOSIS — F902 Attention-deficit hyperactivity disorder, combined type: Principal | ICD-10-CM

## 2021-09-21 MED ORDER — DEXTROAMPHETAMINE-AMPHETAMINE 20 MG TABLET
ORAL_TABLET | Freq: Two times a day (BID) | ORAL | 0 refills | 30 days
Start: 2021-09-21 — End: 2021-10-21

## 2021-09-21 MED FILL — PANTOPRAZOLE 20 MG TABLET,DELAYED RELEASE: ORAL | 30 days supply | Qty: 30 | Fill #1

## 2021-09-21 NOTE — Unmapped (Signed)
Addended by: Marylyn Ishihara on: 09/21/2021 04:45 AM     Modules accepted: Orders

## 2021-09-21 NOTE — Unmapped (Signed)
Medication Request     Patient Name: Vincent Waters   Caller: Self (Patient)  Have you contacted your pharmacy? yes      Last Visit: 08/19/2021       Medication Name: clonazePAM (KLONOPIN) 0.5 MG tablet [[id number]]   Dosage:  Route: Oral (PO)  Frequency: As Needed (PRN)  Day Supply Requested: 30  Pharmacy (Name & Address):   [facility]  [address], [place] [address]  Phone: [phone]  Fax: [phone]     Pt. Is questioning why he has a 7 day supply for the medication instead of a 30 day supply like previous. Pt. Is requesting for more day supply. Please advise.

## 2021-09-22 ENCOUNTER — Ambulatory Visit: Admit: 2021-09-22 | Discharge: 2021-09-23 | Payer: PRIVATE HEALTH INSURANCE

## 2021-09-22 ENCOUNTER — Institutional Professional Consult (permissible substitution): Admit: 2021-09-22 | Discharge: 2021-09-23 | Payer: PRIVATE HEALTH INSURANCE

## 2021-09-22 MED ORDER — DEXTROAMPHETAMINE-AMPHETAMINE 20 MG TABLET
ORAL_TABLET | Freq: Two times a day (BID) | ORAL | 0 refills | 30 days
Start: 2021-09-22 — End: 2021-10-22

## 2021-09-22 NOTE — Unmapped (Signed)
In-person nurse meeting with patient concerning medication refills. Patient does not want to wean off Klonopin. Pt will need a soon refill of Klonopin as the last refill was only 7 days worth. Pt will also need refill of Adderral to [facility] and liquid Gabapentin prescription.

## 2021-09-22 NOTE — Unmapped (Signed)
15

## 2021-09-23 ENCOUNTER — Ambulatory Visit: Admit: 2021-09-23 | Discharge: 2021-09-24 | Payer: PRIVATE HEALTH INSURANCE

## 2021-09-23 ENCOUNTER — Ambulatory Visit
Admit: 2021-09-23 | Discharge: 2021-09-24 | Payer: PRIVATE HEALTH INSURANCE | Attending: Specialist | Primary: Specialist

## 2021-09-23 DIAGNOSIS — F1320 Sedative, hypnotic or anxiolytic dependence, uncomplicated: Principal | ICD-10-CM

## 2021-09-23 DIAGNOSIS — G894 Chronic pain syndrome: Principal | ICD-10-CM

## 2021-09-23 DIAGNOSIS — Z01818 Encounter for other preprocedural examination: Principal | ICD-10-CM

## 2021-09-23 DIAGNOSIS — G540 Brachial plexus disorders: Principal | ICD-10-CM

## 2021-09-23 LAB — BENZODIAZEPINE, URINE, QUALITATIVE: BENZODIAZEPINE SCREEN, URINE: NEGATIVE

## 2021-09-23 LAB — TOXICOLOGY SCREEN, URINE
AMPHETAMINE SCREEN URINE: NEGATIVE
BARBITURATE SCREEN URINE: NEGATIVE
BENZODIAZEPINE SCREEN, URINE: NEGATIVE
BUPRENORPHINE, URINE SCREEN: NEGATIVE
CANNABINOID SCREEN URINE: NEGATIVE
COCAINE(METAB.)SCREEN, URINE: NEGATIVE
FENTANYL SCREEN, URINE: NEGATIVE
METHADONE SCREEN, URINE: NEGATIVE
OPIATE SCREEN URINE: NEGATIVE
OXYCODONE SCREEN URINE: POSITIVE — AB

## 2021-09-23 MED ORDER — GABAPENTIN 250 MG/5 ML ORAL SOLUTION
Freq: Three times a day (TID) | ORAL | 0 refills | 33 days
Start: 2021-09-23 — End: 2021-10-26

## 2021-09-23 NOTE — Unmapped (Addendum)
You can get labs and/or X-rays at [facility]  [address]  [place], [place] [address]    If you need any FMLA paperwork filled out please fax to [phone] - Attn Doroteo Glassman   Start PT the week of June 19th   Your surgery is scheduled for May 30th at [address] [place].    Marland Kitchen You will receive a call the day before your surgery with your exact arrival time.  [facility], your anesthesia team, will also contact you prior to your procedure.  You may be asked to go to our [facility] clinic for more labs or testing.  Lastly, our Patient Access Center will be  contacting you regarding COVID 19 testing (See information below).      . Do not eat or drink anything 8 hours before surgery.      . Do not take medications for diabetes the day of surgery.      Stop any medications containing ibuprofen 7 days prior to your surgery date.    Call the Vascular Surgery clinic [phone] (Monday-Friday 8am-3:00 pm) and ask to speak with a nurse if you have any questions.  If you have signs/symptoms of infection (fever, chills, night sweats, large amounts of drainage, severe pain) go to your closest Emergency Room.     If it is after hours or on the weekend, please call the hospital operator [phone] and ask to speak with the Vascular Surgeon on call.       COVID Screening  If you can provide proof of vaccination, you will still need a COVID-19 test.  Testing will be scheduled 24-72 hours prior to surgery. You will receive a call to schedule your test and with results of your screening prior to your surgery (also results will be accessible through myChart).  ??         The test is a swab with no requirements for fasting or other preparation needed.    Plan to be off work at least 2 weeks following surgery.  This may change depending on how your post-operative course goes.

## 2021-09-24 DIAGNOSIS — F411 Generalized anxiety disorder: Principal | ICD-10-CM

## 2021-09-24 MED ORDER — CLONAZEPAM 0.5 MG TABLET
ORAL_TABLET | Freq: Every day | ORAL | 0 refills | 7 days | PRN
Start: 2021-09-24 — End: 2021-10-01

## 2021-09-24 MED ORDER — DEXTROAMPHETAMINE-AMPHETAMINE 20 MG TABLET
ORAL_TABLET | Freq: Every day | ORAL | 0 refills | 10 days | Status: CP | PRN
Start: 2021-09-24 — End: 2021-10-24
  Filled 2021-09-29: qty 10, 10d supply, fill #0

## 2021-09-24 NOTE — Unmapped (Signed)
Addendum 09/24/21:    Sending in 1 month prescription of 10 pills of Adderall to [facility] per prior discussion with patient on 09/20/2021 (see note below). Additionally, sending month supply of gabapentin to [facility], due for fill on 09/27/2021. Awaiting results of benzodiazepine confirmation prior to sending in further Klonopin on top of the 7-day supply dispensed to patient from [facility] pharmacy at [place] on 09/20/2021, which should last patient through 09/26/2021.     - dextroamphetamine-amphetamine (ADDERALL) 20 mg tablet; Take 1 tablet (20 mg total) by mouth daily as needed.  Dispense: 10 tablet; Refill: 0  - gabapentin (NEURONTIN) 250 mg/5 mL oral solution; Take 10 mL (500 mg total) by mouth Three (3) times a day.  Dispense: 990 mL; Refill: 0    Alver Fisher, MD, MPH (he/him)  Resident Physician, PGY-2  Department of Family Medicine

## 2021-09-24 NOTE — Unmapped (Signed)
Addended by: Marylyn Ishihara on: 09/24/2021 04:54 AM     Modules accepted: Orders

## 2021-09-24 NOTE — Unmapped (Signed)
Nutrition Consult Note    Weight History:  Wt Readings from Last 6 Encounters:   09/23/21 70.3 kg (155 lb)   08/19/21 70.8 kg (156 lb)   08/19/21 70.9 kg (156 lb 6.4 oz)   07/22/21 71.2 kg (157 lb)   07/15/21 70.8 kg (156 lb)   06/24/21 70.8 kg (156 lb)       Relevant Medications, Herbs, Supplements:    Current Outpatient Medications:   ???  azelastine (ASTELIN) 137 mcg (0.1 %) nasal spray, Instill 2 sprays into each nostril two (2) times a day as needed for rhinitis., Disp: 30 mL, Rfl: 11  ???  budesonide-formoteroL (SYMBICORT) 160-4.5 mcg/actuation inhaler, Inhale 1 puff by mouth Two (2) times a day., Disp: 10.2 g, Rfl: 5  ???  cetirizine (ZYRTEC) 10 MG tablet, Take 1 tablet (10 mg total) by mouth Two (2) times a day., Disp: 30 tablet, Rfl: 12  ???  clindamycin (CLEOCIN T) 1 % lotion, , Disp: , Rfl:   ???  clonazePAM (KLONOPIN) 0.5 MG tablet, Take 1 tablet (0.5 mg total) by mouth daily as needed for anxiety for up to 7 days., Disp: 7 tablet, Rfl: 0  ???  cyclobenzaprine (FLEXERIL) 5 MG tablet, Take 1 tablet (5 mg total) by mouth three (3) times a day as needed for muscle spasms. (Patient not taking: Reported on 09/23/2021), Disp: 9 tablet, Rfl: 0  ???  dextroamphetamine-amphetamine (ADDERALL) 20 mg tablet, Take 1 tablet (20 mg total) by mouth daily as needed., Disp: 10 tablet, Rfl: 0  ???  diclofenac sodium (VOLTAREN) 1 % gel, Apply 2 g topically four (4) times a day., Disp: 100 g, Rfl: 0  ???  emtricitabine-tenofovir, TDF, (TRUVADA) 200-300 mg per tablet, Take 1 tablet by mouth daily., Disp: 30 tablet, Rfl: 11  ???  EPINEPHrine (EPIPEN) 0.3 mg/0.3 mL injection, Inject 0.3 mL (0.3 mg total) under the skin once for 1 dose., Disp: 1 each, Rfl: 1  ???  fluticasone propionate (FLONASE) 50 mcg/actuation nasal spray, Instill 2 sprays into each nostril two (2) times a day., Disp: 16 g, Rfl: 1  ???  [START ON 09/26/2021] gabapentin (NEURONTIN) 250 mg/5 mL oral solution, Take 10 mL (500 mg total) by mouth Three (3) times a day., Disp: 990 mL, Rfl: 0  ???  inhalational spacing device Spcr, Use as directed with albuterol and symbicort, Disp: 1 each, Rfl: 1  ???  needle, disp, 18 G 18 Olander x 1 1/2 Ndle, For drawing hormone for injection., Disp: 25 each, Rfl: 0  ???  needle, disp, 25 Zackery 25 Charleton x 5/8 Ndle, For subcutaneous hormone injection., Disp: 25 each, Rfl: 0  ???  nifedipine 0.3% lidocaine 1.5% in petrolatum ointment, Apply a pea-size amount to anal area 3 times a day, Disp: 100 g, Rfl: 0  ???  omalizumab (XOLAIR) 150 mg/mL syringe, Inject 2 mL (300 mg total) under the skin every twenty-eight (28) days., Disp: 2 mL, Rfl: 12  ???  oxyCODONE-acetaminophen (PERCOCET) 5-325 mg per tablet, Take 1 tablet by mouth two (2) times a day as needed for pain., Disp: 60 tablet, Rfl: 0  ???  pantoprazole (PROTONIX) 20 MG tablet, Take 1 tablet (20 mg total) by mouth daily., Disp: 30 tablet, Rfl: 1  ???  plecanatide (TRULANCE) 3 mg Tab, Take one tablet by mouth every day, Disp: 30 tablet, Rfl: 2  ???  syringe, disposable, 1 mL Syrg, For subcutaneous hormone injection., Disp: 25 each, Rfl: 0  ???  syringe, disposable, 2.5 mL Syrg,  Use weekly, Disp: 30 Syringe, Rfl: 2  ???  testosterone cypionate (DEPOTESTOTERONE CYPIONATE) 200 mg/mL injection, Inject 0.2 mL (40 mg total) under the skin every seven (7) days., Disp: 2 mL, Rfl: 0  ???  triamcinolone (KENALOG) 0.1 % cream, Apply topically Two (2) times a day., Disp: 15 g, Rfl: 0    Current Facility-Administered Medications:   ???  dexamethasone (DECADRON) 4 mg/mL injection 4 mg, 4 mg, Intra-articular, Once, Parke Poisson, MD  ???  omalizumab [name]) injection 300 mg, 300 mg, Subcutaneous, Q28 Days, [name], MD, 300 mg at 09/13/21 1125  ???  ropivacaine (NAROPIN) 5 mg/mL (0.5 %) injection 6 mL, 6 mL, Intra-articular, Once, Parke Poisson, MD  ???  triamcinolone acetonide (KENALOG-40) injection 40 mg, 40 mg, Intra-articular, Once, Parke Poisson, MD    Relevant Labs:  Hemoglobin A1C (%)   Date Value   08/03/2017 4.9      No components found for: LDLCALC   BP Readings from Last 3 Encounters:   09/23/21 113/77   09/10/21 131/102   09/08/21 120/78     Lab Results   Component Value Date    CHOL 187 01/22/2020     Lab Results   Component Value Date    HDL 47 01/22/2020     Lab Results   Component Value Date    LDL 123 (H) 01/22/2020     Lab Results   Component Value Date    VLDL 16.8 01/22/2020     Lab Results   Component Value Date    CHOLHDLRATIO 4.0 01/22/2020     Lab Results   Component Value Date    TRIG 84 01/22/2020     Lab Results   Component Value Date    VITDTOTAL 22.0 01/11/2021     Lab Results   Component Value Date    VITAMINB12 297 06/24/2021     24-Hour recall/usual intake:    Time Intake   Breakfast --   Snack (AM) --   Lunch - 1 pm Yesterday: Chinese - fried chicken wings, lo mein   Snack (PM) - 4 + 5 pm Yesterday: [employer] - 1 scoop snowball, 1 scoop ice cream  Yesterday: 2 pieces pound cake   Dinner-10 pm  Yesterday: Chinese - fried chicken wings   Snack (HS) --     Additional Pertinent Nutrition/Lifestyle-Related Information (baseline):  Alcohol intake: none  Food Insecurity: yes - receives $281 EBT monthly - patient states he would benefit from utilizing local food banks    Gastrointestinal: IBS-M  Constipation/diarrhea: constipation resolved with Trulance which causes diarrhea - 5-6 loose/watery stools daily  Bloating/distention: bloating at least once weekly - typically in the evening  Flatus: yes - malodorous  Abdominal pain: sharp - due to flatus   Steatorrhea: yes - stools typically floating, malodorous, occasionally yellow, foamy, greasy/oily stools, yellow greasy/oily film around the toilet rim; denies rectal pain;  Hematochezia/melena: denies - internal hemorrhoids  Nausea/vomiting: denies  Reflux: yes - exacerbated at night - taking pantoprazole once daily  Food allergies/intolerances/sensitivities: carrots, shellfish - anaphylaxis, rash    Patient stated reason(s) for nutrition changes: gastroparesis (diagnosed 2018); GERD  Other: Esophageal dilation 2021  Colonoscopy 2018  Taking Trulance once daily  cholecystectomy 2013/2014    Physical Activity: walking/remains active throughout the day   Barriers to physical activity: leg pain    Barriers:  Food Insecurity/Cost/Accessibility/Desire for Convenience and Common Cultural Cooking and Eating Practices/Lack of Social Cohesion    Estimated Daily  Needs:  1850-2500 calories (Harris-Benedict equation for BMI < 25, using actual body weight for weight maintenance)  55-70 g protein  2250 mL fluid (75 ounces) or per MD discretion    Nutrition Intervention:  Nutrition Counseling  Coordination of Nutrition Care - supportive listening; patient requests list of local food banks. CM and [facility]CC notified. Patient would also benefit from establishing with new GI specialist. PCP notified     Materials Provided:  None    Discuss at next outreach:   food and symptom diary    Nutrition Goals:  - fill out the food and symptom diary    Follow-up:  1 week    Tretha Sciara, RD, LDN, CDCES  Registered Dietitian Nutritionist   Certified Diabetes Care and Education Specialist

## 2021-09-24 NOTE — Unmapped (Signed)
Medication Request     Patient Name: Vincent Waters   Caller: Self (Patient)  Have you contacted your pharmacy? yes      Last Visit: 08/19/2021       Medication Name: clonazePAM (KLONOPIN) 0.5 MG tablet   Dosage:0.5 mg  Route: Oral (PO)  Frequency: Daily  Day Supply Requested: 30  Pharmacy (Name & Address): [facility]  Pharmacy [facility]  Pharmacy Phone Number: [phone]    *per pt did not receive the refill for this medication

## 2021-09-25 LAB — AMPHETAM,UR,CONF
AMPHET INTERP: NEGATIVE
AMPHETAMINE GC/MS CONF: NEGATIVE ng/mL
MDA: NEGATIVE ng/mL
MDMA GC/MS CONF: NEGATIVE ng/mL
METHAMPHETAMINE GC/MS URINE: NEGATIVE ng/mL
PHENTERMINE: NEGATIVE ng/mL
PSEUDOEPHEDRINE: NEGATIVE ng/mL

## 2021-09-26 MED ORDER — GABAPENTIN 250 MG/5 ML ORAL SOLUTION
Freq: Three times a day (TID) | ORAL | 0 refills | 33 days | Status: CP
Start: 2021-09-26 — End: 2021-10-29
  Filled 2021-09-29: qty 429, 14d supply, fill #0

## 2021-09-27 ENCOUNTER — Ambulatory Visit: Admit: 2021-09-27 | Discharge: 2021-09-28 | Payer: PRIVATE HEALTH INSURANCE

## 2021-09-27 ENCOUNTER — Ambulatory Visit
Admit: 2021-09-27 | Discharge: 2021-09-28 | Payer: PRIVATE HEALTH INSURANCE | Attending: Registered" | Primary: Registered"

## 2021-09-27 DIAGNOSIS — Z01818 Encounter for other preprocedural examination: Principal | ICD-10-CM

## 2021-09-27 DIAGNOSIS — F411 Generalized anxiety disorder: Principal | ICD-10-CM

## 2021-09-27 DIAGNOSIS — G540 Brachial plexus disorders: Principal | ICD-10-CM

## 2021-09-27 LAB — BASIC METABOLIC PANEL
ANION GAP: 6 mmol/L (ref 5–14)
BLOOD UREA NITROGEN: 8 mg/dL — ABNORMAL LOW (ref 9–23)
BUN / CREAT RATIO: 8
CALCIUM: 9.5 mg/dL (ref 8.7–10.4)
CHLORIDE: 105 mmol/L (ref 98–107)
CO2: 29 mmol/L (ref 20.0–31.0)
CREATININE: 0.95 mg/dL — ABNORMAL HIGH
EGFR CKD-EPI (2021) FEMALE: 78 mL/min/{1.73_m2} (ref >=60)
GLUCOSE RANDOM: 89 mg/dL (ref 70–179)
POTASSIUM: 3.7 mmol/L (ref 3.4–4.8)
SODIUM: 140 mmol/L (ref 135–145)

## 2021-09-27 LAB — CBC
HEMATOCRIT: 45.1 % (ref 36.0–46.0)
HEMOGLOBIN: 15 g/dL (ref 12.1–15.7)
MEAN CORPUSCULAR HEMOGLOBIN CONC: 33.4 g/dL (ref 32.0–36.0)
MEAN CORPUSCULAR HEMOGLOBIN: 27.5 pg (ref 25.9–32.4)
MEAN CORPUSCULAR VOLUME: 82.3 fL (ref 77.6–95.7)
MEAN PLATELET VOLUME: 7.3 fL (ref 6.8–10.7)
PLATELET COUNT: 326 10*9/L (ref 150–450)
RED BLOOD CELL COUNT: 5.48 10*12/L — ABNORMAL HIGH (ref 4.11–5.36)
RED CELL DISTRIBUTION WIDTH: 13.7 % (ref 12.2–15.2)
WBC ADJUSTED: 5.6 10*9/L (ref 3.6–11.2)

## 2021-09-27 MED ORDER — CLONAZEPAM 0.5 MG TABLET
ORAL_TABLET | Freq: Every day | ORAL | 0 refills | 7 days | Status: CP | PRN
Start: 2021-09-27 — End: 2021-10-04
  Filled 2021-09-29: qty 7, 7d supply, fill #0

## 2021-09-28 NOTE — Unmapped (Signed)
Addended by: Marylyn Ishihara on: 09/28/2021 01:32 AM     Modules accepted: Orders

## 2021-09-28 NOTE — Unmapped (Signed)
Addended by: Marylyn Ishihara on: 09/27/2021 10:19 PM     Modules accepted: Orders

## 2021-09-28 NOTE — Unmapped (Signed)
Addended by: Marylyn Ishihara on: 09/28/2021 01:18 AM     Modules accepted: Orders

## 2021-09-29 LAB — BENZODIAZEPINES, CONF, URINE
2-HYDROXY ETHYL FLURAZEPAM BY MS CONFIRM: NEGATIVE ng/mL
ALPHA-HYDROXY MIDAZOLAM BY MS CONFIRM: NEGATIVE ng/mL
ALPRAZOLAM BY MS CONFIRM: NEGATIVE ng/mL
BENZODIAZEPINES INTERPRETATION: POSITIVE
CHLORDIAZEPOXIDE BY MS CONFIRM: NEGATIVE ng/mL
CLOBAZAM BY MS CONFIRM: NEGATIVE ng/mL
CLONAZEPAM BY MS CONFIRM: NEGATIVE ng/mL
DIAZEPAM BY MS CONFIRM: NEGATIVE ng/mL
FLUNITRAZEPAM BY MS CONFIRM: NEGATIVE ng/mL
FLURAZEPAM BY MS CONFIRM: NEGATIVE ng/mL
MIDAZOLAM BY MS CONFIRM: NEGATIVE ng/mL
N-DESMETHYLCLOBAZAM BY MS CONFIRM: NEGATIVE ng/mL
NORDIAZEPAM-BY GC/MS: NEGATIVE ng/mL
PRAZEPAM BY MS CONFIRM: NEGATIVE ng/mL
TRIAZOLAM BY MS CONFIRM: NEGATIVE ng/mL
ZOLPIDEM BY MS CONFIRM: NEGATIVE ng/mL
ZOLPIDEM PHENYL-4-CARBOXYLIC ACID BY MS CONFIRM: NEGATIVE ng/mL

## 2021-10-01 NOTE — Unmapped (Unsigned)
Nutrition Consult Note    Weight History:  Wt Readings from Last 6 Encounters:   09/23/21 70.3 kg (155 lb)   08/19/21 70.8 kg (156 lb)   08/19/21 70.9 kg (156 lb 6.4 oz)   07/22/21 71.2 kg (157 lb)   07/15/21 70.8 kg (156 lb)   06/24/21 70.8 kg (156 lb)       Relevant Medications, Herbs, Supplements:    Current Outpatient Medications:   ???  azelastine (ASTELIN) 137 mcg (0.1 %) nasal spray, Instill 2 sprays into each nostril two (2) times a day as needed for rhinitis., Disp: 30 mL, Rfl: 11  ???  budesonide-formoteroL (SYMBICORT) 160-4.5 mcg/actuation inhaler, Inhale 1 puff by mouth Two (2) times a day., Disp: 10.2 g, Rfl: 5  ???  cetirizine (ZYRTEC) 10 MG tablet, Take 1 tablet (10 mg total) by mouth Two (2) times a day., Disp: 30 tablet, Rfl: 12  ???  clindamycin (CLEOCIN T) 1 % lotion, , Disp: , Rfl:   ???  clonazePAM (KLONOPIN) 0.5 MG tablet, Take 1 tablet (0.5 mg total) by mouth daily as needed for anxiety for up to 7 days., Disp: 7 tablet, Rfl: 0  ???  cyclobenzaprine (FLEXERIL) 5 MG tablet, Take 1 tablet (5 mg total) by mouth three (3) times a day as needed for muscle spasms. (Patient not taking: Reported on 09/23/2021), Disp: 9 tablet, Rfl: 0  ???  dextroamphetamine-amphetamine (ADDERALL) 20 mg tablet, Take 1 tablet (20 mg total) by mouth daily as needed., Disp: 10 tablet, Rfl: 0  ???  diclofenac sodium (VOLTAREN) 1 % gel, Apply 2 g topically four (4) times a day., Disp: 100 g, Rfl: 0  ???  emtricitabine-tenofovir, TDF, (TRUVADA) 200-300 mg per tablet, Take 1 tablet by mouth daily., Disp: 30 tablet, Rfl: 11  ???  EPINEPHrine (EPIPEN) 0.3 mg/0.3 mL injection, Inject 0.3 mL (0.3 mg total) under the skin once for 1 dose., Disp: 1 each, Rfl: 1  ???  fluticasone propionate (FLONASE) 50 mcg/actuation nasal spray, Instill 2 sprays into each nostril two (2) times a day., Disp: 16 g, Rfl: 1  ???  gabapentin (NEURONTIN) 250 mg/5 mL oral solution, Take 10 mL (500 mg total) by mouth Three (3) times a day., Disp: 990 mL, Rfl: 0  ???  inhalational spacing device Spcr, Use as directed with albuterol and symbicort, Disp: 1 each, Rfl: 1  ???  needle, disp, 18 G 18 Taegen x 1 1/2 Ndle, For drawing hormone for injection., Disp: 25 each, Rfl: 0  ???  needle, disp, 25 Alden 25 Dreon x 5/8 Ndle, For subcutaneous hormone injection., Disp: 25 each, Rfl: 0  ???  nifedipine 0.3% lidocaine 1.5% in petrolatum ointment, Apply a pea-size amount to anal area 3 times a day, Disp: 100 g, Rfl: 0  ???  omalizumab (XOLAIR) 150 mg/mL syringe, Inject 2 mL (300 mg total) under the skin every twenty-eight (28) days., Disp: 2 mL, Rfl: 12  ???  oxyCODONE-acetaminophen (PERCOCET) 5-325 mg per tablet, Take 1 tablet by mouth two (2) times a day as needed for pain., Disp: 60 tablet, Rfl: 0  ???  pantoprazole (PROTONIX) 20 MG tablet, Take 1 tablet (20 mg total) by mouth daily., Disp: 30 tablet, Rfl: 1  ???  plecanatide (TRULANCE) 3 mg Tab, Take one tablet by mouth every day, Disp: 30 tablet, Rfl: 2  ???  syringe, disposable, 1 mL Syrg, For subcutaneous hormone injection., Disp: 25 each, Rfl: 0  ???  syringe, disposable, 2.5 mL Syrg, Use weekly, Disp:  30 Syringe, Rfl: 2  ???  testosterone cypionate (DEPOTESTOTERONE CYPIONATE) 200 mg/mL injection, Inject 0.2 mL (40 mg total) under the skin every seven (7) days., Disp: 2 mL, Rfl: 0  ???  triamcinolone (KENALOG) 0.1 % cream, Apply topically Two (2) times a day., Disp: 15 g, Rfl: 0    Current Facility-Administered Medications:   ???  dexamethasone (DECADRON) 4 mg/mL injection 4 mg, 4 mg, Intra-articular, Once, Parke Poisson, MD  ???  omalizumab [name]) injection 300 mg, 300 mg, Subcutaneous, Q28 Days, [name], MD, 300 mg at 09/13/21 1125  ???  ropivacaine (NAROPIN) 5 mg/mL (0.5 %) injection 6 mL, 6 mL, Intra-articular, Once, Parke Poisson, MD  ???  triamcinolone acetonide (KENALOG-40) injection 40 mg, 40 mg, Intra-articular, Once, Parke Poisson, MD    Relevant Labs:  Hemoglobin A1C (%)   Date Value   08/03/2017 4.9      No components found for: LDLCALC   BP Readings from Last 3 Encounters:   09/23/21 113/77   09/10/21 131/102   09/08/21 120/78     Lab Results   Component Value Date    CHOL 187 01/22/2020     Lab Results   Component Value Date    HDL 47 01/22/2020     Lab Results   Component Value Date    LDL 123 (H) 01/22/2020     Lab Results   Component Value Date    VLDL 16.8 01/22/2020     Lab Results   Component Value Date    CHOLHDLRATIO 4.0 01/22/2020     Lab Results   Component Value Date    TRIG 84 01/22/2020     Lab Results   Component Value Date    VITDTOTAL 22.0 01/11/2021     Lab Results   Component Value Date    VITAMINB12 297 06/24/2021     24-Hour recall/usual intake:    Time Intake   Breakfast ***   Snack (AM) ***   Lunch ***   Snack (PM) ***   Dinner ***   Snack (HS) ***       Other Nutrition Information:  Beverages: ***  Water: ***  Snacks: ***  Dining Out: ***  Gastrointestinal: ***  Edema: ***  Other: ***    Additional Pertinent Nutrition/Lifestyle-Related Information (baseline):  Alcohol intake: none  Food Insecurity: yes - receives $281 EBT monthly - patient states he would benefit from utilizing local food banks    Gastrointestinal: IBS-M  Constipation/diarrhea: constipation resolved with Trulance which causes diarrhea - 5-6 loose/watery stools daily  Bloating/distention: bloating at least once weekly - typically in the evening  Flatus: yes - malodorous  Abdominal pain: sharp - due to flatus   Steatorrhea: yes - stools typically floating, malodorous, occasionally yellow, foamy, greasy/oily stools, yellow greasy/oily film around the toilet rim; denies rectal pain;  Hematochezia/melena: denies - internal hemorrhoids  Nausea/vomiting: denies  Reflux: yes - exacerbated at night - taking pantoprazole once daily  Food allergies/intolerances/sensitivities: carrots, shellfish - anaphylaxis, rash    Patient stated reason(s) for nutrition changes: gastroparesis (diagnosed 2018); GERD  Other: Esophageal dilation 2021  Colonoscopy 2018  Taking Trulance once daily  cholecystectomy 2013/2014    Physical Activity: walking/remains active throughout the day ***  Barriers to physical activity: leg pain    Barriers:  Food Insecurity/Cost/Accessibility/Desire for Convenience and Common Cultural Cooking and Eating Practices/Lack of Social Cohesion    Estimated Daily Needs:  1850-2500 calories (Harris-Benedict equation for BMI < 25,  using actual body weight for weight maintenance)  55-70 g protein  2250 mL fluid (75 ounces) or per MD discretion    Nutrition Intervention:  Nutrition Counseling  Coordination of Nutrition Care - supportive listening; patient requests list of local food banks. CM and [facility] notified. Patient would also benefit from establishing with new GI specialist. PCP notified ***    Materials Provided:  None***  food and symptom diary***    Discuss at next outreach:   ***    Nutrition Goals:  - fill out the food and symptom diary***    Follow-up:  1 week***    Tretha Sciara, RD, LDN, CDCES  Registered Dietitian Nutritionist   Certified Diabetes Care and Education Specialist

## 2021-10-04 MED ORDER — CLONAZEPAM 0.5 MG TABLET
ORAL_TABLET | Freq: Every day | ORAL | 0 refills | 30 days | Status: CP | PRN
Start: 2021-10-04 — End: 2021-11-03
  Filled 2021-10-06: qty 30, 30d supply, fill #0

## 2021-10-04 NOTE — Unmapped (Signed)
Complex Case Management  SUMMARY NOTE    Attempted to contact pt today at Cell number for nutrition follow up. Left message to return call.; 1st attempt    Discuss at next visit: Nutrition Follow up       Latravia Southgate, RD, LDN, CDCES  Registered Dietitian Nutritionist  Certified Diabetes Care and Education Specialist

## 2021-10-05 NOTE — Unmapped (Signed)
[facility] Administered Medication Refill Coordination Note      NAME:[facility] DOB: 1981/03/15      Medication: Xolair    Day Supply: 28 days      SHIPPING      Next delivery from [facility] [phone]) to [facility] for [facility] is scheduled for n/a.    Clinic contact: [name]    Patient's next nurse visit for administration: n/a.clinic denied refill @ this time advised will f/u in 3 weeks    We will follow up with clinic monthly for standard refill processing and delivery.      Carrye Goller Samella Parr  Specialty Pharmacy Technician

## 2021-10-05 NOTE — Unmapped (Signed)
Sending 30-day prescription for clonazepam 0.5 mg daily PRN given PDMP with appropriate refills and benzo confirmation positive.    Alver Fisher, MD, MPH (he/him)  Resident Physician, PGY-2  Department of [facility] Medicine

## 2021-10-05 NOTE — Unmapped (Signed)
Addended by: Marylyn Ishihara on: 10/04/2021 08:27 PM     Modules accepted: Orders

## 2021-10-06 NOTE — Unmapped (Signed)
Complex Case Management  SUMMARY NOTE    Attempted to contact pt today at Cell number to follow up for Complex Case Management services. Left message to return call.; 1st attempt    Discuss at next visit: RD Referral

## 2021-10-15 ENCOUNTER — Ambulatory Visit: Admit: 2021-10-15 | Discharge: 2021-10-16 | Payer: PRIVATE HEALTH INSURANCE

## 2021-10-15 LAB — TOXICOLOGY SCREEN, URINE
AMPHETAMINE SCREEN URINE: NEGATIVE
BARBITURATE SCREEN URINE: NEGATIVE
BENZODIAZEPINE SCREEN, URINE: NEGATIVE
BUPRENORPHINE, URINE SCREEN: NEGATIVE
CANNABINOID SCREEN URINE: NEGATIVE
COCAINE(METAB.)SCREEN, URINE: NEGATIVE
FENTANYL SCREEN, URINE: NEGATIVE
METHADONE SCREEN, URINE: NEGATIVE
OPIATE SCREEN URINE: NEGATIVE
OXYCODONE SCREEN URINE: NEGATIVE

## 2021-10-15 MED ORDER — OXYCODONE-ACETAMINOPHEN 5 MG-325 MG TABLET
ORAL_TABLET | Freq: Two times a day (BID) | ORAL | 0 refills | 26 days | Status: CP | PRN
Start: 2021-10-15 — End: 2021-11-10
  Filled 2021-10-15: qty 52, 26d supply, fill #0

## 2021-10-15 MED ORDER — FAMOTIDINE 40 MG TABLET
ORAL_TABLET | Freq: Every evening | ORAL | 1 refills | 30 days | Status: CP
Start: 2021-10-15 — End: 2022-10-15
  Filled 2021-10-15: qty 30, 30d supply, fill #0

## 2021-10-15 MED FILL — GABAPENTIN 250 MG/5 ML ORAL SOLUTION: ORAL | 19 days supply | Qty: 561 | Fill #1

## 2021-10-15 NOTE — Unmapped (Signed)
Hi Vincent Waters,    It was great to see you today. I look forward to seeing you at your next visit. In the meantime, for non-urgent questions, please message our clinic on MyChart. I am typically able to respond to these messages within 3-5 business days. For more urgent matters, please call our clinic at [phone]. In the event of a medical emergency, call 911 and/or visit our urgent care or an emergency room.     Take care,    Claudie Revering, MD, MPH   He, him, his  Resident Physician, PGY-2    [facility]   [address]   [place], [place] [address]   [phone] (Phone); [phone] (Fax)     [facility] MyChart is for non-urgent messages. I do my best to respond within 3 business days, however occasionally it may take longer. If you have immediate concerns, contact our clinic by phone [phone].      [facility] has an Urgent Care!  Family Medicine Urgent Care Hours: Monday-Friday 7am-8pm; Saturday and Sunday 12pm-5pm   If you think you are having an emergency, please call 911 or go to your nearest emergency department.

## 2021-10-15 NOTE — Unmapped (Signed)
Addended by: Marylyn Ishihara on: 10/15/2021 03:39 PM     Modules accepted: Orders

## 2021-10-15 NOTE — Unmapped (Addendum)
[facility]- [facility]  Established Patient Clinic Note    Assessment/Plan:   [name] is a 41 y.o.adult who presents for follow-up:     Problem List Items Addressed This Visit        Digestive    Gastroesophageal reflux disease    Relevant Medications    famotidine (PEPCID) 40 MG tablet       Other    Attention deficit hyperactivity disorder (ADHD), combined type    Relevant Orders    Toxicology Screen, Urine (Completed)    Amphetamine, Urine Confirmation    Generalized anxiety disorder    Relevant Orders    Toxicology Screen, Urine (Completed)    Benzodiazepines, Conf, Urine    Chronic pain syndrome - Primary    Relevant Medications    oxyCODONE-acetaminophen (PERCOCET) 5-325 mg per tablet    Other Relevant Orders    Toxicology Screen, Urine (Completed)    Opiate Confirmation, Urine   Other Visit Diagnoses     Steatorrhea        Relevant Orders    Ambulatory referral to Gastroenterology    Pancreatic Elastase, Fecal        Attending: Dr. Ralene Bathe    # Chronic pain syndrome - ADHD - GAD  PDMP shows appropriate refills, Utox pan-negative today, awaiting opiate, amphetamine and benzo confirmations (previously confirmations have been positive despite negative utox).  Patient requests early refill of Percocet as he is leaving today to be out of town through at least May 15th, possibly longer.  Last refilled Percocet on 4/3 for 30-day supply, currently has 4 days of Percocet left in his supply.  Will provide the balance of a 30-day supply (26 days, i.e. 52 tablets). Recently referred to controlled med board given multiple controlled substances. Will continue readdressing need for these medications, particularly benzos for anxiety, as pt eventually desires to taper off of benzos altogether. Pt also verbalizes understanding that his constipation is likely due in part to longstanding opioid use. Will await opioid/benzo/amphetamine confirmations prior to sending refills. Controlled substance agreement re-signed today.  - Toxicology Screen, Urine  - Opiate Confirmation, Urine  - Amphetamine, Urine Confirmation  - Benzodiazepines, Conf, Urine  - oxyCODONE-acetaminophen (PERCOCET) 5-325 mg per tablet; Take 1 tablet by mouth two (2) times a day as needed for pain for up to 26 days.  Dispense: 52 tablet; Refill: 0    # GERD - steatorrhea - constipation - possible non-diabetic gastroparesis  Seeing nutritionist, enjoying this. Nutritionist notes steatorrhea, recommends fecal elastase + bacterial stool panel; insurance likely won't approve GIPP given no travel recently, however will order fecal elastase. Requests [facility] referral as prior GI Dr is retiring. Additionally, notes increased GERD symptoms with protonix 20 daily; after discussion of options, will add famotidine 40 qPM, as ideally it would be best to wean off of protonix in the long term. Also recommend daily miralax for continued constipation; pt aware that this likely will persist with chronic opioid use as well, and hopes to de-escalate opioid use in the future.   - Ambulatory referral to Gastroenterology  - Daily miralax  - Continue protonix 20 daily; will add famotidine 40 qPM  - Pancreatic Elastase, Fecal  - famotidine (PEPCID) 40 MG tablet; Take 1 tablet (40 mg total) by mouth every evening.  Dispense: 30 tablet; Refill: 1    # PrEP use  Not currently taking PrEP as he has only recently been sexually active with his ex-girlfriend, whose recent STI testing was negative.  Offered regular STI testing today, however he declines. Will readdress at f/u visit. Discussed safer sex practices.   - Readdress at f/u visit    HEALTH MAINTENANCE ITEMS STILL DUE:  Health Maintenance Due   Topic Date Due   ??? COVID-19 Vaccine (5 - Booster for Pfizer series) 05/15/2021     Follow-up: No follow-ups on file.    Future Appointments   Date Time Provider Department Center   10/19/2021  9:00 AM Neftali Faribault, PsyD [facility]   10/22/2021  8:00 AM Kern Reap, MD [facility]   11/23/2021  9:30 AM Mena Goes, PsyD [facility]   11/23/2021  2:15 PM Kern Reap, MD [facility]   12/01/2021 10:15 AM Rayetta Pigg, [name] [facility]   12/30/2021  9:45 AM Thora Lance, MD [facility]       Subjective   Vincent Waters is a 41 y.o. adult  coming to clinic today for the following issues:    Chief Complaint   Patient presents with   ??? Follow-up     Med    ??? Knee Pain     Right knee pain     HPI: see above for more information.     I have reviewed the problem list, medications, and allergies and have updated/reconciled them if needed.    [name]  reports that he has never smoked. He has never used smokeless tobacco.  Health Maintenance   Topic Date Due   ??? COVID-19 Vaccine (5 - Booster for Pfizer series) 05/15/2021   ??? DTaP/Tdap/Td Vaccines (2 - Td or Tdap) 08/19/2027   ??? Pneumococcal Vaccine 0-64  Completed   ??? Hepatitis C Screen  Completed   ??? Influenza Vaccine  Completed       Objective     VITALS: BP 109/74  - Pulse 82  - Temp 36.4 ??C (97.6 ??F) (Temporal)  - Ht 162.6 cm (5' 4)  - Wt 74.8 kg (165 lb)  - LMP 07/20/2017 (Exact Date)  - BMI 28.32 kg/m??     Physical Exam  General: well-appearing, sitting upright in no acute distress  Head: Normocephalic, atraumatic  ENT: No dental trauma noted.   Eyes: conjunctiva normal, non-erythematous, non-icteric, no discharge.  Neck: no thyroid enlargement or masses  Lungs: No increased work of breathing or audible wheezing  Skin: Warm, dry, no erythema or rash on exposed skin  Musculoskeletal: No visible gait abnormalities  Neurologic: Alert & oriented x 3, no gross sensorimotor abnormalities  Psychiatric: Pleasant, cooperative, good eye contact, appropriate thought processes    Wt Readings from Last 3 Encounters:   10/15/21 74.8 kg (165 lb)   09/23/21 70.3 kg (155 lb)   08/19/21 70.8 kg (156 lb)      PHQ-9 PHQ-9 TOTAL SCORE   10/22/2020   9:00 AM 6   09/04/2020   9:00 AM 3   08/21/2020 9:00 AM 5   05/22/2020   3:00 PM 9     LABS/IMAGING  I have reviewed pertinent recent labs and imaging in Epic    Alver Fisher, MD, MPH (he/him)  Resident Physician, PGY-2  Department of Family Medicine      [facility]  [place] [facility] [place] [facility]  [address], [place], [place] [address] ??? Telephone [phone] ??? Fax [phone]  [email or web address]

## 2021-10-18 NOTE — Unmapped (Signed)
Complex Case Management  SUMMARY NOTE    Attempted to contact pt today at Cell number to follow up for Complex Case Management services. Left message to return call.; 2nd attempt, letter sent.    Discuss at next visit: RD Referral

## 2021-10-18 NOTE — Unmapped (Signed)
I was immediately available via phone/pager or present on site.  I reviewed and discussed the case with the resident, but did not see the patient.  I agree with the assessment and plan as documented in the resident's note. Ellery Plunk, MD

## 2021-10-19 ENCOUNTER — Ambulatory Visit: Admit: 2021-10-19 | Payer: PRIVATE HEALTH INSURANCE | Attending: Clinical | Primary: Clinical

## 2021-10-19 LAB — OPIATE, URINE, QUANTITATIVE
6-MONOACETYLMRPH: <5 ng/mL (ref <5)
BUPRENORPHINE: <5 ng/mL (ref <5)
CODEINE GC/MS CONF: <25 ng/mL (ref <25)
FENTANYL, URINE GC/MS: <0.5 ng/mL (ref <0.5)
HYDROCODONE GC/MS CONF: <25 ng/mL (ref <25)
HYDROMORPHONE GC/MS CONF: <25 ng/mL (ref <25)
MORPHINE GC/MS CONF: <25 ng/mL (ref <25)
NORBUPRENORPHINE: <5 ng/mL (ref <5)
NORFENTANYL, UR GC/MS: <1 ng/mL (ref <1)
OPIATE INTERP: POSITIVE
OXYCODONE (GC/MS): 70 ng/mL — ABNORMAL HIGH (ref <25)
OXYMORPHONE: 122 ng/mL — ABNORMAL HIGH (ref <25)

## 2021-10-20 DIAGNOSIS — K219 Gastro-esophageal reflux disease without esophagitis: Principal | ICD-10-CM

## 2021-10-20 LAB — AMPHETAM,UR,CONF
AMPHET INTERP: NEGATIVE
AMPHETAMINE GC/MS CONF: NEGATIVE ng/mL
MDA: NEGATIVE ng/mL
MDMA GC/MS CONF: NEGATIVE ng/mL
METHAMPHETAMINE GC/MS URINE: NEGATIVE ng/mL
PHENTERMINE: NEGATIVE ng/mL
PSEUDOEPHEDRINE: NEGATIVE ng/mL

## 2021-10-20 MED ORDER — PANTOPRAZOLE 20 MG TABLET,DELAYED RELEASE
ORAL_TABLET | Freq: Every day | ORAL | 1 refills | 30 days
Start: 2021-10-20 — End: 2022-10-20

## 2021-10-21 LAB — BENZODIAZEPINES, CONF, URINE
2-HYDROXY ETHYL FLURAZEPAM BY MS CONFIRM: NEGATIVE ng/mL
ALPHA-HYDROXY MIDAZOLAM BY MS CONFIRM: NEGATIVE ng/mL
ALPRAZOLAM BY MS CONFIRM: NEGATIVE ng/mL
BENZODIAZEPINES INTERPRETATION: POSITIVE
CHLORDIAZEPOXIDE BY MS CONFIRM: NEGATIVE ng/mL
CLOBAZAM BY MS CONFIRM: NEGATIVE ng/mL
CLONAZEPAM BY MS CONFIRM: NEGATIVE ng/mL
DIAZEPAM BY MS CONFIRM: NEGATIVE ng/mL
FLUNITRAZEPAM BY MS CONFIRM: NEGATIVE ng/mL
FLURAZEPAM BY MS CONFIRM: NEGATIVE ng/mL
MIDAZOLAM BY MS CONFIRM: NEGATIVE ng/mL
N-DESMETHYLCLOBAZAM BY MS CONFIRM: NEGATIVE ng/mL
PRAZEPAM BY MS CONFIRM: NEGATIVE ng/mL
TEMAZEPAM-BY GC/MS: NEGATIVE ng/mL
TRIAZOLAM BY MS CONFIRM: NEGATIVE ng/mL
ZOLPIDEM BY MS CONFIRM: NEGATIVE ng/mL
ZOLPIDEM PHENYL-4-CARBOXYLIC ACID BY MS CONFIRM: NEGATIVE ng/mL

## 2021-10-21 MED ORDER — PANTOPRAZOLE 20 MG TABLET,DELAYED RELEASE
ORAL_TABLET | Freq: Every day | ORAL | 1 refills | 30 days | Status: CP
Start: 2021-10-21 — End: 2022-10-21
  Filled 2021-10-27: qty 30, 30d supply, fill #0

## 2021-10-22 ENCOUNTER — Institutional Professional Consult (permissible substitution): Admit: 2021-10-22 | Discharge: 2021-10-23 | Payer: PRIVATE HEALTH INSURANCE

## 2021-10-22 DIAGNOSIS — L501 Idiopathic urticaria: Principal | ICD-10-CM

## 2021-10-22 MED ADMIN — omalizumab (XOLAIR) injection 300 mg: 300 mg | SUBCUTANEOUS | @ 13:00:00 | Stop: 2022-04-05

## 2021-10-22 NOTE — Unmapped (Signed)
[facility] Administered Medication Refill Coordination Note      NAME:[facility] DOB: 09/01/1980      Medication: Xolair    Day Supply: 28 days      SHIPPING      Next delivery from [facility] [phone]) to [facility] for [facility] is scheduled for 05/18.    Clinic contact: Teodoro Kil    Patient's next nurse visit for administration: 06/02.    We will follow up with clinic monthly for standard refill processing and delivery.      Sidnie Swalley Samella Parr  Specialty Pharmacy Technician

## 2021-10-22 NOTE — Unmapped (Signed)
Complex Case Management  SUMMARY NOTE    Attempted to contact pt today at Cell number to follow up for Complex Case Management services. Left message to return call.; 2nd attempt, letter sent.    Discuss at next visit: Graduation from Complex Case Management

## 2021-10-22 NOTE — Unmapped (Signed)
Patient in for Xolair injections today. Received Xolair 300 mg subcutaneously, tolerated injections well. No other complaints at this time.      Specialty.    Patient is having surgery at the end of the month and not sure when he will be out of the hospital.  Patient will call to make next appointment once he is out of the hospital.

## 2021-10-25 NOTE — Unmapped (Signed)
Complex Case Management  SUMMARY NOTE    Attempted to contact pt today at Cell number to resolve Complex Case Management services. Left message to return call.; 1st attempt    Discuss at next visit: Graduation from Complex Case Management

## 2021-10-28 NOTE — Unmapped (Signed)
#  [facility] Referral # [id number]   Reason for Consult: Treatment Plan: PCP would like recommendations on current treatment plan to modify it to comply with policy. or Concerns regarding aberrant behaviors  Response: Modifications to current treatment plan recommended.  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 Significant comorbidities and risks exist, including psychiatric illness, ADHD illicit substance use, benzodiazepine dependence and gender dysphoria. He has failed multiple non-opioid therapies, including PT, behavioral therapy and psychoactive medications, although it is not clear that patient had adequate therapeutic trials or follow-through with many of these. Of note, records indicate a h/o bipolar disorder with psychosis requiring hospitalization in [place] on mood stabilizers, antipsychotics and lithium (between ~2006 to 2008), now compounded by symptoms of anxiety, ADHD and substance use disorder. Provider has done an excellent job with patient communication and follow-up, as well as with including ancillary and multidisciplinary teams to assist in management; but barriers exist to adherence with appointments and proposed alt/ancillary treatments.  Specific Recommendations:   - Continue to wean him off Klonopin, given contraindication and risk associated with concurrent opioid use. A very slow wean is recommended given possible h/o withdrawal in past.  - Prioritize psychiatric consultation for accurate diagnosis and appropriate treatment recommendations for comorbid GAD, BPD and ADHD. (Of note, appt was cancelled 10/18/21 and rescheduled to 6//23.)  - Percocet as currently prescribed, with frequent monitoring to mitigate risk.  - Continue with close in-person follow-up Q 3 mos and UDS Q 6 mos.  - Unless otherwise instructed by psychiatry, final priority would be to wean off Adderall as current prn use may contribute more to mood lability than it is helping with ADHD  - Provide rx for Naloxone and ensure pt and family educated on Naloxone use (of note, rx is not currently on patient???s active med list)  Thank you for your referral,  [facility] Team

## 2021-10-29 NOTE — Unmapped (Signed)
Complex Case Management  SUMMARY NOTE    Attempted to contact pt today at Cell number to resolve Complex Case Management services. Left message to return call.; 2nd attempt, letter sent.    Discuss at next visit: No answer, letter sent    This patient has been withdrawn from Complex Case Management services on 10/29/2021. Patient was unable to be contacted after enrollment. To have this patient reevaluated for Complex Case Management please send an AMB Referral to Case Management to the Personal Health Advocate Department and obtain best contact information.

## 2021-11-02 DIAGNOSIS — F411 Generalized anxiety disorder: Principal | ICD-10-CM

## 2021-11-03 MED FILL — CLONAZEPAM 0.5 MG TABLET: ORAL | 30 days supply | Qty: 30 | Fill #0

## 2021-11-03 MED FILL — XOLAIR 150 MG/ML SUBCUTANEOUS SYRINGE: SUBCUTANEOUS | 28 days supply | Qty: 2 | Fill #5

## 2021-11-05 MED ORDER — CLONAZEPAM 0.5 MG TABLET
ORAL_TABLET | Freq: Every day | ORAL | 0 refills | 30 days | Status: CP | PRN
Start: 2021-11-05 — End: 2021-12-05

## 2021-11-08 ENCOUNTER — Ambulatory Visit: Admit: 2021-11-08 | Discharge: 2021-11-09 | Payer: PRIVATE HEALTH INSURANCE

## 2021-11-08 MED ORDER — AZELASTINE 137 MCG (0.1 %) NASAL SPRAY AEROSOL
Freq: Two times a day (BID) | NASAL | 0 refills | 365 days | Status: CP | PRN
Start: 2021-11-08 — End: 2022-11-08
  Filled 2021-11-12: qty 30, 25d supply, fill #0

## 2021-11-08 MED ORDER — FAMOTIDINE 40 MG TABLET
ORAL_TABLET | Freq: Every evening | ORAL | 1 refills | 30 days | Status: CP
Start: 2021-11-08 — End: 2022-11-08

## 2021-11-08 MED ORDER — CETIRIZINE 10 MG TABLET
ORAL_TABLET | Freq: Two times a day (BID) | ORAL | 12 refills | 15 days | Status: CP
Start: 2021-11-08 — End: ?

## 2021-11-08 MED ORDER — BUDESONIDE-FORMOTEROL HFA 160 MCG-4.5 MCG/ACTUATION AEROSOL INHALER
Freq: Two times a day (BID) | RESPIRATORY_TRACT | 5 refills | 61 days | Status: CP
Start: 2021-11-08 — End: 2022-11-08
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
## 2021-11-09 DIAGNOSIS — Z789 Other specified health status: Principal | ICD-10-CM

## 2021-11-09 DIAGNOSIS — IMO0001 Endocrine disorder in female-to-male transgender person: Principal | ICD-10-CM

## 2021-11-09 DIAGNOSIS — F902 Attention-deficit hyperactivity disorder, combined type: Principal | ICD-10-CM

## 2021-11-09 DIAGNOSIS — G894 Chronic pain syndrome: Principal | ICD-10-CM

## 2021-11-09 MED ORDER — OXYCODONE-ACETAMINOPHEN 5 MG-325 MG TABLET
ORAL_TABLET | Freq: Two times a day (BID) | ORAL | 0 refills | 26 days | PRN
Start: 2021-11-09 — End: 2021-12-05

## 2021-11-09 MED ORDER — BD REGULAR BEVEL NEEDLES 25 GAUGE X 5/8"
0 refills | 0 days
Start: 2021-11-09 — End: ?

## 2021-11-09 MED ORDER — DEXTROAMPHETAMINE-AMPHETAMINE 20 MG TABLET
ORAL_TABLET | Freq: Every day | ORAL | 0 refills | 10 days | PRN
Start: 2021-11-09 — End: 2021-12-09

## 2021-11-09 MED ORDER — TESTOSTERONE CYPIONATE 200 MG/ML INTRAMUSCULAR OIL
SUBCUTANEOUS | 0 refills | 70 days
Start: 2021-11-09 — End: 2022-01-18

## 2021-11-09 MED ORDER — GABAPENTIN 250 MG/5 ML ORAL SOLUTION
Freq: Three times a day (TID) | ORAL | 0 refills | 33 days | Status: CN
Start: 2021-11-09 — End: 2021-12-12

## 2021-11-09 MED ORDER — BD REGULAR BEVEL NEEDLES 18 GAUGE X 1 1/2"
0 refills | 0 days
Start: 2021-11-09 — End: ?

## 2021-11-10 MED ORDER — BD REGULAR BEVEL NEEDLES 25 GAUGE X 5/8"
0 refills | 0 days | Status: CP
Start: 2021-11-10 — End: ?

## 2021-11-10 MED ORDER — DEXTROAMPHETAMINE-AMPHETAMINE 20 MG TABLET
ORAL_TABLET | Freq: Every day | ORAL | 0 refills | 10 days | Status: CP | PRN
Start: 2021-11-10 — End: 2021-12-10
  Filled 2021-11-12: qty 10, 10d supply, fill #0

## 2021-11-10 MED ORDER — BD REGULAR BEVEL NEEDLES 18 GAUGE X 1 1/2"
0 refills | 0 days | Status: CP
Start: 2021-11-10 — End: ?

## 2021-11-10 MED ORDER — TESTOSTERONE CYPIONATE 200 MG/ML INTRAMUSCULAR OIL
SUBCUTANEOUS | 0 refills | 70 days | Status: CP
Start: 2021-11-10 — End: 2022-01-19
  Filled 2021-11-12: qty 2, 56d supply, fill #0

## 2021-11-10 MED ORDER — CETIRIZINE 10 MG TABLET
ORAL_TABLET | Freq: Every day | ORAL | 12 refills | 30 days | Status: CP
Start: 2021-11-10 — End: ?

## 2021-11-10 NOTE — Unmapped (Signed)
Addended by: Marylyn Ishihara on: 11/10/2021 12:07 PM     Modules accepted: Orders

## 2021-11-10 NOTE — Unmapped (Signed)
Hi Kerrion,    It was great to see you today. As we discussed, you should start weaning your gabapentin now. For the next week, you should take only 10 mL every night at 7:00 PM, followed by the extra 10 mL at 4:00 AM if necessary. After a week of that, you should reduce to just taking the 10 mL dose every night at 7:00 PM (no 4:00 AM dose). After another week, you should reduce to 5 mL every night at 7:00 PM, and then after another week stop taking it altogether.     I look forward to seeing you at your next visit. In the meantime, for non-urgent questions, please message our clinic on MyChart. I am typically able to respond to these messages within 3-5 business days. For more urgent matters, please call our clinic at [phone]. In the event of a medical emergency, call 911 and/or visit our urgent care or an emergency room.     Take care,    Claudie Revering, MD, MPH   He, him, his  Resident Physician, PGY-2    [facility]   [address]   [place], [place] [address]   [phone] (Phone); [phone] (Fax)     [facility] MyChart is for non-urgent messages. I do my best to respond within 3 business days, however occasionally it may take longer. If you have immediate concerns, contact our clinic by phone [phone].      [facility] has an Urgent Care!  Family Medicine Urgent Care Hours: Monday-Friday 7am-8pm; Saturday and Sunday 12pm-5pm   If you think you are having an emergency, please call 911 or go to your nearest emergency department.

## 2021-11-11 DIAGNOSIS — IMO0001 Endocrine disorder in female-to-male transgender person: Principal | ICD-10-CM

## 2021-11-11 DIAGNOSIS — Z789 Other specified health status: Principal | ICD-10-CM

## 2021-11-11 MED ORDER — BD LUER-LOK SYRINGE 1 ML
0 refills | 0 days
Start: 2021-11-11 — End: ?

## 2021-11-12 MED ORDER — GABAPENTIN 250 MG/5 ML ORAL SOLUTION
Freq: Two times a day (BID) | ORAL | 0 refills | 20.00000 days | Status: CP
Start: 2021-11-12 — End: 2021-12-02
  Filled 2021-11-12: qty 400, 20d supply, fill #0

## 2021-11-12 MED ORDER — EASY TOUCH LUER LOCK SYRINGE 1 ML
0 refills | 0 days
Start: 2021-11-12 — End: ?

## 2021-11-12 MED ORDER — OXYCODONE-ACETAMINOPHEN 5 MG-325 MG TABLET
ORAL_TABLET | Freq: Two times a day (BID) | ORAL | 0 refills | 26 days | Status: CP | PRN
Start: 2021-11-12 — End: 2021-12-08
  Filled 2021-11-12: qty 52, 26d supply, fill #0

## 2021-11-12 MED FILL — PANTOPRAZOLE 20 MG TABLET,DELAYED RELEASE: ORAL | 30 days supply | Qty: 30 | Fill #1

## 2021-11-12 MED FILL — BD REGULAR BEVEL NEEDLES 25 GAUGE X 5/8": 84 days supply | Qty: 12 | Fill #0

## 2021-11-12 MED FILL — SYMBICORT 160 MCG-4.5 MCG/ACTUATION HFA AEROSOL INHALER: RESPIRATORY_TRACT | 60 days supply | Qty: 10.2 | Fill #0

## 2021-11-12 MED FILL — BD SAFETYGLIDE NEEDLE 18 GAUGE X 1 1/2": 84 days supply | Qty: 12 | Fill #0

## 2021-11-12 MED FILL — EASY TOUCH LUER LOCK SYRINGE 1 ML: 28 days supply | Qty: 4 | Fill #0

## 2021-11-16 ENCOUNTER — Ambulatory Visit
Admit: 2021-11-16 | Discharge: 2021-11-19 | Disposition: A | Discharge status: Home / Self Care | Payer: PRIVATE HEALTH INSURANCE | Admitting: Specialist

## 2021-11-16 ENCOUNTER — Ambulatory Visit: Admit: 2021-11-16 | Discharge: 2021-11-19 | Payer: PRIVATE HEALTH INSURANCE

## 2021-11-16 ENCOUNTER — Encounter: Admit: 2021-11-16 | Discharge: 2021-11-19 | Payer: PRIVATE HEALTH INSURANCE | Attending: Anesthesiology

## 2021-11-16 MED ADMIN — clindamycin (CLEOCIN) 600 mg/50 mL IVPB: INTRAVENOUS | @ 16:00:00 | Stop: 2021-11-16

## 2021-11-16 MED ADMIN — sodium chloride irrigation (NS) 0.9 % irrigation solution: @ 16:00:00 | Stop: 2021-11-16

## 2021-11-16 MED ADMIN — phenylephrine 0.8 mg/10 mL (80 mcg/mL) injection: INTRAVENOUS | @ 17:00:00 | Stop: 2021-11-16

## 2021-11-16 MED ADMIN — BUPivacaine liposome (PF) (EXPAREL) 266 mg, sodium chloride (NS) 0.9 % 1 mL infiltration injection: 266 mg | @ 17:00:00 | Stop: 2021-11-16

## 2021-11-16 MED ADMIN — sugammadex (BRIDION) injection: INTRAVENOUS | @ 17:00:00 | Stop: 2021-11-16

## 2021-11-16 MED ADMIN — lactated Ringers infusion: 10 mL/h | INTRAVENOUS | @ 13:00:00

## 2021-11-16 MED ADMIN — lidocaine (XYLOCAINE) 20 mg/mL (2 %) injection: INTRAVENOUS | @ 15:00:00 | Stop: 2021-11-16

## 2021-11-16 MED ADMIN — propofoL (DIPRIVAN) injection: INTRAVENOUS | @ 15:00:00 | Stop: 2021-11-16

## 2021-11-16 MED ADMIN — ROCuronium (ZEMURON) injection: INTRAVENOUS | @ 15:00:00 | Stop: 2021-11-16

## 2021-11-16 MED ADMIN — propofol (DIPRIVAN) infusion 10 mg/mL: INTRAVENOUS | @ 16:00:00 | Stop: 2021-11-16

## 2021-11-16 MED ADMIN — fentaNYL (PF) (SUBLIMAZE) injection 25 mcg: 25 ug | INTRAVENOUS | @ 18:00:00 | Stop: 2021-11-16

## 2021-11-16 MED ADMIN — HYDROmorphone (DILAUDID) 50mg/50ml (1mg/ml) PCA CADD: INTRAVENOUS | @ 19:00:00 | Stop: 2021-11-30

## 2021-11-16 MED ADMIN — midazolam (VERSED) injection: INTRAVENOUS | @ 15:00:00 | Stop: 2021-11-16

## 2021-11-16 MED ADMIN — fentaNYL (PF) (SUBLIMAZE) injection: INTRAVENOUS | @ 15:00:00 | Stop: 2021-11-16

## 2021-11-16 MED ADMIN — ondansetron (ZOFRAN) injection: INTRAVENOUS | @ 17:00:00 | Stop: 2021-11-16

## 2021-11-16 MED ADMIN — fentaNYL (PF) (SUBLIMAZE) injection: INTRAVENOUS | @ 16:00:00 | Stop: 2021-11-16

## 2021-11-16 MED ADMIN — acetaminophen (OFIRMEV) 10 mg/mL injection: INTRAVENOUS | @ 17:00:00 | Stop: 2021-11-16

## 2021-11-16 MED ADMIN — BUPivacaine (PF) (MARCAINE) 0.25 % (2.5 mg/mL) injection (PF): @ 17:00:00 | Stop: 2021-11-16

## 2021-11-16 MED ADMIN — matrix hemostatic sealant (FLOSEAL) topical: TOPICAL | @ 16:00:00 | Stop: 2021-11-16

## 2021-11-16 NOTE — Unmapped (Cosign Needed)
Operative Note    Preoperative Diagnosis: Right neurogenic thoracic outlet syndrome    Postoperative Diagnosis: Same    Procedure Performed:   1. Decompression of the right thoracic outlet with anterior scalenectomy, middle scalenectomy, and first rib resection   2. right Pectoralis minor release    Teaching Surgeon: Thora Lance MD    Resident Surgeon: Yehuda Budd MD; Cathie Beams, MD    Anesthesia: General Endotracheal    Specimens: 1st rib     Implants: None     Estimated Blood Loss: 50 mL    Drains: Right pleural drain via supraclavicular incision (8Fr Blake drain)    Complications: None    Indications for Surgery: neurogenic TOS    Operative Findings:  pleural violation noted for which R chest tube was placed via supraclavicular incision.     Procedure Details:   The patient was placed on the operating room table in the supine position with a shoulder roll and neck extended and rotated to the contralateral side. The neck and chest were prepped and draped in sterile fashion.      The incision was marked above the clavicle, centered lateral to the sternocleidomastoid but extending over the muscle belly of the clavicular head of the SCM. The skin incision was made with a 15-blade and carried down through the subcutaneous tissue and platysma with electrocautery. The clavicular head of the sternocleidomastoid muscle was divided with cautery. The scalene fat pad was mobilized sharply along its inferior aspect, followed by blunt dissection inferior to superior. With the fat pad fully mobilized, we were able to completely expose the anterior scalene. The phrenic nerve was not identified. The anterior scalene was sharply isolated all the way to its insertion on the scalene tubercle of the first rib.     We encountered some venous bleeding during this dissection for which multiple clips were applied with excellent hemostasis as a result.     We identified the superior, middle, and inferior brachial plexus trunks and retracted this gently laterally to gain better exposure to the lateral edge of the anterior scalene. The anterior scalene muscle was then divided right at its insertion onto the first rib. The muscle was grasped and swung medially, dividing it sharply superiorly. The subclavian artery was visualized in the field.  The posterior aspect of the first rib was then palpated right under the body of the middle scalene. The muscle was carefully dissected along its edges to its insertion on the first rib. The subclavian artery and its branches were identified and preserved. A middle scalenectomy was performed.     A periosteal elevator and right angle were then used to better define the rib edges. The remainder of the intercostal muscles were then separated from the rib along its medial and lateral aspects by blunt dissection underneath the rib, taking care not to injure the neurovascular bundle. We then proceeded with division of the first rib anteriorly, retracting and protecting the subclavian artery and brachial plexus. Bone cutters were then used to divide the bone in the posterior aspect. A Kocher was used to grasp the rib and remove it. Ronguer was used to trim the edges back further to assure that there would be no compression of the nerves.     We visualized a small pleural rent, for which a 8F Blake drain was inserted via the supraclavicular incision. This was connected to the pleurovac and the drain secured with a 3-0 nylon suture.    Finally, a pectoralis minor release  was performed. A longitudinal incision was made in the deltopectoral groove. The incision was carried down through the subcutaneous tissue with Bovie. The pectoralis major was retracted and the insertion of the pectoralis minor onto the coracoid process was visualized. A right angle was passed under the tendon and it was divided off the bone with scissors.     With the procedure complete, we irrigated the wound and ensured hemostasis.   SepraFilm was mixed with saline and poured over the brachial plexus. The fat pad was replaced. The platysma was closed with interrupted 3-0 Vicryls.   We injected Exparel along the incisions. The skin was closed with deep dermal interrupted 3-0 Vicryls and a running 4-0 Monocryl subcuticular stitch. The wound was then washed and dried and Dermabond  was applied. Pectoralis minor incision was also closed in similar fashion.     Teaching Surgeon Attestation: attending surgeon was present throughout the procedure

## 2021-11-16 NOTE — Unmapped (Signed)
Vascular Surgery H&P    Patient Name: Vincent Waters DOB:06/13/1981  Date of Encounter: 11/16/2021    Assessment:  Patient is a 41 y.o. male with history of bilateral UE (R>L) pain and paresthesia likely consistent with neurogenic thoracic outlet syndrome. 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 MRI brachial plexus right 09/03/21 showing mild bilateral foraminal narrowing at C6-7. RUE scalene and pec minor injections were significantly positive. Patient would like to proceed with surgery.    The risks and benefits of first rib +/- cervical rib resection, scalenectomy, and brachial plexus neurolysis have been discussed with the patient. Approximately 70% of patients will experience near complete improvement, 20% will have some improvement, and 10% will not improve and may worsen after surgery. During surgery there is risk of injury to the sympathetic chain, phrenic nerve, thoracic duct, subclavian artery and vein, and to the pleura requiring chest tube placement. In the event of an uncomplicated surgery, the expected length of stay is 2-4 days. The patient understands and has consented to surgery.     Plan:  - We will plan for RUE decompression with pec minor release, scheduled for 11/16/21; consent signed.  - Preoperative labs and CXR reviewed.       HPI: Vincent Waters is a 41 y.o. 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 Patient has obtained imaging and undergone diagnostic injections. Patient states that scalene injection 09/08/21 and pec minor injection 09/10/21 both provided total relief. He reiterates that PT has not provided relief. Patient states he is okay with having a foley catheter during surgery.     Current pain medications:  He is on Percocet 5-325 mg BID PRN.     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 For 4 years, he has had limited shoulder mobility, right neck pain and stiffness, shoulder pain radiating down the arm, and tingling and cramping in the fingers in an ulnar distribution. (Patient initially complained of RUE sx but at follow up endorses a component of LUE sx as well). He was previously seen at [facility] and had glenohumeral injections and PT for possible frozen shoulder with some improvement. Patient saw Dr. Clydene Pugh in Ortho in 09/2019 who felt sx were possibly 2/2 TOS vs. cervical radiculopathy. EMG was ordered at that time but never obtained. Patient has also considered rotator cuff surgery with a provider in [place]. Previous shoulder MRIs have found mild tendinosis of supraspinatus tendon. Patient has tried PT and dry needling without relief.     Past Medical History:   Past Medical History:   Diagnosis Date   ??? Anxiety    ??? Asthma    ??? Autoimmune autonomic neuropathy    ??? Breast cyst    ??? Financial difficulties    ??? Migraines    ??? Neuromuscular disorder (CMS-HCC)    ??? Scleroderma (CMS-HCC)    ??? Visual impairment        Past Surgical History:  Past Surgical History:   Procedure Laterality Date   ??? CHOLECYSTECTOMY     ??? HYSTERECTOMY Bilateral     08/2017   ??? MASTECTOMY      04/2018   ??? OOPHORECTOMY  2005    laparotomy   ??? PR CYSTOURETHROSCOPY N/A 08/30/2017  Procedure: CYSTOURETHROSCOPY (SEPARATE PROCEDURE);  Surgeon: Forbes Cellar, MD;  [facility];  Service: Advanced Laparoscopy   ??? PR EXCISION TURBINATE,SUBMUCOUS Bilateral 01/04/2021    Procedure: SUBMUCOUS RESECTION INFERIOR TURBINATE, PARTIAL OR COMPLETE, ANY METHOD;  Surgeon: Adam Swaziland Kimple, MD;  Location: [facility] OR [facility];  Service: ENT   ??? PR LAPAROSCOPY W TOT HYSTERECTUTERUS <=250 GRAM  W TUBE/OVARY N/A 08/30/2017    Procedure: LAPAROSCOPY, SURGICAL, WITH TOTAL HYSTERECTOMY, FOR UTERUS 250 G OR LESS; W/REMOVAL TUBE(S) AND/OR OVARY(S);  Surgeon: Forbes Cellar, MD;  Location: [facility]  Service: Advanced Laparoscopy   ??? PR MASTECTOMY, SIMPLE, COMPLETE Bilateral 01/09/2020    Procedure: MASTECTOMY, SIMPLE, COMPLETE;  Surgeon: Carola Rhine, MD;  Location: MAIN OR [facility];  Service: Plastics   ??? PR REPAIR OF NASAL SEPTUM N/A 01/04/2021    Procedure: SEPTOPLASTY OR SUBMUCOUS RESECTION, WITH OR WITHOUT CARTILAGE SCORING, CONTOURING OR REPLACEMENT WITH GRAFT;  Surgeon: Adam Swaziland Kimple, MD;  Location: [facility];  Service: ENT   ??? PR UPPER GI ENDOSCOPY,BIOPSY N/A 08/13/2019    Procedure: UGI ENDOSCOPY; WITH BIOPSY, SINGLE OR MULTIPLE;  Surgeon: Alfred Levins, MD;  Location: [facility] PROCEDURES [facility];  Service: Gastroenterology   ??? SINUS SURGERY         Social History:  Patient  reports that he has never smoked. He has never used smokeless tobacco. He reports that he does not currently use alcohol. He reports current drug use. Drug: Marijuana.    ROS:  Balance of systems reviewed x 10 and negative except as noted in HPI.      DASH Score  Date    09/23/2021     Physical Examination:   Vitals:    11/16/21 0852   BP: 122/81   Pulse: 78   Resp: 18   Temp: 36.8 ??C (98.2 ??F)   SpO2: 99%     General: Well appearing, no acute distress  Psych: Awake, Alert , Oriented by 3. Affect is appropriate.  Neck: Supple, no evidence of JVD, no evidence of thyromegaly, no cervical bruits  Cardiac: RRR  Lungs: Easy work of breathing,   Upper extremities: Warm and well perfused. Radial artery pulses bilaterally 2+. Limited RUE ROM and shoulder mobility.   RUE with numbness in the medial upper arm, forearm, and 4th and 5th finger digits.     Imaging (independently reviewed):    MRA chest 09/03/21 - Narrowing of the left axillary vein, unchanged on abduction and adduction, likely from prior deep vein thrombosis. Serpiginous signal abnormality within the right humeral, new since prior MRI shoulder, with thickening of the subacromial bursa which may represent avascular necrosis and bursitis. A component of adhesive capsulitis is also likely present  MRI brachial plexus right 09/03/21 - Minimal dynamic narrowing of the proximal subclavian veins with arm abduction. Multilevel cervical spondylosis with mild bilateral foraminal narrowing at C6-7.

## 2021-11-17 LAB — BASIC METABOLIC PANEL
ANION GAP: 8 mmol/L (ref 5–14)
BLOOD UREA NITROGEN: 8 mg/dL — ABNORMAL LOW (ref 9–23)
BUN / CREAT RATIO: 10
CALCIUM: 8.8 mg/dL (ref 8.7–10.4)
CHLORIDE: 105 mmol/L (ref 98–107)
CO2: 26 mmol/L (ref 20.0–31.0)
CREATININE: 0.8 mg/dL
EGFR CKD-EPI (2021) MALE: >90 mL/min/{1.73_m2} (ref >=60)
GLUCOSE RANDOM: 109 mg/dL (ref 70–179)
POTASSIUM: 3.9 mmol/L (ref 3.4–4.8)
SODIUM: 139 mmol/L (ref 135–145)

## 2021-11-17 LAB — CBC
HEMATOCRIT: 40.7 % (ref 36.0–46.0)
HEMOGLOBIN: 13.7 g/dL (ref 12.1–15.7)
MEAN CORPUSCULAR HEMOGLOBIN CONC: 33.6 g/dL (ref 32.0–36.0)
MEAN CORPUSCULAR HEMOGLOBIN: 27 pg (ref 25.9–32.4)
MEAN CORPUSCULAR VOLUME: 80.3 fL (ref 77.6–95.7)
MEAN PLATELET VOLUME: 7.6 fL (ref 6.8–10.7)
PLATELET COUNT: 275 10*9/L (ref 150–450)
RED BLOOD CELL COUNT: 5.07 10*12/L (ref 4.11–5.36)
RED CELL DISTRIBUTION WIDTH: 14.4 % (ref 12.2–15.2)
WBC ADJUSTED: 9.6 10*9/L (ref 3.6–11.2)

## 2021-11-17 LAB — PHOSPHORUS: PHOSPHORUS: 3.3 mg/dL (ref 2.4–5.1)

## 2021-11-17 LAB — MAGNESIUM: MAGNESIUM: 1.8 mg/dL (ref 1.6–2.6)

## 2021-11-17 MED ADMIN — acetaminophen (TYLENOL) tablet 1,000 mg: 1000 mg | ORAL | @ 18:00:00

## 2021-11-17 MED ADMIN — oxyCODONE (ROXICODONE) immediate release tablet 10 mg: 10 mg | ORAL | Stop: 2021-11-30

## 2021-11-17 MED ADMIN — diazePAM (VALIUM) tablet 5 mg: 5 mg | ORAL | @ 23:00:00

## 2021-11-17 MED ADMIN — enoxaparin (LOVENOX) syringe 40 mg: 40 mg | SUBCUTANEOUS | @ 14:00:00

## 2021-11-17 MED ADMIN — famotidine (PEPCID) tablet 40 mg: 40 mg | ORAL

## 2021-11-17 MED ADMIN — HYDROmorphone (DILAUDID) 50mg/50ml (1mg/ml) PCA CADD: INTRAVENOUS | @ 11:00:00 | Stop: 2021-11-30

## 2021-11-17 MED ADMIN — oxyCODONE (ROXICODONE) immediate release tablet 10 mg: 10 mg | ORAL | @ 09:00:00 | Stop: 2021-11-30

## 2021-11-17 MED ADMIN — polyethylene glycol (MIRALAX) packet 17 g: 17 g | ORAL | @ 14:00:00

## 2021-11-17 MED ADMIN — gabapentin (NEURONTIN) capsule 300 mg: 300 mg | ORAL

## 2021-11-17 MED ADMIN — furosemide (LASIX) injection 20 mg: 20 mg | INTRAVENOUS | @ 14:00:00 | Stop: 2021-11-17

## 2021-11-17 MED ADMIN — diazePAM (VALIUM) tablet 5 mg: 5 mg | ORAL | @ 17:00:00

## 2021-11-17 MED ADMIN — gabapentin (NEURONTIN) capsule 300 mg: 300 mg | ORAL | @ 14:00:00

## 2021-11-17 MED ADMIN — acetaminophen (TYLENOL) tablet 1,000 mg: 1000 mg | ORAL | @ 02:00:00

## 2021-11-17 MED ADMIN — oxyCODONE (ROXICODONE) immediate release tablet 5 mg: 5 mg | ORAL | @ 18:00:00 | Stop: 2021-11-30

## 2021-11-17 MED ADMIN — famotidine (PEPCID) tablet 40 mg: 40 mg | ORAL | @ 23:00:00

## 2021-11-17 MED ADMIN — diazePAM (VALIUM) tablet 5 mg: 5 mg | ORAL | @ 09:00:00

## 2021-11-17 MED ADMIN — magnesium sulfate 2gm/50mL IVPB: 2 g | INTRAVENOUS | @ 19:00:00 | Stop: 2021-11-17

## 2021-11-17 MED ADMIN — fluticasone furoate-vilanteroL (BREO ELLIPTA) 200-25 mcg/dose inhaler 1 puff: 1 | RESPIRATORY_TRACT | @ 14:00:00

## 2021-11-17 MED ADMIN — gabapentin (NEURONTIN) capsule 300 mg: 300 mg | ORAL | @ 18:00:00

## 2021-11-17 MED ADMIN — diazePAM (VALIUM) tablet 5 mg: 5 mg | ORAL | @ 02:00:00

## 2021-11-17 MED ADMIN — acetaminophen (TYLENOL) tablet 1,000 mg: 1000 mg | ORAL | @ 10:00:00

## 2021-11-17 NOTE — Unmapped (Incomplete)
VSS. NSR. A&Ox4. 2L [place]. Chest tube -20 suction. Patient from PACU. Visitors at bedside. PCA pump utilized. Dinner ordered. No BM, UOP adequate. Incentive spirometry education provided. Patient in bed, watching television.      Problem: Impaired Wound Healing  Goal: Optimal Wound Healing  Outcome: Progressing  Intervention: Promote Wound Healing  Recent Flowsheet Documentation  Taken 11/16/2021 1800 by Hyman BowerGabrielle A Corinn Stoltzfus, RN  Activity Management: activity adjusted per tolerance  Taken 11/16/2021 1656 by Hyman BowerGabrielle A Mehr Depaoli, RN  Activity Management: activity adjusted per tolerance     Problem: Latex Allergy  Goal: Absence of Allergy Symptoms  Outcome: Progressing     Problem: Adult Inpatient Plan of Care  Goal: Plan of Care Review  Outcome: Progressing  Goal: Patient-Specific Goal (Individualized)  Outcome: Progressing  Goal: Absence of Hospital-Acquired Illness or Injury  Outcome: Progressing  Intervention: Identify and Manage Fall Risk  Recent Flowsheet Documentation  Taken 11/16/2021 1656 by Hyman BowerGabrielle A Jemmie Rhinehart, RN  Safety Interventions:   bleeding precautions   fall reduction program maintained   family at bedside   lighting adjusted for tasks/safety   low bed   nonskid shoes/slippers when out of bed  Intervention: Prevent Skin Injury  Recent Flowsheet Documentation  Taken 11/16/2021 1800 by Hyman BowerGabrielle A Daisean Brodhead, RN  Skin Protection: adhesive use limited  Taken 11/16/2021 1700 by Hyman BowerGabrielle A Ludwin Flahive, RN  Skin Protection: adhesive use limited  Intervention: Prevent and Manage VTE (Venous Thromboembolism) Risk  Recent Flowsheet Documentation  Taken 11/16/2021 1800 by Hyman BowerGabrielle A Corian Handley, RN  Activity Management: activity adjusted per tolerance  Taken 11/16/2021 1656 by Hyman BowerGabrielle A Cornellius Kropp, RN  Activity Management: activity adjusted per tolerance  Goal: Optimal Comfort and Wellbeing  Outcome: Progressing  Goal: Readiness for Transition of Care  Outcome: Progressing  Goal: Rounds/Family Conference  Outcome: Progressing

## 2021-11-17 NOTE — Unmapped (Signed)
Received pt resting on the bed, alert and oriented x4, on  2L O2, SR on telemetry, BP stable. Chest tube to sxn with moderate sanguineous drainage, incision with glue well approximated, pain control with DPCA and PRN Oxycodone. Pt eating and drinking adequately, Voids adequate uop. Pt assisted to reposition in bed as needed, pt resting with eyes closed on and off most of the night. Falls and safety precautions in place.       Problem: Impaired Wound Healing  Goal: Optimal Wound Healing  Intervention: Promote Wound Healing  Recent Flowsheet Documentation  Taken 11/16/2021 2000 by Julieta Bellini, RN  Activity Management:   activity adjusted per tolerance   activity encouraged     Problem: Adult Inpatient Plan of Care  Goal: Absence of Hospital-Acquired Illness or Injury  Intervention: Identify and Manage Fall Risk  Recent Flowsheet Documentation  Taken 11/16/2021 2000 by Julieta Bellini, RN  Safety Interventions:   aspiration precautions   fall reduction program maintained   low bed  Intervention: Prevent Skin Injury  Recent Flowsheet Documentation  Taken 11/16/2021 2000 by Julieta Bellini, RN  Skin Protection: adhesive use limited  Intervention: Prevent and Manage VTE (Venous Thromboembolism) Risk  Recent Flowsheet Documentation  Taken 11/16/2021 2000 by Julieta Bellini, RN  Activity Management:   activity adjusted per tolerance   activity encouraged     Problem: Fall Injury Risk  Goal: Absence of Fall and Fall-Related Injury  Intervention: Promote Injury-Free Environment  Recent Flowsheet Documentation  Taken 11/16/2021 2000 by Julieta Bellini, RN  Safety Interventions:   aspiration precautions   fall reduction program maintained   low bed

## 2021-11-17 NOTE — Unmapped (Signed)
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 RUE scalene and pec minor injections were significantly positive.     Patient lives with 2 roommates and one of the roommates works here at the hospital. Has a cane , CPAP  w/ [facility] for DME. No Va, HCPOA, oxygen, HH, Chemo/Rad/Dialysis. Independent w/ADLs, drives to appointments. PCP verified and last seen 2 weeks ago. Demographics verified.     ADDRESS:  Type of Residence: Mailing Address:  [address] [name]  [place] [address]  Contacts:    Patient Phone Number: [phone] (mobile)  Per patient this is her mailing address-however=Her physical address is: [address] [place], [place], [address].        Medical Provider(s): Kern Reap, MD  Reason for Admission: Admitting Diagnosis:  TOS  Past Medical History:   has a past medical history of Anxiety, Asthma, Autoimmune autonomic neuropathy, Breast cyst, Financial difficulties, Migraines, Neuromuscular disorder (CMS-HCC), Scleroderma (CMS-HCC), and Visual impairment.  Past Surgical History:   has a past surgical history that includes Cholecystectomy; pr laparoscopy w tot hysterectuterus <=250 gram  w tube/ovary (N/A, 08/30/2017); pr cystourethroscopy (N/A, 08/30/2017); pr upper gi endoscopy,biopsy (N/A, 08/13/2019); Hysterectomy (Bilateral); Mastectomy; Oophorectomy (2005); pr mastectomy, simple, complete (Bilateral, 01/09/2020); pr repair of nasal septum (N/A, 01/04/2021); pr excision turbinate,submucous (Bilateral, 01/04/2021); and Sinus surgery.   Previous admit date: N/A    Primary Insurance- Payor: BCBS / Plan: Electrical engineer WITH [employer] / Product Type: *No Product type* /   Secondary Insurance - None  Prescription Coverage - yes  Preferred Pharmacy - [facility] [place] [facility]  [facility]  [facility]  [facility]  [facility] [place] [facility]    Transportation home: Private vehicle              Contact/Decision Maker  Extended Emergency Contact Information  Primary Emergency Contact: Arminda [place]  Home Phone: [phone]  Mobile Phone: [phone]  Relation: Mother  Preferred language: ENGLISH  Interpreter needed? No  Secondary Emergency Contact: Smith,Sole  Mobile Phone: [phone]  Relation: Friend  Interpreter needed? No    Legal Next of Kin / Guardian / POA / Advance Directives     HCDM (patient stated preference): Smith,Sole - Friend - [phone]    Advance Directive (Medical Treatment)  Does patient have an advance directive covering medical treatment?: Patient does not have advance directive covering medical treatment.       Readmission Information    Have you been hospitalized in the last 30 days?: No        Patient Information  Lives with: Spouse/significant other    Type of Residence: Private residence    Type of Residence: Mailing Address:  [name]  [place] [address]  Contacts:    Patient Phone Number: [phone] (mobile)  Medical Provider(s): Kern Reap, MD  Reason for Admission: Admitting Diagnosis:  TOS  Past Medical History:   has a past medical history of Anxiety, Asthma, Autoimmune autonomic neuropathy, Breast cyst, Financial difficulties, Migraines, Neuromuscular disorder (CMS-HCC), Scleroderma (CMS-HCC), and Visual impairment.  Past Surgical History:   has a past surgical history that includes Cholecystectomy; pr laparoscopy w tot hysterectuterus <=250 gram  w tube/ovary (N/A, 08/30/2017); pr cystourethroscopy (N/A, 08/30/2017); pr upper gi endoscopy,biopsy (N/A, 08/13/2019); Hysterectomy (Bilateral); Mastectomy; Oophorectomy (2005); pr mastectomy, simple, complete (Bilateral, 01/09/2020); pr repair of nasal septum (N/A, 01/04/2021); pr excision turbinate,submucous (Bilateral, 01/04/2021); and Sinus surgery.   Previous admit date: N/A    Primary Insurance- Payor: BCBS / Plan: Electrical engineer WITH [employer] / Product Type: *No Product type* /   Secondary Insurance - None  Prescription Coverage - yes  Preferred Pharmacy - [facility] [place] [facility]  [facility]  [facility]  [facility]  [facility] [place] [facility]    Transportation home: Private vehicle via Friend will take home.          Support Systems/Concerns: Friends/Neighbors, Family Members, Parent    Responsibilities/Dependents at home?: No    Home Care services in place prior to admission?: No        Equipment Currently Used at Home: none       Currently receiving outpatient dialysis?: N/A       Financial Information       Need for financial assistance?: No       Social Determinants of Health  Food Insecurity: No Food Insecurity    Worried About Programme researcher, broadcasting/film/video in the Last Year: Never true    Barista in the Last Year: Never true      Financial Resource Strain: Low Risk     Difficulty of Paying Living Expenses: Not very hard     Housing/Utilities: Low Risk     Within the past 12 months, have you ever stayed: outside, in a car, in a tent, in an overnight shelter, or temporarily in someone else's home (i.e. couch-surfing)?: No    Are you worried about losing your housing?: No    Within the past 12 months, have you been unable to get utilities (heat, electricity) when it was really needed?: No     Transportation Needs: No Transportation Needs    Lack of Transportation (Medical): No    Lack of Transportation (Non-Medical): No           Complex Discharge Information    Is patient identified as a difficult/complex discharge?: No       Discharge Needs Assessment  Concerns to be Addressed: no discharge needs identified    Clinical Risk Factors: Poor Health Literacy    Barriers to taking medications: No    Prior overnight hospital stay or ED visit in last 90 days: No      Anticipated Changes Related to Illness: none    Equipment Needed After Discharge: none    Discharge Facility/Level of Care Needs: other (see comments) (Home)    Readmission  Risk of Unplanned Readmission Score: UNPLANNED READMISSION SCORE: 12.35%  Predictive Model Details          12% (Medium)  Factor Value    Calculated 11/17/2021 12:03 38% Number of active Rx orders 40     Risk of Unplanned Readmission Model 28% Diagnosis of drug abuse present  9% Imaging order present in last 6 months     6% Active anticoagulant Rx order present     6% Active corticosteroid Rx order present     4% Age 28     3% Charlson Comorbidity Index 2     3% Future appointment scheduled     2% Current length of stay 1.128 days     1% Active ulcer medication Rx order present      Readmitted Within the Last 30 Days? (No if blank)   Patient at risk for readmission?: No    Discharge Plan  Screen findings are: Care Manager reviewed the plan of the patient's care with the Multidisciplinary Team. No discharge planning needs identified at this time. Care Manager will continue to manage plan and monitor patient's progress with the team.    Expected Discharge Date: 11/19/2021    Expected Transfer from Critical Care:      Quality data for continuing care services shared with patient and/or representative?: N/A  Patient and/or family were provided with choice of facilities / services that are available and appropriate to meet post hospital care needs?: N/A       Initial Assessment complete?: Yes

## 2021-11-17 NOTE — Unmapped (Signed)
Surgery Progress Note    Service Date: 11/17/2021  Admit Date: 11/16/2021, Hospital Day: 2  Hospital Service: Surg Vascular (SRV)  Attending: Thora Lance, MD    Assessment     Vincent Waters is a 41 y.o. 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     1 Day Post-Op s/p R TOS decompression, pec minor release.    Interval Events/Subjective   No acute events overnight. Pain controlled overnight, prn oxy 10mg  x2 yesterday.    Plan     Neuro/Pain:  - Multimodal pain regimen  - Anti-emetics prn  - dPCA   - Decrease PCA to half dose, will consider Dcing it tomorrow    CV/Pulm:  - Hemodynamically stable  - Chest tube to suction, no air leaks   - Repeat CXR this morning due to ice pack obstructing lung field   - Will consider CT to waterseal    FEN/GI:  Fluids: medlocked  Electrolytes: replete prn  Nutrition: Nutrition Therapy Regular/House  - Scheduled bowel reg    GU/Renal:  - Voiding spontaneously   - Lasix 20 IV today    Heme/ID:  - HGB stable  - Lovenox DVT ppx     - OT to evaluate today    Disposition: Stepdown status.     Please page SRV [phone] with questions or concerns.      Objective     Vitals:   Temp:  [36.5 ??C (97.7 ??F)-36.8 ??C (98.2 ??F)] 36.7 ??C (98.1 ??F)  Heart Rate:  [67-103] 71  SpO2 Pulse:  [43-102] 59  Resp:  [9-30] 21  BP: (106-138)/(62-97) 127/62  MAP (mmHg):  [80-102] 86  SpO2:  [90 %-100 %] 94 %    Input/Output:  I/O last 3 completed shifts:  In: 1150 [P.O.:50; I.V.:1000; IV Piggyback:100]  Out: 130 [Drains:80; Blood:50]    Physical Exam:  -General: Appropriate, comfortable and in no apparent distress.   -Neurological: Alert and oriented x3.  -Cardiovascular: HDS  -Pulmonary: Normal work of breathing. CT to suction with no air leak  -Abdomen: Soft, non-tender, non-distended. No rebound or guarding. Incisions c/d/I.  -GU: No foley in place  -MSK: Moves all 4 extremities spontaneously.   -Extremities: Warm, well perfused.     Recent Imaging:  XR Chest Portable    Result Date: 11/16/2021  EXAM: XR CHEST PORTABLE DATE: 11/16/2021 2:56 PM ACCESSION: [id number] UN DICTATED: 11/16/2021 3:00 PM INTERPRETATION LOCATION: MAIN CAMPUS     CLINICAL DATA:     PNEUMOTHORAX     COMPARISON:     09/27/2021     TECHNIQUE:     XR CHEST PORTABLE     FINDINGS:     Post right first rib resection. Mild soft tissue emphysema right neck. No overt pneumothorax. Mild bilateral atelectasis. Right pleural drain.             Post right first rib dissection.      Labs:  Lab Results   Component Value Date    WBC 9.6 11/17/2021    HGB 13.7 11/17/2021    HCT 40.7 11/17/2021    PLT 275 11/17/2021       Lab Results   Component Value Date    NA 140 09/27/2021    K 3.7 09/27/2021    CL 105 09/27/2021    CO2 29.0 09/27/2021    BUN 8 (L) 09/27/2021    CREATININE 0.95 (H) 09/27/2021    GLU 89 09/27/2021    CALCIUM 9.5  09/27/2021    MG 2.0 06/05/2020    PHOS 3.0 06/11/2020         Sharion Settler, MD   PGY-1

## 2021-11-17 NOTE — Unmapped (Signed)
OCCUPATIONAL THERAPY  Evaluation (11/17/21 0903)    Patient Name:  Vincent Waters [facility]       Medical Record Number: [id number]   Date of Birth: [birth year]  Sex: Adult            OT Treatment Diagnosis:  Patient presents to OT w/ below baseline level of independence s/p R TOS decompression    Assessment  Problem List: Decreased strength, Impaired sensation, Pain, Decreased range of motion, Impaired ADLs        Assessment: From Epic: Cavion Leiland Tudor is a 41 y.o. 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        Patient seen for initial OT evaluation and occupational profile. With consideration of patient's occupational profile, assessment review, level of clinical decision making involved, and intervention plan, patient presents as a moderate complexity case w/ the following functional deficits: decreased UE strength , impaired UE ROM, impaired sensation , and decreased grip strength in dominant arm that impact independent participation in ADLs. Recommend post-acute OT 3x/week(outpatient) to maximize safety and functional independence.       Today's Interventions: Patient educated regarding: role of OT, OT POC, TOS precautions and HEP, toilet aids, adaptive dressing techniques, and the role of daily participation in ADLs for maintaining/improving activity tolerance while in the hospital. Patient performed bed mobility and chair transfer w/ SBA. S/u for eating breakfast w/ increased time required for perform tasks (pouring water)- patient educated regarding gravity elimination w/ elbow support to assist w/ tasks. TOS HEP performed x 3 reps within pain tolerance, cues provided for postural corrections during exercises.    Activity Tolerance During Today's Session  Tolerated treatment well    Plan  Planned Frequency of Treatment:  1-2x per day for: 4-5x week  Planned Treatment Duration: 12/15/21    Planned Interventions:  Adaptive equipment, ADL retraining, Bed mobility, Compensatory tech. training, Education - Patient, Home exercise program, Therapeutic exercise, Range of motion, Passive range of motion, UE Strength / coordination exercise    Post-Discharge Occupational Therapy Recommendations:   3x weekly, Community Ambulator   OT DME Recommendations: None -        GOALS:   Patient and Family Goals: To regain function of R UE    IP Long Term Goal #1: Patient will score 24/24 on the AMPAC within 4 weeks       Short Term:  SHORT GOAL #1: Patient will be independent w/ TOS HEP   Time Frame : 1 week  SHORT GOAL #2: Patient will be mod I for full body dressing w/ use of AE and compensatory strategies   Time Frame : 2 weeks  SHORT GOAL #3: Patient will be mod I for toilet transfer + hygiene w/ use of LRAD   Time Frame : 2 weeks       Prognosis:  Excellent  Positive Indicators:  PLOF, age, current level of function, motivation  Barriers to Discharge: None    Subjective  Current Status Patient left seated in the bedside chair w/ lines intact, call bell within reach, all immediate needs met, RN bedside  Prior Functional Status Patient reports that he lives w/ roommates x 2- rommates can provide assistance as needed. Patient reports modified independence w/ self-care tasks at baseline w/ patient performing some tasks w/ non-dominant hand 2/2 ROM deficits in R UE. Independent w/ IADLs. Denies recent falls.    Medical Tests / Procedures: Reviewed in EPIC       Patient /  Caregiver reports: That was better than I thought it was going to be    Past Medical History:   Diagnosis Date    Anxiety     Asthma     Autoimmune autonomic neuropathy     Breast cyst     Financial difficulties     Migraines     Neuromuscular disorder (CMS-HCC)     Scleroderma (CMS-HCC)     Visual impairment     Social History     Tobacco Use    Smoking status: Never    Smokeless tobacco: Never   Substance Use Topics    Alcohol use: Not Currently      Past Surgical History:   Procedure Laterality Date CHOLECYSTECTOMY      HYSTERECTOMY Bilateral     08/2017    MASTECTOMY      04/2018    OOPHORECTOMY  2005    laparotomy    PR CYSTOURETHROSCOPY N/A 08/30/2017    Procedure: CYSTOURETHROSCOPY (SEPARATE PROCEDURE);  Surgeon: Forbes Cellar, MD;  [facility];  Service: Advanced Laparoscopy    PR EXCISION TURBINATE,SUBMUCOUS Bilateral 01/04/2021    Procedure: SUBMUCOUS RESECTION INFERIOR TURBINATE, PARTIAL OR COMPLETE, ANY METHOD;  Surgeon: Adam Swaziland Kimple, MD;  Location: [facility] OR [facility];  Service: ENT    PR LAPAROSCOPY W TOT HYSTERECTUTERUS <=250 GRAM  W TUBE/OVARY N/A 08/30/2017    Procedure: LAPAROSCOPY, SURGICAL, WITH TOTAL HYSTERECTOMY, FOR UTERUS 250 G OR LESS; W/REMOVAL TUBE(S) AND/OR OVARY(S);  Surgeon: Forbes Cellar, MD;  Location: [facility] OR [facility];  Service: Advanced Laparoscopy    PR MASTECTOMY, SIMPLE, COMPLETE Bilateral 01/09/2020    Procedure: MASTECTOMY, SIMPLE, COMPLETE;  Surgeon: Carola Rhine, MD;  Location: MAIN OR [place];  Service: Plastics    PR REPAIR OF NASAL SEPTUM N/A 01/04/2021    Procedure: SEPTOPLASTY OR SUBMUCOUS RESECTION, WITH OR WITHOUT CARTILAGE SCORING, CONTOURING OR REPLACEMENT WITH GRAFT;  Surgeon: Adam Swaziland Kimple, MD;  Location: [facility] OR [facility];  Service: ENT    PR UPPER GI ENDOSCOPY,BIOPSY N/A 08/13/2019    Procedure: UGI ENDOSCOPY; WITH BIOPSY, SINGLE OR MULTIPLE;  Surgeon: Alfred Levins, MD;  Location: [facility];  Service: Gastroenterology    SINUS SURGERY      Family History   Problem Relation Age of Onset    Breast cancer Sister     Cancer Sister         breast    Ovarian cancer Maternal Grandmother     Breast cancer Maternal Grandmother     Cataracts Maternal Grandmother     Cancer Mother         breast    No Known Problems Father         Latex; Penicillins; Shellfish containing products; Other; Latex, natural rubber; and Penicillin     Objective Findings  Precautions / Restrictions   (TOS)    Weight Bearing Non-applicable    Required Braces or Orthoses  Non-applicable    Communication Preference  Verbal    Pain  Patient reports 5/10 pain in R back/shoulder blade. Emotional support provided, PCA use x 1 during session    Equipment / Environment  Chest tube(s) to waterseal, PCA, Supplemental oxygen, Telemetry, Vascular access (PIV, TLC, Port-a-cath, PICC), Patient not wearing mask for full session    Living Situation  Living Environment: House  Lives With: Friend(s)  Home Living: Walk-in shower, Chief Executive Officer available at home: Shower Chair with back  Cognition   Orientation Level:  Oriented x 4   Arousal/Alertness:  Appropriate responses to stimuli   Attention Span:  Appears intact   Memory:  Appears intact   Following Commands:  Follows all commands and directions without difficulty   Safety Judgment:  Good awareness of safety precautions   Awareness of Errors:  Good awareness of errors made   Problem Solving:  Able to problem solve independently   Comments:      Vision / Hearing   Vision: No acute deficits identified, Wears glasses all the time, Glasses present     Hearing: No deficit identified         Hand Function:  Right Hand Function: Right hand function impaired  Right Hand Impairment: grip strength fair  Left Hand Function: Left hand grip strength, ROM and coordination WNL  Hand Dominance: Right    Skin Inspection:  Skin Inspection: Intact where visualized, Good skin integrity, Incision C/D/I    ROM / Strength:  UE ROM/Strength: Left WFL, Right Impaired/Limited  RUE Impairment: Reduced strength, Limited AROM, Pain with movement  UE ROM/ Strength Comment: AROM ~80* for shoulder flexion/abduction  LE ROM/Strength: Left WFL, Right WFL    Coordination:  Coordination: Not tested    Sensation:  RUE Sensation: RUE impaired  LUE Sensation: LUE intact  Sensory/ Proprioception/ Stereognosis comments: Patient endorses pre-op N/T in R hand/UE, denies N/T on evaluation. Patient reports reduced light tough senstion on R UE.    Balance:  Good static/dynamic standing balance w/o use of AD, Good static/dynamic sitting balance EOB    Functional Mobility  Transfer Assistance Needed: Yes  Transfers - Needs Assistance: Standby assist  Bed Mobility Assistance Needed: Yes  Bed Mobility - Needs Assistance: Standby assist (HOB elevated)  Ambulation: SBA for steps x 2' to the bedside chair w/o use of AD, no LOB noted      ADLs  ADLs:   ADL Level of assistance   Feeding Set-up assist    Upper body dressing Standby assist   Lower body dressing Min assist   Grooming  Standby assist   Toileting  Min assist   Bathing Min assist   *Level of function perceived and observed for all ADLs    IADLs: Not tested      Vitals / Orthostatics  At Rest: VSS  With Activity: VSS      Medical Staff Made Aware: RN consulted/updated      Occupational Therapy Session Duration  OT Individual [mins]: 53         I attest that I have reviewed the above information.  Signed: Haig Prophet, OT  Filed 11/17/2021

## 2021-11-17 NOTE — Unmapped (Signed)
VSS. A&Ox4.  Weaned to RA. No BM, UOP adequate. Ambulated in hallway. Sitting up in chair. Incentive spirometry self-administered. Chesttube to waterseal. Pain managed w/ PCA pump, scheduled meds, & PRNs. Visitors at bedside. Patient in bed, watching television.      Problem: Impaired Wound Healing  Goal: Optimal Wound Healing  Outcome: Progressing  Intervention: Promote Wound Healing  Recent Flowsheet Documentation  Taken 11/17/2021 0800 by Hyman Bower, RN  Activity Management: activity adjusted per tolerance     Problem: Latex Allergy  Goal: Absence of Allergy Symptoms  Outcome: Progressing     Problem: Adult Inpatient Plan of Care  Goal: Plan of Care Review  Outcome: Progressing  Goal: Patient-Specific Goal (Individualized)  Outcome: Progressing  Goal: Absence of Hospital-Acquired Illness or Injury  Outcome: Progressing  Intervention: Identify and Manage Fall Risk  Recent Flowsheet Documentation  Taken 11/17/2021 0800 by Hyman Bower, RN  Safety Interventions:  ??? fall reduction program maintained  ??? lighting adjusted for tasks/safety  ??? low bed  ??? nonskid shoes/slippers when out of bed  Intervention: Prevent Skin Injury  Recent Flowsheet Documentation  Taken 11/17/2021 1600 by Hyman Bower, RN  Skin Protection: adhesive use limited  Taken 11/17/2021 1200 by Hyman Bower, RN  Skin Protection: adhesive use limited  Taken 11/17/2021 0800 by Hyman Bower, RN  Skin Protection: adhesive use limited  Intervention: Prevent and Manage VTE (Venous Thromboembolism) Risk  Recent Flowsheet Documentation  Taken 11/17/2021 0800 by Hyman Bower, RN  Activity Management: activity adjusted per tolerance  Goal: Optimal Comfort and Wellbeing  Outcome: Progressing  Goal: Readiness for Transition of Care  Outcome: Progressing  Goal: Rounds/Family Conference  Outcome: Progressing     Problem: Fall Injury Risk  Goal: Absence of Fall and Fall-Related Injury  Outcome: Progressing  Intervention: Promote Injury-Free Environment  Recent Flowsheet Documentation  Taken 11/17/2021 0800 by Hyman Bower, RN  Safety Interventions:  ??? fall reduction program maintained  ??? lighting adjusted for tasks/safety  ??? low bed  ??? nonskid shoes/slippers when out of bed     Problem: Pain Acute  Goal: Acceptable Pain Control and Functional Ability  Outcome: Progressing

## 2021-11-17 NOTE — Unmapped (Signed)
VSS. NSR. A&Ox4. 2L Vincent Waters. Chest tube -20 suction. Patient from PACU. Visitors at bedside. PCA pump utilized. Dinner ordered. No BM, UOP adequate. Incentive spirometry education provided. Patient in bed, watching television.      Problem: Impaired Wound Healing  Goal: Optimal Wound Healing  Outcome: Progressing  Intervention: Promote Wound Healing  Recent Flowsheet Documentation  Taken 11/16/2021 1800 by Hyman Bower, RN  Activity Management: activity adjusted per tolerance  Taken 11/16/2021 1656 by Hyman Bower, RN  Activity Management: activity adjusted per tolerance     Problem: Latex Allergy  Goal: Absence of Allergy Symptoms  Outcome: Progressing     Problem: Adult Inpatient Plan of Care  Goal: Plan of Care Review  Outcome: Progressing  Goal: Patient-Specific Goal (Individualized)  Outcome: Progressing  Goal: Absence of Hospital-Acquired Illness or Injury  Outcome: Progressing  Intervention: Identify and Manage Fall Risk  Recent Flowsheet Documentation  Taken 11/16/2021 1656 by Hyman Bower, RN  Safety Interventions:  ??? bleeding precautions  ??? fall reduction program maintained  ??? family at bedside  ??? lighting adjusted for tasks/safety  ??? low bed  ??? nonskid shoes/slippers when out of bed  Intervention: Prevent Skin Injury  Recent Flowsheet Documentation  Taken 11/16/2021 1800 by Hyman Bower, RN  Skin Protection: adhesive use limited  Taken 11/16/2021 1700 by Hyman Bower, RN  Skin Protection: adhesive use limited  Intervention: Prevent and Manage VTE (Venous Thromboembolism) Risk  Recent Flowsheet Documentation  Taken 11/16/2021 1800 by Hyman Bower, RN  Activity Management: activity adjusted per tolerance  Taken 11/16/2021 1656 by Hyman Bower, RN  Activity Management: activity adjusted per tolerance  Goal: Optimal Comfort and Wellbeing  Outcome: Progressing  Goal: Readiness for Transition of Care  Outcome: Progressing  Goal: Rounds/Family Conference  Outcome: Progressing

## 2021-11-17 NOTE — Unmapped (Addendum)
Vincent Waters is a 41 y.o. 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 The patient was taken to the OR on 11/16/2021 for a Right TOS decompression, right pec minor release. Intraoperatively there was a pleural violation noted for which R chest tube was placed via supraclavicular incision.  He tolerated the procedure well, was extubated in the OR, and was taken to the PACU where he received routine postoperative care before being transferred to the floor.     He did well postoperatively. Chest tube was removed on POD 2.  His diet was slowly advanced, and at the time of discharge he was tolerating a regular diet. The patient was able to void spontaneously, have his pain controlled with P.O. pain medication, and ambulate with minimal assistance.    He is being discharged on 11/17/21 (POD 2) to home in stable condition with planned outpatient follow-up.

## 2021-11-18 LAB — MAGNESIUM: MAGNESIUM: 2.1 mg/dL (ref 1.6–2.6)

## 2021-11-18 LAB — CBC
HEMATOCRIT: 37.1 % (ref 36.0–46.0)
HEMOGLOBIN: 12.5 g/dL (ref 12.1–15.7)
MEAN CORPUSCULAR HEMOGLOBIN CONC: 33.6 g/dL (ref 32.0–36.0)
MEAN CORPUSCULAR HEMOGLOBIN: 27.2 pg (ref 25.9–32.4)
MEAN CORPUSCULAR VOLUME: 81.1 fL (ref 77.6–95.7)
MEAN PLATELET VOLUME: 7.7 fL (ref 6.8–10.7)
PLATELET COUNT: 254 10*9/L (ref 150–450)
RED BLOOD CELL COUNT: 4.58 10*12/L (ref 4.11–5.36)
RED CELL DISTRIBUTION WIDTH: 13.8 % (ref 12.2–15.2)
WBC ADJUSTED: 7.2 10*9/L (ref 3.6–11.2)

## 2021-11-18 LAB — BASIC METABOLIC PANEL
ANION GAP: 6 mmol/L (ref 5–14)
BLOOD UREA NITROGEN: 7 mg/dL — ABNORMAL LOW (ref 9–23)
BUN / CREAT RATIO: 8
CALCIUM: 8.4 mg/dL — ABNORMAL LOW (ref 8.7–10.4)
CHLORIDE: 104 mmol/L (ref 98–107)
CO2: 29 mmol/L (ref 20.0–31.0)
CREATININE: 0.83 mg/dL — ABNORMAL HIGH
EGFR CKD-EPI (2021) FEMALE: >90 mL/min/{1.73_m2} (ref >=60)
GLUCOSE RANDOM: 115 mg/dL (ref 70–179)
POTASSIUM: 3.4 mmol/L (ref 3.4–4.8)
SODIUM: 139 mmol/L (ref 135–145)

## 2021-11-18 LAB — PHOSPHORUS: PHOSPHORUS: 2.5 mg/dL (ref 2.4–5.1)

## 2021-11-18 MED ADMIN — diazePAM (VALIUM) tablet 5 mg: 5 mg | ORAL | @ 14:00:00

## 2021-11-18 MED ADMIN — diazePAM (VALIUM) tablet 5 mg: 5 mg | ORAL | @ 21:00:00

## 2021-11-18 MED ADMIN — fluticasone furoate-vilanteroL (BREO ELLIPTA) 200-25 mcg/dose inhaler 1 puff: 1 | RESPIRATORY_TRACT | @ 14:00:00

## 2021-11-18 MED ADMIN — famotidine (PEPCID) tablet 40 mg: 40 mg | ORAL | @ 21:00:00

## 2021-11-18 MED ADMIN — oxyCODONE (ROXICODONE) immediate release tablet 5 mg: 5 mg | ORAL | @ 09:00:00 | Stop: 2021-11-30

## 2021-11-18 MED ADMIN — potassium phosphate 30 mmol in sodium chloride (NS) 0.9 % 250 mL infusion: 30 mmol | INTRAVENOUS | @ 13:00:00 | Stop: 2021-11-18

## 2021-11-18 MED ADMIN — acetaminophen (TYLENOL) tablet 1,000 mg: 1000 mg | ORAL | @ 01:00:00

## 2021-11-18 MED ADMIN — gabapentin (NEURONTIN) capsule 300 mg: 300 mg | ORAL

## 2021-11-18 MED ADMIN — cetirizine (ZyrTEC) tablet 10 mg: 10 mg | ORAL | @ 13:00:00

## 2021-11-18 MED ADMIN — oxyCODONE (ROXICODONE) immediate release tablet 5 mg: 5 mg | ORAL | @ 21:00:00 | Stop: 2021-11-30

## 2021-11-18 MED ADMIN — gabapentin (NEURONTIN) capsule 300 mg: 300 mg | ORAL | @ 01:00:00

## 2021-11-18 MED ADMIN — enoxaparin (LOVENOX) syringe 40 mg: 40 mg | SUBCUTANEOUS | @ 13:00:00

## 2021-11-18 MED ADMIN — acetaminophen (TYLENOL) tablet 1,000 mg: 1000 mg | ORAL | @ 18:00:00

## 2021-11-18 MED ADMIN — gabapentin (NEURONTIN) capsule 300 mg: 300 mg | ORAL | @ 13:00:00

## 2021-11-18 MED ADMIN — diazePAM (VALIUM) tablet 5 mg: 5 mg | ORAL | @ 04:00:00

## 2021-11-18 MED ADMIN — diazePAM (VALIUM) tablet 5 mg: 5 mg | ORAL | @ 10:00:00

## 2021-11-18 MED ADMIN — gabapentin (NEURONTIN) capsule 300 mg: 300 mg | ORAL | @ 18:00:00

## 2021-11-18 MED ADMIN — acetaminophen (TYLENOL) tablet 1,000 mg: 1000 mg | ORAL | @ 10:00:00

## 2021-11-18 MED ADMIN — oxyCODONE (ROXICODONE) immediate release tablet 5 mg: 5 mg | ORAL | @ 18:00:00 | Stop: 2021-11-30

## 2021-11-18 MED ADMIN — oxyCODONE (ROXICODONE) immediate release tablet 5 mg: 5 mg | ORAL | @ 23:00:00 | Stop: 2021-11-30

## 2021-11-18 NOTE — Unmapped (Signed)
NSR/ST per tele. Room air to 2 L via [place] while sleeping. Slightly lower BP around 2315, MD paged and aware, RN encouraged patient to drink more fluids. BP rechecked approx 30 min later and now normalized. Voiding. Chest tube to WS with minimal output. Pain managed with scheduled meds and dPCA. Patient resting in bed at this time.      Problem: Impaired Wound Healing  Goal: Optimal Wound Healing  Outcome: Progressing     Problem: Latex Allergy  Goal: Absence of Allergy Symptoms  Outcome: Progressing     Problem: Adult Inpatient Plan of Care  Goal: Plan of Care Review  Outcome: Progressing  Goal: Patient-Specific Goal (Individualized)  Outcome: Progressing  Goal: Absence of Hospital-Acquired Illness or Injury  Outcome: Progressing  Intervention: Identify and Manage Fall Risk  Recent Flowsheet Documentation  Taken 11/17/2021 2000 by Burnadette Peter, RN  Safety Interventions:   lighting adjusted for tasks/safety   low bed   nonskid shoes/slippers when out of bed  Intervention: Prevent Skin Injury  Recent Flowsheet Documentation  Taken 11/17/2021 2000 by Burnadette Peter, RN  Skin Protection:   adhesive use limited   tubing/devices free from skin contact  Goal: Optimal Comfort and Wellbeing  Outcome: Progressing  Goal: Readiness for Transition of Care  Outcome: Progressing  Goal: Rounds/Family Conference  Outcome: Progressing     Problem: Fall Injury Risk  Goal: Absence of Fall and Fall-Related Injury  Outcome: Progressing  Intervention: Promote Scientist, clinical (histocompatibility and immunogenetics) Documentation  Taken 11/17/2021 2000 by Burnadette Peter, RN  Safety Interventions:   lighting adjusted for tasks/safety   low bed   nonskid shoes/slippers when out of bed     Problem: Pain Acute  Goal: Acceptable Pain Control and Functional Ability  Outcome: Progressing     Problem: Self-Care Deficit  Goal: Improved Ability to Complete Activities of Daily Living  Outcome: Progressing

## 2021-11-18 NOTE — Unmapped (Signed)
""  VENOUS ACCESS TEAM PROCEDURE    Order was placed for a \""PIV by Venous Access Team (VAT)\"".  Patient was assessed at bedside for placement of a PIV. PPE were donned per protocol.  Access was obtained. Blood return noted.  Dressing intact and device well secured.  Flushed with normal saline.  See LDA for details.  Pt advised to inform RN of any s/s of discomfort at the PIV site.    Workup / Procedure Time:  30 minutes      RN was notified.       Thank you,     Temika Sutphin RN Venous Access Team""

## 2021-11-18 NOTE — Unmapped (Signed)
Surgery Progress Note    Service Date: 11/18/2021  Admit Date: 11/16/2021, Hospital Day: 3  Hospital Service: Surg Vascular (SRV)  Attending: Thora Lance, MD    Assessment     Vincent Waters is a 41 y.o. 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 Progressing favorably.    2 Days Post-Op s/p R TOS decompression, pec minor release.    Interval Events/Subjective   No acute events overnight. Pain controlled overnight, prn oxy 5mg  x2 yesterday and dPCA. On 2L [place] overnight. Endorses use of CPAP/BiPAP at home. Will ask RT to assist [place] with restarting tonight.    Plan     Neuro/Pain:  - Multimodal pain regimen  - Anti-emetics prn  - dPCA, will DC once CT is pulled      CV/Pulm:  - Hemodynamically stable  - Chest tube to suction, no air leaks   - Repeat CXR this morning pending   - Will consider CT to waterseal    FEN/GI:  Fluids: medlocked  Electrolytes: replete prn  Nutrition: Nutrition Therapy Regular/House  - Scheduled bowel reg    GU/Renal:  - Voiding spontaneously     Heme/ID:  - HGB stable  - Lovenox DVT ppx     - OT 3x weekly    Disposition: Floor status.     Please page SRV [phone] with questions or concerns.      Objective     Vitals:   Temp:  [36.8 ??C (98.2 ??F)-36.9 ??C (98.4 ??F)] 36.9 ??C (98.4 ??F)  Heart Rate:  [91-107] 96  SpO2 Pulse:  [85-108] 95  Resp:  [16-19] 18  BP: (98-119)/(51-72) 103/52  MAP (mmHg):  [64-85] 68  SpO2:  [92 %-94 %] 93 %    Input/Output:  I/O last 3 completed shifts:  In: 2012.4 [P.O.:2010; I.V.:2.4]  Out: 1676 [Urine:1476; Drains:200]    Physical Exam:  -General: Appropriate, comfortable and in no apparent distress.   -Neurological: Alert and oriented x3.  -Cardiovascular: HDS  -Pulmonary: Normal work of breathing. CT to suction with no air leak  -Abdomen: Soft, non-tender, non-distended. No rebound or guarding. Incisions c/d/I.  -GU: No foley in place  -MSK: Moves all 4 extremities spontaneously.   -Extremities: Warm, well perfused. Recent Imaging:  XR Chest Portable    Result Date: 11/17/2021  EXAM: XR CHEST PORTABLE DATE: 11/17/2021 5:45 AM ACCESSION: [id number] UN DICTATED: 11/17/2021 9:37 AM INTERPRETATION LOCATION: MAIN CAMPUS     CLINICAL DATA:     POSTSURGICAL STATUS     COMPARISON:     11/16/2021     TECHNIQUE:     XR CHEST PORTABLE     FINDINGS:     Post right first rib resection. Right pleural drain in place. Bandage artifact obscures superior right hemithorax. Right nuchal emphysema. Unchanged moderate hematoma right neck which slightly effaces trachea to the left. Vascular congestion and perihilar haze.             No overt pneumothorax. Portion of right hemithorax obscured by bandage artifact.     Unchanged right neck hematoma.    XR Chest Portable    Result Date: 11/17/2021  EXAM: XR CHEST PORTABLE DATE: 11/17/2021 8:21 AM ACCESSION: [id number] UN DICTATED: 11/17/2021 8:24 AM INTERPRETATION LOCATION: MAIN CAMPUS     CLINICAL DATA:     PNEUMOTHORAX     COMPARISON:     11/17/2021     TECHNIQUE:     XR CHEST PORTABLE     FINDINGS:  Post right first rib resection. Right pleural drain. No pneumothorax. Mild superficial soft tissue emphysema right neck. Mild atelectasis. No air space opacity, effusion, or pneumothorax.             As above.    XR Chest Portable    Result Date: 11/16/2021  EXAM: XR CHEST PORTABLE DATE: 11/16/2021 2:56 PM ACCESSION: [id number] UN DICTATED: 11/16/2021 3:00 PM INTERPRETATION LOCATION: MAIN CAMPUS     CLINICAL DATA:     PNEUMOTHORAX     COMPARISON:     09/27/2021     TECHNIQUE:     XR CHEST PORTABLE     FINDINGS:     Post right first rib resection. Mild soft tissue emphysema right neck. No overt pneumothorax. Mild bilateral atelectasis. Right pleural drain.             Post right first rib dissection.        Labs:  Lab Results   Component Value Date    WBC 7.2 11/18/2021    HGB 12.5 11/18/2021    HCT 37.1 11/18/2021    PLT 254 11/18/2021       Lab Results   Component Value Date    NA 139 11/18/2021    K 3.4 11/18/2021    CL 104 11/18/2021    CO2 29.0 11/18/2021    BUN 7 (L) 11/18/2021    CREATININE 0.83 (H) 11/18/2021    GLU 115 11/18/2021    CALCIUM 8.4 (L) 11/18/2021    MG 2.1 11/18/2021    PHOS 2.5 11/18/2021         [name]  General Surgery, PGY-1

## 2021-11-18 NOTE — Unmapped (Signed)
AOX4. Room air. Ambulating independently. Dressing over chest tube site CDI. Surgical incision sites covered with topical adhesive. Neurovascular checks to upper extremities with Q4 vital signs. Patient medicated per MAR with positive effect. Will continue to monitor.   Problem: Impaired Wound Healing  Goal: Optimal Wound Healing  Outcome: Progressing  Intervention: Promote Wound Healing  Recent Flowsheet Documentation  Taken 11/18/2021 1400 by Lavella Lemons, RN  Activity Management: activity adjusted per tolerance     Problem: Latex Allergy  Goal: Absence of Allergy Symptoms  Outcome: Progressing     Problem: Adult Inpatient Plan of Care  Goal: Plan of Care Review  Outcome: Progressing  Goal: Patient-Specific Goal (Individualized)  Outcome: Progressing  Goal: Absence of Hospital-Acquired Illness or Injury  Outcome: Progressing  Intervention: Identify and Manage Fall Risk  Recent Flowsheet Documentation  Taken 11/18/2021 1400 by Lavella Lemons, RN  Safety Interventions:   lighting adjusted for tasks/safety   low bed   nonskid shoes/slippers when out of bed  Intervention: Prevent Skin Injury  Recent Flowsheet Documentation  Taken 11/18/2021 1400 by Lavella Lemons, RN  Skin Protection: adhesive use limited  Intervention: Prevent and Manage VTE (Venous Thromboembolism) Risk  Recent Flowsheet Documentation  Taken 11/18/2021 1400 by Lavella Lemons, RN  Activity Management: activity adjusted per tolerance  VTE Prevention/Management: ambulation promoted  Goal: Optimal Comfort and Wellbeing  Outcome: Progressing  Goal: Readiness for Transition of Care  Outcome: Progressing  Goal: Rounds/Family Conference  Outcome: Progressing     Problem: Fall Injury Risk  Goal: Absence of Fall and Fall-Related Injury  Outcome: Progressing  Intervention: Promote Injury-Free Environment  Recent Flowsheet Documentation  Taken 11/18/2021 1400 by Lavella Lemons, RN  Safety Interventions:   lighting adjusted for tasks/safety   low bed nonskid shoes/slippers when out of bed     Problem: Pain Acute  Goal: Acceptable Pain Control and Functional Ability  Outcome: Progressing     Problem: Self-Care Deficit  Goal: Improved Ability to Complete Activities of Daily Living  Outcome: Progressing

## 2021-11-19 LAB — BASIC METABOLIC PANEL
ANION GAP: 9 mmol/L (ref 5–14)
BLOOD UREA NITROGEN: <5 mg/dL — ABNORMAL LOW (ref 9–23)
CALCIUM: 8.9 mg/dL (ref 8.7–10.4)
CHLORIDE: 103 mmol/L (ref 98–107)
CO2: 28 mmol/L (ref 20.0–31.0)
CREATININE: 0.8 mg/dL
EGFR CKD-EPI (2021) MALE: >90 mL/min/{1.73_m2} (ref >=60)
GLUCOSE RANDOM: 105 mg/dL (ref 70–179)
POTASSIUM: 3.8 mmol/L (ref 3.4–4.8)
SODIUM: 140 mmol/L (ref 135–145)

## 2021-11-19 LAB — PHOSPHORUS: PHOSPHORUS: 3.9 mg/dL (ref 2.4–5.1)

## 2021-11-19 LAB — MAGNESIUM: MAGNESIUM: 1.9 mg/dL (ref 1.6–2.6)

## 2021-11-19 LAB — CBC
HEMATOCRIT: 37.7 % (ref 36.0–46.0)
HEMOGLOBIN: 12.9 g/dL (ref 12.1–15.7)
MEAN CORPUSCULAR HEMOGLOBIN CONC: 34.1 g/dL (ref 32.0–36.0)
MEAN CORPUSCULAR HEMOGLOBIN: 27.5 pg (ref 25.9–32.4)
MEAN CORPUSCULAR VOLUME: 80.7 fL (ref 77.6–95.7)
MEAN PLATELET VOLUME: 7.5 fL (ref 6.8–10.7)
PLATELET COUNT: 272 10*9/L (ref 150–450)
RED BLOOD CELL COUNT: 4.67 10*12/L (ref 4.11–5.36)
RED CELL DISTRIBUTION WIDTH: 14 % (ref 12.2–15.2)
WBC ADJUSTED: 5.2 10*9/L (ref 3.6–11.2)

## 2021-11-19 MED ORDER — POLYETHYLENE GLYCOL 3350 17 GRAM/DOSE ORAL POWDER
Freq: Every day | ORAL | 0 refills | 28 days | Status: CP
Start: 2021-11-19 — End: 2021-12-19

## 2021-11-19 MED ORDER — DIAZEPAM 5 MG TABLET
ORAL_TABLET | Freq: Four times a day (QID) | ORAL | 0 refills | 11.00000 days | Status: CP | PRN
Start: 2021-11-19 — End: 2021-12-03
  Filled 2021-11-19: qty 20, 5d supply, fill #0

## 2021-11-19 MED ORDER — OXYCODONE 5 MG TABLET
ORAL_TABLET | ORAL | 0 refills | 7 days | Status: CP | PRN
Start: 2021-11-19 — End: 2021-12-03
  Filled 2021-11-19: qty 42, 7d supply, fill #0

## 2021-11-19 MED ADMIN — enoxaparin (LOVENOX) syringe 40 mg: 40 mg | SUBCUTANEOUS | @ 13:00:00 | Stop: 2021-11-19

## 2021-11-19 MED ADMIN — emtricitabine-tenofovir (TDF) (TRUVADA) 200-300 mg per tablet 1 tablet: 1 | ORAL | @ 13:00:00 | Stop: 2021-11-19

## 2021-11-19 MED ADMIN — cetirizine (ZyrTEC) tablet 10 mg: 10 mg | ORAL | @ 13:00:00 | Stop: 2021-11-19

## 2021-11-19 MED ADMIN — acetaminophen (TYLENOL) tablet 1,000 mg: 1000 mg | ORAL | @ 09:00:00 | Stop: 2021-11-19

## 2021-11-19 MED ADMIN — oxyCODONE (ROXICODONE) immediate release tablet 10 mg: 10 mg | ORAL | @ 04:00:00 | Stop: 2021-11-30

## 2021-11-19 MED ADMIN — diazePAM (VALIUM) tablet 5 mg: 5 mg | ORAL | @ 02:00:00

## 2021-11-19 MED ADMIN — gabapentin (NEURONTIN) capsule 300 mg: 300 mg | ORAL | @ 13:00:00 | Stop: 2021-11-19

## 2021-11-19 MED ADMIN — diazePAM (VALIUM) tablet 5 mg: 5 mg | ORAL | @ 09:00:00 | Stop: 2021-11-19

## 2021-11-19 MED ADMIN — oxyCODONE (ROXICODONE) immediate release tablet 10 mg: 10 mg | ORAL | @ 13:00:00 | Stop: 2021-11-19

## 2021-11-19 MED ADMIN — acetaminophen (TYLENOL) tablet 1,000 mg: 1000 mg | ORAL | @ 02:00:00

## 2021-11-19 MED ADMIN — oxyCODONE (ROXICODONE) immediate release tablet 10 mg: 10 mg | ORAL | @ 09:00:00 | Stop: 2021-11-19

## 2021-11-19 NOTE — Unmapped (Signed)
Discharge Summary    Admit date: 11/16/2021    Discharge date and time: 11/19/21    Discharge to:  Home    Discharge Service: Surg Vascular (SRV)    Discharge Attending Physician: Thora Lance, MD    Discharge  Diagnoses: Thoracic outlet syndrome     Secondary Diagnosis: Active Problems:    * No active hospital problems. *  Resolved Problems:    * No resolved hospital problems. *      OR Procedures:    Right - RIGHT TOS DECOMPRESSION  Right - RIGHT PEC MINOR RELEASE  Date  11/16/2021  -------------------     Ancillary Procedures: no procedures    Discharge Day Services: The patient was seen and examined by the Vascular Surgery team on the day of discharge. Vital signs and laboratory values were stable and deemed appropriate for discharge. Surgical wounds were examined and found to be clean, dry, and intact. Discharge plan was discussed, instructions for home care were given, and all questions answered.      Subjective   No acute events overnight. Pain Controlled. No fever or chills.    Objective   Patient Vitals for the past 8 hrs:   BP Temp Temp src Pulse Resp SpO2   11/19/21 0439 106/62 36.8 ??C (98.2 ??F) Oral 80 17 94 %   11/19/21 0027 112/60 36.5 ??C (97.7 ??F) Oral 100 18 94 %     No intake/output data recorded.    Physical Exam:  -General: Appropriate, comfortable and in no apparent distress.   -Neurological: Alert and oriented x3.  -Cardiovascular: HDS  -Pulmonary: Normal work of breathing. CT to suction with no air leak  -Abdomen: Soft, non-tender, non-distended. No rebound or guarding. Incisions c/d/I.  -GU: No foley in place  -MSK: Moves all 4 extremities spontaneously.   -Extremities: Warm, well perfused.     Hospital Course:  Vincent Waters is a 41 y.o. 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 The patient was taken to the OR on 11/16/2021 for a Right TOS decompression, right pec minor release. Intraoperatively there was a pleural violation noted for which R chest tube was placed via supraclavicular incision.  He tolerated the procedure well, was extubated in the OR, and was taken to the PACU where he received routine postoperative care before being transferred to the floor.     He did well postoperatively. Chest tube was removed on POD 2.  His diet was slowly advanced, and at the time of discharge he was tolerating a regular diet. The patient was able to void spontaneously, have his pain controlled with P.O. pain medication, and ambulate with minimal assistance.    He is being discharged on 11/17/21 (POD 2) to home in stable condition with planned outpatient follow-up.        Condition at Discharge: Improved  Discharge Medications:      Medication List      START taking these medications    ??? diazePAM 5 MG tablet; Commonly known as: VALIUM; Take 1 tablet (5 mg   total) by mouth every six (6) hours as needed for muscle spasms for up to   7 days.  ??? polyethylene glycol 17 gram packet; Commonly known as: MIRALAX; Take 17   g by mouth daily.     CONTINUE taking these medications    ??? AEROCHAMBER PLUS FLOW-VU Spcr; Generic drug: inhalational spacing  device; Use as directed with albuterol and symbicort  ??? azelastine 137 mcg (0.1 %) nasal spray; Commonly known as: ASTELIN;   Instill 2 sprays into each nostril two (2) times a day as needed for   rhinitis.  ??? BD REGULAR BEVEL NEEDLES 25 [place] x 5/8 Ndle; Generic drug: needle   (disp) 25 [place]; For subcutaneous hormone injection.  ??? BD SAFETYGLIDE NEEDLE 18 Semir x 1 1/2 Ndle; Generic drug: safety   needles; For drawing hormone injection  ??? cetirizine 10 MG tablet; Commonly known as: ZyrTEC; Take 1 tablet (10 mg   total) by mouth daily.  ??? clindamycin 1 % lotion; Commonly known as: CLEOCIN T  ??? dextroamphetamine-amphetamine 20 mg tablet; Commonly known as: AdderalL;   Take 1 tablet (20 mg total) by mouth daily as needed.  ??? diclofenac sodium 1 % gel; Commonly known as: VOLTAREN; Apply 2 g   topically four (4) times a day.  ??? EASY TOUCH LUER LOCK SYRINGE 1 mL Syrg; Generic drug: syringe   (disposable); Use for weekly hormone injection.  ??? emtricitabine-tenofovir (TDF) 200-300 mg per tablet; Commonly known as:   TRUVADA; Take 1 tablet by mouth daily.  ??? EPINEPHrine 0.3 mg/0.3 mL injection; Commonly known as: EPIPEN; Inject   0.3 mL (0.3 mg total) under the skin once for 1 dose.  ??? famotidine 40 MG tablet; Commonly known as: PEPCID; Take 1 tablet (40 mg   total) by mouth every evening.  ??? gabapentin 250 mg/5 mL oral solution; Commonly known as: NEURONTIN; Take   10 mL (500 mg total) by mouth Two (2) times a day for 20 days. After one   week, reduce to 10 mL (500 mg) once nightly. After another week, reduce to   5 mL (250 mg) once nightly. After another week, discontinue altogether.  ??? nifedipine 0.3% lidocaine 1.5% in petrolatum ointment; Apply a pea-size   amount to anal area 3 times a day  ??? oxyCODONE-acetaminophen 5-325 mg per tablet; Commonly known as:   PERCOCET; Take 1 tablet by mouth two (2) times a day as needed for pain   for up to 26 days.  ??? pantoprazole 20 MG tablet; Commonly known as: PROTONIX; Take 1 tablet   (20 mg total) by mouth daily.  ??? SYMBICORT 160-4.5 mcg/actuation inhaler; Generic drug:   budesonide-formoteroL; Inhale 1 puff by mouth Two (2) times a day.  ??? syringe (disposable) 2.5 mL Syrg; Use weekly  ??? testosterone cypionate 200 mg/mL injection; Commonly known as:   DEPOTESTOTERONE CYPIONATE; Inject 0.2 mL (40 mg total) under the skin   every seven (7) days.  ??? triamcinolone 0.1 % cream; Commonly known as: KENALOG; Apply topically   Two (2) times a day.  ??? TRULANCE 3 mg Tab; Generic drug: plecanatide; Take one tablet by mouth   every day     STOP taking these medications    ??? clonazePAM 0.5 MG tablet; Commonly known as: KlonoPIN  ??? XOLAIR 150 mg/mL syringe; Generic drug: omalizumab       Pending Test Results:     Discharge Instructions:  Activity:     Diet:    Other Instructions:  Other Instructions     Discharge instructions      1) Dial 911 for emergencies.     2) Mon-Fri, 9am-5pm, call the Vascular Surgery clinic at [phone] for:     - increasing pain in shoulder, arm or hand     - fever > 101 F, chills, or flu-like symptoms     -  signs or symptoms of infection at incision site such as increased redness, swelling, pain, or drainage     3) For emergencies after-hours: call the [facility] operator [phone]) to page the General Surgery resident on call (your question will be directed to a surgery resident who is not immediately aware of the details of your case, but can help you deal with any emergencies that cannot wait until regular business hours).    Please contact for and go to the nearest emergency room for:     - new shortness of breath     - rapidly spreading redness/warmth around your surgical site     4) You may shower 48 hours after surgery, do not scrub surgical sites. Surgical glue will dissolve over time, you may trim any pieces that become loose. The clear band aid may be removed Saturday.    5) CONTINUE TAKING HOME OXYCODONE left ear PRESCRIBED. If your pain is not controlled, you have been prescribed a narcotic pain medication if necessary. Do not drive while taking narcotic pain medications.    Please start to taper your pain medication and Valium over the next 7 days.    DO NOT TAKE VALIUM AND YOUR HOME KLONOPIN. HOLD HOME KLONOPIN WHILE ON VALIUM.     Continue a stool softener at home while taking narcotic medication to avoid constipation (hold for diarrhea or loose stools).     It is VERY important to continue using the incentive spirometer and deep breathing exercises at home to keep your lungs healthy.     Please continue arm therapy exercises at home to maintain good range of motion. You may continue occupational therapy immediately after discharge. Do not lift >15lbs until you are evaluated in clinic at your next appointment.    You will be scheduled for a follow-up appointment in 2 weeks. Prior to your appointment you will have a chest x-ray.        Labs and Other Follow-ups after Discharge:      Future Appointments:  Appointments which have been scheduled for you    Nov 23, 2021  9:30 AM  (Arrive by 9:20 AM)  RETURN BEHAVIORAL HEALTH with Mena Goes, PsyD  [facility] FAMILY MEDICINE [place] [facility] REGION) [address]  [place] [address]  [phone]      Nov 23, 2021  2:15 PM  (Arrive by 2:05 PM)  RETURN CONTINUITY with Kern Reap, MD  [facility] FAMILY MEDICINE [place] [facility] REGION) [address]  [place] [address]  [phone]      Nov 29, 2021  3:00 PM  (Arrive by 2:45 PM)  RETURN  ALLERGY with [name], MD  [facility] ALLERGY [place] [facility] REGION) [address]  [place] [address]  [phone]      Dec 01, 2021 10:15 AM  (Arrive by 10:00 AM)  RETURN  VASCULAR with Victoriano Lain  [facility] HEART VASCULAR CTR SURGERY [place] [facility] REGION) [address]  [place] [address]  [phone]      Dec 01, 2021  1:40 PM  (Arrive by 1:30 PM)  RETURN CONTINUITY with Kern Reap, MD  [facility] FAMILY MEDICINE [place] [facility] REGION) [address]  [place] [address]  [phone]      Dec 15, 2021  2:55 PM  (Arrive by 2:45 PM)  RETURN CONTINUITY with Jamse Arn  Arlyce Dice, MD  [facility] FAMILY MEDICINE [place] [facility] REGION) [address]  [place] [address]  [phone]      Dec 30, 2021  9:45 AM  (Arrive by 9:30 AM)  RETURN  VASCULAR with Thora Lance, MD  [facility] HEART VASCULAR CTR SURGERY [place] [address] [place] [facility] REGION) [address]  [place] [address]  [phone]      Feb 25, 2022  8:00 AM  (Arrive by 7:45 AM)  NEW GENERAL PCP with Scherrie Gerlach, MD  [place] [facility] REGION) [address]  [place] [address]  [phone]

## 2021-11-19 NOTE — Unmapped (Signed)
Pt resting comfortably in bed, eating dinner. Pain managed with PO meds and ice packs. Pt denies numbness in RUE, but does endorse some tightness. Pulses palpable. Elevated RUE on pillows. Chest incisions are clean, no drainage.   Problem: Impaired Wound Healing  Goal: Optimal Wound Healing  Outcome: Ongoing - Unchanged     Problem: Latex Allergy  Goal: Absence of Allergy Symptoms  Outcome: Ongoing - Unchanged     Problem: Adult Inpatient Plan of Care  Goal: Plan of Care Review  Outcome: Ongoing - Unchanged  Goal: Patient-Specific Goal (Individualized)  Outcome: Ongoing - Unchanged  Goal: Absence of Hospital-Acquired Illness or Injury  Outcome: Ongoing - Unchanged  Intervention: Prevent and Manage VTE (Venous Thromboembolism) Risk  Recent Flowsheet Documentation  Taken 11/18/2021 1950 by Katheren Puller, RN  VTE Prevention/Management:   ambulation promoted   anticoagulant therapy   fluids promoted  Goal: Optimal Comfort and Wellbeing  Outcome: Ongoing - Unchanged  Goal: Readiness for Transition of Care  Outcome: Ongoing - Unchanged  Goal: Rounds/Family Conference  Outcome: Ongoing - Unchanged     Problem: Fall Injury Risk  Goal: Absence of Fall and Fall-Related Injury  Outcome: Ongoing - Unchanged     Problem: Pain Acute  Goal: Acceptable Pain Control and Functional Ability  Outcome: Ongoing - Unchanged     Problem: Self-Care Deficit  Goal: Improved Ability to Complete Activities of Daily Living  Outcome: Ongoing - Unchanged

## 2021-11-26 DIAGNOSIS — J309 Allergic rhinitis, unspecified: Principal | ICD-10-CM

## 2021-11-26 DIAGNOSIS — J3089 Other allergic rhinitis: Principal | ICD-10-CM

## 2021-11-26 DIAGNOSIS — L508 Other urticaria: Principal | ICD-10-CM

## 2021-11-26 MED ORDER — XOLAIR 150 MG/ML SUBCUTANEOUS SYRINGE
SUBCUTANEOUS | 12 refills | 28 days
Start: 2021-11-26 — End: ?

## 2021-11-26 NOTE — Unmapped (Signed)
[facility] Administered Medication Refill Coordination Note      NAME:Johnthomas [facility] DOB: 06/04/1981      Medication: Xolair    Day Supply: 28 days      SHIPPING      Next delivery from [facility] [phone]) to [facility] for [facility] is scheduled for 06/14.    Clinic contact: Teodoro Kil    Patient's next nurse visit for administration: 06/12.-clinic has dose     We will follow up with clinic monthly for standard refill processing and delivery.      Wesley Ducre Samella Parr  Specialty Pharmacy Technician

## 2021-11-29 MED ORDER — XOLAIR 150 MG/ML SUBCUTANEOUS SYRINGE
SUBCUTANEOUS | 12 refills | 28 days | Status: CP
Start: 2021-11-29 — End: ?
  Filled 2021-12-01: qty 2, 28d supply, fill #0

## 2021-11-29 NOTE — Unmapped (Unsigned)
Allergy and Immunology Clinic    Chief complaint: follow up of hives, asthma, allergic rhinitis    Assessment and Plan:   [name] Vincent Waters) is a 41 y.o. transgender male with anxiety and migraine headaches who was seen for follow-up visit for allergies, asthma, and chronic urticaria.    Allergic rhinoconjunctivitis, sensitized to dust mite and mold:   Vincent Waters reports symptoms of ocular itchiness, post nasal drip. For his ocular symptoms, we recommend use of Pataday 1 drop per eye daily with the use of an eye lubricant. Also recommended Allegra as below. I also discussed restarting Flonase sensimist 1 squirts in each nostril BID. Went over proper technique. Also recommended Astelin 1 spray twice a day for additional control. He will also do daily nasal rinses prior to nasal sprays. Hopeful that Xolair, as below, will also help with allergic rhinitis.    Chronic urticaria, poorly controlled:   Vincent Waters's clinical history is consistent with chronic urticaria. Chronic idiopathic urticaria (or chronic spontaneous urticaria) is characterized by recurrent, fleeting episodes of urticaria, with or without angioedema, on most days of the week. In the majority of cases, no external cause can be identified. Last visit labs were unremarkable except for a low TSH. He will follow this up with his PCP and endocrinologist . His hives were previously well controlled with Xolair. We will work on restarting Xolair 300mg  q28 days as he is intolerant to high dose Zyrtec secondary to side effects of dizziness and drowsiness. In the meantime, he will start Allegra 2 pills in the morning, 2 pills in the evening.     Moderate-persistent asthma, uncontrolled:   Vincent Waters's asthma is not well controlled. Will step up Symbicort 160-4.5 mcg to 2 puffs BID with chamber. I ordered PFTs to assess his lung function. I am hopeful that Xolair will help with his asthma as it has in the past.    Folliculitis on back  Will prescribe a short course of clinamycin 1%. He will continue f/u with PCP for management    Profuse sweating  Likely related to testosterone therapy. He will follow up with PCP or endocrinologist regarding potential treatment     No follow-ups on file.    The patient was discussed with Dr. [name] who agrees with the assessment and plan.  --  [name], MD  Allergy/Immunology Fellow  [facility]     Subjective:   Last clinic visit: 05/2020 with [name]    To review his history: Vincent Waters reported itchy and red eyes that occur throughout the day. He reported that his symptoms occur throughout the year. In the past, he tried Primary school teacher but it caused his eyes to burn. His last allergy testing was in 06/2018 and was positive to dust mite and molds. He has tried Flonase but stopped using it due to nasal burning. He has not tried [employer] or Astelin. He reported history of recurrent hives on his arms and buttocks. He reports worsening symptoms with heat. He denies swelling and denied NSAID use. He has tried Careers adviser and Zyrtec in the past. He was previously on Xolair and tolerated this well. He has been off Xolair since around 2019. He reports having hives daily. He denies weight loss or fevers.  In January, he stopped taking Symbicort. He reports having difficulty getting in contact with our clinic. He does report that his asthma was better controlled when he was taking Symbicort. He reports using his rescue inhaler once daily for chest tightness.  Interval history:  -since being on testosterone for 5 years, has been having chronic hives. Also has folliculitis and xerosis. Also sweats all the time, even during the cold weather and this worsens hives. He use to be on Xolair every 2 weeks for 2 years in [place], but moved so had to stop during the transition. Last on it in 2019 and it controlled hives; ever since stopping hives came back. Hives are worse during the cold morning, warm weather in the evening and with showers. He is taking Cetirizine 10mg  twice a day. It helps, but still getting hives. He tried taking 20 mg BID but it caused him to be groggy. Has not tried Careers adviser. He use to be on a lot of prednisone for hives. He gets hives buttocks, arms, chest, groin.     -folliculitis was controlled on Clindamycin, but has run out. Insurance did not cover benzyl peroxide. Wants a prescription. Also tells me he wants a medication for profuse sweating.    -for his asthma, he is on 1 puffs BID Symbicort with a chamber. He had 8-9 episodes of flares with chest tighting and wheezing past 3 months. Responds immediately to Albuterol. Last happened 2 weeks ago. Unclear triggers, sometimes when hives flare. Not with activity. He does not feel limited by his asthma. No steroids or ED visit. No night time symptoms. Tried Singulair in the past, but it did not work. He also felt like his asthma was under better control and his allergies on Xolair.     -allergic rhinitis: positive to mold, dust mite in 2020, IgE 420. Had surgery with ENT 12/2020 (previous surgery 2019). The patient is now s/p septoplasty and inferior turbinate resection on 01/04/2021. Has helped with his breathing, but feels like he gets worsening of post nasal drip. Symptoms all year round. Post nasal drip triggers cough. He is not on nasal sprays anymore after surgery, unsure why he stopped them. Also getting dry eyes that burn. Pataday did not help.    ROS: Pertinent positive and negatives included in the above HPI. All other systems reviewed are negative.    Past Medical/Surgical/Allergy/Immunization/Social/Family History:  Reviewed and updated in Epic since last visit on 09/2018.     Current Outpatient Medications   Medication Sig Dispense Refill   ??? azelastine (ASTELIN) 137 mcg (0.1 %) nasal spray Instill 2 sprays into each nostril two (2) times a day as needed for rhinitis. 30 mL 0   ??? budesonide-formoteroL (SYMBICORT) 160-4.5 mcg/actuation inhaler Inhale 1 puff by mouth Two (2) times a day. 10.2 g 5   ??? cetirizine (ZYRTEC) 10 MG tablet Take 1 tablet (10 mg total) by mouth daily. 30 tablet 12   ??? clindamycin (CLEOCIN T) 1 % lotion 1 application. as needed.     ??? dextroamphetamine-amphetamine (ADDERALL) 20 mg tablet Take 1 tablet (20 mg total) by mouth daily as needed. 10 tablet 0   ??? diazePAM (VALIUM) 5 MG tablet Take 1 tablet (5 mg total) by mouth every six (6) hours as needed for muscle spasms for up to 14 days. 42 tablet 0   ??? diclofenac sodium (VOLTAREN) 1 % gel Apply 2 g topically four (4) times a day. (Patient taking differently: Apply 2 g topically four (4) times a day. DAW 1) 100 g 0   ??? emtricitabine-tenofovir, TDF, (TRUVADA) 200-300 mg per tablet Take 1 tablet by mouth daily. 30 tablet 11   ??? EPINEPHrine (EPIPEN) 0.3 mg/0.3 mL injection Inject 0.3 mL (0.3 mg total) under  the skin once for 1 dose. 1 each 1   ??? famotidine (PEPCID) 40 MG tablet Take 1 tablet (40 mg total) by mouth every evening. 30 tablet 1   ??? gabapentin (NEURONTIN) 250 mg/5 mL oral solution Take 10 mL (500 mg total) by mouth Two (2) times a day for 20 days. After one week, reduce to 10 mL (500 mg) once nightly.  After another week, reduce to 5 mL (250 mg) once nightly.  After another week, discontinue altogether. 400 mL 0   ??? inhalational spacing device Spcr Use as directed with albuterol and symbicort 1 each 1   ??? safety needles (BD SAFETYGLIDE NEEDLE) 18 [name] x 1 1/2 Ndle For drawing hormone injection 25 each 0   ??? needle, disp, 25 Canio (BD REGULAR BEVEL NEEDLES) 25 Zohaib x 5/8 Ndle For subcutaneous hormone injection. 25 each 0   ??? nifedipine 0.3% lidocaine 1.5% in petrolatum ointment Apply a pea-size amount to anal area 3 times a day 100 g 0   ??? oxyCODONE (ROXICODONE) 5 MG immediate release tablet Take 1 tablet (5 mg total) by mouth every four (4) hours as needed for up to 14 days. 42 tablet 0   ??? oxyCODONE-acetaminophen (PERCOCET) 5-325 mg per tablet Take 1 tablet by mouth two (2) times a day as needed for pain for up to 26 days. 52 tablet 0   ??? pantoprazole (PROTONIX) 20 MG tablet Take 1 tablet (20 mg total) by mouth daily. 30 tablet 1   ??? plecanatide (TRULANCE) 3 mg Tab Take one tablet by mouth every day 30 tablet 2   ??? polyethylene glycol (GLYCOLAX) 17 gram/dose powder Mix 1 capful in 4 to 8 ounces of liquid and drink once daily 476 g 0   ??? syringe, disposable, (EASY TOUCH LUER LOCK SYRINGE) 1 mL Syrg Use for weekly hormone injection. 4 each 0   ??? syringe, disposable, 2.5 mL Syrg Use weekly 30 Syringe 2   ??? testosterone cypionate (DEPOTESTOTERONE CYPIONATE) 200 mg/mL injection Inject 0.2 mL (40 mg total) under the skin every seven (7) days. 2 mL 0   ??? triamcinolone (KENALOG) 0.1 % cream Apply topically Two (2) times a day. 15 g 0     Current Facility-Administered Medications   Medication Dose Route Frequency Provider Last Rate Last Admin   ??? omalizumab [name]) injection 300 mg  300 mg Subcutaneous Q28 Days [name], MD   300 mg at 10/22/21 1610       Jerret lives with his girlfriend in [place]. He works at [employer] but is currently on medical leave.    Objective:     Laboratory testing reviewed and pertinent for the following:  Component      Latest Ref Rng & Units 07/16/2018 08/15/2018   Cocklebur IgE      <0.35 kUA/L <0.35    Cat dander IgE      <0.35 kUA/L <0.35    Cottonwood (White Poplar) Tree IgE      <0.35 kUA/L <0.35    Dog Dander IgE      <0.35 kUA/L <0.35    D. farinae IgE      <0.35 kUA/L 0.66 (H)    D. pteronyssinus IgE      <0.35 kUA/L 0.55 (H)    Bahia Grass IgE      <0.35 kUA/L <0.35    Johnson Grass IgE      <0.35 kUA/L <0.35    Timothy Grass IgE      <  0.35 kUA/L <0.35    Alternaria alternata IgE      <0.35 kUA/L 0.39 (H)    Candida Albicans IgE      <0.35 kUA/L 4.49 (H)    Cladosporium Herbarum IgE      <0.35 kUA/L <0.35    Epicoccum purpurascens IgE      <0.35 kUA/L <0.35    Fusarium Proliferatum IgE      <0.35 kUA/L <0.35    Aspergillus fumigatus IgE      <0.35 kUA/L <0.35    Mucor Racemosus IgE      <0.35 kUA/L <0.35    Aspergillus [place] IgE      <0.35 kUA/L <0.35    Mouse IgE      <0.35 kUA/L <0.35    P chrysogenum (P notatum) IgE      <0.35 kUA/L <0.35    Rhizopus Nigrans      <0.35 kUA/L <0.35    Trichophyton rubrum IgE      <0.35 kUA/L <0.35    Setomelanomma rostrata (H. halodes) IgE      <0.35 kUA/L <0.35    Mugwort IgE      <0.35 kUA/L <0.35    Pigweed, Common IgE      <0.35 kUA/L <0.35    English Plantain IgE      <0.35 kUA/L <0.35    Giant Ragweed IgE      <0.35 kUA/L <0.35    German Cockroach IgE      <0.35 kUA/L <0.35    Sheep Sorrel      <0.35 kUA/L <0.35    Ragweed, short (common) IgE      <0.35 kUA/L <0.35    White Ash Tree IgE      <0.35 kUA/L <0.35    Beech (American) tree IgE      <0.35 kUA/L <0.35    Birch (Common Silver) tree IgE      <0.35 kUA/L <0.35    Box Elder Tree IgE      <0.35 kUA/L <0.35    Elm Tree IgE      <0.35 kUA/L <0.35    Maple Leaf Sycamore      <0.35 kUA/L <0.35    White Oak Tree IgE      <0.35 kUA/L <0.35    Pecan Hickory IgE      <0.35 kUA/L <0.35    Walnut Tree IgE      <0.35 kUA/L <0.35    [place] Grass IgE      <0.35 kUA/L <0.35    Goosefoot (Lamb's Quarters) IgE      <0.35 kUA/L <0.35    Willow tree IgE      <0.35 kUA/L <0.35    WBC      4.5 - 11.0 10*9/L 5.1 7.7   RBC      4.00 - 5.20 10*12/L 5.74 (H) 5.98 (H)   HGB      12.0 - 16.0 g/dL 08.6 57.8   HCT      46.9 - 46.0 % 47.1 (H) 48.9 (H)   MCV      80.0 - 100.0 fL 82.1 81.8   MCH      26.0 - 34.0 pg 25.8 (L) 25.8 (L)   MCHC      31.0 - 37.0 g/dL 62.9 52.8   RDW      41.3 - 15.0 % 13.9 14.0   MPV      7.0 - 10.0 fL 6.4 (L) 7.9   Platelet  150 - 440 10*9/L 345 383   Neutrophils %      % 69.2 74.5   Lymphocytes %      % 22.3 18.6   Monocytes %      % 4.8 4.6   Eosinophils %      % 1.3 0.2   Basophils %      % 0.7 0.3   Absolute Neutrophils      2.0 - 7.5 10*9/L 3.6 5.7   Absolute Lymphocytes      1.5 - 5.0 10*9/L 1.1 (L) 1.4 (L)   Absolute Monocytes       0.2 - 0.8 10*9/L 0.2 0.4   Absolute Eosinophils      0.0 - 0.4 10*9/L 0.1 0.0   Absolute Basophils       0.0 - 0.1 10*9/L 0.0 0.0   Large Unstained Cells      0 - 4 % 2 2   Hypochromasia      Not Present  Slight (A)   CD3% (T Cells)      61 - 86 %OfLymphs 74    Absolute CD3 Count      915-3,400 Cells/uL 889 (L)    CD4% (T Helper)      34 - 58 %OfLymphs 44    Absolute CD4 Count      510-2,320 Cells/uL 529    CD8% T Suppressor      12 - 38 %OfLymphs 27    Absolute CD8 Count      180-1,520 Cells/uL 325    CD4:CD8 Ratio      0.9 - 4.8 1.6    CD19% (B Cells)      7 - 23 %OfLymphs 11    Absolute CD19 Count      105 - 920 Cells/uL 132    CD16/56% NK Cell      1 - 27 %OfLymphs 13    Absolute CD16/56      15-1,080 Cells/uL 156    Diphtheria IgG Av Value      IU/mL 0.95    Tetanus IgG Value      IU/mL >2.24    Diphtheria IgG Ab       Positive    Tetanus IgG Ab       Positive    Sed Rate      0 - 20 mm/h 4 1   IgM      35 - 290 mg/dL 161    Total IgG      096-0,454 mg/dL 0,981    IgE, Total      2-214 IU/mL IU/mL 420 (H)    IgA      40.0 - 400.0 mg/dL 191.4    CRP      <78.2 mg/L  <5.0

## 2021-11-30 ENCOUNTER — Institutional Professional Consult (permissible substitution): Admit: 2021-11-30 | Discharge: 2021-12-01 | Payer: PRIVATE HEALTH INSURANCE

## 2021-11-30 ENCOUNTER — Ambulatory Visit: Admit: 2021-11-30 | Discharge: 2021-12-01 | Payer: PRIVATE HEALTH INSURANCE

## 2021-11-30 DIAGNOSIS — L501 Idiopathic urticaria: Principal | ICD-10-CM

## 2021-11-30 MED ADMIN — omalizumab (XOLAIR) injection 300 mg: 300 mg | SUBCUTANEOUS | @ 15:00:00 | Stop: 2022-04-05

## 2021-11-30 NOTE — Unmapped (Signed)
Return TOS    Patient Name: Vincent Waters DOB:[birth year]  Date of Encounter: 11/30/2021    Assessment/Plan:  Patient is a 41 y.o. male s/p right TOS decompression with pec minor release 11/16/21. Patient not doing well post-op with pain control. Medication adjustments made as follows: oxycodone 5-10mg  every 4 hours as needed for pain, diazepm 5mg  BID, recommend taking this at night as his pain is worse in the evening. Will add tizanidine to take during the day, wrote for a 4mg  tablet, recommend to start with 2mg  due to side effects. Medrol dose pack ordered to help with pain. Recommend ice and heat along with volaren gel, biofreeze, or pain patches which are OTC.  RTC in 4 weeks with Dr. Gayla Doss. Ok to start PT with ROM only. 5 pound weight limit for the next 4 weeks. Chest x-ray shows bibasilar subsegmental atelectasis, improved. No pneumothorax visualized. Trace right pleural effusion.       HPI: Vincent Waters is a 41 y.o. 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  Patient having uncontrolled pain post-op.  He has been lifting with his right arm up to 15 pounds per the discharge instructions and feels he has exacerbated his symptoms.  He tried to wean off both oxycodone and diazepam but is having pain during the day with worsening during the night. He has prescribed percocet 5/325mg  from his PCP, but reports this didn't help to reduce his pain. Over the weekend he fell into the sink with his surgical arm. He reports numbness to right chest with tightness and aching pain to traps and shoulder, he also has a sensation of numbness.  He is using ibuprofen for pain as well. He has not used ice or heat since discharge from the hospital.     Current pain medications:  He is on Percocet 5-325 mg BID PRN.     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 For 4 years, he has had limited shoulder mobility, right neck pain and stiffness, shoulder pain radiating down the arm, and tingling and cramping in the fingers in an ulnar distribution. (Patient initially complained of RUE sx but at follow up endorses a component of LUE sx as well). He was previously seen at [facility] and had glenohumeral injections and PT for possible frozen shoulder with some improvement. Patient saw Dr. Clydene Pugh in Ortho in 09/2019 who felt sx were possibly 2/2 TOS vs. cervical radiculopathy. EMG was ordered at that time but never obtained. Patient has also considered rotator cuff surgery with a provider in [place]. Previous shoulder MRIs have found mild tendinosis of supraspinatus tendon. Patient has tried PT and dry needling without relief.     Past Medical History:   Past Medical History:   Diagnosis Date    Anxiety     Asthma     Autoimmune autonomic neuropathy     Breast cyst     Financial difficulties     Migraines     Neuromuscular disorder (CMS-HCC)     Scleroderma (CMS-HCC)     Visual impairment        Past Surgical History:  Past Surgical History:   Procedure Laterality Date    CHOLECYSTECTOMY      HYSTERECTOMY Bilateral     08/2017    MASTECTOMY      04/2018    OOPHORECTOMY  2005    laparotomy    PR CYSTOURETHROSCOPY N/A 08/30/2017    Procedure: CYSTOURETHROSCOPY (SEPARATE PROCEDURE);  Surgeon: Forbes Cellar, MD;  [facility];  Service: Advanced Laparoscopy    PR EXCISION TURBINATE,SUBMUCOUS Bilateral 01/04/2021    Procedure: SUBMUCOUS RESECTION INFERIOR TURBINATE, PARTIAL OR COMPLETE, ANY METHOD;  Surgeon: Adam Swaziland Kimple, MD;  Location: [facility];  Service: ENT    PR INCISE TENDON/MUSCLE,SHLDR,SINGLE Right 11/16/2021    Procedure: RIGHT PEC MINOR RELEASE;  Surgeon: Thora Lance, MD;  Location: MAIN OR [place];  Service: Vascular    PR LAPAROSCOPY W TOT HYSTERECTUTERUS <=250 GRAM  W TUBE/OVARY N/A 08/30/2017    Procedure: LAPAROSCOPY, SURGICAL, WITH TOTAL HYSTERECTOMY, FOR UTERUS 250 G OR LESS; W/REMOVAL TUBE(S) AND/OR OVARY(S);  Surgeon: Forbes Cellar, MD;  [facility] OR [facility];  Service: Advanced Laparoscopy    PR MASTECTOMY, SIMPLE, COMPLETE Bilateral 01/09/2020    Procedure: MASTECTOMY, SIMPLE, COMPLETE;  Surgeon: Carola Rhine, MD;  Location: MAIN OR [place];  Service: Plastics    PR NEUROPLASTY BRACHIAL PLEXUS,OPEN Right 11/16/2021    Procedure: RIGHT TOS DECOMPRESSION;  Surgeon: Thora Lance, MD;  Location: MAIN OR [place];  Service: Vascular    PR REPAIR OF NASAL SEPTUM N/A 01/04/2021    Procedure: SEPTOPLASTY OR SUBMUCOUS RESECTION, WITH OR WITHOUT CARTILAGE SCORING, CONTOURING OR REPLACEMENT WITH GRAFT;  Surgeon: Adam Swaziland Kimple, MD;  Location: [facility] OR [facility];  Service: ENT    PR UPPER GI ENDOSCOPY,BIOPSY N/A 08/13/2019    Procedure: UGI ENDOSCOPY; WITH BIOPSY, SINGLE OR MULTIPLE;  Surgeon: Alfred Levins, MD;  [facility] PROCED[facility];  Service: Gastroenterology    SINUS SURGERY         Social History:  Patient  reports that he has never smoked. He has never used smokeless tobacco. He reports that he does not currently use alcohol. He reports current drug use. Drug: Marijuana.    ROS:  Balance of systems reviewed x 10 and negative except as noted in HPI.      DASH Score  Date    12/01/2021     Physical Examination:   There were no vitals filed for this visit.    General: Well appearing, no acute distress  Psych: Awake, Alert , Oriented by 3. Affect is appropriate.  Neck: Supple, no evidence of JVD, no evidence of thyromegaly, no cervical bruits  Cardiac: RRR  Lungs: Easy work of breathing, CTA Bilaterally  Upper extremities: Warm and well perfused. Radial artery pulses bilaterally 2+. Limited RUE ROM and shoulder mobility. Right supraclavicular and pec minor incision healing well, no drainage, no signs of infection.     Imaging (independently reviewed):    Chest x-ray    30 minutes spent face-to-face counseling and coordination of care and greater than 50% of the visit was spent face-to-face

## 2021-11-30 NOTE — Unmapped (Signed)
Patient seen for Xolair (Specialty) injection. Patient tolerated well. Patient left clinic without issue.

## 2021-12-01 ENCOUNTER — Ambulatory Visit
Admit: 2021-12-01 | Discharge: 2021-12-02 | Payer: PRIVATE HEALTH INSURANCE | Attending: Nurse Practitioner | Primary: Nurse Practitioner

## 2021-12-01 ENCOUNTER — Ambulatory Visit: Admit: 2021-12-01 | Discharge: 2021-12-02 | Payer: PRIVATE HEALTH INSURANCE

## 2021-12-01 MED ORDER — METHYLPREDNISOLONE 4 MG TABLETS IN A DOSE PACK
0 refills | 0 days | Status: CP
Start: 2021-12-01 — End: ?
  Filled 2021-12-01: qty 21, 6d supply, fill #0

## 2021-12-01 MED ORDER — OXYCODONE 5 MG TABLET
ORAL_TABLET | ORAL | 0 refills | 4 days | Status: CP | PRN
Start: 2021-12-01 — End: ?
  Filled 2021-12-01: qty 45, 4d supply, fill #0

## 2021-12-01 MED ORDER — DIAZEPAM 5 MG TABLET
ORAL_TABLET | Freq: Two times a day (BID) | ORAL | 0 refills | 23 days | Status: CP
Start: 2021-12-01 — End: ?
  Filled 2021-12-01: qty 45, 23d supply, fill #0

## 2021-12-01 MED ORDER — TIZANIDINE 4 MG TABLET
ORAL_TABLET | Freq: Four times a day (QID) | ORAL | 0 refills | 8 days | Status: CP | PRN
Start: 2021-12-01 — End: ?
  Filled 2021-12-01: qty 30, 8d supply, fill #0

## 2021-12-01 NOTE — Unmapped (Addendum)
I sent in a prescription for medrol dose pack - this is a steroid and this will help with the flare you are having now     Continue ibuprofen     I sent in oxycodone for you, I have changed the prescription for 5-10mg  (1-2 tabs), you may need to take the 10mg  when you are doing physical therapy.    I sent the refill in for diazepam (valium) this is a stronger muscle relaxer and may be good to use at night to help muscle tightness while you sleep    I also sent tizanidine (zanaflex) this is also a muscle relaxer, but not as strong as diazepam. I wrote for a 4mg  tablet, this medication can make you sleepy, so I suggest you break the tab in half (2mg ) and try during the day for muscle spasms and tightness.    Please continue with ice and heat. Ok to use any type of creams or gels such as biofreeze, icy hot patches, volaten gel (which is a topical ibuprofen)     If you need to talk to me and we need to change any mediations or have any concerns, call me at [phone] or send a mychart message

## 2021-12-15 ENCOUNTER — Ambulatory Visit: Admit: 2021-12-15 | Discharge: 2021-12-16 | Payer: PRIVATE HEALTH INSURANCE

## 2021-12-15 MED ORDER — OXYCODONE 5 MG TABLET
ORAL_TABLET | ORAL | 0 refills | 4 days | Status: CP | PRN
Start: 2021-12-15 — End: ?
  Filled 2021-12-16: qty 45, 4d supply, fill #0

## 2021-12-15 MED ORDER — DIAZEPAM 5 MG TABLET
ORAL_TABLET | Freq: Two times a day (BID) | ORAL | 0 refills | 23 days | Status: CP
Start: 2021-12-15 — End: ?

## 2021-12-15 MED ORDER — TIZANIDINE 4 MG TABLET
ORAL_TABLET | Freq: Four times a day (QID) | ORAL | 0 refills | 8 days | Status: CP | PRN
Start: 2021-12-15 — End: ?
  Filled 2021-12-16: qty 30, 8d supply, fill #0

## 2021-12-15 NOTE — Unmapped (Signed)
[facility]- [facility]  Established Patient Clinic Note    Assessment/Plan:   Mr.Bucher is a 41 y.o.adult who presents for follow-up:     Problem List Items Addressed This Visit          Other    Chronic pain syndrome    Relevant Medications    gabapentin (NEURONTIN) 250 mg/5 mL oral solution     Attending: Dr. Hurman Horn    # History of thoracic outlet syndrome - chronic pain syndrome  Recently underwent R TOS decompression and R pec minor release on 11/16/21 with SRV; procedure complicated by pleural violation for which R chest tube was placed and subsequently removed. Had an extensive pain regimen including PCA, gabapentin, and valium (for muscle spasm). Discharged with a new prescription for valium 5 mg BID (for muscle spasms), tizanidine 4 mg q6h PRN (also for muscle spasms), and oxycodone 10 mg q4h PRN (for pain), all written by Rayetta Pigg, [name]. Given that this was all in the context of pt's recent surgery, I do not consider this to be a violation of our controlled substance agreement, however I will message Ms. Clent Ridges to clarify pt's pain regimen moving forward. I still feel, and pt agrees, that he would be a great candidate for [facility].   - Continue post-op pain regimen per SRV  - Clarify plans for further pain management with SRV, including whether they or I will be managing pt's pain moving forward.   - No refills from me today  - Continue to pursue pain clinic (significant wait at this time, but hopefully can be expedited)     # Insomnia - GAD - Bipolar I - Benzodiazepine dependence  Currently taking gabapentin 20 mL at 7:00 PM, then another 10 mL at 4:00 AM due to difficulty sleeping. Initially planned for gradual taper (decrease to 10 mL at 7:00 PM and 10 mL at 4:00 AM x1 week, then discontinue the 4:00 AM dose, then after another week discontinue altogether). Surgery interfered with taper, now taking 16-17 mL nightly, for sleep. Still has 12 left of the klonopin from above); strongly recommended pt stop taking PRN klonopin while taking valium, pt verbalizes understanding of risks and will stop taking klonopin for now. Will discuss benzo taper with SRV per above. Will refill gabapentin today with taper plan per below, no refill on klonopin.   - Continue following with Dr. Matilde Haymaker  - Monthly follow-ups for now  - Coordinate regimen with SRV; pt to stop taking any klonopin while taking valium.   - Following gabapentin, will wean benzos next  - gabapentin (NEURONTIN) 250 mg/5 mL oral solution; Take 15 mL (750 mg total) by mouth nightly. After one week, reduce to 10 mL (500 mg) once nightly. After another week, reduce to 5 mL (250 mg) once nightly. After another week, discontinue altogether.  Dispense: 450 mL; Refill: 0    # OSA  Feels that CPAP pressure is too high once he starts to fall asleep. Would like to adjust settings. I will reach out to [facility] sleep medicine to see if this is possible, in the meantime will encourage continued CPAP use.   - Discuss settings with sleep med  - Continue nightly CPAP use    HEALTH MAINTENANCE ITEMS STILL DUE:  Health Maintenance Due   Topic Date Due    COVID-19 Vaccine (5 - Pfizer series) 05/18/2021    Pneumococcal Vaccine 0-64 (2 - PCV) 06/24/2021     Follow-up: No  follow-ups on file.    Future Appointments   Date Time Provider Department Center   12/27/2021  8:20 AM Kern Reap, MD [facility]   12/30/2021  9:45 AM Thora Lance, MD [facility]   01/11/2022  1:40 PM Kern Reap, MD [facility]   01/18/2022  9:00 AM Mena Goes, PsyD [facility]   02/25/2022  8:00 AM Scherrie Gerlach, MD [facility]       Subjective   Mr. Murray is a 41 y.o. adult  coming to clinic today for the following issues:    Chief Complaint   Patient presents with    Follow-up     Follow up on surgery and medications      HPI: see above for more information.     I have reviewed the problem list, medications, and non-icteric, no discharge.  Neck: no thyroid enlargement or masses  Lungs: No increased work of breathing or audible wheezing  Skin: Warm, dry, no erythema or rash on exposed skin  Musculoskeletal: No visible gait abnormalities  Neurologic: Alert & oriented x 3, no gross sensorimotor abnormalities  Psychiatric: Pleasant, cooperative, good eye contact, appropriate thought processes    Wt Readings from Last 3 Encounters:   12/15/21 73.5 kg (162 lb)   12/01/21 75.8 kg (167 lb)   11/18/21 86.2 kg (190 lb 0.6 oz)      PHQ-9 PHQ-9 TOTAL SCORE   10/22/2020   9:00 AM 6   09/04/2020   9:00 AM 3   08/21/2020   9:00 AM 5   05/22/2020   3:00 PM 9     PHQ-2 Score: 0    PHQ-9 Score:      Inocente Salles Score:      {select_status_or_delete_smartlist:64641}    GAD-7 score:      LABS/IMAGING  {Labs/Imaging options:52765}    Alver Fisher, MD, MPH (he/him)  Resident Physician, PGY-2  Department of Family Medicine      [facility]  [address] [place], [place] [address] ??? Telephone [phone] ??? Fax [phone]  [email or web address] [phone]  [email or web address]

## 2021-12-16 MED ORDER — GABAPENTIN 250 MG/5 ML ORAL SOLUTION
Freq: Every evening | ORAL | 0 refills | 30 days | Status: CP
Start: 2021-12-16 — End: 2022-01-15
  Filled 2021-12-28: qty 450, 30d supply, fill #0

## 2021-12-18 NOTE — Unmapped (Signed)
Hi Meet,    It was great to see you today. I look forward to seeing you at your next visit. In the meantime, for non-urgent questions, please message our clinic on MyChart. I am typically able to respond to these messages within 3-5 business days. For more urgent matters, please call our clinic at [phone]. In the event of a medical emergency, call 911 and/or visit our urgent care or an emergency room.     Take care,    Claudie Revering, MD, MPH   He, him, his  Resident Physician, PGY-3    [facility]   [address]   [place], [place] [address]   [phone] (Phone); [phone] (Fax)     [facility] MyChart is for non-urgent messages. I do my best to respond within 3 business days, however occasionally it may take longer. If you have immediate concerns, contact our clinic by phone [phone].      [facility] has an Urgent Care!  Family Medicine Urgent Care Hours: Monday-Friday 7am-8pm; Saturday and Sunday 12pm-5pm   If you think you are having an emergency, please call 911 or go to your nearest emergency department.

## 2021-12-27 MED ORDER — TIZANIDINE 4 MG TABLET
ORAL_TABLET | Freq: Four times a day (QID) | ORAL | 0 refills | 8 days | PRN
Start: 2021-12-27 — End: ?

## 2021-12-27 MED ORDER — OXYCODONE 5 MG TABLET
ORAL_TABLET | ORAL | 0 refills | 4.00000 days | Status: CN | PRN
Start: 2021-12-27 — End: ?
  Filled 2021-12-28: qty 45, 4d supply, fill #0

## 2021-12-28 DIAGNOSIS — F902 Attention-deficit hyperactivity disorder, combined type: Principal | ICD-10-CM

## 2021-12-28 DIAGNOSIS — K219 Gastro-esophageal reflux disease without esophagitis: Principal | ICD-10-CM

## 2021-12-28 MED ORDER — DEXTROAMPHETAMINE-AMPHETAMINE 20 MG TABLET
ORAL_TABLET | Freq: Every day | ORAL | 0 refills | 10 days | PRN
Start: 2021-12-28 — End: 2022-01-27

## 2021-12-28 MED ORDER — TIZANIDINE 4 MG TABLET
ORAL_TABLET | Freq: Four times a day (QID) | ORAL | 0 refills | 8 days | Status: CP | PRN
Start: 2021-12-28 — End: ?
  Filled 2021-12-30: qty 30, 7d supply, fill #0

## 2021-12-28 MED ORDER — PANTOPRAZOLE 20 MG TABLET,DELAYED RELEASE
ORAL_TABLET | Freq: Every day | ORAL | 1 refills | 30 days
Start: 2021-12-28 — End: 2022-12-28

## 2021-12-28 NOTE — Unmapped (Signed)
[facility] Administered Medication Refill Coordination Note      NAME:Vincent Waters DOB: January 17, 1981      Medication: Xolair    Day Supply: 28 days      SHIPPING      Next delivery from [facility] [phone]) to  [facility] Starr  for Vincent Waters is scheduled for n/a.    Clinic contact: [name]    Patient's next nurse visit for administration: n/a-clinic denied refill @ this time will f/u in 3 weeks    We will follow up with clinic monthly for standard refill processing and delivery.      Vincent Waters  Specialty Pharmacy Technician

## 2021-12-29 MED ORDER — PANTOPRAZOLE 20 MG TABLET,DELAYED RELEASE
ORAL_TABLET | Freq: Every day | ORAL | 1 refills | 30 days | Status: CP
Start: 2021-12-29 — End: 2022-12-29

## 2021-12-29 MED ORDER — DEXTROAMPHETAMINE-AMPHETAMINE 20 MG TABLET
ORAL_TABLET | Freq: Every day | ORAL | 0 refills | 10 days | Status: CP | PRN
Start: 2021-12-29 — End: 2022-01-28
  Filled 2021-12-30: qty 10, 10d supply, fill #0

## 2021-12-30 ENCOUNTER — Ambulatory Visit
Admit: 2021-12-30 | Discharge: 2021-12-31 | Payer: PRIVATE HEALTH INSURANCE | Attending: Specialist | Primary: Specialist

## 2021-12-30 MED ORDER — IBUPROFEN 800 MG TABLET
ORAL_TABLET | Freq: Three times a day (TID) | ORAL | 2 refills | 20 days | Status: CP | PRN
Start: 2021-12-30 — End: ?
  Filled 2021-12-30: qty 60, 20d supply, fill #0

## 2021-12-30 MED ORDER — OXYCODONE 10 MG TABLET
ORAL_TABLET | Freq: Four times a day (QID) | ORAL | 0 refills | 13 days | Status: CP | PRN
Start: 2021-12-30 — End: ?
  Filled 2021-12-30: qty 50, 13d supply, fill #0

## 2021-12-30 MED ORDER — GABAPENTIN 300 MG CAPSULE
ORAL_CAPSULE | Freq: Three times a day (TID) | ORAL | 3 refills | 90 days | Status: CP
Start: 2021-12-30 — End: 2022-12-30
  Filled 2021-12-30: qty 270, 90d supply, fill #0

## 2021-12-30 MED ORDER — CLONAZEPAM 0.5 MG TABLET
ORAL_TABLET | Freq: Two times a day (BID) | ORAL | 0 refills | 30 days | Status: CP | PRN
Start: 2021-12-30 — End: ?
  Filled 2021-12-30: qty 60, 30d supply, fill #0

## 2021-12-30 NOTE — Unmapped (Signed)
Start gabapentin pills 300mg  three times per day    Tizanidine 2mg  twice per day and 4mg  at night    discontinue valium. Ok to resume Klonopin twice per day    Oxycodone changed to 10mg  every 6 hours as needed    Ibuprofen 800mg  three times per day    Stop physical therapy for the next 4 weeks, will return to clinic to see Belenda Cruise in 4 weeks and we will re-evaluate for PT.

## 2021-12-30 NOTE — Unmapped (Addendum)
Return TOS    Patient Name: Vincent Waters DOB:01/22/1981  Date of Encounter: 12/30/2021    Assessment:  Patient is a 41 y.o. male s/p right TOS decompression with pec minor release 11/16/21. Patient continues to have preoperative pain. Dash score remains stable from preoperative score. The patient is currently taking gabapentin and it would be opportune to continue this medication in order to wean off the narcotics, namely oxycodone. He stated, that he no longer has aversion to pills and is open to try gabapentin by mouth. I would recommend at a dose of 300 mg Q8H. Patient should pause PT in order to allow the arm time to heal. Ok to discontinue valium.     Plan:  - Patient to discontinue valium   - Patient to discontinue PT for 4 weeks and to reevaluate at that time  - Take the tizanidine 2 mg Q6H and 4 mg before bed. Do not take tizanidine and gabapentin at the same time.  - Prescriptions provided for:   - gabapentin 300 mg TID   - oxycodone 10 mg Q6H   - ibuprofen 800 mg Q8H   - clonazepam 0.5 mg BID  - RTC 4 weeks. Return precautions reviewed.     HPI: Vincent Waters is a 41 y.o. 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 Patient reports stable sx, however recently notes that his arm was crushed in a car door as well as catching himself from falling, injuring his arm. He has started PT, yet plans to pause for the time being.     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 For 4 years, he has had limited shoulder mobility, right neck pain and stiffness, shoulder pain radiating down the arm, and tingling and cramping in the fingers in an ulnar distribution. (Patient initially complained of RUE sx but at follow up endorses a component of LUE sx as well). He was previously seen at [facility] and had glenohumeral injections and PT for possible frozen shoulder with some improvement. Patient saw Dr. Clydene Pugh in Ortho in 09/2019 who felt sx were possibly 2/2 TOS vs. cervical radiculopathy. EMG was ordered at that time but never obtained. Patient has also considered rotator cuff surgery with a provider in [place]. Previous shoulder MRIs have found mild tendinosis of supraspinatus tendon. Patient has tried PT and dry needling without relief.     Past Medical History:   Past Medical History:   Diagnosis Date    Anxiety     Asthma     Autoimmune autonomic neuropathy     Breast cyst     Financial difficulties     Lack of access to transportation     Migraines     Neuromuscular disorder (CMS-HCC)     Scleroderma (CMS-HCC)     Visual impairment        Past Surgical History:  Past Surgical History:   Procedure Laterality Date    CHOLECYSTECTOMY      HYSTERECTOMY Bilateral     08/2017    MASTECTOMY      04/2018    OOPHORECTOMY  2005    laparotomy  PR CYSTOURETHROSCOPY N/A 08/30/2017    Procedure: CYSTOURETHROSCOPY (SEPARATE PROCEDURE);  Surgeon: Forbes Cellar, MD;  [facility];  Service: Advanced Laparoscopy    PR EXCISION TURBINATE,SUBMUCOUS Bilateral 01/04/2021    Procedure: SUBMUCOUS RESECTION INFERIOR TURBINATE, PARTIAL OR COMPLETE, ANY METHOD;  Surgeon: Adam Swaziland Kimple, MD;  [facility];  Service: ENT    PR INCISE TENDON/MUSCLE,SHLDR,SINGLE Right 11/16/2021    Procedure: RIGHT PEC MINOR RELEASE;  Surgeon: Thora Lance, MD;  Location: MAIN OR [place];  Service: Vascular    PR LAPAROSCOPY W TOT HYSTERECTUTERUS <=250 GRAM  W TUBE/OVARY N/A 08/30/2017    Procedure: LAPAROSCOPY, SURGICAL, WITH TOTAL HYSTERECTOMY, FOR UTERUS 250 G OR LESS; W/REMOVAL TUBE(S) AND/OR OVARY(S);  Surgeon: Forbes Cellar, MD;  Location: [facility] OR [facility];  Service: Advanced Laparoscopy    PR MASTECTOMY, SIMPLE, COMPLETE Bilateral 01/09/2020    Procedure: MASTECTOMY, SIMPLE, COMPLETE;  Surgeon: Carola Rhine, MD;  Location: MAIN OR [place];  Service: Plastics    PR NEUROPLASTY BRACHIAL PLEXUS,OPEN Right 11/16/2021    Procedure: RIGHT TOS DECOMPRESSION;  Surgeon: Thora Lance, MD;  Location: MAIN OR [place];  Service: Vascular    PR REPAIR OF NASAL SEPTUM N/A 01/04/2021    Procedure: SEPTOPLASTY OR SUBMUCOUS RESECTION, WITH OR WITHOUT CARTILAGE SCORING, CONTOURING OR REPLACEMENT WITH GRAFT;  Surgeon: Adam Swaziland Kimple, MD;  Location: [facility];  Service: ENT    PR UPPER GI ENDOSCOPY,BIOPSY N/A 08/13/2019    Procedure: UGI ENDOSCOPY; WITH BIOPSY, SINGLE OR MULTIPLE;  Surgeon: Alfred Levins, MD;  Location: [facility] PROCEDURES [facility];  Service: Gastroenterology    SINUS SURGERY         Social History:  Patient  reports that he has been smoking. He has never used smokeless tobacco. He reports that he does not currently use alcohol. He reports current drug use. Drug: Marijuana.    ROS:  Balance of systems reviewed x 10 and negative except as noted in HPI.      DASH Score  Date   65.90 12/30/2021     Physical Examination:   Vitals:    12/30/21 0947   BP: 112/76   Pulse: 94   Temp: 36.3 ??C (97.3 ??F)     General: Well appearing, no acute distress  Psych: Awake, Alert , Oriented by 3. Affect is appropriate.  Neck: Supple, no evidence of JVD, no evidence of thyromegaly, no cervical bruits  Cardiac: RRR  Lungs: Easy work of breathing, CTA Bilaterally  Upper extremities: Warm and well perfused. Radial artery pulses bilaterally 2+. Limited RUE ROM and shoulder mobility.   Nydia Bouton + R - L    Imaging (independently reviewed):    None      45 minutes spent face-to-face counseling and coordination of care and greater than 50% of the visit was spent face-to-face    ______________________________________________________________________    Documentation assistance was provided by Eugenia Pancoast, Scribe, on December 30, 2021 at 9:37 AM for Doroteo Glassman, [name], and Thora Lance, MD    I attest that I have reviewed the scribe note and that the components of the history of the present illness, the physical exam, and the assessment and plan documented were performed by me.    Cathie Beams, MD  Vascular Surgery PGY 2    ----------------------------------------------------------------------------------------------------------------------  December 31, 2021 4:31 PM. Documentation assistance provided by the Scribe. I was present during the time the encounter was recorded. The information recorded by the Scribe was done  at my direction and has been reviewed and validated by me.  ----------------------------------------------------------------------------------------------------------------------    I have met and examined the patient. I discussed the patient's case with the resident.  I am in agreement with the resident's assessment and plan, as outlined in the note.    Thora Lance, MD  Professor of Surgery  Division of Vascular Surgery  Pager : [phone]

## 2022-01-07 DIAGNOSIS — Z789 Other specified health status: Principal | ICD-10-CM

## 2022-01-07 MED ORDER — TESTOSTERONE CYPIONATE 200 MG/ML INTRAMUSCULAR OIL
SUBCUTANEOUS | 0 refills | 70 days
Start: 2022-01-07 — End: 2022-03-18

## 2022-01-09 MED ORDER — TESTOSTERONE CYPIONATE 200 MG/ML INTRAMUSCULAR OIL
SUBCUTANEOUS | 0 refills | 70 days
Start: 2022-01-09 — End: 2022-03-20

## 2022-01-11 ENCOUNTER — Telehealth: Admit: 2022-01-11 | Discharge: 2022-01-12 | Payer: PRIVATE HEALTH INSURANCE

## 2022-01-11 DIAGNOSIS — R109 Unspecified abdominal pain: Principal | ICD-10-CM

## 2022-01-11 DIAGNOSIS — G540 Brachial plexus disorders: Principal | ICD-10-CM

## 2022-01-11 DIAGNOSIS — F411 Generalized anxiety disorder: Principal | ICD-10-CM

## 2022-01-11 DIAGNOSIS — G43009 Migraine without aura, not intractable, without status migrainosus: Principal | ICD-10-CM

## 2022-01-11 DIAGNOSIS — Z79899 Other long term (current) drug therapy: Principal | ICD-10-CM

## 2022-01-11 DIAGNOSIS — G894 Chronic pain syndrome: Principal | ICD-10-CM

## 2022-01-11 MED ORDER — EMTRICITABINE 200 MG-TENOFOVIR ALAFENAMIDE FUMARATE 25 MG TABLET
ORAL_TABLET | Freq: Every day | ORAL | 0 refills | 90 days | Status: CP
Start: 2022-01-11 — End: ?

## 2022-01-11 NOTE — Unmapped (Signed)
Video Visit Note    This medical encounter was conducted virtually using [email or web address]  TeleHealth protocols.    I have identified myself to the patient and conveyed my credentials to Mr. Archambault  Patient/proxy has provided verbal consent to proceed with this video visit.  In case we get disconnected, [phone] (home) [phone] (work)  In case of Emergency, patient's current location: Home: [address] [name]  [place] [address]  Is there someone else in the room? No.     Nehemias [facility] is a 41 y.o. adult that presents for a video visit today.     Assessment/Plan:      Problem List Items Addressed This Visit          Cardiovascular and Mediastinum    Migraine without aura and without status migrainosus, not intractable       Other    Generalized anxiety disorder    Chronic pain syndrome - Primary    Thoracic outlet syndrome    On pre-exposure prophylaxis for HIV    Relevant Medications    emtricitabine-tenofovir alafen (DESCOVY) 200-25 mg tablet     Other Visit Diagnoses       Abdominal pain, unspecified abdominal location              Followup:  No follow-ups on file.    # Thoracic outlet syndrome - Chronic pain syndrome  Working with vascular surgery team, PT, and pain specialist (via vascular surgery, will continue following moving forward) to wean off of opioids long-term. Previously took liquid gabapentin nightly for sleep, now taking pill form TID for neuropathic pain, prescribed by SRV; also spacing out oxy from q4h to q6h. I am not currently prescribing any pain meds for him, SRV has taken over for the time being (with likely transition over to pain clinic after immediate post-op period). Unfortunately has had to stop PT for the month due to some post-op complications, but will be going back this coming month.   - Continue following with SRV for pain management  - Long-term goal of transitioning to pain management clinic    # Dizziness - migraines  Has had more dizziness recently, as well as return of migraines. No red flag symptoms. Likely due in part to multiple med adjustments per above. Has not seen neurology in over a year, is overdue for follow-up. Pt will call to schedule follow-up appt.   - Neurology follow-up.    # Anxiety   Still on clonazepam BID, prescribed by SRV for anxiety and muscle spasms. Working with SRV on scaling back as tolerated. Agitation and aggression and paranoia happening more often. Has visit with Dr. Matilde Haymaker scheduled for 8/1, is looking forward to this and hopefully establishing longer-term therapy with a community therapist.  - Dr. Matilde Haymaker visit on 8/1  - Continue working with SRV to taper off of clonazepam  - List of community therapists provided    # PrEP against HIV  Has stopped taking truvada due to GI upset, open to trying descovy. Patient is sexually active transgender male with transgender and cisgender men and women. They have not had condom-less sexual encounters since last visit. No known exposure to HIV. They do not use IV drugs. Condom use: inconsistent. Denies fever, fatigue, skin rash, pharyngitis, cervical lymphadenopathy, or any other symptoms concerning for acute HIV. Based on this information, they are at high risk for contracting HIV and are a good candidate for pre-exposure prophylaxis against HIV. This is a preventive medicine service  and E/M modifier 33 should apply to all testing and prescriptions. The lab tests listed below are required to ensure the safety of continuing pre-exposure prophylaxis (PrEP) against HIV.  - Labs at next follow-up visit: HIV, Cr, RPR, GC/CT (urine, throat, anal)  - Provided adherence and risk reduction / safer sex counseling  - PrEP refill provided today, switching from truvada to descovy due to GI upset    # Right-sided abdominal pain - history of hepatic cysts - BRCA history  Has GI appt for Sept. Would like to discuss possible imaging given history of hepatic cysts and BRCA positive, for which he previously received several early cancer screens. Will RTC for in-person visit in the next few weeks to discuss further.   - GI in Sept  - RTC for in-person exam    The patient reports they are currently: at home. I spent 30 minutes on the real-time audio and video with the patient on the date of service. I spent an additional 10 minutes on pre- and post-visit activities on the date of service.     The patient was physically located in [place] or a state in which I am permitted to provide care. The patient and/or parent/guardian understood that s/he may incur co-pays and cost sharing, and agreed to the telemedicine visit. The visit was reasonable and appropriate under the circumstances given the patient's presentation at the time.    The patient and/or parent/guardian has been advised of the potential risks and limitations of this mode of treatment (including, but not limited to, the absence of in-person examination) and has agreed to be treated using telemedicine. The patient's/patient's family's questions regarding telemedicine have been answered.     If the visit was completed in an ambulatory setting, the patient and/or parent/guardian has also been advised to contact their provider???s office for worsening conditions, and seek emergency medical treatment and/or call 911 if the patient deems either necessary.    Subjective:     HPI  Mr. Eastman is a 41 y.o. adult requesting a video visit. Please see above for more information.     ROS  As per HPI.    HISTORY  I have reviewed the patient's problem list, current medications, and allergies and have updated/reconciled them as needed.    Objective:   Patient Reported Blood Pressure Readings    General: Well appearing , in no acute distress  Skin: Color is normal. No rashes.  Psych: Appropriate affect, normal mood  Resp: Normal work of breathing, no retractions    Alver Fisher, MD, MPH (he/him)  Resident Physician, PGY-3  Department of Family Medicine

## 2022-01-12 NOTE — Unmapped (Signed)
This was a telehealth service performed by a resident and I was immediately available to the resident in-person or via real-time audio/video. I reviewed with the resident the medical history and assessment/plan and concur with the treatment plan as documented in the resident note.   Patriciaann Clan, DO

## 2022-01-12 NOTE — Unmapped (Signed)
Hi Vincent Waters,    It was great to see you today. I look forward to seeing you at your next visit. In the meantime, for non-urgent questions, please message our clinic on MyChart. I am typically able to respond to these messages within 3-5 business days. For more urgent matters, please call our clinic at [phone]. In the event of a medical emergency, call 911 and/or visit our urgent care or an emergency room.     Take care,    Claudie Revering, MD, MPH   He, him, his  Resident Physician, PGY-3    [facility] Medicine   [address]   [place], [place] [address]   [phone] (Phone); [phone] (Fax)     [facility] MyChart is for non-urgent messages. I do my best to respond within 3 business days, however occasionally it may take longer. If you have immediate concerns, contact our clinic by phone [phone].      [facility] Family Medicine has an Urgent Care!  Family Medicine Urgent Care Hours: Monday-Friday 7am-8pm; Saturday and Sunday 12pm-5pm   If you think you are having an emergency, please call 911 or go to your nearest emergency department.

## 2022-01-15 MED ORDER — TESTOSTERONE CYPIONATE 200 MG/ML INTRAMUSCULAR OIL
SUBCUTANEOUS | 0 refills | 70 days | Status: CP
Start: 2022-01-15 — End: 2022-03-26
  Filled 2022-01-24: qty 2, 56d supply, fill #0

## 2022-01-17 ENCOUNTER — Institutional Professional Consult (permissible substitution): Admit: 2022-01-17 | Discharge: 2022-01-18 | Payer: PRIVATE HEALTH INSURANCE

## 2022-01-17 DIAGNOSIS — L501 Idiopathic urticaria: Principal | ICD-10-CM

## 2022-01-17 MED ADMIN — omalizumab (XOLAIR) injection 300 mg: 300 mg | SUBCUTANEOUS | @ 16:00:00 | Stop: 2022-04-05

## 2022-01-17 NOTE — Unmapped (Signed)
Patient in for Xolair injections today. Received Xolair 300 mg subcutaneously, tolerated injections well. No other complaints at this time.     specialty

## 2022-01-19 NOTE — Unmapped (Signed)
[facility] Clinic Administered Medication Refill Coordination Note      NAME:Vincent Waters DOB: 1980/09/19      Medication: Xolair    Day Supply: 28 days      SHIPPING      Next delivery from [facility] [phone]) to  [facility]  for [facility] is scheduled for 08/09.    Clinic contact: Teodoro Kil    Patient's next nurse visit for administration: 08/28.    We will follow up with clinic monthly for standard refill processing and delivery.      Vincent Waters  Specialty Pharmacy Technician

## 2022-01-25 MED FILL — XOLAIR 150 MG/ML SUBCUTANEOUS SYRINGE: SUBCUTANEOUS | 28 days supply | Qty: 2 | Fill #1

## 2022-01-26 ENCOUNTER — Ambulatory Visit
Admit: 2022-01-26 | Discharge: 2022-01-27 | Payer: PRIVATE HEALTH INSURANCE | Attending: Nurse Practitioner | Primary: Nurse Practitioner

## 2022-01-26 DIAGNOSIS — G540 Brachial plexus disorders: Principal | ICD-10-CM

## 2022-01-26 MED ORDER — OXYCODONE 10 MG TABLET
ORAL_TABLET | Freq: Four times a day (QID) | ORAL | 0 refills | 13.00000 days | Status: CP | PRN
Start: 2022-01-26 — End: ?
  Filled 2022-01-26: qty 50, 13d supply, fill #0

## 2022-01-26 MED ORDER — TIZANIDINE 4 MG TABLET
ORAL_TABLET | Freq: Four times a day (QID) | ORAL | 3 refills | 8 days | Status: CP | PRN
Start: 2022-01-26 — End: ?
  Filled 2022-01-26: qty 30, 8d supply, fill #0

## 2022-01-26 MED FILL — IBUPROFEN 800 MG TABLET: ORAL | 20 days supply | Qty: 60 | Fill #1

## 2022-01-26 MED FILL — TRULANCE 3 MG TABLET: ORAL | 30 days supply | Qty: 30 | Fill #0

## 2022-01-26 MED FILL — DESCOVY 200 MG-25 MG TABLET: ORAL | 90 days supply | Qty: 90 | Fill #0
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
## 2022-01-26 NOTE — Unmapped (Signed)
Per AL:  follow up with me or jamie within the next month

## 2022-02-04 DIAGNOSIS — Z789 Other specified health status: Principal | ICD-10-CM

## 2022-02-04 DIAGNOSIS — F902 Attention-deficit hyperactivity disorder, combined type: Principal | ICD-10-CM

## 2022-02-04 DIAGNOSIS — IMO0001 Endocrine disorder in female-to-male transgender person: Principal | ICD-10-CM

## 2022-02-04 MED ORDER — DEXTROAMPHETAMINE-AMPHETAMINE 20 MG TABLET
ORAL_TABLET | Freq: Every day | ORAL | 0 refills | 10 days | PRN
Start: 2022-02-04 — End: 2022-03-06

## 2022-02-04 MED ORDER — EASY TOUCH LUER LOCK SYRINGE 1 ML
0 refills | 0 days
Start: 2022-02-04 — End: ?

## 2022-02-06 MED ORDER — DEXTROAMPHETAMINE-AMPHETAMINE 20 MG TABLET
ORAL_TABLET | Freq: Every day | ORAL | 0 refills | 10 days | Status: CP | PRN
Start: 2022-02-06 — End: 2022-03-08
  Filled 2022-02-08: qty 10, 10d supply, fill #0

## 2022-02-07 MED ORDER — OXYCODONE 10 MG TABLET
ORAL_TABLET | Freq: Four times a day (QID) | ORAL | 0 refills | 13 days | PRN
Start: 2022-02-07 — End: ?

## 2022-02-08 MED ORDER — OXYCODONE 10 MG TABLET
ORAL_TABLET | ORAL | 0 refills | 12 days | Status: CP | PRN
Start: 2022-02-08 — End: ?
  Filled 2022-02-08: qty 70, 12d supply, fill #0

## 2022-02-08 MED ORDER — CLONAZEPAM 0.5 MG TABLET
ORAL_TABLET | Freq: Two times a day (BID) | ORAL | 0 refills | 30 days | PRN
Start: 2022-02-08 — End: ?

## 2022-02-08 MED ORDER — EASY TOUCH LUER LOCK SYRINGE 1 ML
0 refills | 0 days
Start: 2022-02-08 — End: ?

## 2022-02-08 MED FILL — EASY TOUCH LUER LOCK SYRINGE 1 ML: 28 days supply | Qty: 4 | Fill #0

## 2022-02-08 MED FILL — BD SAFETYGLIDE NEEDLE 18 GAUGE X 1 1/2": 84 days supply | Qty: 12 | Fill #1

## 2022-02-08 MED FILL — BD REGULAR BEVEL NEEDLES 25 GAUGE X 5/8": 84 days supply | Qty: 12 | Fill #1

## 2022-02-17 NOTE — Unmapped (Signed)
[facility] Administered Medication Refill Coordination Note      NAME:Vincent Waters DOB: Jan 12, 1981      Medication: Xolair    Day Supply: 28 days      SHIPPING      Next delivery from [facility] [phone]) to  [facility] Vincent Waters  for Vincent Waters is scheduled for n/a.    Clinic contact: Vincent Waters    Patient's next nurse visit for administration: n/a clinic denied refill @ this time will follow up in 3 weeks.    We will follow up with clinic monthly for standard refill processing and delivery.      Vincent Waters Vincent Waters  Specialty Pharmacy Technician

## 2022-02-20 MED ORDER — OXYCODONE 10 MG TABLET
ORAL_TABLET | Freq: Four times a day (QID) | ORAL | 0 refills | 13 days | PRN
Start: 2022-02-20 — End: ?

## 2022-02-22 NOTE — Unmapped (Unsigned)
[facility] at [facility]  Initial Consultation Visit       Reason for Visit:  steatorrhea  Referring Provider: Herminio Waters*   Primary Care Provider: Kern Reap, MD    Vincent Waters is a 41 y.o. adult seen in consultation at the request of Vincent Waters* for evaluation of No primary diagnosis found..    Assessment and Plan   [name] is a 41 y.o. 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    Steatorrhea    Most common causes of chronic diarrhea (>4 weeks) in resource-abundant settings include IBS, IBD, microscopic colitis, malabsorption syndromes (lactose intolerance, celiac disease, chronic pancreatitis, SIBO), post-cholecystectomy/bile acid (ileal disease) diarrhea, and chronic infections (particularly in patients who are immunocompromised; C. Diff, Campylobacter, Giardia, Cryptosporidium, Cyclospora, Whipples).    Fatty/malabsorptive diarrhea (presents with stools that are oily, greasy, difficult to flush) includes celiac, SIBO, pancreatic exocrine insufficiency.    Plan:  ***    There are no diagnoses linked to this encounter.     No follow-ups on file.    Seen and discussed with Dr. Marland Kitchen    Subjective       History of Present Illness:   Vincent Waters is a 40 y.o. 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    Previously seen by Dr. Luan Pulling [phone] for GI dysmotility. Had constipation with hemorrhoids and anal fissure. Was trialed on Trulance.     EGD 08/13/2019: 3 cm hiatal hernia, very little gastric motility noted.     Denies {CSGGIROS (Optional):98766}.   Denies the use of {CSGMEDS (Optional):98768::ASA,NSAIDs,antiplatelets,anticoagulants}.   Denies a family history of {CSGFAMHX (Optional):98769}.  {CSGSURGHX (Optional):99162::Denies a history of any abdominal surgeries.}    Current Outpatient Medications   Medication Instructions    azelastine (ASTELIN) 137 mcg (0.1 %) nasal spray Instill 2 sprays into each nostril two (2) times a day as needed for rhinitis.    budesonide-formoteroL (SYMBICORT) 160-4.5 mcg/actuation inhaler Inhale 1 puff by mouth Two (2) times a day.    cetirizine (ZYRTEC) 10 mg, Oral, Daily (standard)    clindamycin (CLEOCIN T) 1 % lotion 1 application., As needed (once a day)    clonazePAM (KLONOPIN) 0.5 mg, Oral, 2 times a day PRN    dextroamphetamine-amphetamine (ADDERALL) 20 mg tablet 20 mg, Oral, Daily PRN    emtricitabine-tenofovir alafen (DESCOVY) 200-25 mg tablet 1 tablet, Oral, Daily (standard)    EPINEPHrine (EPIPEN) 0.3 mg, Subcutaneous, Once    famotidine (PEPCID) 40 mg, Oral, Every evening    gabapentin (NEURONTIN) 300 mg, Oral, 3 times a day (standard)    ibuprofen (MOTRIN) 800 mg, Oral, Every 8 hours PRN    inhalational spacing device Spcr Use as directed with albuterol and symbicort    methylPREDNISolone (MEDROL DOSEPACK) 4 mg tablet follow package directions    needle, disp, 25 Jarrah (BD REGULAR BEVEL NEEDLES) 25 Martavion x 5/8 Ndle For subcutaneous hormone injection.    nifedipine 0.3% lidocaine 1.5% in petrolatum ointment Apply a pea-size amount to anal area 3 times a day    oxyCODONE (ROXICODONE) 10 mg, Oral, Every 6 hours PRN    oxyCODONE (ROXICODONE) 10 mg, Oral, Every 4 hours PRN    pantoprazole (PROTONIX) 20 mg, Oral, Daily (standard)  plecanatide (TRULANCE) 3 mg Tab Take one tablet by mouth every day    safety needles (BD SAFETYGLIDE NEEDLE) 18 Keldon x 1 1/2 Ndle For drawing hormone injection    syringe, disposable, (EASY TOUCH LUER LOCK SYRINGE) 1 mL Syrg Use for weekly hormone injection. syringe, disposable, 2.5 mL Syrg Use weekly    testosterone cypionate (DEPOTESTOTERONE CYPIONATE) 40 mg, Subcutaneous, Every 7 days    tiZANidine (ZANAFLEX) 4 mg, Oral, Every 6 hours PRN    triamcinolone (KENALOG) 0.1 % cream Topical, 2 times a day (standard)    XOLAIR 300 mg, Subcutaneous, Every 28 days       Past Medical History:  has a past medical history of Anxiety, Asthma, Autoimmune autonomic neuropathy, Breast cyst, Financial difficulties, Lack of access to transportation, Migraines, Neuromuscular disorder (CMS-HCC), Scleroderma (CMS-HCC), and Visual impairment.  Surgical History:  has a past surgical history that includes Cholecystectomy; pr laparoscopy w tot hysterectuterus <=250 gram  w tube/ovary (N/A, 08/30/2017); pr cystourethroscopy (N/A, 08/30/2017); pr upper gi endoscopy,biopsy (N/A, 08/13/2019); Hysterectomy (Bilateral); Mastectomy; Oophorectomy (2005); pr mastectomy, simple, complete (Bilateral, 01/09/2020); pr repair of nasal septum (N/A, 01/04/2021); pr excision turbinate,submucous (Bilateral, 01/04/2021); Sinus surgery; pr neuroplasty brachial plexus,open (Right, 11/16/2021); and pr incise tendon/muscle,shldr,single (Right, 11/16/2021).  Family History: family history includes Breast cancer in Vincent Waters maternal grandmother and sister; Cancer in Vincent Waters mother and sister; Cataracts in Vincent Waters maternal grandmother; No Known Problems in Vincent Waters father; Ovarian cancer in Vincent Waters maternal grandmother.  Social History:  reports that Vincent Waters has been smoking. Vincent Waters has never used smokeless tobacco. Vincent Waters reports that Vincent Waters does not currently use alcohol. Vincent Waters reports current drug use. Drug: Marijuana.     Objective      Physical Exam   Vital Signs: LMP 07/20/2017 (Exact Date)    Constitutional: Vincent Waters is in no apparent distress.   Eyes: No scleral icterus   Cardiovascular: No peripheral edema.   Gastrointestinal: Soft, nontender abdomen without hepatosplenomegaly, hernias or masses.***   Skin: No rashes noted   Neurologic: Nonfocal.     Wt Readings from Last 6 Encounters:   01/26/22 73.9 kg (163 lb)   12/30/21 73.9 kg (163 lb)   12/15/21 73.5 kg (162 lb)   12/01/21 75.8 kg (167 lb)   11/18/21 86.2 kg (190 lb 0.6 oz)   11/08/21 74.6 kg (164 lb 6.4 oz)     Weight Change Past 6 Months in Pounds (rounded): 8 at 02/22/2022 10:20 AM    Laboratory results:  Lab Results   Component Value Date    WBC 5.2 11/19/2021    HGB 12.9 11/19/2021    MCV 80.7 11/19/2021    PLT 272 11/19/2021      Lab Results   Component Value Date    NA 140 11/19/2021    K 3.8 11/19/2021    CL 103 11/19/2021    BUN <5 (L) 11/19/2021    CREATININE 0.80 11/19/2021    GLU 105 11/19/2021    CALCIUM 8.9 11/19/2021      Lab Results   Component Value Date    ALBUMIN 4.1 02/23/2021    AST 22 02/23/2021    ALT 13 02/23/2021    ALKPHOS 103 02/23/2021    BILITOT 0.4 02/23/2021      No results found for: INR     Lab Results   Component Value Date    ANA Positive (A) 08/25/2020       Lab Results   Component Value Date    IRON 69 06/24/2021  FERRITIN 38.9 06/24/2021      Lab Results   Component Value Date    CRP <4.0 08/25/2020    CRP <5.0 08/15/2018      Lab Results   Component Value Date    HBSAG Nonreactive 05/25/2020    HEPBSAB Reactive (A) 05/25/2020    HEPBCAB Nonreactive 05/25/2020    HEPCAB Nonreactive 05/25/2020    HIV Nonreactive 04/21/2021      Lab Results   Component Value Date    CHOL 187 01/22/2020    TRIG 84 01/22/2020    A1C 4.9 08/03/2017        GI procedures:  As above    Radiographic studies:  All recent imaging reviewed.    Information Source   History was obtained from the patient and complete review of the patient's medical records. I have personally reviewed records from the [facility] electronic health record. These are summarized within the history of present illness.

## 2022-02-22 NOTE — Unmapped (Incomplete)
It was a pleasure meeting you today. Here is a summary of our visit for today:  ***    If you have any questions or concerns please contact us on MyChart or as below:     If you do not have MyChart, please sign up at [email or web address]. MyChart. MyChart will allow you to view the notes including next steps and test results. If you have questions before our next visit, please send me an electronic message though MyChart or call the number below. Please note electronic messaging is only for non-urgent questions and response times can vary (3-5 business days).     Important Contact Numbers:  GI Clinic Appointments:  [phone] option 1 then option 1  Please call the GI Clinic appointment line if you need to schedule, reschedule or cancel an appointment in clinic.     GI Procedure Appointments: [phone] option 1 then option 2  Please call the GI Procedures line if you need to schedule, reschedule or cancel ANY type of procedure. You can also call this number for prep instructions, etc.       Radiology: [phone] option 1  If you are being scheduled for any type of radiology, you will need to call to receive your appointment time.  Please call this number for information.      For emergencies after normal business hours or on weekends/holidays   Proceed to the nearest emergency room OR call 911

## 2022-02-23 MED ORDER — IBUPROFEN 800 MG TABLET
ORAL_TABLET | Freq: Three times a day (TID) | ORAL | 0 refills | 30 days | Status: CP
Start: 2022-02-23 — End: 2022-03-25
  Filled 2022-03-03: qty 90, 30d supply, fill #0

## 2022-02-23 NOTE — Unmapped (Signed)
Spoke with patient regarding oxycodone refill, we are unable to refill at this time due to STOP act, patient verbalizes understanding. He has 10 pills left and will continue to wean the medication.     He has complaints of a pinching sensation in his neck that is worse at the end of the day. He has been using ice/heat, icy hot patches, motrin without relief of his symptoms. Tizanidine is helping, he takes it at night. He will plan to start back with P.T. He continues to take his Gabapentin three times per day. Refill sent for ibuprofen.

## 2022-02-24 NOTE — Unmapped (Signed)
Pre called but no answer. Left message with call back instructions and appointment details.

## 2022-02-28 MED ORDER — OXYCODONE 10 MG TABLET
ORAL_TABLET | Freq: Three times a day (TID) | ORAL | 0 refills | 4 days | Status: CP | PRN
Start: 2022-02-28 — End: ?
  Filled 2022-02-28: qty 10, 4d supply, fill #0

## 2022-02-28 NOTE — Unmapped (Signed)
Patient called to report 10/10 neck pain.  This is his, usual pain without oxycodone.  Taking gabapentin, tizanidine, ibuprofen and clonazepam as prescribed for pain.  Took last dose of oxycodone last night.  States he had discussed refill with Legrand Como NP and would like that refill.  Waiting to hear back from [facility] about PT in his area they will cover.  States that when he takes tizanidine, he goes to sleep within ten minutes and wakes up in an hour with severe anxiety.  Has pain clinic appointment in November at [facility].

## 2022-02-28 NOTE — Unmapped (Signed)
Patient Requests Medication Refill    I have personally reviewed the [place] Controlled Substance Reporting Database.     Name of Medication:Oxycodone 10mg  every 8 hours as needed for pain # 10    Last appointment with Vincent Waters 01/26/22  Patient is requesting an appointment this week.     Plan to wean to 5mg  in the next 2-4 weeks

## 2022-03-01 ENCOUNTER — Institutional Professional Consult (permissible substitution): Admit: 2022-03-01 | Discharge: 2022-03-02 | Payer: PRIVATE HEALTH INSURANCE

## 2022-03-01 DIAGNOSIS — L501 Idiopathic urticaria: Principal | ICD-10-CM

## 2022-03-01 MED ADMIN — omalizumab (XOLAIR) injection 300 mg: 300 mg | SUBCUTANEOUS | @ 15:00:00 | Stop: 2022-04-05

## 2022-03-01 NOTE — Unmapped (Signed)
Patient in for Xolair injections today. Received Xolair 300 mg subcutaneously, tolerated injections well. No other complaints at this time.        Specialty

## 2022-03-02 NOTE — Unmapped (Signed)
If you read this I???m just jotting down something???s for tomorrows visit and things that I want to speak on or how I???m feeling . ??   1. At first dr ??Arlyce Dice tried to wing me off all those meds at one time and it through my body into over load .   2. I have been on opioids for 5 years through my first pcp and [place] just picked off where she left off from with giving them with being in constant pain because of a marker for scleroderma which why I get tested every so often for ??in rheumatology   3. I understand that I have to come off the iPods no doubt but the ibuprofen does not work either does the [employer] . I am in horrible pain. I have to wait on physical therapy until they get back to me.   4. I refuse to suffer in pain until I go to the pain clinic that???s not fair and I???m not going to be in pain for anybody . I hope you all are understanding what I am saying . I will talk to u all about this tomorrow.   5. My healing has been out on hold several times and I???m up doing and moving around and driving I reuse to have pain or discomfort because of regulations u til I go to the pain clinic and they deal with it

## 2022-03-03 ENCOUNTER — Ambulatory Visit
Admit: 2022-03-03 | Discharge: 2022-03-04 | Payer: PRIVATE HEALTH INSURANCE | Attending: Specialist | Primary: Specialist

## 2022-03-03 MED ORDER — OXYCODONE-ACETAMINOPHEN 10 MG-325 MG TABLET
ORAL_TABLET | Freq: Three times a day (TID) | ORAL | 0 refills | 15 days | Status: CP | PRN
Start: 2022-03-03 — End: ?
  Filled 2022-03-03: qty 45, 15d supply, fill #0

## 2022-03-03 MED ORDER — GABAPENTIN 600 MG TABLET
ORAL_TABLET | Freq: Three times a day (TID) | ORAL | 3 refills | 90 days | Status: CP
Start: 2022-03-03 — End: 2023-03-03
  Filled 2022-03-10: qty 270, 90d supply, fill #0

## 2022-03-03 NOTE — Unmapped (Signed)
Place patient label here                         [facility]ascular Services at [facility]               Patient Behavior Expectations      Provider: ______________________Patient: __________________________ Date: ______________    [facility] Services at [facility] is dedicated to providing safe, comprehensive care for you and all patients. You are an important part of the care team. Your cooperation and positive communication are essential in helping achieve the best possible outcome.      In order to maintain an effective care relationship, certain behaviors are expected during your treatment with our team:  A patient has the responsibility to treat [facility] providers and staff with consideration and respect, without regard to age, race, ethnicity, color, religion, culture, language, disability, socioeconomic status, sex, sexual orientation, gender identity (or expression), or national origin. This includes refraining from inappropriate, discriminatory, harassing, or abusive language and behavior.  Communication with clinic staff must be through the clinic's office line or MyChart. Staff should not be contacted using personal office phone numbers, emails, or personal home/cell numbers  MyChart messages and phone calls may be used for non-urgent or non-emergent clinical concerns. If you would like to call the clinic for a non-urgent clinical question, you may call Monday - Friday between 08:00am - 03:00 pm. Phone calls and MyChart messages will be returned within a 48- 72-hour period. This time frame allows for weekend coverage, protects clinician time with scheduled patients, and allows for proper response from your provider.   Staff can only assist with Vascular Service concerns. For other non-urgent clinical questions, please contact your PCP. For emergency clinical concerns, you may seek emergency care at any local hospital's emergency department of your choosing.  All communication must remain calm and respectful with staff.  A patient has the responsibility to treat other patients and visitors with consideration and respect, including keeping conversations (in person and by phone), television and music devices at low levels to avoid disturbing others.  A patient has the responsibility to follow [employer], rules, and regulations. This includes the patient's responsibility not to use tobacco products, alcohol or any drugs or medication other than medications administered by [facility] providers.  A patient has the responsibility to cooperate with his/her providers and to provide accurate and complete health information in order to facilitate his/her care and treatment. A patient is expected to ask questions and is responsible for telling caregivers if he/she does not understand his/her care or treatment.  A patient will not demand and [facility] will not provide treatment or services deemed medically unnecessary or inappropriate.  A patient has the responsibility to assure that the financial obligations for his/her health care are fulfilled in a reasonable period-of- time

## 2022-03-03 NOTE — Unmapped (Signed)
Return TOS    Patient Name: Vincent Waters DOB:1981/02/04  Date of Encounter: 03/03/2022    Assessment:  Patient is a 41 y.o. male s/p right TOS decompression with pec minor release 11/16/21. Patient continues to have severe postoperative pain. We recommend that the patient consider TPI injections for the tightness in his shoulder. WE reviewed his prescription of narcotics since his surgery and strongly recommended him to our [facility] , to which he was referred and he an appointment in November. We will prescribe his pain medications until his appointment with Dr. Oda Kilts. We had a lengthy discussion about the tone of communication with clinic staff.     Plan:  - Prescription provided for percocet 10 mg and gabapentin 600 mg.  - Tizanidine should be taken at night before bed to prevent side effects.   - Patient has signed a behavioral contract pledging to act respectfully towards both myself and clinic staff. Patient has pledged to be responsible with narcotic use.  - Patient to start physical therapy.   - RTC on 03/31/22. Return precautions reviewed.     HPI: Vincent Waters is a 41 y.o. 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 Patient reports continued severe pain in their right clavicular region into his chest as well as pain that radiates down his shoulder into his back. Patient has reached out to clinic in order to increase their narcotics prescriptions. He reports that Dr. Arlyce Dice has tried to wean him off but unsuccessfully. He notes that his next appointment with Dr. Oda Kilts at the pain clinic is in November and he is concerned about his medication list in the interim. He notes that his medications are complicated by his pain, ADHD, and possible autism. Patient also reports that his PT is hard to schedule. He notes that he is no longer taking clonazepam, however continues to take motrin and gabapentin. 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 He wishes to build this tissue for both strength and cosmetic regions. Patient has stopped taking his tizanidine due to side effects. Patient notes that he is apologetic about the message he previously sent the clinic as he was unaware that it could be perceived aggressively and is adamant that he was not trying to be confrontational, he was just frustrated with his care.     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 For 4 years, he has had limited shoulder mobility, right neck pain and stiffness, shoulder pain radiating down the arm, and tingling and cramping in the fingers in an ulnar distribution. (Patient initially complained of RUE sx but at follow up endorses a component of LUE sx as well). He was previously seen at [facility] and had glenohumeral injections and PT for possible frozen shoulder with some improvement. Patient saw Dr. Clydene Pugh in Ortho in 09/2019 who felt sx were possibly 2/2 TOS vs. cervical radiculopathy. EMG was ordered at that time but never obtained. Patient has also considered rotator cuff surgery with a provider in [place]. Previous shoulder MRIs have found mild tendinosis of supraspinatus tendon. Patient has tried PT and dry  needling without relief.     Past Medical History:   Past Medical History:   Diagnosis Date    Anxiety     Asthma     Autoimmune autonomic neuropathy     Breast cyst     Financial difficulties     Lack of access to transportation     Migraines     Neuromuscular disorder (CMS-HCC)     Scleroderma (CMS-HCC)     Visual impairment        Past Surgical History:  Past Surgical History:   Procedure Laterality Date    CHOLECYSTECTOMY HYSTERECTOMY Bilateral     08/2017    MASTECTOMY      04/2018    OOPHORECTOMY  2005    laparotomy    PR CYSTOURETHROSCOPY N/A 08/30/2017    Procedure: CYSTOURETHROSCOPY (SEPARATE PROCEDURE);  Surgeon: Forbes Cellar, MD;  [facility];  Service: Advanced Laparoscopy    PR EXCISION TURBINATE,SUBMUCOUS Bilateral 01/04/2021    Procedure: SUBMUCOUS RESECTION INFERIOR TURBINATE, PARTIAL OR COMPLETE, ANY METHOD;  Surgeon: Adam Swaziland Kimple, MD;  [facility]: [facility] OR [facility];  Service: ENT    PR INCISE TENDON/MUSCLE,SHLDR,SINGLE Right 11/16/2021    Procedure: RIGHT PEC MINOR RELEASE;  Surgeon: Thora Lance, MD;  Location: MAIN OR [place];  Service: Vascular    PR LAPAROSCOPY W TOT HYSTERECTUTERUS <=250 GRAM  W TUBE/OVARY N/A 08/30/2017    Procedure: LAPAROSCOPY, SURGICAL, WITH TOTAL HYSTERECTOMY, FOR UTERUS 250 G OR LESS; W/REMOVAL TUBE(S) AND/OR OVARY(S);  Surgeon: Forbes Cellar, MD;  Location: [facility] OR [facility];  Service: Advanced Laparoscopy    PR MASTECTOMY, SIMPLE, COMPLETE Bilateral 01/09/2020    Procedure: MASTECTOMY, SIMPLE, COMPLETE;  Surgeon: Carola Rhine, MD;  Location: MAIN OR [place];  Service: Plastics    PR NEUROPLASTY BRACHIAL PLEXUS,OPEN Right 11/16/2021    Procedure: RIGHT TOS DECOMPRESSION;  Surgeon: Thora Lance, MD;  Location: MAIN OR [place];  Service: Vascular    PR REPAIR OF NASAL SEPTUM N/A 01/04/2021    Procedure: SEPTOPLASTY OR SUBMUCOUS RESECTION, WITH OR WITHOUT CARTILAGE SCORING, CONTOURING OR REPLACEMENT WITH GRAFT;  Surgeon: Adam Swaziland Kimple, MD;  Location: [facility];  Service: ENT    PR UPPER GI ENDOSCOPY,BIOPSY N/A 08/13/2019    Procedure: UGI ENDOSCOPY; WITH BIOPSY, SINGLE OR MULTIPLE;  Surgeon: Alfred Levins, MD;  Location: [facility] PROCEDURES [facility];  Service: Gastroenterology    SINUS SURGERY         Social History:  Patient  reports that he has quit smoking. His smoking use included cigarettes. He has never used smokeless tobacco. He reports that he does not currently use alcohol. He reports current drug use. Drug: Marijuana.    ROS:  Balance of systems reviewed x 10 and negative except as noted in HPI.      DASH Score  Date    03/03/2022     Physical Examination:   Vitals:    03/03/22 1258   BP: 124/81   Pulse: 72   Temp: 37.1 ??C (98.8 ??F)     General: Well appearing, no acute distress  Psych: Awake, Alert , Oriented by 3. Affect is appropriate.  Neck: Supple, no evidence of JVD, no evidence of thyromegaly, no cervical bruits  Cardiac: RRR  Lungs: Easy work of breathing, CTA Bilaterally  Upper extremities: Warm and well perfused. Radial artery pulses bilaterally 2+. Limited RUE ROM and shoulder mobility.     Imaging (independently reviewed):    None  60 minutes spent face-to-face counseling and coordination of care and greater than 50% of the visit was spent face-to-face    ______________________________________________________________________    Documentation assistance was provided by Eugenia Pancoast, Scribe, on March 03, 2022 at 1:19 PM for Doroteo Glassman, Juel Burrow, and Thora Lance, MD    ----------------------------------------------------------------------------------------------------------------------  March 10, 2022 9:03 AM. Documentation assistance provided by the Scribe. I was present during the time the encounter was recorded. The information recorded by the Scribe was done at my direction and has been reviewed and validated by me.  ----------------------------------------------------------------------------------------------------------------------

## 2022-03-10 ENCOUNTER — Ambulatory Visit: Admit: 2022-03-10 | Discharge: 2022-03-11 | Payer: PRIVATE HEALTH INSURANCE

## 2022-03-10 DIAGNOSIS — Z789 Other specified health status: Principal | ICD-10-CM

## 2022-03-10 DIAGNOSIS — IMO0001 Endocrine disorder in female-to-male transgender person: Principal | ICD-10-CM

## 2022-03-10 LAB — TOXICOLOGY SCREEN, URINE
AMPHETAMINE SCREEN URINE: NEGATIVE
BARBITURATE SCREEN URINE: NEGATIVE
BENZODIAZEPINE SCREEN, URINE: NEGATIVE
BUPRENORPHINE, URINE SCREEN: NEGATIVE
CANNABINOID SCREEN URINE: NEGATIVE
COCAINE(METAB.)SCREEN, URINE: NEGATIVE
FENTANYL SCREEN, URINE: NEGATIVE
METHADONE SCREEN, URINE: NEGATIVE
OPIATE SCREEN URINE: NEGATIVE
OXYCODONE SCREEN URINE: POSITIVE — AB

## 2022-03-10 LAB — BASIC METABOLIC PANEL
ANION GAP: 7 mmol/L (ref 5–14)
BLOOD UREA NITROGEN: 8 mg/dL — ABNORMAL LOW (ref 9–23)
BUN / CREAT RATIO: 7
CALCIUM: 9.2 mg/dL (ref 8.7–10.4)
CHLORIDE: 106 mmol/L (ref 98–107)
CO2: 30 mmol/L (ref 20.0–31.0)
CREATININE: 1.08 mg/dL — ABNORMAL HIGH
EGFR CKD-EPI (2021) MALE: 89 mL/min/{1.73_m2} (ref >=60)
GLUCOSE RANDOM: 97 mg/dL (ref 70–99)
POTASSIUM: 3.7 mmol/L (ref 3.4–4.8)
SODIUM: 143 mmol/L (ref 135–145)

## 2022-03-10 MED ORDER — DEXTROAMPHETAMINE-AMPHETAMINE 20 MG TABLET
ORAL_TABLET | Freq: Every day | ORAL | 0 refills | 10 days | Status: CP | PRN
Start: 2022-03-10 — End: 2022-04-09
  Filled 2022-03-11: qty 10, 10d supply, fill #0

## 2022-03-10 MED ORDER — EASY TOUCH LUER LOCK SYRINGE 1 ML
0 refills | 0 days
Start: 2022-03-10 — End: ?

## 2022-03-10 MED ORDER — TRULANCE 3 MG TABLET
ORAL_TABLET | Freq: Every day | ORAL | 2 refills | 30.00000 days
Start: 2022-03-10 — End: ?

## 2022-03-10 MED ORDER — CLONAZEPAM 0.5 MG TABLET
ORAL_TABLET | Freq: Two times a day (BID) | ORAL | 0 refills | 0.00000 days | Status: CP | PRN
Start: 2022-03-10 — End: ?
  Filled 2022-03-11: qty 30, 54d supply, fill #0

## 2022-03-10 NOTE — Unmapped (Signed)
Established Patient Clinic Note    Assessment/Plan:   Mr.Bahr is a 41 y.o.adult who presents for follow-up.    Problem List Items Addressed This Visit          Other    Attention deficit hyperactivity disorder (ADHD), combined type    Relevant Medications    dextroamphetamine-amphetamine (ADDERALL) 20 mg tablet    Other Relevant Orders    Toxicology Screen, Urine (Completed)    Amphetamine, Urine Confirmation (Completed)    Benzodiazepine dependence, continuous (CMS-HCC)    Relevant Medications    clonazePAM (KLONOPIN) 0.5 MG tablet    On pre-exposure prophylaxis for HIV - Primary    Relevant Orders    Basic Metabolic Panel (Completed)    HIV Antigen/Antibody Combo (Completed)    Syphilis Screen (Completed)    Chlamydia/Gonorrhoeae NAA (Completed)     Attending: Dr. Rogelia Boga    # Thoracic outlet syndrome - Chronic pain syndrome  Working with vascular surgery team, PT, and pain specialist (via vascular surgery, will continue following moving forward) to wean off of opioids long-term. Previously took liquid gabapentin nightly for sleep, now taking pill form TID for neuropathic pain, prescribed by SRV; also spacing out oxy from q4h to q6h. I am not currently prescribing any pain meds for him, SRV has taken over for the time being, with plan to transition to pain clinic in November. Please see recent [facility] note from 9/14 for full documentation of pain management plan.   - Continue current management via [facility]  - Transition to pain management clinic in November    # Pre-exposure prophylaxis (PrEP) against HIV  Patient is sexually active transgender male with trans and cis men and women. They have not had condom-less sexual encounters since last visit. No known exposure to HIV. They do not use IV drugs. Condom use: inconsistent. Denies fever, fatigue, skin rash, pharyngitis, cervical lymphadenopathy, or any other symptoms concerning for acute HIV. Based on this information, they are at high risk for contracting HIV and are a good candidate for pre-exposure prophylaxis against HIV. This is a preventive medicine service and E/M modifier 33 should apply to all testing and prescriptions. The lab tests listed below are required to ensure the safety of continuing pre-exposure prophylaxis (PrEP) against HIV.  - Labs drawn today: HIV, Cr, RPR, GC/CT (urine, throat, anal)  - Repeat monitoring labs in 3 months  - Provided adherence and risk reduction / safer sex counseling  - PrEP refill provided today    # Attention deficit hyperactivity disorder (ADHD), combined type  PDMP shows appropriate refills.   - Toxicology Screen, Urine  - Amphetamine, Urine Confirmation  - dextroamphetamine-amphetamine (ADDERALL) 20 mg tablet; Take 1 tablet (20 mg total) by mouth daily as needed.  Dispense: 10 tablet; Refill: 0    # Benzodiazepine dependence, continuous (CMS-HCC)  Clonazepam dose previously increased by SRV to help with muscle spasms following thoracic outlet syndrome surgery. Per pt, [facility] has now asked that I take clonazepam prescription back over. Pt states he is currently taking half a tablet (0.25 mg) of clonazepam twice daily as needed for anxiety. PDMP shows appropriate refills. After speaking with pt, will start clonazepam taper per below.   - clonazePAM (KLONOPIN) 0.5 MG tablet; Take half tablet (0.25 mg total) by mouth once daily as needed for anxiety. For the next two weeks, every other day, may take an additional half a tablet as needed. After two weeks, decrease to taking half a tablet once daily as needed, without  the additional dose  Dispense: 30 tablet; Refill: 0    HEALTH MAINTENANCE ITEMS STILL DUE:  Health Maintenance Due   Topic Date Due    COVID-19 Vaccine (5 - Pfizer series) 05/18/2021    Pneumococcal Vaccine 0-64 (2 - PCV) 06/24/2021     Follow-up: No follow-ups on file.    Future Appointments   Date Time Provider Department Center   03/31/2022 11:15 AM Thora Lance, MD [facility]   04/01/2022  9:00 AM Kern Reap, MD [facility]   04/12/2022  9:00 AM Mena Goes, PsyD [facility]   04/27/2022  9:20 AM Mahoro, Champ Mungo, MD [facility]   04/28/2022  8:20 AM Lobonc, Ammie Ferrier, MD [facility]       Subjective   Mr. Weakland is a 41 y.o. adult  coming to clinic today for the following issues:    Chief Complaint   Patient presents with    Follow-up     Anxiety med     HPI: see above for more information.     I have reviewed the problem list, medications, and allergies and have updated/reconciled them if needed.    Mr. Maison  reports that he has quit smoking. His smoking use included cigarettes. He has never used smokeless tobacco.  Health Maintenance   Topic Date Due    COVID-19 Vaccine (5 - Pfizer series) 05/18/2021    Pneumococcal Vaccine 0-64 (2 - PCV) 06/24/2021    Lipid Screening  01/22/2025    DTaP/Tdap/Td Vaccines (2 - Td or Tdap) 08/19/2027    Hepatitis C Screen  Completed    Influenza Vaccine  Completed       Objective     VITALS: BP 112/82  - Pulse 86  - Temp 36.8 ??C (98.2 ??F) (Temporal)  - Ht 162.6 cm (5' 4)  - Wt 73.9 kg (163 lb)  - LMP 07/20/2017 (Exact Date)  - BMI 27.98 kg/m??     Physical Exam  General: well-appearing, sitting upright in no acute distress  Head: Normocephalic, atraumatic  ENT: No dental trauma noted.   Eyes: conjunctiva normal, non-erythematous, non-icteric, no discharge.  Neck: no thyroid enlargement or masses  Lungs: No increased work of breathing or audible wheezing  Skin: Warm, dry, no erythema or rash on exposed skin  Musculoskeletal: No visible gait abnormalities  Neurologic: Alert & oriented x 3, no gross sensorimotor abnormalities  Psychiatric: Pleasant, cooperative, good eye contact, appropriate thought processes    Wt Readings from Last 3 Encounters:   03/10/22 73.9 kg (163 lb)   03/03/22 73.5 kg (162 lb)   01/26/22 73.9 kg (163 lb)      PHQ-9 PHQ-9 TOTAL SCORE   10/22/2020   9:00 AM 6   09/04/2020   9:00 AM 3   08/21/2020   9:00 AM 5 05/22/2020   3:00 PM 9     PHQ-2 Score: 0    Screening complete, no depression identified / no further action needed today    LABS/IMAGING  I have reviewed pertinent recent labs and imaging in Epic    Alver Fisher, MD, MPH (he/him)  Resident Physician, PGY-3  Department of Family Medicine        [facility]  [address], [place], [place] [address]  Telephone [phone]  Fax [phone]  [email or web address]

## 2022-03-10 NOTE — Unmapped (Signed)
[facility] Administered Medication Refill Coordination Note      NAME:[facility] DOB: [birth year]      Medication: Xolair    Day Supply: 28 days      SHIPPING      Next delivery from [facility] [phone]) to  [facility]  for [facility] is scheduled for 09/27.    Clinic contact: Teodoro Kil    Patient's next nurse visit for administration: 10/10.    We will follow up with clinic monthly for standard refill processing and delivery.      Vincent Waters Samella Parr  Specialty Pharmacy Technician

## 2022-03-10 NOTE — Unmapped (Signed)
Hi Vincent Waters,    It was great to see you today. I look forward to seeing you at your next visit.     I have refilled you for 30 tablets of clonazepam. In order to start weaning you off the clonazepam again, you should start taking HALF a tablet ONCE daily. The next day, you can take HALF a tablet TWICE daily. Then the next day, go back to HALF a tablet ONCE daily, then twice daily, then once daily, etc. After about two weeks, you should switch to taking HALF a tablet ONCE daily only.     In the meantime, for non-urgent questions, please message our clinic on MyChart. I am typically able to respond to these messages within 3-5 business days. For more urgent matters, please call our clinic at [phone]. In the event of a medical emergency, call 911 and/or visit our urgent care or an emergency room.     Take care,    Claudie Revering, MD, MPH   He, him, his  Resident Physician, PGY-3    [facility]   [address]   [place], [place] [address]   [phone] (Phone); [phone] (Fax)     [facility] MyChart is for non-urgent messages. I do my best to respond within 3 business days, however occasionally it may take longer. If you have immediate concerns, contact our clinic by phone [phone].      [facility] has an Urgent Care!  Family Medicine Urgent Care Hours: Monday-Friday 7am-8pm; Saturday and Sunday 12pm-5pm   If you think you are having an emergency, please call 911 or go to your nearest emergency department.

## 2022-03-11 LAB — SYPHILIS SCREEN: SYPHILIS RPR SCREEN: NONREACTIVE

## 2022-03-12 LAB — HIV ANTIGEN/ANTIBODY COMBO: HIV ANTIGEN/ANTIBODY COMBO: NONREACTIVE

## 2022-03-15 LAB — AMPHETAM,UR,CONF
MDA: NEGATIVE ng/mL
MDMA GC/MS CONF: NEGATIVE ng/mL
METHAMPHETAMINE GC/MS URINE: NEGATIVE ng/mL
PHENTERMINE: NEGATIVE ng/mL
PSEUDOEPHEDRINE: NEGATIVE ng/mL

## 2022-03-15 MED ORDER — EASY TOUCH LUER LOCK SYRINGE 1 ML
0 refills | 0 days | Status: CP
Start: 2022-03-15 — End: ?
  Filled 2022-03-17: qty 4, 28d supply, fill #0

## 2022-03-15 MED ORDER — TRULANCE 3 MG TABLET
ORAL_TABLET | Freq: Every day | ORAL | 2 refills | 30 days | Status: CP
Start: 2022-03-15 — End: ?
  Filled 2022-03-17: qty 30, 30d supply, fill #0

## 2022-03-15 MED ORDER — OXYCODONE-ACETAMINOPHEN 10 MG-325 MG TABLET
ORAL_TABLET | Freq: Three times a day (TID) | ORAL | 0 refills | 15 days | PRN
Start: 2022-03-15 — End: ?

## 2022-03-15 MED FILL — XOLAIR 150 MG/ML SUBCUTANEOUS SYRINGE: SUBCUTANEOUS | 28 days supply | Qty: 2 | Fill #2

## 2022-03-16 MED ORDER — OXYCODONE-ACETAMINOPHEN 10 MG-325 MG TABLET
ORAL_TABLET | Freq: Three times a day (TID) | ORAL | 0 refills | 15 days | PRN
Start: 2022-03-16 — End: ?

## 2022-03-16 NOTE — Unmapped (Signed)
Refill not due.

## 2022-03-16 NOTE — Unmapped (Signed)
Hi Vincent Waters,     I hope all is well. You may have seen that your creatinine is a bit elevated, which is likely due to some dehydration. I'd recommend drinking at least 8 full glasses of water per day. We can re-check at your next visit. Nothing to worry about otherwise!    Please let me know if you have any questions or concerns -- you're also welcome to schedule an in-person or virtual visit with me by calling [phone]. Otherwise, I'll see you at your next follow-up visit!    Take care,    Claudie Revering, MD, MPH   He, him, his  Resident Physician, PGY-2    [facility]   [address]   [place], [place] [address]   [phone] (Phone); [phone] (Fax)     [facility] MyChart is for non-urgent messages. I do my best to respond within 3 business days, however occasionally it may take longer. If you have immediate concerns, contact our clinic by phone [phone].   ??  [facility] Medicine has an Urgent Care!  Family Medicine Urgent Care Hours: Monday-Friday 7am-8pm; Saturday and Sunday 12pm-5pm   If you think you are having an emergency, please call 911 or go to your nearest emergency department.

## 2022-03-17 MED ORDER — EMTRICITABINE 200 MG-TENOFOVIR ALAFENAMIDE FUMARATE 25 MG TABLET
ORAL_TABLET | Freq: Every day | ORAL | 0 refills | 90 days | Status: CP
Start: 2022-03-17 — End: ?

## 2022-03-17 NOTE — Unmapped (Signed)
I was available during Dr. Marzetta Board clinic session.  I agree with the assessment and plan as documented in his note. Ulla Gallo, MD

## 2022-03-18 NOTE — Unmapped (Signed)
Patient Advice Request     Patient Name: Vincent Waters  Caller: Self (Patient)  Contact Method: Telephone Call: Time- Any Time [phone]   Reason for Call: Pt called stated he spoke with the pharmacy. About     ibuprofen (MOTRIN) 800 MG tablet Which the surgery prescriber but now they telling him his PCP would have to take him off      Please Advise. No further information given. He would like Dr Arlyce Dice to give him a call  Previously Discussed: yes  Appointment Offered: Declined

## 2022-03-21 MED FILL — IBUPROFEN 800 MG TABLET: ORAL | 20 days supply | Qty: 60 | Fill #2

## 2022-03-24 MED ORDER — OXYCODONE-ACETAMINOPHEN 10 MG-325 MG TABLET
ORAL_TABLET | Freq: Three times a day (TID) | ORAL | 0 refills | 15.00000 days | Status: CN | PRN
Start: 2022-03-24 — End: ?
  Filled 2022-03-24: qty 35, 11d supply, fill #0

## 2022-03-25 ENCOUNTER — Institutional Professional Consult (permissible substitution): Admit: 2022-03-25 | Discharge: 2022-03-26 | Payer: PRIVATE HEALTH INSURANCE

## 2022-03-25 DIAGNOSIS — L501 Idiopathic urticaria: Principal | ICD-10-CM

## 2022-03-25 MED ADMIN — omalizumab (XOLAIR) injection 300 mg: 300 mg | SUBCUTANEOUS | @ 15:00:00 | Stop: 2022-04-05

## 2022-03-25 NOTE — Unmapped (Signed)
Patient in for Xolair injections today. Received Xolair 300 mg subcutaneously, tolerated injections well. No other complaints at this time.      Patient is interested in doing shots at home. Wants to attempt own injections at next visit.

## 2022-04-01 ENCOUNTER — Telehealth: Admit: 2022-04-01 | Discharge: 2022-04-02 | Payer: PRIVATE HEALTH INSURANCE

## 2022-04-01 DIAGNOSIS — F411 Generalized anxiety disorder: Principal | ICD-10-CM

## 2022-04-01 DIAGNOSIS — G540 Brachial plexus disorders: Principal | ICD-10-CM

## 2022-04-01 DIAGNOSIS — G894 Chronic pain syndrome: Principal | ICD-10-CM

## 2022-04-01 MED FILL — OXYCODONE-ACETAMINOPHEN 10 MG-325 MG TABLET: ORAL | 9 days supply | Qty: 25 | Fill #1

## 2022-04-01 NOTE — Unmapped (Signed)
[facility] Administered Medication Refill Coordination Note      NAME:Vincent [facility] DOB: 20-Sep-1980      Medication: Xolair    Day Supply: 28 days      SHIPPING      Next delivery from [facility] [phone]) to  [facility]   for Vincent Waters is scheduled for 10/25.    Clinic contact: Teodoro Kil    Patient's next nurse visit for administration: 11/03.    We will follow up with clinic monthly for standard refill processing and delivery.      Vincent Waters  Specialty Pharmacy Technician

## 2022-04-01 NOTE — Unmapped (Signed)
Hi Vincent Waters,    It was great to see you today. I look forward to seeing you at your next visit. In the meantime, for non-urgent questions, please message our clinic on MyChart. I am typically able to respond to these messages within 3-5 business days. For more urgent matters, please call our clinic at [phone]. In the event of a medical emergency, call 911 and/or visit our urgent care or an emergency room.     Take care,    [name]    [name] Makhi Muzquiz, MD, MPH   He, him, his  Resident Physician, PGY-3    [facility]   [address]   [place], [place] [address]   [phone] (Phone); [phone] (Fax)     [facility] MyChart is for non-urgent messages. I do my best to respond within 3 business days, however occasionally it may take longer. If you have immediate concerns, contact our clinic by phone [phone].      [facility] has an Urgent Care!  Family Medicine Urgent Care Hours: Monday-Friday 7am-8pm; Saturday and Sunday 12pm-5pm   If you think you are having an emergency, please call 911 or go to your nearest emergency department.

## 2022-04-01 NOTE — Unmapped (Signed)
Video Visit Note    This medical encounter was conducted virtually using [email or web address] protocols.    I have identified myself to the patient and conveyed my credentials to Vincent Waters.  Patient/proxy has provided verbal consent to proceed with this video visit.  In case we get disconnected, [phone] (home) [phone] (work)  In case of Emergency, patient's current location: Home: [address] [name]  [place] [address]  Is there someone else in the room? No.     [facility] is a 41 y.o. adult that presents for a video visit today.     Assessment/Plan:       Problem List Items Addressed This Visit          Other    Generalized anxiety disorder    Chronic pain syndrome - Primary    Thoracic outlet syndrome     # Pain management - anxiety  Hasn't had physical therapy in 3 months; states that [facility] stated they would refer him to a specialized PT but he's had trouble arranging this. Was taking ibuprofen 800 mg 3-4 times daily prescribed by [facility], but decreased this because Cr was elevated; now taking half of an 800 mg tablet in the AM (400 mg), then another 400 mg mid-day, then 400 at night, so a total of 1,200 daily. Takes tylenol as needed in addition to percocet, but is trying to wean off of percocet as well. Has stopped muscle relaxant as it made him anxious. Has been trying to taper off clonazepam but this has been difficult given also tapering off of other medications. With this in mind, will stay at current dose of clonazepam for now (0.25 - 0.5 mg daily PRN for anxiety), message [facility] to coordinate PT, and anticipate transfer of pain management to pain clinic in November.   - Continue current management  - Message [facility] to coordinate PT  - Pain clinic on 04/28/22    Followup:  No follow-ups on file.    Subjective:     HPI  Vincent Waters is a 41 y.o. adult requesting a video visit. Please see above for more information.     ROS  As per HPI.    HISTORY  I have reviewed the patient's problem list, current medications, and allergies and have updated/reconciled them as needed.    The patient reports they are currently at home. I spent 20 minutes on the real-time audio/video visit with the patient on the date of service. I spent an additional 15 minutes on pre- and post-visit activities on the date of service. The patient was physically located in [place] or a state in which I am permitted to provide care. The patient and/or parent/guardian understood that s/he may incur co-pays and cost sharing, and agreed to the telemedicine visit. The visit was reasonable and appropriate under the circumstances given the patient's presentation at the time. The patient and/or parent/guardian has been advised of the potential risks and limitations of this mode of treatment (including, but not limited to, the absence of in-person examination) and has agreed to be treated using telemedicine. The patient's/patient's family's questions regarding telemedicine have been answered. If the visit was completed in an ambulatory setting, the patient and/or parent/guardian has also been advised to contact their provider???s office for worsening conditions, and seek emergency medical treatment and/or call 911 if the patient deems either necessary.    Objective:   Patient Reported Blood Pressure Readings    General: Well appearing , in no acute  distress  Skin: Color is normal. No rashes.  Psych: Appropriate affect, normal mood  Resp: Normal work of breathing, no retractions    Alver Fisher, MD, MPH (he/him)  Resident Physician, PGY-3  Department of Family Medicine

## 2022-04-02 NOTE — Unmapped (Signed)
This was a telehealth service where a resident was involved. I was immediately available via telephone/pager.

## 2022-04-07 DIAGNOSIS — F1320 Sedative, hypnotic or anxiolytic dependence, uncomplicated: Principal | ICD-10-CM

## 2022-04-07 DIAGNOSIS — F902 Attention-deficit hyperactivity disorder, combined type: Principal | ICD-10-CM

## 2022-04-07 MED ORDER — CLONAZEPAM 0.5 MG TABLET
ORAL_TABLET | 0 refills | 0 days
Start: 2022-04-07 — End: ?

## 2022-04-07 MED ORDER — DEXTROAMPHETAMINE-AMPHETAMINE 20 MG TABLET
ORAL_TABLET | Freq: Every day | ORAL | 0 refills | 10 days | PRN
Start: 2022-04-07 — End: 2022-05-07

## 2022-04-08 DIAGNOSIS — G540 Brachial plexus disorders: Principal | ICD-10-CM

## 2022-04-08 MED ORDER — CLONAZEPAM 0.5 MG TABLET
ORAL_TABLET | 0 refills | 0 days | Status: CP
Start: 2022-04-08 — End: ?
  Filled 2022-04-13: qty 30, 30d supply, fill #0

## 2022-04-08 MED ORDER — DEXTROAMPHETAMINE-AMPHETAMINE 20 MG TABLET
ORAL_TABLET | Freq: Every day | ORAL | 0 refills | 10 days | Status: CP | PRN
Start: 2022-04-08 — End: 2022-05-08
  Filled 2022-04-13: qty 10, 10d supply, fill #0

## 2022-04-12 MED FILL — XOLAIR 150 MG/ML SUBCUTANEOUS SYRINGE: SUBCUTANEOUS | 28 days supply | Qty: 2 | Fill #3

## 2022-04-18 MED ORDER — IBUPROFEN 800 MG TABLET
ORAL_TABLET | Freq: Three times a day (TID) | ORAL | 2 refills | 20 days | PRN
Start: 2022-04-18 — End: ?

## 2022-04-19 MED ORDER — IBUPROFEN 800 MG TABLET
ORAL_TABLET | Freq: Three times a day (TID) | ORAL | 2 refills | 20 days | Status: CP | PRN
Start: 2022-04-19 — End: ?
  Filled 2022-04-20: qty 60, 20d supply, fill #0

## 2022-04-20 DIAGNOSIS — Z789 Other specified health status: Principal | ICD-10-CM

## 2022-04-20 MED ORDER — TESTOSTERONE CYPIONATE 200 MG/ML INTRAMUSCULAR OIL
SUBCUTANEOUS | 0 refills | 70 days | Status: CP
Start: 2022-04-20 — End: 2022-06-29
  Filled 2022-04-22: qty 1, 56d supply, fill #0

## 2022-04-26 MED ORDER — GABAPENTIN 250 MG/5 ML ORAL SOLUTION
Freq: Three times a day (TID) | ORAL | 3 refills | 30 days | Status: CP
Start: 2022-04-26 — End: ?
  Filled 2022-04-27: qty 1080, 30d supply, fill #0

## 2022-04-27 ENCOUNTER — Ambulatory Visit: Admit: 2022-04-27 | Discharge: 2022-04-27 | Payer: PRIVATE HEALTH INSURANCE

## 2022-04-27 ENCOUNTER — Institutional Professional Consult (permissible substitution): Admit: 2022-04-27 | Discharge: 2022-04-27 | Payer: PRIVATE HEALTH INSURANCE

## 2022-04-27 ENCOUNTER — Ambulatory Visit
Admit: 2022-04-27 | Discharge: 2022-04-27 | Payer: PRIVATE HEALTH INSURANCE | Attending: Student in an Organized Health Care Education/Training Program | Primary: Student in an Organized Health Care Education/Training Program

## 2022-04-27 DIAGNOSIS — L501 Idiopathic urticaria: Principal | ICD-10-CM

## 2022-04-27 DIAGNOSIS — K909 Intestinal malabsorption, unspecified: Principal | ICD-10-CM

## 2022-04-27 DIAGNOSIS — K5909 Other constipation: Principal | ICD-10-CM

## 2022-04-27 DIAGNOSIS — K3184 Gastroparesis: Principal | ICD-10-CM

## 2022-04-27 MED ORDER — PRUCALOPRIDE 2 MG TABLET
ORAL_TABLET | Freq: Every day | ORAL | 0 refills | 90 days | Status: CP
Start: 2022-04-27 — End: ?

## 2022-04-27 MED ADMIN — omalizumab (XOLAIR) injection 300 mg: 300 mg | SUBCUTANEOUS | @ 17:00:00

## 2022-04-27 NOTE — Unmapped (Signed)
------------------------------------------------------------------------------    Phone numbers you may need:    For follow-up appointments call the GI clinic schedulers at [phone].   For urgent follow-up appointments ask me on MyChart or ask my nurse, Noe Gens, at [phone].    For scheduling GI procedures (Upper endoscopy, colonoscopy, esophageal manometry, pH/impedence test) call the the GI procedures schedulers at [phone].  For scheduling radiology exams (Barium tests, CT scans, MRIs) call Radiology at [phone].  For scheduling exams or procedures call interventional radiology (VIR) at [phone].  For scheduling lab draws (blood, stool, urine) call [facility] at [phone].    For non-urgent clinical questions, use MyChart, or call my nurse, Noe Gens, at [phone].  For urgent clinical questions call my nurse, Noe Gens, at [phone].  For urgent questions after hours you can call [phone] and ask for the GI medicine fellow on call.  For emergencies call 911 and present to the emergency department.    ==============================================

## 2022-04-27 NOTE — Unmapped (Signed)
Patient in for Xolair injections today. Received Xolair 300 mg subcutaneously, tolerated injections well. Patient was shown how to administer his own injections. Patient did a return demonstration, but wants to come into the clinic again for more practice. Injection resources given.     No other complaints at this time.

## 2022-04-27 NOTE — Unmapped (Signed)
[facility] [place]  [address]  [place] [address]      NEW CONSULTATION CLINIC VISIT    REFERRING PROVIDER: Trixie Rude, MD  [address]  [id number]   [facility] [address]  [place],  [place] [address]    PRIMARY CARE PROVIDER: Kern Reap, MD    ASSESSMENT AND PLAN:     Vincent Waters is a 41 y.o. adult new patient with past medical history significant for gastroparesis, constipation, dysphagia, throracic outlet syndrome on chronic narcotics, benzos , prior cholecystectomy, ADHD presenting for evaluation of constipation.    Chronic constipation  Gastroparesis  Dysphagia  Years of dysphagia, abdominal pain and constipation. Has had 2 EGDs, most recently 07/2019 with 3cm hiatal hernia and significant gastroparesis with retained food, examination c/b laryngospasm. Colonoscopy in 2017 was normal. GES in 2019 prior to narcotics with 22% retention at 4 hours. Seems to be having 4-5 loose stool on trulance (bristol 5-6). Prior workup including TSH and A1c normal. He has elevated ANA 1:640 and was evaluated by rheumatology without concern for systemic connective tissue disease. He is on chronic narcotics and benzos though currently weaning narcotics. Suspect meds are contributing to his symptoms.    Will switch from trulance to prucalopride and rule out SIBO.  - AXR to rule out overflow  - Hydrogen Breath Test; Future  - Will consider erythromycin/reglan if no improvement.    Return in about 3 months (around 07/28/2022).    SUBJECTIVE:     CHIEF COMPLAINT: Constipation    HISTORY OF PRESENT ILLNESS:  15 transgender male PMHx gastroparesis, constipation, dysphagia, throracic outlet syndrome on chronic narcotics, benzos , prior cholecystectomy, ADHD presenting for evaluation of constipation.    He was previously followed by Dr. Luan Pulling. He has a history of gastroparesis, dysphagia and constipation. He was evaluated extensively by rheumatolgy due to elevated ANA but workup was unrevealing. He reports regurgitation at night, that is believed to be 2/2 gastroparesis. He has CPAP and sometimes has emesis in the mask. He is on trulance and this helped with constipation. Without trulance he can go up to a week without a BM, then has diarrhea after a while. On trulance, has about 5BMs on most days, bristol 5-6. He denies weight loss, blood in stool. He continues to have dysphagia about 2-3 times a week. He has been on percocet for 3 years though is currently being weaned. He is on klonopin. He takes NSAIDs rarely.     EGD 07/2019 with 3cm hiatal hernia and significant gastroparesis with retained food, examination c/b laryngospasm. Colonoscopy in 2017 was normal. GES in 2019 prior to narcotics with 22% retention at 4 hours. He is following with a nutritionist.    He has a sister with crohns disease. Great grandmother had colon cancer.    REVIEW OF SYSTEMS:   The balance of 12 systems reviewed is negative except as noted in the history of present illness.    PROVIDER REVIEWED WITH PATIENT AND UPDATED IN EMR:        OBJECTIVE:   VITAL SIGNS: BP 141/87 (BP Site: L Arm, BP Position: Sitting, BP Cuff Size: Medium)  - Temp 37.4 ??C (99.4 ??F) (Temporal)  - Ht 164 cm (5' 4.57)  - Wt 73.9 kg (163 lb)  - LMP 07/20/2017 (Exact Date)  - BMI 27.49 kg/m??     Wt Readings from Last 5 Encounters:   04/27/22 73.9 kg (  163 lb)   04/01/22 73.9 kg (163 lb)   03/10/22 73.9 kg (163 lb)   03/03/22 73.5 kg (162 lb)   01/26/22 73.9 kg (163 lb)       PHYSICAL EXAM:  General appearance: Appears well, no distress.  Pulmonary: Normal work of breathing. Acyanotic.  Abdominal: soft, nontender, nondistended, no masses or organomegaly.  Skin: No jaundice. No rashes.  Neurologic: Alert, oriented, and appropriate.  Psychiatric: Appropriate.

## 2022-04-28 ENCOUNTER — Ambulatory Visit
Admit: 2022-04-28 | Discharge: 2022-04-29 | Payer: PRIVATE HEALTH INSURANCE | Attending: Anesthesiology | Primary: Anesthesiology

## 2022-04-28 DIAGNOSIS — G540 Brachial plexus disorders: Principal | ICD-10-CM

## 2022-04-28 MED ORDER — BACLOFEN 10 MG TABLET
ORAL_TABLET | Freq: Two times a day (BID) | ORAL | 1 refills | 30 days | Status: CP | PRN
Start: 2022-04-28 — End: ?
  Filled 2022-04-28: qty 60, 30d supply, fill #0

## 2022-04-28 NOTE — Unmapped (Addendum)
Start baclofen 5-10mg  oral tablet twice daily as needed for muscle spasms  We ordered a urine toxicology   We ordered you to have trigger point injections to help with muscle tightness  Continue weaning off clonazepam   We referred you to pain psychology  We will consider medication changes at the next appointment

## 2022-04-28 NOTE — Unmapped (Addendum)
Department of Anesthesiology  [facility]  [address], [address]  [place], [place] [address]  [phone]    Chronic Pain Follow-Up Note  1. TOS (thoracic outlet syndrome)    2. Chronic pain syndrome    3. Cervicalgia    4. Myofascial pain      Assessment and Plan  Jon [facility] is a 41 y.o. 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 Gayla Doss in May 2023.  He was first seen by our clinic in March 2023 for diagnostic injections.  His range of motion has an increased postoperatively, but still has tight, spasming pain in the right neck and shoulder.  He is currently weaning off benzodiazepines, discontinued opioid medications about a week ago which she was chronically on for 3 years, and inquires about optimizing medications and procedures to improve his baseline pain at this time.  His primary pain generator includes myofascial pain post-TOS decompression.  He has other medical history that includes anxiety, scleroderma, migraines, autoimmune autonomic neuropathy, asthma.    November 2023  At initial visit in March, the patient had multiple generators of pain but presented today a part of their TOS workup.  They were on opioids at baseline.    Today, patient presents with worsened pain localized to right neck and shoulder area. He denies radiation of pain down his RUE. His pain seems to have a myofascial component and we discussed TPIs and initiation of baclofen to assist with myofascial pain.  Patient notes benefit with gabapentin liquid and reports being on this medication for several years. He is currently weaning his clonazepam with his PCP. Patient endorses withdrawal symptoms as he notes stopping Percocet last week after 3 years. We had discussion about the longevity of TOS surgery and that the healing process could take up a long duration to achieve benefit.  The patient's GI team stated to avoid NSAIDs, we would agree as this upsets his stomach and he has a history of gastroparesis.  The patient was not amenable to a pain psychology evaluation, UDS today in order to consider initiation of a buprenorphine product in the future should he wean off of benzodiazepines, fail more conservative management, and continue to have severe, uncontrolled pain.  But for now hopefully we can help improve pain with more conservative measures.    Current Pain Provider: PCP previously prescribed opioids  Urine toxicology: 02/2022  Urine toxicology screen was appropriate.   Last Opioid Change: Last Rx in 02/2022  Last EKG: N/A  Previous Compliance Issues: None  Naloxone ordered: no  Total morphine equivalents: 0  Benzodiazepine: Yes. Weaning.  Pain Psychology: Consider referral in the future-discussed today, patient declined.  [place] database was reviewed 04/28/22.          PLAN:   -Continue following with physical therapy (appointment to reestablish physical therapy sessions is next week)  - Ordered TPIs  - Obtain UDS-possible benzo and oxycodone  - Referral to Pain Psych for COM-patient declined  - Continue gabapentin 600 mg oral solution TID  - Continue clonazepam taper with outside provider  - Start baclofen 5-10 mg BID prn   - Stop tizanidine 4 mg prn    Considerations for the future:  -Consider initiation of Butrans/buprenorphine product after weaning off of benzodiazepines and pain psych evaluation for COM  -Consider up titration of gabapentin oral solution or rotation to Lyrica   -  Consider uptitration of baclofen frequency to 3 times daily as needed    Return in about 5 weeks (around 06/02/2022) for MD or  NP, 4-6 weeks, okay for brown to follow-up too .    Requested Prescriptions     Signed Prescriptions Disp Refills    baclofen (LIORESAL) 10 MG tablet 60 tablet 1     Sig: Take 0.5-1 tablets (5-10 mg total) by mouth two (2) times a day as needed for muscle spasms.       Orders Placed This Encounter   Procedures    Trigger Point Injs 3+ Muscles [phone])     Lobonc, 15 minutes, TPI, spine or quad, next available, no piv, no driver     Standing Status:   Future     Standing Expiration Date:   04/29/2023    Drug Screen, [facility], Urine     Order Specific Question:   Patient's Current Medications     Answer:   Not provided     Order Specific Question:   Release to patient     Answer:   Immediate [1]    Ambulatory referral to Pain Psychology     Standing Status:   Future     Standing Expiration Date:   04/29/2023     Referral Priority:   Routine     Referral Type:   Generic Referral     Number of Visits Requested:   1         HPI:  Mr. Asfour is seen in consultation at the request of Dr. Gayla Doss for evaluation and recommendations regarding His pain.     Patient was initially seen in March. Since then, he underwent right anterior scalene and pec minor injections with our clinic on 09/08/21 and 09/10/21. He had right TOS decompression with Vascular on 11/16/21. He reached out via MyChart reporting he would like to switch back to liquid form of gabapentin due to absorption and Hx of gastroparesis, as well as informing our clinic he is going through withdrawal after coming off percocet.     Today, the patient presents with overall worsened right shoulder pain after having right TOS decompression in May. He describes the pain as stabbing and throbbing. He reports he has had some events that have really slowed his healing process. The patient states his discharge summary after surgery said he could lift up to 20 lbs, though states this was incorrect and it should have read 5 lbs. He reports he is not sleeping any more than 2-3 hours per night due to pain and withdrawal. The patient reports he was participating in PT, though notes they did massage and suction cup that ended up exacerbating his pain. He states Dr. Gayla Doss had him put a hold on PT to heal further before restarting. The patient notes he is starting PT again, next week. The patient reports doing stretches and exercises at home. He reports stiffness and tightness in his right shoulder/neck area. The patient reports that his quality of life has diminished significantly, though notes he continues to get through his days and do what he needs to do. He inquired about injections in his right shoulder/neck area in hopes of relief with pain and tightness. He reports Dr. Gayla Doss sent him to our clinic for pain control as there is nothing left after surgery for him to do.     In terms of medications, the patient reports stopping Percocet last week and notes feeling withdrawals symptoms. He states he was  not weaned off of it. The patient reports he stopped Percocet cold [place] after 3 years. The patient reports Dr. Gayla Doss sent in liquid gabapentin as of yesterday. He reports using Motrin, though notes GI suggests he stop due to gastroparesis. The patient reports using Voltaren gel with benefit. He confirms trying Lyrica in the past. He reports he is currently tapering off clonazepam. The patient reports trying to take a tizanidine for sleep, though notes no benefit. The patient reports he has been on gabapentin for several years and notes benefit.         Current Medications:  Percocet, related to multiple pains- no longer taking  Gabapentin 600 mg TID- has been on for years  Voltaren gel     Current view: Showing all answers           [facility] Return Patient Questionnaire       Question 04/28/2022  3:40 AM EST - Filed by Patient    What is the reason for your visit? Post procedure assessment    Date of onset of your pain: 11/16/2021    Please rate your pain at its WORST in the past month. 8    Please rate your pain at its LEAST in the past month. 5    Please rate your pain as it is RIGHT NOW. 7    Please rate your pain on AVERAGE in the past month. 9    Please circle the location of your pain.       Please select the words that describe your pain. Aching     Pressing     Pulling     shooting     Stabbing Tender Throbbing    How often do you have pain? All the time    When is your pain the worst? Evenings    Which of the following have been negatively affected by your pain? Enjoyment of life     General activity     Mood     Sleep    Since your last visit:     Have you had any of the following? Primary Care Visit     New pain medication prescribed    Do you have any new pain you would like to discuss with your doctor? Yes    How has your pain changed? Worse    Are you currently taking any blood-thinners or anticoagulants? No    If you are on Pain Medication - Are you having any of the following? Dry mouth     Change in Sleep Patterns     Agitation    If you have had a procedure since your last visit, how much pain relief was obtained? 40%    If you have had a procedure since your last visit, were there any complications? The discharge instructions were weonf    General: Night Sweats     Difficulty Sleeping    Cardiovascular:       Gastrointestinal - (Intestinal): Constipation     Heartburn     Gastric Reflux    Skin: Rash     Hives     Redness     Itching    Endocrine (Hormonal System): Increased Sweating     Hot Flashes     Thinning hair     Intolerance to heat or cold    Musculoskeletal System - (Muscles, Joints and Coverings): Spasms/Spasticity/Cramps     Muscle Aches/Weakness    Neurologic: Dizziness/Vertigo  Numbness/Tingling    Psychiatric: Anxiety     Low Concentration     Isolating Yourself             Previous Responses        Op Gct Opt Outs       Question 10/22/2021  9:04 AM EST - Filed by Patient    Would you like to be excluded from the patient list? Yes    Would you like for [place] to withhold information about you from your family and friends? No    Would you like for [place] to withhold information about you from community clergy? No              Previous Medication Trials:  NSAIDS- ibuprofen  Antidepressants-   Anticonvulsant- gabapentin  Muscle relaxants- flexeril, baclofen  Topicals-voltaren gel  Short-acting opiates-percocet   Long-acting opiates-   Anxiolytics-   Other-     Previous Interventions  Medications, PT, surgery  TOS decompression/PMR 11/16/21  TPI-TBD      Allergies as of 04/28/2022 - Reviewed 04/28/2022   Allergen Reaction Noted    Latex Rash and Hives 09/16/2016    Penicillins Anaphylaxis 09/16/2016    Shellfish containing products Anaphylaxis 01/23/2017    Other Rash 12/16/2020    Latex, natural rubber Rash 08/20/2015    Penicillin Rash 02/14/2018      Current Outpatient Medications   Medication Sig Dispense Refill    azelastine (ASTELIN) 137 mcg (0.1 %) nasal spray Instill 2 sprays into each nostril two (2) times a day as needed for rhinitis. 30 mL 0    budesonide-formoteroL (SYMBICORT) 160-4.5 mcg/actuation inhaler Inhale 1 puff by mouth Two (2) times a day. 10.2 g 5    cetirizine (ZYRTEC) 10 MG tablet Take 1 tablet (10 mg total) by mouth daily. 30 tablet 12    clindamycin (CLEOCIN T) 1 % lotion 1 application. as needed.      clonazePAM (KLONOPIN) 0.5 MG tablet Take half tablet (0.25 mg total) by mouth once daily as needed for anxiety. For the next two weeks, every other day, may take an additional half a tablet as needed. After two weeks, decrease to taking half a tablet once daily as needed, without the additional dose 30 tablet 0    dextroamphetamine-amphetamine (ADDERALL) 20 mg tablet Take 1 tablet (20 mg total) by mouth daily as needed. 10 tablet 0    emtricitabine-tenofovir alafen (DESCOVY) 200-25 mg tablet Take 1 tablet by mouth daily. 90 tablet 0    EPINEPHrine (EPIPEN) 0.3 mg/0.3 mL injection Inject 0.3 mL (0.3 mg total) under the skin once for 1 dose. 1 each 1    famotidine (PEPCID) 40 MG tablet Take 1 tablet (40 mg total) by mouth every evening. 30 tablet 1    gabapentin (NEURONTIN) 250 mg/5 mL oral solution Take 12 mL (600 mg total) by mouth Three (3) times a day. 1080 mL 3    ibuprofen (MOTRIN) 800 MG tablet Take 1 tablet (800 mg total) by mouth every eight (8) hours as needed for pain. 60 tablet 2    inhalational spacing device Spcr Use as directed with albuterol and symbicort 1 each 1    methylPREDNISolone (MEDROL DOSEPACK) 4 mg tablet follow package directions 21 each 0    needle, disp, 25 Annasophia (BD REGULAR BEVEL NEEDLES) 25 Breanda x 5/8 Ndle For subcutaneous hormone injection. 25 each 0    nifedipine 0.3% lidocaine 1.5% in petrolatum ointment Apply a pea-size amount to anal area 3  times a day 100 g 0    omalizumab (XOLAIR) 150 mg/mL syringe Inject 2 mL (300 mg total) under the skin every twenty-eight (28) days. 2 mL 12    oxyCODONE (ROXICODONE) 10 mg immediate release tablet Take 1 tablet (10 mg total) by mouth every six (6) hours as needed for pain. 50 tablet 0    oxyCODONE (ROXICODONE) 10 mg immediate release tablet Take 1 tablet (10 mg total) by mouth every eight (8) hours as needed for pain. 10 tablet 0    oxyCODONE-acetaminophen (PERCOCET) 10-325 mg per tablet Take 1 tablet by mouth every eight (8) hours as needed for pain. 60 tablet 0    pantoprazole (PROTONIX) 20 MG tablet Take 1 tablet (20 mg total) by mouth daily. 30 tablet 1    prucalopride 2 mg Tab Take 1 tablet (2 mg total) by mouth daily. 90 tablet 0    safety needles (BD SAFETYGLIDE NEEDLE) 18 Verlin x 1 1/2 Ndle For drawing hormone injection 25 each 0    syringe, disposable, (EASY TOUCH LUER LOCK SYRINGE) 1 mL Syrg Use for weekly hormone injection. 4 each 0    syringe, disposable, 2.5 mL Syrg Use weekly 30 Syringe 2    testosterone cypionate (DEPOTESTOTERONE CYPIONATE) 200 mg/mL injection Inject 0.2 mL (40 mg total) under the skin every seven (7) days. 2 mL 0    tiZANidine (ZANAFLEX) 4 MG tablet Take 1 tablet (4 mg total) by mouth every six (6) hours as needed. 30 tablet 3    triamcinolone (KENALOG) 0.1 % cream Apply topically Two (2) times a day. 15 g 0    baclofen (LIORESAL) 10 MG tablet Take 0.5-1 tablets (5-10 mg total) by mouth two (2) times a day as needed for muscle spasms. 60 tablet 1     Current Facility-Administered Medications   Medication Dose Route Frequency Provider Last Rate Last Admin    omalizumab [name]) injection 300 mg  300 mg Subcutaneous Q28 Days [name], [name], [name]   300 mg at 04/27/22 1202       Imaging/Tests:     EXAM: MRA CHEST W WO CONTRAST  DATE: 09/03/2021 9:22 AM  ACCESSION: [id number] UN  DICTATED: 09/03/2021 9:23 AM  INTERPRETATION LOCATION: [facility]     CLINICAL INDICATION: 41 years old Male with thoracic outlet syndrome  - G54.0 - Thoracic outlet syndrome - G54.0 - Brachial plexopathy without trauma       COMPARISON: Same day MRI brachial plexus, MRI right shoulder 09/20/2019     TECHNIQUE: Multiphase MR angiography of the proximal bilateral upper extremities and chest was performed as per the thoracic outlet syndrome protocol. Initially, the patient was positioned with their arms extended above the head, and axial MR images were obtained during contrast administration. Subsequently, the patient was repositioned with their arms placed in the neutral position at their side, and axial MR images were obtained during contrast administration Multiplanar reformatted and MIP images were provided for further evaluation of the vessels.     VASCULAR FINDINGS:     GREAT VESSELS: Patent three-vessel branching pattern.       THORACIC AORTA: Normal caliber.     PULMONARY ARTERIES/VEINS: No filling defects.     HEART: Normal heart size.      RIGHT: There is no dynamic narrowing of the thoracic outlet during stress positioning, noting no substantial visual change in vascular short axis dimension. No well-developed venous collaterals are seen about the shoulder girdle. No evidence of thrombosis.  There is non  dynamic narrowing of the left axillary vein.     LEFT: There is no dynamic narrowing of the thoracic outlet during stress positioning, noting no substantial visual change in vascular short axis dimension. No well-developed venous collaterals are seen about the shoulder girdle. No evidence of thrombosis.     NON-VASCULAR FINDINGS:     LOWER NECK: Unremarkable.     LUNGS: Unremarkable.     PLEURA: No pleural effusion, thickening, or pneumothorax.     MEDIASTINUM AND LYMPH NODES: No enlarged thoracic lymph nodes in the partially imaged mediastinum.     BONES AND SOFT TISSUES: No cervical ribs are seen. The transverse processes of C7 are not hypertrophied relative to the transverse processes of T1. Serpiginous signal abnormality within the right right humeral head (106:28), new since prior MRI shoulder. There is asymmetric enhancement within the right subacromial bursa.     IMPRESSION:     No evidence of thoracic outlet syndrome.     Serpiginous signal abnormality within the right right humeral, new since prior MRI shoulder, with thickening of the subacromial bursa which may represent avascular necrosis and bursitis. A component of adhesive capsulitis is also likely present.  Consider a dedicated MRI of the right shoulder.     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 contrast.     FINDINGS:    No mass or abnormal enhancement along the course of the brachial plexi. Cervical nerve roots appear unremarkable. The supraclavicular and axillary regions appear normal. No lymphadenopathy.     There is minimal dynamic narrowing of the proximal subclavian veins at the level of the costoclavicular space with arm abduction which is likely physiologic.     Multiple small disc osteophyte complexes with mild bilateral foraminal narrowing at C6-7. No additional spinal canal or foraminal stenosis. No abnormal spinal cord signal.     IMPRESSION:     1. Minimal dynamic narrowing of the proximal subclavian veins with arm abduction is likely physiologic but can be seen with thoracic outlet syndrome.     2. Multilevel cervical spondylosis with mild bilateral foraminal narrowing at C6-7.    Lab Results   Component Value Date    PLT 272 11/19/2021     Lab Results   Component Value Date    CREATININE 1.08 (H) 03/10/2022     Lab Results   Component Value Date    ALKPHOS 103 02/23/2021    BILITOT 0.4 02/23/2021    PROT 7.1 02/23/2021    ALBUMIN 4.1 02/23/2021    ALT 13 02/23/2021    AST 22 02/23/2021       ROS:  See questionnaire above.          PHYSICAL EXAM:  BP 132/90  - Pulse 77  - Temp 36.8 ??C (98.2 ??F) (Skin)  - Resp 16  - Ht 162.6 cm (5' 4)  - Wt 74.1 kg (163 lb 4.8 oz)  -  LMP 07/20/2017 (Exact Date)  - SpO2 99%  - BMI 28.03 kg/m??     Wt Readings from Last 3 Encounters:   04/28/22 74.1 kg (163 lb 4.8 oz)   04/27/22 73.9 kg (163 lb)   04/01/22 73.9 kg (163 lb)     GENERAL: Well developed, well-nourished male and is in no apparent distress. The patient is pleasant and interactive. Patient is a good historian.  No evidence of sedation or intoxication.    HEENT: Normocephalic/atraumatic.   CARDIOVASCULAR:  RRR, no murmur  RESPIRATORY:  Normal work of breathing, no supplemental 02  EXTREMITIES: No clubbing, cyanosis noted.  NEUROLOGIC:  Alert and oriented, speech fluent, normal language. Cranial nerves grossly intact.    MUSCULOSKELETAL:  Motor function preserved as previous in extremities.  Patient rises from a seated position with no difficulty.  The patient was able to ambulate with no difficulty throughout the clinic today without the assistance of a walking aid-None.    SKIN: No obvious rashes, lesions, or erythema.  PSY: Appropriate, full range affect, no psychomotor retardation    Documentation assistance was provided by Martie Lee, on April 28, 2022 at 8:21 AM for Dr. Marcelle Smiling, MD.      The documentation recorded by the scribe accurately reflects the service I personally performed and the decisions made by me. Betsey Amen, MD       Medical Decision Making    Problems Addressed:  1 or more chronic illness w/ exacerbation, progression, or side effects of treatment (moderate)    Amount/Complexity:  Reviewed external records:  vascular surgery 03/03/22 and [place] PDMP (today on day of visit)  Reviewed results:  labs as above  Ordered tests:  UDS  Assessment requiring independent historian: None  Discussion of management/test results with medical professional: None  Independent interpretation of test: None    Risk  Moderate: Prescription Drug Management, Minor surgery w/ risk factors, Elective Major Surgery w/o risk factors, Diagnosis/treatment significantly limited by social determinants of health

## 2022-04-29 ENCOUNTER — Ambulatory Visit: Admit: 2022-04-29 | Discharge: 2022-04-30 | Payer: PRIVATE HEALTH INSURANCE

## 2022-04-29 NOTE — Unmapped (Signed)
Hi Sueo,    It was great to see you today. I look forward to seeing you at your next visit. In the meantime, for non-urgent questions, please message our clinic on MyChart. I am typically able to respond to these messages within 3-5 business days. For more urgent matters, please call our clinic at [phone]. In the event of a medical emergency, call 911 and/or visit our urgent care or an emergency room.     Take care,    Claudie Revering, MD, MPH   He, him, his  Resident Physician, PGY-3    [facility]   [address]   [place], [place] [address]   [phone] (Phone); [phone] (Fax)     [facility] MyChart is for non-urgent messages. I do my best to respond within 3 business days, however occasionally it may take longer. If you have immediate concerns, contact our clinic by phone [phone].      [facility] has an Urgent Care!  Family Medicine Urgent Care Hours: Monday-Friday 7am-8pm; Saturday and Sunday 12pm-5pm   If you think you are having an emergency, please call 911 or go to your nearest emergency department.

## 2022-04-29 NOTE — Unmapped (Signed)
[facility]- [facility]  Established Patient Clinic Note    Assessment/Plan:   Vincent Waters is a 41 y.o.adult who presents for follow-up.    Problem List Items Addressed This Visit       Benzodiazepine dependence, continuous (CMS-HCC) - Primary     Other Visit Diagnoses       Greater trochanteric bursitis of left hip        Anxiety              Attending: Dr. Pascal Lux    # Pain management - anxiety - benzodiazepine dependence   Starts physical therapy again next week. Has finally established with pain clinic, which he is very happy about; they are now managing all of his pain medications and I will continue to manage benzo taper. Came off of opiates cold [place] last week, hasn't had a percocet since this past Wednesday; initially experienced severe anxiety and restlessness and insomnia, all of which have improved. Is back on liquid gabapentin TID and baclofen per pain clinic, which has helped with both pain and sleep. Has been taking half a tablet of benadryl at night to help with allergy symptoms and this has helped him fall and stay asleep as well. Of note, did previously take lemborexant prescribed by prior PCP; can consider this if insomnia persists. Re: clonazepam, is taking 0.25 BID daily PRN for anxiety, mostly using twice daily. Will continue with this for now amidst other medication changes per above, and if he's feeling in a more stable place in 2-3 weeks can start gradual benzo taper (0.25 BID --> 0.25 daily x2 weeks --> 0.25 every other day x2 weeks --> stop altogether). Pt is in agreement with plan and has no further questions or concerns.   - Continue clonazepam 0.25 BID with gradual taper per above per pt comfort  - Continue following with pain management    # Trochanteric bursitis  - Sports med referral for repeat injection    HEALTH MAINTENANCE ITEMS STILL DUE:  Health Maintenance Due   Topic Date Due    Pneumococcal Vaccine 0-64 (2 - PCV) 06/24/2021     Follow-up: No follow-ups on file.    Future Appointments   Date Time Provider Department Center   05/05/2022 10:45 AM Shanon Brow T, PT PTOT [facility]   05/10/2022  2:30 PM Lobonc, Ammie Ferrier, MD ANESPAINMR[facility]   05/17/2022  9:00 AM Mena Goes, PsyD [facility]   05/19/2022 12:30 PM Thora Lance, MD HVSURG [facility]   05/23/2022  9:00 AM Kern Reap, MD [facility]   05/25/2022  8:00 AM Nat Christen., MD ANESPAINMRKT [facility]   05/26/2022  8:40 AM Kern Reap, MD [facility]   05/27/2022  8:00 AM Royetta Crochet, MD [facility]   08/03/2022 10:20 AM Mahoro, Champ Mungo, MD UNCGIMEDET [facility]       Subjective   Vincent Waters is a 41 y.o. adult  coming to clinic today for the following issues:    Chief Complaint   Patient presents with    Follow-up     Med     HPI: see above for more information.     I have reviewed the problem list, medications, and allergies and have updated/reconciled them if needed.    Vincent Waters  reports that he has quit smoking. His smoking use included cigarettes. He has never used smokeless tobacco.  Health Maintenance   Topic Date  Due    Pneumococcal Vaccine 0-64 (2 - PCV) 06/24/2021    COVID-19 Vaccine (5 - Pfizer series) 05/23/2022    Lipid Screening  01/22/2025    DTaP/Tdap/Td Vaccines (2 - Td or Tdap) 08/19/2027    Hepatitis C Screen  Completed    Influenza Vaccine  Completed       Objective     VITALS: BP 123/88  - Pulse 85  - Temp 36.6 ??C (97.9 ??F) (Temporal)  - Ht 162.6 cm (5' 4)  - Wt 74.4 kg (164 lb)  - LMP 07/20/2017 (Exact Date)  - BMI 28.15 kg/m??     Physical Exam  General: well-appearing, sitting upright in no acute distress  Head: Normocephalic, atraumatic  ENT: No dental trauma noted.   Eyes: conjunctiva normal, non-erythematous, non-icteric, no discharge.  Neck: no thyroid enlargement or masses  Lungs: No increased work of breathing or audible wheezing  Skin: Warm, dry, no erythema or rash on exposed skin  Musculoskeletal: No visible gait abnormalities  Neurologic: Alert & oriented x 3, no gross sensorimotor abnormalities  Psychiatric: Pleasant, cooperative, good eye contact, appropriate thought processes    Wt Readings from Last 3 Encounters:   04/29/22 74.4 kg (164 lb)   04/28/22 74.1 kg (163 lb 4.8 oz)   04/27/22 73.9 kg (163 lb)      PHQ-9 PHQ-9 TOTAL SCORE   10/22/2020   9:00 AM 6   09/04/2020   9:00 AM 3   08/21/2020   9:00 AM 5   05/22/2020   3:00 PM 9     LABS/IMAGING  I have reviewed pertinent recent labs and imaging in Epic    Alver Fisher, MD, MPH (he/him)  Resident Physician, PGY-3  Department of Family Medicine        [facility]  [address] [place], [place] [address]  Telephone [phone]  Fax [phone]  [email or web address]

## 2022-05-02 ENCOUNTER — Ambulatory Visit: Admit: 2022-05-02 | Discharge: 2022-05-03 | Payer: PRIVATE HEALTH INSURANCE

## 2022-05-02 DIAGNOSIS — J3089 Other allergic rhinitis: Principal | ICD-10-CM

## 2022-05-02 DIAGNOSIS — K909 Intestinal malabsorption, unspecified: Principal | ICD-10-CM

## 2022-05-02 MED ORDER — AZELASTINE 137 MCG (0.1 %) NASAL SPRAY AEROSOL
Freq: Two times a day (BID) | NASAL | 0 refills | 25 days | Status: CP | PRN
Start: 2022-05-02 — End: 2023-05-02
  Filled 2022-05-09: qty 30, 25d supply, fill #0

## 2022-05-02 MED ORDER — EPINEPHRINE 0.3 MG/0.3 ML INJECTION, AUTO-INJECTOR
Freq: Once | SUBCUTANEOUS | 0 refills | 2 days | Status: CP
Start: 2022-05-02 — End: 2022-05-03
  Filled 2022-05-09: qty 2, 2d supply, fill #0

## 2022-05-02 NOTE — Unmapped (Signed)
I was the supervising attending present and available in clinic during this patient's visit.   I have reviewed the patient's medical record and agree with the assessment and plan as documented in the resident's note.     Shakoya Gilmore F Enio Hornback, MD

## 2022-05-03 LAB — DRUG SCREEN, PAIN CLINIC, URINE
2-HYDROXY ETHYL FLURAZEPAM, UR: NOT DETECTED ng/mL
6-MONOACETYLMORPHINE, UR: NOT DETECTED ng/mL
7-AMINOFLUNITRAZEPAM, UR: NOT DETECTED ng/mL
ALPHA-HYDROXY TRIAZOLAM, UR: NOT DETECTED ng/mL
ALPHA-HYDROXYALPRAZOLAM GLUCURONIDE, UR: NOT DETECTED ng/mL
ALPHA-HYDROXYALPRAZOLAM, UR: NOT DETECTED ng/mL
ALPRAZOLAM, UR: NOT DETECTED ng/mL
AMPHETAMINE, UR: NOT DETECTED ng/mL
BUPRENORPHINE, UR: NOT DETECTED ng/mL
CHLORDIAZEPOXIDE, UR: NOT DETECTED ng/mL
CLOBAZAM, UR: NOT DETECTED ng/mL
CLONAZEPAM, UR: NOT DETECTED ng/mL
COCAINE METABOLITES URINE: NEGATIVE ng/mL
CODEINE, UR: NOT DETECTED ng/mL
CODEINE-6-BETA-GLUCURONIDE, UR: NOT DETECTED ng/mL
COMMENT,URINE: NORMAL
CREATININE-O-ADULT: 111 mg/dL
DIAZEPAM, UR: NOT DETECTED ng/mL
DIHYDROCODEINE, UR: NOT DETECTED ng/mL
EDDP, UR: NOT DETECTED ng/mL
EPHEDRINE, UR: NOT DETECTED ng/mL
FLUNITRAZEPAM, UR: NOT DETECTED ng/mL
FLURAZEPAM, UR: NOT DETECTED ng/mL
HYDROCODONE, UR: NOT DETECTED ng/mL
HYDROMORPHONE, UR: NOT DETECTED ng/mL
HYDROMORPHONE-3-BETA-GLUCURONIDE, UR: NOT DETECTED ng/mL
LORAZEPAM GLUCURONIDE, UR: NOT DETECTED ng/mL
LORAZEPAM, UR: NOT DETECTED ng/mL
MDA, UR: NOT DETECTED ng/mL
MDEA, UR: NOT DETECTED ng/mL
MDMA, UR: NOT DETECTED ng/mL
MEPERIDINE, UR: NOT DETECTED ng/mL
METHADONE, UR: NOT DETECTED ng/mL
METHAMPHETAMINE, UR: NOT DETECTED ng/mL
METHYLPHENIDATE, UR: NOT DETECTED ng/mL
MIDAZOLAM, UR: NOT DETECTED ng/mL
MORPHINE, UR: NOT DETECTED ng/mL
MORPHINE-6-BETA-GLUCURONIDE, UR: NOT DETECTED ng/mL
N-DESMETHYLCLOBAZAM, UR: NOT DETECTED ng/mL
NALOXONE, UR: NOT DETECTED ng/mL
NORBUPRENOORPHINE GLUCURONIDE, UR: NOT DETECTED ng/mL
NORBUPRENORPHINE, UR: NOT DETECTED ng/mL
NORDIAZEPAM, UR: NOT DETECTED ng/mL
NORFENTANYL, UR: NOT DETECTED ng/mL
NORHYDROCODONE, UR: NOT DETECTED ng/mL
NORMEPERIDINE, UR: NOT DETECTED ng/mL
NOROXYMORPHONE, UR: NOT DETECTED ng/mL
OXAZEPAM GLUCURONIDE, UR: NOT DETECTED ng/mL
OXAZEPAM, UR: NOT DETECTED ng/mL
OXIDANTS-O-ADULT: NEGATIVE
OXYCODONE, UR: NOT DETECTED ng/mL
OXYMORPHONE, UR: NOT DETECTED ng/mL
OXYMORPHONE-3-BETA-GLUCURONIDE, UR: NOT DETECTED ng/mL
PH-O-ADULT: 5.7
PHENCYCLIDINE, UR: NOT DETECTED ng/mL
PHENTERMINE, UR: NOT DETECTED ng/mL
PRAZEPAM, UR: NOT DETECTED ng/mL
PROPOXYPHENE, UR: NOT DETECTED ng/mL
PSEUDOEPHEDRINE, UR: NOT DETECTED ng/mL
RITALINIC ACID, UR: NOT DETECTED ng/mL
SPECIFIC GRAVITY-O-ADULT: 1.013
TAPENTADOL, UR: NOT DETECTED ng/mL
TEMAZEPAM GLUCURONIDE, UR: NOT DETECTED ng/mL
TEMAZEPAM, UR: NOT DETECTED ng/mL
THC, SCREEN URINE: NEGATIVE ng/mL
TRAMADOL, UR: NOT DETECTED ng/mL
TRIAZOLAM, UR: NOT DETECTED ng/mL
URINE BARBITURATES MAYO: NEGATIVE ng/mL
ZOLPIDEM PHENYL-4-CARBOXYLIC ACID, UR: NOT DETECTED ng/mL
ZOLPIDEM, UR: NOT DETECTED ng/mL

## 2022-05-03 NOTE — Unmapped (Signed)
Addended by: Marylyn Ishihara on: 05/02/2022 04:22 PM     Modules accepted: Orders

## 2022-05-06 ENCOUNTER — Ambulatory Visit
Admit: 2022-05-06 | Discharge: 2022-06-04 | Payer: PRIVATE HEALTH INSURANCE | Attending: Rehabilitative and Restorative Service Providers" | Primary: Rehabilitative and Restorative Service Providers"

## 2022-05-06 ENCOUNTER — Ambulatory Visit
Admit: 2022-05-06 | Payer: PRIVATE HEALTH INSURANCE | Attending: Rehabilitative and Restorative Service Providers" | Primary: Rehabilitative and Restorative Service Providers"

## 2022-05-06 NOTE — Unmapped (Signed)
Pre-procedure call made and RN spoke with patient    Recent infection/antibiotics. Denied    Are you diabetic? No Monitor Blood Glucose Daily? No    Taking any recent blood thinners/NSAIDs. No    Any electronic implants No    Pregnancy NA    Driver necessary No    Patient informed to arrive 30 minutes before procedure appointment time.  Patient verbalized understanding to all.

## 2022-05-06 NOTE — Unmapped (Signed)
[facility] Administered Medication Refill Coordination Note      NAME:[facility] DOB: Jun 11, 1981      Medication: Xolair    Day Supply: 28 days      SHIPPING      Next delivery from [facility] [phone]) to  Allergy & Asthma  for [facility] is scheduled for 11/22.    Clinic contact: Teodoro Kil    Patient's next nurse visit for administration: 12/06.    We will follow up with clinic monthly for standard refill processing and delivery.      Nataliyah Packham Samella Parr  Specialty Pharmacy Technician

## 2022-05-06 NOTE — Unmapped (Signed)
[facility] [place]  OUTPATIENT PHYSICAL THERAPY  05/06/2022  Note Type: Evaluation       Patient Name: Vincent Waters  Date of Birth:[birth year]  Diagnosis:   Encounter Diagnoses   Name Primary?    Neck pain on right side Yes    Chronic right shoulder pain     Shoulder stiffness, right     Thoracic outlet syndrome      Referring MD:  Dawna Part, *     Date of Onset of Impairment-09/18/2016  Date PT Care Plan Established or Reviewed-05/06/2022  Date PT Treatment Started-05/06/2022   Plan of Care Effective Date:          Assessment/Plan:    Assessment  Assessment details:    41 y.o. adult who presents to physical therapy with signs and symptoms consistent with right neck and shoulder pain, which is limiting his ability to perform daily functional activities that involve the use of his right upper extremity such as reaching, lifting, pushing/pulling, dressing/undressing, lying on his right side and turning his head for driving.  Although Vincent Waters's symptoms were initial relieved with surgery, it appears that his muscles have tightened up and caused stiffness and pain since then.  Formation of scar tissue can't be ruled at this time.  We discussed our plan (open up muscles, stretch, manual therapy to calm things down, eventually postural/shoulder strengthening) and he agrees with this plan.  Written instructions given for scapular retractions, pec stretching and wall slides.  He committed to performing them daily as part of his home exercise program. All questions answered.  This patient requires skilled physical therapy services to address the outlined impairments in order to return to his desired level of function.           Impairments: decreased strength, hypertonicity, impaired ADLs, muscular restrictions, impaired flexibility, impaired sensation, joint restriction, pain, postural weakness and trigger points      Personal Factors/Comorbidities: 1-2    Specific Comorbidities: Anxiety, Asthma    Examination of Body Systems: musculoskeletal and neurological    Clinical Presentation: evolving    Clinical Decision Making: moderate    Prognosis: good prognosis    Positive Prognosis Rationale: age, behavior, motivated for treatment, insight, medication compliance, medical status/condition, severity of symptoms, strength and language.  Negative Prognosis Rationale: Pain Status and chronicity of condition.      Therapy Goals      Goals:      Short Term Goals:  In 6 weeks:  Goal #1: Patient will be independent with progressively monitored home exercise program.   Baseline/Current: Patient is unfamiliar with home exercise program.     Goal #2: Patient will demonstrate cervical ROM within normal limits with minimal symptoms in order to allow for functional activities such as safe driving and looking before crossing the street.   Baseline/Current: Patient demonstrates painful and limited cervical ROM (see ROM testing).     Goal #3: Patient will demonstrate right shoulder range of motion within normal limits with minimal symptoms for proper glenohumeral joint mechanics in order to allow for daily functional tasks.   Baseline/Current: Patient has painful and limited right shoulder range of motion (see ROM testing).     Goal #4: Patient will demonstrate improved posture without cuing.  Baseline/Current: Patient has slumped posture and rounded shoulders.    Long Term Goals:  In 12 weeks:  Goal #1: Patient will demonstrate right shoulder strength within normal limits with minimal symptoms in order to allow  for pain free lifting and reaching with right upper extremity.   Baseline/Current: Patient has painful and limited right shoulder strength (see manual muscle testing).     Goal #2: Patient will be able to reach over head with minimal symptoms in order to place/remove an object over shoulder height in a cabinet.  Baseline/Current: Patient has difficulty reaching over head at present.    Goal #3: Patient will return to recreational activities (working out in gym) without limit.  Baseline/Current: Patient unable to do any upper body exercises at present.    Goal #4: Patient's FOTO score will improve from 34 at initial evaluation to >/= 52 at discharge in order to demonstrate a significant improvement in self-reported outcome measures that directly result from his course of physical therapy.   Baseline/Current: FOTO = 34    Plan    Therapy options: will be seen for skilled physical therapy services    Planned therapy interventions: Body Mechanics Training, Dry Needling, Education - Patient, Home Exercise Program, Manual Therapy, Neuromuscular Re-education, Postural Training, Therapeutic Activities and Therapeutic Exercises      Frequency: 1x week    Duration in weeks: 12    Education provided to: patient.    Education provided: HEP, Body mechanics, Importance of Therapy, Treatment options and plan, Symptom management and Body awareness    Education results: verbalized good understanding, demonstrates understanding and needs further instruction.    Communication/Consultation: N/A.    Next visit plan:        Manual therapy to promote improved SterC, AcroC, GHJ motion and loosening muscles, stretching, AAROM, postural/shoulder strengthening    Total Session Time: 50    Treatment rendered today:      PT Evaluation: 30 minutes    Therapeutic Exercise: 20 minutes  - Patient Education: Examination Findings, Plan of Care, Home Exercise Program With Handout, Body Mechanics, and Posture  - Scapular Retraction  - Pec Stretching in Doorway (low)  - Wall Slides     Plan details: Physiotec ID: [id number]       Subjective:   History of Present Condition  Date of Surgery: 11/16/2021  Surgical Procedure: Other (Right anterior scalenectomy, middle scalenectomy, first rib resection and right Pectoralis minor release)  History of Present Condition/Chief Complaint:       Vincent Waters is a 41 y.o. right hand dominant adult who presents to outpatient physical therapy with right neck and shoulder pain that began in early 2018 when he was changing the oil in his car when it fell onto his sternum.  He went to the ER and got an x-ray which showed a sternal fracture.  Since then, he has had persistent issues and nerve pressure in his right neck/shoulder which led him to get a right anterior scalenectomy, middle scalenectomy, first rib resection and right Pectoralis minor release surgery performed on 11/16/2021 by Thora Lance, MD.  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    Date of Onset:  09/18/2016  Date is an approximation?: yes  Explanation:  Patient reports onset of symptoms in early 2018.     Subjective:     Mr. Kalan reports pain and difficulty with lifting, carrying, reaching, sleeping, turning his head for driving and dressing/undressing.  He reports relief with rest, heat, Ibuprofen, and Gabapentin.  Mr. Friloux denies any bowel/bladder/sexual dysfunction, nausea, nystagmus, dysphagia, dysarthria, dizziness, diplopia, diaphoresis or drop attacks at this time.  He reports consistent numbness in the lateral aspect  of his right shoulder and chest following the surgery.  He also just feels like things are extremely tight.  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      Pain  Current pain rating: 0  At best pain rating: 0  At worst pain rating: 8  Location: right lateral neck/right chest  Quality: pressure, sharp and tightness  Relieving factors: medications, rest and heat  Aggravating factors: lifting, performance of arm dominant activites, overhead activity, sleeping, pulling, pushing and reaching  Pain Related Behaviors: avoidance  Progression: no change  Red flags: disturbed sleep.    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  Multilevel cervical spondylosis with mild bilateral foraminal narrowing at C6-7.        Treatments  Previous treatment: medication, physical therapy and surgery      Patient Goals  Patient goals for therapy: decreased pain, improve overhead reaching, increased ROM, increased strength, improved sleep, other and return to recreational activites (Improve lifting)        Objective:     Static Posture Assessment   Shoulders-Rounded.  Thoracic Spine-Hyperkyphosis.    Neurological Testing   Sensation  Cervical/Thoracic  Left  Intact: light touch  Right  Diminished: light touch  Comments  Right light touch: right lateral shoulder and anterior chest numbness.   Reflexes  Left  Biceps (C5/C6): normal (2+)  Brachioradialis (C6): normal (2+)  Triceps (C7): normal (2+)  Right  Biceps (C5/C6): normal (2+)  Brachioradialis (C6): normal (2+)  Triceps (C7): normal (2+)    Palpation     Right Shoulder   Hypertonic in the cervical paraspinals, latissimus, pectoralis major, pectoralis minor, subscapularis, teres major and upper trapezius.   Tenderness of the pectoralis major and pectoralis minor.     Right Cervical/Thoracic   Hypertonic in the latissimus, paraspinals, pectoralis major, pectoralis minor, scalenes, suboccipitals and upper trapezius.   Tenderness of the pectoralis major, pectoralis minor, scalenes and sternocleidomastoid.     Range of Motion  Shoulder  Active ROM  Right Shoulder-Active ROM   Flexion: 142 (tight) degrees   Extension: 70 (tight) degrees   Abduction: 157 degrees   External rotation 90??: 77 degrees  Internal rotation 90??: 58 degrees   Passive ROM  Right Shoulder-Passive ROM   Flexion: 160 degrees   Abduction: 170 degrees with pain  Cervical/Thoracic  Active Range of Motion   Cervical  Flexion: 37 degrees   Extension: 40 degrees   Left lateral flexion: 20 degrees   Right lateral flexion: 30 degrees   Left rotation: 80 degrees   Right rotation: 70 degrees     Joint Mobility  Cervical  Cervical Joint Mobilty   Hypomobile at C2, C3, C4, C5, C6 and C7.  Thoracic  Thoracic Joint Mobility   Hypomobile at T1, T2, T3 and T4.   Shoulder  Right Shoulder Joint Mobility    Posterior capsule: hypomobile  AC joint: hypomobile  Burton joint: hypomobile  1st rib: hypomobile and with pain    Strength  Right Shoulder   Planes of Motion   Extension: 4+   Additional Strength Details  Patient has bilaterally symmetrical and normal upper myotomes with no obvious sign of any neurological weakness, but he does have generalized weakness in his right upper extremity, including grip weakness.    Tests   Cervical   Right   Positive ULTT 1 - Median and ULTT 4 - Ulnar.   Negative ULTT3.     General Comments   Cervical/Thoracic Spine Comments  All cervical and thoracic joints moving, but somewhat stiff.  Muscle tightness is likely contributing.                                  I attest that I have reviewed the above information.  Signed: Patton Salles, PT  05/06/2022 1:05 PM

## 2022-05-08 ENCOUNTER — Ambulatory Visit: Admit: 2022-05-08 | Discharge: 2022-05-09 | Payer: PRIVATE HEALTH INSURANCE

## 2022-05-08 NOTE — Unmapped (Signed)
I will follow up with you about your swab results and bloodwork.

## 2022-05-08 NOTE — Unmapped (Signed)
[facility] CLINIC NOTE    ASSESSMENT/PLAN:    Problem List Items Addressed This Visit    None  Visit Diagnoses       Acute vaginitis    -  Primary    Relevant Orders    Wet prep, genital (Completed)    Chlamydia/Gonorrhoeae NAA    Rapid Trichomonas (Completed)    HIV Antigen/Antibody Combo ([place])    Syphilis Screen    Screen for STD (sexually transmitted disease)        Relevant Orders    Chlamydia/Gonorrhoeae NAA          41 yo transgender male presents with one day of vaginal itching and yellow discharge after sexual encounter a few days prior. No pelvic pain, dysuria, rashes, lesions, fever, or other associated symptoms. Declines vaginal exam. Abdominal exam normal. STI screen and wet prep ordered. Intermediate BV, will offer treatment. Safe sex practices discussed and return precautions reviewed.     Chief Complaint   Patient presents with    vaginal area (itching)        SUBJECTIVE:    Vincent Waters is a 41 y.o. adult that presents to Urgent Care  today regarding the following issues:    # Vaginal itching:  - Sexual encounter a few days ago  - Yesterday had some discharge  - Told to come to urgent care, not wanting things to get out of control  - Consistent yellow discharge  - No dysuria, does not think he has a UTI  - No pelvic pain   - No rashes or lesions   - Sexually active with men and women, last sexual partner from the last time that he had this checked was a male   - Has vaginal, oral sex   - A few days ago felt cold-like symptoms that they resolved and the discharge popped up  - Declines vaginal exam       I have reviewed the patients problem list, medical history, surgical history, laboratory history and recent hospitalizations, current medications, allergies, and social history and updated them as needed.    Review of symptoms:  Negative unless  Otherwise stated in HPI.     Vincent Waters  reports that he has quit smoking. His smoking use included cigarettes. He has never used smokeless tobacco.    OBJECTIVE:    VITALS:   Vitals:    05/08/22 1300   BP: 126/80   Pulse: 93   Temp: 36.2 ??C (97.2 ??F)    Wt:   Wt Readings from Last 3 Encounters:   05/08/22 73.5 kg (162 lb)   04/29/22 74.4 kg (164 lb)   04/28/22 74.1 kg (163 lb 4.8 oz)       Gen: Pleasant and cooperative in NAD, resting comfortably, appears stated age  Head: Normocephalic, atraumatic  CV: Normal S1 S2 no m/r/g  Resp: Clear to ascultation bilaterally without adventitious sounds  GI: Soft, nontender,+BS, no palpable masses, no hepatosplenomegaly, no CVAT  Msk: No obvious deformities or swelling  Neuro:  A&O x 4, CN II-XII grossly intact, normal gait  Skin: Warm and dry, no obvious rash or suspicious lesions present  Psych:  Mood is good, able to carry on normal conversation, with good eye contact, without evidence of anxiety or depression    LABS:  Results for orders placed or performed in visit on 05/08/22   Rapid Trichomonas    Specimen: Vagina; Swab   Result Value  Ref Range    Trichomonas Scrn Negative Negative   Wet prep, genital    Specimen: Vagina; Swab   Result Value Ref Range    Bacterial Vaginitis Intermediate (A) Negative    Yeast Screen No Budding Yeast No Budding Yeast       STUDIES:  No results found.    ___________________________________  CURRENT MEDS:  Current Outpatient Medications   Medication Sig Dispense Refill    azelastine (ASTELIN) 137 mcg (0.1 %) nasal spray Instill 2 sprays into each nostril two (2) times a day as needed for rhinitis. 30 mL 0    baclofen (LIORESAL) 10 MG tablet Take 0.5-1 tablets (5-10 mg total) by mouth two (2) times a day as needed for muscle spasms. 60 tablet 1    budesonide-formoteroL (SYMBICORT) 160-4.5 mcg/actuation inhaler Inhale 1 puff by mouth Two (2) times a day. 10.2 g 5    cetirizine (ZYRTEC) 10 MG tablet Take 1 tablet (10 mg total) by mouth daily. 30 tablet 12    clindamycin (CLEOCIN T) 1 % lotion 1 application. as needed.      clonazePAM (KLONOPIN) 0.5 MG tablet Take half tablet (0.25 mg total) by mouth once daily as needed for anxiety. For the next two weeks, every other day, may take an additional half a tablet as needed. After two weeks, decrease to taking half a tablet once daily as needed, without the additional dose 30 tablet 0    dextroamphetamine-amphetamine (ADDERALL) 20 mg tablet Take 1 tablet (20 mg total) by mouth daily as needed. 10 tablet 0    emtricitabine-tenofovir alafen (DESCOVY) 200-25 mg tablet Take 1 tablet by mouth daily. 90 tablet 0    EPINEPHrine (EPIPEN) 0.3 mg/0.3 mL injection Inject 0.3 mL (0.3 mg total) under the skin once for 1 dose. 2 each 0    famotidine (PEPCID) 40 MG tablet Take 1 tablet (40 mg total) by mouth every evening. 30 tablet 1    gabapentin (NEURONTIN) 250 mg/5 mL oral solution Take 12 mL (600 mg total) by mouth Three (3) times a day. 1080 mL 3    ibuprofen (MOTRIN) 800 MG tablet Take 1 tablet (800 mg total) by mouth every eight (8) hours as needed for pain. 60 tablet 2    inhalational spacing device Spcr Use as directed with albuterol and symbicort 1 each 1    methylPREDNISolone (MEDROL DOSEPACK) 4 mg tablet follow package directions 21 each 0    needle, disp, 25 Uno (BD REGULAR BEVEL NEEDLES) 25 Artavious x 5/8 Ndle For subcutaneous hormone injection. 25 each 0    nifedipine 0.3% lidocaine 1.5% in petrolatum ointment Apply a pea-size amount to anal area 3 times a day 100 g 0    omalizumab (XOLAIR) 150 mg/mL syringe Inject 2 mL (300 mg total) under the skin every twenty-eight (28) days. 2 mL 12    pantoprazole (PROTONIX) 20 MG tablet Take 1 tablet (20 mg total) by mouth daily. 30 tablet 1    prucalopride 2 mg Tab Take 1 tablet (2 mg total) by mouth daily. 90 tablet 0    safety needles (BD SAFETYGLIDE NEEDLE) 18 Sameer x 1 1/2 Ndle For drawing hormone injection 25 each 0    syringe, disposable, (EASY TOUCH LUER LOCK SYRINGE) 1 mL Syrg Use for weekly hormone injection. 4 each 0    syringe, disposable, 2.5 mL Syrg Use weekly 30 Syringe 2    testosterone cypionate (DEPOTESTOTERONE CYPIONATE) 200 mg/mL injection Inject 0.2 mL (40 mg total) under the  skin every seven (7) days. 2 mL 0    tiZANidine (ZANAFLEX) 4 MG tablet Take 1 tablet (4 mg total) by mouth every six (6) hours as needed. 30 tablet 3    triamcinolone (KENALOG) 0.1 % cream Apply topically Two (2) times a day. 15 g 0     Current Facility-Administered Medications   Medication Dose Route Frequency Provider Last Rate Last Admin    omalizumab [name]) injection 300 mg  300 mg Subcutaneous Q28 Days [name], [name], [name]   300 mg at 04/27/22 1202       ___________________________________  ALLERGIES:  Allergies   Allergen Reactions    Latex Rash and Hives    Penicillins Anaphylaxis     Pt can tolerate amoxicillin   Has patient had a PCN reaction causing immediate rash, facial/tongue/throat swelling, SOB or lightheadedness with hypotension: no   Has patient had a PCN reaction causing severe rash involving mucus membranes or skin necrosis: no  Has patient had a PCN reaction that required hospitalization: unknown  Has patient had a PCN reaction occurring within the last 10 years: no  If all of the above answers are NO, then may proceed with Cephalosporin use.    Shellfish Containing Products Anaphylaxis    Other Rash     Nifedipine 0.3%, Lidocaine 1.5% in petrolatum ointment caused rash on his Bottom    Latex, Natural Rubber Rash    Penicillin Rash     ------------------------    PAST MEDICAL HISTORY:   Past Medical History:   Diagnosis Date    Anxiety     Asthma     Autoimmune autonomic neuropathy     Breast cyst     Financial difficulties     Lack of access to transportation     Migraines     Neuromuscular disorder (CMS-HCC)     Scleroderma (CMS-HCC)     Visual impairment          FMURGENTCARETRACKING       Did today's visit save an ED/Direct Admission?  no    Follow-up with PCP      I personally spent 26 minutes face-to-face and non-face-to-face in the care of this patient, which includes all pre, intra, and post visit time on the date of service.      [facility]  [place] [facility] [place] [facility]  [address] [place], [place] [address]  Telephone [phone]  Fax [phone]  [email or web address]

## 2022-05-09 DIAGNOSIS — F902 Attention-deficit hyperactivity disorder, combined type: Principal | ICD-10-CM

## 2022-05-09 DIAGNOSIS — N760 Acute vaginitis: Principal | ICD-10-CM

## 2022-05-09 DIAGNOSIS — B9689 Other specified bacterial agents as the cause of diseases classified elsewhere: Principal | ICD-10-CM

## 2022-05-09 DIAGNOSIS — F1320 Sedative, hypnotic or anxiolytic dependence, uncomplicated: Principal | ICD-10-CM

## 2022-05-09 LAB — SYPHILIS SCREEN: SYPHILIS RPR SCREEN: NONREACTIVE

## 2022-05-09 LAB — HIV ANTIGEN/ANTIBODY COMBO: HIV ANTIGEN/ANTIBODY COMBO: NONREACTIVE

## 2022-05-09 MED ORDER — METRONIDAZOLE 500 MG TABLET
ORAL_TABLET | Freq: Two times a day (BID) | ORAL | 0 refills | 7 days | Status: CP
Start: 2022-05-09 — End: 2022-05-16
  Filled 2022-05-09: qty 14, 7d supply, fill #0

## 2022-05-09 MED ORDER — CLONAZEPAM 0.5 MG TABLET
ORAL_TABLET | 0 refills | 0 days
Start: 2022-05-09 — End: ?

## 2022-05-09 MED ORDER — DEXTROAMPHETAMINE-AMPHETAMINE 20 MG TABLET
ORAL_TABLET | Freq: Every day | ORAL | 0 refills | 10 days | PRN
Start: 2022-05-09 — End: 2022-06-08

## 2022-05-09 MED FILL — DESCOVY 200 MG-25 MG TABLET: ORAL | 90 days supply | Qty: 90 | Fill #0

## 2022-05-09 MED FILL — SYMBICORT 160 MCG-4.5 MCG/ACTUATION HFA AEROSOL INHALER: RESPIRATORY_TRACT | 30 days supply | Qty: 10.2 | Fill #1

## 2022-05-09 NOTE — Unmapped (Addendum)
Trigger Point Injection of 3+ muscle groups: trapezius, rhomboids, cervical paraspinals, levator scapulae, teres minor  Pre-operative diagnosis: myofascial pain  Post-operative diagnosis: Same    Attending Physician: Dr. Criss Rosales  Assistant: Dr. Jerrell Mylar    Vincent Waters is a 41 y.o. 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 Gayla Doss in May 2023.  He was first seen by our clinic in March 2023 for diagnostic injections.  His range of motion has an increased postoperatively, but still has tight, spasming pain in the right neck and shoulder.  He is currently weaning off benzodiazepines, discontinued opioid medications which he was chronically on for 3 years, and inquires about optimizing medications and procedures to improve his baseline pain at this time.  His primary pain generator includes myofascial pain post-TOS decompression. Patient presents with worsened pain localized to right neck and shoulder area. He denies radiation of pain down his RUE. Patient presents for TPI injections today.    Serial consent obtained 05/10/22.    After risks and benefits were explained including bleeding, infection, worsening of the pain, damage to the area being injected, weakness, allergic reaction to medications, vascular injection, pneumothorax and nerve damage, signed consent was obtained.  All questions were answered.   The patient is not taking antiplatelet or anticoagulation medications and does  have a driver today.    The area of the trigger point was identified and the skin prepped with chloroprep.  Next, a 27 Jaedin 0.5 inch needle was placed in the area of the trigger point.  Dry needling was performed in the area of the trigger point.  Once reproduction of the pain was elicited and negative aspiration confirmed, the trigger point was injected with 0.5-1 ml of 0.25% bupivacaine and the needle removed.      The patient did tolerate the procedure well and there were not complications.      Trigger point locations: trapezius, rhomboids, cervical paraspinals, levator scapulae, teres minor    The patient was monitored for 15 minutes after the procedure.  Vital signs remained normal and the patient ambulated out of clinic    Pre-procedure pain score: 8/10  Post-procedure pain score: 5/10    DISPO:  Patient has a clinic follow up 12/6.  Trying to avoid opioids if we can through procedures and nonopioid medications.  Can repeat these in around a month.    Orders Placed This Encounter   Procedures    Trigger Point Injs 3+ Muscles ([address])     Lobonc  Quad proc or spine center  TPI visit  No driver needed  Has serial consent  OK to overbook or use a noon slot if needed.     Standing Status:   Future     Standing Expiration Date:   05/11/2023

## 2022-05-09 NOTE — Unmapped (Unsigned)
[facility]    Chief complaint: follow up of hives, asthma, allergic rhinitis. Last seen by me 03/2021    Assessment and Plan:   Vincent Waters) is a 41 y.o. transgender male with anxiety and migraine headaches who was seen for follow-up visit for allergies, asthma, and chronic urticaria.    Allergic rhinoconjunctivitis, sensitized to dust mite and mold:   Vincent Waters reports symptoms of ocular itchiness, post nasal drip. For his ocular symptoms, we recommend use of Pataday 1 drop per eye daily with the use of an eye lubricant. Also recommended Allegra as below. I also discussed restarting Flonase sensimist 1 squirts in each nostril BID. Went over proper technique. Also recommended Astelin 1 spray twice a day for additional control. He will also do daily nasal rinses prior to nasal sprays. Hopeful that Xolair, as below, will also help with allergic rhinitis.    Chronic urticaria, poorly controlled:   Vincent Waters's clinical history is consistent with chronic urticaria. Chronic idiopathic urticaria (or chronic spontaneous urticaria) is characterized by recurrent, fleeting episodes of urticaria, with or without angioedema, on most days of the week. In the majority of cases, no external cause can be identified. Last visit labs were unremarkable except for a low TSH. He will follow this up with his PCP and endocrinologist . His hives were previously well controlled with Xolair. We will work on restarting Xolair 300mg  q28 days as he is intolerant to high dose Zyrtec secondary to side effects of dizziness and drowsiness. In the meantime, he will start Allegra 2 pills in the morning, 2 pills in the evening.     Moderate-persistent asthma, uncontrolled:   Vincent Waters's asthma is not well controlled. Will step up Symbicort 160-4.5 mcg to 2 puffs BID with chamber. I ordered PFTs to assess his lung function. I am hopeful that Xolair will help with his asthma as it has in the past.    Folliculitis on back  Will prescribe a short course of clinamycin 1%. He will continue f/u with PCP for management    Profuse sweating  Likely related to testosterone therapy. He will follow up with PCP or endocrinologist regarding potential treatment     No follow-ups on file.    The patient was discussed with Dr. Danton Waters who agrees with the assessment and plan.  --  Vincent Abts, MD  Allergy/Immunology Fellow  [facility]     Subjective:   Last clinic visit: 05/2020 with Dr. Alfredo Waters    To review his history: Vincent Waters reported itchy and red eyes that occur throughout the day. He reported that his symptoms occur throughout the year. In the past, he tried Primary school teacher but it caused his eyes to burn. His last allergy testing was in 06/2018 and was positive to dust mite and molds. He has tried Flonase but stopped using it due to nasal burning. He has not tried [employer] or Astelin. He reported history of recurrent hives on his arms and buttocks. He reports worsening symptoms with heat. He denies swelling and denied NSAID use. He has tried Careers adviser and Zyrtec in the past. He was previously on Xolair and tolerated this well. He has been off Xolair since around 2019. He reports having hives daily. He denies weight loss or fevers.  In January, he stopped taking Symbicort. He reports having difficulty getting in contact with our clinic. He does report that his asthma was better controlled when he was taking Symbicort. He reports using his rescue inhaler  once daily for chest tightness.     Interval history:  -since being on testosterone for 5 years, has been having chronic hives. Also has folliculitis and xerosis. Also sweats all the time, even during the cold weather and this worsens hives. He use to be on Xolair every 2 weeks for 2 years in [place], but moved so had to stop during the transition. Last on it in 2019 and it controlled hives; ever since stopping hives came back. Hives are worse during the cold morning, warm weather in the evening and with showers. He is taking Cetirizine 10mg  twice a day. It helps, but still getting hives. He tried taking 20 mg BID but it caused him to be groggy. Has not tried Careers adviser. He use to be on a lot of prednisone for hives. He gets hives buttocks, arms, chest, groin.     -folliculitis was controlled on Clindamycin, but has run out. Insurance did not cover benzyl peroxide. Wants a prescription. Also tells me he wants a medication for profuse sweating.    -for his asthma, he is on 1 puffs BID Symbicort with a chamber. He had 8-9 episodes of flares with chest tighting and wheezing past 3 months. Responds immediately to Albuterol. Last happened 2 weeks ago. Unclear triggers, sometimes when hives flare. Not with activity. He does not feel limited by his asthma. No steroids or ED visit. No night time symptoms. Tried Singulair in the past, but it did not work. He also felt like his asthma was under better control and his allergies on Xolair.     -allergic rhinitis: positive to mold, dust mite in 2020, IgE 420. Had surgery with ENT 12/2020 (previous surgery 2019). The patient is now s/p septoplasty and inferior turbinate resection on 01/04/2021. Has helped with his breathing, but feels like he gets worsening of post nasal drip. Symptoms all year round. Post nasal drip triggers cough. He is not on nasal sprays anymore after surgery, unsure why he stopped them. Also getting dry eyes that burn. Pataday did not help.    ROS: Pertinent positive and negatives included in the above HPI. All other systems reviewed are negative.    Past Medical/Surgical/Allergy/Immunization/Social/Family History:  Reviewed and updated in Epic since last visit on 09/2018.     Current Outpatient Medications   Medication Sig Dispense Refill    azelastine (ASTELIN) 137 mcg (0.1 %) nasal spray Instill 2 sprays into each nostril two (2) times a day as needed for rhinitis. 30 mL 0    baclofen (LIORESAL) 10 MG tablet Take 0.5-1 tablets (5-10 mg total) by mouth two (2) times a day as needed for muscle spasms. 60 tablet 1    budesonide-formoteroL (SYMBICORT) 160-4.5 mcg/actuation inhaler Inhale 1 puff by mouth Two (2) times a day. 10.2 g 5    cetirizine (ZYRTEC) 10 MG tablet Take 1 tablet (10 mg total) by mouth daily. 30 tablet 12    clindamycin (CLEOCIN T) 1 % lotion 1 application. as needed.      clonazePAM (KLONOPIN) 0.5 MG tablet Take half tablet (0.25 mg total) by mouth once daily as needed for anxiety. For the next two weeks, every other day, may take an additional half a tablet as needed. After two weeks, decrease to taking half a tablet once daily as needed, without the additional dose 30 tablet 0    dextroamphetamine-amphetamine (ADDERALL) 20 mg tablet Take 1 tablet (20 mg total) by mouth daily as needed. 10 tablet 0    emtricitabine-tenofovir alafen (DESCOVY)  200-25 mg tablet Take 1 tablet by mouth daily. 90 tablet 0    EPINEPHrine (EPIPEN) 0.3 mg/0.3 mL injection Inject 0.3 mL (0.3 mg total) under the skin once for 1 dose. 2 each 0    famotidine (PEPCID) 40 MG tablet Take 1 tablet (40 mg total) by mouth every evening. 30 tablet 1    gabapentin (NEURONTIN) 250 mg/5 mL oral solution Take 12 mL (600 mg total) by mouth Three (3) times a day. 1080 mL 3    ibuprofen (MOTRIN) 800 MG tablet Take 1 tablet (800 mg total) by mouth every eight (8) hours as needed for pain. 60 tablet 2    inhalational spacing device Spcr Use as directed with albuterol and symbicort 1 each 1    methylPREDNISolone (MEDROL DOSEPACK) 4 mg tablet follow package directions 21 each 0    needle, disp, 25 Jermany (BD REGULAR BEVEL NEEDLES) 25 Tevion x 5/8 Ndle For subcutaneous hormone injection. 25 each 0    nifedipine 0.3% lidocaine 1.5% in petrolatum ointment Apply a pea-size amount to anal area 3 times a day 100 g 0    omalizumab (XOLAIR) 150 mg/mL syringe Inject 2 mL (300 mg total) under the skin every twenty-eight (28) days. 2 mL 12    pantoprazole (PROTONIX) 20 MG tablet Take 1 tablet (20 mg total) by mouth daily. 30 tablet 1    prucalopride 2 mg Tab Take 1 tablet (2 mg total) by mouth daily. 90 tablet 0    safety needles (BD SAFETYGLIDE NEEDLE) 18 Merland x 1 1/2 Ndle For drawing hormone injection 25 each 0    syringe, disposable, (EASY TOUCH LUER LOCK SYRINGE) 1 mL Syrg Use for weekly hormone injection. 4 each 0    syringe, disposable, 2.5 mL Syrg Use weekly 30 Syringe 2    testosterone cypionate (DEPOTESTOTERONE CYPIONATE) 200 mg/mL injection Inject 0.2 mL (40 mg total) under the skin every seven (7) days. 2 mL 0    tiZANidine (ZANAFLEX) 4 MG tablet Take 1 tablet (4 mg total) by mouth every six (6) hours as needed. 30 tablet 3    triamcinolone (KENALOG) 0.1 % cream Apply topically Two (2) times a day. 15 g 0     Current Facility-Administered Medications   Medication Dose Route Frequency Provider Last Rate Last Admin    omalizumab [name]) injection 300 mg  300 mg Subcutaneous Q28 Days [name], [name], [name]   300 mg at 04/27/22 1202       Josafat lives with his girlfriend in [place]. He works at [employer] but is currently on medical leave.    Objective:     Laboratory testing reviewed and pertinent for the following:  Component      Latest Ref Rng & Units 07/16/2018 08/15/2018   Cocklebur IgE      <0.35 kUA/L <0.35    Cat dander IgE      <0.35 kUA/L <0.35    C[place] (White [place]oplar) Tree IgE      <0.35 kUA/L <0.35    Dog Dander IgE      <0.35 kUA/L <0.35    D. farinae IgE      <0.35 kUA/L 0.66 (H)    D. pteronyssinus IgE      <0.35 kUA/L 0.55 (H)    Bahia Grass IgE      <0.35 kUA/L <0.35    Johnson Grass IgE      <0.35 kUA/L <0.35    Timothy Grass IgE      <0.35  kUA/L <0.35    Alternaria alternata IgE      <0.35 kUA/L 0.39 (H)    Candida Albicans IgE      <0.35 kUA/L 4.49 (H)    Cladosporium Herbarum IgE      <0.35 kUA/L <0.35    Epicoccum purpurascens IgE      <0.35 kUA/L <0.35    Fusarium Proliferatum IgE      <0.35 kUA/L <0.35    Aspergillus fumigatus IgE      <0.35 kUA/L <0.35    Mucor Racemosus IgE      <0.35 kUA/L <0.35    Aspergillus [place]bourg IgE      <0.35 kUA/L <0.35    Mouse IgE      <0.35 kUA/L <0.35    P chrysogenum (P notatum) IgE      <0.35 kUA/L <0.35    Rhizopus Nigrans      <0.35 kUA/L <0.35    Trichophyton rubrum IgE      <0.35 kUA/L <0.35    Setomelanomma rostrata (H. halodes) IgE      <0.35 kUA/L <0.35    Mugwort IgE      <0.35 kUA/L <0.35    Pigweed, Common IgE      <0.35 kUA/L <0.35    English Plantain IgE      <0.35 kUA/L <0.35    Giant Ragweed IgE      <0.35 kUA/L <0.35    German Cockroach IgE      <0.35 kUA/L <0.35    Sheep Sorrel      <0.35 kUA/L <0.35    Ragweed, short (common) IgE      <0.35 kUA/L <0.35    White Ash Tree IgE      <0.35 kUA/L <0.35    Beech (American) tree IgE      <0.35 kUA/L <0.35    Birch (Common Silver) tree IgE      <0.35 kUA/L <0.35    Box Elder Tree IgE      <0.35 kUA/L <0.35    Elm Tree IgE      <0.35 kUA/L <0.35    Maple Leaf Sycamore      <0.35 kUA/L <0.35    White Oak Tree IgE      <0.35 kUA/L <0.35    Pecan Hickory IgE      <0.35 kUA/L <0.35    Walnut Tree IgE      <0.35 kUA/L <0.35    [place] Grass IgE      <0.35 kUA/L <0.35    Goosefoot (Lamb's Quarters) IgE      <0.35 kUA/L <0.35    Willow tree IgE      <0.35 kUA/L <0.35    WBC      4.5 - 11.0 10*9/L 5.1 7.7   RBC      4.00 - 5.20 10*12/L 5.74 (H) 5.98 (H)   HGB      12.0 - 16.0 g/dL 16.1 09.6   HCT      04.5 - 46.0 % 47.1 (H) 48.9 (H)   MCV      80.0 - 100.0 fL 82.1 81.8   MCH      26.0 - 34.0 pg 25.8 (L) 25.8 (L)   MCHC      31.0 - 37.0 g/dL 40.9 81.1   RDW      91.4 - 15.0 % 13.9 14.0   MPV      7.0 - 10.0 fL 6.4 (L) 7.9   Platelet  150 - 440 10*9/L 345 383   Neutrophils %      % 69.2 74.5   Lymphocytes %      % 22.3 18.6   Monocytes %      % 4.8 4.6   Eosinophils %      % 1.3 0.2   Basophils %      % 0.7 0.3   Absolute Neutrophils      2.0 - 7.5 10*9/L 3.6 5.7   Absolute Lymphocytes      1.5 - 5.0 10*9/L 1.1 (L) 1.4 (L)   Absolute Monocytes       0.2 - 0.8 10*9/L 0.2 0.4   Absolute Eosinophils      0.0 - 0.4 10*9/L 0.1 0.0   Absolute Basophils       0.0 - 0.1 10*9/L 0.0 0.0   Large Unstained Cells      0 - 4 % 2 2   Hypochromasia      Not Present  Slight (A)   CD3% (T Cells)      61 - 86 %OfLymphs 74    Absolute CD3 Count      915-3,400 Cells/uL 889 (L)    CD4% (T Helper)      34 - 58 %OfLymphs 44    Absolute CD4 Count      510-2,320 Cells/uL 529    CD8% T Suppressor      12 - 38 %OfLymphs 27    Absolute CD8 Count      180-1,520 Cells/uL 325    CD4:CD8 Ratio      0.9 - 4.8 1.6    CD19% (B Cells)      7 - 23 %OfLymphs 11    Absolute CD19 Count      105 - 920 Cells/uL 132    CD16/56% NK Cell      1 - 27 %OfLymphs 13    Absolute CD16/56      15-1,080 Cells/uL 156    Diphtheria IgG Av Value      IU/mL 0.95    Tetanus IgG Value      IU/mL >2.24    Diphtheria IgG Ab       Positive    Tetanus IgG Ab       Positive    Sed Rate      0 - 20 mm/h 4 1   IgM      35 - 290 mg/dL 161    Total IgG      096-0,454 mg/dL 0,981    IgE, Total      2-214 IU/mL IU/mL 420 (H)    IgA      40.0 - 400.0 mg/dL 191.4    CRP      <78.2 mg/L  <5.0

## 2022-05-10 ENCOUNTER — Ambulatory Visit
Admit: 2022-05-10 | Discharge: 2022-05-11 | Payer: PRIVATE HEALTH INSURANCE | Attending: Anesthesiology | Primary: Anesthesiology

## 2022-05-10 DIAGNOSIS — G540 Brachial plexus disorders: Principal | ICD-10-CM

## 2022-05-10 DIAGNOSIS — J309 Allergic rhinitis, unspecified: Principal | ICD-10-CM

## 2022-05-10 DIAGNOSIS — L508 Other urticaria: Principal | ICD-10-CM

## 2022-05-10 DIAGNOSIS — J3089 Other allergic rhinitis: Principal | ICD-10-CM

## 2022-05-10 DIAGNOSIS — M7918 Myalgia, other site: Principal | ICD-10-CM

## 2022-05-10 MED ORDER — DEXTROAMPHETAMINE-AMPHETAMINE 20 MG TABLET
ORAL_TABLET | Freq: Every day | ORAL | 0 refills | 10 days | Status: CP | PRN
Start: 2022-05-10 — End: 2022-06-09
  Filled 2022-05-18: qty 10, 10d supply, fill #0

## 2022-05-10 MED ORDER — CLONAZEPAM 0.5 MG TABLET
ORAL_TABLET | 0 refills | 0 days | Status: CP
Start: 2022-05-10 — End: ?
  Filled 2022-05-18: qty 30, 30d supply, fill #0

## 2022-05-10 MED ADMIN — bupivacaine HCl (MARCAINE) 0.25 % (2.5 mg/mL) injection 30 mg: 12 mL | @ 19:00:00 | Stop: 2022-05-10

## 2022-05-10 NOTE — Unmapped (Signed)
AVS reviewed with patient and he verbalized understanding to all.

## 2022-05-10 NOTE — Unmapped (Signed)
POST PROCEDURE INSTRUCTIONS   You may apply an ice pack 20-30 minutes at a time to the injection site if you experience soreness.     Keep the injection site clean and dry. You make remove the band-aid one day following the procedure.     You may take a shower but AVOID getting in to baths, pools or whirlpools for 48 HOURS AFTER THE PROCEDURE.       ACTIVITY   Refrain from heavy activity for the next 24 to 48 hours. General walking is okay. You may resume your normal activities the day following the procedure.   You may start or resume your individualized exercise program or physical therapy 48 hours after the procedure.   MEDICATIONS   Please note that it is okay to continue other prescribed medications (blood pressure, insulin, water pill, depression/anxiety pill, etc.) as well as other prescribed pain medications such as Neurontin, Lyrical, Celebrex, Ultram, Vicodin, Norco and acetaminophen (Tylenol).   SIDE EFFECTS   Increase in pain during the first 24 to 48 hours.     You might experience:   1. Mild to moderate swelling at the joint.   2. Possible bruising at the injection site.     WHEN TO CALL THE DOCTOR/NURSE   Severe pain, worse or different that the pain you had before the procedure.     Fever or chills.     Redness, or swelling around the injection site.     Call the Pain Management  Procedural nurses [phone],  during normal business hours (7 am-3 pm). If it is AFTER HOURS or during a weekend or holiday, call the hospital operator and ask for the Anesthesia Pain physician on call at [phone].   FOR EMERGENCIES, CALL 911 OR GO TO THE NEAREST HOSPITAL EMERGENCY DEPARTMENT.   ?   TO SCHEDULE APPOINTMENTS OR FOR QUESTIONS RELATED TO MEDICATIONS   Call the Pain Management Clinic at [phone]

## 2022-05-11 ENCOUNTER — Institutional Professional Consult (permissible substitution): Admit: 2022-05-11 | Discharge: 2022-05-12 | Payer: PRIVATE HEALTH INSURANCE

## 2022-05-11 DIAGNOSIS — A549 Gonococcal infection, unspecified: Principal | ICD-10-CM

## 2022-05-11 MED ADMIN — cefTRIAXone (ROCEPHIN) intramuscular injection 500 mg: 500 mg | INTRAMUSCULAR | @ 19:00:00 | Stop: 2022-05-11

## 2022-05-11 NOTE — Unmapped (Signed)
Called and discussed results of vaginal and throat swabs showing +gonorrhea. Will treat with ceftriaxone 500 mg IM x1. Will arrange nurse visit for medication administration. Discussed reporting to state health department and also discussed recommendation for partner treatment and avoiding sexual activity until negative TOC in 14 days.     May 11, 2022 11:18 AM  Turkey L. Gadiel John MD MPH  [facility]  Clinic: [phone]  Personal pager: [phone]

## 2022-05-16 ENCOUNTER — Ambulatory Visit
Admit: 2022-05-16 | Discharge: 2022-05-17 | Payer: PRIVATE HEALTH INSURANCE | Attending: Student in an Organized Health Care Education/Training Program | Primary: Student in an Organized Health Care Education/Training Program

## 2022-05-16 DIAGNOSIS — J4540 Moderate persistent asthma, uncomplicated: Principal | ICD-10-CM

## 2022-05-16 DIAGNOSIS — J3089 Other allergic rhinitis: Principal | ICD-10-CM

## 2022-05-16 MED ORDER — ALBUTEROL SULFATE 2.5 MG/3 ML (0.083 %) SOLUTION FOR NEBULIZATION
Freq: Four times a day (QID) | RESPIRATORY_TRACT | 1 refills | 8 days | Status: CP | PRN
Start: 2022-05-16 — End: 2023-05-16
  Filled 2022-05-18: qty 90, 8d supply, fill #0

## 2022-05-16 MED ORDER — AZELASTINE 137 MCG (0.1 %) NASAL SPRAY AEROSOL
Freq: Two times a day (BID) | NASAL | 1 refills | 25 days | Status: CP | PRN
Start: 2022-05-16 — End: 2024-06-04

## 2022-05-16 MED ORDER — NEBULIZER AND COMPRESSOR
Freq: Every evening | 0 refills | 0 days | Status: CP | PRN
Start: 2022-05-16 — End: ?

## 2022-05-16 NOTE — Unmapped (Addendum)
Allergies  - Eye symptoms: Pataday 1 drop per eye daily   - Flonase sensimist 1 squirts in each nostril BID. Went over proper technique.   - Astelin 1-2 spray twice a day for additional control  - Daily nasal rinses prior to nasal sprays  - Xolair as below   - Continue cetirizine 10mg      Chronic urticaria, well controlled on Xolair:    Continue Xolair 300mg  every 4 weeks     Moderate-persistent asthma, uncontrolled:   Will continue Symbicort 160-4.5 mcg to 2 puffs twice daily with chamber. Xolair has been helpful with this as well.   Use Symbicort 2 puffs pre-exercise. May also use albuterol nebulizer prn for the night-time episodes triggered by regurgitation.  Follow-up with GI doctor

## 2022-05-16 NOTE — Unmapped (Unsigned)
[facility]    Chief complaint: follow up of hives, asthma, allergic rhinitis    Assessment and Plan:     [name]) is a 41 y.o. transgender male with anxiety and migraine headaches who was seen for follow-up visit for allergies, asthma, and chronic urticaria.    Allergic rhinoconjunctivitis, sensitized to dust mite and mold:   His symptoms are reasonably controlled with some ongoing post nasal drip. I recommend:   - Eye symptoms: Pataday 1 drop per eye daily   - Flonase sensimist 1 squirts in each nostril BID. Went over proper technique.   - Astelin 1-2 spray twice a day for additional control  - Daily nasal rinses prior to nasal sprays  - Xolair as below   - Continue cetirizine 10mg      Chronic urticaria, well controlled on Xolair:    CSU is characterized by recurrent, fleeting episodes of urticaria, with or without angioedema, on most days of the week. In the majority of cases, no external cause can be identified.   He is well controlled on Xolair. Continue Xolair 300mg  q28 days   Given he has symptoms when missing a dose for 2 weeks, we will continue at current frequency.     Moderate-persistent asthma, uncontrolled:   He is not controlled due to the episodes that are triggered by regurgitation at night - advised him to reach out to GI to further discuss this as it is worsening his breathing as well. Reflux may be causing lung inflammation. Will continue Symbicort 160-4.5 mcg 2 puffs BID with chamber; can also be used PRN as SMART therapy up to 12 puffs a day. Xolair has been helpful for his asthma as well. Asked him to use Symbicort 2 puffs pre-exercise. May also use albuterol nebulizer prn for the night-time episodes triggered by regurgitation. Prescribed albuterol nebulizer.     Return in about 4 months (around 09/14/2022).    Total time spent on today's visit was over 40 minutes, including both face-to-face time and non face-to-face time.     The patient was discussed with Dr. [name] who agrees with the assessment and plan.    Sincerely,    Tally Due, MD, MBA   Allergy & Immunology Fellow   ?     Subjective:     [facility] [name]) is a 41 y.o. transgender male with anxiety and migraine headaches who was seen for follow-up visit for allergies, asthma, and chronic urticaria.    Interval history:     Rhinitis:   He reports improvement in nasal drainage but continues to have itchy throat. He is using Flonase nasal spray, astelin prn, and cetirizine.   Has not done nasal rinses recently.     Urticaria:  - He has been back on Xolair for about a year. There was one time the went 2 weeks without it, rhinitis, urticaria, and asthma all worsened off of it.     Asthma:  He reports that he has gastroparesis and symptoms of regurgitation that occur from sleep every other night. Sometimes when he has that regurgitation, it flares up his lungs/asthma. Then he has to use the rescue inhaler or his roommate's albuterol nebulizer (which does help). He finds the nebulizer more helpful than the inhaler. This happens every other night. He is on a PPI and is following up with GI for the regurgitation.     He is using Symbicort 2 puffs twice a day every day with a  spacer. He uses an additional 2 puffs for rescue. No urgent care or ER visits for breathing. No oral steroids in the past year. He notes that he sometimes has chest tightness/heaviness when he is exerting himself. When doing new/more strenuous than usual activities.     ROS: Pertinent positive and negatives included in the above HPI. All other systems reviewed are negative.    Past Medical/Surgical/Allergy/Immunization/Social/Family History:  Reviewed and updated in Epic since last visit on 09/2018.     Current Outpatient Medications   Medication Sig Dispense Refill    albuterol 2.5 mg /3 mL (0.083 %) nebulizer solution Inhale 3 mL (2.5 mg total) by nebulization every six (6) hours as needed for wheezing or shortness of breath. 90 mL 1    azelastine (ASTELIN) 137 mcg (0.1 %) nasal spray Instill 2 sprays into each nostril two (2) times a day as needed for rhinitis. 30 mL 1    baclofen (LIORESAL) 10 MG tablet Take 0.5-1 tablets (5-10 mg total) by mouth two (2) times a day as needed for muscle spasms. 60 tablet 1    budesonide-formoteroL (SYMBICORT) 160-4.5 mcg/actuation inhaler Inhale 1 puff by mouth Two (2) times a day. 10.2 g 5    cetirizine (ZYRTEC) 10 MG tablet Take 1 tablet (10 mg total) by mouth daily. 30 tablet 12    clindamycin (CLEOCIN T) 1 % lotion 1 Application as needed.      clonazePAM (KLONOPIN) 0.5 MG tablet Take half tablet (0.25 mg total) by mouth once daily as needed for anxiety. For the next two weeks, every other day, may take an additional half a tablet as needed. After two weeks, decrease to taking half a tablet once daily as needed, without the additional dose 30 tablet 0    dextroamphetamine-amphetamine (ADDERALL) 20 mg tablet Take 1 tablet (20 mg total) by mouth daily as needed. 10 tablet 0    emtricitabine-tenofovir alafen (DESCOVY) 200-25 mg tablet Take 1 tablet by mouth daily. 90 tablet 0    EPINEPHrine (EPIPEN) 0.3 mg/0.3 mL injection Inject 0.3 mL (0.3 mg total) under the skin once for 1 dose. 2 each 0    famotidine (PEPCID) 40 MG tablet Take 1 tablet (40 mg total) by mouth every evening. 30 tablet 1    gabapentin (NEURONTIN) 250 mg/5 mL oral solution Take 12 mL (600 mg total) by mouth Three (3) times a day. 1080 mL 3    ibuprofen (MOTRIN) 800 MG tablet Take 1 tablet (800 mg total) by mouth every eight (8) hours as needed for pain. 60 tablet 2    inhalational spacing device Spcr Use as directed with albuterol and symbicort 1 each 1    nebulizer and compressor (COMP-AIR NEBULIZER COMPRESSOR) Devi 1 each by Miscellaneous route nightly as needed. 1 each 0    needle, disp, 25 Ebenezer (BD REGULAR BEVEL NEEDLES) 25 Gavriel x 5/8 Ndle For subcutaneous hormone injection. 25 each 0    nifedipine 0.3% lidocaine 1.5% in petrolatum ointment Apply a pea-size amount to anal area 3 times a day 100 g 0    omalizumab (XOLAIR) 150 mg/mL syringe Inject 2 mL (300 mg total) under the skin every twenty-eight (28) days. 2 mL 12    pantoprazole (PROTONIX) 20 MG tablet Take 1 tablet (20 mg total) by mouth daily. 30 tablet 1    prucalopride 2 mg Tab Take 1 tablet (2 mg total) by mouth daily. 90 tablet 0    safety needles (BD SAFETYGLIDE NEEDLE) 18 Latrelle x  1 1/2 Ndle For drawing hormone injection 25 each 0    syringe, disposable, (EASY TOUCH LUER LOCK SYRINGE) 1 mL Syrg Use for weekly hormone injection. 4 each 0    syringe, disposable, 2.5 mL Syrg Use weekly 30 Syringe 2    testosterone cypionate (DEPOTESTOTERONE CYPIONATE) 200 mg/mL injection Inject 0.2 mL (40 mg total) under the skin every seven (7) days. 2 mL 0    tiZANidine (ZANAFLEX) 4 MG tablet Take 1 tablet (4 mg total) by mouth every six (6) hours as needed. 30 tablet 3    triamcinolone (KENALOG) 0.1 % cream Apply topically Two (2) times a day. 15 g 0     Current Facility-Administered Medications   Medication Dose Route Frequency Provider Last Rate Last Admin    omalizumab [name]) injection 300 mg  300 mg Subcutaneous Q28 Days [name], [name], [name]   300 mg at 04/27/22 1202       Vincent Waters lives with his girlfriend in [place]. He works at [employer] but is currently on medical leave.    Objective:     Laboratory testing reviewed and pertinent for the following:  Component      Latest Ref Rng & Units 07/16/2018 08/15/2018   Cocklebur IgE      <0.35 kUA/L <0.35    Cat dander IgE      <0.35 kUA/L <0.35    [place] (White [place]oplar) Tree IgE      <0.35 kUA/L <0.35    Dog Dander IgE      <0.35 kUA/L <0.35    D. farinae IgE      <0.35 kUA/L 0.66 (H)    D. pteronyssinus IgE      <0.35 kUA/L 0.55 (H)    Bahia Grass IgE      <0.35 kUA/L <0.35    Johnson Grass IgE      <0.35 kUA/L <0.35    Timothy Grass IgE      <0.35 kUA/L <0.35    Alternaria alternata IgE      <0.35 kUA/L 0.39 (H) Candida Albicans IgE      <0.35 kUA/L 4.49 (H)    Cladosporium Herbarum IgE      <0.35 kUA/L <0.35    Epicoccum purpurascens IgE      <0.35 kUA/L <0.35    Fusarium Proliferatum IgE      <0.35 kUA/L <0.35    Aspergillus fumigatus IgE      <0.35 kUA/L <0.35    Mucor Racemosus IgE      <0.35 kUA/L <0.35    Aspergillus [place] IgE      <0.35 kUA/L <0.35    Mouse IgE      <0.35 kUA/L <0.35    P chrysogenum (P notatum) IgE      <0.35 kUA/L <0.35    Rhizopus Nigrans      <0.35 kUA/L <0.35    Trichophyton rubrum IgE      <0.35 kUA/L <0.35    Setomelanomma rostrata (H. halodes) IgE      <0.35 kUA/L <0.35    Mugwort IgE      <0.35 kUA/L <0.35    Pigweed, Common IgE      <0.35 kUA/L <0.35    English Plantain IgE      <0.35 kUA/L <0.35    Giant Ragweed IgE      <0.35 kUA/L <0.35    [place] Cockroach IgE      <0.35 kUA/L <0.35    Sheep Sorrel      <  0.35 kUA/L <0.35    Ragweed, short (common) IgE      <0.35 kUA/L <0.35    White Ash Tree IgE      <0.35 kUA/L <0.35    Beech (American) tree IgE      <0.35 kUA/L <0.35    Birch (Common Silver) tree IgE      <0.35 kUA/L <0.35    Box Elder Tree IgE      <0.35 kUA/L <0.35    Elm Tree IgE      <0.35 kUA/L <0.35    Maple Leaf Sycamore      <0.35 kUA/L <0.35    White Oak Tree IgE      <0.35 kUA/L <0.35    Pecan Hickory IgE      <0.35 kUA/L <0.35    Walnut Tree IgE      <0.35 kUA/L <0.35    [place] Grass IgE      <0.35 kUA/L <0.35    Goosefoot (L[place] Quarters) IgE      <0.35 kUA/L <0.35    Willow tree IgE      <0.35 kUA/L <0.35    WBC      4.5 - 11.0 10*9/L 5.1 7.7   RBC      4.00 - 5.20 10*12/L 5.74 (H) 5.98 (H)   HGB      12.0 - 16.0 g/dL 16.1 09.6   HCT      04.5 - 46.0 % 47.1 (H) 48.9 (H)   MCV      80.0 - 100.0 fL 82.1 81.8   MCH      26.0 - 34.0 pg 25.8 (L) 25.8 (L)   MCHC      31.0 - 37.0 g/dL 40.9 81.1   RDW      91.4 - 15.0 % 13.9 14.0   MPV      7.0 - 10.0 fL 6.4 (L) 7.9   Platelet      150 - 440 10*9/L 345 383   Neutrophils %      % 69.2 74.5   Lymphocytes %      % 22.3 18.6 Monocytes %      % 4.8 4.6   Eosinophils %      % 1.3 0.2   Basophils %      % 0.7 0.3   Absolute Neutrophils      2.0 - 7.5 10*9/L 3.6 5.7   Absolute Lymphocytes      1.5 - 5.0 10*9/L 1.1 (L) 1.4 (L)   Absolute Monocytes       0.2 - 0.8 10*9/L 0.2 0.4   Absolute Eosinophils      0.0 - 0.4 10*9/L 0.1 0.0   Absolute Basophils       0.0 - 0.1 10*9/L 0.0 0.0   Large Unstained Cells      0 - 4 % 2 2   Hypochromasia      Not Present  Slight (A)   CD3% (T Cells)      61 - 86 %OfLymphs 74    Absolute CD3 Count      915-3,400 Cells/uL 889 (L)    CD4% (T Helper)      34 - 58 %OfLymphs 44    Absolute CD4 Count      510-2,320 Cells/uL 529    CD8% T Suppressor      12 - 38 %OfLymphs 27    Absolute CD8 Count      180-1,520 Cells/uL 325  CD4:CD8 Ratio      0.9 - 4.8 1.6    CD19% (B Cells)      7 - 23 %OfLymphs 11    Absolute CD19 Count      105 - 920 Cells/uL 132    CD16/56% NK Cell      1 - 27 %OfLymphs 13    Absolute CD16/56      15-1,080 Cells/uL 156    Diphtheria IgG Av Value      IU/mL 0.95    Tetanus IgG Value      IU/mL >2.24    Diphtheria IgG Ab       Positive    Tetanus IgG Ab       Positive    Sed Rate      0 - 20 mm/h 4 1   IgM      35 - 290 mg/dL 301    Total IgG      601-0,932 mg/dL 3,557    IgE, Total      2-214 IU/mL IU/mL 420 (H)    IgA      40.0 - 400.0 mg/dL 322.0    CRP      <25.4 mg/L  <5.0 g/dL 27.0 62.3   HCT      76.2 - 46.0 % 47.1 (H) 48.9 (H)   MCV      80.0 - 100.0 fL 82.1 81.8   MCH      26.0 - 34.0 pg 25.8 (L) 25.8 (L)   MCHC      31.0 - 37.0 g/dL 83.1 51.7   RDW      61.6 - 15.0 % 13.9 14.0   MPV      7.0 - 10.0 fL 6.4 (L) 7.9   Platelet      150 - 440 10*9/L 345 383   Neutrophils %      % 69.2 74.5   Lymphocytes %      % 22.3 18.6   Monocytes %      % 4.8 4.6   Eosinophils %      % 1.3 0.2   Basophils %      % 0.7 0.3   Absolute Neutrophils      2.0 - 7.5 10*9/L 3.6 5.7   Absolute Lymphocytes      1.5 - 5.0 10*9/L 1.1 (L) 1.4 (L)   Absolute Monocytes       0.2 - 0.8 10*9/L 0.2 0.4   Absolute Eosinophils      0.0 - 0.4 10*9/L 0.1 0.0   Absolute Basophils       0.0 - 0.1 10*9/L 0.0 0.0   Large Unstained Cells      0 - 4 % 2 2   Hypochromasia      Not Present  Slight (A)   CD3% (T Cells)      61 - 86 %OfLymphs 74    Absolute CD3 Count      915-3,400 Cells/uL 889 (L)    CD4% (T Helper)      34 - 58 %OfLymphs 44    Absolute CD4 Count      510-2,320 Cells/uL 529    CD8% T Suppressor      12 - 38 %OfLymphs 27    Absolute CD8 Count      180-1,520 Cells/uL 325    CD4:CD8 Ratio      0.9 - 4.8 1.6    CD19% (B Cells)      7 -  23 %OfLymphs 11    Absolute CD19 Count      105 - 920 Cells/uL 132    CD16/56% NK Cell      1 - 27 %OfLymphs 13    Absolute CD16/56      15-1,080 Cells/uL 156    Diphtheria IgG Av Value      IU/mL 0.95    Tetanus IgG Value      IU/mL >2.24    Diphtheria IgG Ab       Positive    Tetanus IgG Ab       Positive    Sed Rate      0 - 20 mm/h 4 1   IgM      35 - 290 mg/dL 161    Total IgG      096-0,454 mg/dL 0,981    IgE, Total      2-214 IU/mL IU/mL 420 (H)    IgA      40.0 - 400.0 mg/dL 191.4    CRP      <78.2 mg/L  <5.0

## 2022-05-17 NOTE — Unmapped (Signed)
Return TOS    Patient Name: Vincent Waters DOB:[birth year]  Date of Encounter: 05/19/2022    Assessment:  Patient is a 41 y.o. male s/p right TOS decompression with pec minor release 11/16/21. Patient doing very well and has since stopped his opioid medications.     Plan:  - RTC in 6 months. Return precautions reviewed.     HPI: Vincent Waters is a 41 y.o. 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 Patient reports doing much better. He has been to pain management and is currently only on gabapentin and motrin. He states that he is off opioids and has since gone cold [place]. Dr. Oda Kilts has started him on TPIs. He does still endorse pain in his right shoulder. He has started PT with Jerene Dilling.     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 For 4 years, he has had limited shoulder mobility, right neck pain and stiffness, shoulder pain radiating down the arm, and tingling and cramping in the fingers in an ulnar distribution. (Patient initially complained of RUE sx but at follow up endorses a component of LUE sx as well). He was previously seen at [facility] and had glenohumeral injections and PT for possible frozen shoulder with some improvement. Patient saw Dr. Clydene Pugh in Ortho in 09/2019 who felt sx were possibly 2/2 TOS vs. cervical radiculopathy. EMG was ordered at that time but never obtained. Patient has also considered rotator cuff surgery with a provider in [place]. Previous shoulder MRIs have found mild tendinosis of supraspinatus tendon. Patient has tried PT and dry needling without relief.     Past Medical History:   Past Medical History:   Diagnosis Date    Anxiety     Asthma     Autoimmune autonomic neuropathy     Breast cyst Financial difficulties     Lack of access to transportation     Migraines     Neuromuscular disorder (CMS-HCC)     Scleroderma (CMS-HCC)     Visual impairment        Past Surgical History:  Past Surgical History:   Procedure Laterality Date    CHOLECYSTECTOMY      HYSTERECTOMY Bilateral     08/2017    MASTECTOMY      04/2018    OOPHORECTOMY  2005    laparotomy    PR CYSTOURETHROSCOPY N/A 08/30/2017    Procedure: CYSTOURETHROSCOPY (SEPARATE PROCEDURE);  Surgeon: Forbes Cellar, MD;  [facility];  Service: Advanced Laparoscopy    PR EXCISION TURBINATE,SUBMUCOUS Bilateral 01/04/2021    Procedure: SUBMUCOUS RESECTION INFERIOR TURBINATE, PARTIAL OR COMPLETE, ANY METHOD;  Surgeon: Adam Swaziland Kimple, MD;  [facility];  Service: ENT    PR INCISE TENDON/MUSCLE,SHLDR,SINGLE Right 11/16/2021    Procedure: RIGHT PEC MINOR RELEASE;  Surgeon: Thora Lance, MD;  Location: MAIN OR [facility];  Service: Vascular    PR LAPAROSCOPY W TOT HYSTERECTUTERUS <=250 GRAM  W TUBE/OVARY N/A 08/30/2017    Procedure: LAPAROSCOPY, SURGICAL, WITH TOTAL HYSTERECTOMY, FOR UTERUS 250 G OR LESS; W/REMOVAL TUBE(S) AND/OR OVARY(S);  Surgeon: Forbes Cellar, MD;  Location[facility];  Service: Advanced Laparoscopy    PR MASTECTOMY, SIMPLE, COMPLETE Bilateral 01/09/2020  Procedure: MASTECTOMY, SIMPLE, COMPLETE;  Surgeon: Carola Rhine, MD;  Location: MAIN OR [place];  Service: Plastics    PR NEUROPLASTY BRACHIAL PLEXUS,OPEN Right 11/16/2021    Procedure: RIGHT TOS DECOMPRESSION;  Surgeon: Thora Lance, MD;  Location: MAIN OR [place];  Service: Vascular    PR REPAIR OF NASAL SEPTUM N/A 01/04/2021    Procedure: SEPTOPLASTY OR SUBMUCOUS RESECTION, WITH OR WITHOUT CARTILAGE SCORING, CONTOURING OR REPLACEMENT WITH GRAFT;  Surgeon: Adam Swaziland Kimple, MD;  Location: [facility];  Service: ENT    PR UPPER GI ENDOSCOPY,BIOPSY N/A 08/13/2019    Procedure: UGI ENDOSCOPY; WITH BIOPSY, SINGLE OR MULTIPLE; Surgeon: Alfred Levins, MD;  Location: [facility];  Service: Gastroenterology    SINUS SURGERY         Social History:  Patient  reports that he quit smoking about 9 months ago. His smoking use included e-cigarettes. He started smoking about 10 months ago. He has never used smokeless tobacco. He reports that he does not currently use alcohol. He reports current drug use. Drug: Marijuana.    ROS:  Balance of systems reviewed x 10 and negative except as noted in HPI.      DASH Score  Date   40 05/19/2022     Physical Examination:   Vitals:    05/19/22 1259   BP: 107/65   Pulse: 82   Temp: 36.8 ??C (98.2 ??F)     General: Well appearing, no acute distress, neuro intact  Psych: Awake, Alert , Oriented by 3. Affect is appropriate.  Neck: Supple, no evidence of JVD, no evidence of thyromegaly, no cervical bruits  Cardiac: RRR  Lungs: Easy work of breathing, CTA Bilaterally  Upper extremities: Warm and well perfused. Radial artery pulses bilaterally 2+. Limited RUE ROM and shoulder mobility.   ROOS: negative    Imaging (independently reviewed):    None          25 minutes  spent face-to-face counseling and coordination of care and greater than 50% of the visit was spent face-to-face    ______________________________________________________________________    Documentation assistance was provided by Eugenia Pancoast, Scribe, on May 19, 2022 at 1:41 PM for Galen Daft, [place], and Thora Lance, MD    ----------------------------------------------------------------------------------------------------------------------  May 24, 2022 10:36 AM. Documentation assistance provided by the Scribe. I was present during the time the encounter was recorded. The information recorded by the Scribe was done at my direction and has been reviewed and validated by me.  ----------------------------------------------------------------------------------------------------------------------

## 2022-05-18 MED FILL — IBUPROFEN 800 MG TABLET: ORAL | 20 days supply | Qty: 60 | Fill #1

## 2022-05-19 ENCOUNTER — Ambulatory Visit
Admit: 2022-05-19 | Discharge: 2022-05-20 | Payer: PRIVATE HEALTH INSURANCE | Attending: Specialist | Primary: Specialist

## 2022-05-19 NOTE — Unmapped (Addendum)
We are so happy that you are feeling better. Continue with PT and work with them to improve your strength and range.  We will see you back in 6 months

## 2022-05-20 NOTE — Unmapped (Signed)
[facility] THERAPY SERVICES AT [facility] [place]  OUTPATIENT PHYSICAL THERAPY  05/20/2022  Note Type: Treatment Note       Patient Name: Vincent Waters  Date of Birth:1980/09/05  Diagnosis:   Encounter Diagnoses   Name Primary?    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  He is already feeling much better and his joint is moving better and muscles are significantly more relaxed after last session.  Focused on manual therapy to help to release remaining restriction, followed by AAROM and stretching to help movement.  He tolerated all interventions, with just a bit of discomfort.  Written instructions given for these exercises.  He committed to performing them daily as part of his home exercise program.  All questions answered.  This patient requires skilled physical therapy services to address the outlined impairments in order to return to his desired level of function.           Impairments: decreased strength, hypertonicity, impaired ADLs, muscular restrictions, impaired flexibility, impaired sensation, joint restriction, pain, postural weakness and trigger points                      Therapy Goals      Goals:      Short Term Goals:  In 6 weeks:  Goal #1: Patient will be independent with progressively monitored home exercise program.   Baseline/Current: Patient is unfamiliar with home exercise program.     Goal #2: Patient will demonstrate cervical ROM within normal limits with minimal symptoms in order to allow for functional activities such as safe driving and looking before crossing the street.   Baseline/Current: Patient demonstrates painful and limited cervical ROM (see ROM testing).     Goal #3: Patient will demonstrate right shoulder range of motion within normal limits with minimal symptoms for proper glenohumeral joint mechanics in order to allow for daily functional tasks.   Baseline/Current: Patient has painful and limited right shoulder range of motion (see ROM testing).     Goal #4: Patient will demonstrate improved posture without cuing.  Baseline/Current: Patient has slumped posture and rounded shoulders.    Long Term Goals:  In 12 weeks:  Goal #1: Patient will demonstrate right shoulder strength within normal limits with minimal symptoms in order to allow for pain free lifting and reaching with right upper extremity.   Baseline/Current: Patient has painful and limited right shoulder strength (see manual muscle testing).     Goal #2: Patient will be able to reach over head with minimal symptoms in order to place/remove an object over shoulder height in a cabinet.  Baseline/Current: Patient has difficulty reaching over head at present.    Goal #3: Patient will return to recreational activities (working out in gym) without limit.  Baseline/Current: Patient unable to do any upper body exercises at present.    Goal #4: Patient's FOTO score will improve from 34 at initial evaluation to >/= 52 at discharge in order to demonstrate a  significant improvement in self-reported outcome measures that directly result from his course of physical therapy.   Baseline/Current: FOTO = 34    Plan    Therapy options: will be seen for skilled physical therapy services    Planned therapy interventions: Body Mechanics Training, Dry Needling, Education - Patient, Home Exercise Program, Manual Therapy, Neuromuscular Re-education, Postural Training, Therapeutic Activities and Therapeutic Exercises      Frequency: 1x week    Duration in weeks: 12    Education provided to: patient.    Education provided: HEP, Body mechanics, Importance of Therapy, Treatment options and plan, Symptom management and Body awareness    Education results: verbalized good understanding, demonstrates understanding and needs further instruction.    Communication/Consultation: N/A.    Next visit plan:        Manual therapy to promote improved SterC, AcroC, GHJ motion and loosening muscles, stretching, AAROM, postural/shoulder strengthening    Total Session Time: 40    Treatment rendered today:      Therapeutic Exercise: 20 minutes  - Patient Education: Home Exercise Program With Handout  - Supine Bilateral Wand Flexion: 20 x  - Supine Unilateral Pec Stretch: 10 x 10  - Supine HBH: 15 x  - Seated Upper Trapezius Stretch: 2 x 30  - Bilateral Wand Up Back: 20 x    Manual Therapy: 20 minutes  - AC mobilizations in supine including MWM with right upper extremity  - StC mobilizations in supine including MWM with right upper extremity  - GHJ AP in various amounts of abduction  - PROM Shoulder Flexion with Inferior glide  - Pin and Stretch to right scapular depressors  - GHJ AP @ 90 degrees in IR  - Pin and Stretch Right Upper Trapezius    Plan details: Physiotec ID: [id number]       Subjective:   History of Present Condition  Date of Surgery: 11/16/2021  Surgical Procedure: Other (Right anterior scalenectomy, middle scalenectomy, first rib resection and right Pectoralis minor release)  History of Present Condition/Chief Complaint:       Vincent Waters is a 41 y.o. right hand dominant adult who presents to outpatient physical therapy with right neck and shoulder pain that began in early 2018 when he was changing the oil in his car when it fell onto his sternum.  He went to the ER and got an x-ray which showed a sternal fracture.  Since then, he has had persistent issues and nerve pressure in his right neck/shoulder which led him to get a right anterior scalenectomy, middle scalenectomy, first rib resection and right Pectoralis minor release surgery performed on 11/16/2021 by Thora Lance, MD. 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    Date of Onset:  09/18/2016  Date is an approximation?: yes  Explanation:  Patient reports onset of symptoms in early 2018.     Subjective:     Vincent Waters reports that he is feeling better.  The PT exercises are helping and he had injections which has helped his pain.  He has been doing his exercises consistently.    Pain  Current pain rating: 0  Location: right lateral neck/right chest  Quality: pressure, sharp and tightness  Progression: improved          Diagnostic Tests    Diagnostic Test Comments:                     Objective:  Static Posture Assessment   Shoulders-Rounded.  Thoracic Spine-Hyperkyphosis.    Palpation     Right Shoulder   Hypertonic in the latissimus, pectoralis major, pectoralis minor, subscapularis and teres major.     Right Cervical/Thoracic   Hypertonic in the latissimus, pectoralis major and pectoralis minor.     Joint Mobility  Shoulder  Right Shoulder Joint Mobility    Posterior capsule: hypomobile  AC joint: hypomobile  [place] joint: hypomobile                                  I attest that I have reviewed the above information.  Signed: Patton Salles, PT  05/20/2022 11:35 AM

## 2022-05-24 ENCOUNTER — Institutional Professional Consult (permissible substitution): Admit: 2022-05-24 | Discharge: 2022-05-25 | Payer: PRIVATE HEALTH INSURANCE

## 2022-05-24 DIAGNOSIS — L501 Idiopathic urticaria: Principal | ICD-10-CM

## 2022-05-24 MED ADMIN — omalizumab (XOLAIR) injection 300 mg: 300 mg | SUBCUTANEOUS | @ 17:00:00

## 2022-05-24 NOTE — Unmapped (Signed)
Patient in for Xolair injections today. Received Xolair 300 mg subcutaneously, tolerated injections well. No other complaints at this time.      Patient demonstrated Xolair self-administration. Patient given at home injection instructions and number of specialty pharmacy to call to set up deliveries.

## 2022-05-25 NOTE — Unmapped (Signed)
Per clinic, pt is ready for home administration of Xolair. Moving to appropriate queue for follow-up to send med directly to pt's home moving forward.

## 2022-05-26 ENCOUNTER — Ambulatory Visit: Admit: 2022-05-26 | Discharge: 2022-05-27 | Payer: PRIVATE HEALTH INSURANCE

## 2022-05-26 LAB — COMPREHENSIVE METABOLIC PANEL
ALBUMIN: 3.9 g/dL (ref 3.4–5.0)
ALKALINE PHOSPHATASE: 103 U/L (ref 46–116)
ALT (SGPT): 17 U/L (ref 10–49)
ANION GAP: 6 mmol/L (ref 5–14)
AST (SGOT): 22 U/L (ref <=34)
BILIRUBIN TOTAL: 0.5 mg/dL (ref 0.3–1.2)
BLOOD UREA NITROGEN: 14 mg/dL (ref 9–23)
BUN / CREAT RATIO: 16
CALCIUM: 9.8 mg/dL (ref 8.7–10.4)
CHLORIDE: 104 mmol/L (ref 98–107)
CO2: 30 mmol/L (ref 20.0–31.0)
CREATININE: 0.87 mg/dL
EGFR CKD-EPI (2021) MALE: >90 mL/min/{1.73_m2} (ref >=60)
GLUCOSE RANDOM: 89 mg/dL (ref 70–99)
POTASSIUM: 4.8 mmol/L (ref 3.5–5.1)
PROTEIN TOTAL: 7 g/dL (ref 5.7–8.2)
SODIUM: 140 mmol/L (ref 135–145)

## 2022-05-26 LAB — TESTOSTERONE: TESTOSTERONE TOTAL: 247 ng/dL — ABNORMAL HIGH

## 2022-05-26 LAB — LIPASE: LIPASE: 43 U/L (ref 12–53)

## 2022-05-26 LAB — HEMATOCRIT: HEMATOCRIT: 44.6 % (ref 36.0–46.0)

## 2022-05-26 NOTE — Unmapped (Signed)
Hello! It was great to see you. Your lab results are all within normal limits, so no further action is necessary at this time. Please let me know if you have any questions or concerns!    Take care,    Claudie Revering, MD, MPH (he/him)  Resident Physician, PGY-2  Department of Family Medicine    To schedule an appointment, please call our clinic at [phone].    For the after-hours nurse line, please call [phone]. If you think you are having an emergency, please call 911 or go to your nearest emergency department. ??    Please allow 2-5 business days for a response to your MyChart message.

## 2022-05-26 NOTE — Unmapped (Signed)
Hi Jong,    It was great to see you today. I look forward to seeing you at your next visit. In the meantime, for non-urgent questions, please message our clinic on MyChart. I am typically able to respond to these messages within 3-5 business days. For more urgent matters, please call our clinic at [phone]. In the event of a medical emergency, call 911 and/or visit our urgent care or an emergency room.     Take care,    [name]    [name] Allsion Nogales, MD, MPH   He, him, his  Resident Physician, PGY-3    [facility]   [address]   [place], [place] [address]   [phone] (Phone); [phone] (Fax)     [facility] MyChart is for non-urgent messages. I do my best to respond within 3 business days, however occasionally it may take longer. If you have immediate concerns, contact our clinic by phone [phone].      [facility] has an Urgent Care!  Family Medicine Urgent Care Hours: Monday-Friday 7am-8pm; Saturday and Sunday 12pm-5pm   If you think you are having an emergency, please call 911 or go to your nearest emergency department.

## 2022-05-26 NOTE — Unmapped (Signed)
Hi Gorje,     Great to see you today. All labs look great, including your liver labs.     Let me know if you have any questions or concerns -- you're also welcome to schedule an in-person or virtual visit with me by calling [phone]. Otherwise, I'll see you at your next follow-up visit!    Take care,    Claudie Revering, MD, MPH   He, him, his  Resident Physician, PGY-2    [facility]   [address]   [place], [place] [address]   [phone] (Phone); [phone] (Fax)     [facility] MyChart is for non-urgent messages. I do my best to respond within 3 business days, however occasionally it may take longer. If you have immediate concerns, contact our clinic by phone [phone].   ??  [facility] has an Urgent Care!  Family Medicine Urgent Care Hours: Monday-Friday 7am-8pm; Saturday and Sunday 12pm-5pm   If you think you are having an emergency, please call 911 or go to your nearest emergency department.

## 2022-05-26 NOTE — Unmapped (Signed)
[facility]- [facility]  Established Patient Clinic Note    Assessment/Plan:   Vincent Waters is a 41 y.o.adult who presents for follow-up.    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 Charm Barges    # Gonorrhea   Throat and vaginal swabs positive at 11/19 urgent care visit after oral sex encounter (performing and receiving) with a new partner. 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 S/p CTX injection, TOC ordered by UC provider 2 weeks ago to be collected today. Symptoms (discharge) resolved entirely within 2 days of CTX.   - TOC today  - Discussed safer sex practices  - Continuing with PrEP, no refill needed today    # Gender-affirming care  Pt has been doing well on current dose, and is satisfied with changes. No new physical changes, has been on stable dose for many years. Denies chest pain, shortness of breath, dizziness, lightheadedness, vision changes, or any other unusual symptoms. Not engaging in penetrative sex of any kind, will defer UPT. Requests THP referral to discuss bottom surgery, will place referral today.   - Continue testosterone cypionate 40 mg weekly  - Labs today: testosterone, hematocrit  - Provided [place] self-injection guide via MyChart  - On PrEP, no refill needed today  - THP referral to discuss bottom surgery    # Reflux - RUQ pain  Pt recently established with new GI doctor and would like to switch providers if possible. Will place new referral to [facility] at [facility] I continue to be concerned for possible eosinophilic esophagitis given longstanding reflux with severe allergies requiring xolair, however unfortunately biopsy was not able to be completed at last endoscopy in 2021 due to adequate prep. Recommended he discuss this with GI. Pt also with new mild RUQ pain, will check CMP and lipase today.   - GI referral  - CMP  - Lipase    HEALTH MAINTENANCE ITEMS STILL DUE:  Health Maintenance Due   Topic Date Due    Pneumococcal Vaccine 0-64 (2 - PCV) 06/24/2021     Follow-up: No follow-ups on file.    Future Appointments   Date Time Provider Department Center   05/27/2022  8:00 AM Royetta Crochet, MD [facility]   05/30/2022  5:45 PM Seymour Bars, PT [facility]   05/31/2022  8:00 AM Lobonc, Ammie Ferrier, MD [facility]   06/07/2022  9:30 AM Mena Goes, PsyD [facility]   06/07/2022  1:45 PM Seymour Bars, PT [facility]   06/09/2022 12:00 PM Lobonc, Ammie Ferrier, MD ANESPAINMRKT [facility]   06/24/2022  9:00 AM Kern Reap, MD [facility]   06/28/2022 11:30 AM Seymour Bars, PT [facility]   07/05/2022 11:30 AM Seymour Bars, PT [facility]   07/06/2022  9:30 AM Goetzinger, Mardene Celeste, PhD ANESPAINMRKT [facility]  07/12/2022 10:45 AM Myer, Sharyn Creamer, PT [facility]   07/19/2022 10:45 AM Seymour Bars, PT [facility]   08/03/2022 10:20 AM Mahoro, Champ Mungo, MD [facility]CGIM[facility]   09/12/2022 10:30 AM [name], MD [facility]CALLERGET [facility]   09/26/2022  8:00 AM [name], MD [facility]   09/26/2022  8:30 AM Oren Bracket, MD [facility]CALLERGET [facility]   11/17/2022  9:00 AM Thora Lance, MD [facility]G [facility]       Subjective   Vincent Waters is a 41 y.o. adult  coming to clinic today for the following issues:    Chief Complaint   Patient presents with    Follow-up     On going issues hip pain     HPI: see above for more information.     I have reviewed the problem list, medications, and allergies and have updated/reconciled them if needed.    Vincent Waters  reports that he has quit smoking. His smoking use included cigarettes. He has never used smokeless tobacco.  Health Maintenance   Topic Date Due    Pneumococcal Vaccine 0-64 (2 - PCV) 06/24/2021    Lipid Screening  01/22/2025    DTaP/Tdap/Td Vaccines (2 - Td or Tdap) 08/19/2027    Hepatitis C Screen  Completed    COVID-19 Vaccine  Completed    Influenza Vaccine  Completed       Objective     VITALS: BP 112/70 (BP Site: R Arm, BP Position: Sitting, BP Cuff Size: Large)  - Pulse 78  - Temp 35.7 ??C (96.3 ??F) (Temporal)  - Ht 162.6 cm (5' 4)  - Wt 75.1 kg (165 lb 9.6 oz)  - LMP 07/20/2017 (Exact Date)  - BMI 28.43 kg/m??     Physical Exam  General: well-appearing, sitting upright in no acute distress  Head: Normocephalic, atraumatic  ENT: No dental trauma noted.   Eyes: conjunctiva normal, non-erythematous, non-icteric, no discharge.  Neck: no thyroid enlargement or masses  Lungs: No increased work of breathing or audible wheezing  Skin: Warm, dry, no erythema or rash on exposed skin  Musculoskeletal: No visible gait abnormalities  Neurologic: Alert & oriented x 3, no gross sensorimotor abnormalities  Psychiatric: Pleasant, cooperative, good eye contact, appropriate thought processes    Wt Readings from Last 3 Encounters:   05/26/22 75.1 kg (165 lb 9.6 oz)   05/19/22 76.1 kg (167 lb 11.2 oz)   05/08/22 73.5 kg (162 lb)      PHQ-9 PHQ-9 TOTAL SCORE   10/22/2020   9:00 AM 6   09/04/2020   9:00 AM 3   08/21/2020   9:00 AM 5   05/22/2020   3:00 PM 9     LABS/IMAGING  I have reviewed pertinent recent labs and imaging in Epic    Alver Fisher, MD, MPH (he/him)  Resident Physician, PGY-3  Department of Family Medicine        [name]  [address] [place], [place] [address]  Telephone [phone]  Fax [phone]  [email or web address]

## 2022-05-27 ENCOUNTER — Ambulatory Visit: Admit: 2022-05-27 | Discharge: 2022-05-28 | Payer: PRIVATE HEALTH INSURANCE

## 2022-05-27 ENCOUNTER — Ambulatory Visit
Admit: 2022-05-27 | Discharge: 2022-05-28 | Payer: PRIVATE HEALTH INSURANCE | Attending: Family Medicine | Primary: Family Medicine

## 2022-05-27 MED ADMIN — dexAMETHasone (DECADRON) 4 mg/mL injection 2 mg: 2 mg | INTRA_ARTICULAR | @ 13:00:00 | Stop: 2022-05-27

## 2022-05-27 MED ADMIN — ropivacaine (NAROPIN) 5 mg/mL (0.5 %) injection 3 mL: 3 mL | INTRA_ARTICULAR | @ 13:00:00 | Stop: 2022-05-27

## 2022-05-27 MED ADMIN — triamcinolone acetonide (KENALOG-40) injection 20 mg: 20 mg | INTRA_ARTICULAR | @ 13:00:00 | Stop: 2022-05-27

## 2022-05-27 NOTE — Unmapped (Signed)
[facility]    Subjective: 41 y.o. adult with  has a past medical history of Anxiety, Asthma, Autoimmune autonomic neuropathy, Breast cyst, Financial difficulties, Lack of access to transportation, Migraines, Neuromuscular disorder (CMS-HCC), Scleroderma (CMS-HCC), and Visual impairment. who presents for:    Patient seen at the request of Kern Reap, MD or Self, Referred for Hip Injury (Pt reports that his L hip has been painful. Pt reports around winter the pain increases, Dull achy pain. Pt states it starts around lumbar spine and radiates towards his L leg. Pain worsens when sitting down for long periods or going up stairs. Pt is here for a CSI.)    Last injection helped 04/2021, flared back up in March but was okay through the summer. This flare started back up end of October. Sees physical therapist for shoulder, will continue to work for them with hip after.     Red flags: none    All relevant medical chart documents reviewed.    Objective:   Ht 162.6 cm (5' 4.02)  - Wt 74.8 kg (165 lb)  - LMP 07/20/2017 (Exact Date)  - BMI 28.31 kg/m??     Comprehensive Left Hip Exam:  Inspection:   Ecchymosis: No  TTP:   greater trochanter  ROM  Int: Normal  Ext: Normal  Flexion: Normal  Strength:  Abd: 5/5  Flex: 5/5  Special Test:  FADIR: Negative  FABER: Negative  Leg lengths: equal      Imaging: No results found.      I personally reviewed images if needed/appropriate and agree with interpretation.     Assessment:   Diagnosis ICD-10-CM Associated Orders   1. Bilateral buttock pain  M79.18 XR Hips Bilateral with Pelvis 5 views or more      2. Greater trochanteric bursitis of left hip  M70.62 dexAMETHasone (DECADRON) 4 mg/mL injection 2 mg     ropivacaine (NAROPIN) 5 mg/mL (0.5 %) injection 3 mL     triamcinolone acetonide (KENALOG-40) injection 20 mg          Plan:  I discussed work up and management/treatment options with patient.  See orders and AVS for specifics regarding home exercise program, OTC and prescription medications, supplements, labs, imaging, and referrals.    Trochanteric bursitis  L hip injection  Xray of pelvis    Trochanteric Bursa Injection left    Consent   After discussing the various treatment options for the condition, It was agreed that a corticosteroid injection would be the next step in treatment. The nature of and the indications for a corticosteroid and / or local anaesthetic injection were reviewed in detail with the patient today. The inherent risks of injection including infection, allergic reaction, increased pain, incomplete relief or temporary relief of symptoms, alterations of blood glucose levels requiring careful monitoring and treatment as indicated, tendon, ligament or articular cartilage rupture or degeneration, nerve injury, skin depigmentation, and/or fatty atrophy were discussed.   Procedure   After the risks and benefits of the procedure were explained,verbal consent was given, and a procedural time-out was performed. The Greater Trochanter was identified.  The site area of maximal tenderness was marked and prepped with Chlorhexidine solution.  The injection site was anesthetized with ethyl chloride.  The trochanteric bursa was injected with 20 milligrams of Kenalog, 2 milligrams of Dexamethasone, and 3 milliliters of .5% Ropivacaine using a sterile technique and a 22 [place] 3.5 inch needle. During injection, there was unrestricted flow and care was taken not  to inject corticosteroid into the skin or subcutaneous tissues. There were no complications during the procedure.    Post procedure a sterile band-aide was applied. Post-injection instructions were given regarding post-procedure care, when to follow up in clinic and what to expect from the procedure. The patient tolerated the injection well and was discharged without complication.            Return if symptoms worsen or fail to improve.    Noe Gens, MD MPH  FM PGY-1

## 2022-05-27 NOTE — Unmapped (Signed)
Procedural Timeout    Correct patient?: yes    Correct procedure and correct site?: yes    Safety Precautions: Allergies reviewed?: yes    All relevant documents and studies (history and physical, laboratory, pathology, radiology, etc.) available?: yes    Consent completed?: yes    Required items for procedure (implants, devices, special equipment) available: yes    Expiration dates checked?: yes    Site marked, if applicable?:yes

## 2022-05-27 NOTE — Unmapped (Signed)
Thank you for coming to [facility] clinic today.    We aim to provide you with the highest quality care.  If you have any unanswered questions after the visit or need to schedule a follow up visit, please do not hesitate to call us:    [facility] [place]:  [phone]    [facility] [place]:  [phone]    You can also use MyChart to reach out to us.    We look forward to seeing you again in the future and appreciate you coming to [facility].    Thank you.    Copeland Lapier, MD       Thank you for choosing [facility] for your care today!    Today you received a cortisone injection.  See below for postinjection details/instructions    1.  Part of the medication injected was numbing medication, which should wear off completely in 12 to 24 hours.  2.  One of the steroid medicines will take effect in the next 2 to 3 days.  3.  The other steroid medicine will take effect in approximately 1 week, but may take up to 2 weeks. Please be patient during this time to see the full effect of the injected medication.  4.  Recommend modifying your activity to just activities of daily living for 1-2 days  5.  After 1-2 days, you can resume physical therapy exercises and normal activities

## 2022-05-28 NOTE — Unmapped (Signed)
I was the supervising physician in clinic and I was available during Dr. Marzetta Board clinic session.  I agree with the assessment and plan as documented in his note.  Patriciaann Clan, DO

## 2022-05-30 DIAGNOSIS — A549 Gonococcal infection, unspecified: Principal | ICD-10-CM

## 2022-05-30 DIAGNOSIS — A64 Unspecified sexually transmitted disease: Principal | ICD-10-CM

## 2022-05-30 NOTE — Unmapped (Addendum)
[facility]  Ambulatory E-Consult Note      RECOMMENDATIONS    Diagnostic  ??? Repeat G/C throat and vaginal swab  ??? Also send gonorrhea culture from both sites  ??? Request susceptibilities on gonorrhea if it grows from the culture (M[facility] order in Epic)    Treatment  ??? Pending repeat swabs and culture, may need higher dose of Ceftriaxone or an alternative antibiotic         Assessment:  41yo transgender male with recurrent vaginal gonorrhea in the setting of throat/oral gonorrhea cure after treatment. This is an unusual situation, and a few things are possible, either the repeat throat swab was not an adequate sample, and there is resistance to Ceftriaxone or there was a new exposure to gonorrhea in between testing. Regardless, I recommend repeat throat and vaginal swab for regular G/C testing, with the addition of a gonorrhea culture from both sites, which would allow for susceptibility testing in the event of resistance. If MICs for Ceftriaxone are high, they may in some cases be overcome by an even higher dose (ie. 1gm IM), or it may be that this patient needs to be treated with an alternative antibiotic.      I spent 11-15 minutes in medical consultative discussion and review of medical records, including a written report to the treating provider via electronic health record regarding the condition of this patient.    This e-Consult did include an answerable clinical question and did not recommend a clinic visit.    Dione Housekeeper, MD  [facility]    The recommendations provided in this eConsult are based on the clinical data available to me and are furnished without the benefit of a comprehensive in-person evaluation of the patient. Any new clinical issues or changes in patient status not available to me will need to be taken into account when assessing these recommendations. The ongoing management of this patient is the responsibility of the referring clinician. Please contact me if you have further questions.  ___________________    Bonita Quin asked:  Management of treatment failure of vaginal gonorrhea.      To summarize:  41 yo transgender male to male with recent dx of gonorrhea of throat and vagina (swabs done on 05/08/22), s/p 500mg  IM ceftriaxone. Retested two weeks later for test of cure (05/26/22) with a history of only oral sexual encounter, no vaginal sex during the two weeks per patient, and throat results have cleared but vaginal re-testing remained positive.

## 2022-05-31 ENCOUNTER — Ambulatory Visit
Admit: 2022-05-31 | Discharge: 2022-05-31 | Payer: PRIVATE HEALTH INSURANCE | Attending: Anesthesiology | Primary: Anesthesiology

## 2022-05-31 ENCOUNTER — Ambulatory Visit: Admit: 2022-05-31 | Discharge: 2022-05-31 | Payer: PRIVATE HEALTH INSURANCE

## 2022-05-31 DIAGNOSIS — G894 Chronic pain syndrome: Principal | ICD-10-CM

## 2022-05-31 DIAGNOSIS — M7072 Other bursitis of hip, left hip: Principal | ICD-10-CM

## 2022-05-31 DIAGNOSIS — G540 Brachial plexus disorders: Principal | ICD-10-CM

## 2022-05-31 DIAGNOSIS — M7918 Myalgia, other site: Principal | ICD-10-CM

## 2022-05-31 MED FILL — GABAPENTIN 250 MG/5 ML ORAL SOLUTION: ORAL | 30 days supply | Qty: 1080 | Fill #1

## 2022-05-31 NOTE — Unmapped (Addendum)
Department of Anesthesiology  [facility]  [address]  [place], [place] [address]  [phone]    Chronic Pain Follow-Up Note  1. TOS (thoracic outlet syndrome)    2. Myofascial pain    3. Chronic pain syndrome    4. Bursitis of left hip, unspecified bursa        Assessment and Plan  Vincent Waters is a 41 y.o. 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 Gayla Doss in May 2023.  He was first seen by our clinic in March 2023 for diagnostic injections.  His range of motion has an increased postoperatively, but still has tight, spasming pain in the right neck and shoulder.  He is currently weaning off benzodiazepines, discontinued opioid medications in late 2023 which he was chronically on for 3 years, and inquires about optimizing medications and procedures to improve his baseline pain at this time.  His primary pain generator includes myofascial pain post-TOS decompression.  He has other medical history that includes anxiety, scleroderma, migraines, autoimmune autonomic neuropathy, asthma.  Also follows with sports medicine and has undergone injections there as well.      December 2023  At last visit in November, the patient presented with worsened pain localized to right neck and shoulder area. He denied radiation of pain down his RUE. His pain seemed to have a myofascial component and we discussed TPIs and initiation of baclofen to assist with myofascial pain.  Patient noted benefit with gabapentin liquid and reported being on this medication for several years. He was currently weaning his clonazepam with his PCP. Patient endorsed withdrawal symptoms as he noted stopping Percocet last week after 3 years. We had discussion about the longevity of TOS surgery and that the healing process could take up a long duration to achieve benefit. The patient's GI team stated to avoid NSAIDs, we would agree as this upsets his stomach and he had a history of gastroparesis.  The patient was not amenable to a pain psychology evaluation, UDS at last visit in order to consider initiation of a buprenorphine product in the future should he wean off of benzodiazepines, fail more conservative management, and continue to have severe, uncontrolled pain.  But for now hopefully we could help improve pain with more conservative measures.    Today, patient presents reporting substantial improvement in pain, in which he attributes to starting PT and having TPIs on 05/10/22. He reports the TPI's brought his pain from a 9 down to a 5. He is currently taking gabapentin 600 mg TID and Motrin 400-800 mg nightly prn with benefit. We advised patient not increase his NSAIDs use (per GI team recs) and patient understood to be cautious. The patient is currently working on tapering his Klonopin with outside provider. He is hesitant to take Baclofen due to not being a fan of muscle relaxant and notes taking sparingly. He recently had left GTB injection with sports med and updated imaging for left hip bursitis. We will continue his regimen without changes. Patient has repeat TPIs scheduled for next week.     Thoracic Outlet Syndrome - Chronic Pain Syndrome  - Continue following with PT  - Scheduled repeat TPI's on 06/09/22  - Continue gabapentin 600 mg oral solution TID, we will formally take this over, but has refills currently  - Continue clonazepam taper with PCP  - Continue baclofen 5-10  mg BID prn (pt reports using sparingly)    Left Trochanteric Bursitis  Follows with [facility] and recently underwent cortisone injection on 12/8 with 2 days of relief. Hip XR at that time showed minimal bilateral SI joint arthritis, unchanged from prior.  - Could consider MRI hips or further imaging to r/o muscle tear & other injuries given chronic nature of this pain.  But discussed following up sports med with results of CSI given they have followed this pain and have done injections previously.      Considerations for the future:  -Consider initiation of Butrans/buprenorphine product after weaning off of benzodiazepines and pain psych evaluation for COM should conservative measures fail, but hoping to avoid if possible  -Consider up titration of gabapentin oral solution or rotation to Lyrica   -Hip MRI without contrast    Urine toxicology: 04/28/22  Urine toxicology screen was appropriate.   Last Opioid Change: Last Rx in 04/28/22  Last EKG: N/A  Previous Compliance Issues: None  Naloxone ordered: no  Total morphine equivalents: 0  Benzodiazepine: Yes. Weaning.  [place] database was reviewed 05/31/22     Return for February, MD or NP.    No orders of the defined types were placed in this encounter.    Requested Prescriptions      No prescriptions requested or ordered in this encounter         HPI:  Vincent Waters is seen in consultation at the request of Dr. Gayla Doss for evaluation and recommendations regarding His pain.     Patient was last seen in November. Since last visit, the patient has followed with PT, Vasc Surg, and Allergy. He underwent TPIs with our clinic 05/10/22.     Today, the patient presents reporting improvement in pain with TPIs at the end of November. He reports benefit from starting PT. The patient reports pain has become tolerable. The patient states his pain at last visit was a 9/10 and is now a 5/10 in which he attributes to TPIs and starting PT. He reports worsened pain with colder weathers. The patient states his muscle tightness has lessened. The patient reports having a cortisone injection in his hip last Friday. Though, the patient reports his muscle tightness is the most bothersome pain at this time.     In terms of medications, he reports taking gabapentin 600 mg TID and Motrin 400-800 mg sparingly at night with benefit. He reports he is hesitant to use baclofen due to it being a muscle relaxant. The patient reports taking Klonopin 0.25 mg as needed. He reports having half a bottle left from his November refill of #30 tablets.     Current Medications:  Percocet, related to multiple pains- no longer taking  Gabapentin 600 mg TID- has been on for years  Voltaren gel  Baclofen prn     Current view: Showing all answers            [facility] Patient Questionnaire       Question 05/31/2022  7:45 AM EST - Filed by Patient    What is the reason for your visit? Post procedure assessment    Date of onset of your pain: 11/16/2021    Please rate your pain at its WORST in the past month. 7    Please rate your pain at its LEAST in the past month. 4    Please rate your pain as it is RIGHT NOW. 4    Please rate your  pain on AVERAGE in the past month. 5    Please circle the location of your pain.       Please select the words that describe your pain. Aching     Pressing     Sharp    How often do you have pain? Other    When is your pain the worst? Evenings    Which of the following have been negatively affected by your pain? Enjoyment of life     General activity     Mood     Recreational activities    Since your last visit:     Have you had any of the following? Primary Care Visit    Do you have any new pain you would like to discuss with your doctor? No    How has your pain changed? Better    Are you currently taking any blood-thinners or anticoagulants? No    If you are on Pain Medication - Are you having any of the following? None    If you have had a procedure since your last visit, how much pain relief was obtained? 50%    If you have had a procedure since your last visit, were there any complications?       General: Night Sweats    Cardiovascular:       Gastrointestinal - (Intestinal): Diarrhea     Heartburn     Gastric Reflux    Skin: Itching     Dryness    Endocrine (Hormonal System): Increased Sweating     Excess Thirst     Hot Flashes Musculoskeletal System - (Muscles, Joints and Coverings): Joint Aches/Swelling     Back pain     Muscle Aches/Weakness    Neurologic: Dizziness/Vertigo     Headaches/Migraines    Psychiatric: Anxiety     Low Concentration          [facility] Patient-Entered [facility]pi Selection Questionnaire       Question 05/31/2022  7:46 AM EST - Filed by Patient    What is the primary reason for your visit? Other    What is the primary reason for your visit? Other    Please describe your symptoms. Tightness and pain in neck but tolerable    Have you had these symptoms before? Yes    How long have you been having these symptoms? For more than a month    Please list any medications you are currently taking for this condition.       Please describe any probable cause for these symptoms.                 Previous Responses        Op Gct Opt Outs       Question 10/22/2021  9:04 AM EST - Filed by Patient    Would you like to be excluded from the patient list? Yes    Would you like for [place] to withhold information about you from your family and friends? No    Would you like for [place] to withhold information about you from community clergy? No          Previous Medication Trials:  NSAIDS- ibuprofen  Antidepressants-   Anticonvulsant- gabapentin  Muscle relaxants- flexeril, baclofen  Topicals-voltaren gel  Short-acting opiates-percocet   Long-acting opiates-   Anxiolytics-   Other-     Previous Interventions  Medications, PT, surgery, injections with sports med  TOS decompression/PMR 11/16/21  TPI-05/10/22-very helpful.  PT-2023  Left GTB CSI-sports med 05/27/22      Allergies as of 05/31/2022 - Reviewed 05/31/2022   Allergen Reaction Noted    Latex Rash and Hives 09/16/2016    Penicillins Anaphylaxis 09/16/2016    Shellfish containing products Anaphylaxis 01/23/2017    Other Rash 12/16/2020    Latex, natural rubber Rash 08/20/2015    Penicillin Rash 02/14/2018      Current Outpatient Medications   Medication Sig Dispense Refill    albuterol 2.5 mg /3 mL (0.083 %) nebulizer solution Inhale 3 mL (2.5 mg total) by nebulization every six (6) hours as needed for wheezing or shortness of breath. 90 mL 1    azelastine (ASTELIN) 137 mcg (0.1 %) nasal spray Instill 2 sprays into each nostril two (2) times a day as needed for rhinitis. 30 mL 1    baclofen (LIORESAL) 10 MG tablet Take 0.5-1 tablets (5-10 mg total) by mouth two (2) times a day as needed for muscle spasms. 60 tablet 1    budesonide-formoteroL (SYMBICORT) 160-4.5 mcg/actuation inhaler Inhale 1 puff by mouth Two (2) times a day. 10.2 g 5    cetirizine (ZYRTEC) 10 MG tablet Take 1 tablet (10 mg total) by mouth daily. 30 tablet 12    clindamycin (CLEOCIN T) 1 % lotion 1 Application as needed.      clonazePAM (KLONOPIN) 0.5 MG tablet Take half tablet (0.25 mg total) by mouth once daily as needed for anxiety. For the next two weeks, every other day, may take an additional half a tablet as needed. After two weeks, decrease to taking half a tablet once daily as needed, without the additional dose 30 tablet 0    dextroamphetamine-amphetamine (ADDERALL) 20 mg tablet Take 1 tablet (20 mg total) by mouth daily as needed. 10 tablet 0    emtricitabine-tenofovir alafen (DESCOVY) 200-25 mg tablet Take 1 tablet by mouth daily. 90 tablet 0    famotidine (PEPCID) 40 MG tablet Take 1 tablet (40 mg total) by mouth every evening. 30 tablet 1    gabapentin (NEURONTIN) 250 mg/5 mL oral solution Take 12 mL (600 mg total) by mouth Three (3) times a day. 1080 mL 3    ibuprofen (MOTRIN) 800 MG tablet Take 1 tablet (800 mg total) by mouth every eight (8) hours as needed for pain. 60 tablet 2    inhalational spacing device Spcr Use as directed with albuterol and symbicort 1 each 1    nebulizer and compressor (COMP-AIR NEBULIZER COMPRESSOR) Devi 1 each by Miscellaneous route nightly as needed. 1 each 0    needle, disp, 25 Leonel (BD REGULAR BEVEL NEEDLES) 25 Ruhan x 5/8 Ndle For subcutaneous hormone injection. 25 each 0    nifedipine 0.3% lidocaine 1.5% in petrolatum ointment Apply a pea-size amount to anal area 3 times a day 100 g 0    omalizumab (XOLAIR) 150 mg/mL syringe Inject 2 mL (300 mg total) under the skin every twenty-eight (28) days. 2 mL 12    pantoprazole (PROTONIX) 20 MG tablet Take 1 tablet (20 mg total) by mouth daily. 30 tablet 1    prucalopride 2 mg Tab Take 1 tablet (2 mg total) by mouth daily. 90 tablet 0    safety needles (BD SAFETYGLIDE NEEDLE) 18 Jamyson x 1 1/2 Ndle For drawing hormone injection 25 each 0    syringe, disposable, (EASY TOUCH LUER LOCK SYRINGE) 1 mL Syrg Use for weekly hormone injection. 4 each 0    syringe, disposable, 2.5 mL Syrg Use weekly  30 Syringe 2    testosterone cypionate (DEPOTESTOTERONE CYPIONATE) 200 mg/mL injection Inject 0.2 mL (40 mg total) under the skin every seven (7) days. 2 mL 0    triamcinolone (KENALOG) 0.1 % cream Apply topically Two (2) times a day. 15 g 0    EPINEPHrine (EPIPEN) 0.3 mg/0.3 mL injection Inject 0.3 mL (0.3 mg total) under the skin once for 1 dose. 2 each 0     Current Facility-Administered Medications   Medication Dose Route Frequency Provider Last Rate Last Admin    omalizumab [name]) injection 300 mg  300 mg Subcutaneous Q28 Days [name], [name], [name]   300 mg at 05/24/22 1138       Imaging/Tests:     Lab Results   Component Value Date    PLT 272 11/19/2021     Lab Results   Component Value Date    CREATININE 0.87 05/26/2022     Lab Results   Component Value Date    ALKPHOS 103 05/26/2022    BILITOT 0.5 05/26/2022    PROT 7.0 05/26/2022    ALBUMIN 3.9 05/26/2022    ALT 17 05/26/2022    AST 22 05/26/2022     ROS:  See questionnaire above.       PHYSICAL EXAM:  BP 111/81  - Pulse 76  - Temp 36.8 ??C (98.2 ??F) (Skin)  - Resp 16  - Ht 162.6 cm (5' 4)  - Wt 75.3 kg (166 lb)  - LMP 07/20/2017 (Exact Date)  - SpO2 99%  - BMI 28.49 kg/m??     Wt Readings from Last 3 Encounters:   05/31/22 75.3 kg (166 lb)   05/27/22 74.8 kg (165 lb)   05/26/22 75.1 kg (165 lb 9.6 oz) GENERAL: Well developed, well-nourished male and is in no apparent distress. The patient is pleasant and interactive. Patient is a good historian.  No evidence of sedation or intoxication.    HEENT: Normocephalic/atraumatic.   CARDIOVASCULAR:  Warm, well perfused  RESPIRATORY:  Normal work of breathing, no supplemental 02  EXTREMITIES: No clubbing, cyanosis noted.  NEUROLOGIC:  Alert and oriented, speech fluent, normal language.   MUSCULOSKELETAL:  Motor function preserved as previous in upper and lower extremities.  Patient rises from a seated position with no difficulty.  The patient was able to ambulate with no difficulty throughout the clinic today without the assistance of a walking aid-None.  Left SI joint tenderness. Lumbosacral midline TTP. Anterior right shoulder TTP. Positive FABER test on the left.  SKIN: No obvious rashes, lesions, or erythema.  PSY: Appropriate, full range affect, no psychomotor retardation      Medical Decision Making    Problems Addressed:  1 or more chronic illness w/ exacerbation, progression, or side effects of treatment (moderate)-myofascial pain improved, trochanteric bursitis worse    Amount/Complexity:  Reviewed external records:  Vascular surgery return 05/19/22. Fam med 05/26/22.  SPorts med 05/27/22-Left GTB CSI. and [place] PDMP (today on day of visit)  Reviewed results: Labs 05/26/22-Creatinine and LFTs normal.  Ordered tests: None  Assessment requiring independent historian: None  Discussion of management/test results with medical professional: None  Independent interpretation of test: None    Risk  Moderate: Prescription Drug Management, Minor surgery w/ risk factors, Elective Major Surgery w/o risk factors, Diagnosis/treatment significantly limited by social determinants of health-discussed medication management and changes, no prescriptions needed today.    Documentation assistance was provided by Waldemar Dickens Scribe, on May 31, 2022 at 8:13 AM for Dr.  Valene Bors, MD.    I attest that I have reviewed the scribe note and that the components of the history of the present illness, the physical exam, and the assessment and plan documented were performed by me or were performed in my presence by the student where I verified the documentation and performed (or re-performed) the exam and medical decision making.    Heloise Beecham, MD  PGY-2/CA-1  [facility] Dept of Anesthesiology

## 2022-05-31 NOTE — Unmapped (Signed)
AVS and exercises reviewed.  Pt voiced understanding.

## 2022-05-31 NOTE — Unmapped (Signed)
Today we did the following :  -It was good to see you    -If we have prescribed you a medication, be prepared that your insurance company may require additional paperwork (even for generic medication), typically called a prior authorization.  This sometimes can delay the prescription, but know that our office is very efficient at taking care of these.  If it is significantly delayed, you may call the pharmacy for updates, or call the office.    -You are set up for trigger point injections next week, lets keep those and try to get more improvement or keep it doing well longer.   But if you feel like the muscles are doing well, feel free to cancel.    -Continue gabapentin, no refill needed.  I am happy to fill again when its due.    -Continue baclofen as needed if the spasms are worse    -Continue to limit the ibuprofen as able    Continue with Dr. Nedra Hai for now about the hip issues.  Give the injection a few more weeks to see how it does    -I do think you also have some low back and SI joint pain as well, keep the stretching going    -Follow up in February    -Please plan to arrive to all appointments at least 30 minutes prior to your scheduled appointment time. Failure to do so may delay your visit.    MyChart Messages:  Please use MyChart for non-urgent symptoms or matters such as general questions, non-urgent prescription refills, or non-urgent scheduling issues. For your safety and best  care please DO NOT use MyChart messages to report emergent or urgent symptoms as messages are only checked during regular business hours.  These messages are checked by the nurses during normal business hours 8:30 am-4:30 pm Monday-Friday every 24-48 hours and are for non-urgent, non-emergent concerns. You may be asked to return for a follow up visit if it is deemed your questions are best handled in the clinic setting.  Please call our nurse line below for more time sensitive issues. If our office is closed you can contact the hospital operator to reach the on call provider available for urgent/emergent issues only.     -Because of the high volume of calls we receive and the high demand for our clinical services, we are occupied all day providing care for patients in the clinic. This leaves little time to respond to phone calls, and we are generally unable to discuss patient care advice over the telephone. If you are experiencing a medication side effect or complication, you can call and let us know, but we will typically not make a medication substitution or change over the telephone.     We are generally unable to respond acutely to a flare up of pain, as this is quite common in our patients and needs to be dealt with as part of the long term management plan. Please make an appointment with [place] if you wish to discuss a matter in any detail. Should you still need to call, please do so at [phone].       Thank you for choosing [facility]. It was a pleasure to see you in clinic today. Please contact us with any questions or concerns at [phone].     Criss Rosales, MD    05/31/2022  Anesthesiologist and Pain Management Provider  [facility]    -If you or someone you know is having a  mental health emergency, you can dial [phone] (instead of 911), for the mental health emergency line.      What are common side effects from pain medications you may be taking?    Do not drive or do anything with responsibility until you know you are tolerating a new medication.    Many pain type medications can be sedating, especially in combination with others.  They can also decrease your breathing especially in combination with other medications.  If a medication is not effective for you, talk to [place] about weaning off (do not stop abruptly on your own), so we do not just continue it.    Only take medications as prescribed.  If a medication is prescribed as needed, it means you can use it as you need it and do not have to take it every day. Do not ever take more than what is prescribed to you for any medication.  Not only could this be dangerous, but if you do not take it as prescribed we may have to discontinue it due to safety concerns.  If you feel like your medications are not controlling your medications, do not adjust the medication on your own.  Please discuss at your next clinic visit and we can determine a plan.    Opioids/Narcotics (Examples: oxycodone, hydrocodone, tramadol, morphine, hydromorphone, fentanyl patch)  Narcan is now available Over the Counter (OTC), meaning you can pick it up at many pharmacies without a prescription.  The cost is around $45.    ?? Constipation  ?? Itching  ?? Nausea  ?? Dizziness or lightheadedness  ?? Drowsiness or sedation  ?? Decreased breathing   ?? Addiction     Anticonvulsants (Examples: Gabapentin, Pregabalin, Carbamazepine, topiramate)  New warning in 2019 that these medications in combination with opioids can cause respiratory depression.  ?? Dizziness or lightheadedness  ?? Drowsiness or sedation  ?? Nausea  ?? Leg swelling     Antidepressants (Examples: duloxetine, amitriptyline, nortriptyline, venlafaxine)  ?? Dizziness or lightheadedness  ?? Drowsiness or sedation  ?? Constipation  ?? Urinary retention  ?? Dry mouth  ?? Nausea  ?? Difficulty falling asleep     NSAIDS (Examples: Ibuprofen, naproxen, meloxicam, celecoxib)  -The FDA put out guidelines in 2015 that NSAIDs should not be used regularly due to safety concerns.  OK to use a few times a month, or for 1-2 weeks for a flare of pain.  If you take NSAIDs everyday, we should discuss this and stop it due to safety.  ?? Bleeding  ?? Stomach ulcers  ?? Kidney problems   -Heart Attack  -Stroke     Muscle relaxants (Examples: cyclobenzaprine, tizanidine, baclofen)  ?? Dizziness or lightheadedness  ?? Drowsiness or sedation  ?? Nausea  ?? Low blood pressure  ?? Dry mouth     Many pain medications can lead to a rare, potentially dangerous condition called serotonin syndrome. Symptoms can include tremors, agitation, fever, muscle rigidity/contractions, diarrhea, excessive sweating, palpitations and high blood pressure. If these symptoms arise, please contact us or report to the emergency room for further management.      Your pharmacist can answer any questions you may have in regard to all your medications or possible interactions.

## 2022-06-02 NOTE — Unmapped (Signed)
1st attempt - LVM with call back number.  Following up on referral to [facility] for surgical consult.  Will call back at a later time.

## 2022-06-06 ENCOUNTER — Ambulatory Visit: Admit: 2022-06-06 | Discharge: 2022-06-07 | Payer: PRIVATE HEALTH INSURANCE | Attending: Urology | Primary: Urology

## 2022-06-06 NOTE — Unmapped (Signed)
ASSESSMENT:  Gender dysphoria here for evaluation and bottom surgery discussion    DISCUSSION:    We discussed gender affirming bottom surgery in detail, including vaginectomy, scrotoplasty, urethral lengthening, metoidioplasty, phalloplasty. We discussed differences between metoidioplasty and phalloplasty, and various donor sites for phalloplasty.    We discussed risks, benefits and alternatives to surgery, including:  General risks of Vincent procedure such as heart attack, stroke, pneumonia, blood clots, pulmonary embolus, and others.   Risks specific to Vincent procedure such as bleeding, tissue necrosis, wound dehiscence, poor cosmesis, pelvic pain, stricture, fistula, post-void dribbling, urinary tract infections, weak, splayed and non-directable urine stream, inability to orgasm or change in orgasm, pain/scarring, injury to surrounding tissue (including bowel, rectum, bladder).  Risks related to lithotomy positioning, including lower extremity paresthesias or pain (Vincent vast majority of which would resolve in 24 hours), compartment syndrome (requiring emergency surgery to decompress), and rhabdomyolysis.    We discussed fertility preservation, and a referral was made if requested.    We discussed Vincent importance of complete hair removal in Vincent urethral portion of a phalloplasty.    We discussed Vincent importance of communication post-operatively, best methods for reaching out to [place] [facility] and Department of Urology are available via phone for urgent issues or MyChart for less urgent issues), and Vincent need to come in for unscheduled check-ups post-operatively as needed. It may be necessary to remain locally for 2 weeks post-operatively.    PLAN:  Leaning toward meta, but wants to discuss phallo with Dr. Marita Kansas  If he does wish to move forward with meta, we can do so    ----    REFERRING PROVIDER: Soyla Murphy, MD    HISTORY OF PRESENT ILLNESS:     A 41 y.o.-year-old transgender adult seen in consultation at Vincent request of Vincent above provider for evaluation and bottom surgery discussion.    Assigned male at birth  Pronouns: he/him  Living full time in current gender role since: 6 years  On gender affirming hormones since: 6-7 years    Hysterectomy: Yes    Any tobacco use previous or current: No  IDU previous or current: No  BMI: Estimated body mass index is 28.67 kg/m?? as calculated from Vincent following:    Height as of this encounter: 162.6 cm (5' 4).    Weight as of this encounter: 75.8 kg (167 lb).    PMH: Healthy  PSH: top, TAH, gallbladder, TOS decompression  Family or personal history of bleeding or clotting disorders, DVT or PE: No    Occupation: Has his own business running vending machines  Hair removal: No  Issues with urination (frequency/urgency/incontinence/dribbling): No  Support system: mother, father, sister, trans ex girlfriend, friends  Genital injury, surgery, UTIs, hematuria: No  Children: No  Interest in referral for fertility preservation:  N/A    Surgical interest: Phallo or meta    Sexual history  Able to Orgasm: Yes  Libido: Good  Partner with men, women, both: Both  Currently partnered: No  Receptive vaginal intercourse: Yes but hurts since hysterectomy, doesn't care for it/have a need for it so stopped      I review history elements and review of systems on new patient intake form.    PAST MEDICAL HISTORY:   Past Medical History:   Diagnosis Date    Anxiety     Asthma     Autoimmune autonomic neuropathy     Breast cyst     Financial difficulties  Lack of access to transportation     Migraines     Neuromuscular disorder (CMS-HCC)     Scleroderma (CMS-HCC)     Visual impairment        PAST SURGICAL HISTORY:   Past Surgical History:   Procedure Laterality Date    CHOLECYSTECTOMY  2012    HYSTERECTOMY Bilateral     08/2017    MASTECTOMY      04/2018    OOPHORECTOMY  2005    laparotomy    PR CYSTOURETHROSCOPY N/A 08/30/2017    Procedure: CYSTOURETHROSCOPY (SEPARATE PROCEDURE); Surgeon: Forbes Cellar, MD;  [facility];  Service: Advanced Laparoscopy    PR EXCISION TURBINATE,SUBMUCOUS Bilateral 01/04/2021    Procedure: SUBMUCOUS RESECTION INFERIOR TURBINATE, PARTIAL OR COMPLETE, ANY METHOD;  Surgeon: Adam Swaziland Kimple, MD;  Location: [facility];  Service: ENT    PR INCISE TENDON/MUSCLE,SHLDR,SINGLE Right 11/16/2021    Procedure: RIGHT PEC MINOR RELEASE;  Surgeon: Thora Lance, MD;  Location: MAIN OR [facility];  Service: Vascular    PR LAPAROSCOPY W TOT HYSTERECTUTERUS <=250 GRAM  W TUBE/OVARY N/A 08/30/2017    Procedure: LAPAROSCOPY, SURGICAL, WITH TOTAL HYSTERECTOMY, FOR UTERUS 250 G OR LESS; W/REMOVAL TUBE(S) AND/OR OVARY(S);  Surgeon: Forbes Cellar, MD;  Location: [facility] OR [facility];  Service: Advanced Laparoscopy    PR MASTECTOMY, SIMPLE, COMPLETE Bilateral 01/09/2020    Procedure: MASTECTOMY, SIMPLE, COMPLETE;  Surgeon: Carola Rhine, MD;  Location: MAIN OR [place];  Service: Plastics    PR NEUROPLASTY BRACHIAL PLEXUS,OPEN Right 11/16/2021    Procedure: RIGHT TOS DECOMPRESSION;  Surgeon: Thora Lance, MD;  Location: MAIN OR [place];  Service: Vascular    PR REPAIR OF NASAL SEPTUM N/A 01/04/2021    Procedure: SEPTOPLASTY OR SUBMUCOUS RESECTION, WITH OR WITHOUT CARTILAGE SCORING, CONTOURING OR REPLACEMENT WITH GRAFT;  Surgeon: Adam Swaziland Kimple, MD;  Location: [facility] OR [facility];  Service: ENT    PR UPPER GI ENDOSCOPY,BIOPSY N/A 08/13/2019    Procedure: UGI ENDOSCOPY; WITH BIOPSY, SINGLE OR MULTIPLE;  Surgeon: Alfred Levins, MD;  [facility]: [facility];  Service: Gastroenterology    SINUS SURGERY         MEDICATIONS:   Current Outpatient Medications   Medication Sig Dispense Refill    albuterol 2.5 mg /3 mL (0.083 %) nebulizer solution Inhale 3 mL (2.5 mg total) by nebulization every six (6) hours as needed for wheezing or shortness of breath. 90 mL 1    azelastine (ASTELIN) 137 mcg (0.1 %) nasal spray Instill 2 sprays into each nostril two (2) times a day as needed for rhinitis. 30 mL 1    budesonide-formoteroL (SYMBICORT) 160-4.5 mcg/actuation inhaler Inhale 1 puff by mouth Two (2) times a day. 10.2 g 5    clonazePAM (KLONOPIN) 0.5 MG tablet Take half tablet (0.25 mg total) by mouth once daily as needed for anxiety. For Vincent next two weeks, every other day, may take an additional half a tablet as needed. After two weeks, decrease to taking half a tablet once daily as needed, without Vincent additional dose 30 tablet 0    dextroamphetamine-amphetamine (ADDERALL) 20 mg tablet Take 1 tablet (20 mg total) by mouth daily as needed. 10 tablet 0    emtricitabine-tenofovir alafen (DESCOVY) 200-25 mg tablet Take 1 tablet by mouth daily. 90 tablet 0    gabapentin (NEURONTIN) 250 mg/5 mL oral solution Take 12 mL (600 mg total) by mouth Three (3) times a day. 1080 mL 3  omalizumab (XOLAIR) 150 mg/mL syringe Inject 2 mL (300 mg total) under Vincent skin every twenty-eight (28) days. 2 mL 12    pantoprazole (PROTONIX) 20 MG tablet Take 1 tablet (20 mg total) by mouth daily. 30 tablet 1    testosterone cypionate (DEPOTESTOTERONE CYPIONATE) 200 mg/mL injection Inject 0.2 mL (40 mg total) under Vincent skin every seven (7) days. 2 mL 0    baclofen (LIORESAL) 10 MG tablet Take 0.5-1 tablets (5-10 mg total) by mouth two (2) times a day as needed for muscle spasms. (Patient not taking: Reported on 06/06/2022) 60 tablet 1    cetirizine (ZYRTEC) 10 MG tablet Take 1 tablet (10 mg total) by mouth daily. (Patient not taking: Reported on 06/06/2022) 30 tablet 12    clindamycin (CLEOCIN T) 1 % lotion 1 Application as needed. (Patient not taking: Reported on 06/06/2022)      EPINEPHrine (EPIPEN) 0.3 mg/0.3 mL injection Inject 0.3 mL (0.3 mg total) under Vincent skin once for 1 dose. 2 each 0    famotidine (PEPCID) 40 MG tablet Take 1 tablet (40 mg total) by mouth every evening. (Patient not taking: Reported on 06/06/2022) 30 tablet 1    ibuprofen (MOTRIN) 800 MG tablet Take 1 tablet (800 mg total) by mouth every eight (8) hours as needed for pain. (Patient not taking: Reported on 06/06/2022) 60 tablet 2    inhalational spacing device Spcr Use as directed with albuterol and symbicort (Patient not taking: Reported on 06/06/2022) 1 each 1    nebulizer and compressor (COMP-AIR NEBULIZER COMPRESSOR) Devi 1 each by Miscellaneous route nightly as needed. (Patient not taking: Reported on 06/06/2022) 1 each 0    needle, disp, 25 Pharrell (BD REGULAR BEVEL NEEDLES) 25 Jerico x 5/8 Ndle For subcutaneous hormone injection. (Patient not taking: Reported on 06/06/2022) 25 each 0    nifedipine 0.3% lidocaine 1.5% in petrolatum ointment Apply a pea-size amount to anal area 3 times a day (Patient not taking: Reported on 06/06/2022) 100 g 0    prucalopride 2 mg Tab Take 1 tablet (2 mg total) by mouth daily. (Patient not taking: Reported on 06/06/2022) 90 tablet 0    safety needles (BD SAFETYGLIDE NEEDLE) 18 Rishan x 1 1/2 Ndle For drawing hormone injection (Patient not taking: Reported on 06/06/2022) 25 each 0    syringe, disposable, (EASY TOUCH LUER LOCK SYRINGE) 1 mL Syrg Use for weekly hormone injection. (Patient not taking: Reported on 06/06/2022) 4 each 0    syringe, disposable, 2.5 mL Syrg Use weekly (Patient not taking: Reported on 06/06/2022) 30 Syringe 2    triamcinolone (KENALOG) 0.1 % cream Apply topically Two (2) times a day. (Patient not taking: Reported on 06/06/2022) 15 g 0     Current Facility-Administered Medications   Medication Dose Route Frequency Provider Last Rate Last Admin    omalizumab [name]) injection 300 mg  300 mg Subcutaneous Q28 Days [name], [name], [name]   300 mg at 05/24/22 1138       ALLERGIES:   Allergies   Allergen Reactions    Latex Rash and Hives    Penicillins Anaphylaxis     Pt can tolerate amoxicillin   Has patient had a PCN reaction causing immediate rash, facial/tongue/throat swelling, SOB or lightheadedness with hypotension: no   Has patient had a PCN reaction causing severe rash involving mucus membranes or skin necrosis: no  Has patient had a PCN reaction that required hospitalization: unknown  Has patient had a PCN reaction occurring within Vincent  last 10 years: no  If all of Vincent above answers are NO, then may proceed with Cephalosporin use.    Shellfish Containing Products Anaphylaxis    Other Rash     Nifedipine 0.3%, Lidocaine 1.5% in petrolatum ointment caused rash on his Bottom    Latex, Natural Rubber Rash    Penicillin Rash       FAMILY HISTORY:   Family History   Problem Relation Age of Onset    Cancer Mother         breast    Thyroid disease Mother     Hypertension Father     Breast cancer Sister     Cancer Sister         breast    Ovarian cancer Maternal Grandmother     Breast cancer Maternal Grandmother     Cataracts Maternal Grandmother        SOCIAL HISTORY:   Social History     Socioeconomic History    Marital status: Single     Spouse name: Waters    Number of children: Waters    Years of education: Waters    Highest education level: Waters   Tobacco Use    Smoking status: Former     Types: Cigarettes    Smokeless tobacco: Never   Vaping Use    Vaping Use: Never used   Substance and Sexual Activity    Alcohol use: Not Currently    Drug use: Yes     Types: Marijuana     Comment: occasionaly    Sexual activity: Not Currently     Partners: Male   Social History Narrative    ** Merged History Encounter **          Social Determinants of Health     Financial Resource Strain: Unknown (05/26/2022)    Overall Financial Resource Strain (CARDIA)     Difficulty of Paying Living Expenses: Patient refused   Food Insecurity: Unknown (05/26/2022)    Hunger Vital Sign     Worried About Running Out of Food in Vincent Last Year: Patient refused     Ran Out of Food in Vincent Last Year: Patient refused   Transportation Needs: Unknown (05/26/2022)    PRAPARE - Therapist, art (Medical): Patient refused     Lack of Transportation (Non-Medical): No   Physical Activity: Unknown (07/27/2021)    Exercise Vital Sign     Days of Exercise per Week: 1 day   Stress: Stress Concern Present (07/27/2021)    [employer] of Occupational Health - Occupational Stress Questionnaire     Feeling of Stress : To some extent   Social Connections: Moderately Isolated (07/27/2021)    Social Connection and Isolation Panel [NHANES]     Frequency of Communication with Friends and Family: More than three times a week     Frequency of Social Gatherings with Friends and Family: More than three times a week     Attends Religious Services: More than 4 times per year     Active Member of [employer] or Organizations: No     Attends Banker Meetings: Never     Marital Status: Never married       REVIEW OF SYSTEMS:   10-system review of systems negative other than what is mentioned above.  Vincent patient was asked to review all abnormal responses not pertinent to today's visit with their primary care physician.    PHYSICAL EXAM:  GENERAL: Pleasant adult in no acute distress.   VITAL SIGNS: Blood pressure 133/83, pulse 101, temperature 37.2 ??C (99 ??F), temperature source Temporal, height 162.6 cm (5' 4), weight 75.8 kg (167 lb), last menstrual period 07/20/2017, not currently breastfeeding.  Estimated body mass index is 28.67 kg/m?? as calculated from Vincent following:    Height as of this encounter: 162.6 cm (5' 4).    Weight as of this encounter: 75.8 kg (167 lb).   HEENT: Normocephalic, atraumatic, extraocular muscles intact  NECK: Supple, no lymphadenopathy  CARDIOVASCULAR: No peripheral edema  PULMONARY: Normal work of breathing, no use of accessory muscles  ABDOMEN: Soft, non-tender, non-distended. No organomegaly or hernias.  BACK: No costovertebral angle tenderness, no spiny bone tenderness.   EXTREMITIES: No clubbing, cyanosis or edema.   NEUROLOGIC:  Cranial nerves II-XII grossly intact  PSYCHOLOGIC: Normal affect, normal mood  SKIN: Warm and dry. No lesions.  GU:  Androgenized phallus  Labia minora reach glans  Demonstrated SPL    LAB RESULTS:   Results for orders placed or performed in visit on 05/31/22   Chlamydia/Gonorrhoeae NAA    Specimen: Throat; Swab   Result Value Ref Range    Chlamydia trachomatis, NAA Negative Negative    Gonorrhoeae NAA Negative Negative    CT/GC Specimen Type Swab     CT/GC Specimen Source Throat    Chlamydia/Gonorrhoeae NAA    Specimen: Patient-collected Vaginal Swab   Result Value Ref Range    Chlamydia trachomatis, NAA Negative Negative    Gonorrhoeae NAA Negative Negative    CT/GC Specimen Type Swab     CT/GC Specimen Source Patient-collected Vaginal Swab    Test of Cure GC Culture    Specimen: Vagina; Genital   Result Value Ref Range    Test of Cure Gonorrhoeae Culture NOT DETECTED    Test of Cure GC Culture    Specimen: Throat   Result Value Ref Range    Test of Cure Gonorrhoeae Culture NOT DETECTED      *Note: Due to a large number of results and/or encounters for Vincent requested time period, some results have not been displayed. A complete set of results can be found in Results Review.     Ordered at this visit: No orders of Vincent defined types were placed in this encounter.      No results found for: PSASCRN, PSADIAG    Lab Results   Component Value Date    WBC 5.2 11/19/2021    HGB 12.9 11/19/2021    HCT 44.6 05/26/2022    PLT 272 11/19/2021       Lab Results   Component Value Date    NA 140 05/26/2022    K 4.8 05/26/2022    CL 104 05/26/2022    CO2 30.0 05/26/2022    BUN 14 05/26/2022    CREATININE 0.87 05/26/2022    GLU 89 05/26/2022    CALCIUM 9.8 05/26/2022    MG 1.9 11/19/2021    PHOS 3.9 11/19/2021       Lab Results   Component Value Date    BILITOT 0.5 05/26/2022    PROT 7.0 05/26/2022    ALBUMIN 3.9 05/26/2022    ALT 17 05/26/2022    AST 22 05/26/2022    ALKPHOS 103 05/26/2022       No results found for: LABPROT, INR, APTT

## 2022-06-06 NOTE — Unmapped (Signed)
Hello Vincent Waters     Thank you for consulting with [facility] and the [facility] for metoidioplasty and phalloplasty.    As you consider your surgical planning, keep in mind the following things that will need completion or follow-up prior to scheduling a surgical date:  -  update Letters of Support        If you have any questions, please contact the Transgender Health Program at:    [name]  [email or web address]  Phone: [phone] (option 3)  Fax: [phone]

## 2022-06-06 NOTE — Unmapped (Signed)
PHALLOPLASTY CLINIC VISIT    HPI:  Vincent Waters is a 41 y.o. adult transmale who is here today at the request of Dr. Laurance Flatten to evaluate for gender-affirming phalloplasty      [facility] is a right-handed healthy gentleman with history of left TOS, and anxiety.     He has transitioned into a male role in the past 7years and lives with 2 roommates.     He has been on hormonal treatment for the past 9yrs.    He underwent masculinizing breast surgery in 2019 and had a revision mastectomy in 2021     He underwent laparoscopic hysterectomy and BSO in 2019.    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, adult     He is right hand dominant and is fine with left hand as donor site     Surgery -  No history of hand injury/surgery, TOS surgery on right side  No history of  groin injury/sugery  No history of lower extremity injury or surgery       Expectations- Would like to achieve sensate penis, urinate standing ?sexual intercourse     Smoking- does not smoke cigarettes, a blunt a month     Meds - takes testosterone, clonazepin, gabapentin     Social - lives with roommates, but his sister, mom and dad are all in the area, supportive and able to help     Weighs 75.8kg for BMI 29 today     He is yet to obtain a WPATH letter supporting his candidacy for gender affirmation phalloplasty    Past Medical History:  Past Medical History:   Diagnosis Date    Anxiety     Asthma     Autoimmune autonomic neuropathy     Breast cyst     Financial difficulties     Lack of access to transportation     Migraines     Neuromuscular disorder (CMS-HCC)     Scleroderma (CMS-HCC)     Visual impairment        Past Surgical History:  Past Surgical History:   Procedure Laterality Date    CHOLECYSTECTOMY  2012    HYSTERECTOMY Bilateral     08/2017    MASTECTOMY      04/2018    OOPHORECTOMY  2005    laparotomy    PR CYSTOURETHROSCOPY N/A 08/30/2017    Procedure: CYSTOURETHROSCOPY (SEPARATE PROCEDURE);  Surgeon: Forbes Cellar, MD;  Location: [facility] OR [facility];  Service: Advanced Laparoscopy    PR EXCISION TURBINATE,SUBMUCOUS Bilateral 01/04/2021    Procedure: SUBMUCOUS RESECTION INFERIOR TURBINATE, PARTIAL OR COMPLETE, ANY METHOD;  Surgeon: Adam Swaziland Kimple, MD;  Location: [facility] OR [facility];  Service: ENT    PR INCISE TENDON/MUSCLE,SHLDR,SINGLE Right 11/16/2021    Procedure: RIGHT PEC MINOR RELEASE;  Surgeon: Thora Lance, MD;  Location: MAIN OR [place];  Service: Vascular    PR LAPAROSCOPY W TOT HYSTERECTUTERUS <=250 GRAM  W TUBE/OVARY N/A 08/30/2017    Procedure: LAPAROSCOPY, SURGICAL, WITH TOTAL HYSTERECTOMY, FOR UTERUS 250 G OR LESS; W/REMOVAL TUBE(S) AND/OR OVARY(S);  Surgeon: Forbes Cellar, MD;  Location: [facility] OR [facility];  Service: Advanced Laparoscopy    PR MASTECTOMY, SIMPLE, COMPLETE Bilateral 01/09/2020    Procedure: MASTECTOMY, SIMPLE, COMPLETE;  Surgeon: Carola Rhine, MD;  Location: MAIN OR [facility];  Service: Plastics    PR NEUROPLASTY BRACHIAL PLEXUS,OPEN Right 11/16/2021    Procedure: RIGHT TOS DECOMPRESSION;  Surgeon: Thora Lance, MD;  Location: MAIN  OR [place];  Service: Vascular    PR REPAIR OF NASAL SEPTUM N/A 01/04/2021    Procedure: SEPTOPLASTY OR SUBMUCOUS RESECTION, WITH OR WITHOUT CARTILAGE SCORING, CONTOURING OR REPLACEMENT WITH GRAFT;  Surgeon: Adam Swaziland Kimple, MD;  Location: [facility] OR [facility];  Service: ENT    PR UPPER GI ENDOSCOPY,BIOPSY N/A 08/13/2019    Procedure: UGI ENDOSCOPY; WITH BIOPSY, SINGLE OR MULTIPLE;  Surgeon: Alfred Levins, MD;  [facility]: [facility];  Service: Gastroenterology    SINUS SURGERY         Medications:  Current Outpatient Medications   Medication Sig Dispense Refill    albuterol 2.5 mg /3 mL (0.083 %) nebulizer solution Inhale 3 mL (2.5 mg total) by nebulization every six (6) hours as needed for wheezing or shortness of breath. 90 mL 1    azelastine (ASTELIN) 137 mcg (0.1 %) nasal spray Instill 2 sprays into each nostril two (2) times a day as needed for rhinitis. 30 mL 1    baclofen (LIORESAL) 10 MG tablet Take 0.5-1 tablets (5-10 mg total) by mouth two (2) times a day as needed for muscle spasms. (Patient not taking: Reported on 06/06/2022) 60 tablet 1    budesonide-formoteroL (SYMBICORT) 160-4.5 mcg/actuation inhaler Inhale 1 puff by mouth Two (2) times a day. 10.2 g 5    cetirizine (ZYRTEC) 10 MG tablet Take 1 tablet (10 mg total) by mouth daily. (Patient not taking: Reported on 06/06/2022) 30 tablet 12    clindamycin (CLEOCIN T) 1 % lotion 1 Application as needed. (Patient not taking: Reported on 06/06/2022)      clonazePAM (KLONOPIN) 0.5 MG tablet Take half tablet (0.25 mg total) by mouth once daily as needed for anxiety. For the next two weeks, every other day, may take an additional half a tablet as needed. After two weeks, decrease to taking half a tablet once daily as needed, without the additional dose 30 tablet 0    dextroamphetamine-amphetamine (ADDERALL) 20 mg tablet Take 1 tablet (20 mg total) by mouth daily as needed. 10 tablet 0    emtricitabine-tenofovir alafen (DESCOVY) 200-25 mg tablet Take 1 tablet by mouth daily. 90 tablet 0    EPINEPHrine (EPIPEN) 0.3 mg/0.3 mL injection Inject 0.3 mL (0.3 mg total) under the skin once for 1 dose. 2 each 0    famotidine (PEPCID) 40 MG tablet Take 1 tablet (40 mg total) by mouth every evening. (Patient not taking: Reported on 06/06/2022) 30 tablet 1    gabapentin (NEURONTIN) 250 mg/5 mL oral solution Take 12 mL (600 mg total) by mouth Three (3) times a day. 1080 mL 3    ibuprofen (MOTRIN) 800 MG tablet Take 1 tablet (800 mg total) by mouth every eight (8) hours as needed for pain. (Patient not taking: Reported on 06/06/2022) 60 tablet 2    inhalational spacing device Spcr Use as directed with albuterol and symbicort (Patient not taking: Reported on 06/06/2022) 1 each 1    nebulizer and compressor (COMP-AIR NEBULIZER COMPRESSOR) Devi 1 each by Miscellaneous route nightly as needed. (Patient not taking: Reported on 06/06/2022) 1 each 0    needle, disp, 25 Tannar (BD REGULAR BEVEL NEEDLES) 25 Williom x 5/8 Ndle For subcutaneous hormone injection. (Patient not taking: Reported on 06/06/2022) 25 each 0    nifedipine 0.3% lidocaine 1.5% in petrolatum ointment Apply a pea-size amount to anal area 3 times a day (Patient not taking: Reported on 06/06/2022) 100 g 0    omalizumab (XOLAIR) 150 mg/mL  syringe Inject 2 mL (300 mg total) under the skin every twenty-eight (28) days. 2 mL 12    pantoprazole (PROTONIX) 20 MG tablet Take 1 tablet (20 mg total) by mouth daily. 30 tablet 1    prucalopride 2 mg Tab Take 1 tablet (2 mg total) by mouth daily. (Patient not taking: Reported on 06/06/2022) 90 tablet 0    safety needles (BD SAFETYGLIDE NEEDLE) 18 Abdulla x 1 1/2 Ndle For drawing hormone injection (Patient not taking: Reported on 06/06/2022) 25 each 0    syringe, disposable, (EASY TOUCH LUER LOCK SYRINGE) 1 mL Syrg Use for weekly hormone injection. (Patient not taking: Reported on 06/06/2022) 4 each 0    syringe, disposable, 2.5 mL Syrg Use weekly (Patient not taking: Reported on 06/06/2022) 30 Syringe 2    testosterone cypionate (DEPOTESTOTERONE CYPIONATE) 200 mg/mL injection Inject 0.2 mL (40 mg total) under the skin every seven (7) days. 2 mL 0    triamcinolone (KENALOG) 0.1 % cream Apply topically Two (2) times a day. (Patient not taking: Reported on 06/06/2022) 15 g 0     Current Facility-Administered Medications   Medication Dose Route Frequency Provider Last Rate Last Admin    omalizumab [name]) injection 300 mg  300 mg Subcutaneous Q28 Days [name], [name], [name]   300 mg at 05/24/22 1138       Allergies:  Allergies   Allergen Reactions    Latex Rash and Hives    Penicillins Anaphylaxis     Pt can tolerate amoxicillin   Has patient had a PCN reaction causing immediate rash, facial/tongue/throat swelling, SOB or lightheadedness with hypotension: no   Has patient had a PCN reaction causing severe rash involving mucus membranes or skin necrosis: no  Has patient had a PCN reaction that required hospitalization: unknown  Has patient had a PCN reaction occurring within the last 10 years: no  If all of the above answers are NO, then may proceed with Cephalosporin use.    Shellfish Containing Products Anaphylaxis    Other Rash     Nifedipine 0.3%, Lidocaine 1.5% in petrolatum ointment caused rash on his Bottom    Latex, Natural Rubber Rash    Penicillin Rash       Family History:   Family History   Problem Relation Age of Onset    Cancer Mother         breast    Thyroid disease Mother     Hypertension Father     Breast cancer Sister     Cancer Sister         breast    Ovarian cancer Maternal Grandmother     Breast cancer Maternal Grandmother     Cataracts Maternal Grandmother     The patient reports no family history for bleeding disorders or anesthetic problems.    Social History:  Social History     Socioeconomic History    Marital status: Single   Tobacco Use    Smoking status: Former     Types: Cigarettes    Smokeless tobacco: Never   Haematologist Use: Never used   Substance and Sexual Activity    Alcohol use: Not Currently    Drug use: Yes     Types: Marijuana     Comment: occasionaly    Sexual activity: Not Currently     Partners: Male   Social History Narrative    ** Merged History Encounter **  Social Determinants of Health     Financial Resource Strain: Unknown (05/26/2022)    Overall Financial Resource Strain (CARDIA)     Difficulty of Paying Living Expenses: Patient refused   Food Insecurity: Unknown (05/26/2022)    Hunger Vital Sign     Worried About Running Out of Food in the Last Year: Patient refused     Ran Out of Food in the Last Year: Patient refused   Transportation Needs: Unknown (05/26/2022)    PRAPARE - Therapist, art (Medical): Patient refused     Lack of Transportation (Non-Medical): No   Physical Activity: Unknown (07/27/2021)    Exercise Vital Sign     Days of Exercise per Week: 1 day   Stress: Stress Concern Present (07/27/2021)    [employer] of Occupational Health - Occupational Stress Questionnaire     Feeling of Stress : To some extent   Social Connections: Moderately Isolated (07/27/2021)    Social Connection and Isolation Panel [NHANES]     Frequency of Communication with Friends and Family: More than three times a week     Frequency of Social Gatherings with Friends and Family: More than three times a week     Attends Religious Services: More than 4 times per year     Active Member of [employer] or Organizations: No     Attends Banker Meetings: Never     Marital Status: Never married       ROS: 12 point review of systems was completed and is negative except as per HPI.       Vitals: There were no vitals filed for this visit as it was a video visit  ?  Physical Exam:   General appearance - alert, well appearing, and in no distress, oriented to person, place, and time, and well hydrated  Mental status - alert, oriented to person, place, and time, normal mood, behavior, speech, dress, motor activity, and thought processes  Skin - normal coloration and turgor, no rashes, no suspicious skin lesions noted    EXTREMITY-   Right UE  Minimally- hirsute  Allens test negative  No scars, tattoos present  Sensation intact in all distributions  Full ROM, Power is 5     Left UE  Minimally- hirsute  Allens-test negative. No scars, tattoos present  Sensation intact in all distributions  Full ROM, Power is 5     ABDOMEN-   Full, soft, non-tender, no palpable mass  Tattoos on lateral abdomen  Well-healed Pfannensteil scars, well-healed port site scars    PELVIS   -male genitalia and male pattern hair distribution  - no groin scars noted  -Femoral pulses equal and palpable       ?  Impression and Plan:  [facility] is a 41 y.o. trangender male here today  to evaluate for FTM phalloplasty    Plan  We reviewed his medical, surgical, psychiatric and gender history  We discussed the goals of phalloplasty and reaffirmed that he is interested in 2 of 3 goals  We discussed the perioperative details of the procedure, including indications, benefits, alternatives, risks and possible complications. We also discussed the multiple stages of genital reconstruction starting with a left radial forearm free flap phalloplasty and then subsequent vaginectomy, scrotoplasty and urethral anastomoses    In the meantime, he is to lose weight to achieve a BMI of 25 or less.  He will also need two WPATH letters of support.  We  explained that these goals will reduce his risk of complications.     All his questions were answered        No follow-ups on file.

## 2022-06-06 NOTE — Unmapped (Signed)
Vincent Waters is a transgender male ,AGE@ , who uses he/him/his pronouns, and is here for consult for phalloplasty.     Corry L. Sosinski and I discussed the following care items:    Surgical Goals: Jacquise L. Meister endorsed the following goals of surgery:  Has wanted a penis since childhood  Not interested in standing to pee  Not interested in penetrating      Sexual Considerations: Guadalupe L. Deerman and I discussed sexual function considerations.  Gervis L. Boike:   identifies as pansexual  has no preference sexual partners  Keiffer L. Tworek does not need assistance with fertility preservation.  Hysterectomy? Yes.  2019      Pre-Op:    Allergies, Medical Hx, Surgical Hx, Smoking and Nicotine History, Weight and BMI, Current Medications:  See chart documentation for full details.    Concerns present: none    Risks of Surgery: The specifics of surgical risks for phalloplasty were explained in-depth, and those points were related in long form in the patient instructions for Silvestre L. Burchill's visit.  The following risks were discussed:  Flap Loss  Urinary Stricture   Poor wound healing    Staging: Staging for the procedure was discussed in depth. The patient was counseled on the need for multiple stages for this procedure, and that some variability for those stages is possible at this time. Currently, the planned surgery is separated into the following stages:  1) Flap creation and attachment  2) perineal masculinization    Post-Op: The specifics of the post-op process for phalloplasty were explained in-depth, and those points were related in long form in the patient instructions for Sara L. Bores's visit.    Surgical Discussion: metoidioplasty vs phalloplasty     Planning:    Insurance: Sammie L. Degan will need to provide the required [facility] standard letters for insurance pre-determination. Pre-determination will begin once surgery has been scheduled and letters have been received. Tavin L. Reith indicated that assistance is not needed in obtaining documentation.  Documentation needed includes two letters from mental health practitioners, letter from hormone therapy prescriber, and greater than or equal to 1 year of living in a gender role congruent with patient's gender identity  has  none  needs one letter from a mental health practitioner and one letter from hormone therapy prescriber  Patient endorses understanding of WPATH standards, no follow up required.  Current Letters of Support:  MHP:  MHP:  HRTP:    Hair removal: Fremon L. Crooker was advised to complete hair removal pre-surgery.   does not need assistance finding a referral   does not assistance filing for insurance reimbursement for pre-surgical hair removal.   was counseled that it was likely that any insurance coverage for hair removal would be completed as reimbursement.      Return to Work: Ireoluwa L. Migues was advised that 2-3 months was common for this procedure and being out of work post-phalloplasty.   Type of work: Owns vending machines   does require lifting or strenuous physical activity at work.  I advised Takashi L. Mcginnis that determinations on return to work will be made case by case by the post-op team.    Social Support: My L. Scheller does have social supports, including family.  indicates family can provide immediate post-discharge care.  indicates family can provide transport to and from post-op appointments.  Post Op Lodging: Patient is considered Local to the Surgical Area (lives within three hours) and will not need  to stay in the area after surgery.     Who to Contact: Revanth L. Seibert was advised to contact the [facility] and given contact information for all needs in the patient instructions for this visit.        Plan: pt to decide on surgery and update and return LOS    Referrals: none    Follow Up:  none     Time spent with Demani L. Schulte was approximately 30 minutes.    ---------------      Pre-Op Nursing Checklist     o Safe home environment, including:   o Consistent electrical power   o Heat and/or air conditioning for temperature regulation   o Clean, accessible running water   o Private space to perform post-op home care, such as dilation   o A safe storage place for medications and home medical care supplies   o The ability to clean clothes, towels and bedsheets   o The ability to consistently communicate with an outside caregiver, by phone or uninterrupted internet access   o Appropriate access to food, especially protein, to help promote wound healing   o Sanitary needs, including:   o Clean shower facilities   o Access to basic hygiene materials, such as antibacterial soaps   Meets Criteria: Yes.    o Social support   o An adult with a legal driver???s license who can drive you to post-op appointments and help you with basic errands in the post-op time period   o A caregiver inside or outside your home who can:   o Check in on you in person twice a day during your first month post-op   o Check in on you by phone or internet once daily for months 2-3   o Be on standby to assist in the case of an emergency   o Childcare if applicable, while you are admitted and for the first week post-operatively.   o Pet care if applicable, while you are admitted and for the first week post-operatively.   Meets Criteria: Yes.    Those who live more than three hours from [facility] will also need the following:   o Access to lodging within ten miles of [facility] in [place] for the first two weeks post-op   o Availability of social support to bring you back to [facility] for follow-ups in clinic, or in case of emergency   o (Ideally) a provider in your local area who is comfortable providing basic routine care for you after surgery, with assistance and guidance from your surgical team at [facility].   Meets Criteria: Yes.

## 2022-06-07 ENCOUNTER — Ambulatory Visit
Admit: 2022-06-07 | Payer: PRIVATE HEALTH INSURANCE | Attending: Rehabilitative and Restorative Service Providers" | Primary: Rehabilitative and Restorative Service Providers"

## 2022-06-07 ENCOUNTER — Ambulatory Visit: Admit: 2022-06-07 | Discharge: 2022-06-08 | Payer: PRIVATE HEALTH INSURANCE | Attending: Clinical | Primary: Clinical

## 2022-06-07 NOTE — Unmapped (Signed)
ASSESSMENT  PT with anxiety, in the context of multiple historical psychiatric concerns, who would benefit from reassurance and guidance in his current stage of identity development, including key skills he will need to navigate this phase of his life (midlife). In particular the PT requires improved boundaries and greater confidence in his core self. The PT is also out-growing his attachment to prior mental health labels that defined his self-identity and is becoming ready to understand himself apart from these categories. Additionally at present the PT has made great strides in self-acceptance as a trans-male. Panic attacks are improved overall from baseline and require less clonazepam for management - it is recommended to continue working on this weaning process.  Intervention Type: CBT/ACT  Stage of Change: Planning    ICD-10-CM   1. Anxiety  F41.9        PLAN   1. F/U with behavioral health consultant in about 4 weeks   2. Medications: No changes   3. Behavioral recommendation(s):    A. PT to increase mindfulness of the ways in which he chooses to use his emotional currency in relationships so that he can more actively choose how to be in relationships    SUBJECTIVE  Patient here for anxiety. The patient reported the following symptoms/ concerns: improved overall with positive feeling about work with his current PCP to eliminate ETOH, reduce reliance on clonazepam, eliminate use of percocet, manage mental health, and continue work on transitioning; panic attacks improved with 3-5 in the last month. PT working on next surgery for transitioning process and desires letter which will be provided after next visit with PT. PT discussed main concerns as: 1. Feeling like his prior diagnoses were a product of his environment and other challenges like his gender/ sexual identity, 2. Current stress provided by dysfunctional relationships he allows, 3. Wondering if he is on the autism spectrum. We discussed how it is very possible, given recent functioning, how prior dx may have been accurate for the time but no longer and how he is likely not on the spectrum but may have characteristics. We also discussed how he can find his identity at present outside of external labels and find a more solid self in midlife that can help him navigate relationships better - at present he uses external relationships for self-validation which leads to dysfunction. PT amenable and appreciative. Progress towards prior plan: Partial  Duration of problem: several years  Current severity of problem: mild    OBJECTIVE  Referred by: Kern Reap, MD  Orientation & Cognition: Oriented times 3. Associations logical and no signs of thought disorder.  Mood/ Affect: normal  Appearance: Appropriately dressed and groomed.  Harm to self or others: None  Substance use/ misuse: Denies  Psychiatric Medication use: Adherent all of the time.  Completion of screening measures: No    -----------------------------------------------------------------------------  Other(s) present in the room: None  Time spent face-to-face with patient: 45 minutes    The patient was informed of the following characteristics of their care within this medical home: a. Behavioral health providers operate as consultants and not stand-alone providers of care, b. All information discussed with team members as applicable/ appropriate will be documented in the shared electronic health record and visible by all team members. The patient provided verbal consent to treatment.

## 2022-06-07 NOTE — Unmapped (Signed)
[facility] THERAPY SERVICES AT [facility] [place]  OUTPATIENT PHYSICAL THERAPY  06/07/2022  Note Type: Treatment Note       Patient Name: Vincent Waters  Date of Birth:[birth year]  Diagnosis:   Encounter Diagnoses   Name Primary?    Neck pain on right side Yes    Chronic right shoulder pain     Shoulder stiffness, right     Thoracic outlet syndrome      Referring MD:  Dawna Part, *     Date of Onset of Impairment-09/18/2016  Date PT Care Plan Established or Reviewed-05/06/2022  Date PT Treatment Started-05/06/2022   Plan of Care Effective Date:          Assessment/Plan:    Assessment  Assessment details:    Vincent Waters is doing great.  ROM is rapidly improving.  He still has some residual abduction and IR restriction.  Educated him on exercises to address those impairments and we are starting with postural/posterior shoulder strengthening.  He is tolerating it well, but he fatigues quickly.  Written instructions given for for new stretches, AAROM and strengthening (L5 band given).  He committed to performing them daily as part of his home exercise program.  All questions answered.  This patient requires skilled physical therapy services to address the outlined impairments in order to return to his desired level of function.           Impairments: decreased strength, hypertonicity, impaired ADLs, muscular restrictions, impaired flexibility, impaired sensation, joint restriction, pain, postural weakness and trigger points                      Therapy Goals      Goals:      Short Term Goals:  In 6 weeks:  Goal #1: Patient will be independent with progressively monitored home exercise program.   Baseline/Current: Patient is unfamiliar with home exercise program.     Goal #2: Patient will demonstrate cervical ROM within normal limits with minimal symptoms in order to allow for functional activities such as safe driving and looking before crossing the street.   Baseline/Current: Patient demonstrates painful and limited cervical ROM (see ROM testing).     Goal #3: Patient will demonstrate right shoulder range of motion within normal limits with minimal symptoms for proper glenohumeral joint mechanics in order to allow for daily functional tasks.   Baseline/Current: Patient has painful and limited right shoulder range of motion (see ROM testing).     Goal #4: Patient will demonstrate improved posture without cuing.  Baseline/Current: Patient has slumped posture and rounded shoulders.    Long Term Goals:  In 12 weeks:  Goal #1: Patient will demonstrate right shoulder strength within normal limits with minimal symptoms in order to allow for pain free lifting and reaching with right upper extremity.   Baseline/Current: Patient has painful and limited right shoulder strength (see manual muscle testing).     Goal #2: Patient will be able to reach over head with minimal symptoms in order to place/remove an object over shoulder height in a cabinet.  Baseline/Current: Patient has difficulty reaching over head at present.    Goal #3: Patient will return to recreational activities (working out in gym) without limit.  Baseline/Current: Patient unable to do any upper body exercises at present.    Goal #4: Patient's FOTO score will improve from 34 at initial evaluation to >/= 52 at discharge in order to demonstrate a significant improvement in self-reported outcome measures  that directly result from his course of physical therapy.   Baseline/Current: FOTO = 34    Plan    Therapy options: will be seen for skilled physical therapy services    Planned therapy interventions: Body Mechanics Training, Dry Needling, Education - Patient, Home Exercise Program, Manual Therapy, Neuromuscular Re-education, Postural Training, Therapeutic Activities and Therapeutic Exercises      Frequency: 1x week    Duration in weeks: 12    Education provided to: patient.    Education provided: HEP, Body mechanics, Importance of Therapy, Treatment options and plan, Symptom management and Body awareness    Education results: verbalized good understanding, demonstrates understanding and needs further instruction.    Communication/Consultation: N/A.    Next visit plan:        Manual therapy to promote improved SterC, AcroC, GHJ motion and loosening muscles, stretching, AAROM, postural/shoulder strengthening    Total Session Time: 45    Treatment rendered today:      Therapeutic Exercise: 30 minutes  - Patient Education: Home Exercise Program With Handout  - Wall Slides Working Into Scaption/Abduction: 15 x  - Side Lying (right) Sleeper Stretch: 10 x 10  - Supine IR @ 90 degrees on Towel Roll: 15 x  - Supine HBH: 15 x  - Supine IR on Towel Roll, c contralateral AP pressure on shoulder: 15 x  - HBB c Contralateral Pull: 10 x  - Seated Bilateral Horizontal Abduction With Band: 2 x 10 (L5)    Manual Therapy: 15 minutes  - PROM Flexion With Inferior Glide  - PROM Scaption with Inferior Glide scooping working into abduction  - Pin and Stretch to right scapular depressors  - GHJ AP @ 90 degrees in IR  - PROM Scaption/Distraction Working into [employer] details: Physiotec ID: [id number]       Subjective:   History of Present Condition  Date of Surgery: 11/16/2021  Surgical Procedure: Other (Right anterior scalenectomy, middle scalenectomy, first rib resection and right Pectoralis minor release)  History of Present Condition/Chief Complaint:       Vincent Waters is a 41 y.o. right hand dominant adult who presents to outpatient physical therapy with right neck and shoulder pain that began in early 2018 when he was changing the oil in his car when it fell onto his sternum.  He went to the ER and got an x-ray which showed a sternal fracture.  Since then, he has had persistent issues and nerve pressure in his right neck/shoulder which led him to get a right anterior scalenectomy, middle scalenectomy, first rib resection and right Pectoralis minor release surgery performed on 11/16/2021 by Thora Lance, MD.  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    Date of Onset:  09/18/2016  Date is an approximation?: yes  Explanation:  Patient reports onset of symptoms in early 2018.     Subjective:     Mr. Clyne reports that he's doing well.  He isn't having much pain, but he does feel like he's still missing some ROM and strength.  He has been doing his exercises consistently.    Pain  Current pain rating: 0  Progression: improved          Diagnostic Tests    Diagnostic Test Comments:                     Objective:     Static Posture Assessment  Shoulders-Rounded.  Thoracic Spine-Hyperkyphosis.    Palpation     Right Shoulder   Hypertonic in the latissimus, pectoralis major, pectoralis minor, subscapularis and teres major.     Right Cervical/Thoracic   Hypertonic in the latissimus, pectoralis major and pectoralis minor.     Range of Motion      Restricted Abduction and IR    Joint Mobility  Shoulder  Right Shoulder Joint Mobility    Posterior capsule: hypomobile  AC joint: WFL  [place] joint: WFL                                  I attest that I have reviewed the above information.  Signed: Patton Salles, PT  06/07/2022 1:51 PM

## 2022-06-08 NOTE — Unmapped (Signed)
Pre-procedure call made and RN spoke with patient    Recent infection/antibiotics. No    Are you diabetic? No Monitor Blood Glucose Daily? N/A    Taking any recent blood thinners/NSAIDs. No    Any electronic implants No    Pregnancy N/A    Driver necessary No    Patient informed to arrive 30 minutes before procedure appointment time.  Patient verbalized understanding to all.

## 2022-06-09 ENCOUNTER — Ambulatory Visit
Admit: 2022-06-09 | Discharge: 2022-06-10 | Payer: PRIVATE HEALTH INSURANCE | Attending: Anesthesiology | Primary: Anesthesiology

## 2022-06-09 DIAGNOSIS — M7918 Myalgia, other site: Principal | ICD-10-CM

## 2022-06-09 MED ADMIN — bupivacaine HCl (MARCAINE) 0.25 % (2.5 mg/mL) injection 50 mg: 20 mL | @ 17:00:00 | Stop: 2022-06-09

## 2022-06-09 NOTE — Unmapped (Signed)
AVS reviewed with patient and he verbalized understanding to all.

## 2022-06-09 NOTE — Unmapped (Addendum)
Trigger Point Injection of 3+ muscle groups: right trapezius, rhomboids, cervical paraspinals, levator scapulae, teres minor  Pre-operative diagnosis: myofascial pain  Post-operative diagnosis: Same    Attending Physician: Dr. Criss Rosales  Assistant: Dr. Marcelle Smiling    Vincent Waters is a 41 y.o. 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 Gayla Doss in May 2023.  He was first seen by our clinic in March 2023 for diagnostic injections.  His range of motion has an increased postoperatively, but still has tight, spasming pain in the right neck and shoulder.  He is currently weaning off benzodiazepines, discontinued opioid medications which he was chronically on for 3 years, and inquires about optimizing medications and procedures to improve his baseline pain at this time.  His primary pain generator includes myofascial pain post-TOS decompression. Patient presents with worsened pain localized to right neck and shoulder area. He denies radiation of pain down his RUE. Patient presents for TPI injections today.    12/21-Last TPI in November.  Notes improvement with TPI, and that even the physical therapist has been able to tell a difference and seen improvement.    Serial consent obtained 05/10/22.    After risks and benefits were explained including bleeding, infection, worsening of the pain, damage to the area being injected, weakness, allergic reaction to medications, vascular injection, pneumothorax and nerve damage, signed consent was obtained.  All questions were answered.   The patient is not taking antiplatelet or anticoagulation medications and does not have a driver today.    The area of the trigger point was identified and the skin prepped with chloroprep.  Next, a 27 Pastor 0.5 inch needle was placed in the area of the trigger point.  Dry needling was performed in the area of the trigger point.  Once reproduction of the pain was elicited and negative aspiration confirmed, the trigger point was injected with 0.5-1 ml of 0.25% bupivacaine and the needle removed.      The patient did tolerate the procedure well and there were not complications.      Trigger point locations: right trapezius, rhomboids, cervical paraspinals, levator scapulae, teres minor    The patient was monitored for 15 minutes after the procedure.  Vital signs remained normal and the patient ambulated out of clinic    Pre-procedure pain score: 7/10  Post-procedure pain score: 5/10    DISPO:  Patient has a clinic follow up 2/19.  Trying to avoid opioids if we can through procedures and nonopioid medications.  Can repeat these in around a month.    Orders Placed This Encounter   Procedures    Trigger Point Injs 3+ Muscles (16109)     Lobonc, 30 minutes, repeat TPI, no driver, no PIV, spine or quad, 4 weeks     Standing Status:   Future     Standing Expiration Date:   06/10/2023

## 2022-06-09 NOTE — Unmapped (Signed)
POST PROCEDURE INSTRUCTIONS   You may apply an ice pack 20-30 minutes at a time to the injection site if you experience soreness.     Keep the injection site clean and dry. You make remove the band-aid one day following the procedure.     You may take a shower but AVOID getting in to baths, pools or whirlpools for 48 HOURS AFTER THE PROCEDURE.       ACTIVITY   Refrain from heavy activity for the next 24 to 48 hours. General walking is okay. You may resume your normal activities the day following the procedure.   You may start or resume your individualized exercise program or physical therapy 48 hours after the procedure.   MEDICATIONS   Please note that it is okay to continue other prescribed medications (blood pressure, insulin, water pill, depression/anxiety pill, etc.) as well as other prescribed pain medications such as Neurontin, Lyrical, Celebrex, Ultram, Vicodin, Norco and acetaminophen (Tylenol).   SIDE EFFECTS   Increase in pain during the first 24 to 48 hours.     You might experience:   1. Mild to moderate swelling at the joint.   2. Possible bruising at the injection site.     WHEN TO CALL THE DOCTOR/NURSE   Severe pain, worse or different that the pain you had before the procedure.     Fever or chills.     Redness, or swelling around the injection site.     Call the Pain Management  Procedural nurses [phone],  during normal business hours (7 am-3 pm). If it is AFTER HOURS or during a weekend or holiday, call the hospital operator and ask for the Anesthesia Pain physician on call at [phone].   FOR EMERGENCIES, CALL 911 OR GO TO THE NEAREST HOSPITAL EMERGENCY DEPARTMENT.   ?   TO SCHEDULE APPOINTMENTS OR FOR QUESTIONS RELATED TO MEDICATIONS   Call the [facility] at [phone]

## 2022-06-09 NOTE — Unmapped (Signed)
[facility]   Patient Onboarding/Medication Counseling    Vincent Waters is a 41 y.o. adult with chronic urticaria/allergic rhinitis who I am counseling today on continuation of therapy.  I am speaking to the patient.    Was a Nurse, learning disability used for this call? No    Verified patient's date of birth / HIPAA.    Specialty medication(s) to be sent: Inflammatory Disorders: Xolair      Non-specialty medications/supplies to be sent: n/a      Medications not needed at this time: n/a       The patient declined counseling on medication administration, missed dose instructions, goals of therapy, side effects and monitoring parameters, warnings and precautions, and drug/food interactions because they have taken the medication previously. The information in the declined sections below are for informational purposes only and was not discussed with patient. We reviewed storage requirements for Xolair.      Xolair Syringes (omalizumab)    Medication & Administration     Dosage: Inject 300mg  under the skin every 28 days    Administration:     Vials: Must be administered as a subcutaneous injection in the abdomen or thighs by a healthcare professional. Patient will be observed after receiving first 3 injections in clinic before moving to home administration.    Prefilled Syringe:  Home administration screening questions:    Has patient ever had an anaphylaxis reaction to Xolair or other agents, such as foods, drugs, biologics, etc? No    Has patient received at least 3 doses in clinic under the supervision of a healthcare proefssional? Yes    Does the patient have an Epi-pen to use for possible anaphylaxis reaction? Yes    Injection administration:   Gather all supplies needed for injection on a clean, flat working surface: medication syringe(s) removed from packaging, alcohol swab, sharps container, etc.  Look at the medication label - look for correct medication, correct dose, and check the expiration date  Look at the medication - the liquid in the syringe should appear clear and colorless to slightly yellow, you may see a few white particles  Lay the syringe on a flat surface and allow it to warm up to room temperature for at least 15-30 minutes  Select injection site - you can use the front of your thigh or your belly (but not the area 2 inches around your belly button); if someone else is giving you the injection you can also use your upper arm in the skin covering your triceps muscle or in the buttocks  Prepare injection site - wash your hands and clean the skin at the injection site with an alcohol swab and let it air dry, do not touch the injection site again before the injection  Pull off the needle safety cap, do not remove until immediately prior to injection  Pinch the skin - with your hand not holding the syringe pinch up a fold of skin at the injection site using your forefinger and thumb  Insert the needle into the fold of skin at about a 45 degree angle - it's best to use a quick dart-like motion  Push the plunger down slowly as far as it will go until the syringe is empty, if the plunger is not fully depressed the needle shield will not extend to cover the needle when it is removed, hold the syringe in place for a full 5 seconds  Check that the syringe is empty and keep pressing down on the  plunger while you pull the needle out at the same angle as inserted; after the needle is removed completely from the skin, release the plunger allowing the needle shield to activate and cover the used needle  Dispose of the used syringe immediately in your sharps disposal container, do not attempt to recap the needle prior to disposing  If you see any blood at the injection site, press a cotton ball or gauze on the site and maintain pressure until the bleeding stops, do not rub the injection site    Adherence/Missed dose instructions: If you miss a dose take as soon as you remember.  Resume the correct dosing schedule. Goals of Therapy     To treat asthma and or chronic idiopathic urticaria    Side Effects & Monitoring Parameters     Commonly reported side effects  Headache  Nausea, vomiting   Injection site reaction  Loss of strength and energy  Common cold symptoms, sore throat, stuffy nose  Ear pain  Painful extremities     The following side effects should be reported to the provider:  Signs of cerebrovascular disease (change in strength on one side is greater than the other, trouble speaking or thinking, change in balance or vision changes)  Signs of DVT (swelling, warmth, numbness, change in color or pain in extremities)  Signs of anaphylaxis (wheezing, chest tightness, swelling of face, lips, tongue or throat)    Monitoring Parameters:   Anaphylactic/hypersensitivity reactions (observe patients for 2 hours after the first 3 injections and 30 minutes after subsequent injections or in accordance with individual institution policies and procedures);   Baseline serum total IgE; FEV1, peak flow, and/or other pulmonary function tests  Monitor for signs of infection    Contraindications, Warnings, & Precautions   [place] Boxed Warning]: Anaphylaxis, including delayed-onset anaphylaxis, has been reported following administration; anaphylaxis may present as bronchospasm, hypotension, syncope, urticaria, and/or angioedema of the throat or tongue. Anaphylaxis has occurred after the first dose and in some cases >1 year after initiation of regular treatment. Due to the risk, patients should be observed closely for an appropriate time period after administration and should receive treatment only under direct medical supervision. Healthcare providers should be prepared to administer appropriate therapy for managing potentially life-threatening anaphylaxis. Patients should be instructed on identifying signs/symptoms of anaphylaxis and to seek immediate care if they arise.      Contraindications  Severe hypersensitivity reaction to omalizumab or any component of the formulation    Warnings & Precautions  Cardiovascular effects: Cerebrovascular events, including transient ischemic attack and ischemic stroke, have been reported.  Eosinophilia and vasculitis: In rare cases, patients may present with systemic eosinophilia, sometimes presenting with clinical features of vasculitis.    Fever/arthralgia/rash: Reports of a constellation of symptoms including fever, arthritis or arthralgia, rash, and lymphadenopathy have been reported with post-marketing use.  Malignant neoplasms: Have been reported rarely with use in short-term studies; impact of long-term use is not known.  Parasitic infections: Use with caution and monitor patients at high risk for parasitic infections; risk of infection may be increased; appropriate duration of continued monitoring following therapy discontinuation has not been established.  Corticosteroid therapy: Gradually taper systemic or inhaled corticosteroid therapy; do not discontinue corticosteroids abruptly following initiation of omalizumab therapy. The combined use of omalizumab and corticosteroids in patients with chronic idiopathic urticaria has not been evaluated.  Latex: Prefilled syringe: The needle cap may contain natural rubber latex.  Appropriate use: Therapy has not been shown to alleviate  acute asthma exacerbations; do not use to treat acute bronchospasm, status asthmaticus, or other allergic conditions. Do not use to treat forms of urticaria other than chronic idiopathic urticaria.  Dosing/IgE levels: Dosing for asthma is based on body weight and pretreatment total IgE serum levels. IgE levels remain elevated up to 1 year following treatment; therefore, levels taken during treatment or for up to 1 year following treatment cannot and should not be used as a dosage guide. Dosing in chronic idiopathic urticaria is not dependent on serum IgE (free or total) level or body weight.  Pregnancy Considerations: Omalizumab is a humanized monoclonal antibody (IgG1). Potential placental transfer of human IgG is dependent upon the IgG subclass and gestational age, generally increasing as pregnancy progresses.  Breastfeeding Considerations: It is not known if omalizumab is present in breast milk; however, IgG is excreted in human milk.  Based on information from the pregnancy exposure registry, an increased risk of adverse events was not observed in breastfed infants of mothers using omalizumab. According to the manufacturer, the decision to breastfeed during therapy should consider the risk of infant exposure, the benefits of breastfeeding to the infant, and benefits of treatment to the mother      Drug/Food Interactions     Medication list reviewed in Epic. The patient was instructed to inform the care team before taking any new medications or supplements. No drug interactions identified.     Storage, Handling Precautions, & Disposal     Store this medication in the refrigerator, 2??C to 8??C (36??F to 46??F). in the original carton.  Protect from direct sunlight and do not freeze. Must be used within 4 hours after removal from refrigerator.  Do not shake.  Dispose of used syringes in a sharps disposal container.      Current Medications (including OTC/herbals), Comorbidities and Allergies     Current Outpatient Medications   Medication Sig Dispense Refill    albuterol 2.5 mg /3 mL (0.083 %) nebulizer solution Inhale 3 mL (2.5 mg total) by nebulization every six (6) hours as needed for wheezing or shortness of breath. 90 mL 1    azelastine (ASTELIN) 137 mcg (0.1 %) nasal spray Instill 2 sprays into each nostril two (2) times a day as needed for rhinitis. 30 mL 1    baclofen (LIORESAL) 10 MG tablet Take 0.5-1 tablets (5-10 mg total) by mouth two (2) times a day as needed for muscle spasms. (Patient not taking: Reported on 06/06/2022) 60 tablet 1    budesonide-formoteroL (SYMBICORT) 160-4.5 mcg/actuation inhaler Inhale 1 puff by mouth Two (2) times a day. 10.2 g 5    cetirizine (ZYRTEC) 10 MG tablet Take 1 tablet (10 mg total) by mouth daily. (Patient not taking: Reported on 06/06/2022) 30 tablet 12    clindamycin (CLEOCIN T) 1 % lotion 1 Application as needed. (Patient not taking: Reported on 06/06/2022)      clonazePAM (KLONOPIN) 0.5 MG tablet Take half tablet (0.25 mg total) by mouth once daily as needed for anxiety. For the next two weeks, every other day, may take an additional half a tablet as needed. After two weeks, decrease to taking half a tablet once daily as needed, without the additional dose 30 tablet 0    dextroamphetamine-amphetamine (ADDERALL) 20 mg tablet Take 1 tablet (20 mg total) by mouth daily as needed. 10 tablet 0    emtricitabine-tenofovir alafen (DESCOVY) 200-25 mg tablet Take 1 tablet by mouth daily. 90 tablet 0    EPINEPHrine (EPIPEN) 0.3 mg/0.3  mL injection Inject 0.3 mL (0.3 mg total) under the skin once for 1 dose. 2 each 0    famotidine (PEPCID) 40 MG tablet Take 1 tablet (40 mg total) by mouth every evening. (Patient not taking: Reported on 06/06/2022) 30 tablet 1    gabapentin (NEURONTIN) 250 mg/5 mL oral solution Take 12 mL (600 mg total) by mouth Three (3) times a day. 1080 mL 3    ibuprofen (MOTRIN) 800 MG tablet Take 1 tablet (800 mg total) by mouth every eight (8) hours as needed for pain. (Patient not taking: Reported on 06/06/2022) 60 tablet 2    inhalational spacing device Spcr Use as directed with albuterol and symbicort (Patient not taking: Reported on 06/06/2022) 1 each 1    nebulizer and compressor (COMP-AIR NEBULIZER COMPRESSOR) Devi 1 each by Miscellaneous route nightly as needed. (Patient not taking: Reported on 06/06/2022) 1 each 0    needle, disp, 25 Kahlen (BD REGULAR BEVEL NEEDLES) 25 Xai x 5/8 Ndle For subcutaneous hormone injection. (Patient not taking: Reported on 06/06/2022) 25 each 0    nifedipine 0.3% lidocaine 1.5% in petrolatum ointment Apply a pea-size amount to anal area 3 times a day (Patient not taking: Reported on 06/06/2022) 100 g 0    omalizumab (XOLAIR) 150 mg/mL syringe Inject 2 mL (300 mg total) under the skin every twenty-eight (28) days. 2 mL 12    pantoprazole (PROTONIX) 20 MG tablet Take 1 tablet (20 mg total) by mouth daily. 30 tablet 1    prucalopride 2 mg Tab Take 1 tablet (2 mg total) by mouth daily. (Patient not taking: Reported on 06/06/2022) 90 tablet 0    safety needles (BD SAFETYGLIDE NEEDLE) 18 Daoud x 1 1/2 Ndle For drawing hormone injection (Patient not taking: Reported on 06/06/2022) 25 each 0    syringe, disposable, (EASY TOUCH LUER LOCK SYRINGE) 1 mL Syrg Use for weekly hormone injection. (Patient not taking: Reported on 06/06/2022) 4 each 0    syringe, disposable, 2.5 mL Syrg Use weekly (Patient not taking: Reported on 06/06/2022) 30 Syringe 2    testosterone cypionate (DEPOTESTOTERONE CYPIONATE) 200 mg/mL injection Inject 0.2 mL (40 mg total) under the skin every seven (7) days. 2 mL 0    triamcinolone (KENALOG) 0.1 % cream Apply topically Two (2) times a day. (Patient not taking: Reported on 06/06/2022) 15 g 0     Current Facility-Administered Medications   Medication Dose Route Frequency Provider Last Rate Last Admin    omalizumab [name]) injection 300 mg  300 mg Subcutaneous Q28 Days [name], [name], [name]   300 mg at 05/24/22 1138       Allergies   Allergen Reactions    Latex Rash and Hives    Penicillins Anaphylaxis     Pt can tolerate amoxicillin   Has patient had a PCN reaction causing immediate rash, facial/tongue/throat swelling, SOB or lightheadedness with hypotension: no   Has patient had a PCN reaction causing severe rash involving mucus membranes or skin necrosis: no  Has patient had a PCN reaction that required hospitalization: unknown  Has patient had a PCN reaction occurring within the last 10 years: no  If all of the above answers are NO, then may proceed with Cephalosporin use.    Shellfish Containing Products Anaphylaxis    Other Rash     Nifedipine 0.3%, Lidocaine 1.5% in petrolatum ointment caused rash on his Bottom    Latex, Natural Rubber Rash    Penicillin Rash  Patient Active Problem List   Diagnosis    Adhesive capsulitis of right shoulder    Anaphylactic shock due to adverse food reaction    Attention deficit hyperactivity disorder (ADHD), combined type    Benzodiazepine dependence, continuous (CMS-HCC)    Breast cancer screening, high risk patient    Constipation    Dermographia    Male-to-male transgender person    Gastroesophageal reflux disease    Generalized anxiety disorder    Mild intermittent asthma without complication    Nondiabetic gastroparesis    Allergic rhinitis    Migraine without aura and without status migrainosus, not intractable    Low iron    BRCA2 gene mutation positive    Chronic pain syndrome    Folliculitis    Pain in eye    Vitamin D deficiency    Snoring    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    Appropriateness of Therapy     Acute infections noted within Epic:  No active infections  Patient reported infection: None    Is medication and dose appropriate based on diagnosis and infection status? Yes    Prescription has been clinically reviewed: Yes      Baseline Quality of Life Assessment      How many days over the past month did your chronic urticaria  keep you from your normal activities? For example, brushing your teeth or getting up in the morning. 0 - Vincent Waters states he has not had any hives in the last year.    Financial Information     Medication Assistance provided: Prior Authorization    Anticipated copay of $0 reviewed with patient. Verified delivery address.    Delivery Information     Scheduled delivery date: 06/10/22    Expected start date: Continuation of therapy - next dose due 06/21/22    Medication will be delivered via Same Day Courier to the prescription address in [facility].  This shipment will not require a signature.      Explained the services we provide at [facility] and that each month we would call to set up refills.  Stressed importance of returning phone calls so that we could ensure they receive their medications in time each month.  Informed patient that we should be setting up refills 7-10 days prior to when they will run out of medication.  A pharmacist will reach out to perform a clinical assessment periodically.  Informed patient that a welcome packet, containing information about our pharmacy and other support services, a Notice of Privacy Practices, and a drug information handout will be sent.      The patient or caregiver noted above participated in the development of this care plan and knows that they can request review of or adjustments to the care plan at any time.      Patient or caregiver verbalized understanding of the above information as well as how to contact the pharmacy at [phone] option 4 with any questions/concerns.  The pharmacy is open Monday through Friday 8:30am-4:30pm.  A pharmacist is available 24/7 via  pager to answer any clinical questions they may have.    Patient Specific Needs     Does the patient have any physical, cognitive, or cultural barriers? No    Does the patient have adequate living arrangements? (i.e. the ability to store and take their medication appropriately) Yes    Did you identify any home environmental safety or security hazards? No    Patient prefers to have medications discussed with  Patient     Is the patient or caregiver able to read and understand education materials at a high school level or above? Yes    Patient's primary language is  English     Is the patient high risk? No    SOCIAL DETERMINANTS OF HEALTH At the [facility], we have learned that life circumstances - like trouble affording food, housing, utilities, or transportation can affect the health of many of our patients.   That is why we wanted to ask: are you currently experiencing any life circumstances that are negatively impacting your health and/or quality of life? Patient declined to answer    Social Determinants of Health     Financial Resource Strain: Unknown (05/26/2022)    Overall Financial Resource Strain (CARDIA)     Difficulty of Paying Living Expenses: Patient refused   Internet Connectivity: Not on file   Food Insecurity: Unknown (05/26/2022)    Hunger Vital Sign     Worried About Running Out of Food in the Last Year: Patient refused     Ran Out of Food in the Last Year: Patient refused   Tobacco Use: Medium Risk (06/07/2022)    Patient History     Smoking Tobacco Use: Former     Smokeless Tobacco Use: Never     Passive Exposure: Not on file   Housing/Utilities: Low Risk  (05/26/2022)    Housing/Utilities     Within the past 12 months, have you ever stayed: outside, in a car, in a tent, in an overnight shelter, or temporarily in someone else's home (i.e. couch-surfing)?: No     Are you worried about losing your housing?: No     Within the past 12 months, have you been unable to get utilities (heat, electricity) when it was really needed?: No   Alcohol Use: Not At Risk (07/27/2021)    Alcohol Use     How often do you have a drink containing alcohol?: Never     How many drinks containing alcohol do you have on a typical day when you are drinking?: Not on file     How often do you have 5 or more drinks on one occasion?: Never   Transportation Needs: Unknown (05/26/2022)    PRAPARE - Transportation     Lack of Transportation (Medical): Patient refused     Lack of Transportation (Non-Medical): No   Substance Use: Low Risk  (07/27/2021)    Substance Use     Taken prescription drugs for non-medical reasons: Never     Taken illegal drugs: Never Patient indicated they have taken drugs in the past year for non-medical reasons: Yes, [positive answer(s)]: Not on file   Health Literacy: Medium Risk (07/27/2021)    Health Literacy     : Rarely   Physical Activity: Unknown (07/27/2021)    Exercise Vital Sign     Days of Exercise per Week: 1 day     Minutes of Exercise per Session: Not on file   Interpersonal Safety: Not on file   Stress: Stress  Concern Present (07/27/2021)    [employer] of Occupational Health - Occupational Stress Questionnaire     Feeling of Stress : To some extent   Intimate Partner Violence: Not At Risk (07/27/2021)    Humiliation, Afraid, Rape, and Kick questionnaire     Fear of Current or Ex-Partner: No     Emotionally Abused: No     Physically Abused: No     Sexually Abused: No   Depression: Not at risk (03/10/2022)    PHQ-2     PHQ-2 Score: 0   Social Connections: Moderately Isolated (07/27/2021)    Social Connection and Isolation Panel [NHANES]     Frequency of Communication with Friends and Family: More than three times a week     Frequency of Social Gatherings with Friends and Family: More than three times a week     Attends Religious Services: More than 4 times per year     Active Member of [employer] or Organizations: No     Attends Banker Meetings: Never     Marital Status: Never married       Would you be willing to receive help with any of the needs that you have identified today? Not applicable       Oliva Bustard, PharmD  [facility] Specialty Pharmacist

## 2022-06-10 MED FILL — XOLAIR 150 MG/ML SUBCUTANEOUS SYRINGE: SUBCUTANEOUS | 28 days supply | Qty: 2 | Fill #4

## 2022-06-14 DIAGNOSIS — F1320 Sedative, hypnotic or anxiolytic dependence, uncomplicated: Principal | ICD-10-CM

## 2022-06-14 DIAGNOSIS — Z789 Other specified health status: Principal | ICD-10-CM

## 2022-06-14 MED ORDER — CLONAZEPAM 0.5 MG TABLET
ORAL_TABLET | 0 refills | 0 days | Status: CP
Start: 2022-06-14 — End: ?
  Filled 2022-06-17: qty 30, 30d supply, fill #0

## 2022-06-14 MED ORDER — TESTOSTERONE CYPIONATE 200 MG/ML INTRAMUSCULAR OIL
SUBCUTANEOUS | 0 refills | 70 days | Status: CP
Start: 2022-06-14 — End: 2022-08-23
  Filled 2022-06-17: qty 2, 56d supply, fill #0

## 2022-06-15 DIAGNOSIS — F902 Attention-deficit hyperactivity disorder, combined type: Principal | ICD-10-CM

## 2022-06-15 MED ORDER — DEXTROAMPHETAMINE-AMPHETAMINE 20 MG TABLET
ORAL_TABLET | Freq: Every day | ORAL | 0 refills | 10 days | PRN
Start: 2022-06-15 — End: 2022-07-15

## 2022-06-16 MED ORDER — DEXTROAMPHETAMINE-AMPHETAMINE 20 MG TABLET
ORAL_TABLET | Freq: Every day | ORAL | 0 refills | 10 days | Status: CP | PRN
Start: 2022-06-16 — End: 2022-07-16
  Filled 2022-06-17: qty 10, 10d supply, fill #0

## 2022-06-16 NOTE — Unmapped (Signed)
Please fill adderall for pt

## 2022-06-24 ENCOUNTER — Ambulatory Visit: Admit: 2022-06-24 | Discharge: 2022-06-25 | Payer: PRIVATE HEALTH INSURANCE

## 2022-06-24 NOTE — Unmapped (Signed)
[facility]  Established Patient Clinic Note    Assessment/Plan:   [name] is a 42 y.o.adult who presents for follow-up.    Problem List Items Addressed This Visit    None      Attending: Dr. Marland Kitchen    # Hip pain  Has been     # GAC  Pursuing metoidioplasty       {TIP - HCC- RAFF Pilot- Clinical Documentation Specialist Recommendations-  Benzodiazepine dependence, continuous   -If this condition is still relevant, please review at the upcoming appointment and provide clinical support of how this is affecting his care, and/or if this is being managed   This text will self delete upon signing note:[id number]}  HEALTH MAINTENANCE ITEMS STILL DUE:  Health Maintenance Due   Topic Date Due    Pneumococcal Vaccine 0-64 (2 - PCV) 06/24/2021     Follow-up: No follow-ups on file.    Future Appointments   Date Time Provider Department Center   06/28/2022 11:30 AM Seymour Bars, PT [facility]   07/05/2022 11:30 AM Seymour Bars, PT [facility]   07/06/2022  9:30 AM Goetzinger, Mardene Celeste, PhD [facility]   07/12/2022 10:45 AM Seymour Bars, PT [facility]   07/12/2022 11:00 AM Mena Goes, PsyD [facility]   07/19/2022 10:45 AM Seymour Bars, PT [facility]   07/22/2022  1:00 PM Lobonc, Ammie Ferrier, MD [facility]   08/03/2022 10:20 AM Mahoro, Champ Mungo, MD Rana[facility] Pila [facility]   08/08/2022 10:00 AM Omar Person, MD [facility]   09/26/2022  8:00 AM [name], MD [facility]   11/17/2022  9:00 AM Thora Lance, MD [facility]G [facility]       Subjective   Vincent Waters is a 42 y.o. adult  coming to clinic today for the following issues:    Chief Complaint   Patient presents with    Follow-up     HPI: see above for more information.     I have reviewed the problem list, medications, and allergies and have updated/reconciled them if needed.    Vincent Waters  reports that he has quit smoking. His smoking use included cigarettes. He has never used smokeless tobacco.  Health Maintenance   Topic Date Due    Pneumococcal Vaccine 0-64 (2 - PCV) 06/24/2021    Lipid Screening  01/22/2025    DTaP/Tdap/Td Vaccines (2 - Td or Tdap) 08/19/2027    Hepatitis C Screen  Completed    COVID-19 Vaccine  Completed    Influenza Vaccine  Completed       Objective     VITALS: BP 104/71 (BP Site: L Arm, BP Position: Sitting, BP Cuff Size: Large)  - Pulse 81  - Temp 36.9 ??C (98.5 ??F) (Temporal)  - Ht 162.6 cm (5' 4)  - Wt 75.3 kg (166 lb)  - LMP 07/20/2017 (Exact Date)  - BMI 28.49 kg/m??     Physical Exam  General: well-appearing, sitting upright in no acute distress  Head: Normocephalic, atraumatic  ENT: No dental trauma noted.   Eyes: conjunctiva normal, non-erythematous, non-icteric, no discharge.  Neck: no thyroid enlargement or masses  Lungs: No increased work of breathing or audible wheezing  Skin: Warm, dry, no erythema or rash on exposed skin  Musculoskeletal: No visible gait abnormalities  Neurologic: Alert & oriented x 3, no gross sensorimotor abnormalities  Psychiatric: Pleasant, cooperative, good eye  contact, appropriate thought processes    Wt Readings from Last 3 Encounters:   06/24/22 75.3 kg (166 lb)   06/06/22 75.8 kg (167 lb)   05/31/22 75.3 kg (166 lb)      PHQ-9 PHQ-9 TOTAL SCORE   10/22/2020   9:00 AM 6   09/04/2020   9:00 AM 3   08/21/2020   9:00 AM 5   05/22/2020   3:00 PM 9     PHQ-2 Score:      PHQ-9 Score:      Inocente Salles Score:      {select_status_or_delete_smartlist:64641}    GAD-7 score:      LABS/IMAGING  {Labs/Imaging options:52765}    Alver Fisher, MD, MPH (he/him)  Resident Physician, PGY-3  Department of Family Medicine        [facility]  [address] [place], [place] [address]  Telephone [phone]  Fax [phone]  [email or web address] erythema or rash on exposed skin  Musculoskeletal: No visible gait abnormalities  Neurologic: Alert & oriented x 3, no gross sensorimotor abnormalities  Psychiatric: Pleasant, cooperative, good eye contact, appropriate thought processes    Wt Readings from Last 3 Encounters:   06/24/22 75.3 kg (166 lb)   06/06/22 75.8 kg (167 lb)   05/31/22 75.3 kg (166 lb)      PHQ-9 PHQ-9 TOTAL SCORE   10/22/2020   9:00 AM 6   09/04/2020   9:00 AM 3   08/21/2020   9:00 AM 5   05/22/2020   3:00 PM 9     LABS/IMAGING  I have reviewed pertinent recent labs and imaging in Epic    Alver Fisher, MD, MPH (he/him)  Resident Physician, PGY-3  Department of Family Medicine        [facility] [place] [facility]  [address] [place], [place] [address]  Telephone [phone]  Fax [phone]  [email or web address]

## 2022-06-24 NOTE — Unmapped (Signed)
Vincent Waters,    It was great to see you today. I look forward to seeing you at your next visit. In the meantime, for non-urgent questions, please message our clinic on MyChart. I am typically able to respond to these messages within 3-5 business days. For more urgent matters, please call our clinic at [phone]. In the event of a medical emergency, call 911 and/or visit our urgent care or an emergency room.     Take care,    [name]    [name], MD, MPH   He, him, his  Resident Physician, PGY-3    [facility]   [address]   [place], [place] [address]   [phone] (Phone); [phone] (Fax)     [facility] MyChart is for non-urgent messages. I do my best to respond within 3 business days, however occasionally it may take longer. If you have immediate concerns, contact our clinic by phone [phone].      [facility] has an Urgent Care!  Family Medicine Urgent Care Hours: Monday-Friday 7am-8pm; Saturday and Sunday 12pm-5pm   If you think you are having an emergency, please call 911 or go to your nearest emergency department.

## 2022-06-28 NOTE — Unmapped (Signed)
Vincent Waters [facility] AT [facility] [place]  OUTPATIENT PHYSICAL THERAPY  06/28/2022  Note Type: Treatment Note       Patient Name: Vincent Waters  Date of Birth:Apr 11, 1981  Diagnosis:   Encounter Diagnoses   Name Primary?    Neck pain on right side Yes    Chronic right shoulder pain     Shoulder stiffness, right     Thoracic outlet syndrome      Referring MD:  Vincent Waters, *     Date of Onset of Impairment-09/18/2016  Date PT Care Plan Established or Reviewed-05/06/2022  Date PT Treatment Started-05/06/2022   Plan of Care Effective Date:          Assessment/Plan:    Assessment  Assessment details:    Vincent Waters's ROM has just about returned to normal.  Vincent Waters continues to have some minor restrictions into IR and abduction, but has normal ROM otherwise.  I think we're at the point where we need to start progressing his shoulder strength.  We started some of that today and Vincent Waters is tolerating it well, but Vincent Waters fatigues quickly.  Written instructions given for bilateral ER with band and DB lifting (flexion, scaption and abduction).  Vincent Waters committed to performing them daily as Waters of his home exercise program.  All questions answered.  This patient requires skilled physical therapy services to address the outlined impairments in order to return to his desired level of function.           Impairments: decreased strength, hypertonicity, impaired ADLs, muscular restrictions, impaired flexibility, impaired sensation, joint restriction, pain, postural weakness and trigger points                      Therapy Goals      Goals:      Short Term Goals:  In 6 weeks:  Goal #1: Patient will be independent with progressively monitored home exercise program.   Baseline/Current: Patient is unfamiliar with home exercise program.     Goal #2: Patient will demonstrate cervical ROM within normal limits with minimal symptoms in order to allow for functional activities such as safe driving and looking before crossing the street. Baseline/Current: Patient demonstrates painful and limited cervical ROM (see ROM testing).     Goal #3: Patient will demonstrate right shoulder range of motion within normal limits with minimal symptoms for proper glenohumeral joint mechanics in order to allow for daily functional tasks.   Baseline/Current: Patient has painful and limited right shoulder range of motion (see ROM testing).     Goal #4: Patient will demonstrate improved posture without cuing.  Baseline/Current: Patient has slumped posture and rounded shoulders.    Long Term Goals:  In 12 weeks:  Goal #1: Patient will demonstrate right shoulder strength within normal limits with minimal symptoms in order to allow for pain free lifting and reaching with right upper extremity.   Baseline/Current: Patient has painful and limited right shoulder strength (see manual muscle testing).     Goal #2: Patient will be able to reach over head with minimal symptoms in order to place/remove an object over shoulder height in a cabinet.  Baseline/Current: Patient has difficulty reaching over head at present.    Goal #3: Patient will return to recreational activities (working out in gym) without limit.  Baseline/Current: Patient unable to do any upper body exercises at present.    Goal #4: Patient's FOTO score will improve from 34 at initial evaluation to >/= 52 at  discharge in order to demonstrate a significant improvement in self-reported outcome measures that directly result from his course of physical therapy.   Baseline/Current: FOTO = 34    Plan    Therapy options: will be seen for skilled physical therapy services    Planned therapy interventions: Body Mechanics Training, Dry Needling, Education - Patient, Home Exercise Program, Manual Therapy, Neuromuscular Re-education, Postural Training, Therapeutic Activities and Therapeutic Exercises      Frequency: 1x week    Duration in weeks: 12    Education provided to: patient.    Education provided: HEP, Body mechanics, Importance of Therapy, Treatment options and plan, Symptom management and Body awareness    Education results: verbalized good understanding, demonstrates understanding and needs further instruction.    Communication/Consultation: N/A.    Next visit plan:        Manual therapy to promote improved SterC, AcroC, GHJ motion and loosening muscles, stretching, AAROM, postural/shoulder strengthening    Total Session Time: 45    Treatment rendered today:      Therapeutic Exercise: 25 minutes  - Patient Education: Home Exercise Program With Handout and Plan of Care   - Wall Ball Slides: 20 x into flexion, 20 x  into abduction  - PROM IR Using Pulleys, working into restriction: 2 x 10  - Bilateral Horizontal Abduction With Band: 2 x 12 (L5)  - Bilateral ER With Band: 2 x 12 (L5)  - DB Flexion, Scaption, Abduction: 5 x (1x each)    Manual Therapy: 20 minutes  - PROM Flexion With Inferior Glide  - GHP AP in various amounts of abduction  - PROM Horizontal Abduction and GHJ PA in 90/90  - PROM Scaption with Inferior Glide scooping working into abduction  - Pin and Stretch to right scapular depressors into abduction  - GHJ AP @ 90 degrees in IR  - PROM Scaption/Distraction Working into [employer] details: Physiotec ID: [id number]       Subjective:   History of Present Condition  Date of Surgery: 11/16/2021  Surgical Procedure: Other (Right anterior scalenectomy, middle scalenectomy, 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 right hand dominant adult who presents to outpatient physical therapy with right neck and shoulder pain that began in early 2018 when Vincent Waters was changing the oil in his car when it fell onto his sternum.  Vincent Waters went to the ER and got an x-ray which showed a sternal fracture.  Since then, Vincent Waters has had persistent issues and nerve pressure in his right neck/shoulder which led him to get a right anterior scalenectomy, middle scalenectomy, first rib resection and right Pectoralis minor release surgery performed on 11/16/2021 by Vincent Lance, MD.  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    Date of Onset:  09/18/2016  Date is an approximation?: yes  Explanation:  Patient reports onset of symptoms in early 2018.     Subjective:     Mr. Balser reports that Vincent Waters's feeling good.  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  Vincent Waters has been doing his exercises consistently.    Pain  Current pain rating: 3  Location: right anterior/superior shoulder  Quality: tight and sore  Progression: improved  Diagnostic Tests    Diagnostic Test Comments:                     Objective:     Static Posture Assessment   Shoulders-Rounded.  Thoracic Spine-Hyperkyphosis.                                  I attest that I have reviewed the above information.  Signed: Patton Salles, PT  06/28/2022 11:38 AM

## 2022-07-01 MED FILL — GABAPENTIN 250 MG/5 ML ORAL SOLUTION: ORAL | 30 days supply | Qty: 1080 | Fill #2

## 2022-07-05 NOTE — Unmapped (Signed)
[facility]  [address]                                  [place], [place] [address]    [phone]    [facility] did not show for their scheduled Physical Therapy follow-up session.  Patient called late to cancel.  Please contact me if you have any questions or concerns.    Thank you for this referral,     Signed: Patton Salles, PT  07/05/2022 10:31 AM

## 2022-07-06 ENCOUNTER — Ambulatory Visit: Admit: 2022-07-06 | Payer: PRIVATE HEALTH INSURANCE | Attending: Psychologist | Primary: Psychologist

## 2022-07-06 NOTE — Unmapped (Signed)
Patient was a no show no call for his pain psychology virtual evaluation on 07/06/2021. Not appropriate for COM.

## 2022-07-08 NOTE — Unmapped (Signed)
[facility] Refill Coordination Note    Vincent Waters, [place]: 1980/10/25  Phone: [phone] (home) [phone] (work)      All above HIPAA information was verified with patient.         07/07/2022     8:50 AM   Specialty Rx Medication Refill Questionnaire   Which Medications would you like refilled and shipped? Xolair   Please list all current allergies: Latex shellfish   Have you missed any doses in the last 30 days? No   Have you had any changes to your medication(s) since your last refill? No   How many days remaining of each medication do you have at home? None   Have you experienced any side effects in the last 30 days? No   Please enter the full address (street address, city, state, zip code) where you would like your medication(s) to be delivered to. [address]  [address] [place] [address]   Please specify on which day you would like your medication(s) to arrive. Note: if you need your medication(s) within 3 days, please call the pharmacy to schedule your order at [phone]  07/08/2022   Has your insurance changed since your last refill? No   Would you like a pharmacist to call you to discuss your medication(s)? No   Do you require a signature for your package? (Note: if we are billing Medicare Part B or your order contains a controlled substance, we will require a signature) No   Additional Comments: None         Completed refill call assessment today to schedule patient's medication shipment from the [facility] [phone]).  All relevant notes have been reviewed.       Confirmed patient received a Conservation officer, historic buildings and a Surveyor, mining with first shipment. The patient will receive a drug information handout for each medication shipped and additional FDA Medication Guides as required.         REFERRAL TO PHARMACIST     Referral to the pharmacist: Not needed      [facility]     Shipping address confirmed in Epic.     Delivery Scheduled: Yes, Expected medication delivery date: 07/12/22. I confirmed new delivery date with [name]. They stated next Xolair dose is not due until 2/2.    Medication will be delivered via Same Day Courier to the prescription address in Epic WAM.    Oliva Bustard, PharmD   [facility] Specialty Pharmacist

## 2022-07-12 ENCOUNTER — Ambulatory Visit
Admit: 2022-07-12 | Payer: PRIVATE HEALTH INSURANCE | Attending: Rehabilitative and Restorative Service Providers" | Primary: Rehabilitative and Restorative Service Providers"

## 2022-07-12 DIAGNOSIS — F1320 Sedative, hypnotic or anxiolytic dependence, uncomplicated: Principal | ICD-10-CM

## 2022-07-12 MED ORDER — CLONAZEPAM 0.5 MG TABLET
ORAL_TABLET | 0 refills | 0 days
Start: 2022-07-12 — End: ?

## 2022-07-12 MED FILL — XOLAIR 150 MG/ML SUBCUTANEOUS SYRINGE: SUBCUTANEOUS | 28 days supply | Qty: 2 | Fill #5

## 2022-07-12 NOTE — Unmapped (Unsigned)
[facility]AP[facility] AT [facility] [place]  OUTPATIENT PHYSICAL THERAPY  07/12/2022          Patient Name: Vincent Waters  Date of Birth:[birth year]  Diagnosis:   Encounter Diagnoses   Name Primary?    Neck pain on right side Yes    Chronic right shoulder pain     Shoulder stiffness, right     Thoracic outlet syndrome      Referring MD:  Dawna Part, *     Date of Onset of Impairment-No date available  Date PT Care Plan Established or Reviewed-No date available  Date PT Treatment Started-No date available   Plan of Care Effective Date:          Assessment/Plan:    Assessment  Assessment details:    Vincent Waters's ROM has just about returned to normal.  He continues to have some minor restrictions into IR and abduction, but has normal ROM otherwise.  I think we're at the point where we need to start progressing his shoulder strength.  We started some of that today and he is tolerating it well, but he fatigues quickly.  Written instructions given for bilateral ER with band and DB lifting (flexion, scaption and abduction).  He committed to performing them daily as part of his home exercise program.  All questions answered.  This patient requires skilled physical therapy services to address the outlined impairments in order to return to his desired level of function.           Impairments: decreased strength, hypertonicity, impaired ADLs, muscular restrictions, impaired flexibility, impaired sensation, joint restriction, pain, postural weakness and trigger points                      Therapy Goals      Goals:      Short Term Goals:  In 6 weeks:  Goal #1: Patient will be independent with progressively monitored home exercise program.   Baseline/Current: Patient is unfamiliar with home exercise program.     Goal #2: Patient will demonstrate cervical ROM within normal limits with minimal symptoms in order to allow for functional activities such as safe driving and looking before crossing the street. Baseline/Current: Patient demonstrates painful and limited cervical ROM (see ROM testing).     Goal #3: Patient will demonstrate right shoulder range of motion within normal limits with minimal symptoms for proper glenohumeral joint mechanics in order to allow for daily functional tasks.   Baseline/Current: Patient has painful and limited right shoulder range of motion (see ROM testing).     Goal #4: Patient will demonstrate improved posture without cuing.  Baseline/Current: Patient has slumped posture and rounded shoulders.    Long Term Goals:  In 12 weeks:  Goal #1: Patient will demonstrate right shoulder strength within normal limits with minimal symptoms in order to allow for pain free lifting and reaching with right upper extremity.   Baseline/Current: Patient has painful and limited right shoulder strength (see manual muscle testing).     Goal #2: Patient will be able to reach over head with minimal symptoms in order to place/remove an object over shoulder height in a cabinet.  Baseline/Current: Patient has difficulty reaching over head at present.    Goal #3: Patient will return to recreational activities (working out in gym) without limit.  Baseline/Current: Patient unable to do any upper body exercises at present.    Goal #4: Patient's FOTO score will improve from 34 at initial evaluation  to >/= 52 at discharge in order to demonstrate a significant improvement in self-reported outcome measures that directly result from his course of physical therapy.   Baseline/Current: FOTO = 34    Plan    Therapy options: will be seen for skilled physical therapy services    Planned therapy interventions: Body Mechanics Training, Dry Needling, Education - Patient, Home Exercise Program, Manual Therapy, Neuromuscular Re-education, Postural Training, Therapeutic Activities and Therapeutic Exercises      Frequency: 1x week    Duration in weeks: 12    Education provided to: patient.    Education provided: HEP, Body mechanics, Importance of Therapy, Treatment options and plan, Symptom management and Body awareness    Education results: verbalized good understanding, demonstrates understanding and needs further instruction.    Communication/Consultation: N/A.    Next visit plan:        Manual therapy to promote improved SterC, AcroC, GHJ motion and loosening muscles, stretching, AAROM, postural/shoulder strengthening    Total Session Time: 45    Treatment rendered today:      Therapeutic Exercise: 25 minutes  - Patient Education: Home Exercise Program With Handout and Plan of Care   - Wall Ball Slides: 20 x into flexion, 20 x  into abduction  - PROM IR Using Pulleys, working into restriction: 2 x 10  - Bilateral Horizontal Abduction With Band: 2 x 12 (L5)  - Bilateral ER With Band: 2 x 12 (L5)  - DB Flexion, Scaption, Abduction: 5 x (1x each)    Manual Therapy: 20 minutes  - PROM Flexion With Inferior Glide  - GHP AP in various amounts of abduction  - PROM Horizontal Abduction and GHJ PA in 90/90  - PROM Scaption with Inferior Glide scooping working into abduction  - Pin and Stretch to right scapular depressors into abduction  - GHJ AP @ 90 degrees in IR  - PROM Scaption/Distraction Working into [employer] details: Physiotec ID: [id number]       Subjective:   History of Present Condition  Date of Surgery: 11/16/2021  Surgical Procedure: Other (Right anterior scalenectomy, middle scalenectomy, 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 right hand dominant adult who presents to outpatient physical therapy with right neck and shoulder pain that began in early 2018 when he was changing the oil in his car when it fell onto his sternum.  He went to the ER and got an x-ray which showed a sternal fracture.  Since then, he has had persistent issues and nerve pressure in his right neck/shoulder which led him to get a right anterior scalenectomy, middle scalenectomy, first rib resection and right Pectoralis minor release surgery performed on 11/16/2021 by Thora Lance, MD.  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    Date of Onset:  09/18/2016  Date is an approximation?: yes  Explanation:  Patient reports onset of symptoms in early 2018.     Subjective:     Vincent Waters reports that ***.  He has been doing his exercises consistently.    Pain  Current pain rating: 3  Location: right anterior/superior shoulder  Quality: tight and sore  Progression: improved          Diagnostic Tests    Diagnostic Test Comments:  Objective:     Static Posture Assessment   Shoulders-Rounded.  Thoracic Spine-Hyperkyphosis.                                  I attest that I have reviewed the above information.  Signed: Patton Salles, PT  07/12/2022 10:39 AM

## 2022-07-12 NOTE — Unmapped (Signed)
[facility]  [address]                                  [place], [place] [address]    [phone]    [facility] did not show for their scheduled Physical Therapy follow-up session.  Patient called late to cancel due to sickness.  Please contact me if you have any questions or concerns.    Thank you for this referral,     Signed: Patton Salles, PT  07/12/2022 2:47 PM

## 2022-07-14 DIAGNOSIS — F902 Attention-deficit hyperactivity disorder, combined type: Principal | ICD-10-CM

## 2022-07-14 MED ORDER — DEXTROAMPHETAMINE-AMPHETAMINE 20 MG TABLET
ORAL_TABLET | Freq: Every day | ORAL | 0 refills | 10 days | PRN
Start: 2022-07-14 — End: 2022-08-13

## 2022-07-15 MED ORDER — CLONAZEPAM 0.5 MG TABLET
ORAL_TABLET | 0 refills | 0 days | Status: CP
Start: 2022-07-15 — End: ?
  Filled 2022-07-22: qty 30, 53d supply, fill #0

## 2022-07-15 MED ORDER — DEXTROAMPHETAMINE-AMPHETAMINE 20 MG TABLET
ORAL_TABLET | Freq: Every day | ORAL | 0 refills | 10 days | Status: CP | PRN
Start: 2022-07-15 — End: 2022-08-14
  Filled 2022-07-22: qty 10, 10d supply, fill #0

## 2022-07-19 ENCOUNTER — Ambulatory Visit
Admit: 2022-07-19 | Payer: PRIVATE HEALTH INSURANCE | Attending: Rehabilitative and Restorative Service Providers" | Primary: Rehabilitative and Restorative Service Providers"

## 2022-07-19 NOTE — Unmapped (Signed)
[facility]AP[facility] SERVICES AT [facility]  OUTPATIENT PHYSICAL THERAPY  07/19/2022  Note Type: Treatment Note       Patient Name: Vincent Waters  Date of Birth:1981/04/29  Diagnosis:   Encounter Diagnoses   Name Primary?    Neck pain on right side Yes    Chronic right shoulder pain     Shoulder stiffness, right     Thoracic outlet syndrome      Referring MD:  Dawna Part, *     Date of Onset of Impairment-09/18/2016  Date PT Care Plan Established or Reviewed-05/06/2022  Date PT Treatment Started-05/06/2022   Plan of Care Effective Date:          Assessment/Plan:    Assessment  Assessment details:    We are switching gears and moving into more strength work.  He fatigues quickly, particularly with posterior shoulder strengthening.  Advised him he will be sore and we're shooting for 1-2 days of soreness.  Written instructions given for side lying ER and prone T/Y.  He committed to performing them daily as part of his home exercise program.  All questions answered.  This patient requires skilled physical therapy services to address the outlined impairments in order to return to his desired level of function.           Impairments: decreased strength, hypertonicity, impaired ADLs, muscular restrictions, impaired flexibility, impaired sensation, joint restriction, pain, postural weakness and trigger points                      Therapy Goals      Goals:      Short Term Goals:  In 6 weeks:  Goal #1: Patient will be independent with progressively monitored home exercise program.   Baseline/Current: Patient is unfamiliar with home exercise program.     Goal #2: Patient will demonstrate cervical ROM within normal limits with minimal symptoms in order to allow for functional activities such as safe driving and looking before crossing the street.   Baseline/Current: Patient demonstrates painful and limited cervical ROM (see ROM testing).     Goal #3: Patient will demonstrate right shoulder range of motion within normal limits with minimal symptoms for proper glenohumeral joint mechanics in order to allow for daily functional tasks.   Baseline/Current: Patient has painful and limited right shoulder range of motion (see ROM testing).     Goal #4: Patient will demonstrate improved posture without cuing.  Baseline/Current: Patient has slumped posture and rounded shoulders.    Long Term Goals:  In 12 weeks:  Goal #1: Patient will demonstrate right shoulder strength within normal limits with minimal symptoms in order to allow for pain free lifting and reaching with right upper extremity.   Baseline/Current: Patient has painful and limited right shoulder strength (see manual muscle testing).     Goal #2: Patient will be able to reach over head with minimal symptoms in order to place/remove an object over shoulder height in a cabinet.  Baseline/Current: Patient has difficulty reaching over head at present.    Goal #3: Patient will return to recreational activities (working out in gym) without limit.  Baseline/Current: Patient unable to do any upper body exercises at present.    Goal #4: Patient's FOTO score will improve from 34 at initial evaluation to >/= 52 at discharge in order to demonstrate a significant improvement in self-reported outcome measures that directly result from his course of physical therapy.   Baseline/Current: FOTO = 34  Plan    Therapy options: will be seen for skilled physical therapy services    Planned therapy interventions: Body Mechanics Training, Dry Needling, Education - Patient, Home Exercise Program, Manual Therapy, Neuromuscular Re-education, Postural Training, Therapeutic Activities and Therapeutic Exercises      Frequency: 1x week    Duration in weeks: 12    Education provided to: patient.    Education provided: HEP, Body mechanics, Importance of Therapy, Treatment options and plan, Symptom management and Body awareness    Education results: verbalized good understanding, demonstrates understanding and needs further instruction.    Communication/Consultation: N/A.    Next visit plan:        Manual therapy to promote improved SterC, AcroC, GHJ motion and loosening muscles, stretching, AAROM, postural/shoulder strengthening    Total Session Time: 45    Treatment rendered today:      Therapeutic Exercise: 25 minutes  - Patient Education: Home Exercise Program With Handout and Plan of Care   - Side Lying ER With Towel: 2 x 10, 2 lb  - Prone Horizontal Abduction With Scapular Adduction: 2 x 10  - Prone Y With Scapular Upward Rotation: 2 x 10    Manual Therapy: 20 minutes  - PROM Flexion With Inferior Glide  - GHP AP in various amounts of abduction  - PROM Horizontal Abduction and GHJ PA in 90/90  - PROM Scaption with Inferior Glide scooping working into abduction  - Pin and Stretch to right scapular depressors into abduction  - PROM Scaption/Distraction Working into [employer] with PA    Plan details: Physiotec ID: [id number]       Subjective:   History of Present Condition  Date of Surgery: 11/16/2021  Surgical Procedure: Other (Right anterior scalenectomy, middle scalenectomy, 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 right hand dominant adult who presents to outpatient physical therapy with right neck and shoulder pain that began in early 2018 when he was changing the oil in his car when it fell onto his sternum.  He went to the ER and got an x-ray which showed a sternal fracture.  Since then, he has had persistent issues and nerve pressure in his right neck/shoulder which led him to get a right anterior scalenectomy, middle scalenectomy, first rib resection and right Pectoralis minor release surgery performed on 11/16/2021 by Thora Lance, MD.  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    Date of Onset:  09/18/2016  Date is an approximation?: yes  Explanation:  Patient reports onset of symptoms in early 2018.     Subjective:     Vincent Waters reports that he has been doing his exercises, but he feels weak.  He has been doing his exercises consistently.    Pain  Current pain rating: 2  Location: right anterior/superior shoulder  Quality: tight and sore  Progression: improved          Diagnostic Tests    Diagnostic Test Comments:                     Objective:     Static Posture Assessment   Shoulders-Rounded.  Thoracic Spine-Hyperkyphosis.    Strength  Right Shoulder   Planes of Motion   Flexion: 4+   Extension: 4-   Abduction: 4   External rotation at 0??: 4-  Internal rotation at 0??: 5   Isolated Muscles   Lower trapezius: 4-   Middle trapezius: 4-                                   I attest that I have reviewed the above information.  Signed: Patton Salles, PT  07/19/2022 10:47 AM

## 2022-07-20 NOTE — Unmapped (Signed)
Pre-procedure call made and RN spoke with patient    Recent infection/antibiotics. denied    Are you diabetic? No Monitor Blood Glucose Daily? No    Taking any recent blood thinners/NSAIDs. No    Any electronic implants No    Pregnancy NA    Driver necessary No    Patient informed to arrive 30 minutes before procedure appointment time.  Patient verbalized understanding to all.

## 2022-07-22 ENCOUNTER — Ambulatory Visit
Admit: 2022-07-22 | Discharge: 2022-07-22 | Payer: PRIVATE HEALTH INSURANCE | Attending: Anesthesiology | Primary: Anesthesiology

## 2022-07-22 DIAGNOSIS — M7918 Myalgia, other site: Principal | ICD-10-CM

## 2022-07-22 MED ADMIN — bupivacaine HCl (MARCAINE) 0.25 % (2.5 mg/mL) injection 50 mg: 20 mL | @ 17:00:00 | Stop: 2022-07-22

## 2022-07-22 NOTE — Unmapped (Signed)
AVS reviewed with patient and he verbalized understanding to all.

## 2022-07-22 NOTE — Unmapped (Signed)
POST PROCEDURE INSTRUCTIONS   You may apply an ice pack 20-30 minutes at a time to the injection site if you experience soreness.     Keep the injection site clean and dry. You make remove the band-aid one day following the procedure.     You may take a shower but AVOID getting in to baths, pools or whirlpools for 48 HOURS AFTER THE PROCEDURE.       ACTIVITY   Refrain from heavy activity for the next 24 to 48 hours. General walking is okay. You may resume your normal activities the day following the procedure.   You may start or resume your individualized exercise program or physical therapy 48 hours after the procedure.   MEDICATIONS   Please note that it is okay to continue other prescribed medications (blood pressure, insulin, water pill, depression/anxiety pill, etc.) as well as other prescribed pain medications such as Neurontin, Lyrical, Celebrex, Ultram, Vicodin, Norco and acetaminophen (Tylenol).   SIDE EFFECTS   Increase in pain during the first 24 to 48 hours.     You might experience:   1. Mild to moderate swelling at the joint.   2. Possible bruising at the injection site.     WHEN TO CALL THE DOCTOR/NURSE   Severe pain, worse or different that the pain you had before the procedure.     Fever or chills.     Redness, or swelling around the injection site.     Call the Pain Management  Procedural nurses [phone],  during normal business hours (7 am-3 pm). If it is AFTER HOURS or during a weekend or holiday, call the hospital operator and ask for the Anesthesia Pain physician on call at [phone].   FOR EMERGENCIES, CALL 911 OR GO TO THE NEAREST HOSPITAL EMERGENCY DEPARTMENT.   ?   TO SCHEDULE APPOINTMENTS OR FOR QUESTIONS RELATED TO MEDICATIONS   Call the [facility] at [phone]

## 2022-07-22 NOTE — Unmapped (Addendum)
Trigger Point Injection of 3+ muscle groups: right trapezius, rhomboids, cervical paraspinals, levator scapulae, teres minor  Pre-operative diagnosis: myofascial pain  Post-operative diagnosis: Same    Attending Physician: Dr. Criss Rosales  Assistant: Dr. Marcelle Smiling    Vincent Waters is a 42 y.o. 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 Gayla Doss in May 2023.  He was first seen by our clinic in March 2023 for diagnostic injections.  His range of motion has an increased postoperatively, but still has tight, spasming pain in the right neck and shoulder.  He is currently weaning off benzodiazepines, discontinued opioid medications which he was chronically on for 3 years, and inquires about optimizing medications and procedures to improve his baseline pain at this time.  His primary pain generator includes myofascial pain post-TOS decompression. Patient presents with worsened pain localized to right neck and shoulder area. He denies radiation of pain down his RUE. Patient presents for TPI injections today.    07/22/22-Last TPI in December.  Overall doing well.  Has been working with PT still, and they are shifting to more weight based treatment.  Requesting same areas as these have been helpful.      Serial consent obtained 05/10/22.    After risks and benefits were explained including bleeding, infection, worsening of the pain, damage to the area being injected, weakness, allergic reaction to medications, vascular injection, pneumothorax and nerve damage, signed consent was obtained.  All questions were answered.   The patient is not taking antiplatelet or anticoagulation medications and does not have a driver today.    The area of the trigger point was identified and the skin prepped with chloroprep.  Next, a 27 Isamu 0.5 inch needle was placed in the area of the trigger point.  Dry needling was performed in the area of the trigger point.  Once reproduction of the pain was elicited and negative aspiration confirmed, the trigger point was injected with 0.5-1 ml of 0.25% bupivacaine and the needle removed.      2cc of 0.25% bupivacaine peri-clavicular incision on the right.     The patient did tolerate the procedure well and there were not complications.      Trigger point locations: right trapezius, rhomboids, cervical paraspinals, levator scapulae, teres minor    The patient was monitored for 15 minutes after the procedure.  Vital signs remained normal and the patient ambulated out of clinic    Pre-procedure pain score: 4/10  Post-procedure pain score: 4/10    DISPO:  Patient has a clinic follow up 2/19.      No orders of the defined types were placed in this encounter.

## 2022-08-03 MED FILL — GABAPENTIN 250 MG/5 ML ORAL SOLUTION: ORAL | 30 days supply | Qty: 1080 | Fill #3

## 2022-08-05 ENCOUNTER — Ambulatory Visit: Admit: 2022-08-05 | Discharge: 2022-08-06 | Payer: PRIVATE HEALTH INSURANCE

## 2022-08-05 ENCOUNTER — Ambulatory Visit
Admit: 2022-08-05 | Payer: PRIVATE HEALTH INSURANCE | Attending: Rehabilitative and Restorative Service Providers" | Primary: Rehabilitative and Restorative Service Providers"

## 2022-08-05 ENCOUNTER — Ambulatory Visit
Admit: 2022-08-05 | Discharge: 2022-09-02 | Payer: PRIVATE HEALTH INSURANCE | Attending: Rehabilitative and Restorative Service Providers" | Primary: Rehabilitative and Restorative Service Providers"

## 2022-08-05 NOTE — Unmapped (Signed)
Hi Vincent Waters,    It was great to see you today. I look forward to seeing you at your next visit. In the meantime, please feel free to message me on mychart with any non-urgent questions or concerns (for example, refill requests for a long-term medication, or inquiries regarding vaccine availability). I do my best to respond to messages within 3-5 business days, however occasionally it may take longer depending on my other clinical duties.     For time-sensitive and/or new clinical concerns (including evaluation of new symptoms, medication adjustments, time-sensitive referral requests, or completion of disability or pre-employment paperwork), please call [phone] to request a phone, video, or in-person visit with me or one of my colleagues, so that we may address your concern as safely and expediently as possible.     In the event of a medical emergency, please call 911 or present immediately to your nearest emergency room. If you ever need urgent help with your mental health, including for any thoughts of self-harm or suicide, please call the [place] mental health crisis line at [phone], or the national suicide prevention hotline at 231 641 7910.     Take care,    Claudie Revering, MD, MPH (he/him/his)  PGY-3, [facility]   [address] [place], [place] [address]   Ph [phone] (Phone) - Fax [phone]

## 2022-08-05 NOTE — Unmapped (Signed)
[facility]APY SERVICES AT [facility] [place]  OUTPATIENT PHYSICAL THERAPY  08/05/2022  Note Type: Treatment Note       Patient Name: Vincent Waters  Date of Birth:[birth year]  Diagnosis:   Encounter Diagnoses   Name Primary?    Neck pain on right side Yes    Chronic right shoulder pain     Shoulder stiffness, right     Thoracic outlet syndrome        Referring MD:  Dawna Part, *     Date of Onset of Impairment-09/18/2016  Date PT Care Plan Established or Reviewed-05/06/2022  Date PT Treatment Started-05/06/2022   Plan of Care Effective Date:          Assessment/Plan:    Assessment  Assessment details:    For whatever reason, Vincent Waters is a bit stiffer and uncomfortable today.  So, we resumed manual therapy to loosen and AAROM exercises to address stiffness for today.  Immediately afterwards, he stated he felt much better.  Encouraged him to resume strengthening once he feels like things are released and he agreed to do this.  All questions answered.  This patient requires skilled physical therapy services to address the outlined impairments in order to return to his desired level of function.           Impairments: decreased strength, hypertonicity, impaired ADLs, muscular restrictions, impaired flexibility, impaired sensation, joint restriction, pain, postural weakness and trigger points                      Therapy Goals      Goals:      Short Term Goals:  In 6 weeks:  Goal #1: Patient will be independent with progressively monitored home exercise program.   Baseline/Current: Patient is unfamiliar with home exercise program.     Goal #2: Patient will demonstrate cervical ROM within normal limits with minimal symptoms in order to allow for functional activities such as safe driving and looking before crossing the street.   Baseline/Current: Patient demonstrates painful and limited cervical ROM (see ROM testing).     Goal #3: Patient will demonstrate right shoulder range of motion within normal limits with minimal symptoms for proper glenohumeral joint mechanics in order to allow for daily functional tasks.   Baseline/Current: Patient has painful and limited right shoulder range of motion (see ROM testing).     Goal #4: Patient will demonstrate improved posture without cuing.  Baseline/Current: Patient has slumped posture and rounded shoulders.    Long Term Goals:  In 12 weeks:  Goal #1: Patient will demonstrate right shoulder strength within normal limits with minimal symptoms in order to allow for pain free lifting and reaching with right upper extremity.   Baseline/Current: Patient has painful and limited right shoulder strength (see manual muscle testing).     Goal #2: Patient will be able to reach over head with minimal symptoms in order to place/remove an object over shoulder height in a cabinet.  Baseline/Current: Patient has difficulty reaching over head at present.    Goal #3: Patient will return to recreational activities (working out in gym) without limit.  Baseline/Current: Patient unable to do any upper body exercises at present.    Goal #4: Patient's FOTO score will improve from 34 at initial evaluation to >/= 52 at discharge in order to demonstrate a significant improvement in self-reported outcome measures that directly result from his course of physical therapy.   Baseline/Current: FOTO = 34  Plan    Therapy options: will be seen for skilled physical therapy services    Planned therapy interventions: Body Mechanics Training, Dry Needling, Education - Patient, Home Exercise Program, Manual Therapy, Neuromuscular Re-education, Postural Training, Therapeutic Activities and Therapeutic Exercises      Frequency: 1x week    Duration in weeks: 12    Education provided to: patient.    Education provided: HEP, Body mechanics, Importance of Therapy, Treatment options and plan, Symptom management and Body awareness    Education results: verbalized good understanding, demonstrates understanding and needs further instruction.    Communication/Consultation: N/A.    Next visit plan:        Manual therapy to promote improved SterC, AcroC, GHJ motion and loosening muscles, stretching, AAROM, postural/shoulder strengthening    Total Session Time: 45    Treatment rendered today:      Therapeutic Exercise: 15  minutes  - Patient Education: Home Exercise Program With Handout and Plan of Care   - Side Lying Sleeper Stretch: 10 x 10  - Supine Unilateral Pec Stretch: 10 x 10, 3 lb  - Ball Wall Slides (red SB): 15 x flexion, 15 x scaption  - Pulleys Behind Back: 25 x    Manual Therapy: 25 minutes  - PROM Flexion With Inferior Glide  - GHP AP in various amounts of abduction  - PROM Horizontal Abduction and GHJ PA in 90/90  - PROM Scaption with Inferior Glide scooping working into abduction  - Pin and Stretch to right scapular depressors into abduction  - PROM Scaption/Distraction Working into [employer] with PA    Plan details: Physiotec ID: [id number]       Subjective:   History of Present Condition  Date of Surgery: 11/16/2021  Surgical Procedure: Other (Right anterior scalenectomy, middle scalenectomy, 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 right hand dominant adult who presents to outpatient physical therapy with right neck and shoulder pain that began in early 2018 when he was changing the oil in his car when it fell onto his sternum.  He went to the ER and got an x-ray which showed a sternal fracture.  Since then, he has had persistent issues and nerve pressure in his right neck/shoulder which led him to get a right anterior scalenectomy, middle scalenectomy, first rib resection and right Pectoralis minor release surgery performed on 11/16/2021 by Vincent Lance, MD.  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    Date of Onset:  09/18/2016  Date is an approximation?: yes  Explanation:  Patient reports onset of symptoms in early 2018.     Subjective:     Vincent Waters reports that is stiff and uncomfortable today.  He had his injections and it's felt a bit off since then  He has been doing his exercises consistently.    Pain  Current pain rating: 2  Location: right anterior/superior shoulder  Quality: tight and discomfort  Progression: improved          Diagnostic Tests    Diagnostic Test Comments:                     Objective:     Static Posture Assessment   Shoulders-Rounded.    Joint Mobility  Shoulder  Right Shoulder Joint Mobility    Posterior capsule: hypomobile  I attest that I have reviewed the above information.  Signed: Patton Salles, PT  08/05/2022 11:12 AM

## 2022-08-05 NOTE — Unmapped (Signed)
[facility]  Established Patient Clinic Note    Assessment/Plan:   Vincent Waters is a 42 y.o.adult who presents for follow-up.    Problem List Items Addressed This Visit    None  Visit Diagnoses       Gastrointestinal distress    -  Primary    Vertigo        Relevant Orders    Ambulatory referral to Neurology    Piercing of left nostril              Attending: Dr. Ralene Bathe    # GI upset  Pt was scheduled for GI visit yesterday but did not attend due to having previously been told he needed to re-start reglan, which he did not have a good response to. Pt would prefer to see Dr. Lolita Lenz at next visit but has not been switched over yet. Emphasized importance of attending follow-up, pt is feeling confident that he will be able to attend next visit.   - GI f/up     # Dizziness - possible vertigo  Pt has been having recurring dizzy spells over the past few weeks, like a whirlwind, not like when he has low blood pressure or is going to faint, but like the room is spinning. Feels like maybe his migraines are trying to start back up. BP wnl today, orthostats negative. When I asked pt if he's been told he has vertigo previously, he says his mom used to have severe vertigo but he himself has not been diagnosed. After discussion of management options, pt would like to proceed with epley maneuver at home, with neuro referral in case symptoms don't resolve.  - Neuro referral  - Home epley maneuver instructions provided    # Nose piercing  Pt got stainless steel nose piercing around Christmas, and since then has continued to have bleeding every day from the piercing site, and there is now a raised, erythematous border around the piercing site, which may be c/w contact dermatitis vs early keloid formation. After discussion of management options, pt in agreement with switching nose ring to a different metal and RTC if no improvement in 1-2 weeks.   - Pt to switch nose ring to different material  - Return precautions provided, pt verbalizes understanding    HEALTH MAINTENANCE ITEMS STILL DUE:  Health Maintenance Due   Topic Date Due    Pneumococcal Vaccine 0-64 (2 of 2 - PCV) 06/24/2021     Follow-up: No follow-ups on file.    Future Appointments   Date Time Provider Department Center   08/05/2022 10:15 AM Seymour Bars, PT PTOT [facility]   08/08/2022 10:00 AM Lobonc, Ammie Ferrier, MD ANESPAINMRKT [facility]   08/16/2022  9:30 AM Mena Goes, PsyD [facility]   08/19/2022  8:00 AM Seymour Bars, PT [facility]   08/22/2022  8:40 AM Kern Reap, MD [facility]   08/31/2022 10:20 AM Mahoro, Champ Mungo, MD UNCGIMEDET [facility]   09/02/2022  8:00 AM Seymour Bars, PT PTOT [facility]   09/16/2022  8:00 AM Seymour Bars, PT PTOT [facility]   09/26/2022  8:00 AM [name], MD UNCALLERGET [facility]   11/17/2022  9:00 AM Thora Lance, MD HVSURG [facility]       Subjective   Vincent Waters is a 42 y.o. adult  coming to clinic today for the following issues:    Chief Complaint   Patient  presents with    Follow-up     HPI: see above for more information.     I have reviewed the problem list, medications, and allergies and have updated/reconciled them if needed.    Vincent Waters  reports that he has quit smoking. His smoking use included cigarettes. He has never used smokeless tobacco.  Health Maintenance   Topic Date Due    Pneumococcal Vaccine 0-64 (2 of 2 - PCV) 06/24/2021    Lipid Screening  01/22/2025    DTaP/Tdap/Td Vaccines (2 - Td or Tdap) 08/19/2027    Hepatitis C Screen  Completed    COVID-19 Vaccine  Completed    Influenza Vaccine  Completed       Objective     VITALS: BP 111/75 (BP Site: L Arm, BP Position: Sitting, BP Cuff Size: Medium)  - Pulse 83  - Temp 36.6 ??C (97.9 ??F) (Temporal)  - Ht 162.6 cm (5' 4) Comment: pt reported - Wt 74 kg (163 lb 3.2 oz)  - LMP 07/20/2017 (Exact Date)  - BMI 28.01 kg/m??     Physical Exam  General: well-appearing, sitting upright in no acute distress  Head: Normocephalic, atraumatic  ENT: No dental trauma noted.   Eyes: conjunctiva normal, non-erythematous, non-icteric, no discharge.  Neck: no thyroid enlargement or masses  Lungs: No increased work of breathing or audible wheezing  Skin: Warm, dry, no erythema or rash on exposed skin  Musculoskeletal: No visible gait abnormalities  Neurologic: Alert & oriented x 3, no gross sensorimotor abnormalities  Psychiatric: Pleasant, cooperative, good eye contact, appropriate thought processes    Wt Readings from Last 3 Encounters:   08/05/22 74 kg (163 lb 3.2 oz)   06/24/22 75.3 kg (166 lb)   06/06/22 75.8 kg (167 lb)      PHQ-9 PHQ-9 TOTAL SCORE   10/22/2020   9:00 AM 6   09/04/2020   9:00 AM 3   08/21/2020   9:00 AM 5   05/22/2020   3:00 PM 9     LABS/IMAGING  I have reviewed pertinent recent labs and imaging in Epic    Alver Fisher, MD, MPH (he/him)  Resident Physician, PGY-3  Department of Family Medicine        [facility]  [address], [place], [place] [address]  Telephone [phone]  Fax [phone]  [email or web address]

## 2022-08-05 NOTE — Unmapped (Deleted)
[facility]  Established Patient Clinic Note    Assessment/Plan:   Mr.Picha is a 42 y.o.adult who presents for follow-up.    Problem List Items Addressed This Visit    None      Attending: Dr. Marland Kitchen    #***    {TIP - HCC- RAFF Pilot- Clinical Documentation Specialist Recommendations-  No specialty comments available.   This text will self delete upon signing note:75688}  HEALTH MAINTENANCE ITEMS STILL DUE:  Health Maintenance Due   Topic Date Due    Pneumococcal Vaccine 0-64 (2 of 2 - PCV) 06/24/2021     Follow-up: No follow-ups on file.    Future Appointments   Date Time Provider Department Center   08/05/2022 10:15 AM Seymour Bars, PT PTOT [facility]   08/08/2022 10:00 AM Lobonc, Ammie Ferrier, MD ANESPAINMRKT [facility]   08/16/2022  9:30 AM Mena Goes, PsyD [facility]   08/19/2022  8:00 AM Seymour Bars, PT PTOT [facility]   08/22/2022  8:40 AM Kern Reap, MD [facility]   08/31/2022 10:20 AM Mahoro, Champ Mungo, MD UNCGIMEDET [facility]   09/02/2022  8:00 AM Seymour Bars, PT PTOT [facility]   09/16/2022  8:00 AM Seymour Bars, PT PTOT [facility]   09/26/2022  8:00 AM [name], MD UNCALLERGET [facility]   11/17/2022  9:00 AM Thora Lance, MD HVSURG [facility]       Subjective   Mr. Brower is a 42 y.o. adult  coming to clinic today for the following issues:    No chief complaint on file.    HPI: see above for more information.     I have reviewed the problem list, medications, and allergies and have updated/reconciled them if needed.    Mr. Frierson  reports that he has quit smoking. His smoking use included cigarettes. He has never used smokeless tobacco.  Health Maintenance   Topic Date Due    Pneumococcal Vaccine 0-64 (2 of 2 - PCV) 06/24/2021    Lipid Screening  01/22/2025    DTaP/Tdap/Td Vaccines (2 - Td or Tdap) 08/19/2027    Hepatitis C Screen  Completed    COVID-19 Vaccine  Completed    Influenza Vaccine Completed       Objective     VITALS: LMP 07/20/2017 (Exact Date)     Physical Exam  General: well-appearing, sitting upright in no acute distress  Head: Normocephalic, atraumatic  ENT: No dental trauma noted.   Eyes: conjunctiva normal, non-erythematous, non-icteric, no discharge.  Neck: no thyroid enlargement or masses  Lungs: No increased work of breathing or audible wheezing  Skin: Warm, dry, no erythema or rash on exposed skin  Musculoskeletal: No visible gait abnormalities  Neurologic: Alert & oriented x 3, no gross sensorimotor abnormalities  Psychiatric: Pleasant, cooperative, good eye contact, appropriate thought processes    Wt Readings from Last 3 Encounters:   06/24/22 75.3 kg (166 lb)   06/06/22 75.8 kg (167 lb)   05/31/22 75.3 kg (166 lb)      PHQ-9 PHQ-9 TOTAL SCORE   10/22/2020   9:00 AM 6   09/04/2020   9:00 AM 3   08/21/2020   9:00 AM 5   05/22/2020   3:00 PM 9     PHQ-2 Score:      PHQ-9 Score:      Edinburgh Score:      {select_status_or_delete_smartlist:64641}    GAD-7 score:  LABS/IMAGING  {Labs/Imaging options:52765}    Alver Fisher, MD, MPH (he/him)  Resident Physician, PGY-3  Department of Family Medicine        [facility] [place] [facility]  [address], [place], [place] [address]  Telephone [phone]  Fax [phone]  [email or web address]

## 2022-08-06 NOTE — Unmapped (Signed)
[facility] Refill Coordination Note    Vincent Waters, Phillips: [birth year]  Phone: [phone] (home) [phone] (work)      All above HIPAA information was verified with patient.         08/05/2022     3:55 PM   Specialty Rx Medication Refill Questionnaire   Which Medications would you like refilled and shipped? Xolair   Please list all current allergies: Latex,   Have you missed any doses in the last 30 days? No   Have you had any changes to your medication(s) since your last refill? No   How many days remaining of each medication do you have at home? None   If receiving an injectable medication, next injection date is 09/21/2022   Have you experienced any side effects in the last 30 days? No   Please enter the full address (street address, city, state, zip code) where you would like your medication(s) to be delivered to. [address] [place] [address]   Please specify on which day you would like your medication(s) to arrive. Note: if you need your medication(s) within 3 days, please call the pharmacy to schedule your order at [phone]  08/09/2022   Has your insurance changed since your last refill? No   Would you like a pharmacist to call you to discuss your medication(s)? No   Do you require a signature for your package? (Note: if we are billing Medicare Part B or your order contains a controlled substance, we will require a signature) No         Completed refill call assessment today to schedule patient's medication shipment from the [facility] [phone]).  All relevant notes have been reviewed.       Confirmed patient received a Conservation officer, historic buildings and a Surveyor, mining with first shipment. The patient will receive a drug information handout for each medication shipped and additional FDA Medication Guides as required.         REFERRAL TO PHARMACIST     Referral to the pharmacist: Not needed      [facility]     Shipping address confirmed in Epic. Delivery Scheduled: Yes, Expected medication delivery date: 08/09/22.     Medication will be delivered via Same Day Courier to the prescription address in Epic [place].    Vincent Waters   [facility] Specialty Technician

## 2022-08-08 NOTE — Unmapped (Unsigned)
Department of Anesthesiology  [facility]  [address], [address]  [place], [place] [address]  [phone]    Chronic Pain Follow-Up Note  No diagnosis found.      Assessment and Plan  Vincent Waters is a 42 y.o. 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 Gayla Doss in May 2023.  He was first seen by our clinic in March 2023 for diagnostic injections.  His range of motion has an increased postoperatively, but still has tight, spasming pain in the right neck and shoulder.  He is currently weaning off benzodiazepines, discontinued opioid medications in late 2023 which he was chronically on for 3 years, and inquires about optimizing medications and procedures to improve his baseline pain at this time.  His primary pain generator includes myofascial pain post-TOS decompression.  He has other medical history that includes anxiety, scleroderma, migraines, autoimmune autonomic neuropathy, asthma.  Also follows with sports medicine and has undergone injections there as well.      February 2024  At last visit in December, patient presented reporting substantial improvement in pain, in which he attributed to starting PT and having TPIs on 05/10/22. He reported the TPI's brought his pain from a 9 down to a 5. He was currently taking gabapentin 600 mg TID and Motrin 400-800 mg nightly prn with benefit. We advised patient not increase his NSAIDs use (per GI team recs) and patient understood to be cautious. The patient was currently working on tapering his Klonopin with outside provider. He was hesitant to take Baclofen due to not being a fan of muscle relaxant and noted taking sparingly. He recently had left GTB injection with sports med and updated imaging for left hip bursitis. We would continue his regimen without changes. Patient had repeat TPIs scheduled for next week.     Today, ***    Thoracic Outlet Syndrome - Chronic Pain Syndrome ***  - Continue following with PT  - Scheduled repeat TPI's on 06/09/22  - Continue gabapentin 600 mg oral solution TID, we will formally take this over, but has refills currently  - Continue clonazepam taper with PCP  - Continue baclofen 5-10 mg BID prn (pt reports using sparingly)    Left Trochanteric Bursitis ***  Follows with [facility] and recently underwent cortisone injection on 12/8 with 2 days of relief. Hip XR at that time showed minimal bilateral SI joint arthritis, unchanged from prior.  - Could consider MRI hips or further imaging to r/o muscle tear & other injuries given chronic nature of this pain.  But discussed following up sports med with results of CSI given they have followed this pain and have done injections previously.      Considerations for the future:  -Consider initiation of Butrans/buprenorphine product after weaning off of benzodiazepines and pain psych evaluation for COM should conservative measures fail, but hoping to avoid if possible  -Consider up titration of gabapentin oral solution or rotation to Lyrica   -Hip MRI without contrast    Urine toxicology: 04/28/22  Urine toxicology screen was appropriate.   Last Opioid Change: Last Rx in 04/28/22  Last EKG: N/A  Previous Compliance Issues: None  Naloxone ordered: no  Total morphine equivalents: 0  Benzodiazepine: Yes. Weaning.  NCCSRS database was reviewed 08/07/22     No follow-ups on file.    No orders of the defined types  were placed in this encounter.    Requested Prescriptions      No prescriptions requested or ordered in this encounter         HPI:  Vincent Waters is seen in consultation at the request of Dr. Gayla Doss for evaluation and recommendations regarding His pain.     Patient was last seen in December. Since last visit, the patient has followed with Urology, Plastic Surgery, Family Medicine, and PT. The patient underwent TPIs of right trapezius, rhomboids, cervical paraspinals, levator scapulae, teres minor with our clinic on 07/22/22.    Today, ***    In terms of medications, ***    Current Medications:  Percocet, related to multiple pains- no longer taking  Gabapentin 600 mg TID- has been on for years  Voltaren gel  Baclofen prn    The patient states his pain is located *** and the severity of his pain ranges from ***/10 to ***/10.  His pain currently is ***/10 and on average is ***/10. He describes the sensation of his pain as {pain described:58928}. His pain is present {pain frequency:58931} and worst {pain worst:59685}. The patient???s pain impacts {negatively affected:59709}. His interval history includes {interval history:59710}. His pain {pain changed:59711}, and he {does does not blank:47747} have new pain to discuss today. He {is/is not:36772} on blood thinners or anti-coagulants. In regards to medications currently taken for pain management, the patient {is:54246::is} tolerating these medications well and complains of associated side effects: {pain med side effects:59712}.      Previous Medication Trials:  NSAIDS- ibuprofen  Antidepressants-   Anticonvulsant- gabapentin  Muscle relaxants- flexeril, baclofen  Topicals-voltaren gel  Short-acting opiates-percocet   Long-acting opiates-   Anxiolytics-   Other-     Previous Interventions  Medications, PT, surgery, injections with sports med  TOS decompression/PMR 11/16/21  TPI-05/10/22-very helpful.  PT-2023  Left GTB CSI-sports med 05/27/22      Allergies as of 08/08/2022 - Reviewed 08/05/2022   Allergen Reaction Noted    Latex Rash and Hives 09/16/2016    Penicillins Anaphylaxis 09/16/2016    Shellfish containing products Anaphylaxis 01/23/2017    Other Rash 12/16/2020    Latex, natural rubber Rash 08/20/2015    Penicillin Rash 02/14/2018      Current Outpatient Medications   Medication Sig Dispense Refill    albuterol 2.5 mg /3 mL (0.083 %) nebulizer solution Inhale 3 mL (2.5 mg total) by nebulization every six (6) hours as needed for wheezing or shortness of breath. 90 mL 1    azelastine (ASTELIN) 137 mcg (0.1 %) nasal spray Instill 2 sprays into each nostril two (2) times a day as needed for rhinitis. 30 mL 1    baclofen (LIORESAL) 10 MG tablet Take 0.5-1 tablets (5-10 mg total) by mouth two (2) times a day as needed for muscle spasms. 60 tablet 1    budesonide-formoteroL (SYMBICORT) 160-4.5 mcg/actuation inhaler Inhale 1 puff by mouth Two (2) times a day. 10.2 g 5    cetirizine (ZYRTEC) 10 MG tablet Take 1 tablet (10 mg total) by mouth daily. 30 tablet 12    clindamycin (CLEOCIN T) 1 % lotion 1 Application as needed.      clonazePAM (KLONOPIN) 0.5 MG tablet Take half tablet (0.25 mg total) by mouth once daily as needed for anxiety. For the next two weeks, every other day, may take an additional half a tablet as needed. After two weeks, decrease to taking half a tablet once daily as needed, without the additional dose 30  tablet 0    dextroamphetamine-amphetamine (ADDERALL) 20 mg tablet Take 1 tablet (20 mg total) by mouth daily as needed. 10 tablet 0    emtricitabine-tenofovir alafen (DESCOVY) 200-25 mg tablet Take 1 tablet by mouth daily. 90 tablet 0    EPINEPHrine (EPIPEN) 0.3 mg/0.3 mL injection Inject 0.3 mL (0.3 mg total) under the skin once for 1 dose. 2 each 0    famotidine (PEPCID) 40 MG tablet Take 1 tablet (40 mg total) by mouth every evening. 30 tablet 1    gabapentin (NEURONTIN) 250 mg/5 mL oral solution Take 12 mL (600 mg total) by mouth Three (3) times a day. 1080 mL 3    ibuprofen (MOTRIN) 800 MG tablet Take 1 tablet (800 mg total) by mouth every eight (8) hours as needed for pain. 60 tablet 2    inhalational spacing device Spcr Use as directed with albuterol and symbicort 1 each 1    nebulizer and compressor (COMP-AIR NEBULIZER COMPRESSOR) Devi 1 each by Miscellaneous route nightly as needed. 1 each 0    needle, disp, 25 Jarick (BD REGULAR BEVEL NEEDLES) 25 Darnelle x 5/8 Ndle For subcutaneous hormone injection. 25 each 0    nifedipine 0.3% lidocaine 1.5% in petrolatum ointment Apply a pea-size amount to anal area 3 times a day 100 g 0    omalizumab (XOLAIR) 150 mg/mL syringe Inject 2 mL (300 mg total) under the skin every twenty-eight (28) days. 2 mL 12    pantoprazole (PROTONIX) 20 MG tablet Take 1 tablet (20 mg total) by mouth daily. 30 tablet 1    prucalopride 2 mg Tab Take 1 tablet (2 mg total) by mouth daily. 90 tablet 0    safety needles (BD SAFETYGLIDE NEEDLE) 18 Montrice x 1 1/2 Ndle For drawing hormone injection 25 each 0    syringe, disposable, (EASY TOUCH LUER LOCK SYRINGE) 1 mL Syrg Use for weekly hormone injection. 4 each 0    syringe, disposable, 2.5 mL Syrg Use weekly 30 Syringe 2    testosterone cypionate (DEPOTESTOTERONE CYPIONATE) 200 mg/mL injection Inject 0.2 mL (40 mg total) under the skin every seven (7) days. 2 mL 0    triamcinolone (KENALOG) 0.1 % cream Apply topically Two (2) times a day. 15 g 0     Current Facility-Administered Medications   Medication Dose Route Frequency Provider Last Rate Last Admin    omalizumab [name]) injection 300 mg  300 mg Subcutaneous Q28 Days [name], [name], [name]   300 mg at 05/24/22 1138       Imaging/Tests:     Lab Results   Component Value Date    PLT 272 11/19/2021     Lab Results   Component Value Date    CREATININE 0.87 05/26/2022     Lab Results   Component Value Date    ALKPHOS 103 05/26/2022    BILITOT 0.5 05/26/2022    PROT 7.0 05/26/2022    ALBUMIN 3.9 05/26/2022    ALT 17 05/26/2022    AST 22 05/26/2022     ROS:  General {ajlrosgen:46920}  Cardiovascular {aggROSCV:37303}  Gastrointestinal {aggROSGI:37304}  Skin {aggROSskin:37305}  Endocrine {aggROSendo:37306}  Musculoskeletal {aggROSmusk:37307}  Neurologic {aggROSneu:37308}  Psychiatric {aggROSpsych:37309}        PHYSICAL EXAM:  LMP 07/20/2017 (Exact Date)     Wt Readings from Last 3 Encounters:   08/05/22 74 kg (163 lb 3.2 oz)   06/24/22 75.3 kg (166 lb)   06/06/22 75.8 kg (167 lb)     GENERAL:  Well developed, well-nourished {loboncobese:42394} and is in {BLANK ZOXWRU:04540} distress. The patient is pleasant and interactive. Patient is a good historian.  No evidence of sedation or intoxication.    HEENT: Normocephalic/atraumatic.   CARDIOVASCULAR:  {ajlcardiacexam:61906}  RESPIRATORY:  {ajlpulmexam:61907::Normal work of breathing, no supplemental 02}  EXTREMITIES: No clubbing, cyanosis noted.  NEUROLOGIC:  Alert and oriented, speech fluent, normal language. Cranial nerves grossly intact.  Sensation {ajlneuroexam:62877}. Reflexes were {ajlreflexes:69631} and {were/were not:54765} equal in upper and lower extremities  MUSCULOSKELETAL:  Motor function  {Pain Motor Numbers:563 [id number] 0061::5/5} in {ajlupperlower:48905::upper,lower} extremities.  Patient rises from a seated position with {ajlambulate3:46823::no} difficulty.  The patient was able to ambulate with {ajlambulate3:46823::no} difficulty throughout the clinic today {With/without:5700} the assistance of a walking aid-{ajlcane:46931::None}.   Paraspinal tenderness to palpation {present/not present:25705} in the {Cervical Thoracic Lumbar:47568} spine.   Muscles {WERE / WERE NOT:19253} in spasm/tight.  SKIN: No obvious rashes, lesions, or erythema.  PSY: Appropriate, full range affect, no psychomotor retardation      Medical Decision Making    Problems Addressed:  1 or more chronic illness w/ exacerbation, progression, or side effects of treatment (moderate)-myofascial pain improved, trochanteric bursitis worse    Amount/Complexity:  Reviewed external records:  Vascular surgery return 05/19/22. Fam med 05/26/22.  SPorts med 05/27/22-Left GTB CSI. and  PDMP (today on day of visit)  Reviewed results: Labs 05/26/22-Creatinine and LFTs normal.  Ordered tests: None  Assessment requiring independent historian: None  Discussion of management/test results with medical professional: None  Independent interpretation of test: None    Risk  Moderate: Prescription Drug Management, Minor surgery w/ risk factors, Elective Major Surgery w/o risk factors, Diagnosis/treatment significantly limited by social determinants of health-discussed medication management and changes, no prescriptions needed today.

## 2022-08-09 ENCOUNTER — Telehealth
Admit: 2022-08-09 | Discharge: 2022-08-10 | Payer: PRIVATE HEALTH INSURANCE | Attending: Anesthesiology | Primary: Anesthesiology

## 2022-08-09 DIAGNOSIS — G540 Brachial plexus disorders: Principal | ICD-10-CM

## 2022-08-09 DIAGNOSIS — G894 Chronic pain syndrome: Principal | ICD-10-CM

## 2022-08-09 DIAGNOSIS — M7918 Myalgia, other site: Principal | ICD-10-CM

## 2022-08-09 DIAGNOSIS — M7072 Other bursitis of hip, left hip: Principal | ICD-10-CM

## 2022-08-09 MED ORDER — GABAPENTIN 250 MG/5 ML ORAL SOLUTION
Freq: Three times a day (TID) | ORAL | 2 refills | 30 days | Status: CP
Start: 2022-08-09 — End: ?
  Filled 2022-09-07: qty 1080, 30d supply, fill #0

## 2022-08-09 MED FILL — XOLAIR 150 MG/ML SUBCUTANEOUS SYRINGE: SUBCUTANEOUS | 28 days supply | Qty: 2 | Fill #6

## 2022-08-09 NOTE — Unmapped (Signed)
I spent 15 minutes on the real-time audio and video with the patient. I spent an additional 19 minutes on pre- and post-visit activities.   The patient consented to this consult.    The patient was physically located in [place] or a state in which I am permitted to provide care. The patient understood that s/he may incur co-pays and cost sharing, and agreed to the telemedicine visit. The visit was completed via phone and/or video, which was appropriate and reasonable under the circumstances given the patient's presentation at the time.    The patient has been advised of the potential risks and limitations of this mode of treatment (including, but not limited to, the absence of in-person examination) and has agreed to be treated using telemedicine. The patient's/patient's family's questions regarding telemedicine have been answered. No vitals or physical exam was performed but the previous exam was copied forward in this note for continuity.     If the phone/video visit was completed in an ambulatory setting, the patient has also been advised to contact their provider???s office for worsening conditions, and seek emergency medical treatment and/or call 911 if the patient deems either necessary.    Visit modifiers:   POS 02 and 95 (virtual visit with video)    -Location of patient during visit: Home, [place]  -Provider location: clinic, [place]  -Names of all people present during visit: [facility] Waters, Carolynne Edouard, MD                        Department of Anesthesiology  [facility]  [address]  [place], [place] [address]  [phone]    Chronic Pain Follow-Up Note  1. Myofascial pain    2. TOS (thoracic outlet syndrome)    3. Bursitis of left hip, unspecified bursa    4. Chronic pain syndrome      Assessment and Plan  Vincent Waters is a 42 y.o. 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 Gayla Doss in May 2023.  He was first seen by our clinic in March 2023 for diagnostic injections.  His range of motion has an increased postoperatively, but still has tight, spasming pain in the right neck and shoulder.  He is currently weaning off benzodiazepines, discontinued opioid medications in late 2023 which he was chronically on for 3 years, and inquires about optimizing medications and procedures to improve his baseline pain at this time.  His primary pain generator includes myofascial pain post-TOS decompression.  He has other medical history that includes anxiety, scleroderma, migraines, autoimmune autonomic neuropathy, asthma.  Also follows with sports medicine and has undergone injections there as well.      February 2024  At last visit in December, patient presented reporting substantial improvement in pain, in which he attributed to starting PT and having TPIs on 05/10/22. He reported the TPI's brought his pain from a 9 down to a 5. He was currently taking gabapentin 600 mg TID and Motrin 400-800 mg nightly prn with benefit. We advised patient not increase his NSAIDs use (per GI team recs) and patient understood to be cautious. The patient was currently working on tapering his Klonopin with outside provider. He was hesitant to take Baclofen due to not being a fan of muscle relaxant and noted taking sparingly. He recently had left GTB injection with sports  med and updated imaging for left hip bursitis. We would continue his regimen without changes. Patient had repeat TPIs scheduled for next week.     Today, presents for a video visit due to possible viral illness.  Overall is doing OK.  Had a minor setback with PT as they were focusing on strength, but having a little more spasms.  So now they are working on both stretching/ROM and strength, which we discussed finding that balance is important.  He finds significant benefit from the trigger point injections, which then help him participate in PT and HEP.  Continues to have hip issues, which he notes has been going on for years.   He has had GTB injections with minimal relief.  They are adding in some hip stuff at PT, and we discussed HEP and I will provide specific stretches in his AVS.  We discussed at next TPI visit can do a good exam and if things are still persisting, can order an MRI to evaluate for tears or tendonosis that has been resistant to treatment.        Thoracic Outlet Syndrome - Chronic Pain Syndrome   - Continue following with PT  - Schedule repeat TPI's   - Continue gabapentin 600 mg oral solution TID, we will formally take this over, refilled  - Continue clonazepam taper with PCP    Left Trochanteric Bursitis   Follows with [facility] and recently underwent cortisone injection on 12/8 with 2 days of relief. Hip XR at that time showed minimal bilateral SI joint arthritis, unchanged from prior.  - Could consider MRI hip or further imaging to r/o muscle tear & other injuries given chronic nature of this pain.  Provided stretches in AVS.  Should pain persist, will do physical exam     We are delivering comprehensive, continuous, longitudinal care for this patient with chronic pain.     Considerations for the future:  -Consider initiation of Butrans/buprenorphine product after weaning off of benzodiazepines and pain psych evaluation for COM should conservative measures fail, but hoping to avoid if possible  -Consider up titration of gabapentin oral solution or rotation to Lyrica   -Hip MRI without contrast    Urine toxicology: 04/28/22  Urine toxicology screen was appropriate.   Last Opioid Change: Last Rx in 04/28/22  Last EKG: N/A  Previous Compliance Issues: None  Naloxone ordered: no  Total morphine equivalents: 0  Benzodiazepine: Yes. Weaning.  [place]CC[place] database was reviewed 08/08/22     No follow-ups on file.    Orders Placed This Encounter   Procedures    Trigger Point Injs 3+ Muscles (81191)     Melchor Kirchgessner  Quad proc or spine center  TPI visit  No driver needed.  Has serial consent.     Standing Status:   Future     Standing Expiration Date:   08/10/2023     Requested Prescriptions     Signed Prescriptions Disp Refills    gabapentin (NEURONTIN) 250 mg/5 mL oral solution 1080 mL 2     Sig: Take 12 mL (600 mg total) by mouth Three (3) times a day.         HPI:  Vincent Waters is seen in consultation at the request of Dr. Gayla Doss for evaluation and recommendations regarding His pain.     Patient was last seen in December. Since last visit, the patient has followed with Urology, Plastic Surgery, Family Medicine, and PT. The patient underwent TPIs of right trapezius, rhomboids,  cervical paraspinals, levator scapulae, teres minor with our clinic on 07/22/22.    Today,   Has the flu  Had a minor setback.  Had PT 2/16  Iniitially was working on strength, but now working on ROM again.  Feels like its a step back, but overall he's further along than expected.  Now balancing ROM exercises and strength.    Feels like things really tightened back up.  Not sure if he laid on it wrong.  No other new pain  He was talking to physical therapist about his hip issues.  Had an injection L (GTB) with Dr. Nedra Hai.  Helped for a short time but wore off.  Kicked in in about 2 weeks. Lasted around a month.  60%.  They may start to work on this at PT  Has been using voltaren gel on the area.  Trying to get in to a GI doctor for long term stomach issues. Previously on reglan, was giving him TD.  Trigger point injections, very helpful.  Really help him get through PT.    Current Medications:  Gabapentin 600 mg TID- has been on for years.  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  Voltaren gel        Previous Medication Trials:  NSAIDS- ibuprofen  Antidepressants-   Anticonvulsant- gabapentin  Muscle relaxants- flexeril, baclofen  Topicals-voltaren gel  Short-acting opiates-percocet   Long-acting opiates-   Anxiolytics- Other-     Previous Interventions  Medications, PT, surgery, injections with sports med  TOS decompression/PMR 11/16/21  TPI-05/10/22-very helpful.  PT-2023  Left GTB CSI-sports med 05/27/22      Allergies as of 08/09/2022 - Reviewed 08/05/2022   Allergen Reaction Noted    Latex Rash and Hives 09/16/2016    Penicillins Anaphylaxis 09/16/2016    Shellfish containing products Anaphylaxis 01/23/2017    Other Rash 12/16/2020    Latex, natural rubber Rash 08/20/2015    Penicillin Rash 02/14/2018      Current Outpatient Medications   Medication Sig Dispense Refill    albuterol 2.5 mg /3 mL (0.083 %) nebulizer solution Inhale 3 mL (2.5 mg total) by nebulization every six (6) hours as needed for wheezing or shortness of breath. 90 mL 1    azelastine (ASTELIN) 137 mcg (0.1 %) nasal spray Instill 2 sprays into each nostril two (2) times a day as needed for rhinitis. 30 mL 1    baclofen (LIORESAL) 10 MG tablet Take 0.5-1 tablets (5-10 mg total) by mouth two (2) times a day as needed for muscle spasms. 60 tablet 1    budesonide-formoteroL (SYMBICORT) 160-4.5 mcg/actuation inhaler Inhale 1 puff by mouth Two (2) times a day. 10.2 g 5    cetirizine (ZYRTEC) 10 MG tablet Take 1 tablet (10 mg total) by mouth daily. 30 tablet 12    clindamycin (CLEOCIN T) 1 % lotion 1 Application as needed.      clonazePAM (KLONOPIN) 0.5 MG tablet Take half tablet (0.25 mg total) by mouth once daily as needed for anxiety. For the next two weeks, every other day, may take an additional half a tablet as needed. After two weeks, decrease to taking half a tablet once daily as needed, without the additional dose 30 tablet 0    dextroamphetamine-amphetamine (ADDERALL) 20 mg tablet Take 1 tablet (20 mg total) by mouth daily as needed. 10 tablet 0    emtricitabine-tenofovir alafen (DESCOVY) 200-25 mg tablet Take 1 tablet by mouth daily. 90 tablet 0    EPINEPHrine (EPIPEN)  0.3 mg/0.3 mL injection Inject 0.3 mL (0.3 mg total) under the skin once for 1 dose. 2 each 0    famotidine (PEPCID) 40 MG tablet Take 1 tablet (40 mg total) by mouth every evening. 30 tablet 1    gabapentin (NEURONTIN) 250 mg/5 mL oral solution Take 12 mL (600 mg total) by mouth Three (3) times a day. 1080 mL 3    ibuprofen (MOTRIN) 800 MG tablet Take 1 tablet (800 mg total) by mouth every eight (8) hours as needed for pain. 60 tablet 2    inhalational spacing device Spcr Use as directed with albuterol and symbicort 1 each 1    nebulizer and compressor (COMP-AIR NEBULIZER COMPRESSOR) Devi 1 each by Miscellaneous route nightly as needed. 1 each 0    needle, disp, 25 Mayo (BD REGULAR BEVEL NEEDLES) 25 Yaelis x 5/8 Ndle For subcutaneous hormone injection. 25 each 0    nifedipine 0.3% lidocaine 1.5% in petrolatum ointment Apply a pea-size amount to anal area 3 times a day 100 g 0    omalizumab (XOLAIR) 150 mg/mL syringe Inject 2 mL (300 mg total) under the skin every twenty-eight (28) days. 2 mL 12    pantoprazole (PROTONIX) 20 MG tablet Take 1 tablet (20 mg total) by mouth daily. 30 tablet 1    prucalopride 2 mg Tab Take 1 tablet (2 mg total) by mouth daily. 90 tablet 0    safety needles (BD SAFETYGLIDE NEEDLE) 18 Khrystina x 1 1/2 Ndle For drawing hormone injection 25 each 0    syringe, disposable, (EASY TOUCH LUER LOCK SYRINGE) 1 mL Syrg Use for weekly hormone injection. 4 each 0    syringe, disposable, 2.5 mL Syrg Use weekly 30 Syringe 2    testosterone cypionate (DEPOTESTOTERONE CYPIONATE) 200 mg/mL injection Inject 0.2 mL (40 mg total) under the skin every seven (7) days. 2 mL 0    triamcinolone (KENALOG) 0.1 % cream Apply topically Two (2) times a day. 15 g 0     Current Facility-Administered Medications   Medication Dose Route Frequency Provider Last Rate Last Admin    omalizumab [name]) injection 300 mg  300 mg Subcutaneous Q28 Days [name], [name], [name]   300 mg at 05/24/22 1138       Imaging/Tests:     Lab Results   Component Value Date    PLT 272 11/19/2021     Lab Results   Component Value Date    CREATININE 0.87 05/26/2022     Lab Results   Component Value Date    ALKPHOS 103 05/26/2022    BILITOT 0.5 05/26/2022    PROT 7.0 05/26/2022    ALBUMIN 3.9 05/26/2022    ALT 17 05/26/2022    AST 22 05/26/2022     ROS:  Per HPI        PHYSICAL EXAM:  LMP 07/20/2017 (Exact Date)     Wt Readings from Last 3 Encounters:   08/05/22 74 kg (163 lb 3.2 oz)   06/24/22 75.3 kg (166 lb)   06/06/22 75.8 kg (167 lb)     GENERAL: Well developed, well-nourished male and is in no apparent distress. The patient is pleasant and interactive. Patient is a good historian.  No evidence of sedation or intoxication.    HEENT: Normocephalic/atraumatic.   RESPIRATORY:  Normal work of breathing, no supplemental 02  PSY: Appropriate, full range affect, no psychomotor retardation

## 2022-08-09 NOTE — Unmapped (Addendum)
Today we did the following :  -It was good to see you    -If we have prescribed you a medication, be prepared that your insurance company may require additional paperwork (even for generic medication), typically called a prior authorization.  This sometimes can delay the prescription, but know that our office is very efficient at taking care of these.  If it is significantly delayed, you may call the pharmacy for updates, or call the office.    -We will get you set up for repeat trigger point injections.  We can do a hip exam and consider an MRI.    -Continue gabapentin, refilled    -Continue physical therapy.  It will be a balance between stretching/range of motion and strength training.    -3 month clinic follow pu.    -Please plan to arrive to all appointments at least 30 minutes prior to your scheduled appointment time. Failure to do so may delay your visit.    MyChart Messages:  Please use MyChart for non-urgent symptoms or matters such as general questions, non-urgent prescription refills, or non-urgent scheduling issues. For your safety and best  care please DO NOT use MyChart messages to report emergent or urgent symptoms as messages are only checked during regular business hours.  These messages are checked by the nurses during normal business hours 8:30 am-4:30 pm Monday-Friday every 24-48 hours and are for non-urgent, non-emergent concerns. You may be asked to return for a follow up visit if it is deemed your questions are best handled in the clinic setting.  Please call our nurse line below for more time sensitive issues. If our office is closed you can contact the hospital operator to reach the on call provider available for urgent/emergent issues only.     -Because of the high volume of calls we receive and the high demand for our clinical services, we are occupied all day providing care for patients in the clinic. This leaves little time to respond to phone calls, and we are generally unable to discuss patient care advice over the telephone. If you are experiencing a medication side effect or complication, you can call and let us know, but we will typically not make a medication substitution or change over the telephone.     We are generally unable to respond acutely to a flare up of pain, as this is quite common in our patients and needs to be dealt with as part of the long term management plan. Please make an appointment with [place] if you wish to discuss a matter in any detail. Should you still need to call, please do so at [phone].       Thank you for choosing [facility]. It was a pleasure to see you in clinic today. Please contact us with any questions or concerns at [phone].     Criss Rosales, MD    08/09/2022  Anesthesiologist and Pain Management Provider  [facility]    -If you or someone you know is having a mental health emergency, you can dial [phone] (instead of 911), for the mental health emergency line.      What are common side effects from pain medications you may be taking?    Do not drive or do anything with responsibility until you know you are tolerating a new medication.    Many pain type medications can be sedating, especially in combination with others.  They can also decrease your breathing especially in combination with other medications.  If a medication is not effective for you, talk to [place] about weaning off (do not stop abruptly on your own), so we do not just continue it.    Only take medications as prescribed.  If a medication is prescribed as needed, it means you can use it as you need it and do not have to take it every day.  Do not ever take more than what is prescribed to you for any medication.  Not only could this be dangerous, but if you do not take it as prescribed we may have to discontinue it due to safety concerns.  If you feel like your medications are not controlling your medications, do not adjust the medication on your own.  Please discuss at your next clinic visit and we can determine a plan.    Opioids/Narcotics (Examples: oxycodone, hydrocodone, tramadol, morphine, hydromorphone, fentanyl patch)  Narcan is now available Over the Counter (OTC), meaning you can pick it up at many pharmacies without a prescription.  The cost is around $45.    ?? Constipation  ?? Itching  ?? Nausea  ?? Dizziness or lightheadedness  ?? Drowsiness or sedation  ?? Decreased breathing   ?? Addiction     Anticonvulsants (Examples: Gabapentin, Pregabalin, Carbamazepine, topiramate)  New warning in 2019 that these medications in combination with opioids can cause respiratory depression.  ?? Dizziness or lightheadedness  ?? Drowsiness or sedation  ?? Nausea  ?? Leg swelling     Antidepressants (Examples: duloxetine, amitriptyline, nortriptyline, venlafaxine)  ?? Dizziness or lightheadedness  ?? Drowsiness or sedation  ?? Constipation  ?? Urinary retention  ?? Dry mouth  ?? Nausea  ?? Difficulty falling asleep     NSAIDS (Examples: Ibuprofen, naproxen, meloxicam, celecoxib)  -The FDA put out guidelines in 2015 that NSAIDs should not be used regularly due to safety concerns.  OK to use a few times a month, or for 1-2 weeks for a flare of pain.  If you take NSAIDs everyday, we should discuss this and stop it due to safety.  ?? Bleeding  ?? Stomach ulcers  ?? Kidney problems   -Heart Attack  -Stroke     Muscle relaxants (Examples: cyclobenzaprine, tizanidine, baclofen)  ?? Dizziness or lightheadedness  ?? Drowsiness or sedation  ?? Nausea  ?? Low blood pressure  ?? Dry mouth     Many pain medications can lead to a rare, potentially dangerous condition called serotonin syndrome. Symptoms can include tremors, agitation, fever, muscle rigidity/contractions, diarrhea, excessive sweating, palpitations and high blood pressure. If these symptoms arise, please contact us or report to the emergency room for further management.      Your pharmacist can answer any questions you may have in regard to all your medications or possible interactions.

## 2022-08-14 DIAGNOSIS — F1320 Sedative, hypnotic or anxiolytic dependence, uncomplicated: Principal | ICD-10-CM

## 2022-08-14 DIAGNOSIS — F902 Attention-deficit hyperactivity disorder, combined type: Principal | ICD-10-CM

## 2022-08-14 MED ORDER — DEXTROAMPHETAMINE-AMPHETAMINE 20 MG TABLET
ORAL_TABLET | Freq: Every day | ORAL | 0 refills | 10 days | PRN
Start: 2022-08-14 — End: 2022-09-13

## 2022-08-14 MED ORDER — CLONAZEPAM 0.5 MG TABLET
ORAL_TABLET | 0 refills | 0 days
Start: 2022-08-14 — End: ?

## 2022-08-15 MED ORDER — CLONAZEPAM 0.5 MG TABLET
ORAL_TABLET | 0 refills | 0 days | Status: CP
Start: 2022-08-15 — End: ?
  Filled 2022-08-22: qty 30, 30d supply, fill #0

## 2022-08-15 MED ORDER — DEXTROAMPHETAMINE-AMPHETAMINE 20 MG TABLET
ORAL_TABLET | Freq: Every day | ORAL | 0 refills | 10 days | Status: CP | PRN
Start: 2022-08-15 — End: 2022-09-14
  Filled 2022-08-22: qty 10, 10d supply, fill #0

## 2022-08-19 NOTE — Unmapped (Signed)
Called and left voicemail:  Appt date: Tuesday, 08/23/22  Appt time: 1100 am advised to arrive 30 mins prior to appt. @ 1030 am  Physician: Dr Oda Kilts  Location: [phone]   Clinic phone number: [phone]  Driver? no  Advised to call clinic if experiencing COVID symptoms, signs of infection or recent use of antibiotics or steroids.

## 2022-08-23 ENCOUNTER — Ambulatory Visit
Admit: 2022-08-23 | Discharge: 2022-08-24 | Payer: PRIVATE HEALTH INSURANCE | Attending: Anesthesiology | Primary: Anesthesiology

## 2022-08-23 DIAGNOSIS — M7918 Myalgia, other site: Principal | ICD-10-CM

## 2022-08-23 DIAGNOSIS — Z789 Other specified health status: Principal | ICD-10-CM

## 2022-08-23 DIAGNOSIS — M25552 Pain in left hip: Principal | ICD-10-CM

## 2022-08-23 MED ORDER — TESTOSTERONE CYPIONATE 200 MG/ML INTRAMUSCULAR OIL
SUBCUTANEOUS | 0 refills | 70 days | Status: CP
Start: 2022-08-23 — End: 2022-11-01
  Filled 2022-09-07: qty 2, 56d supply, fill #0

## 2022-08-23 MED ADMIN — bupivacaine HCl (MARCAINE) 0.25 % (2.5 mg/mL) injection 50 mg: 20 mL | @ 16:00:00 | Stop: 2022-08-23

## 2022-08-23 NOTE — Unmapped (Addendum)
Trigger Point Injection of 3+ muscle groups: right trapezius, rhomboids, cervical paraspinals, levator scapulae, teres minor  Pre-operative diagnosis: myofascial pain  Post-operative diagnosis: Same    Attending Physician: Dr. Criss Rosales  Assistant: Dr. Marcelle Smiling    Vincent Waters is a 42 y.o. 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 Gayla Doss in May 2023.  He was first seen by our clinic in March 2023 for diagnostic injections.  His range of motion has an increased postoperatively, but still has tight, spasming pain in the right neck and shoulder.  He is currently weaning off benzodiazepines, discontinued opioid medications which he was chronically on for 3 years, and inquires about optimizing medications and procedures to improve his baseline pain at this time.  His primary pain generator includes myofascial pain post-TOS decompression. Patient presents with worsened pain localized to right neck and shoulder area. He denies radiation of pain down his RUE. Patient presents for TPI injections today.    08/23/2022 - Last TPI in February.  Overall doing well.  Has been working with PT still, and they are shifting to more light-weight based treatment.  Requesting same areas as these have been helpful but are increasingly short lived.     The bulk of the pain is localized to the trapezius, we can consider performing a right-sided spinal accessory nerve block with steroid in the future.     Left hip pain - undergoing serial cortisone injections for GTB on the left with diminishing benefit. On physical exam today, there were no obvious deformities, hip drop with ambulation, abnormal pelvic tilt, there was tenderness to palpation at the left PSIS, greater trochanter - though the maximal point of tenderness was 2 inches below the greater trochanter; + FABER, +FADIR, increase in pain with lateral force of the left leg with resistance in the area between the lateral ilium to the mid-thigh; sensation to light touch intact bilaterally; TTP to palpation over the left piriformis muscle.     Given the patient's exam findings alluding to multiple/overlapping etiologies of pain generation while being refractory to physical therapy, medications, and corticosteroid injections - we discussed ordering an MRI of the hip/pelvis without contrast to further target pain generators.     Serial consent obtained 05/10/22.    After risks and benefits were explained including bleeding, infection, worsening of the pain, damage to the area being injected, weakness, allergic reaction to medications, vascular injection, pneumothorax and nerve damage, signed consent was obtained.  All questions were answered.   The patient is not taking antiplatelet or anticoagulation medications and does not have a driver today.    The area of the trigger point was identified and the skin prepped with chloroprep.  Next, a 27 Rodolph 0.5 inch needle was placed in the area of the trigger point.  Dry needling was performed in the area of the trigger point.  Once reproduction of the pain was elicited and negative aspiration confirmed, the trigger point was injected with 0.5-1 ml of 0.25% bupivacaine and the needle removed.      The patient did tolerate the procedure well and there were not complications.      Trigger point locations: right trapezius, rhomboids, cervical paraspinals, levator scapulae, teres minor    The patient was monitored for 15 minutes after the procedure.  Vital signs remained normal and the patient ambulated out of clinic    Pre-procedure pain  score: 4/10  Post-procedure pain score: 4/10    DISPO:  Patient has a clinic follow up 2/19.      Orders Placed This Encounter   Procedures    Trigger Point Injs 3+ Muscles [phone])     Lobonc, 15 minutes, repeat TPI, no driver, no PIV, spine or quad, 1 month     Standing Status:   Future Standing Expiration Date:   08/23/2023    MRI Lower Extremity Joint Bilateral Wo Contrast     Standing Status:   Future     Standing Expiration Date:   08/23/2023     Order Specific Question:   Which body part?     Answer:   Hip     Order Specific Question:   Reason for Exam:     Answer:   left hip pain refractory to GTB injections     Order Specific Question:   Is the patient pregnant?     Answer:   No     Order Specific Question:   What is the patient's sedation requirement?     Answer:   No Sedation     Order Specific Question:   Does the patient have metallic implants or external cardiac devices?     Answer:   Other     Comments:   unknown     Order Specific Question:   Performed at     Answer:   [facility]     Comments:   or [place] Photographer Specific Question:   Release to patient     Answer:   Immediate [1]

## 2022-08-23 NOTE — Unmapped (Signed)
POST PROCEDURE INSTRUCTIONS   You may apply an ice pack 20-30 minutes at a time to the injection site if you experience soreness.     Keep the injection site clean and dry. You make remove the band-aid one day following the procedure.     You may take a shower but AVOID getting in to baths, pools or whirlpools for 48 HOURS AFTER THE PROCEDURE.       ACTIVITY   Refrain from heavy activity for the next 24 to 48 hours. General walking is okay. You may resume your normal activities the day following the procedure.   You may start or resume your individualized exercise program or physical therapy 48 hours after the procedure.  IF YOU'VE HAD TRIGGER POINT INJECTIONS, YOU MAY CONTINUE EXERCISE OR PHYSICAL THERAPY WITHOUT DELAY.  MEDICATIONS   Please note that it is okay to continue other prescribed medications (blood pressure, insulin, water pill, depression/anxiety pill, etc.) as well as other prescribed pain medications such as Neurontin, Lyrical, Celebrex, Ultram, Vicodin, Norco and acetaminophen (Tylenol).   SIDE EFFECTS   Increase in pain during the first 24 to 48 hours.     You might experience:   1. Mild to moderate swelling at the joint.   2. Possible bruising at the injection site.     WHEN TO CALL THE DOCTOR/NURSE   Severe pain, worse or different that the pain you had before the procedure.     Fever or chills.     Redness, or swelling around the injection site.     Call the Pain Management  Procedural nurses [phone],  during normal business hours (7 am-3 pm). If it is AFTER HOURS or during a weekend or holiday, call the hospital operator and ask for the Anesthesia Pain physician on call at [phone].   FOR EMERGENCIES, CALL 911 OR GO TO THE NEAREST HOSPITAL EMERGENCY DEPARTMENT.   ?   TO SCHEDULE APPOINTMENTS OR FOR QUESTIONS RELATED TO MEDICATIONS   Call the Pain Management Clinic at [phone]

## 2022-08-23 NOTE — Unmapped (Signed)
AVS reviewed with patient and he verbalized understanding to all.

## 2022-08-29 DIAGNOSIS — M25552 Pain in left hip: Principal | ICD-10-CM

## 2022-09-02 NOTE — Unmapped (Signed)
[facility] AT [facility] [place]  OUTPATIENT PHYSICAL THERAPY  09/02/2022  Note Type: Treatment Note       Patient Name: Vincent Waters  Date of Birth:[birth year]  Diagnosis:   Encounter Diagnoses   Name Primary?    Neck pain on right side Yes    Chronic right shoulder pain     Shoulder stiffness, right     Thoracic outlet syndrome        Referring MD:  Dawna Part, *     Date of Onset of Impairment-09/18/2016  Date PT Care Plan Established or Reviewed-05/06/2022  Date PT Treatment Started-05/06/2022   Plan of Care Effective Date:          Assessment/Plan:    Assessment  Assessment details:    Yazeed's right shoulder ROM and strength are actually pretty good at this point.  However, he continues to have some posterior and depressor weakness, which is causing his shoulder to sit in a forward and elevated position.  This seems to be resulting in a right upper trapezius/spasm.  Focused on manual therapy to release this restriction (excellent twitches with TDN), and afterwards, he felt much better and the ball in his upper trapezius had release ~ 75%.  I think if we can mitigate his upper trapezius trigger point, it will give him pain relief, but if we can address the real cause (muscular imbalance that is causing constant upper trapezius over activation), symptoms should improve significantly.  He agrees with this plan.  Written instructions given for chair press.  He committed to performing them consistently as part of his home exercise program. All questions answered.  This patient requires skilled physical therapy services to address the outlined impairments in order to return to his desired level of function.           Impairments: decreased strength, hypertonicity, impaired ADLs, muscular restrictions, impaired flexibility, impaired sensation, joint restriction, pain, postural weakness and trigger points                      Therapy Goals      Goals:      Short Term Goals:  In 6 weeks:  Goal #1: Patient will be independent with progressively monitored home exercise program.   Baseline/Current: Patient is unfamiliar with home exercise program.     Goal #2: Patient will demonstrate cervical ROM within normal limits with minimal symptoms in order to allow for functional activities such as safe driving and looking before crossing the street.   Baseline/Current: Patient demonstrates painful and limited cervical ROM (see ROM testing).     Goal #3: Patient will demonstrate right shoulder range of motion within normal limits with minimal symptoms for proper glenohumeral joint mechanics in order to allow for daily functional tasks.   Baseline/Current: Patient has painful and limited right shoulder range of motion (see ROM testing).     Goal #4: Patient will demonstrate improved posture without cuing.  Baseline/Current: Patient has slumped posture and rounded shoulders.    Long Term Goals:  In 12 weeks:  Goal #1: Patient will demonstrate right shoulder strength within normal limits with minimal symptoms in order to allow for pain free lifting and reaching with right upper extremity.   Baseline/Current: Patient has painful and limited right shoulder strength (see manual muscle testing).     Goal #2: Patient will be able to reach over head with minimal symptoms in order to place/remove an object over shoulder height in  a cabinet.  Baseline/Current: Patient has difficulty reaching over head at present.    Goal #3: Patient will return to recreational activities (working out in gym) without limit.  Baseline/Current: Patient unable to do any upper body exercises at present.    Goal #4: Patient's FOTO score will improve from 34 at initial evaluation to >/= 52 at discharge in order to demonstrate a significant improvement in self-reported outcome measures that directly result from his course of physical therapy.   Baseline/Current: FOTO = 34    Plan    Therapy options: will be seen for skilled physical therapy services    Planned therapy interventions: Body Mechanics Training, Dry Needling, Education - Patient, Home Exercise Program, Manual Therapy, Neuromuscular Re-education, Postural Training, Therapeutic Activities and Therapeutic Exercises      Frequency: 1x week    Duration in weeks: 12    Education provided to: patient.    Education provided: HEP, Body mechanics, Importance of Therapy, Treatment options and plan, Symptom management and Body awareness    Education results: verbalized good understanding, demonstrates understanding and needs further instruction.    Communication/Consultation: N/A.    Next visit plan:        Manual therapy to promote improved SterC, AcroC, GHJ motion and loosening muscles, stretching, AAROM, postural/shoulder strengthening    Total Session Time: 45    Treatment rendered today:      Therapeutic Exercise: 15  minutes  - Patient Education: Home Exercise Program With Handout, Plan of Care, TDN  - Seated Scapular Depression    Manual Therapy: 30 minutes  - Right Upper Trapezius Pin and Stretch  - Acupressure right upper trapezius  - PROM Horizontal Abduction @ 90/90 with GHJ PA  Intramuscular Therapy: 5 minutes  [facility]  was informed of the risks associated with dry needling and he signed consent form.  Patient screened for any contraindications to needling including pregnancy, infection, antibiotic use, anticoagulants use, autoimmune issues, uncontrolled diabetes, and active cancer/treatment and does not have any of these conditions.  DRY NEEDLING FOR RELEASING IDENTIFIED TRIGGER POINTS TO FACILITATE MANUAL THERAPY TECHNIQUES AND ALLOW MUSCLE TO RETURN TO OPTIMAL LENGTH (NO CHARGE).  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 Seper is a 42 y.o. right hand dominant adult who presents to outpatient physical therapy with right neck and shoulder pain that began in early 2018 when he was changing the oil in his car when it fell onto his sternum.  He went to the ER and got an x-ray which showed a sternal fracture.  Since then, he has had persistent issues and nerve pressure in his right neck/shoulder which led him to get a right anterior scalenectomy, middle scalenectomy, first rib resection and right Pectoralis minor release surgery performed on 11/16/2021 by Thora Lance, MD.  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    Date of Onset:  09/18/2016  Date is an approximation?: yes  Explanation:  Patient reports onset of symptoms in early 2018.     Subjective:     Mr.  Badolato reports that his shoulder is feeling ok.  He continues to have a ball of muscle in his right shoulder is painful.  He has been doing his exercises consistently.    Pain  Current pain rating: 2  Location: right anterior/superior shoulder  Quality: tight and discomfort  Progression: improved          Diagnostic Tests    Diagnostic Test Comments:                     Objective:     Static Posture Assessment   Shoulders-Rounded.    Palpation     Right Shoulder   Hypertonic in the pectoralis major, pectoralis minor and upper trapezius.   Tenderness of the upper trapezius.   Trigger point to upper trapezius.     Right Cervical/Thoracic   Hypertonic in the pectoralis major, pectoralis minor and upper trapezius.   Tenderness of the upper trapezius.   Trigger point to upper trapezius.     Joint Mobility  Shoulder  Right Shoulder Joint Mobility    Posterior capsule: hypomobile    Strength  Right Shoulder   Planes of Motion   Flexion: 4-   Extension: 4-   Abduction: 4- External rotation at 0??: 4+   Internal rotation at 0??: 4+     General Comments   Shoulder Comments  Right shoulder sitting up and forward.                                  I attest that I have reviewed the above information.  Signed: Patton Salles, PT  09/02/2022 8:12 AM

## 2022-09-07 MED ORDER — EASY TOUCH LUER LOCK SYRINGE 1 ML
0 refills | 0 days
Start: 2022-09-07 — End: ?

## 2022-09-07 MED ORDER — BD ECLIPSE 25 GAUGE X 1" NEEDLE
0 refills | 0 days
Start: 2022-09-07 — End: ?

## 2022-09-07 MED FILL — EASY TOUCH LUER LOCK SYRINGE 1 ML: 84 days supply | Qty: 12 | Fill #0

## 2022-09-07 MED FILL — BD ECLIPSE 25 GAUGE X 1" NEEDLE: 84 days supply | Qty: 12 | Fill #0

## 2022-09-12 NOTE — Unmapped (Signed)
[facility] Refill Coordination Note    Vincent Waters, Vincent Waters: 1981/05/08  Phone: [phone] (home) [phone] (work)      All above HIPAA information was verified with patient.         09/12/2022     3:45 PM   Specialty Rx Medication Refill Questionnaire   Which Medications would you like refilled and shipped? Xolair   Please list all current allergies: Latex penicclin shellfish   Have you missed any doses in the last 30 days? No   Have you had any changes to your medication(s) since your last refill? No   How many days remaining of each medication do you have at home? 0   If receiving an injectable medication, next injection date is 09/20/2022   Have you experienced any side effects in the last 30 days? No   Please enter the full address (street address, city, state, zip code) where you would like your medication(s) to be delivered to. [address] [place]  [place] [address]   Please specify on which day you would like your medication(s) to arrive. Note: if you need your medication(s) within 3 days, please call the pharmacy to schedule your order at [phone]  09/14/2022   Has your insurance changed since your last refill? No   Would you like a pharmacist to call you to discuss your medication(s)? No   Do you require a signature for your package? (Note: if we are billing Medicare Part B or your order contains a controlled substance, we will require a signature) No         Completed refill call assessment today to schedule patient's medication shipment from the [facility] [phone]).  All relevant notes have been reviewed.       Confirmed patient received a Conservation officer, historic buildings and a Surveyor, mining with first shipment. The patient will receive a drug information handout for each medication shipped and additional FDA Medication Guides as required.         REFERRAL TO PHARMACIST     Referral to the pharmacist: Not needed      [facility]     Shipping address confirmed in Epic.     Delivery Scheduled: Yes, Expected medication delivery date: 09/14/22.     Medication will be delivered via Same Day Courier to the prescription address in Epic WAM.    Darrold Bezek' [name] Specialty Technician

## 2022-09-14 MED FILL — XOLAIR 150 MG/ML SUBCUTANEOUS SYRINGE: SUBCUTANEOUS | 28 days supply | Qty: 2 | Fill #7

## 2022-09-16 DIAGNOSIS — M25552 Pain in left hip: Principal | ICD-10-CM

## 2022-09-20 ENCOUNTER — Ambulatory Visit: Admit: 2022-09-20 | Payer: PRIVATE HEALTH INSURANCE

## 2022-09-20 DIAGNOSIS — F902 Attention-deficit hyperactivity disorder, combined type: Principal | ICD-10-CM

## 2022-09-20 DIAGNOSIS — F1320 Sedative, hypnotic or anxiolytic dependence, uncomplicated: Principal | ICD-10-CM

## 2022-09-20 DIAGNOSIS — Z79899 Other long term (current) drug therapy: Principal | ICD-10-CM

## 2022-09-20 MED ORDER — DEXTROAMPHETAMINE-AMPHETAMINE 20 MG TABLET
ORAL_TABLET | Freq: Every day | ORAL | 0 refills | 10 days | PRN
Start: 2022-09-20 — End: 2022-10-20

## 2022-09-20 MED ORDER — CLONAZEPAM 0.5 MG TABLET
ORAL_TABLET | 0 refills | 0 days
Start: 2022-09-20 — End: ?

## 2022-09-20 MED ORDER — DESCOVY 200 MG-25 MG TABLET
ORAL_TABLET | Freq: Every day | ORAL | 0 refills | 90 days
Start: 2022-09-20 — End: ?

## 2022-09-21 ENCOUNTER — Ambulatory Visit
Admit: 2022-09-21 | Payer: PRIVATE HEALTH INSURANCE | Attending: Student in an Organized Health Care Education/Training Program | Primary: Student in an Organized Health Care Education/Training Program

## 2022-09-21 NOTE — Unmapped (Addendum)
Trigger Point Injection of 3+ muscle groups: right trapezius, rhomboids, cervical paraspinals, levator scapulae, teres minor  Pre-operative diagnosis: myofascial pain  Post-operative diagnosis: Same    Attending Physician: Dr. Criss Rosales  Assistant: Dr. Jerrell Mylar    Vincent Waters is a 42 y.o. 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 Gayla Doss in May 2023.  He was first seen by our clinic in March 2023 for diagnostic injections.  His range of motion has an increased postoperatively, but still has tight, spasming pain in the right neck and shoulder.  He is currently weaning off benzodiazepines, discontinued opioid medications which he was chronically on for 3 years, and inquires about optimizing medications and procedures to improve his baseline pain at this time.  His primary pain generator includes myofascial pain post-TOS decompression. Patient presents with worsened pain localized to right neck and shoulder area. He denies radiation of pain down his RUE. Patient presents for TPI injections today.    09/26/2022 - Last TPI in March.  Overall doing well.  Has been working with PT still and getting dry needling performed.  Trying to limit pushing activities per PT recommendations.    The bulk of the pain is localized to the trapezius, we can consider performing a right-sided spinal accessory nerve block with steroid in the future.     Serial consent obtained 05/10/22.    After risks and benefits were explained including bleeding, infection, worsening of the pain, damage to the area being injected, weakness, allergic reaction to medications, vascular injection, pneumothorax and nerve damage, signed consent was obtained.  All questions were answered.   The patient is not taking antiplatelet or anticoagulation medications and does not have a driver today.    The area of the trigger point was identified and the skin prepped with chloroprep.  Next, a 27 Marijke 0.5 inch needle was placed in the area of the trigger point.  Dry needling was performed in the area of the trigger point.  Once reproduction of the pain was elicited and negative aspiration confirmed, the trigger point was injected with 0.5-1 ml of 0.25% bupivacaine and the needle removed.      The patient did tolerate the procedure well and there were not complications.      Trigger point locations: right trapezius, rhomboids, cervical paraspinals, levator scapulae, teres minor    The patient was monitored for 15 minutes after the procedure.  Vital signs remained normal and the patient ambulated out of clinic    Pre-procedure pain score: 4/10  Post-procedure pain score: 4/10    DISPO:  Patient has a clinic follow up 4/29    Repeat TPI orders placed for 4 weeks      Orders Placed This Encounter   Procedures    Trigger Point Injs 3+ Muscles ([id number])     Lobonc, no piv or driver, 30 mins, 4 weeks     Standing Status:   Future     Standing Expiration Date:   09/26/2023

## 2022-09-22 MED ORDER — DEXTROAMPHETAMINE-AMPHETAMINE 20 MG TABLET
ORAL_TABLET | Freq: Every day | ORAL | 0 refills | 10 days | Status: CP | PRN
Start: 2022-09-22 — End: 2022-10-22
  Filled 2022-09-23: qty 10, 10d supply, fill #0

## 2022-09-22 MED ORDER — DESCOVY 200 MG-25 MG TABLET
ORAL_TABLET | Freq: Every day | ORAL | 0 refills | 90 days | Status: CP
Start: 2022-09-22 — End: ?
  Filled 2022-09-23: qty 90, 90d supply, fill #0

## 2022-09-22 MED ORDER — CLONAZEPAM 0.5 MG TABLET
ORAL_TABLET | 0 refills | 0 days | Status: CP
Start: 2022-09-22 — End: ?
  Filled 2022-09-23: qty 30, 30d supply, fill #0

## 2022-09-23 ENCOUNTER — Ambulatory Visit
Admit: 2022-09-23 | Payer: PRIVATE HEALTH INSURANCE | Attending: Rehabilitative and Restorative Service Providers" | Primary: Rehabilitative and Restorative Service Providers"

## 2022-09-23 ENCOUNTER — Ambulatory Visit
Admit: 2022-09-23 | Discharge: 2022-10-02 | Payer: PRIVATE HEALTH INSURANCE | Attending: Rehabilitative and Restorative Service Providers" | Primary: Rehabilitative and Restorative Service Providers"

## 2022-09-23 NOTE — Unmapped (Signed)
[facility] TH[facility]AP[facility] AT [facility]   OUTPATIENT PHYSICAL THERAPY  09/23/2022  Note Type: Treatment Note       Patient Name: Vincent Waters  Date of Birth:[birth year]  Diagnosis:   Encounter Diagnoses   Name Primary?   ??? Neck pain on right side Yes   ??? Chronic right shoulder pain    ??? Shoulder stiffness, right    ??? Thoracic outlet syndrome        Referring MD:  Dawna Part, *     Date of Onset of Impairment-09/18/2016  Date PT Care Plan Established or Reviewed-05/06/2022  Date PT Treatment Started-05/06/2022   Plan of Care Effective Date:          Assessment/Plan:    Assessment  Assessment details:    Vincent Waters has had a bit of a flare-up in his upper trapezius spasm, likely from being in a compressed position in the MRI and doing push-ups.  Discussed his current shoulder imbalance and that he should avoid push-ups/pressing exercises right now.  However, he can do pulling exercises (like rows, dead lift, reverse flys, inverted rows and pull-ups).  He agreed to do this.  Recommended that he hydrate, stretch and do his stretches to mitigate post-treatment soreness.  He felt much better after manual therapy.  All questions answered.  This patient requires skilled physical therapy services to address the outlined impairments in order to return to his desired level of function.           Impairments: decreased strength, hypertonicity, impaired ADLs, muscular restrictions, impaired flexibility, impaired sensation, joint restriction, pain, postural weakness and trigger points                      Therapy Goals      Goals:      Short Term Goals:  In 6 weeks:  Goal #1: Patient will be independent with progressively monitored home exercise program.   Baseline/Current: Patient is unfamiliar with home exercise program.     Goal #2: Patient will demonstrate cervical ROM within normal limits with minimal symptoms in order to allow for functional activities such as safe driving and looking before crossing the street.   Baseline/Current: Patient demonstrates painful and limited cervical ROM (see ROM testing).     Goal #3: Patient will demonstrate right shoulder range of motion within normal limits with minimal symptoms for proper glenohumeral joint mechanics in order to allow for daily functional tasks.   Baseline/Current: Patient has painful and limited right shoulder range of motion (see ROM testing).     Goal #4: Patient will demonstrate improved posture without cuing.  Baseline/Current: Patient has slumped posture and rounded shoulders.    Long Term Goals:  In 12 weeks:  Goal #1: Patient will demonstrate right shoulder strength within normal limits with minimal symptoms in order to allow for pain free lifting and reaching with right upper extremity.   Baseline/Current: Patient has painful and limited right shoulder strength (see manual muscle testing).     Goal #2: Patient will be able to reach over head with minimal symptoms in order to place/remove an object over shoulder height in a cabinet.  Baseline/Current: Patient has difficulty reaching over head at present.    Goal #3: Patient will return to recreational activities (working out in gym) without limit.  Baseline/Current: Patient unable to do any upper body exercises at present.    Goal #4: Patient's FOTO score will improve from 34 at initial evaluation to >/=  52 at discharge in order to demonstrate a significant improvement in self-reported outcome measures that directly result from his course of physical therapy.   Baseline/Current: FOTO = 34    Plan    Therapy options: will be seen for skilled physical therapy services    Planned therapy interventions: Body Mechanics Training, Dry Needling, Education - Patient, Home Exercise Program, Manual Therapy, Neuromuscular Re-education, Postural Training, Therapeutic Activities and Therapeutic Exercises      Frequency: 1x week    Duration in weeks: 12    Education provided to: patient.    Education provided: HEP, Body mechanics, Importance of Therapy, Treatment options and plan, Symptom management and Body awareness    Education results: verbalized good understanding, demonstrates understanding and needs further instruction.    Communication/Consultation: N/A.    Next visit plan:        Manual therapy to promote improved SterC, AcroC, GHJ motion and loosening muscles, stretching, AAROM, postural/shoulder strengthening    Total Session Time: 45    Treatment rendered today:      Therapeutic Exercise: 15  minutes  - Patient Education: Plan of Care, Home Exercise Program  - Right Upper Trapezius Stretch: 3 x 30  - Right Levator Scapulae Stretch: 3 x 30    Manual Therapy: 30 minutes  - Right Upper Trapezius Pin and Stretch  - Right levator Scapulae Pin and Stretch  - Acupressure right upper trapezius and right levator scapulae  - Right 1st Rib Mobilizations with MET and Pin/Stretch  - Right GHJ AP in PROM ER  - PROM Horizontal Abduction @ 90/90 with GHJ PA  - PROM Flexion with GHJ PA in Max Flexion  Intramuscular Therapy: 5 minutes  [facility] ??was informed of the risks associated with dry needling and he signed consent form.?? Patient screened for any contraindications to needling including pregnancy, infection, antibiotic use, anticoagulants use, autoimmune issues, uncontrolled diabetes, and active cancer/treatment and does not have any of these conditions.  DRY NEEDLING FOR RELEASING IDENTIFIED TRIGGER POINTS TO FACILITATE MANUAL THERAPY TECHNIQUES AND ALLOW MUSCLE TO RETURN TO OPTIMAL LENGTH (NO CHARGE).  Right upper trapezius trigger point with 50mm x .30mm with Pinch/Pull Technique established for safety - Local Twitch Response produced??  Right levator scapulae trigger point with 50mm x .30mm with Pinch/Pull Technique established for safety - Local Twitch Response produced??    Plan details: Physiotec ID: [id number]       Subjective:   History of Present Condition  Date of Surgery: 11/16/2021  Surgical Procedure: Other (Right anterior scalenectomy, middle scalenectomy, 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 right hand dominant adult who presents to outpatient physical therapy with right neck and shoulder pain that began in early 2018 when he was changing the oil in his car when it fell onto his sternum.  He went to the ER and got an x-ray which showed a sternal fracture.  Since then, he has had persistent issues and nerve pressure in his right neck/shoulder which led him to get a right anterior scalenectomy, middle scalenectomy, first rib resection and right Pectoralis minor release surgery performed on 11/16/2021 by Thora Lance, MD.  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    Date of Onset:  09/18/2016  Date is an approximation?: yes  Explanation:  Patient reports onset of symptoms in early 2018.  Subjective:     Vincent Waters reports that he has had a flare-up after he tried to do some push-ups.  That ball that he had came back.  He has been doing his exercises consistently.    Pain  Current pain rating: 2  Location: right anterior/superior shoulder  Quality: tight and discomfort  Progression: improved          Diagnostic Tests    Diagnostic Test Comments:                   Objective:     Static Posture Assessment   Shoulders-Rounded.    Palpation     Right Shoulder   Hypertonic in the pectoralis major, pectoralis minor and upper trapezius.   Muscle spasm in the levator scapulae and upper trapezius.   Tenderness of the upper trapezius.     Right Cervical/Thoracic   Hypertonic in the pectoralis major, pectoralis minor and upper trapezius.   Muscle spasm in the levator scapulae and upper trapezius.   Tenderness of the upper trapezius.     Joint Mobility  Shoulder  Right Shoulder Joint Mobility    Posterior capsule: hypomobile    Strength  Right Shoulder   Planes of Motion Flexion: 4-   Extension: 4-   Abduction: 4-   External rotation at 0??: 4+   Internal rotation at 0??: 4+     General Comments   Shoulder Comments  Right shoulder sitting up and forward.                                  I attest that I have reviewed the above information.  Signed: Patton Salles, PT  09/23/2022 8:02 AM

## 2022-09-23 NOTE — Unmapped (Addendum)
Called  and left voicemail: Left voicemail. Patient called back  Appt date: 09/26/22  Appt time: 08:15 AM. advised to arrive 30 mins prior to appt.  Physician: Dr. Oda Kilts  Location: [id number]   Clinic phone number: [phone]  Driver? No  Advised to call clinic if experiencing COVID symptoms, signs of infection or recent use of antibiotics or steroids.

## 2022-09-26 ENCOUNTER — Ambulatory Visit: Admit: 2022-09-26 | Discharge: 2022-09-26 | Payer: PRIVATE HEALTH INSURANCE

## 2022-09-26 ENCOUNTER — Ambulatory Visit
Admit: 2022-09-26 | Discharge: 2022-09-26 | Payer: PRIVATE HEALTH INSURANCE | Attending: Student in an Organized Health Care Education/Training Program | Primary: Student in an Organized Health Care Education/Training Program

## 2022-09-26 ENCOUNTER — Ambulatory Visit
Admit: 2022-09-26 | Discharge: 2022-09-26 | Payer: PRIVATE HEALTH INSURANCE | Attending: Anesthesiology | Primary: Anesthesiology

## 2022-09-26 DIAGNOSIS — M7918 Myalgia, other site: Principal | ICD-10-CM

## 2022-09-26 MED ADMIN — bupivacaine HCl (MARCAINE) 0.25 % (2.5 mg/mL) injection 37.5 mg: 15 mL | @ 12:00:00 | Stop: 2022-09-26

## 2022-09-26 NOTE — Unmapped (Signed)
AVS reviewed with patient and he verbalized understanding to all.

## 2022-09-26 NOTE — Unmapped (Signed)
POST PROCEDURE INSTRUCTIONS   You may apply an ice pack 20-30 minutes at a time to the injection site if you experience soreness.     Keep the injection site clean and dry. You make remove the band-aid one day following the procedure.     You may take a shower but AVOID getting in to baths, pools or whirlpools for 48 HOURS AFTER THE PROCEDURE.       ACTIVITY   Refrain from heavy activity for the next 24 to 48 hours. General walking is okay. You may resume your normal activities the day following the procedure.   You may start or resume your individualized exercise program or physical therapy 48 hours after the procedure.  IF YOU'VE HAD TRIGGER POINT INJECTIONS, YOU MAY CONTINUE EXERCISE OR PHYSICAL THERAPY WITHOUT DELAY.  MEDICATIONS   Please note that it is okay to continue other prescribed medications (blood pressure, insulin, water pill, depression/anxiety pill, etc.) as well as other prescribed pain medications such as Neurontin, Lyrical, Celebrex, Ultram, Vicodin, Norco and acetaminophen (Tylenol).   SIDE EFFECTS   Increase in pain during the first 24 to 48 hours.     You might experience:   1. Mild to moderate swelling at the joint.   2. Possible bruising at the injection site.     WHEN TO CALL THE DOCTOR/NURSE   Severe pain, worse or different that the pain you had before the procedure.     Fever or chills.     Redness, or swelling around the injection site.     Call the Pain Management  Procedural nurses [phone],  during normal business hours (7 am-3 pm). If it is AFTER HOURS or during a weekend or holiday, call the hospital operator and ask for the Anesthesia Pain physician on call at [phone].   FOR EMERGENCIES, CALL 911 OR GO TO THE NEAREST HOSPITAL EMERGENCY DEPARTMENT.   ?   TO SCHEDULE APPOINTMENTS OR FOR QUESTIONS RELATED TO MEDICATIONS   Call the Pain Management Clinic at [phone]

## 2022-09-29 NOTE — Unmapped (Signed)
[facility]AP[facility] SERVICES AT [facility] [place]  OUTPATIENT PHYSICAL THERAPY  09/29/2022  Note Type: Treatment Note       Patient Name: Vincent Waters  Date of Birth:04-12-1981  Diagnosis:   Encounter Diagnoses   Name Primary?    Neck pain on right side Yes    Chronic right shoulder pain     Shoulder stiffness, right     Thoracic outlet syndrome        Referring MD:  Dawna Part, *     Date of Onset of Impairment-09/18/2016  Date PT Care Plan Established or Reviewed-05/06/2022  Date PT Treatment Started-05/06/2022   Plan of Care Effective Date:          Assessment/Plan:    Assessment  Assessment details:    Jaydee is getting relief from manual therapy to address his right upper trapezius, but it continues to come back.  I expect it to resolve at some point as we consistently release his musculature.  At that point, we'll resume our functional strengthening.  All questions answered.  This patient requires skilled physical therapy services to address the outlined impairments in order to return to his desired level of function.           Impairments: decreased strength, hypertonicity, impaired ADLs, muscular restrictions, impaired flexibility, impaired sensation, joint restriction, pain, postural weakness and trigger points                      Therapy Goals      Goals:      Short Term Goals:  In 6 weeks:  Goal #1: Patient will be independent with progressively monitored home exercise program.   Baseline/Current: Patient is unfamiliar with home exercise program.     Goal #2: Patient will demonstrate cervical ROM within normal limits with minimal symptoms in order to allow for functional activities such as safe driving and looking before crossing the street.   Baseline/Current: Patient demonstrates painful and limited cervical ROM (see ROM testing).     Goal #3: Patient will demonstrate right shoulder range of motion within normal limits with minimal symptoms for proper glenohumeral joint mechanics in order to allow for daily functional tasks.   Baseline/Current: Patient has painful and limited right shoulder range of motion (see ROM testing).     Goal #4: Patient will demonstrate improved posture without cuing.  Baseline/Current: Patient has slumped posture and rounded shoulders.    Long Term Goals:  In 12 weeks:  Goal #1: Patient will demonstrate right shoulder strength within normal limits with minimal symptoms in order to allow for pain free lifting and reaching with right upper extremity.   Baseline/Current: Patient has painful and limited right shoulder strength (see manual muscle testing).     Goal #2: Patient will be able to reach over head with minimal symptoms in order to place/remove an object over shoulder height in a cabinet.  Baseline/Current: Patient has difficulty reaching over head at present.    Goal #3: Patient will return to recreational activities (working out in gym) without limit.  Baseline/Current: Patient unable to do any upper body exercises at present.    Goal #4: Patient's FOTO score will improve from 34 at initial evaluation to >/= 52 at discharge in order to demonstrate a significant improvement in self-reported outcome measures that directly result from his course of physical therapy.   Baseline/Current: FOTO = 34    Plan    Therapy options: will be seen for skilled  physical therapy services    Planned therapy interventions: Body Mechanics Training, Dry Needling, Education - Patient, Home Exercise Program, Manual Therapy, Neuromuscular Re-education, Postural Training, Therapeutic Activities and Therapeutic Exercises      Frequency: 1x week    Duration in weeks: 12    Education provided to: patient.    Education provided: HEP, Body mechanics, Importance of Therapy, Treatment options and plan, Symptom management and Body awareness    Education results: verbalized good understanding, demonstrates understanding and needs further instruction.    Communication/Consultation: N/A.    Next visit plan:        Manual therapy to promote improved SterC, AcroC, GHJ motion and loosening muscles, stretching, AAROM, postural/shoulder strengthening    Total Session Time: 35    Treatment rendered today:      Therapeutic Exercise: 5  minutes  - Patient Education: Plan of Care, Home Exercise Program  - Right Upper Trapezius Stretch: 3 x 30  - Right Levator Scapulae Stretch: 3 x 30    Manual Therapy: 30 minutes  - Right Upper Trapezius Pin and Stretch  - Right levator Scapulae Pin and Stretch  - Side Lying Quick Stretch and fish hook  Intramuscular Therapy: 5 minutes  [facility]  was informed of the risks associated with dry needling and he signed consent form.  Patient screened for any contraindications to needling including pregnancy, infection, antibiotic use, anticoagulants use, autoimmune issues, uncontrolled diabetes, and active cancer/treatment and does not have any of these conditions.  DRY NEEDLING FOR RELEASING IDENTIFIED TRIGGER POINTS TO FACILITATE MANUAL THERAPY TECHNIQUES AND ALLOW MUSCLE TO RETURN TO OPTIMAL LENGTH (NO CHARGE).  Right upper trapezius trigger point with 50mm x .30mm with Pinch/Pull Technique established for safety - Local Twitch Response produced   Right levator scapulae trigger point with 50mm x .30mm with Pinch/Pull Technique established for safety - Local Twitch Response produced     Plan details: Physiotec ID: [id number]       Subjective:   History of Present Condition  Date of Surgery: 11/16/2021  Surgical Procedure: Other (Right anterior scalenectomy, middle scalenectomy, 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 right hand dominant adult who presents to outpatient physical therapy with right neck and shoulder pain that began in early 2018 when he was changing the oil in his car when it fell onto his sternum.  He went to the ER and got an x-ray which showed a sternal fracture.  Since then, he has had persistent issues and nerve pressure in his right neck/shoulder which led him to get a right anterior scalenectomy, middle scalenectomy, first rib resection and right Pectoralis minor release surgery performed on 11/16/2021 by Thora Lance, MD.  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    Date of Onset:  09/18/2016  Date is an approximation?: yes  Explanation:  Patient reports onset of symptoms in early 2018.     Subjective:     Vincent Waters reports that he felt better after last appointment.  He had trigger point injections about 3 days ago and the ball in his neck started coming back.  He has been doing his exercises consistently.    Pain  Current pain rating: 5  Location: right anterior/superior shoulder  Quality: tight and discomfort  Progression: improved          Diagnostic  Tests    Diagnostic Test Comments:                     Objective:     Static Posture Assessment   Shoulders-Rounded.    Palpation     Right Shoulder   Hypertonic in the pectoralis major, pectoralis minor and upper trapezius.   Muscle spasm in the levator scapulae and upper trapezius.   Tenderness of the levator scapulae and upper trapezius.   Trigger point to levator scapulae.     Right Cervical/Thoracic   Hypertonic in the pectoralis major, pectoralis minor and upper trapezius.   Muscle spasm in the levator scapulae and upper trapezius.   Tenderness of the levator scapulae and upper trapezius.   Trigger point to levator scapulae.     Joint Mobility  Shoulder  Right Shoulder Joint Mobility    Posterior capsule: hypomobile    General Comments   Shoulder Comments  Right shoulder sitting up and forward.                                  I attest that I have reviewed the above information.  Signed: Patton Salles, PT  09/29/2022 3:15 PM

## 2022-10-07 ENCOUNTER — Ambulatory Visit
Admit: 2022-10-07 | Payer: PRIVATE HEALTH INSURANCE | Attending: Rehabilitative and Restorative Service Providers" | Primary: Rehabilitative and Restorative Service Providers"

## 2022-10-07 ENCOUNTER — Ambulatory Visit
Admit: 2022-10-07 | Discharge: 2022-11-01 | Payer: PRIVATE HEALTH INSURANCE | Attending: Rehabilitative and Restorative Service Providers" | Primary: Rehabilitative and Restorative Service Providers"

## 2022-10-07 NOTE — Unmapped (Signed)
[facility] AT [facility] [place]  OUTPATIENT PHYSICAL THERAPY  10/07/2022  Note Type: Treatment Note       Patient Name: Vincent Waters  Date of Birth:31-Jul-1980  Diagnosis:   Encounter Diagnoses   Name Primary?    Neck pain on right side Yes    Chronic right shoulder pain     Shoulder stiffness, right     Thoracic outlet syndrome        Referring MD:  Dawna Part, *     Date of Onset of Impairment-09/18/2016  Date PT Care Plan Established or Reviewed-05/06/2022  Date PT Treatment Started-05/06/2022   Plan of Care Effective Date:          Assessment/Plan:    Assessment  Assessment details:    Jasmin's upper trapezius spasm is slowly relaxing, but he continues to have a base level of guarding (likely anticipatory for pain/protective mechanism) which is likely contributing to his pain and spasm.  Discussed this and he mentioned he will do his best to check in with his body throughout the day to try and promote increased relaxation.  All questions answered.  This patient requires skilled physical therapy services to address the outlined impairments in order to return to his desired level of function.           Impairments: decreased strength, hypertonicity, impaired ADLs, muscular restrictions, impaired flexibility, impaired sensation, joint restriction, pain, postural weakness and trigger points                      Therapy Goals      Goals:      Short Term Goals:  In 6 weeks:  Goal #1: Patient will be independent with progressively monitored home exercise program.   Baseline/Current: Patient is unfamiliar with home exercise program.     Goal #2: Patient will demonstrate cervical ROM within normal limits with minimal symptoms in order to allow for functional activities such as safe driving and looking before crossing the street.   Baseline/Current: Patient demonstrates painful and limited cervical ROM (see ROM testing).     Goal #3: Patient will demonstrate right shoulder range of motion within normal limits with minimal symptoms for proper glenohumeral joint mechanics in order to allow for daily functional tasks.   Baseline/Current: Patient has painful and limited right shoulder range of motion (see ROM testing).     Goal #4: Patient will demonstrate improved posture without cuing.  Baseline/Current: Patient has slumped posture and rounded shoulders.    Long Term Goals:  In 12 weeks:  Goal #1: Patient will demonstrate right shoulder strength within normal limits with minimal symptoms in order to allow for pain free lifting and reaching with right upper extremity.   Baseline/Current: Patient has painful and limited right shoulder strength (see manual muscle testing).     Goal #2: Patient will be able to reach over head with minimal symptoms in order to place/remove an object over shoulder height in a cabinet.  Baseline/Current: Patient has difficulty reaching over head at present.    Goal #3: Patient will return to recreational activities (working out in gym) without limit.  Baseline/Current: Patient unable to do any upper body exercises at present.    Goal #4: Patient's FOTO score will improve from 34 at initial evaluation to >/= 52 at discharge in order to demonstrate a significant improvement in self-reported outcome measures that directly result from his course of physical therapy.   Baseline/Current: FOTO = 34  Plan    Therapy options: will be seen for skilled physical therapy services    Planned therapy interventions: Body Mechanics Training, Dry Needling, Education - Patient, Home Exercise Program, Manual Therapy, Neuromuscular Re-education, Postural Training, Therapeutic Activities and Therapeutic Exercises      Frequency: 1x week    Duration in weeks: 12    Education provided to: patient.    Education provided: HEP, Body mechanics, Importance of Therapy, Treatment options and plan, Symptom management and Body awareness    Education results: verbalized good understanding, demonstrates understanding and needs further instruction.    Communication/Consultation: N/A.    Next visit plan:        Manual therapy to promote improved SterC, AcroC, GHJ motion and loosening muscles, stretching, AAROM, postural/shoulder strengthening    Total Session Time: 35    Treatment rendered today:      Therapeutic Exercise: 5  minutes  - Patient Education: Plan of Care, Home Exercise Program  - Right Upper Trapezius Stretch: 3 x 30  - Right Levator Scapulae Stretch: 3 x 30    Manual Therapy: 30 minutes  - Right Upper Trapezius Pin and Stretch  - Right levator Scapulae Pin and Stretch  - Supine 1st rib mobilization with Scalene Pin and Stretch  - Supine AP GHJ  - Supine AP in PROM IR  - Side Lying Quick Stretch and fish hook  Intramuscular Therapy: 5 minutes  [facility]  was informed of the risks associated with dry needling and he signed consent form.  Patient screened for any contraindications to needling including pregnancy, infection, antibiotic use, anticoagulants use, autoimmune issues, uncontrolled diabetes, and active cancer/treatment and does not have any of these conditions.  DRY NEEDLING FOR RELEASING IDENTIFIED TRIGGER POINTS TO FACILITATE MANUAL THERAPY TECHNIQUES AND ALLOW MUSCLE TO RETURN TO OPTIMAL LENGTH (NO CHARGE).  Right upper trapezius trigger point with 50mm x .30mm with Pinch/Pull Technique established for safety - Local Twitch Response produced   Right levator scapulae trigger point with 50mm x .30mm with Pinch/Pull Technique established for safety - Local Twitch Response produced     Plan details: Physiotec ID: [id number]       Subjective:   History of Present Condition  Date of Surgery: 11/16/2021  Surgical Procedure: Other (Right anterior scalenectomy, middle scalenectomy, first rib resection and right Pectoralis minor release)  History of Present Condition/Chief Complaint:       [name] is a 41 y.o. right hand dominant adult who presents to outpatient physical therapy with right neck and shoulder pain that began in early 2018 when he was changing the oil in his car when it fell onto his sternum.  He went to the ER and got an x-ray which showed a sternal fracture.  Since then, he has had persistent issues and nerve pressure in his right neck/shoulder which led him to get a right anterior scalenectomy, middle scalenectomy, first rib resection and right Pectoralis minor release surgery performed on 11/16/2021 by Thora Lance, MD.  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    Date of Onset:  09/18/2016  Date is an approximation?: yes  Explanation:  Patient reports onset of symptoms in early 2018.     Subjective:     Mr. Uyehara reports that he felt better for a couple days after last session, but it tightened back up.  He has been doing his exercises consistently.    Pain  Current pain rating:  5  Location: right anterior/superior shoulder  Quality: tight and discomfort  Progression: improved          Diagnostic Tests    Diagnostic Test Comments:                     Objective:     Static Posture Assessment   Shoulders-Rounded.    Palpation     Right Shoulder   Hypertonic in the pectoralis major, pectoralis minor and upper trapezius.   Muscle spasm in the levator scapulae and upper trapezius.   Tenderness of the levator scapulae and upper trapezius.   Trigger point to levator scapulae.     Right Cervical/Thoracic   Hypertonic in the pectoralis major, pectoralis minor and upper trapezius.   Muscle spasm in the levator scapulae and upper trapezius.   Tenderness of the levator scapulae and upper trapezius.   Trigger point to levator scapulae.     Joint Mobility  Shoulder  Right Shoulder Joint Mobility    Posterior capsule: hypomobile    General Comments   Shoulder Comments  Right shoulder sitting up and forward.                                  I attest that I have reviewed the above information.  Signed: Patton Salles, PT  10/07/2022 8:06 AM

## 2022-10-11 DIAGNOSIS — T7800XS Anaphylactic reaction due to unspecified food, sequela: Principal | ICD-10-CM

## 2022-10-11 MED ORDER — BD REGULAR BEVEL NEEDLES 25 GAUGE X 5/8"
0 refills | 0 days
Start: 2022-10-11 — End: ?

## 2022-10-11 MED ORDER — SAFETY NEEDLES 18 GAUGE X 1 1/2"
0 refills | 0 days
Start: 2022-10-11 — End: ?

## 2022-10-11 MED ORDER — EPINEPHRINE 0.3 MG/0.3 ML INJECTION, AUTO-INJECTOR
Freq: Once | SUBCUTANEOUS | 0 refills | 2 days
Start: 2022-10-11 — End: 2022-10-11

## 2022-10-12 MED FILL — GABAPENTIN 250 MG/5 ML ORAL SOLUTION: ORAL | 30 days supply | Qty: 1080 | Fill #1

## 2022-10-12 MED FILL — BD SAFETYGLIDE NEEDLE 18 GAUGE X 1 1/2": 84 days supply | Qty: 12 | Fill #0

## 2022-10-12 MED FILL — BD REGULAR BEVEL NEEDLES 25 GAUGE X 5/8": 84 days supply | Qty: 12 | Fill #0

## 2022-10-12 MED FILL — SYMBICORT 160 MCG-4.5 MCG/ACTUATION HFA AEROSOL INHALER: RESPIRATORY_TRACT | 30 days supply | Qty: 10.2 | Fill #2

## 2022-10-13 ENCOUNTER — Ambulatory Visit: Admit: 2022-10-13 | Discharge: 2022-10-14 | Payer: PRIVATE HEALTH INSURANCE

## 2022-10-13 LAB — TOXICOLOGY SCREEN, URINE
AMPHETAMINE SCREEN URINE: NEGATIVE
BARBITURATE SCREEN URINE: NEGATIVE
BENZODIAZEPINE SCREEN, URINE: NEGATIVE
BUPRENORPHINE, URINE SCREEN: NEGATIVE
CANNABINOID SCREEN URINE: NEGATIVE
COCAINE(METAB.)SCREEN, URINE: NEGATIVE
FENTANYL SCREEN, URINE: NEGATIVE
METHADONE SCREEN, URINE: NEGATIVE
OPIATE SCREEN URINE: NEGATIVE
OXYCODONE SCREEN URINE: NEGATIVE

## 2022-10-13 LAB — SYPHILIS SCREEN: SYPHILIS RPR SCREEN: NONREACTIVE

## 2022-10-13 MED ORDER — EPINEPHRINE 0.3 MG/0.3 ML INJECTION, AUTO-INJECTOR
Freq: Once | SUBCUTANEOUS | 0 refills | 2 days | Status: CP
Start: 2022-10-13 — End: 2022-10-15
  Filled 2022-10-26: qty 2, 2d supply, fill #0

## 2022-10-13 NOTE — Unmapped (Signed)
[facility] Refill Coordination Note    Vincent Waters, [place]: 01-Sep-1980  Phone: [phone] (home) [phone] (work)      All above HIPAA information was verified with patient.         10/11/2022     9:31 PM   Specialty Rx Medication Refill Questionnaire   Which Medications would you like refilled and shipped? Xolair   Please list all current allergies: Penecillin   Have you missed any doses in the last 30 days? No   Have you had any changes to your medication(s) since your last refill? No   How many days remaining of each medication do you have at home? Nonw   If receiving an injectable medication, next injection date is 10/20/2022   Have you experienced any side effects in the last 30 days? No   Please enter the full address (street address, city, state, zip code) where you would like your medication(s) to be delivered to. [address] [place] , [address]   Please specify on which day you would like your medication(s) to arrive. Note: if you need your medication(s) within 3 days, please call the pharmacy to schedule your order at [phone]  10/13/2022   Has your insurance changed since your last refill? No   Would you like a pharmacist to call you to discuss your medication(s)? No   Do you require a signature for your package? (Note: if we are billing Medicare Part B or your order contains a controlled substance, we will require a signature) No         Completed refill call assessment today to schedule patient's medication shipment from the [facility] [id number]).  All relevant notes have been reviewed.       Confirmed patient received a Conservation officer, historic buildings and a Surveyor, mining with first shipment. The patient will receive a drug information handout for each medication shipped and additional FDA Medication Guides as required.         REFERRAL TO PHARMACIST     Referral to the pharmacist: Not needed      [facility]     Shipping address confirmed in Epic. Delivery Scheduled: Yes, Expected medication delivery date: 10/14/22.     Medication will be delivered via Same Day Courier to the prescription address in Epic WAM.    Elianne Gubser' [name] Shared [facility] Specialty Technician

## 2022-10-13 NOTE — Unmapped (Signed)
[facility]  Established Patient Clinic Note    Assessment/Plan:   Vincent Waters is a 41 y.o.adult who presents for follow-up.    Problem List Items Addressed This Visit       Attention deficit hyperactivity disorder (ADHD), combined type - Primary    Relevant Orders    Toxicology Screen, Urine (Completed)    Amphetamine, Urine Confirmation    Opiate Confirmation, Urine    Chronic pain syndrome    Relevant Orders    Toxicology Screen, Urine (Completed)    Amphetamine, Urine Confirmation    Opiate Confirmation, Urine    Anti-Nuclear Antibody (ANA)    On pre-exposure prophylaxis for HIV    Relevant Orders    HIV Antigen/Antibody Combo    Syphilis Screen    Anti-Nuclear Antibody (ANA)     Other Visit Diagnoses       Diarrhea, unspecified type        Relevant Orders    GI Pathogen Panel    Pancreatic Elastase, Fecal    Obstructive sleep apnea              Attending: Dr. 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 Pt states fecal elastase was negative in clinic but this is not documented so will repeat today.   - GI f/up   - Fecal elastase + bacterial stool panel    # OSA  Pt has been using CPAP every night. Is having some discomfort with full facial mask, mostly related to his night-time nausea and occasional emesis which then gets trapped in the mask. Has noticed some improvement in sleep and daytime sleepiness, though still feels his sleep has not been optimized. S/p operative repair of deviated septum but still having significant nasal congestion and rhinorrhea with postnasal drip, has f/up with ENT surgery next month.   - ENT surgery next month  - Continue CPAP use    # Pre-exposure prophylaxis (PrEP) against HIV  Patient is sexually active transgender male with cis male partners. They have had condom-less sexual encounters since last visit. No known exposure to HIV. They do not use IV drugs. Condom use: inconsistent. Denies fever, fatigue, skin rash, pharyngitis, cervical lymphadenopathy, or any other symptoms concerning for acute HIV. Based on this information, they are at high risk for contracting HIV and are a good candidate for pre-exposure prophylaxis against HIV. This is a preventive medicine service and E/M modifier 33 should apply to all testing and prescriptions. The lab tests listed below are required to ensure the safety of continuing pre-exposure prophylaxis (PrEP) against HIV.  - Labs drawn today: HIV, Cr, RPR, GC/CT (urine, throat, anal)  - Repeat monitoring labs in 3 months  - Provided adherence and risk reduction / safer sex counseling  - PrEP refill provided today    # ADHD  - PDMP shows appropriate refills  - Utox + amphetamine conf + opioid conf pending     HEALTH MAINTENANCE ITEMS STILL DUE:  Health Maintenance Due   Topic Date Due  Pneumococcal Vaccine 0-64 (2 of 2 - PCV) 06/24/2021     Follow-up: No follow-ups on file.    Future Appointments   Date Time Provider Department Center   10/17/2022  9:00 AM Galen Daft, FNP ANESPAINMRKT [facility]   10/20/2022  8:00 AM Roetta Sessions, MD ORTHFARR [facility]   10/21/2022  8:00 AM Seymour Bars, PT PTOT [facility]   10/24/2022  8:30 AM [name], MD UNCALLERGET [facility]   10/25/2022  8:00 AM MEDDDN MOTILITY NURSE UNCGIMEDET [facility]   11/04/2022  8:00 AM Lobonc, Ammie Ferrier, MD ANESPAINMRKT [facility]   11/09/2022  2:40 PM Mahoro, Champ Mungo, MD UNCGIMEDET [facility]   11/15/2022  9:00 AM Mena Goes, PsyD [facility]   11/17/2022  9:00 AM Thora Lance, MD HVSURG [facility]   11/17/2022  3:20 PM Kern Reap, MD [facility]   01/12/2023  9:45 AM Ralene Ok, Adam [place], MD [facility]   07/25/2023  8:30 AM Almodovar Sunday Shams, MD [facility]       Subjective   Vincent Waters is a 42 y.o. adult  coming to clinic today for the following issues:    Chief Complaint   Patient presents with    Follow-up     Follow up medication      HPI: see above for more information.     I have reviewed the problem list, medications, and allergies and have updated/reconciled them if needed.    Vincent Waters  reports that he has quit smoking. His smoking use included cigarettes. He has never used smokeless tobacco.  Health Maintenance   Topic Date Due    Pneumococcal Vaccine 0-64 (2 of 2 - PCV) 06/24/2021    Lipid Screening  01/22/2025    DTaP/Tdap/Td Vaccines (2 - Td or Tdap) 08/19/2027    Hepatitis C Screen  Completed    COVID-19 Vaccine  Completed    Influenza Vaccine  Completed       Objective     VITALS: BP 127/79 (BP Site: L Arm, BP Position: Sitting, BP Cuff Size: Medium)  - Pulse 84  - Temp 36.1 ??C (97 ??F) (Temporal)  - Ht 162.6 cm (5' 4.02)  - Wt 73.9 kg (163 lb)  - LMP 07/20/2017 (Exact Date)  - BMI 27.96 kg/m??     Physical Exam  General: well-appearing, sitting upright in no acute distress  Head: Normocephalic, atraumatic  ENT: No dental trauma noted.   Eyes: conjunctiva normal, non-erythematous, non-icteric, no discharge.  Neck: no thyroid enlargement or masses  Lungs: No increased work of breathing or audible wheezing  Skin: Warm, dry, no erythema or rash on exposed skin  Musculoskeletal: No visible gait abnormalities  Neurologic: Alert & oriented x 3, no gross sensorimotor abnormalities  Psychiatric: Pleasant, cooperative, good eye contact, appropriate thought processes    Wt Readings from Last 3 Encounters:   10/13/22 73.9 kg (163 lb)   08/05/22 74 kg (163 lb 3.2 oz)   06/24/22 75.3 kg (166 lb)      PHQ-9 PHQ-9 TOTAL SCORE   10/22/2020   9:00 AM 6   09/04/2020   9:00 AM 3   08/21/2020 9:00 AM 5   05/22/2020   3:00 PM 9     LABS/IMAGING  I have reviewed pertinent recent labs and imaging in Epic    Alver Fisher, MD, MPH (he/him)  Resident Physician, PGY-3  Department of Family Medicine  [facility]  [place] [facility] [place] [facility]  [address] [place], [place] [address]  Telephone [phone]  Fax [phone]  [email or web address]

## 2022-10-14 MED FILL — XOLAIR 150 MG/ML SUBCUTANEOUS SYRINGE: SUBCUTANEOUS | 28 days supply | Qty: 2 | Fill #0

## 2022-10-14 NOTE — Unmapped (Signed)
Hi Vincent Waters,    It was great to see you today. I look forward to seeing you at your next visit. In the meantime, please feel free to message me on mychart with any non-urgent questions or concerns (for example, refill requests for a long-term medication, or inquiries regarding vaccine availability). I do my best to respond to messages within 3-5 business days, however occasionally it may take longer depending on my other clinical duties.     For time-sensitive and/or new clinical concerns (including evaluation of new symptoms, medication adjustments, time-sensitive referral requests, or completion of disability or pre-employment paperwork), please call [phone] to request a phone, video, or in-person visit with me or one of my colleagues, so that we may address your concern as safely and expediently as possible.     In the event of a medical emergency, please call 911 or present immediately to your nearest emergency room. If you ever need urgent help with your mental health, including for any thoughts of self-harm or suicide, please call the [place] mental health crisis line at [phone], or the national suicide prevention hotline at 847-464-6211.     Take care,    Claudie Revering, MD, MPH (he/him/his)  PGY-3, [facility]   [address], [place], [place] [address]   Ph [phone] (Phone) - Fax [phone]

## 2022-10-16 LAB — HIV ANTIGEN/ANTIBODY COMBO: HIV ANTIGEN/ANTIBODY COMBO: NONREACTIVE

## 2022-10-17 ENCOUNTER — Ambulatory Visit: Admit: 2022-10-17 | Discharge: 2022-10-18 | Payer: PRIVATE HEALTH INSURANCE

## 2022-10-17 DIAGNOSIS — M7918 Myalgia, other site: Principal | ICD-10-CM

## 2022-10-17 DIAGNOSIS — G894 Chronic pain syndrome: Principal | ICD-10-CM

## 2022-10-17 DIAGNOSIS — G540 Brachial plexus disorders: Principal | ICD-10-CM

## 2022-10-17 MED ORDER — GABAPENTIN 250 MG/5 ML ORAL SOLUTION
Freq: Three times a day (TID) | ORAL | 1 refills | 30 days | Status: CP
Start: 2022-10-17 — End: ?
  Filled 2022-11-23: qty 1080, 30d supply, fill #0

## 2022-10-17 NOTE — Unmapped (Signed)
Addended by: Carolynne Edouard on: 10/17/2022 12:07 PM     Modules accepted: Orders

## 2022-10-17 NOTE — Unmapped (Addendum)
It was good to see you today.    Continue your medications with no changes.    We will plan for the right spinal accessory nerve block on 5/17.    We will see you in clinic in 3 months, or sooner if needed.

## 2022-10-17 NOTE — Unmapped (Signed)
I was available during Dr. Marzetta Board clinic session.  I agree with the assessment and plan as documented in his note. Ulla Gallo, MD

## 2022-10-17 NOTE — Unmapped (Signed)
Department of Anesthesiology  [facility]  [address], [address]  [place], [place] [address]  [phone]    Chronic Pain Follow-Up Note  1. Myofascial pain    2. Thoracic outlet syndrome    3. TOS (thoracic outlet syndrome)    4. Bursitis of left hip, unspecified bursa    5. Chronic pain syndrome        Assessment and Plan  Vincent Waters is a 42 y.o. 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 Gayla Doss in May 2023.  He was first seen by our clinic in March 2023 for diagnostic injections.  His range of motion has an increased postoperatively, but still has tight, spasming pain in the right neck and shoulder.  He is currently weaning off benzodiazepines, discontinued opioid medications in late 2023 which he was chronically on for 3 years, and inquires about optimizing medications and procedures to improve his baseline pain at this time.  His primary pain generator includes myofascial pain post-TOS decompression.  He has other medical history that includes anxiety, scleroderma, migraines, autoimmune autonomic neuropathy, asthma.  Also follows with sports medicine and has undergone injections there as well.      April 2024  At last visit in February, the patient presented for a video visit due to possible viral illness.  Overall was doing OK.  Had a minor setback with PT as they were focusing on strength, but having a little more spasms.  So now they were working on both stretching/ROM and strength, which we discussed finding that balance was important.  He found significant benefit from the trigger point injections, which then helped him participate in PT and HEP.  Continued to have hip issues, which he noted had been going on for years.   He had GTB injections with minimal relief.  They were adding in some hip stuff at PT, and we discussed HEP and I would provide specific stretches in his AVS.  We discussed at next TPI visit, we could do a good exam and if things were still persisting, could order an MRI to evaluate for tears or tendonosis that had been resistant to treatment.        Today, the patient returns reporting improvement in pain following serial TPIs in the past several months, along with continued PT. He notes that his pain is now associated with new nerve symptoms that extend from his right shoulder into his neck and behind his right eye. He associates these symptoms with migraines, though notes that typically his migraines start in the base of his skull or the crown of his head and radiate forward. He is interested in a nerve block as discussed with Dr. Oda Kilts, so we will plan for right spinal accessory nerve block with ultrasound at his next procedure appointment on 5/17, which will replace his TPIs. He otherwise has had great results from his treatment thus far and is hopeful that this is a minor setback in his pain and he will be able to continue working with PT to improve symptoms for the long-term. He continues to find gabapentin helpful without significant adverse effects.    Thoracic Outlet Syndrome - Myofascial pain - Chronic Pain Syndrome   - Continue following with PT  - Schedule right spinal accessory nerve block under ultrasound  - Repeat TPIs PRN, not at the same time as the block  -  Continue gabapentin 600 mg oral solution TID, refilled x2, one refill remaining  - Continue clonazepam taper with PCP    We are delivering comprehensive, continuous, longitudinal care for this patient with chronic pain.     Considerations for the future:  - Repeat TPIs PRN  - Oromaxillofacial pain referral if trigeminal neuralgia suspected/ deemed separate from TOS pain   - Consider initiation of Butrans/buprenorphine product after weaning off of benzodiazepines and pain psych evaluation for COM should conservative measures fail, but hoping to avoid if possible  - Consider up titration of gabapentin oral solution or rotation to Lyrica   - Hip MRI without contrast    Urine toxicology: 04/28/22  Urine toxicology screen was appropriate.   Last Opioid Change: Last Rx in 04/28/22  Last EKG: N/A  Previous Compliance Issues: None  Naloxone ordered: no  Total morphine equivalents: 0  Benzodiazepine: Yes. Weaning.  [place]CC[place] database was reviewed 10/17/22     Return in about 3 months (around 01/16/2023).    Orders Placed This Encounter   Procedures    Nerve Inj Other Periph Nerve [id number])     Right spinal accessory nerve block with steroid - Lobonc. Replace TPIs on 5/17.     Standing Status:   Future     Standing Expiration Date:   10/17/2023    Ultrasonic guidance for needle placement (29562)     With 64450-spinal accessory nerve block     Standing Status:   Future     Standing Expiration Date:   10/17/2023     Requested Prescriptions     Signed Prescriptions Disp Refills    gabapentin (NEURONTIN) 250 mg/5 mL oral solution 1080 mL 1     Sig: Take 12 mL (600 mg total) by mouth Three (3) times a day.     HPI:  Vincent Waters is seen in consultation at the request of Dr. Gayla Doss for evaluation and recommendations regarding His pain.     Patient was last seen in February. Since then, the patient has followed with PT. They underwent TPIs with our clinic on 08/23/22 and 09/26/22. We ordered MRI for further evaluation of persisting hip pain.      Today, the patient returns reporting improved pain generally following his serial TPIs and continued gabapentin use. He has found his therapy/treatment very helpful for the pain symptoms he has been dealing with following TOS surgery. He does note new pain associated with a muscle ball that occurs about a week after TPIs and sometimes following PT sessions. This area causes neuropathic pain to radiate from his right neck/clavicular area to his right face and orbital area. He notes the pain near his eye seems similar to his migraines, though it starts in a different place and is always associated with the muscle ball. He is interested in trying the nerve block as discussed with Dr. Oda Kilts at his last injection appointment. He does find benefit with the use of gabapentin liquid without significant adverse effects. The patient did not tolerate muscle relaxants previously. He is pleased with his progress and does find the procedures, PT, and medications helpful for his TOS symptoms.    Current Medications:  Gabapentin 600 mg TID- has been on for years.  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  Voltaren gel    The patient states his pain is located right neck/shoulder and the severity of his pain ranges from 3/10 to 7/10.  His pain currently is 6/10 and on average is 6/10. He describes  the sensation of his pain as aching, pressing, pulling. His pain is present all of the time and worst mornings, evenings, middle of the night. The patient???s pain impacts general activity, normal work, recreational activities, sleep. His interval history includes None. His pain is better, and he does have new pain to discuss today. He is not on blood thinners or anti-coagulants. In regards to medications currently taken for pain management, the patient is tolerating these medications well and complains of associated side effects: None.    Previous Medication Trials:  NSAIDS- ibuprofen  Antidepressants-   Anticonvulsant- gabapentin  Muscle relaxants- flexeril, baclofen  Topicals-voltaren gel  Short-acting opiates-percocet   Long-acting opiates-   Anxiolytics-   Other-     Previous Interventions  Medications, PT, surgery, injections with sports med  TOS decompression/PMR 11/16/21  TPI-05/10/22-very helpful.  Have repeated several times since  PT-2023  Left GTB CSI-sports med 05/27/22  Spinal accessory nerve block-TBD    Allergies as of 10/17/2022 - Reviewed 10/17/2022   Allergen Reaction Noted    Latex Rash and Hives 09/16/2016    Penicillins Anaphylaxis 09/16/2016 Shellfish containing products Anaphylaxis 01/23/2017    Other Rash 12/16/2020    Latex, natural rubber Rash 08/20/2015    Penicillin Rash 02/14/2018      Current Outpatient Medications   Medication Sig Dispense Refill    albuterol 2.5 mg /3 mL (0.083 %) nebulizer solution Inhale 3 mL (2.5 mg total) by nebulization every six (6) hours as needed for wheezing or shortness of breath. 90 mL 1    azelastine (ASTELIN) 137 mcg (0.1 %) nasal spray Instill 2 sprays into each nostril two (2) times a day as needed for rhinitis. 30 mL 1    budesonide-formoterol (SYMBICORT) 160-4.5 mcg/actuation inhaler Inhale 1 puff by mouth Two (2) times a day. 10.2 g 5    cetirizine (ZYRTEC) 10 MG tablet Take 1 tablet (10 mg total) by mouth daily. 30 tablet 12    clindamycin (CLEOCIN T) 1 % lotion 1 Application as needed.      clonazePAM (KLONOPIN) 0.5 MG tablet Take half tablet (0.25 mg total) by mouth once daily as needed for anxiety. For the next two weeks, every other day, may take an additional half a tablet as needed. After two weeks, decrease to taking half a tablet once daily as needed, without the additional dose 30 tablet 0    dextroamphetamine-amphetamine (ADDERALL) 20 mg tablet Take 1 tablet (20 mg total) by mouth daily as needed. 10 tablet 0    emtricitabine-tenofovir alafen (DESCOVY) 200-25 mg tablet Take 1 tablet by mouth daily. 90 tablet 0    famotidine (PEPCID) 40 MG tablet Take 1 tablet (40 mg total) by mouth every evening. 30 tablet 1    gabapentin (NEURONTIN) 250 mg/5 mL oral solution Take 12 mL (600 mg total) by mouth Three (3) times a day. 1080 mL 2    inhalational spacing device Spcr Use as directed with albuterol and symbicort 1 each 1    nebulizer and compressor (COMP-AIR NEBULIZER COMPRESSOR) Devi 1 each by Miscellaneous route nightly as needed. 1 each 0    needle, disp, 25 Markevion (BD REGULAR BEVEL NEEDLES) 25 Chas x 5/8 Ndle For subcutaneous hormone injection. 25 each 0    nifedipine 0.3% lidocaine 1.5% in petrolatum ointment Apply a pea-size amount to anal area 3 times a day 100 g 0    omalizumab (XOLAIR) 150 mg/mL syringe Inject 2 mL (300 mg total) under the skin every twenty-eight (28)  days. 2 mL 12    pantoprazole (PROTONIX) 20 MG tablet Take 1 tablet (20 mg total) by mouth daily. 30 tablet 1    prucalopride 2 mg Tab Take 1 tablet (2 mg total) by mouth daily. 90 tablet 0    safety needles (BD ECLIPSE) 25 Marquay x 1 Ndle use for testosterone 12 each 0    safety needles (BD SAFETYGLIDE NEEDLE) 18 Jakie x 1 1/2 Ndle For drawing hormone injection 25 each 0    syringe, disposable, (EASY TOUCH LUER LOCK SYRINGE) 1 mL Syrg Use for weekly hormone injection. 4 each 0    syringe, disposable, (EASY TOUCH LUER LOCK SYRINGE) 1 mL Syrg Use for testosterone 12 each 0    syringe, disposable, 2.5 mL Syrg Use weekly 30 Syringe 2    testosterone cypionate (DEPOTESTOTERONE CYPIONATE) 200 mg/mL injection Inject 0.2 mL (40 mg total) under the skin every seven (7) days. 2 mL 0    triamcinolone (KENALOG) 0.1 % cream Apply topically Two (2) times a day. 15 g 0    EPINEPHrine (EPIPEN) 0.3 mg/0.3 mL injection Inject 0.3 mL (0.3 mg total) under the skin once for 1 dose. 2 each 0    gabapentin (NEURONTIN) 250 mg/5 mL oral solution Take 12 mL (600 mg total) by mouth Three (3) times a day. 1080 mL 1     Current Facility-Administered Medications   Medication Dose Route Frequency Provider Last Rate Last Admin    omalizumab [name]) injection 300 mg  300 mg Subcutaneous Q28 Days [name], [name], [name]   300 mg at 05/24/22 1138       Imaging/Tests:   MRI RLE 09/14/22  FINDINGS:        Bones: No fractures. No marrow edema or bone lesions.      Right hip: No subluxation. No effusion or intra-articular bodies.  No acetabular retroversion. Femoral head/neck offset is within normal limits.  No high-grade chondral defect. The lateral acetabular labrum appears irregular with questionable chondrolabral cleft.      Other Joints: The visualized lumbar spine, sacroiliac joints, symphysis pubis, and contralateral hip are unremarkable.      Musculature and tendons: No intramuscular edema or fatty atrophy.  Intact gluteus medius and minimus tendons. Intact hamstring, rectus femoris and iliopsoas tendons. Ischiofemoral spaces are within normal limits.      Pelvic Cavity: Intrapelvic contents are unremarkable.      Other: Visualized sciatic nerves are unremarkable.  No bursitis.  No subcutaneous mass or fluid collection.   IMPRESSION  -- Suspected right acetabular labral tear.  Consider MR arthrography for further evaluation.     MRI LLE 09/14/22  FINDINGS:     Evaluation of the bone marrow reveals normal marrow signal throughout the visualized bones of the pelvis, proximal femurs and lower lumbar spine.      The musculature of the pelvis is normal and symmetric.  Tendon origins are normal.  No evidence of bursitis.      The sacroiliac and hip joints are normal.      The sciatic nerves are normal.  Vasculature of the pelvis is normal.      Intrapelvic contents are normal.   IMPRESSION  Normal MRI of the left hip.       Lab Results   Component Value Date    PLT 272 11/19/2021     Lab Results   Component Value Date    CREATININE 0.87 05/26/2022     Lab Results   Component Value Date  ALKPHOS 103 05/26/2022    BILITOT 0.5 05/26/2022    PROT 7.0 05/26/2022    ALBUMIN 3.9 05/26/2022    ALT 17 05/26/2022    AST 22 05/26/2022     ROS:  General snoring, night sweats, difficulty sleeping  Cardiovascular none  Gastrointestinal stomach pain, diarrhea, heartburn, and gastric reflux  Skin none  Endocrine increased sweating, intolerance to heat or cold  Musculoskeletal joint aches/swelling, spasms/spacticity/cramps, back pain, muscle aches/weakness  Neurologic dizziness/vertigo, migraines/headaches, numbness/tingling  Psychiatric anxiety     PHYSICAL EXAM:  BP 116/73  - Pulse 76  - Temp 36.7 ??C (98.1 ??F) (Skin)  - Resp 16  - Ht 162.6 cm (5' 4)  - Wt 75.1 kg (165 lb 8 oz)  - LMP 07/20/2017 (Exact Date)  - SpO2 97%  - BMI 28.41 kg/m??     Wt Readings from Last 3 Encounters:   10/17/22 75.1 kg (165 lb 8 oz)   10/13/22 73.9 kg (163 lb)   08/05/22 74 kg (163 lb 3.2 oz)     GENERAL:  The patient is well developed, well-nourished, and appears to be in no apparent distress.   HEAD/NECK:    Normocephalic/atraumatic. clear sclera, pupils not pinpoint  CV:  Warm and well perfused   LUNGS:   Normal work of breathing, no supplemental O2  EXTREMITIES:  No clubbing, cyanosis noted.  NEUROLOGIC:    The patient is alert and oriented, speech fluent, normal language.   MUSCULOSKELETAL:    Motor function  preserved.   GAIT:  The patient rises from a seated position with no difficulty and ambulates with nonantalgic gait without the assistance of a walking aid.   SKIN:   No obvious rashes, lesions, or erythema.  PSY:   Appropriate affect. No overt pain behaviors. No evidence of psychomotor retardation or agitation, no signs of intoxication.

## 2022-10-18 LAB — OPIATE, URINE, QUANTITATIVE
6-MONOACETYLMRPH: <5 ng/mL (ref <5)
BUPRENORPHINE: <5 ng/mL (ref <5)
CODEINE GC/MS CONF: <25 ng/mL (ref <25)
FENTANYL, URINE GC/MS: <0.5 ng/mL (ref <0.5)
HYDROCODONE GC/MS CONF: <25 ng/mL (ref <25)
HYDROMORPHONE GC/MS CONF: <25 ng/mL (ref <25)
MORPHINE GC/MS CONF: <25 ng/mL (ref <25)
NORBUPRENORPHINE: <5 ng/mL (ref <5)
NORFENTANYL, UR GC/MS: <1 ng/mL (ref <1.0)
OPIATE INTERP: NEGATIVE
OXYCODONE (GC/MS): <25 ng/mL (ref <25)
OXYMORPHONE: <25 ng/mL (ref <25)

## 2022-10-20 ENCOUNTER — Ambulatory Visit: Admit: 2022-10-20 | Discharge: 2022-10-20 | Payer: PRIVATE HEALTH INSURANCE

## 2022-10-20 DIAGNOSIS — Z789 Other specified health status: Principal | ICD-10-CM

## 2022-10-20 DIAGNOSIS — F1320 Sedative, hypnotic or anxiolytic dependence, uncomplicated: Principal | ICD-10-CM

## 2022-10-20 DIAGNOSIS — F902 Attention-deficit hyperactivity disorder, combined type: Principal | ICD-10-CM

## 2022-10-20 LAB — AMPHETAM,UR,CONF
AMPHET INTERP: NEGATIVE
AMPHETAMINE GC/MS CONF: NEGATIVE ng/mL
MDA: NEGATIVE ng/mL
MDMA GC/MS CONF: NEGATIVE ng/mL
METHAMPHETAMINE GC/MS URINE: NEGATIVE ng/mL
PHENTERMINE: NEGATIVE ng/mL
PSEUDOEPHEDRINE: NEGATIVE ng/mL

## 2022-10-20 MED ORDER — TESTOSTERONE CYPIONATE 200 MG/ML INTRAMUSCULAR OIL
SUBCUTANEOUS | 0 refills | 70 days
Start: 2022-10-20 — End: 2022-12-29

## 2022-10-20 MED ORDER — CLONAZEPAM 0.5 MG TABLET
ORAL_TABLET | 0 refills | 0 days
Start: 2022-10-20 — End: ?

## 2022-10-20 MED ORDER — DEXTROAMPHETAMINE-AMPHETAMINE 20 MG TABLET
ORAL_TABLET | Freq: Every day | ORAL | 0 refills | 10 days | PRN
Start: 2022-10-20 — End: 2022-11-19

## 2022-10-20 MED ADMIN — triamcinolone acetonide (KENALOG) injection 20 mg: 20 mg | INTRA_ARTICULAR | @ 12:00:00 | Stop: 2022-10-20

## 2022-10-20 NOTE — Unmapped (Signed)
SPORTS MEDICINE CONSULTATION VISIT    ASSESSMENT AND PLAN      Diagnosis ICD-10-CM Associated Orders   1. Right hip pain  M25.551 Lg Joint Inj: R hip joint      2. Left hip pain  M25.552 Ambulatory referral to Orthopedic Surgery     XR Lumbar Spine 2 or 3 Views     MRI Lumbar Spine Wo Contrast           Right hip pain: Patient presents for right hip pain that has been ongoing for years.  This pain has been refractory to physical therapy, injections to the trochanteric area, and medical management.  The pain is located in the groin area and spreads in a C-shaped distribution.  He reports mechanical symptoms including painful clicking.  An MRI was performed which demonstrated a possible labral tear.  On examination the pain appears to be from an intra-articular source with positive FADIR.  Given that he has underwent physical therapy without any relief the next step would be to perform a diagnostic intra-articular injection.  If good relief with this injection we may consider getting an MRI arthrogram to further characterize the potential labral injury prior to surgical consultation.      Left hip pain: Patient describes pain that originates on the back and radiates into the buttocks and down the lateral side.  His MRI of the hip is relatively unremarkable.  He has not responded to greater trochanteric injections in the past and PT hasn't been effective.  He does have some lumbar tenderness to palpation as well as a positive slump test.  We believe his pain is originating from the lumbar spine.  Will order an MRI for further characterization and refer back to pain clinic for lumbar guided physical therapy and potential injection therapy.    No follow-ups on file.    Procedure(s):  Injection: Concha Norway Roux, MD    PROCEDURE NOTE:  Hip injection Right  Consent   After discussing the various treatment options for the condition, It was agreed that a corticosteroid injection would be the next step in treatment. The nature of and the indications for a corticosteroid and / or local anaesthetic injection were reviewed in detail with the patient today. The inherent risks of injection including infection, bleeding, allergic reaction, increased pain, incomplete relief or temporary relief of symptoms, alterations of blood glucose levels requiring careful monitoring and treatment as indicated, tendon, ligament or articular cartilage rupture or degeneration, nerve injury, skin depigmentation, and/or fatty atrophy were discussed.     Procedure :  Right Hip Intraarticular injection.  The risks and benefits of the procedure were explained,verbal consent was given, and a procedural time-out was performed. The hip joint and surrounding structures were visualized with ultrasound and the hip joint and anterior recess were identified  The site for the injection was properly marked and prepped with Chlorhexadine solution.  Using ultrasound guidance, the soft tissues down to the joint were anesthetized with ethyl chloride and 4cc of 1% Lidocaine with a 22 Rivan 3.5 inch needle, then the syringe was switched and 20mg  of Kenalog and 4cc of 0.5% Ropivicaine were injected into the hip joint using a sterile technique and a 22 Nassir 3.5 inch needle. During the injection, there was unrestricted flow and care was taken not to inject corticosteroid into the skin or subcutaneous tissues. There were no complications during the procedure.    NEED FOR SONOGRAPHIC GUIDANCE    Given the complexity of this problem, the  anatomic location of this structure, sonographic guidance is recommended to prevent injury to neurovascular structures and confirm accuracy of injection. The accuracy of doing these injections blind is poor and the benefit to the patient by using ultrasound guidance is significant to avoid complications.     Reference:  American Medical Society for Sports Medicine (AMSSM) position statement: interventional musculoskeletal ultrasound in sports medicine.  Morey Hummingbird MM, Adams E, Berkoff D, Concoff AL, [employer] Sports Med. 2014 Oct 20. pii: [id number]. doi: 10.1136/[id number]    Post procedure a sterile band-aide was applied. Post-injection instructions were given regarding post-procedure care, when to follow up in clinic and what to expect from the procedure. The patient tolerated the injection well and was discharged without complication.                   SUBJECTIVE     Chief Complaint:   Chief Complaint   Patient presents with    Left Hip - Pain     Left greater than right hip pain    Right Hip - Pain     Right hip mechanical symptoms     History of Present Illness: 42 y.o. adult who presents for evaluation and treatment of bilateral hip pain.      The patient has had hip pain since 2021.  The pain has been getting worse.  The location of the pain is at the lateral aspect of the hip on the left.  On the right, the pain is more at the groin area.    The right has popping present, gets stuck sometimes with pain.  The pain is worse when walking, hiking.  The left hip is worse when sitting for prolonged periods.  The pain actually starts in the back and radiates to the buttocks down the lateral thigh into the knee joint.    Has gotten trochanter steroid injections by Dr. Nedra Hai 05/2022; lasted for 1.5 weeks (took off edge but pain still there)  Has been through PT x 2    Has had lumbar back pain; has not had MRI of lumbar spine. Pain management team does not think hip pain related to back. No spine injections in past  Takes  gabapentin    Past Medical History:   Past Medical History:   Diagnosis Date    Anxiety     Asthma     Autoimmune autonomic neuropathy     Breast cyst     Financial difficulties     Lack of access to transportation     Migraines     Neuromuscular disorder (CMS-HCC)     Scleroderma (CMS-HCC)     Visual impairment      OBJECTIVE     Physical Exam:  Vitals:   Wt Readings from Last 3 Encounters: 10/17/22 75.1 kg (165 lb 8 oz)   10/13/22 73.9 kg (163 lb)   08/05/22 74 kg (163 lb 3.2 oz)     Estimated body mass index is 28.41 kg/m?? as calculated from the following:    Height as of 10/17/22: 162.6 cm (5' 4).    Weight as of 10/17/22: 75.1 kg (165 lb 8 oz).  Gen: Well-appearing adult in no acute distress  ZOX:WRUEA Hip Exam  Gait normal  Inspection: No deformities. No erythema, warmth, edema, ecchymosis. No atrophy or hypertrophy is appreciated.  Palpation: No reproducible tenderness, greater trochanter, iliac crest, greater trochanter, IT band/TFL, pubic bone  AROM: Flexion 135 degrees, Internal rotation  40 degrees, external rotation 50 degrees  Strength: Hip Flexors, Adductors, Abductors and Extensors 5/5  FABER - wnl  FADIR - +  Log Roll +  Stinchfield resisted hip flexion test neg  Clamshell negative      Left hip exam: Full ROM and strength. Neg FADIR, FABER. Neg Clamshell. Mild TTP GT area  + Slump. Neg SL     Imaging/other tests:   XR lumbar spine: 10/20/2022:  My interpretation: Mild lower lumbar facet disease and mild degenerative disk disease    X-ray bilateral hips: 05/27/2022  My interpretation: Normal hip joints.  No undercoverage.  No acetabular retroversion.  No significant cam or pincer morphology.    MRI right hip and MRI left hip: Intact gluteal tendons.  No definitive labral tear but some irregularity of the labrum seen on the right laterally, series 12 image 12    Orthopaedic PROMIS:  Failed to redirect to the Timeline version of the REVFS SmartLink.         No data to display                PRO Status:  Patient completed PRO.      ADMINISTRATIVE     I have personally reviewed and interpreted the images (as available).  Point-of-care ultrasound imaging is on file and stored in a permanent location (if performed).  I have personally reviewed prior records and incorporated relevant information above (as available).    MEDICAL DECISION MAKING (level of service defined by 2/3 elements) Number/Complexity of Problems Addressed 2 or more stable chronic illnesses (99204/99214)   Amount/Complexity of Data to be Reviewed/Analyzed Independent interpretation of a test performed by another physician/other qualified health care professional (99204/99214)   Risk of Complications/Morbidity/Mortality of Management Decision for MINOR Surgery (Including Injection) WITHOUT Risk Factors (99203/99213)     TIME     Total Time for E/M Services on the Date of Encounter Time-based coding not utilized for this encounter     CONSULTATION     Consultation services provided YES - Consultation was performed at the request of Dr. Oda Kilts, to whom my opinion and all services ordered and performed were communicated via written report.     MODIFIER 25 (Significant, Separately Billable Evaluation and Management)     Documentation to ensure appropriate insurance payment for medically necessary work    Per the [employer] (rev. 06/20/2021) Chapter 13, Section B. Evaluation & Management (E&M) Services, paragraph 5:   ???In general, E&M services on the same date of service as the minor surgical procedure are included in the payment for the procedure???However, a significant and separately identifiable E&M service unrelated to the decision to perform the minor surgical procedure is separately reportable with modifier 25. The E&M service and minor surgical procedure do not require different diagnoses.???    Per the American Medical Association in Reporting CPT Modifier 25 [CPT??Geophysicist/field seismologist (Online). 2023;33(11):1-12.] Page 1, Appropriate Use, paragraph 1:   ???Modifier 25 is used to indicate that a patient's condition required a significant, separately identifiable evaluation and management (E/M) service above and beyond that associated with another procedure or service being reported by the same physician or other qualified health care professional [facility]) on the same date. This service must be above and beyond the other service provided or beyond the usual preoperative and postoperative care associated with the procedure or service that was performed on that same date, and it must be substantiated by documentation in the patient's  record that satisfies the relevant criteria for the respective E/M service to be reported.???    Per the American Medical Association in Reporting CPT Modifier 25 [CPT??Geophysicist/field seismologist (Online). 2023;33(11):1-12.] Page 2, Considerations, bullet point 2 (Requires awareness of usual preoperative and postoperative services):   ???Pre- and post-operative services typically associated with a procedure include the following and cannot be reported with a separate E/M services code:   Review of patient's relevant past medical history,  Assessment of the problem area to be treated by surgical or other service,  Formulation and explanation of the clinical diagnosis,  Review and explanation of the procedure to the patient, family, or caregiver,  Discussion of alternative treatments or diagnostic options,  Obtaining informed consent,  Providing postoperative care instructions,  Discussion of any further treatment and follow up after the procedure???    As the service provider for this encounter, I attest that the patient's condition required a significant, separately identifiable, medical necessary evaluation and management service in addition to the procedure performed on the same date of service. The evaluation and management service was above and beyond the usual preoperative and postoperative care associated with the procedure. The specific elements of the encounter that represent evaluation and management service above and beyond the usual preoperative and postoperative care associated with the procedure include, but are not limited to:  Reviewed the patient's relevant past surgical history, social history and family history  Reviewed the patient's medications  Reviewed the patient's allergies  Independently obtained history of present illness and relevant review of systems  Independently reviewed laboratory, imaging and/or other data  Independently interpreted laboratory, imaging and/or other data  Decided to order/obtain additional laboratory, imaging or other data  Independently synthesized history, physical examination, laboratory, imaging and/or other data to formulate a management plan including elements separate from the procedure itself and usual postprocedural care     PROCEDURES     Lg Joint Inj: R hip joint on 10/20/2022 8:00 AM  Indications: pain  Details: 22 G needle, ultrasound-guided anterior approach  Laterality: right  Location: hip  Medications: 20 mg triamcinolone acetonide 10 mg/mL    Medical Care Team Attestation: All ProcDoc orders were read back and verbally confirmed with the procedure provider, including but not limited to patient name, medication name, dose, and route, before any actions were taken.         DME     DME ORDER:  Dx:  ,                     I saw and evaluated the patient, participating in the key portions of the E/M service.  I discussed the findings, assessment and plan with the resident and agree with resident???s findings and plan as documented in the resident???s note.  I was present for the entirety of the procedure(s). Roetta Sessions, MD    Efraim Kaufmann Chauncy Passy, MD

## 2022-10-21 NOTE — Unmapped (Addendum)
[facility]ERAP[facility] AT [facility] [place]  OUTPATIENT PHYSICAL THERAPY  10/21/2022  Note Type: Treatment Note       Patient Name: Vincent Waters  Date of Birth:1980/10/03  Diagnosis:   Encounter Diagnoses   Name Primary?    Neck pain on right side Yes    Chronic right shoulder pain     Shoulder stiffness, right     Thoracic outlet syndrome        Referring MD:  Dawna Part, *     Date of Onset of Impairment-09/18/2016  Date PT Care Plan Established or Reviewed-05/06/2022  Date PT Treatment Started-05/06/2022   Plan of Care Effective Date:          Assessment/Plan:    Assessment  Assessment details:    Vincent Waters has turned a corner and returned to his baseline.  He continues to have upper trapezius and pectoralis tightness which responds well to manual therapy.  Encouraged him to purchase a set of pulleys to help maintain his ROM improvement.  Written instructions given for additional AAROM.  He committed to performing them consistently as part of his home exercise program. All questions answered.  This patient requires skilled physical therapy services to address the outlined impairments in order to return to his desired level of function.           Impairments: decreased strength, hypertonicity, impaired ADLs, muscular restrictions, impaired flexibility, impaired sensation, joint restriction, pain, postural weakness and trigger points                      Therapy Goals      Goals:      Short Term Goals:  In 6 weeks:  Goal #1: Patient will be independent with progressively monitored home exercise program.   Baseline/Current: Patient is unfamiliar with home exercise program.     Goal #2: Patient will demonstrate cervical ROM within normal limits with minimal symptoms in order to allow for functional activities such as safe driving and looking before crossing the street.   Baseline/Current: Patient demonstrates painful and limited cervical ROM (see ROM testing).     Goal #3: Patient will demonstrate right shoulder range of motion within normal limits with minimal symptoms for proper glenohumeral joint mechanics in order to allow for daily functional tasks.   Baseline/Current: Patient has painful and limited right shoulder range of motion (see ROM testing).     Goal #4: Patient will demonstrate improved posture without cuing.  Baseline/Current: Patient has slumped posture and rounded shoulders.    Long Term Goals:  In 12 weeks:  Goal #1: Patient will demonstrate right shoulder strength within normal limits with minimal symptoms in order to allow for pain free lifting and reaching with right upper extremity.   Baseline/Current: Patient has painful and limited right shoulder strength (see manual muscle testing).     Goal #2: Patient will be able to reach over head with minimal symptoms in order to place/remove an object over shoulder height in a cabinet.  Baseline/Current: Patient has difficulty reaching over head at present.    Goal #3: Patient will return to recreational activities (working out in gym) without limit.  Baseline/Current: Patient unable to do any upper body exercises at present.    Goal #4: Patient's FOTO score will improve from 34 at initial evaluation to >/= 52 at discharge in order to demonstrate a significant improvement in self-reported outcome measures that directly result from his course of physical therapy.  Baseline/Current: FOTO = 34    Plan    Therapy options: will be seen for skilled physical therapy services    Planned therapy interventions: Body Mechanics Training, Dry Needling, Education - Patient, Home Exercise Program, Manual Therapy, Neuromuscular Re-education, Postural Training, Therapeutic Activities and Therapeutic Exercises      Frequency: 1x week    Duration in weeks: 12    Education provided to: patient.    Education provided: HEP, Body mechanics, Importance of Therapy, Treatment options and plan, Symptom management and Body awareness    Education results: verbalized good understanding, demonstrates understanding and needs further instruction.    Communication/Consultation: N/A.    Next visit plan:        Manual therapy to promote improved SterC, AcroC, GHJ motion and loosening muscles, stretching, AAROM, postural/shoulder strengthening    Total Session Time: 45    Treatment rendered today:      Therapeutic Exercise: 15  minutes  - Patient Education: Plan of Care, Home Exercise Program  - Pulleys, working through arc of motion  - Biceps Stretch Against Wall: 10 x 10  - Wall Angels (facing)    Manual Therapy: 30 minutes  - Supine AP GHJ  - Supine AP in PROM IR  - Supine PROM Horizontal Abduction into 90/90 with PA GHJ Mob  - PROM Flexion with PA at maximal flexion  - Pectoralis Minor Pin and Stretch  - PROM Abduction  - PROM Abduction with inferior glide  - Pin and Stretch scapular depressors into scaption  - PROM Elbow Extension with Wrist Extension and AP pressure on shoulder  Intramuscular Therapy: 5 minutes  Vincent Waters  was informed of the risks associated with dry needling and he signed consent form.  Patient screened for any contraindications to needling including pregnancy, infection, antibiotic use, anticoagulants use, autoimmune issues, uncontrolled diabetes, and active cancer/treatment and does not have any of these conditions.  DRY NEEDLING FOR RELEASING IDENTIFIED TRIGGER POINTS TO FACILITATE MANUAL THERAPY TECHNIQUES AND ALLOW MUSCLE TO RETURN TO OPTIMAL LENGTH (NO CHARGE).  Right upper trapezius trigger point with 50mm x .30mm with Pinch/Pull Technique established for safety - Local Twitch Response produced    Plan details: Physiotec ID: [id number]       Subjective:   History of Present Condition  Date of Surgery: 11/16/2021  Surgical Procedure: Other (Right anterior scalenectomy, middle scalenectomy, 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 right hand dominant adult who presents to outpatient physical therapy with right neck and shoulder pain that began in early 2018 when he was changing the oil in his car when it fell onto his sternum.  He went to the ER and got an x-ray which showed a sternal fracture.  Since then, he has had persistent issues and nerve pressure in his right neck/shoulder which led him to get a right anterior scalenectomy, middle scalenectomy, first rib resection and right Pectoralis minor release surgery performed on 11/16/2021 by Thora Lance, MD.  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    Date of Onset:  09/18/2016  Date is an approximation?: yes  Explanation:  Patient reports onset of symptoms in early 2018.     Subjective:     Mr. Liebold reports that his shoulder is feeling ok.  He has been doing his exercises consistently.    Pain  Current pain rating: 0  Progression: improved          Diagnostic Tests    Diagnostic Test Comments:                     Objective:     Static Posture Assessment   Shoulders-Rounded.    Palpation     Right Shoulder   Hypertonic in the pectoralis major, pectoralis minor and upper trapezius.   Tenderness of the levator scapulae.     Right Cervical/Thoracic   Hypertonic in the pectoralis major, pectoralis minor and upper trapezius.   Tenderness of the levator scapulae.     Joint Mobility  Shoulder  Right Shoulder Joint Mobility    Posterior capsule: hypomobile    General Comments   Shoulder Comments  Right shoulder sitting up and forward.                                  I attest that I have reviewed the above information.  Signed: Patton Salles, PT  10/21/2022 8:08 AM

## 2022-10-21 NOTE — Unmapped (Signed)
Thank you for coming to [facility] and our clinic today!     We aim to provide you with the highest quality, individualized care.  If you have any unanswered questions after the visit, please do not hesitate to reach out to us on MyChart or leave a message for the nurse.  ?  MyChart messages: These messages can be sent to your provider and will be checked by their clinical support staff.? The messages are checked throughout the day during normal business hours from 8:30 am-4:00 pm Monday-Friday, however responses may take up to 48 hours.? Please use this method of communication for non-urgent and non-emergent concerns, questions, refill requests or inquiries only.? ?Our team will help respond to all of your questions.? Please note that you may be asked to see a provider by either a telehealth or in person visit if it is deemed your questions are best handled in the clinic setting in person.??  ?  Please keep in mind, these messages are not real time communications, so be patient when waiting for a response.    If you do not have access to MyChart, do not know how to use MyChart or have an issue that may require more extensive discussion, please call the nurses' call line: [phone].? This line is checked throughout the day and will be responded to as time allows.? Please note that return calls could take up to 48 hours, depending on the nature of the need.?  ?  If you have an issue that requires emergent attention that cannot wait; either call the Orthopaedics resident on call at [phone], consider coming to our [facility] walk-in clinic, or go to the nearest Emergency Department.    If you need to schedule future appointments, please call [phone].     We look forward to seeing you again in the future and appreciate you choosing [facility] for your care!    Thank you,                We provide innovative and comprehensive patient centered care that is supported by evidence-based research RESEARCH PARTICIPATION    Please check out our current research studies to see if you or someone you know may qualify at:    [email or web address]

## 2022-10-24 DIAGNOSIS — F1320 Sedative, hypnotic or anxiolytic dependence, uncomplicated: Principal | ICD-10-CM

## 2022-10-24 DIAGNOSIS — F902 Attention-deficit hyperactivity disorder, combined type: Principal | ICD-10-CM

## 2022-10-24 DIAGNOSIS — Z789 Other specified health status: Principal | ICD-10-CM

## 2022-10-24 MED ORDER — CLONAZEPAM 0.5 MG TABLET
ORAL_TABLET | 0 refills | 0 days
Start: 2022-10-24 — End: ?

## 2022-10-24 MED ORDER — TESTOSTERONE CYPIONATE 200 MG/ML INTRAMUSCULAR OIL
SUBCUTANEOUS | 0 refills | 70.00000 days | Status: CN
Start: 2022-10-24 — End: 2023-01-02

## 2022-10-24 MED ORDER — DEXTROAMPHETAMINE-AMPHETAMINE 20 MG TABLET
ORAL_TABLET | Freq: Every day | ORAL | 0 refills | 10.00000 days | Status: CN | PRN
Start: 2022-10-24 — End: 2022-11-23

## 2022-10-24 NOTE — Unmapped (Signed)
Pt is calling, please fill meds

## 2022-10-25 ENCOUNTER — Ambulatory Visit: Admit: 2022-10-25 | Payer: PRIVATE HEALTH INSURANCE

## 2022-10-25 MED ORDER — CLONAZEPAM 0.5 MG TABLET
ORAL_TABLET | 0 refills | 0 days | Status: CP
Start: 2022-10-25 — End: ?
  Filled 2022-10-26: qty 30, 30d supply, fill #0

## 2022-10-25 MED ORDER — TESTOSTERONE CYPIONATE 200 MG/ML INTRAMUSCULAR OIL
SUBCUTANEOUS | 0 refills | 70 days | Status: CP
Start: 2022-10-25 — End: 2023-01-03
  Filled 2022-10-26: qty 2, 56d supply, fill #0

## 2022-10-25 MED ORDER — DEXTROAMPHETAMINE-AMPHETAMINE 20 MG TABLET
ORAL_TABLET | Freq: Every day | ORAL | 0 refills | 10 days | Status: CP | PRN
Start: 2022-10-25 — End: 2022-11-24
  Filled 2022-10-26: qty 10, 10d supply, fill #0

## 2022-10-25 NOTE — Unmapped (Signed)
The patient has called back requesting a refill and a  message sent.

## 2022-11-01 NOTE — Unmapped (Signed)
[facility]  [address]                                  [place], [place] [address]    [phone]    [facility] did not show for their scheduled Physical Therapy follow-up session.  Patient called late to cancel due to sickness.  Please contact me if you have any questions or concerns.    Thank you for this referral,     Signed: Patton Salles, PT  11/01/2022 2:42 PM

## 2022-11-02 NOTE — Unmapped (Signed)
Pre-procedure call made and RN spoke with patient.    Denies recent infection/antibiotics.   Denies being diabetic.  Denies taking any recent blood thinners/NSAIDs.   Denies any electronic implants        Patient informed to arrive 30 minutes before procedure appointment time.  Patient verbalized understanding to all.

## 2022-11-03 MED FILL — IBUPROFEN 800 MG TABLET: ORAL | 20 days supply | Qty: 60 | Fill #2

## 2022-11-04 ENCOUNTER — Ambulatory Visit
Admit: 2022-11-04 | Discharge: 2022-11-05 | Payer: PRIVATE HEALTH INSURANCE | Attending: Anesthesiology | Primary: Anesthesiology

## 2022-11-04 DIAGNOSIS — G540 Brachial plexus disorders: Principal | ICD-10-CM

## 2022-11-04 DIAGNOSIS — G528 Disorders of other specified cranial nerves: Principal | ICD-10-CM

## 2022-11-04 DIAGNOSIS — M7918 Myalgia, other site: Principal | ICD-10-CM

## 2022-11-04 MED ADMIN — dexAMETHasone (DECADRON) injection 10 mg: 10 mg | @ 12:00:00 | Stop: 2022-11-04

## 2022-11-04 MED ADMIN — lidocaine (XYLOCAINE) 5 mg/mL (0.5 %) injection 5 mL: 5 mL | @ 12:00:00 | Stop: 2022-11-04

## 2022-11-04 NOTE — Unmapped (Signed)
Discharge instructions given to patient and he verbalized understanding. Patient is by himself

## 2022-11-04 NOTE — Unmapped (Signed)
PROCEDURE; Right spinal accessory nerve block under ultrasound guidance  PRE-PROCEDURE DIAGNOSIS: Spinal accessory neuralgia  POST-PROCEDURE DIAGNOSIS: Same    PERFORMED BY: Dr. Criss Rosales  ASSISTANT: None  Anesthesia: Local    INTERIM HISTORY:     Vincent Waters is a 42 y.o. 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 Gayla Doss in May 2023.  He was first seen by our clinic in March 2023 for diagnostic injections.  His range of motion has an increased postoperatively, but still has tight, spasming pain in the right neck and shoulder.  His primary pain generator includes myofascial pain post-TOS decompression. Patient presents with worsened pain localized to right neck and shoulder area. He denies radiation of pain down his RUE. TPI injections have been helpful but the bulk of the pain is localized to the trapezius, so today he presents for a right-sided spinal accessory nerve block with steroid to see if we can get more sustained relief for this aspect of his pain.    DESCRIPTION OF THE PROCEDURE:   The above procedure was decided to be performed and discussed with the patient.  Informed consent was obtained and potential risks discussed including, but not limited to: bleeding, bruising, severe allergic reaction to components of the injection materials, infection, nerve damage, the possibility of no benefit (pain relief) derived from the injection, or in rare occasions worsening of pain. We discussed risk of pigment change from superficial steroid injection.  Questions were answered to the patient's satisfaction and the patient wishes to proceed.  Alternative options for treatment have previously been discussed and explored with the patient. The patient is not taking antiplatelet or anticoagulation medications and does not have a driver today.    An PIV was not placed and the patient was taken to the procedure room and placed supine with the head turned to the contralateral side . The procedure was performed under  ultrasound  guidance. The patient's heart rate, blood pressure, and oxygen saturation were continuously monitored throughout the procedure. A timeout was performed and the correct side confirmed with the patient. Sedation was not given.    The area of interest was sterilely prepped with chlorhexidine and sterilely draped. The physician wore a hat, mask and sterile gloves.    The area was scanned with the ultrasound to identify the surrounding structures and the nerve. A 20 Ezekiel 2 inch needle was inserted into the  right lateral neck between the trapezius and the sternoclidomastoid muscles   in plane using  ultrasound  for visualization. Negative aspiration was confirmed. Then  5  ml of  4cc 2% lidocaine and 1cc dexamethasone 10mg /cc  was injected and the needle(s) was/were removed intact    The patient did tolerate the procedure well and there were no apparent complications. All injection sites were sterilely dressed. The patient was discharged after an appropriate period of observation and post procedural education was given.      Pre-procedure pain score was 7/10  Post-procedure pain score was 0/10    DISPOSITION:     Clinic appt 01/17/23.  Can consider repeat TPI.  Can consider pulsed RFA of the SAN or repeat CSI (if it lasts 3 months).

## 2022-11-04 NOTE — Unmapped (Signed)
POST PROCEDURE INSTRUCTIONS   You may apply an ice pack 20-30 minutes at a time to the injection site if you experience soreness.     Keep the injection site clean and dry. You make remove the band-aid one day following the procedure.     You may take a shower but AVOID getting in to baths, pools or whirlpools for 48 HOURS AFTER THE PROCEDURE.     Remember that it takes a few days, even up to a week for the steroid medicine to reduce inflammation and pain. If you are diabetic, the steroid medicine may cause your blood sugar levels to increase. Please contact your internist regarding medication adjustments.    ACTIVITY   Refrain from heavy activity for the next 24 to 48 hours. General walking is okay. You may resume your normal activities the day following the procedure.   You may start or resume your individualized exercise program or physical therapy 48 hours after the procedure.   MEDICATIONS   Please note that it is okay to continue other prescribed medications (blood pressure, insulin, water pill, depression/anxiety pill, etc.) as well as other prescribed pain medications such as Neurontin, Lyrical, Celebrex, Ultram, Vicodin, Norco and acetaminophen (Tylenol).   SIDE EFFECTS   Increase in pain during the first 24 to 48 hours.     Trouble sleeping during the first one or two nights after the procedure-this is an effect of the steroid medicine.     Facial flushing (it feels like you have a fever, but you do not) - this is an effect of the steroid medicine.     You might experience:   1. Mild to moderate swelling at the joint.   2. Possible bruising at the injection site.     WHEN TO CALL THE DOCTOR/NURSE   Severe pain, worse or different that the pain you had before the procedure.     Fever or chills.     Redness, or swelling around the injection site.     Call the Pain Management  Procedural Nurses [phone] during normal business hours (7 am-3 pm). If it is AFTER HOURS or during a weekend or holiday, call the hospital operator and ask for the Anesthesia Pain physician on call at [phone].   FOR EMERGENCIES, CALL 911 OR GO TO THE NEAREST HOSPITAL EMERGENCY DEPARTMENT.   ?   TO SCHEDULE APPOINTMENTS OR FOR QUESTIONS RELATED TO MEDICATIONS   Call the [facility] at [phone]

## 2022-11-04 NOTE — Unmapped (Signed)
Addended by: Carolynne Edouard on: 11/04/2022 10:51 AM     Modules accepted: Orders

## 2022-11-04 NOTE — Unmapped (Signed)
Addended by: Colleen Can R on: 11/04/2022 10:27 AM     Modules accepted: Orders

## 2022-11-07 ENCOUNTER — Ambulatory Visit
Admit: 2022-11-07 | Discharge: 2022-12-01 | Payer: PRIVATE HEALTH INSURANCE | Attending: Rehabilitative and Restorative Service Providers" | Primary: Rehabilitative and Restorative Service Providers"

## 2022-11-07 ENCOUNTER — Ambulatory Visit
Admit: 2022-11-07 | Payer: PRIVATE HEALTH INSURANCE | Attending: Rehabilitative and Restorative Service Providers" | Primary: Rehabilitative and Restorative Service Providers"

## 2022-11-07 NOTE — Unmapped (Signed)
[facility]  OUTPATIENT PHYSICAL THERAPY  11/07/2022  Note Type: Progress Note  Note Date Range: 05/06/2022 to 11/07/2022    Patient Name: Vincent Waters  Date of Birth:[birth year]  Diagnosis:   Encounter Diagnoses   Name Primary?    Neck pain on right side Yes    Chronic right shoulder pain     Shoulder stiffness, right     Thoracic outlet syndrome        Referring MD:  Dawna Part, *     Date of Onset of Impairment-09/18/2016  Date PT Care Plan Established or Reviewed-11/07/2022  Date PT Treatment Started-05/06/2022   Plan of Care Effective Date:          Assessment/Plan:    Assessment  Assessment details:    Vincent Waters has made quite a bit of progress with his physical therapy.  Pain has come down, shoulder ROM has improved and his shoulder is stronger.  He continues to have some upper trapezius spasms that happen with lifting and over doing activities.  This is likely due to a residual muscle imbalance between elevators and depressors, and/or aberrant motor pattern left over from the condition prior to surgery.  Generally speaking, though, he is stronger and can perform all of his daily activities without too much of a problem.  We agreed to take a few more appointments to address the muscle imbalance to see if we can improve the arbitrary spasms that occur.  All questions answered.  This patient requires skilled physical therapy services to address the outlined impairments in order to return to his desired level of function.           Impairments: decreased strength, hypertonicity, pain and postural weakness                      Therapy Goals      Goals:      Short Term Goals: Updated 11/07/2022.   In 6 weeks:  Goal #1: Patient will be independent with progressively monitored home exercise program.   Baseline/Current: Patient is familiar with home exercise program and performs it consistently.     Goal #2: Patient will demonstrate cervical ROM within normal limits with minimal symptoms in order to allow for functional activities such as safe driving and looking before crossing the street.   Baseline/Current: COMPLETE.  Patient now has normal cervical ROM (see ROM testing).     Goal #3: Patient will demonstrate right shoulder range of motion within normal limits with minimal symptoms for proper glenohumeral joint mechanics in order to allow for daily functional tasks.   Baseline/Current: COMPLETE.  Patient has normal right shoulder range of motion (see ROM testing).     Goal #4: Patient will demonstrate improved posture without cuing.  Baseline/Current: PARTIALLY COMPLETE.  Patient has intermittently improved posture with right shoulder still sitting forward and ups.    Long Term Goals: Updated 11/07/2022.   In 12 weeks:  Goal #1: Patient will demonstrate right shoulder strength within normal limits with minimal symptoms in order to allow for pain free lifting and reaching with right upper extremity.   Baseline/Current: NEARLY COMPLETE.  Patient has significantly improved right shoulder strength with residual weakness (see manual muscle testing).     Goal #2: Patient will be able to reach over head with minimal symptoms in order to place/remove an object over shoulder height in a cabinet.  Baseline/Current: COMPLETE.  Patient can now reach over head  without issue.    Goal #3: Patient will return to recreational activities (working out in gym) without limit.  Baseline/Current: PARTIALLY COMPLETE.  Patient has returned to limited weight lifting.    Goal #4: Patient's FOTO score will improve from 34 at initial evaluation to >/= 52 at discharge in order to demonstrate a significant improvement in self-reported outcome measures that directly result from his course of physical therapy.   Baseline/Current: IN PROGRESS.  FOTO = 34    Plan    Therapy options: will be seen for skilled physical therapy services    Planned therapy interventions: Body Mechanics Training, Dry Needling, Education - Patient, Home Exercise Program, Manual Therapy, Neuromuscular Re-education, Postural Training, Therapeutic Activities and Therapeutic Exercises      Frequency: 1x week    Duration in weeks: 6    Education provided to: patient.    Education provided: HEP, Estate manager/land agent, Treatment options and plan, Symptom management and Body awareness    Education results: verbalized good understanding, demonstrates understanding and needs further instruction.    Communication/Consultation: N/A.    Next visit plan:        Posterior shoulder/depressor strengthening.    Total Session Time: 30    Treatment rendered today:      Therapeutic Exercise: 15  minutes  - Patient Education: Assessment, Plan of Care, Home Exercise Program    Plan details: Physiotec ID: [id number]       Subjective:   History of Present Condition  Date of Surgery: 11/16/2021  Surgical Procedure: Other (Right anterior scalenectomy, middle scalenectomy, 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 right hand dominant adult who presents to outpatient physical therapy with right neck and shoulder pain that began in early 2018 when he was changing the oil in his car when it fell onto his sternum.  He went to the ER and got an x-ray which showed a sternal fracture.  Since then, he has had persistent issues and nerve pressure in his right neck/shoulder which led him to get a right anterior scalenectomy, middle scalenectomy, first rib resection and right Pectoralis minor release surgery performed on 11/16/2021 by Thora Lance, MD.  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    Date of Onset:  09/18/2016  Date is an approximation?: yes  Explanation:  Patient reports onset of symptoms in early 2018.     Subjective:     Vincent Waters reports that he has improved to about 60% of full function since starting physical therapy.  He has noticed improvements in pain, ROM and strength.  He continues to have some minor pain in the mornings and the evenings.  He can now perform all daily functional activities without issue on days his shoulder is not spasming.  His biggest issue at present is that he continues to get arbitrary right upper trapezius spasms.  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  Patient reports compliance with home exercise program.     Pain  Current pain rating: 0  At best pain rating: 0  At worst pain rating: 7  Location: right neck/shoulder  Quality: aching and other (annoying, irritating)  Progression: improved  Objective:     Static Posture Assessment   Shoulders-Rounded.    Palpation     Right Shoulder   Hypertonic in the upper trapezius.     Right Cervical/Thoracic   Hypertonic in the upper trapezius.     Range of Motion  Shoulder  Active ROM  Right Shoulder-Active ROM   Flexion: 164 degrees   Extension: 64 degrees   Abduction: 160 degrees   External rotation 90??: 53 degrees  Passive ROM  Right Shoulder-Passive ROM   Flexion: 190 degrees   Abduction: 180 degrees     Joint Mobility  Shoulder  Right Shoulder Joint Mobility    Posterior capsule: WFL    Strength  Right Shoulder   Planes of Motion   Flexion: 4+   Extension: 4   Abduction: 4+   External rotation at 0??: 4+   External rotation at 90??: 4+   Internal rotation at 0??: 4+   Internal rotation at 90??: 4+   Isolated Muscles   Latissimus: 4   Lower trapezius: 4+   Middle trapezius: 4   Rhomboids: 4+     General Comments   Shoulder Comments  Right shoulder sitting up and forward.                                  I attest that I have reviewed the above information.  Signed: Patton Salles, PT  11/07/2022 5:26 PM

## 2022-11-08 DIAGNOSIS — M25552 Pain in left hip: Principal | ICD-10-CM

## 2022-11-08 DIAGNOSIS — M25551 Pain in right hip: Principal | ICD-10-CM

## 2022-11-10 NOTE — Unmapped (Signed)
[facility] Refill Coordination Note    Vincent Waters, Vincent Waters: 1981-02-08  Phone: [phone] (home) [phone] (work)      All above HIPAA information was verified with patient.         11/09/2022     9:43 PM   Specialty Rx Medication Refill Questionnaire   Which Medications would you like refilled and shipped? Xolair   Please list all current allergies: Latex   Have you missed any doses in the last 30 days? No   Have you had any changes to your medication(s) since your last refill? No   How many days remaining of each medication do you have at home? None   If receiving an injectable medication, next injection date is 10/20/2022   Have you experienced any side effects in the last 30 days? No   Please enter the full address (street address, city, state, zip code) where you would like your medication(s) to be delivered to. [address] [place] [address]   Please specify on which day you would like your medication(s) to arrive. Note: if you need your medication(s) within 3 days, please call the pharmacy to schedule your order at [phone]  11/11/2022   Has your insurance changed since your last refill? No   Would you like a pharmacist to call you to discuss your medication(s)? No   Do you require a signature for your package? (Note: if we are billing Medicare Part B or your order contains a controlled substance, we will require a signature) No         Completed refill call assessment today to schedule patient's medication shipment from the [facility] [phone]).  All relevant notes have been reviewed.       Confirmed patient received a Conservation officer, historic buildings and a Surveyor, mining with first shipment. The patient will receive a drug information handout for each medication shipped and additional FDA Medication Guides as required.         REFERRAL TO PHARMACIST     Referral to the pharmacist: Not needed      [facility]     Shipping address confirmed in Epic. Delivery Scheduled: Yes, Expected medication delivery date: 11/11/22.     Medication will be delivered via Same Day Courier to the prescription address in Epic WAM.    [facility] Specialty Technician

## 2022-11-11 ENCOUNTER — Ambulatory Visit: Admit: 2022-11-11 | Discharge: 2022-11-12 | Payer: PRIVATE HEALTH INSURANCE

## 2022-11-11 MED FILL — XOLAIR 150 MG/ML SUBCUTANEOUS SYRINGE: SUBCUTANEOUS | 28 days supply | Qty: 2 | Fill #1

## 2022-11-15 ENCOUNTER — Ambulatory Visit: Admit: 2022-11-15 | Payer: PRIVATE HEALTH INSURANCE | Attending: Clinical | Primary: Clinical

## 2022-11-17 ENCOUNTER — Ambulatory Visit: Admit: 2022-11-17 | Discharge: 2022-11-17 | Payer: PRIVATE HEALTH INSURANCE

## 2022-11-17 ENCOUNTER — Ambulatory Visit
Admit: 2022-11-17 | Discharge: 2022-11-17 | Payer: PRIVATE HEALTH INSURANCE | Attending: Specialist | Primary: Specialist

## 2022-11-17 LAB — CREATININE
CREATININE: 1 mg/dL
EGFR CKD-EPI (2021) MALE: >90 mL/min/{1.73_m2} (ref >=60)

## 2022-11-17 NOTE — Unmapped (Unsigned)
Return TOS    Patient Name: Vincent Waters DOB:April 22, 1981  Date of Encounter: 11/17/2022    Assessment:  Patient is a 42 y.o. male s/p right TOS decompression with pec minor release 11/16/21. Patient is doing well with some residual trap contraction. We recommend patient work with physical therapy and pain management to release his trapezius muscle. Medications were reviewed, defer management to pain management.     Plan:  - RTC in 1 year for re-evaluation. Return precautions reviewed.   - Continue PT   - Message sent to [employer].   - Improve ergonomics at home and at work, including acquiring the most correct posture to allow relief of the symptoms.     HPI: Vincent Waters is a 42 y.o. 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 Patient reports that he is doing well and enjoying PT. He has had some residual trap tightness that is causing him extreme discomfort. Patient has tried dry needling, manipulation, TPIs, steroid shots, and nerve blocks. Patient has been able to return to work.     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 For 4 years, he has had limited shoulder mobility, right neck pain and stiffness, shoulder pain radiating down the arm, and tingling and cramping in the fingers in an ulnar distribution. (Patient initially complained of RUE sx but at follow up endorses a component of LUE sx as well). He was previously seen at [facility] and had glenohumeral injections and PT for possible frozen shoulder with some improvement. Patient saw Dr. Clydene Pugh in Ortho in 09/2019 who felt sx were possibly 2/2 TOS vs. cervical radiculopathy. EMG was ordered at that time but never obtained. Patient has also considered rotator cuff surgery with a provider in [place]. Previous shoulder MRIs have found mild tendinosis of supraspinatus tendon. Patient has tried PT and dry needling without relief.     Past Medical History:   Past Medical History:   Diagnosis Date    Anxiety     Asthma     Autoimmune autonomic neuropathy     Breast cyst     Financial difficulties     Lack of access to transportation     Migraines     Neuromuscular disorder (CMS-HCC)     Scleroderma (CMS-HCC)     Visual impairment        Past Surgical History:  Past Surgical History:   Procedure Laterality Date    CHOLECYSTECTOMY  2012    HYSTERECTOMY Bilateral     08/2017    MASTECTOMY      04/2018    OOPHORECTOMY  2005    laparotomy    PR CYSTOURETHROSCOPY N/A 08/30/2017    Procedure: CYSTOURETHROSCOPY (SEPARATE PROCEDURE);  Surgeon: Forbes Cellar, MD;  Location: [facility] OR [facility];  Service: Advanced Laparoscopy    PR EXCISION TURBINATE,SUBMUCOUS Bilateral 01/04/2021    Procedure: SUBMUCOUS RESECTION INFERIOR TURBINATE, PARTIAL OR COMPLETE, ANY METHOD;  Surgeon: Adam Swaziland Kimple, MD;  [facility];  Service: ENT    PR INCISE TENDON/MUSCLE,SHLDR,SINGLE Right 11/16/2021    Procedure: RIGHT PEC MINOR RELEASE;  Surgeon: Thora Lance, MD;  Location: MAIN OR [facility];  Service: Vascular    PR LAPAROSCOPY W TOT HYSTERECTUTERUS <=250  Gay Filler TUBE/OVARY N/A 08/30/2017    Procedure: LAPAROSCOPY, SURGICAL, WITH TOTAL HYSTERECTOMY, FOR UTERUS 250 G OR LESS; W/REMOVAL TUBE(S) AND/OR OVARY(S);  Surgeon: Forbes Cellar, MD;  [facility];  Service: Advanced Laparoscopy    PR MASTECTOMY, SIMPLE, COMPLETE Bilateral 01/09/2020    Procedure: MASTECTOMY, SIMPLE, COMPLETE;  Surgeon: Carola Rhine, MD;  Location: MAIN OR [place];  Service: Plastics    PR NEUROPLASTY BRACHIAL PLEXUS,OPEN Right 11/16/2021    Procedure: RIGHT TOS DECOMPRESSION;  Surgeon: Thora Lance, MD;  Location: MAIN OR ;  Service: Vascular    PR REPAIR OF NASAL SEPTUM N/A 01/04/2021    Procedure: SEPTOPLASTY OR SUBMUCOUS RESECTION, WITH OR WITHOUT CARTILAGE SCORING, CONTOURING OR REPLACEMENT WITH GRAFT;  Surgeon: Adam Swaziland Kimple, MD;  [facility];  Service: ENT    PR UPPER GI ENDOSCOPY,BIOPSY N/A 08/13/2019    Procedure: UGI ENDOSCOPY; WITH BIOPSY, SINGLE OR MULTIPLE;  Surgeon: Alfred Levins, MD;  Location: [facility] PROCED[facility];  Service: Gastroenterology    SINUS SURGERY         Social History:  Patient  reports that he has never smoked. He has never used smokeless tobacco. He reports that he does not currently use alcohol. He reports current drug use. Drug: Marijuana.    ROS:  Balance of systems reviewed x 10 and negative except as noted in HPI.    DASH Score  Date   30 11/17/2022     Physical Examination:   Vitals:    11/17/22 0838   BP: 123/86   Pulse: 77   Temp: 37 ??C (98.6 ??F)     General: Well appearing, no acute distress, neuro intact  Psych: Awake, Alert , Oriented by 3. Affect is appropriate.  Neck: Supple, no evidence of JVD, no evidence of thyromegaly, no cervical bruits  Cardiac: RRR  Lungs: Easy work of breathing, CTA Bilaterally  Upper extremities: Warm and well perfused. Radial artery pulses bilaterally 2+. Limited RUE ROM and shoulder mobility.   ROOS: negative    Imaging (independently reviewed):    None      30 minutes spent face-to-face counseling and coordination of care and greater than 50% of the visit was spent face-to-face    ______________________________________________________________________    Documentation assistance was provided by Eugenia Pancoast, Scribe, on Nov 17, 2022 at 8:56 AM for  Thora Lance, MD    {*** NOTE TO PROVIDER: PLEASE ADD ATTESTATION NOTING YOU AGREE WITH SCRIBE DOCUMENTATION}

## 2022-11-17 NOTE — Unmapped (Signed)
[facility]  Established Patient Clinic Note    Assessment/Plan:   Vincent Waters is a 41 y.o.adult who presents for follow-up.    Problem List Items Addressed This Visit       Bipolar I disorder (CMS-HCC)    Relevant Orders    Ambulatory referral to Psychiatry    On pre-exposure prophylaxis for HIV    Relevant Orders    Creatinine (Completed)     Other Visit Diagnoses       Ear pain, bilateral    -  Primary    Relevant Orders    Ambulatory referral to ENT    Vaginal discharge        Relevant Orders    Chlamydia/Gonorrhoeae NAA    Chlamydia/Gonorrhoeae NAA (Completed)    Chlamydia/Gonorrhoeae NAA    Chlamydia/Gonorrhoeae NAA (Completed)    Wet Prep, Genital (Completed)    High risk sexual behavior, unspecified type        Relevant Medications    doxycycline (VIBRA-TABS) 100 MG tablet    Routine screening for STI (sexually transmitted infection)        Relevant Orders    Chlamydia/Gonorrhoeae NAA    Chlamydia/Gonorrhoeae NAA (Completed)    Chlamydia/Gonorrhoeae NAA    Chlamydia/Gonorrhoeae NAA (Completed)    Ambulatory referral to Rheumatology    Scleroderma (CMS-HCC)        Relevant Orders    Ambulatory referral to Rheumatology          Attending: Dr. Marilu Favre    - Current meds:   Current Outpatient Medications:     albuterol 2.5 mg /3 mL (0.083 %) nebulizer solution, Inhale 3 mL (2.5 mg total) by nebulization every six (6) hours as needed for wheezing or shortness of breath., Disp: 90 mL, Rfl: 1    azelastine (ASTELIN) 137 mcg (0.1 %) nasal spray, Instill 2 sprays into each nostril two (2) times a day as needed for rhinitis., Disp: 30 mL, Rfl: 1    budesonide-formoterol (SYMBICORT) 160-4.5 mcg/actuation inhaler, Inhale 1 puff by mouth Two (2) times a day., Disp: 10.2 g, Rfl: 5    cetirizine (ZYRTEC) 10 MG tablet, Take 1 tablet (10 mg total) by mouth daily., Disp: 30 tablet, Rfl: 12    clindamycin (CLEOCIN T) 1 % lotion, 1 Application as needed., Disp: , Rfl:     clonazePAM (KLONOPIN) 0.5 MG tablet, Take half tablet (0.25 mg total) by mouth once daily as needed for anxiety. For the next two weeks, every other day, may take an additional half a tablet as needed. After two weeks, decrease to taking half a tablet once daily as needed, without the additional dose, Disp: 30 tablet, Rfl: 0    doxycycline (VIBRA-TABS) 100 MG tablet, Take 200 mg (2 tablets) within 24-72 hours of any condomless sexual encounter., Disp: 40 tablet, Rfl: 0    emtricitabine-tenofovir alafen (DESCOVY) 200-25 mg tablet, Take 1 tablet by mouth daily., Disp: 90 tablet, Rfl: 0    EPINEPHrine (EPIPEN) 0.3 mg/0.3 mL injection, Inject 0.3 mL (0.3 mg total) under the skin once for 1 dose., Disp: 2 each, Rfl: 0    famotidine (PEPCID) 40 MG tablet, Take 1 tablet (40 mg total) by mouth every evening., Disp: 30 tablet, Rfl: 1    gabapentin (NEURONTIN) 250 mg/5 mL oral solution, Take 12 mL (600 mg total) by mouth Three (3) times a day., Disp: 1080 mL, Rfl: 2    gabapentin (NEURONTIN) 250 mg/5 mL oral solution, Take 12 mL (600  mg total) by mouth Three (3) times a day., Disp: 1080 mL, Rfl: 1    ibuprofen (MOTRIN) 800 MG tablet, Take 1 tablet (800 mg total) by mouth every eight (8) hours as needed for pain., Disp: 60 tablet, Rfl: 2    inhalational spacing device Spcr, Use as directed with albuterol and symbicort, Disp: 1 each, Rfl: 1    nebulizer and compressor (COMP-AIR NEBULIZER COMPRESSOR) Devi, 1 each by Miscellaneous route nightly as needed., Disp: 1 each, Rfl: 0    needle, disp, 25 Alexande (BD REGULAR BEVEL NEEDLES) 25 Demone x 5/8 Ndle, For subcutaneous hormone injection., Disp: 25 each, Rfl: 0    nifedipine 0.3% lidocaine 1.5% in petrolatum ointment, Apply a pea-size amount to anal area 3 times a day, Disp: 100 g, Rfl: 0    omalizumab (XOLAIR) 150 mg/mL syringe, Inject 2 mL (300 mg total) under the skin every twenty-eight (28) days., Disp: 2 mL, Rfl: 12    pantoprazole (PROTONIX) 20 MG tablet, Take 1 tablet (20 mg total) by mouth daily., Disp: 30 tablet, Rfl: 1    prucalopride 2 mg Tab, Take 1 tablet (2 mg total) by mouth daily., Disp: 90 tablet, Rfl: 0    safety needles (BD ECLIPSE) 25 Yasuo x 1 Ndle, use for testosterone, Disp: 12 each, Rfl: 0    safety needles (BD SAFETYGLIDE NEEDLE) 18 Brelan x 1 1/2 Ndle, For drawing hormone injection, Disp: 25 each, Rfl: 0    syringe, disposable, (EASY TOUCH LUER LOCK SYRINGE) 1 mL Syrg, Use for weekly hormone injection., Disp: 4 each, Rfl: 0    syringe, disposable, (EASY TOUCH LUER LOCK SYRINGE) 1 mL Syrg, Use for testosterone, Disp: 12 each, Rfl: 0    syringe, disposable, 2.5 mL Syrg, Use weekly, Disp: 30 Syringe, Rfl: 2    testosterone cypionate (DEPOTESTOTERONE CYPIONATE) 200 mg/mL injection, Inject 0.2 mL (40 mg total) under the skin every seven (7) days., Disp: 2 mL, Rfl: 0    triamcinolone (KENALOG) 0.1 % cream, Apply topically Two (2) times a day., Disp: 15 g, Rfl: 0    Current Facility-Administered Medications:     omalizumab [name]) injection 300 mg, 300 mg, Subcutaneous, Q28 Days, [name], [name], [name], 300 mg at 05/24/22 1138    # Ear pain, bilateral  Pt with longstanding bilateral ear pain for which he needs ENT follow-up, so will re-place referral today. No hearing changes. Exam today initially with bilateral impacted cerumen. Following irrigation, noted moderate irritation and some scant bleeding of external auditory canal, with normal TM bilaterally. Unclear if bleeding 2/2 longstanding cerumen impaction, edema from severe allergic rhinitis, or other cause.   - Ambulatory referral to ENT; Future    # Vaginal discharge - routine screening for STI - recurrent gonorrheal infection  Pt with increased, non-odorous vaginal discharge over the past week or so. Is currently sexually active with predominantly male partners, no known STI exposures since last visit. Is taking PrEP (no refill needed today, labs up to date), and after discussion of risks/benefits would like to start Doxy PEP today as well. Will also check wet prep and GC/CT today given discharge.  - Wet prep + GC/CT NAAT per above given discharge  - DoxyPEP: doxycycline 200 mg within 24-72h of condomless sexual encounter  - HIV + RPR   - Continue PrEP, no refill needed today    # Bipolar I disorder (CMS-HCC)  Pt desires to establish primary psychiatric care given bipolar I, GAD, and PTSD refractory to multiple medications  including Lexapro, abilify, lamictal, lithium, depakote, and trileptal.  - Ambulatory referral to Psychiatry; Future    # Scleroderma (CMS-HCC)  Pt requests rheum referral to re-establish care, in addition to ANA.   - Anti-Nuclear Antibody (ANA)  - Ambulatory referral to Rheumatology; Future    HEALTH MAINTENANCE ITEMS STILL DUE:  There are no preventive care reminders to display for this patient.  Follow-up: No follow-ups on file.    Future Appointments   Date Time Provider Department Center   11/25/2022  8:00 AM Seymour Bars, PT [facility]   12/01/2022  1:00 PM Seymour Bars, PT [facility]   01/02/2023  8:00 AM [name], MD [facility]   01/12/2023  9:45 AM Ralene Ok, Adam Swaz[place], MD [facility]   01/17/2023  8:30 AM Galen Daft, FNP [facility]   02/22/2023 10:50 AM Mahoro, Champ Mungo, MD Ranae Pila [facility]   07/25/2023  8:30 AM Almodovar Sunday Shams, MD [facility]   11/16/2023  9:00 AM Thora Lance, MD [facility]       Subjective   Vincent Waters is a 42 y.o. adult  coming to clinic today for the following issues:    Chief Complaint   Patient presents with    Follow-up     HPI: see above for more information.     I have reviewed the problem list, medications, and allergies and have updated/reconciled them if needed.    Vincent Waters  reports that he has never smoked. He has never used smokeless tobacco.  Health Maintenance   Topic Date Due    Lipid Screening  01/22/2025    DTaP/Tdap/Td Vaccines (2 - Td or Tdap) 08/19/2027    Hepatitis C Screen  Completed    COVID-19 Vaccine  Completed    Influenza Vaccine  Completed    Pneumococcal Vaccine 0-64  Aged Out       Objective     VITALS: BP 121/81 (BP Site: L Arm, BP Position: Sitting, BP Cuff Size: Medium)  - Pulse 80  - Temp 36.6 ??C (97.9 ??F) (Temporal)  - Ht 162.6 cm (5' 4) Comment: pt reported - Wt 75.8 kg (167 lb) Comment: pt reported - LMP 07/20/2017 (Exact Date)  - BMI 28.67 kg/m??     Physical Exam  General: well-appearing, sitting upright in no acute distress  Head: Normocephalic, atraumatic  ENT: No dental trauma noted. Bilateral ears with cerumen impaction; once irrigated, some irritation and scant bleeding noted in external auditory canals bilaterally, with normal TM.   Eyes: conjunctiva normal, non-erythematous, non-icteric, no discharge.  Neck: no thyroid enlargement or masses  Lungs: No increased work of breathing or audible wheezing  Skin: Warm, dry, no erythema or rash on exposed skin  Musculoskeletal: No visible gait abnormalities  Neurologic: Alert & oriented x 3, no gross sensorimotor abnormalities  Psychiatric: Pleasant, cooperative, good eye contact, appropriate thought processes    Wt Readings from Last 3 Encounters:   11/17/22 75.8 kg (167 lb)   11/17/22 76.1 kg (167 lb 11.2 oz)   10/17/22 75.1 kg (165 lb 8 oz)      PHQ-9 PHQ-9 TOTAL SCORE   10/22/2020   9:00 AM 6   09/04/2020   9:00 AM 3   08/21/2020   9:00 AM 5   05/22/2020   3:00 PM 9     LABS/IMAGING  I have reviewed pertinent recent labs and imaging in Epic    Mentor,  MD, MPH (he/him)  Resident Physician, PGY-3  Department of Family Medicine        [facility]  [place] [facility] [place] [facility]  [address], [place], [place] [address]  Telephone [phone]  Fax [phone]  [email or web address]

## 2022-11-18 LAB — SYPHILIS SCREEN: SYPHILIS RPR SCREEN: NONREACTIVE

## 2022-11-18 LAB — HIV ANTIGEN/ANTIBODY COMBO: HIV ANTIGEN/ANTIBODY COMBO: NONREACTIVE

## 2022-11-18 NOTE — Unmapped (Signed)
[facility] AT [facility] [place]  OUTPATIENT PHYSICAL THERAPY  11/17/2022  Note Type: Treatment Note       Patient Name: Vincent Waters  Date of Birth:[birth year]  Diagnosis:   Encounter Diagnoses   Name Primary?    Neck pain on right side Yes    Chronic right shoulder pain     Shoulder stiffness, right     Thoracic outlet syndrome        Referring MD:  Dawna Part, *     Date of Onset of Impairment-09/18/2016  Date PT Care Plan Established or Reviewed-11/07/2022  Date PT Treatment Started-05/06/2022   Plan of Care Effective Date:          Assessment/Plan:    Assessment  Assessment details:    Started with manual to release Vincent Waters's shoulder musculature.  Immediately afterwards, he stated he felt better (I can move my neck, now!).  I do think this is a symptom of the underlying cause, which is a musculature imbalance.  Started to address this by working on depression and retraction strength.  Recommended that he avoid push-ups, which will contribute to the issue and he agreed to avoid this.  Educated him on strengthening exercises and advised him to do them 3x/week.  Written instructions given for these exercises.  He committed to performing them consistently as part of his home exercise program.  All questions answered.  This patient requires skilled physical therapy services to address the outlined impairments in order to return to his desired level of function.           Impairments: decreased strength, hypertonicity, pain and postural weakness                      Therapy Goals      Goals:      Short Term Goals: Updated 11/07/2022.   In 6 weeks:  Goal #1: Patient will be independent with progressively monitored home exercise program.   Baseline/Current: Patient is familiar with home exercise program and performs it consistently.     Goal #2: Patient will demonstrate cervical ROM within normal limits with minimal symptoms in order to allow for functional activities such as safe driving and looking before crossing the street.   Baseline/Current: COMPLETE.  Patient now has normal cervical ROM (see ROM testing).     Goal #3: Patient will demonstrate right shoulder range of motion within normal limits with minimal symptoms for proper glenohumeral joint mechanics in order to allow for daily functional tasks.   Baseline/Current: COMPLETE.  Patient has normal right shoulder range of motion (see ROM testing).     Goal #4: Patient will demonstrate improved posture without cuing.  Baseline/Current: PARTIALLY COMPLETE.  Patient has intermittently improved posture with right shoulder still sitting forward and ups.    Long Term Goals: Updated 11/07/2022.   In 12 weeks:  Goal #1: Patient will demonstrate right shoulder strength within normal limits with minimal symptoms in order to allow for pain free lifting and reaching with right upper extremity.   Baseline/Current: NEARLY COMPLETE.  Patient has significantly improved right shoulder strength with residual weakness (see manual muscle testing).     Goal #2: Patient will be able to reach over head with minimal symptoms in order to place/remove an object over shoulder height in a cabinet.  Baseline/Current: COMPLETE.  Patient can now reach over head without issue.    Goal #3: Patient will return to recreational activities (working out in  gym) without limit.  Baseline/Current: PARTIALLY COMPLETE.  Patient has returned to limited weight lifting.    Goal #4: Patient's FOTO score will improve from 34 at initial evaluation to >/= 52 at discharge in order to demonstrate a significant improvement in self-reported outcome measures that directly result from his course of physical therapy.   Baseline/Current: IN PROGRESS.  FOTO = 34    Plan    Therapy options: will be seen for skilled physical therapy services    Planned therapy interventions: Body Mechanics Training, Dry Needling, Education - Patient, Home Exercise Program, Manual Therapy, Neuromuscular Re-education, Postural Training, Therapeutic Activities and Therapeutic Exercises      Frequency: 1x week    Duration in weeks: 6    Education provided to: patient.    Education provided: HEP, Estate manager/land agent, Treatment options and plan, Symptom management and Body awareness    Education results: verbalized good understanding, demonstrates understanding and needs further instruction.    Communication/Consultation: N/A.    Next visit plan:        Posterior shoulder/depressor strengthening.    Total Session Time: 45    Treatment rendered today:      Therapeutic Exercise: 30 minutes  Patient Education: Plan of Care and Home Exercise Program With Handout.   Exercises:  - Seated Bilateral Horizontal Abduction With Band: 20 x (L5)  - Seated Bilateral ER With Band: 20 x (L5)  - Seated Scapular Depression: 4 x [5 x 5]  - Prone Scapular Adduction: 10 x 3  - Prone Horizontal Abduction c Scapular Adduction: 20 x  - Prone Scaption With Scapular Upward Rotation: 20 x    Manual Therapy: 15 minutes  - Pin and Stretch right upper trapezius and right levator scapulae  Intramuscular Therapy: 5 minutes  Vincent Waters [facility]  was informed of the risks associated with dry needling and he signed consent form.  Patient screened for any contraindications to needling including pregnancy, infection, antibiotic use, anticoagulants use, autoimmune issues, uncontrolled diabetes, and active cancer/treatment and does not have any of these conditions.  DRY NEEDLING FOR RELEASING IDENTIFIED TRIGGER POINTS TO FACILITATE MANUAL THERAPY TECHNIQUES AND ALLOW MUSCLE TO RETURN TO OPTIMAL LENGTH (NO CHARGE).  Right Upper Trapezius trigger point with 50mm x .30mm with Pinch/Pull Technique established for safety - Local Twitch Response produced     Right Levator Scapulae trigger point with 50mm x .30mm with Pinch/Pull Technique established for safety - Local Twitch Response produced           Plan details: Physiotec ID: [id number]       Subjective:   History of Present Condition  Date of Surgery: 11/16/2021  Surgical Procedure: Other (Right anterior scalenectomy, middle scalenectomy, first rib resection and right Pectoralis minor release)  History of Present Condition/Chief Complaint:       Mr. Boedeker is a 42 y.o. right hand dominant adult who presents to outpatient physical therapy with right neck and shoulder pain that began in early 2018 when he was changing the oil in his car when it fell onto his sternum.  He went to the ER and got an x-ray which showed a sternal fracture.  Since then, he has had persistent issues and nerve pressure in his right neck/shoulder which led him to get a right anterior scalenectomy, middle scalenectomy, first rib resection and right Pectoralis minor release surgery performed on 11/16/2021 by Thora Lance, MD.  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  Date of Onset:  09/18/2016  Date is an approximation?: yes  Explanation:  Patient reports onset of symptoms in early 2018.     Subjective:     Mr. Kemna reports that he saw Dr. Gayla Doss, who made some recommendations on how to move forward.    Pain  Current pain rating: 0  Location: right neck/shoulder  Quality: aching and other (annoying, irritating)  Progression: improved                  Objective:       Palpation     Left Shoulder    Hypertonic in the levator scapulae and upper trapezius.     Left Cervical/Thoracic   Hypertonic in the levator scapulae and upper trapezius.                                   I attest that I have reviewed the above information.  Signed: Patton Salles, PT  11/17/2022 5:59 PM

## 2022-11-19 MED ORDER — DOXYCYCLINE HYCLATE 100 MG TABLET
ORAL_TABLET | 0 refills | 0 days | Status: CP
Start: 2022-11-19 — End: ?
  Filled 2022-11-23: qty 40, 20d supply, fill #0

## 2022-11-21 DIAGNOSIS — F902 Attention-deficit hyperactivity disorder, combined type: Principal | ICD-10-CM

## 2022-11-21 DIAGNOSIS — F1320 Sedative, hypnotic or anxiolytic dependence, uncomplicated: Principal | ICD-10-CM

## 2022-11-21 LAB — ANA
ANA TITER 1: >=1:640 {titer}
ANTINUCLEAR ANTIBODIES (ANA): POSITIVE — AB

## 2022-11-21 MED ORDER — CLONAZEPAM 0.5 MG TABLET
ORAL_TABLET | 0 refills | 0.00000 days
Start: 2022-11-21 — End: ?

## 2022-11-21 MED ORDER — DEXTROAMPHETAMINE-AMPHETAMINE 20 MG TABLET
ORAL_TABLET | Freq: Every day | ORAL | 0 refills | 10.00000 days | PRN
Start: 2022-11-21 — End: 2022-12-21

## 2022-11-22 NOTE — Unmapped (Signed)
Patient calling back to request medications for bacterial infection that was previously discussed with Dr. Arlyce Dice.

## 2022-11-24 MED ORDER — METRONIDAZOLE 500 MG TABLET
ORAL_TABLET | Freq: Two times a day (BID) | ORAL | 0 refills | 7 days | Status: CP
Start: 2022-11-24 — End: 2022-12-01
  Filled 2022-11-28: qty 14, 7d supply, fill #0

## 2022-11-24 NOTE — Unmapped (Signed)
Pt called to follow up regarding medication

## 2022-11-25 ENCOUNTER — Ambulatory Visit: Admit: 2022-11-25 | Discharge: 2022-11-26 | Payer: PRIVATE HEALTH INSURANCE

## 2022-11-25 DIAGNOSIS — F902 Attention-deficit hyperactivity disorder, combined type: Principal | ICD-10-CM

## 2022-11-25 DIAGNOSIS — F1320 Sedative, hypnotic or anxiolytic dependence, uncomplicated: Principal | ICD-10-CM

## 2022-11-25 DIAGNOSIS — J0101 Acute recurrent maxillary sinusitis: Principal | ICD-10-CM

## 2022-11-25 MED ORDER — DOXYCYCLINE 50 MG/5 ML ORAL SYRUP
Freq: Every day | ORAL | 0 refills | 10 days | Status: CP
Start: 2022-11-25 — End: 2022-11-25

## 2022-11-25 MED ORDER — DOXYCYCLINE HYCLATE 100 MG TABLET
ORAL_TABLET | Freq: Two times a day (BID) | ORAL | 0 refills | 10 days | Status: CP
Start: 2022-11-25 — End: 2022-12-05

## 2022-11-25 NOTE — Unmapped (Signed)
Sending in flagyl rx for positive BV w/ symptoms, per mychart conversation with pt.    Alver Fisher, MD, MPH (he/him)  Resident Physician, PGY-3  Department of [facility]

## 2022-11-25 NOTE — Unmapped (Signed)
Assessment/Plan:     Vincent Waters was seen today for facial pain.    Diagnoses and all orders for this visit:    Acute recurrent maxillary sinusitis  -     Discontinue: doxycycline (VIBRAMYCIN) 50 mg/5 mL Syrp; Take 10 mL (100 mg total) by mouth daily for 10 days.  -     doxycycline (VIBRA-TABS) 100 MG tablet; Take 1 tablet (100 mg total) by mouth two (2) times a day for 10 days.      DDx  URI  Allergic Rhinitis  Sinusitis viral vs bacterial  Viral syndrome    Patient meets IDSA criteria to treat for bacterial sinusitis   - persistent symptoms not improving > 10 days  - fever > 39C and new U/L facial pain, purulent discharge > 3-4 days  - worsening symptoms (fever, headache, nor increase in discharge) after typical URI lasting 5-6 days    Recommend supportive care with saline irrigation, intranasal corticosteroids, and analgesia as needed.     Return precautions were expressed to patient. If anything should worsen i.e. ( fevers, chills, vertigo, mastoid bone tenderness, erythema, nausea, vomiting, stiff neck, vertigo, trismus, drooling, or photophobia  ) she should go to ER immediately. Otherwise follow up with primary care physician. Diagnosis/possible diagnoses and treatment plan were gone over with patient and all questions were answered to patient's satisfaction. Pt happy and in agreement with plan.     This note was narrated with the use of dragon software. It was proof read by me, but may contain transcription errors. This does not reflect the level of care that was administered by our team today.         No results found for this visit on 11/25/22.  No results found.          Did today's visit save an ED/Direct Admission?  yes  Follow-up as Needed     PHQ-2 Score:  PHQ-2 Total Score : 0  PHQ-9 Score:       Screening complete, no depression identified / no further action needed today           Subjective   Chief concern/reason for visit: Facial Pain (Sinus pressure and pain, ear pain, headache for about 2 weeks. )      HPI:  42 y.o. adult with a pmhx of   Past Medical History:   Diagnosis Date    Anxiety     Asthma     Autoimmune autonomic neuropathy     Breast cyst     Financial difficulties     Lack of access to transportation     Migraines     Neuromuscular disorder (CMS-HCC)     Scleroderma (CMS-HCC)     Visual impairment      presents to [facility] for [facility] visit for concerns of recurrent sinus infection. Reports had an apt with ENT on Monday but it got cancelled. It is associated with 10 days of ear ,sinus, throat pain. Denies new fever, pain with EOM, stiff neck, vomiting. Has been using OTC medications without relief.     Patient is a non smoker  past surgical history sinus surge\ry  No pertinent past family medical history     Review of Systems   Constitutional:  Negative for appetite change, chills, fatigue, fever and unexpected weight change.   HENT:  Positive for congestion, sinus pressure and sinus pain. Negative for drooling, ear discharge, ear pain, mouth sores, postnasal drip, rhinorrhea, sneezing, sore throat and trouble swallowing.  Eyes:  Negative for discharge and redness.   Respiratory:  Negative for apnea, cough, chest tightness, shortness of breath, wheezing and stridor.    Cardiovascular:  Negative for chest pain and leg swelling.   Gastrointestinal:  Negative for abdominal pain, diarrhea, nausea and vomiting.   Musculoskeletal:  Negative for arthralgias, myalgias and neck stiffness.   Skin:  Negative for pallor and rash.   Allergic/Immunologic: Negative for immunocompromised state.   Neurological:  Negative for dizziness, light-headedness and headaches.   Hematological:  Negative for adenopathy.        Objective   BP 131/87 (BP Site: L Arm, BP Position: Sitting)  - Pulse 83  - Temp 36.9 ??C (98.4 ??F) (Oral)  - Wt 75.3 kg (166 lb)  - LMP 07/20/2017 (Exact Date)  - SpO2 99%  - BMI 28.49 kg/m??       Physical Exam  Vitals and nursing note reviewed.   Constitutional:       General: He is not in acute distress.     Appearance: He is well-developed. He is not diaphoretic.   HENT:      Head: Normocephalic and atraumatic.      Jaw: No trismus.      Right Ear: Hearing, tympanic membrane, ear canal and external ear normal. No drainage. No middle ear effusion. No mastoid tenderness. Tympanic membrane is not injected, erythematous or bulging.      Left Ear: Hearing, tympanic membrane, ear canal and external ear normal. No drainage.  No middle ear effusion. No mastoid tenderness. Tympanic membrane is not injected, erythematous or bulging.      Nose: Congestion present. No mucosal edema or rhinorrhea.      Right Sinus: Maxillary sinus tenderness present. No frontal sinus tenderness.      Left Sinus: Maxillary sinus tenderness present. No frontal sinus tenderness.      Mouth/Throat:      Mouth: No oral lesions.      Dentition: Normal dentition. No dental caries or dental abscesses.      Pharynx: Uvula midline. No oropharyngeal exudate, posterior oropharyngeal erythema or uvula swelling.      Tonsils: No tonsillar abscesses.   Eyes:      General: No scleral icterus.        Right eye: No discharge.         Left eye: No discharge.      Conjunctiva/sclera: Conjunctivae normal.      Right eye: Right conjunctiva is not injected.      Left eye: Left conjunctiva is not injected.      Pupils: Pupils are equal, round, and reactive to light.   Neck:      Trachea: Phonation normal.   Cardiovascular:      Rate and Rhythm: Normal rate and regular rhythm.      Heart sounds: Normal heart sounds.      Comments: No edema noted bilateral distal extremities   Pulmonary:      Effort: Pulmonary effort is normal. No tachypnea, accessory muscle usage or respiratory distress.      Breath sounds: Normal breath sounds. No stridor. No wheezing, rhonchi or rales.   Chest:      Chest wall: No tenderness.   Musculoskeletal:      Cervical back: Neck supple.   Lymphadenopathy:      Cervical: No cervical adenopathy.   Skin:     General: Skin is warm and dry.      Coloration: Skin is not pale.  Findings: No erythema or rash.      Nails: There is no clubbing.   Neurological:      Mental Status: He is alert and oriented to person, place, and time.      GCS: GCS eye subscore is 4. GCS verbal subscore is 5. GCS motor subscore is 6.   Psychiatric:         Behavior: Behavior normal.         Judgment: Judgment normal.              This note was narrated with the use of dragon software. It was proof read by me, but may contain transcription errors. This does not reflect the level of care that was administered by our team today.     Lorenda Ishihara Facundo Allemand, PA

## 2022-11-25 NOTE — Unmapped (Signed)
You have been prescribed an antibiotic called doxycyline.   Although very effective we advise you do the following while taking this medication:  > Take with food to avoid stomach upset  > Avoid going out in the sun as it can make you sensitive to sunlight and cause a rash. If you must go in the sun where long protective clothing and hats in addition to SPF 50+  > Do not take within 6-8 hours of vitamins (especially iron) as it can interfere with the absorption  > Take a probiotic 1-2 hours AFTER taking antibiotic to help protect gut from side effects/flora disruption

## 2022-11-25 NOTE — Unmapped (Signed)
Message sent to provider. Waiting for provider approval.

## 2022-11-25 NOTE — Unmapped (Signed)
Addended by: Marylyn Ishihara on: 11/24/2022 08:57 PM     Modules accepted: Orders

## 2022-11-25 NOTE — Unmapped (Signed)
[facility] [place]  OUTPATIENT PHYSICAL THERAPY  11/25/2022  Note Type: Treatment Note       Patient Name: Vincent Waters  Date of Birth:[birth year]  Diagnosis:   Encounter Diagnoses   Name Primary?    Neck pain on right side Yes    Chronic right shoulder pain     Shoulder stiffness, right     Thoracic outlet syndrome        Referring MD:  Dawna Part, *     Date of Onset of Impairment-09/18/2016  Date PT Care Plan Established or Reviewed-11/07/2022  Date PT Treatment Started-05/06/2022   Plan of Care Effective Date:          Assessment/Plan:    Assessment  Assessment details:    We are striking a good balance between addressing Vincent Waters's upper trapezius tightness (symptom) and his weakness/muscle imbalance (cause).  He is doing a nice job with his Home Exercise Program and I do think that if we're consistent with his strengthening, his symptoms will eventually minimize, if not alleviate.  He agrees with this plan. Encouraged him to be consistent with his exercises and he agreed to do this.  He needs quite a bit of visual and verbal cuing for correct exercise performance, as he tends to elevate his right shoulder and flex his elbow to over activate his upper trapezius, chest and biceps musculature  All questions answered.  This patient requires skilled physical therapy services to address the outlined impairments in order to return to his desired level of function.           Impairments: decreased strength, hypertonicity, pain and postural weakness                      Therapy Goals      Goals:      Short Term Goals: Updated 11/07/2022.   In 6 weeks:  Goal #1: Patient will be independent with progressively monitored home exercise program.   Baseline/Current: Patient is familiar with home exercise program and performs it consistently.     Goal #2: Patient will demonstrate cervical ROM within normal limits with minimal symptoms in order to allow for functional activities such as safe driving and looking before crossing the street.   Baseline/Current: COMPLETE.  Patient now has normal cervical ROM (see ROM testing).     Goal #3: Patient will demonstrate right shoulder range of motion within normal limits with minimal symptoms for proper glenohumeral joint mechanics in order to allow for daily functional tasks.   Baseline/Current: COMPLETE.  Patient has normal right shoulder range of motion (see ROM testing).     Goal #4: Patient will demonstrate improved posture without cuing.  Baseline/Current: PARTIALLY COMPLETE.  Patient has intermittently improved posture with right shoulder still sitting forward and ups.    Long Term Goals: Updated 11/07/2022.   In 12 weeks:  Goal #1: Patient will demonstrate right shoulder strength within normal limits with minimal symptoms in order to allow for pain free lifting and reaching with right upper extremity.   Baseline/Current: NEARLY COMPLETE.  Patient has significantly improved right shoulder strength with residual weakness (see manual muscle testing).     Goal #2: Patient will be able to reach over head with minimal symptoms in order to place/remove an object over shoulder height in a cabinet.  Baseline/Current: COMPLETE.  Patient can now reach over head without issue.    Goal #3: Patient will return to recreational activities (  working out in gym) without limit.  Baseline/Current: PARTIALLY COMPLETE.  Patient has returned to limited weight lifting.    Goal #4: Patient's FOTO score will improve from 34 at initial evaluation to >/= 52 at discharge in order to demonstrate a significant improvement in self-reported outcome measures that directly result from his course of physical therapy.   Baseline/Current: IN PROGRESS.  FOTO = 34    Plan    Therapy options: will be seen for skilled physical therapy services    Planned therapy interventions: Body Mechanics Training, Dry Needling, Education - Patient, Home Exercise Program, Manual Therapy, Neuromuscular Re-education, Postural Training, Therapeutic Activities and Therapeutic Exercises      Frequency: 1x week    Duration in weeks: 6    Education provided to: patient.    Education provided: HEP, Estate manager/land agent, Treatment options and plan, Symptom management and Body awareness    Education results: verbalized good understanding, demonstrates understanding and needs further instruction.    Communication/Consultation: N/A.    Next visit plan:        Posterior shoulder/depressor strengthening.    Total Session Time: 45    Treatment rendered today:      Therapeutic Exercise: 35 minutes  Patient Education: Plan of Care and Home Exercise Program With Handout.   Exercises:  - Seated Upper Trapezius Stretch: 3 x 30 (right only)  - Seated Levator Scapulae Stretch: 3 x 30 (right only)  - Seated Bilateral Horizontal Abduction With Band @ Large Foam Roll: 2 x 12 (L5)  - Seated Bilateral ER With Band @ Large Foam Roll: 2 x 12 (L5)  - Seated Swimmers With Band @ Large Foam Roll: 2 x 12 (L5)    Manual Therapy: 10 minutes  - Pin and Stretch right upper trapezius and right levator scapulae  Intramuscular Therapy: 5 minutes  Vincent Waters [facility]  was informed of the risks associated with dry needling and he signed consent form.  Patient screened for any contraindications to needling including pregnancy, infection, antibiotic use, anticoagulants use, autoimmune issues, uncontrolled diabetes, and active cancer/treatment and does not have any of these conditions.  DRY NEEDLING FOR RELEASING IDENTIFIED TRIGGER POINTS TO FACILITATE MANUAL THERAPY TECHNIQUES AND ALLOW MUSCLE TO RETURN TO OPTIMAL LENGTH (NO CHARGE).  Right Upper Trapezius trigger point with 50mm x .30mm with Pinch/Pull Technique established for safety - Local Twitch Response produced     Right Levator Scapulae trigger point with 50mm x .30mm with Pinch/Pull Technique established for safety - Local Twitch Response produced           Plan details: Physiot[employer] ID: [id number] Subjective:   History of Present Condition  Date of Surgery: 11/16/2021  Surgical Procedure: Other (Right anterior scalenectomy, middle scalenectomy, first rib resection and right Pectoralis minor release)  History of Present Condition/Chief Complaint:       Mr. Faure is a 42 y.o. right hand dominant adult who presents to outpatient physical therapy with right neck and shoulder pain that began in early 2018 when he was changing the oil in his car when it fell onto his sternum.  He went to the ER and got an x-ray which showed a sternal fracture.  Since then, he has had persistent issues and nerve pressure in his right neck/shoulder which led him to get a right anterior scalenectomy, middle scalenectomy, first rib resection and right Pectoralis minor release surgery performed on 11/16/2021 by Thora Lance, MD.  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    Date of Onset:  09/18/2016  Date is an approximation?: yes  Explanation:  Patient reports onset of symptoms in early 2018.     Subjective:     Mr. Cutbirth reports that he felt pretty good after last appointment.  He has been doing his exercises consistently..    Pain  Current pain rating: 0  Location: right neck/shoulder  Quality: aching and other (annoying, irritating)  Progression: improved                  Objective:     Static Posture Assessment   Static Posture Comments-Right shoulder elevated and forward.    Palpation     Left Shoulder    Hypertonic in the levator scapulae and upper trapezius.     Left Cervical/Thoracic   Hypertonic in the levator scapulae and upper trapezius.                                   I attest that I have reviewed the above information.  Signed: Patton Salles, PT  11/25/2022 7:56 AM

## 2022-11-25 NOTE — Unmapped (Unsigned)
Otolaryngology - Otology/Neurotology New Visit    HPI:  Vincent Waters is a 42 y.o. adult is seen in consultation at the request of Dr. Marilu Favre for evaluation of bilateral otalgia. No issues with hearing. On exma, bilateral EAC with impacted cerumen, this was irrigated with resultant bleeding in EAC.     --pain/pressure, unilateral vs. bilateral? onset? duration? aggravating factor? alleviating?   --onset? duration of pain?  --pain/Drainage/infection?  --Hearing: Aural fullness or decrease hearing?   --Allergies? Nasal congestion? PND? Rhinorrhea? Take any Flonase/Claritin/nasal rinses?   --TMJ: clench/grind teeth? jaw pain? facial pain? ever tried Tylenol/ibuprofen?  --Trauma/surgery to ear?   --[employer] (if refuse audio)  **Look at tonsils (to r/o tonsils cancer))     PMH:  Past Medical History:   Diagnosis Date    Anxiety     Asthma     Autoimmune autonomic neuropathy     Breast cyst     Financial difficulties     Lack of access to transportation     Migraines     Neuromuscular disorder (CMS-HCC)     Scleroderma (CMS-HCC)     Visual impairment         PSH:  Past Surgical History:   Procedure Laterality Date    CHOLECYSTECTOMY  2012    HYSTERECTOMY Bilateral     08/2017    MASTECTOMY      04/2018    OOPHORECTOMY  2005    laparotomy    PR CYSTOURETHROSCOPY N/A 08/30/2017    Procedure: CYSTOURETHROSCOPY (SEPARATE PROCEDURE);  Surgeon: Forbes Cellar, MD;  Location: [facility];  Service: Advanced Laparoscopy    PR EXCISION TURBINATE,SUBMUCOUS Bilateral 01/04/2021    Procedure: SUBMUCOUS RESECTION INFERIOR TURBINATE, PARTIAL OR COMPLETE, ANY METHOD;  Surgeon: Adam Swaziland Kimple, MD;  Location: [facility] OR [facility];  Service: ENT    PR INCISE TENDON/MUSCLE,SHLDR,SINGLE Right 11/16/2021    Procedure: RIGHT PEC MINOR RELEASE;  Surgeon: Thora Lance, MD;  Location: MAIN OR [place];  Service: Vascular    PR LAPAROSCOPY W TOT HYSTERECTUTERUS <=250 GRAM  W TUBE/OVARY N/A 08/30/2017    Procedure: LAPAROSCOPY, SURGICAL, WITH TOTAL HYSTERECTOMY, FOR UTERUS 250 G OR LESS; W/REMOVAL TUBE(S) AND/OR OVARY(S);  Surgeon: Forbes Cellar, MD;  Location: [facility] OR [facility]  Service: Advanced Laparoscopy    PR MASTECTOMY, SIMPLE, COMPLETE Bilateral 01/09/2020    Procedure: MASTECTOMY, SIMPLE, COMPLETE;  Surgeon: Carola Rhine, MD;  Location: MAIN OR [place];  Service: Plastics    PR NEUROPLASTY BRACHIAL PLEXUS,OPEN Right 11/16/2021    Procedure: RIGHT TOS DECOMPRESSION;  Surgeon: Thora Lance, MD;  Location: MAIN OR [place];  Service: Vascular    PR REPAIR OF NASAL SEPTUM N/A 01/04/2021    Procedure: SEPTOPLASTY OR SUBMUCOUS RESECTION, WITH OR WITHOUT CARTILAGE SCORING, CONTOURING OR REPLACEMENT WITH GRAFT;  Surgeon: Adam Swaziland Kimple, MD;  [facility]: [facility] OR [facility];  Service: ENT    PR UPPER GI ENDOSCOPY,BIOPSY N/A 08/13/2019    Procedure: UGI ENDOSCOPY; WITH BIOPSY, SINGLE OR MULTIPLE;  Surgeon: Alfred Levins, MD;  Location: [facility];  Service: Gastroenterology    SINUS SURGERY          Meds:  Current Outpatient Medications on File Prior to Visit   Medication Sig Dispense Refill    albuterol 2.5 mg /3 mL (0.083 %) nebulizer solution Inhale 3 mL (2.5 mg total) by nebulization every six (6) hours as needed for wheezing or shortness of breath. 90 mL 1  azelastine (ASTELIN) 137 mcg (0.1 %) nasal spray Instill 2 sprays into each nostril two (2) times a day as needed for rhinitis. 30 mL 1    budesonide-formoterol (SYMBICORT) 160-4.5 mcg/actuation inhaler Inhale 1 puff by mouth Two (2) times a day. 10.2 g 5    cetirizine (ZYRTEC) 10 MG tablet Take 1 tablet (10 mg total) by mouth daily. 30 tablet 12    clindamycin (CLEOCIN T) 1 % lotion 1 Application as needed.      clonazePAM (KLONOPIN) 0.5 MG tablet Take half tablet (0.25 mg total) by mouth once daily as needed for anxiety. For the next two weeks, every other day, may take an additional half a tablet as needed. After two weeks, decrease to taking half a tablet once daily as needed, without the additional dose 30 tablet 0    doxycycline (VIBRA-TABS) 100 MG tablet Take 200 mg (2 tablets) within 24-72 hours of any condomless sexual encounter. 40 tablet 0    emtricitabine-tenofovir alafen (DESCOVY) 200-25 mg tablet Take 1 tablet by mouth daily. 90 tablet 0    EPINEPHrine (EPIPEN) 0.3 mg/0.3 mL injection Inject 0.3 mL (0.3 mg total) under the skin once for 1 dose. 2 each 0    famotidine (PEPCID) 40 MG tablet Take 1 tablet (40 mg total) by mouth every evening. 30 tablet 1    gabapentin (NEURONTIN) 250 mg/5 mL oral solution Take 12 mL (600 mg total) by mouth Three (3) times a day. 1080 mL 2    gabapentin (NEURONTIN) 250 mg/5 mL oral solution Take 12 mL (600 mg total) by mouth Three (3) times a day. 1080 mL 1    ibuprofen (MOTRIN) 800 MG tablet Take 1 tablet (800 mg total) by mouth every eight (8) hours as needed for pain. 60 tablet 2    inhalational spacing device Spcr Use as directed with albuterol and symbicort 1 each 1    nebulizer and compressor (COMP-AIR NEBULIZER COMPRESSOR) Devi 1 each by Miscellaneous route nightly as needed. 1 each 0    needle, disp, 25 Ean (BD REGULAR BEVEL NEEDLES) 25 Jayr x 5/8 Ndle For subcutaneous hormone injection. 25 each 0    nifedipine 0.3% lidocaine 1.5% in petrolatum ointment Apply a pea-size amount to anal area 3 times a day 100 g 0    omalizumab (XOLAIR) 150 mg/mL syringe Inject 2 mL (300 mg total) under the skin every twenty-eight (28) days. 2 mL 12    pantoprazole (PROTONIX) 20 MG tablet Take 1 tablet (20 mg total) by mouth daily. 30 tablet 1    prucalopride 2 mg Tab Take 1 tablet (2 mg total) by mouth daily. 90 tablet 0    safety needles (BD ECLIPSE) 25 Garison x 1 Ndle use for testosterone 12 each 0    safety needles (BD SAFETYGLIDE NEEDLE) 18 Giancarlos x 1 1/2 Ndle For drawing hormone injection 25 each 0    syringe, disposable, (EASY TOUCH LUER LOCK SYRINGE) 1 mL Syrg Use for weekly hormone injection. 4 each 0    syringe, disposable, (EASY TOUCH LUER LOCK SYRINGE) 1 mL Syrg Use for testosterone 12 each 0    syringe, disposable, 2.5 mL Syrg Use weekly 30 Syringe 2    testosterone cypionate (DEPOTESTOTERONE CYPIONATE) 200 mg/mL injection Inject 0.2 mL (40 mg total) under the skin every seven (7) days. 2 mL 0    triamcinolone (KENALOG) 0.1 % cream Apply topically Two (2) times a day. 15 g 0     Current Facility-Administered Medications on  File Prior to Visit   Medication Dose Route Frequency Provider Last Rate Last Admin    omalizumab [name]) injection 300 mg  300 mg Subcutaneous Q28 Days [name], [name], [name]   300 mg at 05/24/22 1138       Allergies:  Allergies   Allergen Reactions    Latex Rash and Hives    Penicillins Anaphylaxis     Pt can tolerate amoxicillin   Has patient had a PCN reaction causing immediate rash, facial/tongue/throat swelling, SOB or lightheadedness with hypotension: no   Has patient had a PCN reaction causing severe rash involving mucus membranes or skin necrosis: no  Has patient had a PCN reaction that required hospitalization: unknown  Has patient had a PCN reaction occurring within the last 10 years: no  If all of the above answers are NO, then may proceed with Cephalosporin use.    Shellfish Containing Products Anaphylaxis    Other Rash     Nifedipine 0.3%, Lidocaine 1.5% in petrolatum ointment caused rash on his Bottom    Latex, Natural Rubber Rash    Penicillin Rash        FH:  Family History   Problem Relation Age of Onset    Cancer Mother         breast    Thyroid disease Mother     Hypertension Father     Breast cancer Sister     Cancer Sister         breast    Ovarian cancer Maternal Grandmother     Breast cancer Maternal Grandmother     Cataracts Maternal Grandmother        SH:    Social History     Tobacco Use    Smoking status: Never    Smokeless tobacco: Never   Vaping Use    Vaping status: Never Used   Substance Use Topics    Alcohol use: Not Currently    Drug use: Yes Types: Marijuana     Comment: occasionaly        ROS:  Review of systems was reviewed on attached notes/patient intake forms.     Exam:  LMP 07/20/2017 (Exact Date)     General: well appearing, stated age, no distress   Head - atraumatic, normocephalic   Nose: dorsum midline, no rhinorrhea  Neck: symmetric, trachea midline  Psychiatric: alert and oriented, appropriate mood and affect   Respiratory: no audible wheezing or stridor, normal work of breathing  Neurologic - cranial nerves 2-12 grossly intact  Facial Strength - HB 1/6 bilaterally    Ears - External ear- normal, no lesions, no malformations   Otoscopy - ***  Tuning fork test - Weber ***    Audiogram: The audiogram was personally reviewed and interpreted. This demonstrates ***    Tympanogram: The tympanogram was personally reviewed and interpreted. This demonstrates ***    Imaging: ***    I personally reviewed outside records.    Assessment/Plan:  Vincent Waters is a 42 y.o. adult with a history of ***     The patient/family voiced understanding of the plan as detailed above and is in agreement. I appreciate the opportunity to participate in his care.    I attest to the above information and documentation. However, this note has been created using voice recognition software and may have errors that were not dictated and not seen in editing.

## 2022-11-25 NOTE — Unmapped (Signed)
Patient called and said that he needs a refill on clonazePAM (KLONOPIN) 0.5 MG tablet & dextroamphetamine-amphetamine (ADDERALL) 20 mg tablet     [facility]  [address], [place] [address]  Phone: [phone]  Fax: [phone]

## 2022-11-26 MED ORDER — CLONAZEPAM 0.5 MG TABLET
ORAL_TABLET | 0 refills | 0 days | Status: CP
Start: 2022-11-26 — End: ?
  Filled 2022-11-28: qty 30, 30d supply, fill #0

## 2022-11-26 MED ORDER — DEXTROAMPHETAMINE-AMPHETAMINE ER 20 MG 24HR CAPSULE,EXTEND RELEASE
ORAL_CAPSULE | Freq: Two times a day (BID) | ORAL | 0 refills | 5 days | Status: CP
Start: 2022-11-26 — End: ?
  Filled 2022-11-28: qty 10, 5d supply, fill #0

## 2022-12-01 DIAGNOSIS — K219 Gastro-esophageal reflux disease without esophagitis: Principal | ICD-10-CM

## 2022-12-01 MED ORDER — FAMOTIDINE 40 MG TABLET
ORAL_TABLET | Freq: Every evening | ORAL | 1 refills | 30 days
Start: 2022-12-01 — End: 2023-12-01

## 2022-12-01 NOTE — Unmapped (Signed)
[facility] AT [facility]  OUTPATIENT PHYSICAL THERAPY  12/01/2022  Note Type: Treatment Note       Patient Name: Vincent Waters  Date of Birth:[birth year]  Diagnosis:   Encounter Diagnoses   Name Primary?    Neck pain on right side Yes    Chronic right shoulder pain     Shoulder stiffness, right     Thoracic outlet syndrome        Referring MD:  Dawna Part, *     Date of Onset of Impairment-09/18/2016  Date PT Care Plan Established or Reviewed-11/07/2022  Date PT Treatment Started-05/06/2022   Plan of Care Effective Date:          Assessment/Plan:    Assessment  Assessment details:    After discussion, Vincent Waters is using too much weight for his exercises at home.  Reviewed his current exercises and agreed on the appropriate level of resistance he should be doing at home (1-2 lb for most of the exercises compared to the 4-8 lb he was doing).  He agreed to purchase a smaller set of dumbbells to help him do the appropriate weight at home.  All questions answered.  This patient requires skilled physical therapy services to address the outlined impairments in order to return to his desired level of function.           Impairments: decreased strength, hypertonicity, pain and postural weakness                      Therapy Goals      Goals:      Short Term Goals: Updated 11/07/2022.   In 6 weeks:  Goal #1: Patient will be independent with progressively monitored home exercise program.   Baseline/Current: Patient is familiar with home exercise program and performs it consistently.     Goal #2: Patient will demonstrate cervical ROM within normal limits with minimal symptoms in order to allow for functional activities such as safe driving and looking before crossing the street.   Baseline/Current: COMPLETE.  Patient now has normal cervical ROM (see ROM testing).     Goal #3: Patient will demonstrate right shoulder range of motion within normal limits with minimal symptoms for proper glenohumeral joint mechanics in order to allow for daily functional tasks.   Baseline/Current: COMPLETE.  Patient has normal right shoulder range of motion (see ROM testing).     Goal #4: Patient will demonstrate improved posture without cuing.  Baseline/Current: PARTIALLY COMPLETE.  Patient has intermittently improved posture with right shoulder still sitting forward and ups.    Long Term Goals: Updated 11/07/2022.   In 12 weeks:  Goal #1: Patient will demonstrate right shoulder strength within normal limits with minimal symptoms in order to allow for pain free lifting and reaching with right upper extremity.   Baseline/Current: NEARLY COMPLETE.  Patient has significantly improved right shoulder strength with residual weakness (see manual muscle testing).     Goal #2: Patient will be able to reach over head with minimal symptoms in order to place/remove an object over shoulder height in a cabinet.  Baseline/Current: COMPLETE.  Patient can now reach over head without issue.    Goal #3: Patient will return to recreational activities (working out in gym) without limit.  Baseline/Current: PARTIALLY COMPLETE.  Patient has returned to limited weight lifting.    Goal #4: Patient's FOTO score will improve from 34 at initial evaluation to >/= 52 at discharge in order  to demonstrate a significant improvement in self-reported outcome measures that directly result from his course of physical therapy.   Baseline/Current: IN PROGRESS.  FOTO = 34    Plan    Therapy options: will be seen for skilled physical therapy services    Planned therapy interventions: Body Mechanics Training, Dry Needling, Education - Patient, Home Exercise Program, Manual Therapy, Neuromuscular Re-education, Postural Training, Therapeutic Activities and Therapeutic Exercises      Frequency: 1x week    Duration in weeks: 6    Education provided to: patient.    Education provided: HEP, Estate manager/land agent, Treatment options and plan, Symptom management and Body awareness Education results: verbalized good understanding, demonstrates understanding and needs further instruction.    Communication/Consultation: N/A.    Next visit plan:        Posterior shoulder/depressor strengthening.    Total Session Time: 45    Treatment rendered today:      Therapeutic Exercise: 30 minutes  Patient Education: Plan of Care and Home Exercise Program.   Exercises:  - Seated Scapular Depression in Chair: 20 x 5  - Prone Horizontal Abduction c Scapular Adduction: 2 x 10, 1 lb    Manual Therapy: 15 minutes  - Pin and Stretch right upper trapezius and right levator scapulae  Intramuscular Therapy: 5 minutes  Vincent Waters  was informed of the risks associated with dry needling and he signed consent form.  Patient screened for any contraindications to needling including pregnancy, infection, antibiotic use, anticoagulants use, autoimmune issues, uncontrolled diabetes, and active cancer/treatment and does not have any of these conditions.  DRY NEEDLING FOR RELEASING IDENTIFIED TRIGGER POINTS TO FACILITATE MANUAL THERAPY TECHNIQUES AND ALLOW MUSCLE TO RETURN TO OPTIMAL LENGTH (NO CHARGE).  Right Upper Trapezius trigger point with 50mm x .30mm with Pinch/Pull Technique established for safety - Local Twitch Response produced     Right Levator Scapulae trigger point with 50mm x .30mm with Pinch/Pull Technique established for safety - Local Twitch Response produced           Plan details: Physiotec ID: [id number]       Subjective:     History of Present Condition     Date of surgery:  11/16/2021    Date of onset:  09/18/2016    Date is approximation?: yes    Explanation:  Patient reports onset of symptoms in early 2018.       Surgical Procedure:  Other (Right anterior scalenectomy, middle scalenectomy, first rib resection and right Pectoralis minor release)    History of Present Condition/Chief Complaint:  Vincent Waters is a 42 y.o. right hand dominant adult who presents to outpatient physical therapy with right neck and shoulder pain that began in early 2018 when he was changing the oil in his car when it fell onto his sternum.  He went to the ER and got an x-ray which showed a sternal fracture.  Since then, he has had persistent issues and nerve pressure in his right neck/shoulder which led him to get a right anterior scalenectomy, middle scalenectomy, first rib resection and right Pectoralis minor release surgery performed on 11/16/2021 by Thora Lance, MD.  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  Subjective:  Mr. Shadbolt reports that his shoulder was feeling good until a couple of days ago.   It was probably due to lifting.  He has been doing his exercises consistently..  Pain:     Current pain  rating:  2  Location:  Right neck/shoulder    Quality:  Tight    Progression:  Improved      Objective:     Static Posture Assessment   Static Posture Comments-Right shoulder elevated and forward.    Palpation     Left Shoulder    Hypertonic in the levator scapulae and upper trapezius.     Left Cervical/Thoracic   Hypertonic in the levator scapulae and upper trapezius.                                   I attest that I have reviewed the above information.  Signed: Patton Salles, PT  12/01/2022 1:02 PM

## 2022-12-06 DIAGNOSIS — J3089 Other allergic rhinitis: Principal | ICD-10-CM

## 2022-12-06 DIAGNOSIS — J309 Allergic rhinitis, unspecified: Principal | ICD-10-CM

## 2022-12-06 DIAGNOSIS — L508 Other urticaria: Principal | ICD-10-CM

## 2022-12-06 MED ORDER — XOLAIR 150 MG/ML SUBCUTANEOUS SYRINGE
SUBCUTANEOUS | 12 refills | 28 days | Status: CP
Start: 2022-12-06 — End: ?
  Filled 2022-12-08: qty 2, 28d supply, fill #0

## 2022-12-06 NOTE — Unmapped (Signed)
[facility] Refill Coordination Note    Vincent Waters, Vincent Waters: January 23, 1981  Phone: [phone] (home) [phone] (work)      All above HIPAA information was verified with patient.         12/05/2022    11:11 AM   Specialty Rx Medication Refill Questionnaire   Which Medications would you like refilled and shipped? Xolait   Please list all current allergies: Ltex oenicillin shellfish   Have you missed any doses in the last 30 days? No   Have you had any changes to your medication(s) since your last refill? No   How many days remaining of each medication do you have at home? 0   Have you experienced any side effects in the last 30 days? No   Please enter the full address (street address, city, state, zip code) where you would like your medication(s) to be delivered to. [address] [place] [address]   Please specify on which day you would like your medication(s) to arrive. Note: if you need your medication(s) within 3 days, please call the pharmacy to schedule your order at [phone]  12/08/2022   Has your insurance changed since your last refill? No   Would you like a pharmacist to call you to discuss your medication(s)? No   Do you require a signature for your package? (Note: if we are billing Medicare Part B or your order contains a controlled substance, we will require a signature) No         Completed refill call assessment today to schedule patient's medication shipment from the [facility] [phone]).  All relevant notes have been reviewed.       Confirmed patient received a Conservation officer, historic buildings and a Surveyor, mining with first shipment. The patient will receive a drug information handout for each medication shipped and additional FDA Medication Guides as required.         REFERRAL TO PHARMACIST     Referral to the pharmacist: Not needed      [facility]     Shipping address confirmed in Epic.     Delivery Scheduled: Yes, Expected medication delivery date: 12/08/22.  However, Rx request for refills was sent to the provider as there are none remaining.     Medication will be delivered via Same Day Courier to the prescription address in Epic WAM.    Vincent Brandvold' [name] Shared [facility] Specialty Technician

## 2022-12-07 ENCOUNTER — Ambulatory Visit
Admit: 2022-12-07 | Payer: PRIVATE HEALTH INSURANCE | Attending: Student in an Organized Health Care Education/Training Program | Primary: Student in an Organized Health Care Education/Training Program

## 2022-12-07 MED ORDER — FAMOTIDINE 40 MG TABLET
ORAL_TABLET | Freq: Every evening | ORAL | 1 refills | 30 days | Status: CP
Start: 2022-12-07 — End: 2023-12-07

## 2022-12-08 MED ORDER — DEXTROAMPHETAMINE-AMPHETAMINE 20 MG TABLET
ORAL_TABLET | Freq: Every day | ORAL | 0 refills | 0 days | Status: CP | PRN
Start: 2022-12-08 — End: ?
  Filled 2022-12-21: qty 10, 10d supply, fill #0

## 2022-12-08 NOTE — Unmapped (Signed)
Re-sending adderall 20 mg x10 as tablets, as I mistakenly sent these earlier as capsules.    Alver Fisher, MD, MPH (he/him)  Resident Physician, PGY-3  Department of [facility] Medicine

## 2022-12-08 NOTE — Unmapped (Signed)
Addended by: Marylyn Ishihara on: 12/08/2022 03:59 PM     Modules accepted: Orders

## 2022-12-09 DIAGNOSIS — G528 Disorders of other specified cranial nerves: Principal | ICD-10-CM

## 2022-12-09 NOTE — Unmapped (Signed)
After failing conservative measures for at least 4 weeks, through the process of shared decision making, we have decided to move forward with the below procedure.    Procedure ordered( Include Side and Specific Level) : Right spinal accessory nerve block and pulsed RFA under ultrasound guidance  Diagnosis: severe trapezius spasm/pain, spinal accessory neuralgia  Does the condition cause moderate to severe pain and/or impairment in function? (Pain scale of 1-3 is considered low and does NOT support ???moderate??? or ???severe??? pain level): Yes..  If pain scores low, does this pain impact function: N/A-pain scores in moderate/severe range, does impact function.  Had previously?: No.. Last: N/A  If yes, results (% relief, %functional improvement, and duration): NA  Did have a right spinal accessory nerve block with steroid 11/04/22-100% relief immediately, around 40% relief for several days  HEP (please include frequency and duration of sessions): Yes.   Last PT for this indication: 12/01/22  Anticoagulants: None.  Instructions?  N/A  Labs required?: No.  Driver: No.  PIV: No.  Risk category (consider age, hx bleeding issues, anticoagulants, liver dx, kidney dx): low  Expected timing: Next available  Length of procedure: 30 minutes  Pertinent physical exam findings: severe pain and spasm in right trapezius  Pertinent imaging findings: N/A  Contrast allergy: No., will contrast be used: No.  Medication to be injected: Local anesthetic  To be done under fluoroscopy: No.      Orders Placed This Encounter   Procedures    Nerve Inj Other Periph Nerve (57846)     Right spinal accessory nerve block and pulsed RFA under ultrasound  No driver  No PIV     Standing Status:   Future     Standing Expiration Date:   12/09/2023    Ultrasonic guidance for needle placement (96295)     Standing Status:   Future     Standing Expiration Date:   12/09/2023

## 2022-12-16 NOTE — Unmapped (Signed)
Medication Request     Patient Name: Vincent Waters   Caller: Self (Patient)  Have you contacted your pharmacy? yes      Last Visit: 11/17/2022       Medication Name: all medications besides gabapentin  Day Supply Requested: 30  Pharmacy (Name & Address): [facility] at [facility] Phone Number: [phone]     Patient has an appointment scheduled with Dr. Gailen Shelter in August.  States he will run out of his medications before then and is requesting refills on all his medications besides the gabapentin to get him through to his appointment.

## 2022-12-25 NOTE — Unmapped (Unsigned)
Otolaryngology - Otology/Neurotology New Visit    HPI:  Vincent Waters is a 42 y.o. adult is seen in consultation at the request of Dr. Marilu Favre for evaluation of bilateral otalgia. No issues with hearing. On exam, bilateral EAC with impacted cerumen, this was irrigated with resultant bleeding in EAC.     --pain/pressure, unilateral vs. bilateral? onset? duration? aggravating factor? alleviating?   --onset? duration of pain?  --pain/Drainage/infection?  --Hearing: Aural fullness or decrease hearing?   --Allergies? Nasal congestion? PND? Rhinorrhea? Take any Flonase/Claritin/nasal rinses?   --TMJ: clench/grind teeth? jaw pain? facial pain? ever tried Tylenol/ibuprofen?  --Trauma/surgery to ear?   --[employer] (if refuse audio)  **Look at tonsils (to r/o tonsils cancer))     PMH:  Past Medical History:   Diagnosis Date    Anxiety     Asthma     Autoimmune autonomic neuropathy     Breast cyst     Financial difficulties     Lack of access to transportation     Migraines     Neuromuscular disorder (CMS-HCC)     Scleroderma (CMS-HCC)     Visual impairment         PSH:  Past Surgical History:   Procedure Laterality Date    CHOLECYSTECTOMY  2012    HYSTERECTOMY Bilateral     08/2017    MASTECTOMY      04/2018    OOPHORECTOMY  2005    laparotomy    PR CYSTOURETHROSCOPY N/A 08/30/2017    Procedure: CYSTOURETHROSCOPY (SEPARATE PROCEDURE);  Surgeon: Forbes Cellar, MD;  Location: [facility] OR [facility];  Service: Advanced Laparoscopy    PR EXCISION TURBINATE,SUBMUCOUS Bilateral 01/04/2021    Procedure: SUBMUCOUS RESECTION INFERIOR TURBINATE, PARTIAL OR COMPLETE, ANY METHOD;  Surgeon: Adam Swaziland Kimple, MD;  Location: [facility] OR [facility];  Service: ENT    PR INCISE TENDON/MUSCLE,SHLDR,SINGLE Right 11/16/2021    Procedure: RIGHT PEC MINOR RELEASE;  Surgeon: Thora Lance, MD;  Location: MAIN OR [place];  Service: Vascular    PR LAPAROSCOPY W TOT HYSTERECTUTERUS <=250 GRAM  W TUBE/OVARY N/A 08/30/2017    Procedure: LAPAROSCOPY, SURGICAL, WITH TOTAL HYSTERECTOMY, FOR UTERUS 250 G OR LESS; W/REMOVAL TUBE(S) AND/OR OVARY(S);  Surgeon: Forbes Cellar, MD;  Location: [facility] OR [facility];  Service: Advanced Laparoscopy    PR MASTECTOMY, SIMPLE, COMPLETE Bilateral 01/09/2020    Procedure: MASTECTOMY, SIMPLE, COMPLETE;  Surgeon: Carola Rhine, MD;  Location: MAIN OR [place];  Service: Plastics    PR NEUROPLASTY BRACHIAL PLEXUS,OPEN Right 11/16/2021    Procedure: RIGHT TOS DECOMPRESSION;  Surgeon: Thora Lance, MD;  Location: MAIN OR ;  Service: Vascular    PR REPAIR OF NASAL SEPTUM N/A 01/04/2021    Procedure: SEPTOPLASTY OR SUBMUCOUS RESECTION, WITH OR WITHOUT CARTILAGE SCORING, CONTOURING OR REPLACEMENT WITH GRAFT;  Surgeon: Adam Swaziland Kimple, MD;  Location: [facility] OR [facility];  Service: ENT    PR UPPER GI ENDOSCOPY,BIOPSY N/A 08/13/2019    Procedure: UGI ENDOSCOPY; WITH BIOPSY, SINGLE OR MULTIPLE;  Surgeon: Alfred Levins, MD;  Location: [facility] PROCEDURES [facility];  Service: Gastroenterology    SINUS SURGERY          Meds:  Current Outpatient Medications on File Prior to Visit   Medication Sig Dispense Refill    albuterol 2.5 mg /3 mL (0.083 %) nebulizer solution Inhale 3 mL (2.5 mg total) by nebulization every six (6) hours as needed for wheezing or shortness of breath. 90 mL 1  ascorbic acid, vitamin C, (VITAMIN C) 100 MG tablet Take 1 tablet (100 mg total) by mouth daily.      azelastine (ASTELIN) 137 mcg (0.1 %) nasal spray Instill 2 sprays into each nostril two (2) times a day as needed for rhinitis. 30 mL 1    budesonide-formoterol (SYMBICORT) 160-4.5 mcg/actuation inhaler Inhale 1 puff by mouth Two (2) times a day. 10.2 g 5    cetirizine (ZYRTEC) 10 MG tablet Take 1 tablet (10 mg total) by mouth daily. 30 tablet 12    clindamycin (CLEOCIN T) 1 % lotion 1 Application as needed.      clonazePAM (KLONOPIN) 0.5 MG tablet Take half tablet (0.25 mg total) by mouth once daily as needed for anxiety. For the next two weeks, every other day, may take an additional half a tablet as needed. After two weeks, decrease to taking half a tablet once daily as needed, without the additional dose 30 tablet 0    dextroamphetamine-amphetamine (ADDERALL) 20 mg tablet Take 1 tablet (20 mg total) by mouth as needed for up to 10 doses. 10 tablet 0    [EXPIRED] doxycycline (VIBRA-TABS) 100 MG tablet Take 1 tablet (100 mg total) by mouth two (2) times a day for 10 days. 20 tablet 0    emtricitabine-tenofovir alafen (DESCOVY) 200-25 mg tablet Take 1 tablet by mouth daily. 90 tablet 0    EPINEPHrine (EPIPEN) 0.3 mg/0.3 mL injection Inject 0.3 mL (0.3 mg total) under the skin once for 1 dose. 2 each 0    famotidine (PEPCID) 40 MG tablet Take 1 tablet (40 mg total) by mouth every evening. 30 tablet 1    gabapentin (NEURONTIN) 250 mg/5 mL oral solution Take 12 mL (600 mg total) by mouth Three (3) times a day. 1080 mL 2    gabapentin (NEURONTIN) 250 mg/5 mL oral solution Take 12 mL (600 mg total) by mouth Three (3) times a day. 1080 mL 1    hydrOXYzine (ATARAX) 25 MG tablet Take 1 tablet (25 mg total) by mouth.      ibuprofen (MOTRIN) 800 MG tablet Take 1 tablet (800 mg total) by mouth every eight (8) hours as needed for pain. 60 tablet 2    inhalational spacing device Spcr Use as directed with albuterol and symbicort 1 each 1    [EXPIRED] metroNIDAZOLE (FLAGYL) 500 MG tablet Take 1 tablet (500 mg total) by mouth two (2) times a day for 7 days. 14 tablet 0    nebulizer and compressor (COMP-AIR NEBULIZER COMPRESSOR) Devi 1 each by Miscellaneous route nightly as needed. 1 each 0    needle, disp, 25 Jansen (BD REGULAR BEVEL NEEDLES) 25 Gianpaolo x 5/8 Ndle For subcutaneous hormone injection. 25 each 0    nifedipine 0.3% lidocaine 1.5% in petrolatum ointment Apply a pea-size amount to anal area 3 times a day 100 g 0    omalizumab (XOLAIR) 150 mg/mL syringe Inject 2 mL (300 mg total) under the skin every twenty-eight (28) days. 2 mL 12    pantoprazole (PROTONIX) 20 MG tablet Take 1 tablet (20 mg total) by mouth daily. 30 tablet 1    plecanatide (TRULANCE) 3 mg Tab Take 3 mg by mouth.      propranolol (INDERAL) 40 MG tablet Take 1 tablet (40 mg total) by mouth four (4) times a day.      prucalopride 2 mg Tab Take 1 tablet (2 mg total) by mouth daily. 90 tablet 0    safety needles (BD ECLIPSE)  25 Trayton x 1 Ndle use for testosterone 12 each 0    safety needles (BD SAFETYGLIDE NEEDLE) 18 Elek x 1 1/2 Ndle For drawing hormone injection 25 each 0    syringe, disposable, (EASY TOUCH LUER LOCK SYRINGE) 1 mL Syrg Use for weekly hormone injection. 4 each 0    syringe, disposable, (EASY TOUCH LUER LOCK SYRINGE) 1 mL Syrg Use for testosterone 12 each 0    syringe, disposable, 2.5 mL Syrg Use weekly 30 Syringe 2    testosterone cypionate (DEPOTESTOTERONE CYPIONATE) 200 mg/mL injection Inject 0.2 mL (40 mg total) under the skin every seven (7) days. 2 mL 0    triamcinolone (KENALOG) 0.1 % cream Apply topically Two (2) times a day. 15 g 0     Current Facility-Administered Medications on File Prior to Visit   Medication Dose Route Frequency Provider Last Rate Last Admin    omalizumab [name]) injection 300 mg  300 mg Subcutaneous Q28 Days [name], [name], [name]   300 mg at 05/24/22 1138       Allergies:  Allergies   Allergen Reactions    Latex Rash and Hives    Penicillins Anaphylaxis     Pt can tolerate amoxicillin   Has patient had a PCN reaction causing immediate rash, facial/tongue/throat swelling, SOB or lightheadedness with hypotension: no   Has patient had a PCN reaction causing severe rash involving mucus membranes or skin necrosis: no  Has patient had a PCN reaction that required hospitalization: unknown  Has patient had a PCN reaction occurring within the last 10 years: no  If all of the above answers are NO, then may proceed with Cephalosporin use.    Shellfish Containing Products Anaphylaxis    Other Rash     Nifedipine 0.3%, Lidocaine 1.5% in petrolatum ointment caused rash on his Bottom    Latex, Natural Rubber Rash    Penicillin Rash        FH:  Family History   Problem Relation Age of Onset    Cancer Mother         breast    Thyroid disease Mother     Hypertension Father     Breast cancer Sister     Cancer Sister         breast    Ovarian cancer Maternal Grandmother     Breast cancer Maternal Grandmother     Cataracts Maternal Grandmother        SH:    Social History     Tobacco Use    Smoking status: Never    Smokeless tobacco: Never   Vaping Use    Vaping status: Never Used   Substance Use Topics    Alcohol use: Not Currently    Drug use: Not Currently     Types: Marijuana     Comment: occasionaly        ROS:  Review of systems was reviewed on attached notes/patient intake forms.     Exam:  LMP 07/20/2017 (Exact Date)     General: well appearing, stated age, no distress   Head - atraumatic, normocephalic   Nose: dorsum midline, no rhinorrhea  Neck: symmetric, trachea midline  Psychiatric: alert and oriented, appropriate mood and affect   Respiratory: no audible wheezing or stridor, normal work of breathing  Neurologic - cranial nerves 2-12 grossly intact  Facial Strength - HB 1/6 bilaterally    Ears - External ear- normal, no lesions, no malformations   Otoscopy - ***  Tuning fork test - Weber ***    Audiogram: The audiogram was personally reviewed and interpreted. This demonstrates ***    Tympanogram: The tympanogram was personally reviewed and interpreted. This demonstrates ***    Imaging: N/A    I personally reviewed outside records.    Assessment/Plan:  Vincent Waters is a 42 y.o. adult with a history of ***     The patient/family voiced understanding of the plan as detailed above and is in agreement. I appreciate the opportunity to participate in his care.    I attest to the above information and documentation. However, this note has been created using voice recognition software and may have errors that were not dictated and not seen in editing.

## 2022-12-26 ENCOUNTER — Ambulatory Visit: Admit: 2022-12-26 | Discharge: 2022-12-27 | Payer: PRIVATE HEALTH INSURANCE

## 2022-12-26 LAB — COMPREHENSIVE METABOLIC PANEL
ALBUMIN: 3.8 g/dL (ref 3.4–5.0)
ALKALINE PHOSPHATASE: 112 U/L (ref 46–116)
ALT (SGPT): 10 U/L (ref 10–49)
ANION GAP: 2 mmol/L — ABNORMAL LOW (ref 5–14)
AST (SGOT): 20 U/L (ref <=34)
BILIRUBIN TOTAL: 0.4 mg/dL (ref 0.3–1.2)
BLOOD UREA NITROGEN: 11 mg/dL (ref 9–23)
BUN / CREAT RATIO: 10
CALCIUM: 9.6 mg/dL (ref 8.7–10.4)
CHLORIDE: 110 mmol/L — ABNORMAL HIGH (ref 98–107)
CO2: 30 mmol/L (ref 20.0–31.0)
CREATININE: 1.1 mg/dL — ABNORMAL HIGH
EGFR CKD-EPI (2021) MALE: 87 mL/min/{1.73_m2} (ref >=60)
GLUCOSE RANDOM: 83 mg/dL (ref 70–99)
POTASSIUM: 5 mmol/L (ref 3.5–5.1)
PROTEIN TOTAL: 7.1 g/dL (ref 5.7–8.2)
SODIUM: 142 mmol/L (ref 135–145)

## 2022-12-26 LAB — CBC W/ AUTO DIFF
BASOPHILS ABSOLUTE COUNT: 0 10*9/L (ref 0.0–0.1)
BASOPHILS RELATIVE PERCENT: 0.6 %
EOSINOPHILS ABSOLUTE COUNT: 0 10*9/L (ref 0.0–0.5)
EOSINOPHILS RELATIVE PERCENT: 0.6 %
HEMATOCRIT: 46 % (ref 36.0–46.0)
HEMOGLOBIN: 15.1 g/dL (ref 12.1–15.7)
LYMPHOCYTES ABSOLUTE COUNT: 1.5 10*9/L (ref 1.1–3.6)
LYMPHOCYTES RELATIVE PERCENT: 29.6 %
MEAN CORPUSCULAR HEMOGLOBIN CONC: 32.9 g/dL (ref 32.0–36.0)
MEAN CORPUSCULAR HEMOGLOBIN: 26.6 pg (ref 25.9–32.4)
MEAN CORPUSCULAR VOLUME: 80.9 fL (ref 77.6–95.7)
MEAN PLATELET VOLUME: 7.7 fL (ref 6.8–10.7)
MONOCYTES ABSOLUTE COUNT: 0.3 10*9/L (ref 0.3–0.8)
MONOCYTES RELATIVE PERCENT: 5.3 %
NEUTROPHILS ABSOLUTE COUNT: 3.3 10*9/L (ref 1.8–7.8)
NEUTROPHILS RELATIVE PERCENT: 63.9 %
PLATELET COUNT: 315 10*9/L (ref 150–450)
RED BLOOD CELL COUNT: 5.68 10*12/L — ABNORMAL HIGH (ref 4.11–5.36)
RED CELL DISTRIBUTION WIDTH: 14.5 % (ref 12.2–15.2)
WBC ADJUSTED: 5.2 10*9/L (ref 3.6–11.2)

## 2022-12-26 LAB — LIPID PANEL
CHOLESTEROL/HDL RATIO SCREEN: 3 (ref 1.0–4.5)
CHOLESTEROL: 157 mg/dL (ref <=200)
HDL CHOLESTEROL: 52 mg/dL (ref 40–60)
LDL CHOLESTEROL CALCULATED: 94 mg/dL (ref 40–99)
NON-HDL CHOLESTEROL: 105 mg/dL (ref 70–130)
TRIGLYCERIDES: 54 mg/dL (ref 0–150)
VLDL CHOLESTEROL CAL: 10.8 mg/dL (ref 9–37)

## 2022-12-26 LAB — C-REACTIVE PROTEIN: C-REACTIVE PROTEIN: 5 mg/L (ref <=10.0)

## 2022-12-26 LAB — SEDIMENTATION RATE: ERYTHROCYTE SEDIMENTATION RATE: 7 mm/h (ref 0–15)

## 2022-12-26 LAB — SCL-70 ANTIBODY, IGG: ANTI-SCL-70 AB: NEGATIVE

## 2022-12-26 LAB — CENTROMERE ANTIBODY, IGG: ANTI-CENP AB: NEGATIVE

## 2022-12-26 LAB — THYROID FUNCTION CASCADE: THYROID STIMULATING HORMONE: 0.632 u[IU]/mL (ref 0.550–4.780)

## 2022-12-26 MED ORDER — TRULANCE 3 MG TABLET
ORAL_TABLET | Freq: Every day | ORAL | 0 refills | 7 days | Status: CP | PRN
Start: 2022-12-26 — End: 2023-01-25
  Filled 2023-01-27: qty 30, 30d supply, fill #0

## 2022-12-26 NOTE — Unmapped (Signed)
[facility]  Established Patient Clinic Note    Assessment/Plan:   Vincent Waters (he/him) is a 42 y.o.adult here to meet new PCP. Will work on establishing with rheumatology for workup of positive ANA & c/f scleroderma given associated GI-manifestations. Follow up in 1 mo.     Problem List Items Addressed This Visit       Attention deficit hyperactivity disorder (ADHD), combined type     // ADHD   - On Adderall 20 mg every afternoon as needed currently and denies no currently reported side effects on the currently prescribed medication  - Notices improvement in work performance, difficulty organizing tasks and activities, and talking execessively  - Continue Immediate-release dextroamphetamine/amphetamine (Adderall) 20 mg PRN, next fill Aug 09th 2024.           Relevant Medications    dextroamphetamine-amphetamine (ADDERALL) 20 mg tablet (Start on 01/27/2023)    Benzodiazepine dependence, continuous (CMS-HCC) - Primary     Chronic BZ  Historically utox within compliance. Last obtained urine within 6 mo in April 2024.   - On Klonopin 0.5mg  tab w/ #30 tab refill for 30 days. Last fill 11/28/22.   - Next fill 12/28/22 provided  - next Utox due 03/2023         Relevant Medications    clonazePAM (KLONOPIN) 0.5 MG tablet (Start on 12/28/2022)    Constipation     Hx of constipation had been seen by [facility], taking trulance. Discussed short-term use of medication (in 7 days intervals) as needed & encouraged re-establishing with [facility]. Pt will call & schedule today.          Relevant Medications    plecanatide (TRULANCE) 3 mg Tab    Other Relevant Orders    Thyroid Function Cascade    Positive ANA (antinuclear antibody)     Pt previously seen by Dr Arlyce Dice, working on rheumatology referral for evaluation of possible scleroderma, presenting with several years long hx of GI symptoms (dysphagia, choking sensation, gastroparesis, alternating constipation/diarrhea), positive ANA with speckled pattern. Pt has hx of OSA, but no evidence of completed spirometry or known hx of Pulmonary arterial hypertension (last echo 2021). Reports continued generalized joint pain and weakness and referral was placed to Rheumatology at last visit, response to referral identifying that patient had been seen at different clinic. New referral placed based on patient preference & insurance to follow up with [facility].   - follow up 1 mo  - follow up CRP, CBC, CMP, ESR, Thyroid Cascade  - follow up Scleroderma specific IGGs (SCL-70, Anti-Centromere)  - new referral placed to Rheumatology  - hold on repeat CXR at this time, consider evaluation for pulmonary fibrosis if symptoms arise  - encouraged follow up with GI for consideration of endoscopy/colonoscopy as indicated          Relevant Orders    Ambulatory referral to Rheumatology    Sedimentation Rate (Completed)    C-reactive protein    Comprehensive Metabolic Panel    CBC w/ Differential (Completed)    Scleroderma (SCL-70) Antibody, IgG    Centromere Antibody, IgG     Other Visit Diagnoses       Encounter for lipid screening for cardiovascular disease        Relevant Orders    Lipid Panel              Attending: Dr. Marchia Bond    Subjective   Vincent Waters is a 42 y.o. adult  coming  to clinic today for the following issues:    Chief Complaint   Patient presents with    Medication Refill    Knee Pain     -R     HPI:    Resuming care from prior PCP - Dr. Arlyce Dice  Adderall & Klonopin refills today  Referral to psychiatry placed 11/17/22, not yet followed up.   Health maintenance - Lipid, Flu    GI issues - c/f scleroderma  Tried going to [facility]. Has gastroparesis, severe, regurgitates at night. Has sleep apnea, hiatal hernia. Positive ANA, had been going to rheumatology. Tried GERD diet, not taking trulance daily but in intervals for constipation.     Hip pain in 2022, mobility and walking was limited. Has had MRIs done. Left hip has seen ortho, cannot find etiology.     I have reviewed the problem list, medications, and allergies and have updated/reconciled them if needed.    Vincent Waters  reports that he has never smoked. He has never used smokeless tobacco.  Health Maintenance   Topic Date Due    Influenza Vaccine (1) 02/19/2023    Lipid Screening  01/22/2025    DTaP/Tdap/Td Vaccines (2 - Td or Tdap) 08/19/2027    Hepatitis C Screen  Completed    COVID-19 Vaccine  Completed    Pneumococcal Vaccine 0-64  Aged Out       Objective     VITALS: BP 112/82 (BP Site: R Arm, BP Position: Sitting, BP Cuff Size: X-Large)  - Pulse 79  - Temp 36.9 ??C (98.4 ??F) (Temporal)  - Ht 162.6 cm (5' 4.02)  - Wt 74.9 kg (165 lb 3.2 oz)  - LMP 07/20/2017 (Exact Date)  - BMI 28.34 kg/m??     Physical Exam  Vitals reviewed.   Constitutional:       Appearance: Normal appearance. He is normal weight.   HENT:      Head: Normocephalic and atraumatic.      Right Ear: External ear normal.      Left Ear: External ear normal.   Eyes:      Conjunctiva/sclera: Conjunctivae normal.   Cardiovascular:      Rate and Rhythm: Normal rate and regular rhythm.      Heart sounds: Normal heart sounds.   Pulmonary:      Effort: Pulmonary effort is normal.      Breath sounds: Normal breath sounds.   Musculoskeletal:         General: Normal range of motion.      Right lower leg: No edema.      Left lower leg: No edema.   Neurological:      General: No focal deficit present.      Mental Status: He is oriented to person, place, and time.   Psychiatric:         Mood and Affect: Mood normal.         Behavior: Behavior normal.         LABS/IMAGING  Office Visit on 12/26/2022   Component Date Value    Sed Rate 12/26/2022 7     WBC 12/26/2022 5.2     RBC 12/26/2022 5.68 (H)     HGB 12/26/2022 15.1     HCT 12/26/2022 46.0     MCV 12/26/2022 80.9     MCH 12/26/2022 26.6     MCHC 12/26/2022 32.9     RDW 12/26/2022 14.5     MPV 12/26/2022 7.7  Platelet 12/26/2022 315     Neutrophils % 12/26/2022 63.9     Lymphocytes % 12/26/2022 29.6     Monocytes % 12/26/2022 5.3 Eosinophils % 12/26/2022 0.6     Basophils % 12/26/2022 0.6     Absolute Neutrophils 12/26/2022 3.3     Absolute Lymphocytes 12/26/2022 1.5     Absolute Monocytes 12/26/2022 0.3     Absolute Eosinophils 12/26/2022 0.0     Absolute Basophils 12/26/2022 0.0     Hypochromasia 12/26/2022 Slight (A)          Juanda Chance, MD  Family Medicine Resident PGY-3  [facility] [place] [facility]  [address] [place], [place] [address]  Telephone [phone]  Fax [phone]  [email or web address]

## 2022-12-26 NOTE — Unmapped (Signed)
Immediately after or during the visit, I reviewed with the resident the medical history and the resident’s findings on physical examination.  I discussed with the resident the patient’s diagnosis and concur with the treatment plan as documented in the resident note. Debi Cousin Louw Harshan Kearley, MD

## 2022-12-26 NOTE — Unmapped (Signed)
//   ADHD   - On Adderall 20 mg every afternoon as needed currently and denies no currently reported side effects on the currently prescribed medication  - Notices improvement in work performance, difficulty organizing tasks and activities, and talking execessively  - Continue Immediate-release dextroamphetamine/amphetamine (Adderall) 20 mg PRN, next fill Aug 09th 2024.

## 2022-12-26 NOTE — Unmapped (Addendum)
Chronic BZ  Historically utox within compliance. Last obtained urine within 6 mo in April 2024.   - On Klonopin 0.5mg  tab w/ #30 tab refill for 30 days. Last fill 11/28/22.   - Next fill 12/28/22 provided  - next Utox due 03/2023

## 2022-12-26 NOTE — Unmapped (Signed)
Pt previously seen by Dr Arlyce Dice, working on rheumatology referral for evaluation of possible scleroderma, presenting with several years long hx of GI symptoms (dysphagia, choking sensation, gastroparesis, alternating constipation/diarrhea), positive ANA with speckled pattern. Pt has hx of OSA, but no evidence of completed spirometry or known hx of Pulmonary arterial hypertension (last echo 2021). Reports continued generalized joint pain and weakness and referral was placed to Rheumatology at last visit, response to referral identifying that patient had been seen at different clinic. New referral placed based on patient preference & insurance to follow up with [facility].   - follow up 1 mo  - follow up CRP, CBC, CMP, ESR, Thyroid Cascade  - follow up Scleroderma specific IGGs (SCL-70, Anti-Centromere)  - new referral placed to Rheumatology  - hold on repeat CXR at this time, consider evaluation for pulmonary fibrosis if symptoms arise  - encouraged follow up with GI for consideration of endoscopy/colonoscopy as indicated

## 2022-12-26 NOTE — Unmapped (Signed)
Hx of constipation had been seen by [facility], taking trulance. Discussed short-term use of medication (in 7 days intervals) as needed & encouraged re-establishing with [facility]. Pt will call & schedule today.

## 2022-12-26 NOTE — Unmapped (Signed)
Pre-procedure call made and RN spoke with patient.    Denies recent infection/antibiotics.   Denies being diabetic.  Denies taking any recent blood thinners/NSAIDs.   Denies any electronic implants      Patient informed to arrive 30 minutes before procedure appointment time.  Patient verbalized understanding to all.

## 2022-12-27 ENCOUNTER — Ambulatory Visit: Admit: 2022-12-27 | Payer: PRIVATE HEALTH INSURANCE

## 2022-12-28 MED ORDER — CLONAZEPAM 0.5 MG TABLET
ORAL_TABLET | 0 refills | 0 days | Status: CP
Start: 2022-12-28 — End: ?
  Filled 2023-01-03: qty 30, 46d supply, fill #0

## 2022-12-29 ENCOUNTER — Ambulatory Visit
Admit: 2022-12-29 | Discharge: 2022-12-30 | Payer: PRIVATE HEALTH INSURANCE | Attending: Anesthesiology | Primary: Anesthesiology

## 2022-12-29 DIAGNOSIS — G528 Disorders of other specified cranial nerves: Principal | ICD-10-CM

## 2022-12-29 MED ADMIN — lidocaine (XYLOCAINE) 5 mg/mL (0.5 %) injection 3 mL: 3 mL | @ 12:00:00 | Stop: 2022-12-29

## 2022-12-29 MED ADMIN — lidocaine (XYLOCAINE) 20 mg/mL (2 %) injection 5 mL: 5 mL | @ 12:00:00 | Stop: 2022-12-29

## 2022-12-29 NOTE — Unmapped (Addendum)
PROCEDURE; Right spinal accessory nerve block with pulsed RFA under ultrasound guidance  PRE-PROCEDURE DIAGNOSIS: Spinal accessory neuralgia  POST-PROCEDURE DIAGNOSIS: Same   PERFORMED BY: Dr. Oda Kilts  ASSISTANT: Dr Pricilla Riffle  Anesthesia: Local without IV sedation      BRIEF HISTORY:   Vincent Waters is a 42 y.o. 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 Gayla Doss in May 2023.  He was first seen by our clinic in March 2023 for diagnostic injections.  His range of motion has an increased postoperatively, but still has tight, spasming pain in the right neck and shoulder.  His primary pain generator includes myofascial pain post-TOS decompression. Patient presents with worsened pain localized to right neck and shoulder area. He denies radiation of pain down his RUE. TPI injections have been helpful but the bulk of the pain is localized to the trapezius. He previously underwent a right-sided spinal accessory nerve block with steroid, which provided 100% relief immediately, but only helped for a few days.  We had a good discussion and will trial a nerve block with pulsed RFA.    DESCRIPTION OF THE PROCEDURE:   Informed consent was obtained and potential risks discussed including, but not limited to: bleeding, bruising, severe allergic reaction to components of the injection materials, infection, nerve damage, the possibility of no benefit (pain relief) derived from the injection, or in rare occasions worsening of pain. Questions were answered to the patient's satisfaction and the patient wishes to proceed. Alternative options for treatment have previously been discussed and explored with the patient. The patient is not taking antiplatelet or anticoagulation medications and does not have a driver today.    The patient was taken to the procedure room and placed supine . The procedure was performed under real-time ultrasound guidance. The patient's heart rate, blood pressure, and oxygen saturation were continuously monitored throughout the procedure. A timeout was performed and the correct side confirmed with the patient. Initial scout images were performed with the ultrasound device. The area of interest was sterilely prepped with chlorhexidine and sterilely draped. The physician wore a hat, mask and sterile gloves.     On the right, using a 25 Qusay needle, the skin and subcutaneous tissues were anesthetized with 0.5% lidocaine. Then, using ultrasound guidance, a 20 Merrel 10cm cannula with a 10mm curved active tip was advanced via an in-plane technique (from the posterior aspect of the U/S probe) until the tip was in the superficial fascial plane adjacent to the area of the spinal accessory nerve just posterior to the SCM.  With good impedances, sensory stimulation at 50Hz  was obtained below 1 V. Stimulation of this area reproduced the patients pain. Motor stimulation caused right trapezius activation.  Pulsed RF was performed at 42 degrees celsius for 120 seconds. The cannula was repositioned again near the area of the nerve and motor stimulation was obained and pulsed RF was performed at this location.  After negative aspiration for heme, 5 mL of 2% lidocaine was injected incrementally after negative aspiration through each cannula under ultrasound guidance.   The cannula was then removed, the patient was cleaned, and a bandage applied.     The procedure was not repeated on the contralateral side in the exact same fashion.    The patient did tolerate the procedure well and there were no apparent complications. All injection sites were sterilely dressed. The patient was discharged after an appropriate  period of observation and post procedural education was given.     Pre-procedure pain score was 7/10  Post-procedure pain score was 0/10  Complications: none    DISPO:    - follow up on 01/17/2023

## 2022-12-29 NOTE — Unmapped (Signed)
AVS reviewed with patient and he verbalized understanding to all.

## 2022-12-29 NOTE — Unmapped (Signed)
POST PROCEDURE INSTRUCTIONS   You may apply an ice pack 20-30 minutes at a time to the injection site if you experience soreness.     Keep the injection site clean and dry. You make remove the band-aid one day following the procedure.     You may take a shower but AVOID getting in to baths, pools or whirlpools for 48 HOURS AFTER THE PROCEDURE.       ACTIVITY   Refrain from heavy activity for the next 24 to 48 hours. General walking is okay. You may resume your normal activities the day following the procedure.   You may start or resume your individualized exercise program or physical therapy 48 hours after the procedure.  IF YOU'VE HAD TRIGGER POINT INJECTIONS, YOU MAY CONTINUE EXERCISE OR PHYSICAL THERAPY WITHOUT DELAY.  MEDICATIONS   Please note that it is okay to continue other prescribed medications (blood pressure, insulin, water pill, depression/anxiety pill, etc.) as well as other prescribed pain medications such as Neurontin, Lyrical, Celebrex, Ultram, Vicodin, Norco and acetaminophen (Tylenol).   SIDE EFFECTS   Increase in pain during the first 24 to 48 hours.     You might experience:   1. Mild to moderate swelling at the joint.   2. Possible bruising at the injection site.     WHEN TO CALL THE DOCTOR/NURSE   Severe pain, worse or different that the pain you had before the procedure.     Fever or chills.     Redness, or swelling around the injection site.     Call the Pain Management  Procedural nurses [phone],  during normal business hours (7 am-3 pm). If it is AFTER HOURS or during a weekend or holiday, call the hospital operator and ask for the Anesthesia Pain physician on call at [phone].   FOR EMERGENCIES, CALL 911 OR GO TO THE NEAREST HOSPITAL EMERGENCY DEPARTMENT.   ?   TO SCHEDULE APPOINTMENTS OR FOR QUESTIONS RELATED TO MEDICATIONS   Call the Pain Management Clinic at [phone]

## 2023-01-03 ENCOUNTER — Ambulatory Visit
Admit: 2023-01-03 | Payer: PRIVATE HEALTH INSURANCE | Attending: Rehabilitative and Restorative Service Providers" | Primary: Rehabilitative and Restorative Service Providers"

## 2023-01-03 ENCOUNTER — Ambulatory Visit
Admit: 2023-01-03 | Discharge: 2023-01-30 | Payer: PRIVATE HEALTH INSURANCE | Attending: Rehabilitative and Restorative Service Providers" | Primary: Rehabilitative and Restorative Service Providers"

## 2023-01-03 NOTE — Unmapped (Signed)
[facility] Refill Coordination Note    Specialty Medication(s) to be Shipped:   CF/Pulmonary/Asthma: Xolair 150 mg/mL    Other medication(s) to be shipped: No additional medications requested for fill at this time     [facility], DOB: [birth year]  Phone: There are no phone numbers on file.      All above HIPAA information was verified with patient.     Was a Nurse, learning disability used for this call? No    Completed refill call assessment today to schedule patient's medication shipment from the [facility] [phone]).  All relevant notes have been reviewed.     Specialty medication(s) and dose(s) confirmed: Regimen is correct and unchanged.   Changes to medications: Stefanos reports no changes at this time.  Changes to insurance: No  New side effects reported not previously addressed with a pharmacist or physician: None reported  Questions for the pharmacist: No    Confirmed patient received a Conservation officer, historic buildings and a Surveyor, mining with first shipment. The patient will receive a drug information handout for each medication shipped and additional FDA Medication Guides as required.       DISEASE/MEDICATION-SPECIFIC INFORMATION        For patients on injectable medications: Patient currently has 0 doses left.  Next injection is scheduled for 01/19/23.    SPECIALTY MEDICATION ADHERENCE     Medication Adherence    Patient reported X missed doses in the last month: 0  Specialty Medication: XOLAIR 150 mg/mL syringe (omalizumab)  Informant: patient              Were doses missed due to medication being on hold? No    Xolair 150 mg/ml: 22 days of medicine on hand       REFERRAL TO PHARMACIST     Referral to the pharmacist: Not needed      [facility]     Shipping address confirmed in Epic.       Delivery Scheduled: Yes, Expected medication delivery date: 01/13/23.     Medication will be delivered via Same Day Courier to the prescription address in Epic WAM.    Camillo Flaming, PharmD [facility] Specialty Pharmacist

## 2023-01-03 NOTE — Unmapped (Signed)
[facility] [place]  OUTPATIENT PHYSICAL THERAPY  01/03/2023  Note Type: Evaluation       Patient Name: Vincent Waters  Date of Birth:[birth year]  Diagnosis:   Encounter Diagnoses   Name Primary?    Bilateral hip pain Yes    Weakness of left lower extremity      Referring MD:  Dawna Part, *     Date of Onset of Impairment-01/02/2021  Date PT Care Plan Established or Reviewed-01/03/2023  Date PT Treatment Started-01/03/2023   Plan of Care Effective Date:          Assessment/Plan:    Assessment  Assessment details:    42 y.o. adult who presents to physical therapy with signs and symptoms consistent with bilateral hip and low back pain, which is limiting his ability to perform daily functional activities such as standing, walking, sitting, sit<>stand, bending and lifting.  Dontea seems to have a multifactorial process happening: 1) His central low back pain seems to be posturally related (increased lumbar lordosis) that is making it difficult to find a comfortable position to sleep in; 2) He likely has a right labral tear which is contributing to his right hip pain; and 3) He seems to have spasms in his left posterior hip musculature that is causing pain, weakness and numbness into his left lower extremity (doesn't appear to be coming from his back, but rather, appears to be a sciatic nerve pinch of some sort).  The only thing that is a bit odd is his quadriceps weakness, which does not relate to a sciatic nerve pinch.  We discussed our plan at present (flexion based strengthening to address his postural issues, calming his hip musculature with manual therapy and stretching, followed by hip strengthening).  He agrees with this plan.  Written instructions given for pelvic tilt and piriformis stretching.  He committed to performing them consistently as part of his home exercise program.  Recommended he put a pillow under his knees when he lies flat to help reduce his lumbar lordosis and he agreed to do this.  All questions answered.  This patient requires skilled physical therapy services to address the outlined impairments in order to return to his desired level of function.           Impairments: impaired ADLs, impaired sensation, pain, trigger points, decreased strength, hypertonicity, muscular restrictions and postural weakness      Personal Factors/Comorbidities: 3+    Specific Comorbidities: Anxiety, Asthma, GERD    Examination of Body Systems: musculoskeletal and neurological    Clinical Presentation: stable    Clinical Decision Making: moderate    Prognosis: good prognosis    Positive Prognosis Rationale: age, motivated for treatment, history of compliance, medication compliance, Pain Status, medical status/condition, severity of symptoms and language.  Negative Prognosis Rationale: behavior, chronicity of condition and strength.      Therapy Goals      Goals:      Short Term Goals:  In 6 weeks:  Goal #1: Patient will be independent with progressively monitored home exercise program.   Baseline/Current: Patient is unfamiliar with home exercise program.     Goal #2: Patient will demonstrate normal palpation for decreased pain and proper biomechanical function.  Baseline/Current: Patient has spasmed left hip musculature.    Goal #3: Patient will report improved sleeping.  Baseline/Current: Patient has difficulty finding a comfortable position to sleep in.    Long Term Goals:  In 12 weeks:  Goal #1:  Patient will demonstrate left hip strength within normal limits without pain in order to allow for functional tasks such as symmetrical walking, squatting and negotiating stairs.   Baseline/Current: Patient has painful and limited left hip strength (see manual muscle testing).      Goal #2: Patient will report minimal symptoms with weight bearing activities such as standing, walking and sit<>stand.  Baseline/Current: Patient has pain with these activities.    Goal #3: Patient will report centralization of lower extremity symptoms (pain, tingling, numbness and weakness).  Baseline/Current: Patient has pain and weakness that radiates into his left lower extremity.    Goal #4: Patient's FOTO score will improve from 26 at initial evaluation to >/= 54 at discharge in order to demonstrate a significant improvement in self-reported outcome measures that directly result from his course of physical therapy.   Baseline/Current: FOTO = 26    Plan    Therapy options: will be seen for skilled physical therapy services    Planned therapy interventions: 20560, 20561-Dry Needling 1-2, 3+ areas, 97110-Therapeutic Exercises, 97112-Neuromuscular Re-education, 97140-Manual Therapy and 97530-Therapeutic Activities      Frequency: 1x week    Duration in weeks: 12    Education provided to: patient.    Education provided: Anatomy, HEP, Symptom management and Treatment options and plan    Education results: needs reinforcement, verbalized good understanding, demonstrates understanding and needs further instruction.    Communication/Consultation: N/A.    Next visit plan:        Flexion based strengthening for back (reduce lordosis), TDN/STM left ER's and Abductors, stretching, left extension hip strengthening    Total Session Time: 50    Treatment rendered today:      PT Evaluation: 30 minutes    Therapeutic Exercise: 15 minutes  - Patient Education: Examination Findings, Plan of Care, Home Exercise Program With Handout, Body Mechanics, and Sleeping Posture  - Supine Pelvic Tilt  - Left Figure 4 Stretch         Subjective:     History of Present Condition     Date of onset:  01/02/2021    Date is approximation?: yes    Explanation:  Patient reports symptoms started in 2022.    History of Present Condition/Chief Complaint:  Vincent Waters is a 42 y.o. right hand dominant adult who presents to outpatient physical therapy with bilateral hip pain that began in 2022.  Patient cannot remember any specific traumatic mechanism of injury that may have been the result of his symptoms.  He remembers that he woke up one day and his left hip started aching, and then the right one started hurting.  Left hurts more than the right.  Right hip clicks when he moves it.  He also has central low back pain.  Subjective:  Mr. Ortez reports pain and difficulty with sitting, standing, lying supine, stairs, right side lying, and left side lying.  He reports relief with rest, activity modification, heat, and Voltaren.  Mr. Stetzer denies any tingling, bowel/bladder/sexual dysfunction, nausea, nystagmus, dysphagia, dysarthria, dizziness, diplopia, diaphoresis or drop attacks at this time.  He does report bilateral lateral shin and lateral/plantar foot numbness.  He does report some intermittent foot drop and weakness.  Pain:     Current pain rating:  6    At best pain rating:  0    At worst pain rating:  10  Location:  Left lateral/posterior hip, right angerior/groin/lateral    Quality:  Numbing, aching and throbbing    Relieving factors:  Heat, medications, rest and other (stretching)    Aggravating factors:  Bending, lifting, performance of leg dominant activites, walking, standing and rising from sitting    Pain related Behaviors:  Avoidance    Progression:  No change    Red Flags:  None    Precautions:  None    Current Braces/Orthoses:  None    Equipment Currently Used:  None    Physical limitation(s):  2 year history of hip pain and extended history of low back pain that makes weight bearing activities difficult  Current Functional Status:  Disturbed sleep, limited lifting, limited bending, limited household activities, limited sitting tolerance and limited work capacity  Social Support:     Lives Environment:  One-story house    Lives with:  Alone    Hand dominance:  Right  Barriers to Learning:  No Barriers    Work/School:  Biomedical scientist  Diagnostic Tests:     X-ray: normal (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    Right Hip MRI 09/14/2022: Suspected right acetabular labral tear.  Consider MR arthrography for further evaluation.  Patient Goals:     Patient/Family goals for therapy:  Decreased pain, improved ambulation, improved sitting tolerance, improved sleep, improved standing tolerance, increased strength and return to recreational activites      Objective:     Static Posture Assessment   Lumbar Spine-Increased lordosis.     Neurological Testing   Sensation  Lumbar  Left  Diminished: light touch  Right  Diminished: light touch  Comments  Left light touch: Lateral shin, lateral foot, plantar foot  Right light touch: Lateral shin, lateral foot, plantar foot  Reflexes  Left  Patellar (L4): normal (2+)  Achilles (S1): normal (2+)  Right  Patellar (L4): normal (2+)  Achilles (S1): normal (2+)    Palpation   Left Hip   Muscle spasm in the gluteus medius and piriformis.    Range of Motion  Hip  Active ROM  Left Hip-Active ROM  Normal active range of motion  Right Hip-Active ROM  Normal active range of motion  Passive ROM  Left Hip-Passive ROM  Normal passive range of motion  Right Hip-Passive ROM  Normal passive range of motion  Lumbar  Active ROM  Lumbar-Active ROM   Normal active range of motion  Passive ROM  Lumbar-Passive ROM   Normal passive range of motion      No pain elicited with over pressure into any of the joint's movements.     Strength  Left Hip   Planes of Motion   Flexion: 4  Extension: 3- (Can't fully lift leg against gravity) and with pain  Abduction: 5 and with pain  External rotation: 4+  Internal rotation: 5  Right Hip   Planes of Motion   Flexion: 5  Extension: 5  Abduction: 5  External rotation: 4+  Internal rotation: 5  Additional Strength Details   Patient has bilaterally symmetrical and normal lower myotomes with no obvious sign of any neurological weakness EXCEPT for left knee extension and knee flexion (L2 - S1) weakness.,      Tests Lumbar   Left   Negative crossed SLR and passive SLR.   Right   Negative crossed SLR and passive lumbar instability.   Left Hip   Negative FADIR.   Right Hip   Positive FADIR.  I attest that I have reviewed the above information.  Signed: Patton Salles, PT  01/03/2023 3:12 PM

## 2023-01-12 ENCOUNTER — Ambulatory Visit
Admit: 2023-01-12 | Discharge: 2023-01-13 | Payer: PRIVATE HEALTH INSURANCE | Attending: Student in an Organized Health Care Education/Training Program | Primary: Student in an Organized Health Care Education/Training Program

## 2023-01-12 DIAGNOSIS — G4730 Sleep apnea, unspecified: Principal | ICD-10-CM

## 2023-01-12 DIAGNOSIS — J310 Chronic rhinitis: Principal | ICD-10-CM

## 2023-01-12 DIAGNOSIS — H9190 Unspecified hearing loss, unspecified ear: Principal | ICD-10-CM

## 2023-01-12 MED ORDER — IPRATROPIUM BROMIDE 42 MCG (0.06 %) NASAL SPRAY
2 refills | 0 days | Status: CP
Start: 2023-01-12 — End: ?
  Filled 2023-01-27: qty 15, 30d supply, fill #0

## 2023-01-12 NOTE — Unmapped (Signed)
Otolaryngology Clinic Note    Vincent Waters is a 42 y.o. adult is seen in follow up for evaluation of allergic rhinitis s/p septoplasty and inferior turbinate resection on 01/04/2021.    Procedure(s) Performed:   1. Septoplasty (CPT - F4107971)   2. Bilateral submucous resection of inferior turbinates (CPT - 30140)  3. Bilateral nasal endoscopy (CPT - X6950935)    Operative Findings:   1) Septal deviation Deviated to the LEFT  2) Bilateral Inferior turbinate hypertrophy.       History of Present Illness:     The patient is a 42 y.o. adult who  has a past medical history of Anxiety, Asthma, Autoimmune autonomic neuropathy, Breast cyst, Financial difficulties, Lack of access to transportation, Migraines, Neuromuscular disorder (CMS-HCC), Scleroderma (CMS-HCC), and Visual impairment. who presents with nasal congestion, facial pressure, drainage, and ear fullness/pressure for several years. He is generally prescribed antibiotics and steroids with inconsistent relief of symptoms. His symptoms cause him to snore. He also reports choking on food regularly. He currently uses nasal saline rinses daily. He uses Astelin PRN.     He reports a previous IT reduction in 2019 with short-term relief of symptoms.     Patients medical records were personally reviewed:      Pt following with [facility]. Endorses constant stuffed nose, and reports having nasal turbinates shaved down previously, which he feels made his symptoms worse. Also endorses persistent bilateral ear discomfort. Says his girlfriend tells him he snores very loudly. Thinks this may be connected to his headaches. On exam today, TMs clear bilaterally, difficult to visualize tonsils as pt has difficulty relaxing or extending tongue during exam, even with tongue depressor. Says that allergist recommended he see ENT. Will place ENT referral, may benefit from sleep study in the future.   - Continue zyrtec daily + eye drops as needed  - Continue following with [facility]  - ENT referral for persistent nasal congestion + snoring  - Consider sleep study    Update 12/03/2020:   The patient returns today for follow-up. He reports minimal improvement in symptoms from nasal saline rinses and nasal steroid sprays. His congestion is the most bothersome and is not relieved from Afrin or medicated rinses. His symptoms started prior to hormonal therapy. He is on Oxycodone BID.     Update 01/07/2021:  The patient returns today for his first postoperative follow-up. He is doing well following surgery and reports that he is sleeping better. He is currently rinsing 3x per day and taking antibiotics as prescribed. These cause slight nausea, but nothing overtly bothersome.     Update 01/12/2023:  Vincent Waters is a 42 y.o. adult who last seen returns for follow-up. He reports PND and nasal congestion. As well as facial pain and pressure. He does have a history of OSA but has been unable to tolerate CPAP due to severe reflux with regurgitation that is worse at night. He is scheduled to see GI. He has also had a positive ANA in the past and has been referred back to rheumatology. He has been using flonase and astelin daily. He has also notices change in hearing, dizziness and pressure. Has been told that he grinds his teeth, not currently using mouth guard.       A 12 point review of systems was negative except as indicated.  The patient denies fevers, chills, shortness of breath, chest pain, nausea, vomiting, diarrhea, inability to lie flat, dysphagia, odynophagia, hemoptysis, hematemesis, changes in vision, changes in  voice quality, otalgia, otorrhea, vertiginous symptoms, focal deficits, or other concerning symptoms.    Past Medical History     has a past medical history of Anxiety, Asthma, Autoimmune autonomic neuropathy, Breast cyst, Financial difficulties, Lack of access to transportation, Migraines, Neuromuscular disorder (CMS-HCC), Scleroderma (CMS-HCC), and Visual impairment.    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    Current Medications    Current Outpatient Medications   Medication Sig Dispense Refill    albuterol 2.5 mg /3 mL (0.083 %) nebulizer solution Inhale 3 mL (2.5 mg total) by nebulization every six (6) hours as needed for wheezing or shortness of breath. 90 mL 1    ascorbic acid, vitamin C, (VITAMIN C) 100 MG tablet Take 1 tablet (100 mg total) by mouth daily. (Patient not taking: Reported on 12/26/2022)      azelastine (ASTELIN) 137 mcg (0.1 %) nasal spray Instill 2 sprays into each nostril two (2) times a day as needed for rhinitis. 30 mL 1    budesonide-formoterol (SYMBICORT) 160-4.5 mcg/actuation inhaler Inhale 1 puff by mouth Two (2) times a day. 10.2 g 5    cetirizine (ZYRTEC) 10 MG tablet Take 1 tablet (10 mg total) by mouth daily. 30 tablet 12    clindamycin (CLEOCIN T) 1 % lotion 1 Application as needed.      clonazePAM (KLONOPIN) 0.5 MG tablet Take 1/2 tablet (0.25 mg total) by mouth once daily as needed for anxiety.  May take an additional 1/2 tab every other day as needed for 14 days. After 14 days, decrease to taking 1/2 tab once daily as needed, without the additional dose. 30 tablet 0    [START ON 01/27/2023] dextroamphetamine-amphetamine (ADDERALL) 20 mg tablet Take 1 tablet (20 mg total) by mouth as needed for up to 10 doses. 10 tablet 0    emtricitabine-tenofovir alafen (DESCOVY) 200-25 mg tablet Take 1 tablet by mouth daily. 90 tablet 0    EPINEPHrine (EPIPEN) 0.3 mg/0.3 mL injection Inject 0.3 mL (0.3 mg total) under the skin once for 1 dose. 2 each 0    famotidine (PEPCID) 40 MG tablet Take 1 tablet (40 mg total) by mouth every evening. 30 tablet 1    gabapentin (NEURONTIN) 250 mg/5 mL oral solution Take 12 mL (600 mg total) by mouth Three (3) times a day. 1080 mL 2    gabapentin (NEURONTIN) 250 mg/5 mL oral solution Take 12 mL (600 mg total) by mouth Three (3) times a day. 1080 mL 1    hydrOXYzine (ATARAX) 25 MG tablet Take 1 tablet (25 mg total) by mouth. (Patient not taking: Reported on 12/26/2022)      ibuprofen (MOTRIN) 800 MG tablet Take 1 tablet (800 mg total) by mouth every eight (8) hours as needed for pain. 60 tablet 2    inhalational spacing device Spcr Use as directed with albuterol and symbicort (Patient not taking: Reported on 12/26/2022) 1 each 1    nebulizer and compressor (COMP-AIR NEBULIZER COMPRESSOR) Devi 1 each by Miscellaneous route nightly as needed. 1 each 0    needle, disp, 25 Jaramie (BD REGULAR  BEVEL NEEDLES) 25 Yuriy x 5/8 Ndle For subcutaneous hormone injection. 25 each 0    nifedipine 0.3% lidocaine 1.5% in petrolatum ointment Apply a pea-size amount to anal area 3 times a day 100 g 0    omalizumab (XOLAIR) 150 mg/mL syringe Inject 2 mL (300 mg total) under the skin every twenty-eight (28) days. 2 mL 12    propranolol (INDERAL) 40 MG tablet Take 1 tablet (40 mg total) by mouth four (4) times a day. (Patient not taking: Reported on 12/26/2022)      prucalopride 2 mg Tab Take 1 tablet (2 mg total) by mouth daily. (Patient not taking: Reported on 12/26/2022) 90 tablet 0    safety needles (BD ECLIPSE) 25 Arlester x 1 Ndle use for testosterone 12 each 0    safety needles (BD SAFETYGLIDE NEEDLE) 18 Ted x 1 1/2 Ndle For drawing hormone injection 25 each 0    syringe, disposable, (EASY TOUCH LUER LOCK SYRINGE) 1 mL Syrg Use for weekly hormone injection. 4 each 0    syringe, disposable, (EASY TOUCH LUER LOCK SYRINGE) 1 mL Syrg Use for testosterone 12 each 0    syringe, disposable, 2.5 mL Syrg Use weekly 30 Syringe 2    triamcinolone (KENALOG) 0.1 % cream Apply topically Two (2) times a day. 15 g 0     Current Facility-Administered Medications   Medication Dose Route Frequency Provider Last Rate Last Admin    omalizumab [name]) injection 300 mg  300 mg Subcutaneous Q28 Days [name], [name], [name]   300 mg at 05/24/22 1138       Allergies    Allergies   Allergen Reactions    Latex Rash and Hives    Penicillins Anaphylaxis     Pt can tolerate amoxicillin   Has patient had a PCN reaction causing immediate rash, facial/tongue/throat swelling, SOB or lightheadedness with hypotension: no   Has patient had a PCN reaction causing severe rash involving mucus membranes or skin necrosis: no  Has patient had a PCN reaction that required hospitalization: unknown  Has patient had a PCN reaction occurring within the last 10 years: no  If all of the above answers are NO, then may proceed with Cephalosporin use.    Shellfish Containing Products Anaphylaxis    Other Rash     Nifedipine 0.3%, Lidocaine 1.5% in petrolatum ointment caused rash on his Bottom    Latex, Natural Rubber Rash    Penicillin Rash       Family History    Negative for bleeding disorders or free bleeding.     family history includes Breast cancer in his maternal grandmother and sister; Cancer in his mother and sister; Cataracts in his maternal grandmother; Hypertension in his father; Ovarian cancer in his maternal grandmother; Thyroid disease in his mother.    Social History:     reports that he has never smoked. He has never used smokeless tobacco.   reports that he does not currently use alcohol.   reports that he does not currently use drugs after having used the following drugs: Marijuana.    Review of Systems    A 12 system review of systems was performed and is negative other than that noted in the history of present illness.    Vital Signs  Last menstrual period 07/20/2017, not currently breastfeeding.      Physical Exam  General: Well-developed, well-nourished. Appropriate, comfortable, and in no apparent distress.  Head/Face: On external examination there is no obvious asymmetry or  scars. On palpation there is no tenderness over maxillary sinuses or masses within the salivary glands. Cranial nerves V and VII are intact through all distributions.  Eyes: PERRL, EOMI, the conjunctiva are not injected and sclera is non-icteric.  Ears: On external exam, there is no obvious lesions or asymmetry. The EACs are bilaterally without cerumen or lesions. The TMs are in the neutral position and are mobile to pneumatic otoscopy bilaterally. There are no middle ear masses or fluid noted. Hearing is grossly intact bilaterally.  Nose: On external exam there are neither lesions nor asymmetry of the nasal tip/ dorsum. On anterior rhinoscopy, visualization posteriorly is limited on anterior examination. For this reason, to adequately evaluate posteriorly for masses, polypoid disease and/or signs of infections, nasal endoscopy is indicated (see procedure below).  Oral cavity/oropharynx: The mucosa of the lips, gums, hard and soft palate, posterior pharyngeal wall, tongue, floor of mouth, and buccal region are without masses or lesions and are normally hydrated. Good dentition. Tongue protrudes midline. Tonsils are normal appearing. Supraglottis not visualized due to gag reflex.  Neck: There is no asymmetry or masses. Trachea is midline. There is no enlargement of the thyroid or palpable thyroid nodules.   Lymphatics: There is no palpable lymphadenopathy along the jugulodiagastric, submental, or posterior cervical chains.  Chest: No audible wheeze, unlabored respirations.  Cardiovascular: Regular rate.  GI: Nondistended.  Neurologic: Cranial nerve???s II-XII are grossly intact. Exam is non-focal.  Extremities: No cyanosis, clubbing or edema.    Procedures:  Diagnostic Bilateral Nasal Endoscopy (CPT 2890990110)    NOTE: Nasal endoscopy is performed for the sinuses only, and not for examination of the skull base, septum or inferior turbinates, nor is it related to any previously performed septoplasty or inferior turbinate surgery or skull base surgery.     Surgeon: Egbert Garibaldi, MD  Anesthesia: none  Procedure Detail:  As a result of inability to visualize the intranasal anatomy, and after discussion of the potential risks related to the procedure (primarily bleeding), a endoscope is used to examine the left and right sinonasal cavities, including the interior of the nasal cavity and the middle and superior meatus, the turbinates, and the spheno-ethmoid recess. All these areas were inspected. Debris suctioned today.     Findings:    Turbinates are well reduced. Nasal passages are open and clear. MM and SER are clear bilaterally.   Mild NVC; positive cottle.     Oretha Ellis Nasal Endoscopy Score: [employer] is used to assess the degree of inflammation of the sinonasal structures, including the middle and superior turbinates, the ethmoid sinuses, maxillary sinuses, frontal sinuses, and sphenoid sinuses.  In the presence of previous surgery, some or all of these structures may be absent.    Left        Polyps:  Absent (0)   Edema:   Mild (1)   Discharge:  Clear, Thin (1)    Scarring:  Absent (0)   Crusting:  None (0)      Total Left:  2     Right         Polyps:  Absent (0)   Edema:  Mild (1)   Discharge: Clear, Thin (1)    Scarring:  Absent (0)   Crusting:  None (0)      Total Right:   2      Labs and Diagnostic Tests  CT Sinus 12/03/2020:  Septal deviation to the left with bilateral swell bodies. All sinuses appear  healthy. ITH.     Assessment:  The patient is a 42 y.o. adult who  has a past medical history of Anxiety, Asthma, Autoimmune autonomic neuropathy, Breast cyst, Financial difficulties, Lack of access to transportation, Migraines, Neuromuscular disorder (CMS-HCC), Scleroderma (CMS-HCC), and Visual impairment. who presents for the evaluation of:  Allergic Rhinitis  Septal Deviation  Nasal Airway Obstruction  Headache/Facial Pain  ITH (R)    Recommendations:  1. I suspect that his facial pain/pressure and headaches may be related to his untreated sleep apnea. Will refer to Dr. Gabriel Cirri for consideration of Inspire.   2. Also discussed that facial pain/pressure can be related to TMJ and can consider trying a mouth guard at night.   3. Mild Nasal valve collapse- can  try nasal cones a night   4. Atrovent nasal spray   5. Continue follow up with GI regarding reflux that is likely contributing to his PND.   6. Continue with referral to Rheumatology     Follow up in 3-4 months with audio.

## 2023-01-13 MED FILL — XOLAIR 150 MG/ML SUBCUTANEOUS SYRINGE: SUBCUTANEOUS | 28 days supply | Qty: 2 | Fill #1

## 2023-01-13 MED FILL — GABAPENTIN 250 MG/5 ML ORAL SOLUTION: ORAL | 30 days supply | Qty: 1080 | Fill #1

## 2023-01-16 NOTE — Unmapped (Signed)
Department of Anesthesiology  [facility]  [address]  [place], [place] [address]  [phone]    Chronic Pain Follow-Up Note  1. Myofascial pain    2. Chronic pain syndrome      Assessment and Plan  Vincent [facility] is a 42 y.o. 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 Gayla Doss in May 2023.  He was first seen by our clinic in March 2023 for diagnostic injections.  His range of motion has an increased postoperatively, but still has tight, spasming pain in the right neck and shoulder.  He is currently weaning off benzodiazepines, discontinued opioid medications in late 2023 which he was chronically on for 3 years, and inquires about optimizing medications and procedures to improve his baseline pain at this time.  His primary pain generator includes myofascial pain post-TOS decompression.  He has other medical history that includes anxiety, scleroderma, migraines, autoimmune autonomic neuropathy, asthma.  Also follows with sports medicine and has undergone injections there as well.      July 2024  At last visit in April, the patient presented reporting improvement in pain following serial TPIs in the past several months, along with continued PT. He noted that his pain was now associated with new nerve symptoms that extend from his right shoulder into his neck and behind his right eye. He associated these symptoms with migraines, though noted that typically his migraines started in the base of his skull or the crown of his head and radiated forward. He was interested in a nerve block as discussed with Dr. Oda Kilts, so we planned for right spinal accessory nerve block with ultrasound at his next procedure appointment on 5/17, which replaced his TPIs. He otherwise had great results from his treatment thus far and was hopeful that this was a minor setback in his pain and he would be able to continue working with PT to improve symptoms for the long-term. He continued to find gabapentin helpful without significant adverse effects.    Thoracic Outlet Syndrome - Myofascial pain - Chronic Pain Syndrome     Today, patient presents for post-procedure assessment. He underwent right spinal accessory nerve block with pulsed RFA on 12/29/22, which he endorses 70% on going relief. He denies new or worsening symptoms. In terms of medications, he takes gabapentin 600 mg TID with benefit without any adverse side effect.     - Continue following with PT  - Repeat TPIs PRN, not at the same time as the block  - Continue gabapentin 600 mg oral solution TID, refilled x2, one refill remaining   - Continue clonazepam taper with PCP    We are delivering comprehensive, continuous, longitudinal care for this patient with chronic pain.     Considerations for the future:  - Repeat TPIs PRN  -Repeat spinal accessory nerve block/pulsed RFA  - Oromaxillofacial pain referral if trigeminal neuralgia suspected/ deemed separate from TOS pain   - Consider initiation of Butrans/buprenorphine product after weaning off of benzodiazepines and pain psych evaluation for COM should conservative measures fail, but hoping to avoid if possible  - Consider up titration of gabapentin oral solution or rotation to Lyrica   - Hip MRI without contrast    Urine toxicology: 04/28/22  Urine toxicology screen was appropriate.   Last Opioid Change: Last Rx in 04/28/22  Last EKG: N/A  Previous Compliance Issues: None  Naloxone ordered: no  Total morphine equivalents: 0  Benzodiazepine: Yes. Weaning.  [place] was reviewed 01/16/23     Return in about 3 months (around 04/19/2023).    No orders of the defined types were placed in this encounter.    Requested Prescriptions     Signed Prescriptions Disp Refills    gabapentin (NEURONTIN) 250 mg/5 mL oral solution 1080 mL 1     Sig: Take 12 mL (600 mg total) by mouth Three (3) times a day.     HPI:  Vincent Waters is seen in consultation at the request of Dr. Gayla Doss for evaluation and recommendations regarding His pain.     Patient was last seen in April. Since then, the patient has followed with Ortho, PT, Vasc Surg, Fam Med and Urgent Care. The patient underwent right spinal accessory nerve block with pulsed RFA on 12/29/22 with our clinic.      Today, patient presents for post-procedure assessment. He underwent right spinal accessory nerve block with pulsed RFA on 12/29/22, which he endorses 70% on going relief. He denies new or worsening symptoms. In terms of medications, he takes gabapentin 600 mg TID with benefit without any adverse side effect.     Current Medications:  Gabapentin 600 mg TID- has been on for years.  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  Voltaren gel     Current view: Showing all answers           [facility] Return Patient Questionnaire       Question 01/17/2023  8:15 AM EDT - Ceasar Waters by Patient    What is the reason for your visit? Post procedure assessment    Date of onset of your pain: 11/16/2021    Please rate your pain at its WORST in the past month. 8    Please rate your pain at its LEAST in the past month. 3    Please rate your pain as it is RIGHT NOW. 5    Please rate your pain on AVERAGE in the past month. 5    Please circle the location of your pain.       Please select the words that describe your pain. Aching     Sharp     Sore     Stabbing Tender Throbbing    How often do you have pain? Sometimes    When is your pain the worst? Evenings    Which of the following have been negatively affected by your pain? General activity     Normal work     Recreational activities     Sleep    Since your last visit:     Have you had any of the following? Primary Care Visit     New pain medication prescribed    Do you have any new pain you would like to discuss with your doctor? Yes    How has your pain changed? Stayed the same    Are you currently taking any blood-thinners or anticoagulants? No    If you are on Pain Medication - Are you having any of the following? I am stable on my current medication regime     My medications help to improve my quality of live    If you have had a procedure since your last visit, how much pain relief was obtained? 60%    If you have had a procedure since your last visit, were there any complications?  General: Night Sweats     Fatigue     Difficulty Sleeping    Cardiovascular:       Gastrointestinal - (Intestinal): Gastric Reflux    Skin: Itching     Dryness    Endocrine (Hormonal System): Increased Sweating     Excess Thirst     Excess Urination    Musculoskeletal System - (Muscles, Joints and Coverings): Joint Aches/Swelling     Muscle Aches/Weakness    Neurologic: Dizziness/Vertigo    Psychiatric: Anxiety            Previous Medication Trials:  NSAIDS- ibuprofen  Antidepressants-   Anticonvulsant- gabapentin  Muscle relaxants- flexeril, baclofen  Topicals-voltaren gel  Short-acting opiates-percocet   Long-acting opiates-   Anxiolytics-   Other-     Previous Interventions  Medications, PT, surgery, injections with sports med  TOS decompression/PMR 11/16/21  TPI-05/10/22-very helpful.  Have repeated several times since  PT-2023  Left GTB CSI-sports med 05/27/22  Spinal accessory nerve block-11/04/22  Spinal accessory nerve block and pulsed RFA-12/29/22    Allergies as of 01/17/2023 - Reviewed 01/12/2023   Allergen Reaction Noted    Latex Rash and Hives 09/16/2016    Penicillins Anaphylaxis 09/16/2016    Shellfish containing products Anaphylaxis 01/23/2017    Other Rash 12/16/2020    Latex, natural rubber Rash 08/20/2015    Penicillin Rash 02/14/2018      Current Outpatient Medications   Medication Sig Dispense Refill    albuterol 2.5 mg /3 mL (0.083 %) nebulizer solution Inhale 3 mL (2.5 mg total) by nebulization every six (6) hours as needed for wheezing or shortness of breath. 90 mL 1 ascorbic acid, vitamin C, (VITAMIN C) 100 MG tablet Take 1 tablet (100 mg total) by mouth daily.      azelastine (ASTELIN) 137 mcg (0.1 %) nasal spray Instill 2 sprays into each nostril two (2) times a day as needed for rhinitis. 30 mL 1    budesonide-formoterol (SYMBICORT) 160-4.5 mcg/actuation inhaler Inhale 1 puff by mouth Two (2) times a day. 10.2 g 5    cetirizine (ZYRTEC) 10 MG tablet Take 1 tablet (10 mg total) by mouth daily. 30 tablet 12    clindamycin (CLEOCIN T) 1 % lotion 1 Application as needed.      clonazePAM (KLONOPIN) 0.5 MG tablet Take 1/2 tablet (0.25 mg total) by mouth once daily as needed for anxiety.  May take an additional 1/2 tab every other day as needed for 14 days. After 14 days, decrease to taking 1/2 tab once daily as needed, without the additional dose. 30 tablet 0    [START ON 01/27/2023] dextroamphetamine-amphetamine (ADDERALL) 20 mg tablet Take 1 tablet (20 mg total) by mouth as needed for up to 10 doses. 10 tablet 0    emtricitabine-tenofovir alafen (DESCOVY) 200-25 mg tablet Take 1 tablet by mouth daily. 90 tablet 0    EPINEPHrine (EPIPEN) 0.3 mg/0.3 mL injection Inject 0.3 mL (0.3 mg total) under the skin once for 1 dose. 2 each 0    famotidine (PEPCID) 40 MG tablet Take 1 tablet (40 mg total) by mouth every evening. 30 tablet 1    gabapentin (NEURONTIN) 250 mg/5 mL oral solution Take 12 mL (600 mg total) by mouth Three (3) times a day. 1080 mL 2    gabapentin (NEURONTIN) 250 mg/5 mL oral solution Take 12 mL (600 mg total) by mouth Three (3) times a day. 1080 mL 1    hydrOXYzine (ATARAX) 25 MG tablet Take  1 tablet (25 mg total) by mouth.      ibuprofen (MOTRIN) 800 MG tablet Take 1 tablet (800 mg total) by mouth every eight (8) hours as needed for pain. 60 tablet 2    inhalational spacing device Spcr Use as directed with albuterol and symbicort 1 each 1    ipratropium (ATROVENT) 42 mcg (0.06 %) nasal spray 1-2 sprays in each nostril as needed for drainage, up to 4 time daily. 15 mL 2    nebulizer and compressor (COMP-AIR NEBULIZER COMPRESSOR) Devi 1 each by Miscellaneous route nightly as needed. 1 each 0    needle, disp, 25 Rod (BD REGULAR BEVEL NEEDLES) 25 Kamil x 5/8 Ndle For subcutaneous hormone injection. 25 each 0    nifedipine 0.3% lidocaine 1.5% in petrolatum ointment Apply a pea-size amount to anal area 3 times a day 100 g 0    omalizumab (XOLAIR) 150 mg/mL syringe Inject 2 mL (300 mg total) under the skin every twenty-eight (28) days. 2 mL 12    propranolol (INDERAL) 40 MG tablet Take 1 tablet (40 mg total) by mouth four (4) times a day.      prucalopride 2 mg Tab Take 1 tablet (2 mg total) by mouth daily. 90 tablet 0    safety needles (BD ECLIPSE) 25 Sriansh x 1 Ndle use for testosterone 12 each 0    safety needles (BD SAFETYGLIDE NEEDLE) 18 Candido x 1 1/2 Ndle For drawing hormone injection 25 each 0    syringe, disposable, (EASY TOUCH LUER LOCK SYRINGE) 1 mL Syrg Use for weekly hormone injection. 4 each 0    syringe, disposable, (EASY TOUCH LUER LOCK SYRINGE) 1 mL Syrg Use for testosterone 12 each 0    syringe, disposable, 2.5 mL Syrg Use weekly 30 Syringe 2    triamcinolone (KENALOG) 0.1 % cream Apply topically Two (2) times a day. 15 g 0     Current Facility-Administered Medications   Medication Dose Route Frequency Provider Last Rate Last Admin    omalizumab [name]) injection 300 mg  300 mg Subcutaneous Q28 Days [name], [name], [name]   300 mg at 05/24/22 1138       Imaging/Tests:   MRI RLE 09/14/22  FINDINGS:        Bones: No fractures. No marrow edema or bone lesions.      Right hip: No subluxation. No effusion or intra-articular bodies.  No acetabular retroversion. Femoral head/neck offset is within normal limits.  No high-grade chondral defect. The lateral acetabular labrum appears irregular with questionable chondrolabral cleft.      Other Joints: The visualized lumbar spine, sacroiliac joints, symphysis pubis, and contralateral hip are unremarkable.      Musculature and tendons: No intramuscular edema or fatty atrophy.  Intact gluteus medius and minimus tendons. Intact hamstring, rectus femoris and iliopsoas tendons. Ischiofemoral spaces are within normal limits.      Pelvic Cavity: Intrapelvic contents are unremarkable.      Other: Visualized sciatic nerves are unremarkable.  No bursitis.  No subcutaneous mass or fluid collection.   IMPRESSION  -- Suspected right acetabular labral tear.  Consider MR arthrography for further evaluation.     MRI LLE 09/14/22  FINDINGS:     Evaluation of the bone marrow reveals normal marrow signal throughout the visualized bones of the pelvis, proximal femurs and lower lumbar spine.      The musculature of the pelvis is normal and symmetric.  Tendon origins are normal.  No evidence of bursitis.  The sacroiliac and hip joints are normal.      The sciatic nerves are normal.  Vasculature of the pelvis is normal.      Intrapelvic contents are normal.   IMPRESSION  Normal MRI of the left hip.       Lab Results   Component Value Date    PLT 315 12/26/2022     Lab Results   Component Value Date    CREATININE 1.10 (H) 12/26/2022     Lab Results   Component Value Date    ALKPHOS 112 12/26/2022    BILITOT 0.4 12/26/2022    PROT 7.1 12/26/2022    ALBUMIN 3.8 12/26/2022    ALT 10 12/26/2022    AST 20 12/26/2022     ROS:  See questionnaire     PHYSICAL EXAM:  BP 116/83  - Pulse 71  - Temp 36.1 ??C (97 ??F) (Skin)  - Resp 16  - Ht 162.6 cm (5' 4)  - Wt 76.3 kg (168 lb 3.2 oz)  - LMP 07/20/2017 (Exact Date)  - SpO2 94%  - BMI 28.87 kg/m??     Wt Readings from Last 3 Encounters:   01/24/23 75.2 kg (165 lb 12.8 oz)   01/19/23 75.1 kg (165 lb 9.6 oz)   01/17/23 76.3 kg (168 lb 3.2 oz)     GENERAL:  The patient is well developed, well-nourished, and appears to be in no apparent distress.   HEAD/NECK:    Normocephalic/atraumatic. clear sclera, pupils not pinpoint  CV:  Warm and well perfused.   LUNGS:   Normal work of breathing, no supplemental O2  EXTREMITIES:  No clubbing, cyanosis noted.  NEUROLOGIC:    The patient is alert and oriented, speech fluent, normal language.   MUSCULOSKELETAL:    Motor function  preserved.   GAIT:  The patient rises from a seated position with no difficulty and ambulates with nonantalgic gait without the assistance of a walking aid.   SKIN:   No obvious rashes, lesions, or erythema.  PSY:   Appropriate affect. No overt pain behaviors. No evidence of psychomotor retardation or agitation, no signs of intoxication.

## 2023-01-17 ENCOUNTER — Ambulatory Visit: Admit: 2023-01-17 | Discharge: 2023-01-18 | Payer: PRIVATE HEALTH INSURANCE

## 2023-01-17 DIAGNOSIS — G894 Chronic pain syndrome: Principal | ICD-10-CM

## 2023-01-17 DIAGNOSIS — M7918 Myalgia, other site: Principal | ICD-10-CM

## 2023-01-17 DIAGNOSIS — G540 Brachial plexus disorders: Principal | ICD-10-CM

## 2023-01-17 MED ORDER — GABAPENTIN 250 MG/5 ML ORAL SOLUTION
Freq: Three times a day (TID) | ORAL | 1 refills | 30 days | Status: CP
Start: 2023-01-17 — End: ?
  Filled 2023-03-24: qty 1080, 30d supply, fill #0

## 2023-01-17 NOTE — Unmapped (Signed)
It was good to see you today.    I have refilled your medications with no changes.    We will see you in 3 months, or sooner if needed.

## 2023-01-17 NOTE — Unmapped (Signed)
On line AVS and RXes reviewed.    Pt voiced understanding

## 2023-01-19 ENCOUNTER — Ambulatory Visit
Admit: 2023-01-19 | Discharge: 2023-01-20 | Payer: PRIVATE HEALTH INSURANCE | Attending: Student in an Organized Health Care Education/Training Program | Primary: Student in an Organized Health Care Education/Training Program

## 2023-01-19 DIAGNOSIS — R768 Other specified abnormal immunological findings in serum: Principal | ICD-10-CM

## 2023-01-19 LAB — CBC W/ AUTO DIFF
BASOPHILS ABSOLUTE COUNT: 0.1 10*9/L (ref 0.0–0.1)
BASOPHILS RELATIVE PERCENT: 1 %
EOSINOPHILS ABSOLUTE COUNT: 0 10*9/L (ref 0.0–0.5)
EOSINOPHILS RELATIVE PERCENT: 0.8 %
HEMATOCRIT: 45 % (ref 36.0–46.0)
HEMOGLOBIN: 14.9 g/dL (ref 12.1–15.7)
LYMPHOCYTES ABSOLUTE COUNT: 2 10*9/L (ref 1.1–3.6)
LYMPHOCYTES RELATIVE PERCENT: 35.6 %
MEAN CORPUSCULAR HEMOGLOBIN CONC: 33 g/dL (ref 32.0–36.0)
MEAN CORPUSCULAR HEMOGLOBIN: 26.5 pg (ref 25.9–32.4)
MEAN CORPUSCULAR VOLUME: 80.3 fL (ref 77.6–95.7)
MEAN PLATELET VOLUME: 7.6 fL (ref 6.8–10.7)
MONOCYTES ABSOLUTE COUNT: 0.3 10*9/L (ref 0.3–0.8)
MONOCYTES RELATIVE PERCENT: 5.2 %
NEUTROPHILS ABSOLUTE COUNT: 3.3 10*9/L (ref 1.8–7.8)
NEUTROPHILS RELATIVE PERCENT: 57.4 %
PLATELET COUNT: 316 10*9/L (ref 150–450)
RED BLOOD CELL COUNT: 5.61 10*12/L — ABNORMAL HIGH (ref 4.11–5.36)
RED CELL DISTRIBUTION WIDTH: 14.6 % (ref 12.2–15.2)
WBC ADJUSTED: 5.7 10*9/L (ref 3.6–11.2)

## 2023-01-19 LAB — HEPATIC FUNCTION PANEL
ALBUMIN: 4.3 g/dL (ref 3.4–5.0)
ALKALINE PHOSPHATASE: 110 U/L (ref 46–116)
ALT (SGPT): 10 U/L (ref 10–49)
AST (SGOT): 18 U/L (ref <=34)
BILIRUBIN DIRECT: 0.2 mg/dL (ref 0.00–0.30)
BILIRUBIN TOTAL: 0.5 mg/dL (ref 0.3–1.2)
PROTEIN TOTAL: 7.9 g/dL (ref 5.7–8.2)

## 2023-01-19 LAB — CREATININE
CREATININE: 0.91 mg/dL
EGFR CKD-EPI (2021) MALE: >90 mL/min/{1.73_m2} (ref >=60)

## 2023-01-19 LAB — SEDIMENTATION RATE: ERYTHROCYTE SEDIMENTATION RATE: 17 mm/h — ABNORMAL HIGH (ref 0–15)

## 2023-01-19 LAB — C-REACTIVE PROTEIN: C-REACTIVE PROTEIN: 7 mg/L (ref <=10.0)

## 2023-01-19 NOTE — Unmapped (Signed)
Rheumatology Clinic Follow-up Note     Assessment/Plan:    Vincent Waters is a 42 y.o.  transgender male adult with a PMHx of AR, asthma, chronic urticaria, thoracic outlet syndrome s/p pec minor release (10/2021), anxiety, migraines, gastroparesis, constipation, dysphagia who presents today for follow up of a +ANA. Last visit was 02/2021 with Dr. Marshall Cork who did not schedule regular follow up.    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 Feels he has having an episode today, while not febrile today will check labs as below. In both AoSD and auto-inflammatory diseases, I would expect very elevated inflammatory markers.  - CBC w/ diff, Cr, hepatic panel  - ESR, CRP    +ANA: He remains without an ANA associated rheumatic disease. Seronegative SjS was discussed before as well as discussion of a minor salivary gland biopsy. However, this is rare, he does not clearly have sicca to the expected degree with SjS, and has alternative potential etiologies to sicca symptoms (allergic disease, medications). Will discuss potential biopsy with ENT.    He is otherwise without evidence of an ANA associated rheumatic disease. MSK symptoms are non inflammatory. Has gastroparesis but without features of SSc such as raynaud's, sclerodactyly, SSc antibody, GAVE, etc.   - Would not recommend repeating ANA test    Widespread Pain: No evidence of an inflammatory arthritis by H&P today or by MR.    No follow-ups on file.    Fleeta Emmer, MD  Assistant Professor of Medicine  Department of Medicine/Division of Rheumatology  [facility]    Primary Care Provider: Linus Salmons, MD    HPI:  Vincent Waters is a 42 y.o.  adult with a PMHx of AR, asthma, chronic urticaria, thoracic outlet syndrome s/p pec minor release (10/2021), anxiety, migraines, gastroparesis, constipation, dysphagia who presents today for follow up of a +ANA. Last visit was 02/2021 with Dr. Marshall Cork who did not schedule regular follow up.    Today  Patient reports that he feels that he has had progressive joint pain and went to Dr. Arlyce Dice at his PCP's office. Says that he gets episodes where he comes down with something like a cold. Says these episodes come on with achiness, malaise, and sore throat. Feels his LN and feels fullness. Takes cold medicine, which makes him sleepy and he feels fine when he wakes up. Can last days to hours.     Review of Systems:  Positive findings noted above, otherwise a 14 point review of systems was reviewed and negative    Past Medical, Surgical, Family and Social History reviewed and updated per EMR     Allergies:  Latex; Penicillins; Shellfish containing products; Other; Latex, natural rubber; and Penicillin    Medications:     Current Outpatient Medications:     albuterol 2.5 mg /3 mL (0.083 %) nebulizer solution, Inhale 3 mL (2.5 mg total) by nebulization  every six (6) hours as needed for wheezing or shortness of breath., Disp: 90 mL, Rfl: 1    azelastine (ASTELIN) 137 mcg (0.1 %) nasal spray, Instill 2 sprays into each nostril two (2) times a day as needed for rhinitis., Disp: 30 mL, Rfl: 1    budesonide-formoterol (SYMBICORT) 160-4.5 mcg/actuation inhaler, Inhale 1 puff by mouth Two (2) times a day., Disp: 10.2 g, Rfl: 5    cetirizine (ZYRTEC) 10 MG tablet, Take 1 tablet (10 mg total) by mouth daily., Disp: 30 tablet, Rfl: 12    clindamycin (CLEOCIN T) 1 % lotion, Apply 1 Application topically as needed., Disp: 60 mL, Rfl: 0    clonazePAM (KLONOPIN) 0.5 MG tablet, Take 0.5 tablets (0.25 mg total) by mouth daily as needed for anxiety., Disp: 15 tablet, Rfl: 0    diclofenac sodium (VOLTAREN) 1 % gel, Apply 2 g topically four (4) times a day., Disp: 100 g, Rfl: 0    emtricitabine-tenofovir alafen (DESCOVY) 200-25 mg tablet, Take 1 tablet by mouth daily., Disp: 90 tablet, Rfl: 0    EPINEPHrine (EPIPEN) 0.3 mg/0.3 mL injection, Inject 0.3 mL (0.3 mg total) under the skin once for 1 dose., Disp: 2 each, Rfl: 0    famotidine (PEPCID) 40 MG tablet, Take 1 tablet (40 mg total) by mouth every evening., Disp: 30 tablet, Rfl: 1    gabapentin (NEURONTIN) 250 mg/5 mL oral solution, Take 12 mL (600 mg total) by mouth Three (3) times a day., Disp: 1080 mL, Rfl: 2    gabapentin (NEURONTIN) 250 mg/5 mL oral solution, Take 12 mL (600 mg total) by mouth Three (3) times a day., Disp: 1080 mL, Rfl: 1    inhalational spacing device Spcr, Use as directed with albuterol and symbicort, Disp: 1 each, Rfl: 1    ipratropium (ATROVENT) 42 mcg (0.06 %) nasal spray, 1-2 sprays in each nostril as needed for drainage, up to 4 time daily., Disp: 15 mL, Rfl: 2    nebulizer and compressor (COMP-AIR NEBULIZER COMPRESSOR) Devi, 1 each by Miscellaneous route nightly as needed., Disp: 1 each, Rfl: 0    needle, disp, 25 Courtez (BD REGULAR BEVEL NEEDLES) 25 Samael x 5/8 Ndle, For subcutaneous hormone injection., Disp: 25 each, Rfl: 0    nifedipine 0.3% lidocaine 1.5% in petrolatum ointment, Apply a pea-size amount to anal area 3 times a day, Disp: 100 g, Rfl: 0    omalizumab 300 mg/2 mL AtIn, Inject the contents of 1 pen (300 mg) under the skin every fourteen (14) days., Disp: 4 mL, Rfl: 11    omalizumab 75 mg/0.5 mL AtIn, Inject the contents of 1 pen (75 mg) under the skin every fourteen (14) days., Disp: 1 mL, Rfl: 11    pantoprazole (PROTONIX) 40 MG tablet, Take 1 tablet (40 mg total) by mouth daily., Disp: 90 tablet, Rfl: 3    prucalopride 2 mg Tab, Take 1 tablet (2 mg total) by mouth daily., Disp: 90 tablet, Rfl: 0    safety needles (BD ECLIPSE) 25 Deante x 1 Ndle, use for testosterone, Disp: 12 each, Rfl: 0    safety needles (BD SAFETYGLIDE NEEDLE) 18 Seng x 1 1/2 Ndle, For drawing hormone injection, Disp: 25 each, Rfl: 0    syringe, disposable, (EASY TOUCH LUER LOCK SYRINGE) 1 mL Syrg, Use for weekly hormone injection., Disp: 4 each, Rfl: 0    syringe, disposable, (EASY TOUCH LUER LOCK SYRINGE) 1 mL Syrg, Use for testosterone, Disp: 12 each, Rfl: 0  syringe, disposable, 2.5 mL Syrg, Use weekly, Disp: 30 Syringe, Rfl: 2    testosterone cypionate (DEPOTESTOTERONE CYPIONATE) 200 mg/mL injection, Inject 0.2 mL (40 mg total) into the muscle once a week., Disp: 5 mL, Rfl: 0    triamcinolone (KENALOG) 0.1 % cream, Apply topically Two (2) times a day., Disp: 15 g, Rfl: 0    Current Facility-Administered Medications:     omalizumab [name]) injection 300 mg, 300 mg, Subcutaneous, Q28 Days, [name], [name], [name], 300 mg at 05/24/22 1138      Objective   Vitals:    01/19/23 1451   BP: 123/76   Pulse: 76   Temp: 36.4 ??C (97.5 ??F)   TempSrc: Temporal   Weight: 75.1 kg (165 lb 9.6 oz)   Height: 162.6 cm (5' 4)       Physical Exam  General: well appearing, no acute distress  Eyes: EOMI, normal conjunctivae   ENT: MMM.  Oropharynx without any erythema or exudate.  No oral or nasal ulcers.  Neck: supple. No cervical lymphadenopathy  Cardiovascular: Regular rate and rhythm. Nl S1 and S2.  Pulmonary: Nl WOB on RA, CTAB.  Skin: no rash, lesions, breakdown. No purpura or petechiae. No digital ulcers.   Extremities: Warm and well perfused, no cyanosis, clubbing or edema  Musculoskeletal: No synovitis or tenderness to palpation in the hands, wrists, elbows, shoulders, knees, ankles, or feet. Full ROM throughout.   Neurologic: Cranial nerves grossly intact, strength 5/5 throughout.  Normal sensation  Psychiatric: Normal mood and affect.    I personally spent 42 minutes face-to-face and non-face-to-face in the care of this patient, which includes all pre, intra, and post visit time on the date of service.  All documented time was specific to the E/M visit and does not include any procedures that may have been performed.

## 2023-01-24 ENCOUNTER — Ambulatory Visit: Admit: 2023-01-24 | Discharge: 2023-01-25 | Payer: PRIVATE HEALTH INSURANCE

## 2023-01-24 DIAGNOSIS — F1320 Sedative, hypnotic or anxiolytic dependence, uncomplicated: Principal | ICD-10-CM

## 2023-01-24 DIAGNOSIS — Z7251 High risk heterosexual behavior: Principal | ICD-10-CM

## 2023-01-24 DIAGNOSIS — K3184 Gastroparesis: Principal | ICD-10-CM

## 2023-01-24 DIAGNOSIS — F902 Attention-deficit hyperactivity disorder, combined type: Principal | ICD-10-CM

## 2023-01-24 DIAGNOSIS — M25551 Pain in right hip: Principal | ICD-10-CM

## 2023-01-24 DIAGNOSIS — Z79899 Other long term (current) drug therapy: Principal | ICD-10-CM

## 2023-01-24 DIAGNOSIS — M25552 Pain in left hip: Principal | ICD-10-CM

## 2023-01-24 DIAGNOSIS — Z113 Encounter for screening for infections with a predominantly sexual mode of transmission: Principal | ICD-10-CM

## 2023-01-24 DIAGNOSIS — Z9189 Other specified personal risk factors, not elsewhere classified: Principal | ICD-10-CM

## 2023-01-24 MED ORDER — DOXYCYCLINE HYCLATE 100 MG TABLET
ORAL_TABLET | Freq: Every day | ORAL | 5 refills | 1 days | Status: CP
Start: 2023-01-24 — End: 2023-01-25
  Filled 2023-01-27: qty 2, 1d supply, fill #0

## 2023-01-24 MED ORDER — DICLOFENAC 1 % TOPICAL GEL
Freq: Four times a day (QID) | TOPICAL | 0 refills | 13 days | Status: CP
Start: 2023-01-24 — End: 2024-01-24

## 2023-01-24 NOTE — Unmapped (Signed)
High risk condomless encounters. On PrEP, most recent HIV test 10/2022 negative. Will obtain G/C today, and provide PRN Doxy to be taken 24-72 hrs after condomless encounter.

## 2023-01-24 NOTE — Unmapped (Addendum)
//   ADHD   On Adderall 20 mg every afternoon as needed currently and denies no currently reported side effects on the currently prescribed medication. Notices improvement in work performance, difficulty organizing tasks and activities, and talking excessively. PDMP reviewed consistent with reported use.   - Continue Immediate-release dextroamphetamine/amphetamine (Adderall) 20 mg PRN

## 2023-01-24 NOTE — Unmapped (Signed)
[facility]  Established Patient Clinic Note    Assessment/Plan:   Vincent Waters is a 42 y.o.adult    Problem List Items Addressed This Visit       Attention deficit hyperactivity disorder (ADHD), combined type     // ADHD   On Adderall 20 mg every afternoon as needed currently and denies no currently reported side effects on the currently prescribed medication. Notices improvement in work performance, difficulty organizing tasks and activities, and talking excessively. PDMP reviewed consistent with reported use.   - Continue Immediate-release dextroamphetamine/amphetamine (Adderall) 20 mg PRN           Relevant Medications    dextroamphetamine-amphetamine (ADDERALL) 20 mg tablet (Start on 01/27/2023)    Benzodiazepine dependence, continuous (CMS-HCC)     Chronic BZ  Historically utox within compliance. Last obtained urine within 6 mo in April 2024. Patient not on board for working towards wean at this time, reports being on medication for about 15 years and it is managing anxiety well for him.   - On Klonopin 0.5mg  tab w/ #30 tab refill for 30 days.   - Next fill provided  - next Utox due 03/2023         Relevant Medications    clonazePAM (KLONOPIN) 0.5 MG tablet (Start on 01/27/2023)    Nondiabetic gastroparesis     Pt reporting recent worsening of regurgitation secondary to Gastroparesis leading c/f aspiration risk. Jearl expresses concern for worsening strictures and prolonged time to meeting with GI. EGD from 08/13/19 reveals no evidence of stricture or esophagitis. Does reveal 3cm hiatal hernia with moderate amount of retained fluid. Discussed possible contribution of hiatal hernia to symptoms. Discussed aspiration risk and proceeding with SLP evaluation for possible barium swallow, clinical evaluation and treat. Anticipate repeat EGD for potential biopsy/treatment of hernia as deemed indicated.   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  Treatment options: Continuing Descovy with knowledge of adverse effects of weight gain, diarrhea, increased serum triglycerides. Of note, Quintan is interested in injectable agent: Vocabria, however we discussed this is currently not available at [facility] clinic.    Recommended Monitoring after initiation:  - HIV testing: next due 02/18/2023  - Signs or symptoms of HIV or STI: None  - Consider STI Testing: opted for G/C urine today, declines others  - Assess med adherence and tolerability: tolerating well  - Monitor CrCl q 51mo ( age > 50, baseline CrCl < 90): next due 07/22/2023  - Assess interest in continuing or discontinuing PrEP: desires to continue, no rx needed right now  - annual lipid panel for triglyceride monitoring for all pts taking descovy: next due 12/2023    For resources on Medication Access/Manufacturer's assistance: [email or web address]           High risk sexual behavior     High risk condomless encounters. On PrEP, most recent HIV test 10/2022 negative. Will obtain G/C today, and provide PRN Doxy to be taken 24-72 hrs after condomless encounter.  Other Visit Diagnoses       At risk for aspiration        Relevant Orders    Ambulatory referral to Speech Therapy    Screening examination for STI        Relevant Medications    doxycycline (VIBRA-TABS) 100 MG tablet    Other Relevant Orders    Chlamydia/Gonorrhoeae NAA                Attending: Dr. Marchia Bond    Subjective   Vincent Waters is a 42 y.o. adult  coming to clinic today for the following issues:    Chief Complaint   Patient presents with    Medication Refill    Medication Counseling     Discuss trulance     HPI:    Here as follow up since work on establishing with rheumatology for workup of positive ANA & c/f scleroderma given associated GI-manifestations.   Interval events:  7/16 - saw PT for b/l hip pain   7/25 - saw ENT, c/f untreated sleep apnea, referred for possible Inspire.   7/30 - saw pain mgmt, working on wean from BZ to bustrans/buprenorphine, anticipating hip MRI.  8/1 - saw Rheumatology who proposed idea of biopsy by ENT, also identify neurologist hasn't been followed up, with pinched nerve to be worked up.   Trulance - working on coverage. Recently had to use 3 enemas because going 2 days without bowel movement and has diarrhea spells, then goes back to normal then clogs back up. Two nights ago had a terrible episode of regurgitation where he was getting food contents stuck and required assistance from his neighbor to help dislodge contents. No miralax or senna, this causes abdominal distention. Trulance was started by GI physician to keep regular. Stomach can get backed up and reset itself. Kie is most concerned about this risk of aspiration and detriment to health.      I have reviewed the problem list, medications, and allergies and have updated/reconciled them if needed.    Vincent Waters  reports that he has never smoked. He has never been exposed to tobacco smoke. He has never used smokeless tobacco.  Health Maintenance   Topic Date Due    Influenza Vaccine (1) 02/19/2023    DTaP/Tdap/Td Vaccines (2 - Td or Tdap) 08/19/2027    Lipid Screening  12/26/2027    Hepatitis C Screen  Completed    COVID-19 Vaccine  Completed    Pneumococcal Vaccine 0-64  Aged Out       Objective     VITALS: BP 116/77 (BP Site: R Arm, BP Position: Sitting, BP Cuff Size: Large)  - Pulse 81  - Temp 36.6 ??C (97.8 ??F) (Temporal)  - Ht 162.6 cm (5' 4)  - Wt 75.2 kg (165 lb 12.8 oz)  - LMP 07/20/2017 (Exact Date)  - BMI 28.46 kg/m??     Physical Exam  Vitals reviewed.   Constitutional:       Appearance: Normal appearance. He is normal weight. HENT:      Head: Normocephalic and atraumatic.      Right Ear: External ear normal.      Left Ear: External ear normal.   Eyes:      Conjunctiva/sclera: Conjunctivae normal.   Cardiovascular:      Rate and Rhythm: Normal rate.   Pulmonary:      Effort: Pulmonary effort is normal.   Musculoskeletal:         General: Normal range  of motion.      Right lower leg: No edema.      Left lower leg: No edema.   Neurological:      General: No focal deficit present.      Mental Status: He is oriented to person, place, and time.   Psychiatric:         Mood and Affect: Mood normal.         Behavior: Behavior normal.         LABS/IMAGING  No visits with results within 3 Day(s) from this visit.   Latest known visit with results is:   Office Visit on 01/19/2023   Component Date Value    Creatinine 01/19/2023 0.91     eGFR CKD-EPI (2021) Male 01/19/2023 >90     eGFR CKD-EPI (2021) Fema* 01/19/2023 82     Albumin 01/19/2023 4.3     Total Protein 01/19/2023 7.9     Total Bilirubin 01/19/2023 0.5     Bilirubin, Direct 01/19/2023 0.20     AST 01/19/2023 18     ALT 01/19/2023 10     Alkaline Phosphatase 01/19/2023 110     Sed Rate 01/19/2023 17 (H)     CRP 01/19/2023 7.0     WBC 01/19/2023 5.7     RBC 01/19/2023 5.61 (H)     HGB 01/19/2023 14.9     HCT 01/19/2023 45.0     MCV 01/19/2023 80.3     MCH 01/19/2023 26.5     MCHC 01/19/2023 33.0     RDW 01/19/2023 14.6     MPV 01/19/2023 7.6     Platelet 01/19/2023 316     Neutrophils % 01/19/2023 57.4     Lymphocytes % 01/19/2023 35.6     Monocytes % 01/19/2023 5.2     Eosinophils % 01/19/2023 0.8     Basophils % 01/19/2023 1.0     Absolute Neutrophils 01/19/2023 3.3     Absolute Lymphocytes 01/19/2023 2.0     Absolute Monocytes 01/19/2023 0.3     Absolute Eosinophils 01/19/2023 0.0     Absolute Basophils 01/19/2023 0.1          Juanda Chance, MD  Family Medicine Resident PGY-3  [facility]  [place] [facility] [place] [facility]  [id number], [place], [place] [address]  Telephone [phone]  Fax [phone]  [email or web address]

## 2023-01-24 NOTE — Unmapped (Addendum)
Indications for continuing PrEP: Patient requests PrEP and has no contraindications.  Treatment options: Continuing Descovy with knowledge of adverse effects of weight gain, diarrhea, increased serum triglycerides. Of note, Nova is interested in injectable agent: Vocabria, however we discussed this is currently not available at [facility] clinic.    Recommended Monitoring after initiation:  - HIV testing: next due 02/18/2023  - Signs or symptoms of HIV or STI: None  - Consider STI Testing: opted for G/C urine today, declines others  - Assess med adherence and tolerability: tolerating well  - Monitor CrCl q 62mo ( age > 50, baseline CrCl < 90): next due 07/22/2023  - Assess interest in continuing or discontinuing PrEP: desires to continue, no rx needed right now  - annual lipid panel for triglyceride monitoring for all pts taking descovy: next due 12/2023    For resources on Medication Access/Manufacturer's assistance: [email or web address]

## 2023-01-24 NOTE — Unmapped (Addendum)
Pt reporting recent worsening of regurgitation secondary to Gastroparesis leading c/f aspiration risk. Vincent Waters expresses concern for worsening strictures and prolonged time to meeting with GI. EGD from 08/13/19 reveals no evidence of stricture or esophagitis. Does reveal 3cm hiatal hernia with moderate amount of retained fluid. Discussed possible contribution of hiatal hernia to symptoms. Discussed aspiration risk and proceeding with SLP evaluation for possible barium swallow, clinical evaluation and treat. Anticipate repeat EGD for potential biopsy/treatment of hernia as deemed indicated.   - follow up SLP eval  - encouraged follow up with GI for how to proceed with hiatal hernia  - follow up w/ this provider in 12mo

## 2023-01-24 NOTE — Unmapped (Addendum)
Chronic BZ  Historically utox within compliance. Last obtained urine within 6 mo in April 2024. Patient not on board for working towards wean at this time, reports being on medication for about 15 years and it is managing anxiety well for him.   - On Klonopin 0.5mg  tab w/ #30 tab refill for 30 days.   - Next fill provided  - next Utox due 03/2023

## 2023-01-25 NOTE — Unmapped (Signed)
Immediately after or during the visit, I reviewed with the resident the medical history and the resident’s findings on physical examination.  I discussed with the resident the patient’s diagnosis and concur with the treatment plan as documented in the resident note. Debi Cousin Louw Harshan Kearley, MD

## 2023-01-27 MED ORDER — DEXTROAMPHETAMINE-AMPHETAMINE 20 MG TABLET
ORAL_TABLET | Freq: Every day | ORAL | 0 refills | 10.00000 days | Status: CP | PRN
Start: 2023-01-27 — End: ?
  Filled 2023-01-27: qty 10, 10d supply, fill #0

## 2023-01-27 MED ORDER — CLONAZEPAM 0.5 MG TABLET
0 refills | 0 days | Status: CP
Start: 2023-01-27 — End: ?
  Filled 2023-01-27: qty 30, 30d supply, fill #0

## 2023-01-27 NOTE — Unmapped (Signed)
[facility] THERAPY SERVICES AT [facility] [place]  OUTPATIENT PHYSICAL THERAPY  01/27/2023  Note Type: Treatment Note       Patient Name: Vincent Waters  Date of Birth:October 14, 1980  Diagnosis:   Encounter Diagnoses   Name Primary?    Bilateral hip pain Yes    Weakness of left lower extremity      Referring MD:  Albesa Seen, MD     Date of Onset of Impairment-01/02/2021  Date PT Care Plan Established or Reviewed-01/03/2023  Date PT Treatment Started-01/03/2023   Plan of Care Effective Date:          Assessment/Plan:    Assessment  Assessment details:    It appears that Yoni has 2 nerve pinches going on: 1) Left L2/3 nerve pinch that's affecting the lateral femoral cutaneous nerve and causing his left low back and left thigh to go numb; and 2) Piriformis spasm that is pressing on his left sciatic nerve causing radiating pain into his posterior left lower extremity.  I suspect that his increased posture and contributed to the left L2/3 nerve pinch, which should improve with flexion based exercises.  Today, we focused on calming the left piriformis with manual therapy, followed by hip external rotator stretching and left lower extremity nerve mobilizations.  Afterwards, he stated he did feel a bit better.  Written instructions given for piriformis stretching and nerve mobilizations.  He committed to performing them consistently as part of his home exercise program.  All questions answered.  This patient requires skilled physical therapy services to address the outlined impairments in order to return to his desired level of function.           Impairments: impaired ADLs, impaired sensation, pain, trigger points, decreased strength, hypertonicity, muscular restrictions and postural weakness                      Therapy Goals      Goals:      Short Term Goals:  In 6 weeks:  Goal #1: Patient will be independent with progressively monitored home exercise program.   Baseline/Current: Patient is unfamiliar with home exercise program.     Goal #2: Patient will demonstrate normal palpation for decreased pain and proper biomechanical function.  Baseline/Current: Patient has spasmed left hip musculature.    Goal #3: Patient will report improved sleeping.  Baseline/Current: Patient has difficulty finding a comfortable position to sleep in.    Long Term Goals:  In 12 weeks:  Goal #1: Patient will demonstrate left hip strength within normal limits without pain in order to allow for functional tasks such as symmetrical walking, squatting and negotiating stairs.   Baseline/Current: Patient has painful and limited left hip strength (see manual muscle testing).      Goal #2: Patient will report minimal symptoms with weight bearing activities such as standing, walking and sit<>stand.  Baseline/Current: Patient has pain with these activities.    Goal #3: Patient will report centralization of lower extremity symptoms (pain, tingling, numbness and weakness).  Baseline/Current: Patient has pain and weakness that radiates into his left lower extremity.    Goal #4: Patient's FOTO score will improve from 26 at initial evaluation to >/= 54 at discharge in order to demonstrate a significant improvement in self-reported outcome measures that directly result from his course of physical therapy.   Baseline/Current: FOTO = 26    Plan    Therapy options: will be seen for skilled physical therapy services  Planned therapy interventions: 20560, 20561-Dry Needling 1-2, 3+ areas, 97110-Therapeutic Exercises, 97112-Neuromuscular Re-education, 97140-Manual Therapy and 97530-Therapeutic Activities      Frequency: 1x week    Duration in weeks: 12    Education provided to: patient.    Education provided: Anatomy, HEP, Symptom management and Treatment options and plan    Education results: needs reinforcement, verbalized good understanding, demonstrates understanding and needs further instruction.    Communication/Consultation: N/A.    Next visit plan: Flexion based strengthening for back (reduce lordosis), TDN/STM left ER's and Abductors, stretching, left extension hip strengthening    Total Session Time: 50    Treatment rendered today:      Therapeutic Exercise: 20 minutes  - Patient Education: Plan of Care, Home Exercise Program With Handout  - Supine Figure 4 Press/Pelvic Tilt: 10 x 10  - Supine Figure 4 Stretch: 10 x 10  - Supine Lower Extremity Nerve Mobilization: 2 x 10    Manual Therapy: 30 minutes  - Lumbar Mobilization in Side Lying  - Soft Tissue Mobilization left QL and lumbar paraspinals  - Soft Tissue Mobilization left hip external rotators  - Left external rotator MET  - PROM Hip ER    Plan details: Access Code: [email or web address]  URL: [email or web address]  Date: 01/27/2023  Prepared by: Christianne Dolin    Exercises  - Supine Double Knee to Chest  - 1 x daily - 7 x weekly - 1 sets - 20 reps - 5 hold  - Supine Posterior Pelvic Tilt  - 1 x daily - 7 x weekly - 1 sets - 20 reps - 5 hold  - Supine Figure 4 Piriformis Stretch (Mirrored)  - 1 x daily - 7 x weekly - 3 sets - 10 reps - 10 hold  - Supine Figure 4 Piriformis Stretch (Mirrored)  - 1 x daily - 7 x weekly - 1 sets - 10 reps - 10 hold  - Supine Sciatic Nerve Glide  - 1 x daily - 7 x weekly - 2 sets - 10 reps - 2-3 hold      Subjective:     History of Present Condition     Date of onset:  01/02/2021    Date is approximation?: yes    Explanation:  Patient reports symptoms started in 2022.    History of Present Condition/Chief Complaint:  [name] is a 42 y.o. right hand dominant adult who presents to outpatient physical therapy with bilateral hip pain that began in 2022.  Patient cannot remember any specific traumatic mechanism of injury that may have been the result of his symptoms.  He remembers that he woke up one day and his left hip started aching, and then the right one started hurting.  Left hurts more than the right.  Right hip clicks when he moves it.  He also has central low back pain.  Subjective:  Mr. Banaga reports that he had a lot of relief from the 2 exercises from the prior visit.  However, he's still in a lot of pain in his left posterior hip.  He continues to have left lowe back and left lateral thigh numbness. He is compliant with his Home Exercise Program.  Pain:     Current pain rating:  5  Location:  Left lateral/posterior hip, right angerior/groin/lateral    Quality:  Numbing, aching and throbbing    Progression:  No change      Objective:     Static Posture Assessment   Lumbar Spine-Increased lordosis.  Neurological Testing   Sensation  Hip  Left Hip  Diminished: light touch  Comments  Left light touch: Left lateral thigh.     Palpation   Left Hip   Muscle spasm in the piriformis.  Tenderness of the piriformis.  Trigger point to piriformis.  Tenderness   Left Hip   Tenderness in the greater trochanter.                                   I attest that I have reviewed the above information.  Signed: Patton Salles, PT  01/27/2023 3:54 PM

## 2023-01-30 DIAGNOSIS — Z113 Encounter for screening for infections with a predominantly sexual mode of transmission: Principal | ICD-10-CM

## 2023-01-30 MED ORDER — DOXYCYCLINE HYCLATE 100 MG TABLET
ORAL_TABLET | Freq: Every day | ORAL | 0 refills | 20 days | Status: CP
Start: 2023-01-30 — End: 2023-02-19

## 2023-01-30 NOTE — Unmapped (Signed)
Called the patient back and left a voicemail requesting a call back to discuss any questions he may have.

## 2023-02-03 ENCOUNTER — Ambulatory Visit
Admit: 2023-02-03 | Payer: PRIVATE HEALTH INSURANCE | Attending: Rehabilitative and Restorative Service Providers" | Primary: Rehabilitative and Restorative Service Providers"

## 2023-02-03 ENCOUNTER — Ambulatory Visit
Admit: 2023-02-03 | Discharge: 2023-03-01 | Payer: PRIVATE HEALTH INSURANCE | Attending: Rehabilitative and Restorative Service Providers" | Primary: Rehabilitative and Restorative Service Providers"

## 2023-02-03 NOTE — Unmapped (Signed)
[facility] [place]  OUTPATIENT PHYSICAL THERAPY  02/03/2023  Note Type: Treatment Note       Patient Name: Vincent Waters  Date of Birth:03-08-1981  Diagnosis:   Encounter Diagnoses   Name Primary?    Bilateral hip pain Yes    Weakness of left lower extremity      Referring MD:  Albesa Seen, MD     Date of Onset of Impairment-01/02/2021  Date PT Care Plan Established or Reviewed-01/03/2023  Date PT Treatment Started-01/03/2023   Plan of Care Effective Date:          Assessment/Plan:    Assessment  Assessment details:    I think we're on the right track with Lido.  Pain is coming down and his hip external rotator muscles are calming down.  Encouraged him to continue working on his stretches at home and go to the gym without over doing it.  We will start to shift into more strength work in upcoming sessions and he agrees with this plan.  All questions answered.  This patient requires skilled physical therapy services to address the outlined impairments in order to return to his desired level of function.           Impairments: impaired ADLs, impaired sensation, pain, trigger points, decreased strength, hypertonicity, muscular restrictions and postural weakness                      Therapy Goals      Goals:      Short Term Goals:  In 6 weeks:  Goal #1: Patient will be independent with progressively monitored home exercise program.   Baseline/Current: Patient is unfamiliar with home exercise program.     Goal #2: Patient will demonstrate normal palpation for decreased pain and proper biomechanical function.  Baseline/Current: Patient has spasmed left hip musculature.    Goal #3: Patient will report improved sleeping.  Baseline/Current: Patient has difficulty finding a comfortable position to sleep in.    Long Term Goals:  In 12 weeks:  Goal #1: Patient will demonstrate left hip strength within normal limits without pain in order to allow for functional tasks such as symmetrical walking, squatting and negotiating stairs.   Baseline/Current: Patient has painful and limited left hip strength (see manual muscle testing).      Goal #2: Patient will report minimal symptoms with weight bearing activities such as standing, walking and sit<>stand.  Baseline/Current: Patient has pain with these activities.    Goal #3: Patient will report centralization of lower extremity symptoms (pain, tingling, numbness and weakness).  Baseline/Current: Patient has pain and weakness that radiates into his left lower extremity.    Goal #4: Patient's FOTO score will improve from 26 at initial evaluation to >/= 54 at discharge in order to demonstrate a significant improvement in self-reported outcome measures that directly result from his course of physical therapy.   Baseline/Current: FOTO = 26    Plan    Therapy options: will be seen for skilled physical therapy services    Planned therapy interventions: 20560, 20561-Dry Needling 1-2, 3+ areas, 97110-Therapeutic Exercises, 97112-Neuromuscular Re-education, 97140-Manual Therapy and 97530-Therapeutic Activities      Frequency: 1x week    Duration in weeks: 12    Education provided to: patient.    Education provided: Anatomy, HEP, Symptom management and Treatment options and plan    Education results: needs reinforcement, verbalized good understanding, demonstrates understanding and needs further instruction.  Communication/Consultation: N/A.    Next visit plan:        Flexion based strengthening for back (reduce lordosis), TDN/STM left ER's and Abductors, stretching, left extension hip strengthening    Total Session Time: 35    Treatment rendered today:      Manual Therapy: 35 minutes  - Lumbar Mobilization in Side Lying  - Soft Tissue Mobilization left QL and lumbar paraspinals  - Soft Tissue Mobilization left hip external rotators  - Left external rotator MET  - PROM Hip ER  Intramuscular Therapy: 5 minutes  [facility]  was informed of the risks associated with dry needling and he signed consent form.  Patient screened for any contraindications to needling including pregnancy, infection, antibiotic use, anticoagulants use, autoimmune issues, uncontrolled diabetes, and active cancer/treatment and does not have any of these conditions.  DRY NEEDLING FOR RELEASING IDENTIFIED TRIGGER POINTS TO FACILITATE MANUAL THERAPY TECHNIQUES AND ALLOW MUSCLE TO RETURN TO OPTIMAL LENGTH (NO CHARGE).  Left piriformis trigger point with 60mm x .30mm with Flat Palpation/Direct Approach established for safety - Local Twitch Response produced     Plan details: Access Code: [email or web address]  URL: [email or web address]  Date: 01/27/2023  Prepared by: Christianne Dolin    Exercises  - Supine Double Knee to Chest  - 1 x daily - 7 x weekly - 1 sets - 20 reps - 5 hold  - Supine Posterior Pelvic Tilt  - 1 x daily - 7 x weekly - 1 sets - 20 reps - 5 hold  - Supine Figure 4 Piriformis Stretch (Mirrored)  - 1 x daily - 7 x weekly - 3 sets - 10 reps - 10 hold  - Supine Figure 4 Piriformis Stretch (Mirrored)  - 1 x daily - 7 x weekly - 1 sets - 10 reps - 10 hold  - Supine Sciatic Nerve Glide  - 1 x daily - 7 x weekly - 2 sets - 10 reps - 2-3 hold      Subjective:     History of Present Condition     Date of onset:  01/02/2021    Date is approximation?: yes    Explanation:  Patient reports symptoms started in 2022.    History of Present Condition/Chief Complaint:  Vincent Waters is a 42 y.o. right hand dominant adult who presents to outpatient physical therapy with bilateral hip pain that began in 2022.  Patient cannot remember any specific traumatic mechanism of injury that may have been the result of his symptoms.  He remembers that he woke up one day and his left hip started aching, and then the right one started hurting.  Left hurts more than the right.  Right hip clicks when he moves it.  He also has central low back pain.  Subjective:  Vincent Waters reports that he felt good after last session and he got relief for about 4 days.  It came back, but it didn't come back as intensely.  He is compliant with his Home Exercise Program.  Pain:     Current pain rating:  3  Location:  Left lateral/posterior hip, right angerior/groin/lateral    Quality:  Numbing, aching and throbbing    Progression:  No change      Objective:     Static Posture Assessment   Lumbar Spine-Increased lordosis.     Neurological Testing   Sensation  Hip  Left Hip  Diminished: light touch  Comments  Left light touch: Left lateral thigh.  Palpation   Left Hip   Muscle spasm in the piriformis.  Tenderness of the piriformis.  Trigger point to piriformis.  Tenderness   Left Hip   Tenderness in the greater trochanter.                                   I attest that I have reviewed the above information.  Signed: Patton Salles, PT  02/03/2023 3:51 PM

## 2023-02-10 NOTE — Unmapped (Signed)
The [facility] has made a second and final attempt to reach this patient to refill the following medication:XOLAIR 150 mg/mL syringe (omalizumab).      We have left voicemails on the following phone numbers: [phone], have sent a MyChart message, have sent a text message to the following phone numbers: [phone], and have sent a Mychart questionnaire..    Dates contacted: 08/12    08/23  Last scheduled delivery: 01/13/23    The patient may be at risk of non-compliance with this medication. The patient should call the [facility] at [phone]  Option 4, then Option 3: Allergy, Immunology, Pulmonary, Neurology to refill medication.    Nga Rabon' W Wilda Wetherell   [facility] Shared [facility] Specialty Technician

## 2023-02-10 NOTE — Unmapped (Signed)
[facility] Refill Coordination Note    C[facility] [place], [place]: 18-Nov-1980  Phone: There are no phone numbers on file.      All above HIPAA information was verified with patient.         02/10/2023    12:56 PM   Specialty Rx Medication Refill Questionnaire   Which Medications would you like refilled and shipped? Xolair   Please list all current allergies: Latex pen shellfish   Have you missed any doses in the last 30 days? No   Have you had any changes to your medication(s) since your last refill? No   How many days remaining of each medication do you have at home? None   If receiving an injectable medication, next injection date is 01/19/2023   Have you experienced any side effects in the last 30 days? No   Please enter the full address (street address, city, state, zip code) where you would like your medication(s) to be delivered to. [address], [place] [address]   Please specify on which day you would like your medication(s) to arrive. Note: if you need your medication(s) within 3 days, please call the pharmacy to schedule your order at [phone]  02/14/2023   Has your insurance changed since your last refill? No   Would you like a pharmacist to call you to discuss your medication(s)? No   Do you require a signature for your package? (Note: if we are billing Medicare Part B or your order contains a controlled substance, we will require a signature) No         Completed refill call assessment today to schedule patient's medication shipment from the [facility] [phone]).  All relevant notes have been reviewed.       Confirmed patient received a Conservation officer, historic buildings and a Surveyor, mining with first shipment. The patient will receive a drug information handout for each medication shipped and additional FDA Medication Guides as required.         REFERRAL TO PHARMACIST     Referral to the pharmacist: Not needed      [facility]     Shipping address confirmed in Epic.     Delivery Scheduled: Yes, Expected medication delivery date: 02/14/23.     Medication will be delivered via Same Day Courier to the prescription address in Epic WAM.    Davis Vannatter' [name] Shared [facility] Specialty Technician

## 2023-02-13 ENCOUNTER — Ambulatory Visit
Admit: 2023-02-13 | Discharge: 2023-02-14 | Payer: PRIVATE HEALTH INSURANCE | Attending: Student in an Organized Health Care Education/Training Program | Primary: Student in an Organized Health Care Education/Training Program

## 2023-02-13 DIAGNOSIS — J4550 Severe persistent asthma, uncomplicated: Principal | ICD-10-CM

## 2023-02-13 DIAGNOSIS — L508 Other urticaria: Principal | ICD-10-CM

## 2023-02-13 MED ORDER — OMALIZUMAB 300 MG/2 ML SUBCUTANEOUS AUTO-INJECTOR
SUBCUTANEOUS | 11 refills | 0 days | Status: CP
Start: 2023-02-13 — End: 2023-02-13
  Filled 2023-02-14: qty 1, 28d supply, fill #0

## 2023-02-13 MED ORDER — OMALIZUMAB 75 MG/0.5 ML SUBCUTANEOUS AUTO-INJECTOR
SUBCUTANEOUS | 11 refills | 0 days | Status: CP
Start: 2023-02-13 — End: ?

## 2023-02-13 NOTE — Unmapped (Signed)
Clinical Assessment Needed For: Dose Change  Medication: Xolair  Last Fill Date/Day Supply: 01/13/23 / 28  Prior Authorization Required  Was previous dose already scheduled to fill: Yes 8/27    Notes to Pharmacist: switching from syringes to pens as well

## 2023-02-13 NOTE — Unmapped (Signed)
Therapy Update Follow Up: No issues - Copay = $0

## 2023-02-13 NOTE — Unmapped (Signed)
We are sending prescription for increasing xolair dosing to help control your urticaria and asthma.    Please be checking My chart for notifications from the specialty pharmacy.

## 2023-02-13 NOTE — Unmapped (Signed)
[facility]    Chief complaint: follow up of hives, asthma, allergic rhinitis    Assessment and Plan:     Melanie [facility] [name]) is a 42 y.o. transgender male with anxiety and migraine headaches who was seen for follow-up visit for allergies, asthma, and chronic urticaria.    Allergic rhinoconjunctivitis, sensitized to dust mite and mold:   Symptoms are reasonably controlled at this time. Continue below regimen:  - Eye symptoms: Pataday 1 drop per eye daily   - Flonase sensimist 1 squirts in each nostril BID. Went over proper technique.   - Astelin 1-2 spray twice a day for additional control  - Daily nasal rinses prior to nasal sprays  - Xolair as below   - Continue cetirizine 10mg      Chronic urticaria, well controlled on Xolair:    CSU is characterized by recurrent, fleeting episodes of urticaria, with or without angioedema, on most days of the week. In the majority of cases, no external cause can be identified.   He has previously been on Xolair 300mg  Q28 days. Without missing doses, urticaria control has worsened, specifically at the 2 week mark. Plan to increase Xolair frequency for urticaria.  - See Xolair plan below under asthma  - Increase Zyrtec to 20mg  BID     Severe-persistent asthma, uncontrolled:   Asthma is not well controlled at this time. He is compliant with Symbicort 160-4.5 mcg 2 puffs BID but continues to have 3-4 times per week where he has shortness of breath requiring rescue medications. He has noticed prior improvement on Xolair that has now waned, which has been dosed according to CSU (300mg  Q28 days). Given his elevated total IgE, positive IgE to dust mite, and maximized inhaled corticosteroids, we will pursue adjusting Xolair dose based on his severe asthma.  - Increase Xolair to 375mg  Q14 days  - Continue Symbicort 160-4.5 2 puffs Bid  - Discontinue Symbicort for rescue and instead use albuterol PRN    Return in about 2 months (around 04/15/2023).    Total time spent on today's visit was over 40 minutes, including both face-to-face time and non face-to-face time.     The patient was discussed with Dr. [name] who agrees with the assessment and plan.    Delorse Limber, MD  [facility] Allergy/Immunology Fellow  ?     Subjective:     Hamsa [facility] [name]) is a 42 y.o. transgender male with anxiety and migraine headaches who was seen for follow-up visit for allergies, asthma, and chronic urticaria.    Interval history:     Rhinitis:   Continues to endorse frequent nasal congestion and post-nasal drip. He has been evaluated by ENT who felt there was a TMJ and OSA component. Tried TMJ mouthguard but this made mouth hurt. He is referred to Dr. Gabriel Cirri to talk about Earnest Bailey. Current regimen is nasal rinse (2x per day), Flonase and Astelin (2 sprays BID). Symptoms are improving some with nasal rinse.    Urticaria:  He is taking 300mg  Xolair Q28 days. He notices breakthrough symptoms start happening at 2 weeks and occur 3-4 days of the week. He is taking 10mg  Zyrtec BID. Symptoms are very bothersome and limiting his ability to work.     Asthma:    Symptoms: chest tightness, wheeze  No nocturnal cough, no oral steroids.  Triggered by allergies, cigarette smoke, regurgitation. Uses nebulizer to help break up phlegm.   Current medications: Symbicort doing 2 puffs twice per  day. 3-4 times per week for rescue.   He does not think Symbicort works well for him in emergent situations and prefers albuterol.  Endorses PCP has been giving him Symbicort that is not in his refill history.  Asthma control has improved on Xolair but now that Xolair seems to wear off earlier he notices asthma symptoms are worse when it wears off.    ROS: Pertinent positive and negatives included in the above HPI. All other systems reviewed are negative.    Past Medical/Surgical/Allergy/Immunization/Social/Family History:  Reviewed and updated in Epic since last visit on 09/2018.     Current Outpatient Medications   Medication Sig Dispense Refill    albuterol 2.5 mg /3 mL (0.083 %) nebulizer solution Inhale 3 mL (2.5 mg total) by nebulization every six (6) hours as needed for wheezing or shortness of breath. 90 mL 1    ascorbic acid, vitamin C, (VITAMIN C) 100 MG tablet Take 1 tablet (100 mg total) by mouth daily.      azelastine (ASTELIN) 137 mcg (0.1 %) nasal spray Instill 2 sprays into each nostril two (2) times a day as needed for rhinitis. 30 mL 1    budesonide-formoterol (SYMBICORT) 160-4.5 mcg/actuation inhaler Inhale 1 puff by mouth Two (2) times a day. 10.2 g 5    cetirizine (ZYRTEC) 10 MG tablet Take 1 tablet (10 mg total) by mouth daily. 30 tablet 12    clindamycin (CLEOCIN T) 1 % lotion 1 Application as needed.      clonazePAM (KLONOPIN) 0.5 MG tablet Take 1/2 tablet (0.25 mg total) by mouth once daily as needed for anxiety.  May take an additional 1/2 tab every other day as needed for 14 days. After 14 days, decrease to taking 1/2 tab once daily as needed, without the additional dose. 30 tablet 0    dextroamphetamine-amphetamine (ADDERALL) 20 mg tablet Take 1 tablet (20 mg total) by mouth as needed for up to 10 doses. 10 tablet 0    diclofenac sodium (VOLTAREN) 1 % gel Apply 2 g topically four (4) times a day. 100 g 0    doxycycline (VIBRA-TABS) 100 MG tablet Take 200 mg (2 tablets) within 24-72 hours of any condomless sexual encounter. 40 tablet 0    emtricitabine-tenofovir alafen (DESCOVY) 200-25 mg tablet Take 1 tablet by mouth daily. 90 tablet 0    famotidine (PEPCID) 40 MG tablet Take 1 tablet (40 mg total) by mouth every evening. 30 tablet 1    gabapentin (NEURONTIN) 250 mg/5 mL oral solution Take 12 mL (600 mg total) by mouth Three (3) times a day. 1080 mL 2    ibuprofen (MOTRIN) 800 MG tablet Take 1 tablet (800 mg total) by mouth every eight (8) hours as needed for pain. 60 tablet 2    inhalational spacing device Spcr Use as directed with albuterol and symbicort 1 each 1    ipratropium (ATROVENT) 42 mcg (0.06 %) nasal spray 1-2 sprays in each nostril as needed for drainage, up to 4 time daily. 15 mL 2    nebulizer and compressor (COMP-AIR NEBULIZER COMPRESSOR) Devi 1 each by Miscellaneous route nightly as needed. 1 each 0    needle, disp, 25 Claiborne (BD REGULAR BEVEL NEEDLES) 25 Alisha x 5/8 Ndle For subcutaneous hormone injection. 25 each 0    nifedipine 0.3% lidocaine 1.5% in petrolatum ointment Apply a pea-size amount to anal area 3 times a day 100 g 0    plecanatide (TRULANCE) 3 mg Tab Take 3 mg  by mouth daily as needed for up to 7 days. 30 tablet 0    propranolol (INDERAL) 40 MG tablet Take 1 tablet (40 mg total) by mouth four (4) times a day.      safety needles (BD ECLIPSE) 25 Yeshua x 1 Ndle use for testosterone 12 each 0    safety needles (BD SAFETYGLIDE NEEDLE) 18 Weylin x 1 1/2 Ndle For drawing hormone injection 25 each 0    syringe, disposable, (EASY TOUCH LUER LOCK SYRINGE) 1 mL Syrg Use for weekly hormone injection. 4 each 0    syringe, disposable, (EASY TOUCH LUER LOCK SYRINGE) 1 mL Syrg Use for testosterone 12 each 0    syringe, disposable, 2.5 mL Syrg Use weekly 30 Syringe 2    triamcinolone (KENALOG) 0.1 % cream Apply topically Two (2) times a day. 15 g 0    EPINEPHrine (EPIPEN) 0.3 mg/0.3 mL injection Inject 0.3 mL (0.3 mg total) under the skin once for 1 dose. 2 each 0    gabapentin (NEURONTIN) 250 mg/5 mL oral solution Take 12 mL (600 mg total) by mouth Three (3) times a day. 1080 mL 1    hydrOXYzine (ATARAX) 25 MG tablet Take 1 tablet (25 mg total) by mouth. (Patient not taking: Reported on 02/13/2023)      omalizumab 300 mg/2 mL AtIn Inject the contents of 1 pen (300 mg) under the skin every fourteen (14) days. 4 mL 11    omalizumab 75 mg/0.5 mL AtIn Inject the contents of 1 pen (75 mg) under the skin every fourteen (14) days. 1 mL 11    prucalopride 2 mg Tab Take 1 tablet (2 mg total) by mouth daily. (Patient not taking: Reported on 02/13/2023) 90 tablet 0     Current Facility-Administered Medications   Medication Dose Route Frequency Provider Last Rate Last Admin    omalizumab [name]) injection 300 mg  300 mg Subcutaneous Q28 Days [name], [name], [name]   300 mg at 05/24/22 1138       Melford lives with his girlfriend in [place]. He works at [employer] but is currently on medical leave.    Objective:     Laboratory testing reviewed and pertinent for the following:  Component      Latest Ref Rng & Units 07/16/2018 08/15/2018   Cocklebur IgE      <0.35 kUA/L <0.35    Cat dander IgE      <0.35 kUA/L <0.35    C[place] (White Poplar) Tree IgE      <0.35 kUA/L <0.35    Dog Dander IgE      <0.35 kUA/L <0.35    D. farinae IgE      <0.35 kUA/L 0.66 (H)    D. pteronyssinus IgE      <0.35 kUA/L 0.55 (H)    Bahia Grass IgE      <0.35 kUA/L <0.35    Johnson Grass IgE      <0.35 kUA/L <0.35    Timothy Grass IgE      <0.35 kUA/L <0.35    Alternaria alternata IgE      <0.35 kUA/L 0.39 (H)    Candida Albicans IgE      <0.35 kUA/L 4.49 (H)    Cladosporium Herbarum IgE      <0.35 kUA/L <0.35    Epicoccum purpurascens IgE      <0.35 kUA/L <0.35    Fusarium Proliferatum IgE      <0.35 kUA/L <0.35    Aspergillus fumigatus  IgE      <0.35 kUA/L <0.35    Mucor Racemosus IgE      <0.35 kUA/L <0.35    Aspergillus [place]bourg IgE      <0.35 kUA/L <0.35    Mouse IgE      <0.35 kUA/L <0.35    P chrysogenum (P notatum) IgE      <0.35 kUA/L <0.35    Rhizopus Nigrans      <0.35 kUA/L <0.35    Trichophyton rubrum IgE      <0.35 kUA/L <0.35    Setomelanomma rostrata (H. halodes) IgE      <0.35 kUA/L <0.35    Mugwort IgE      <0.35 kUA/L <0.35    Pigweed, Common IgE      <0.35 kUA/L <0.35    English Plantain IgE      <0.35 kUA/L <0.35    Giant Ragweed IgE      <0.35 kUA/L <0.35    German Cockroach IgE      <0.35 kUA/L <0.35    Sheep Sorrel      <0.35 kUA/L <0.35    Ragweed, short (common) IgE      <0.35 kUA/L <0.35    White Ash Tree IgE      <0.35 kUA/L <0.35    Beech (American) tree IgE      <0.35 kUA/L <0.35 Birch (Common Silver) tree IgE      <0.35 kUA/L <0.35    Box Elder Tree IgE      <0.35 kUA/L <0.35    Elm Tree IgE      <0.35 kUA/L <0.35    Maple Leaf Sycamore      <0.35 kUA/L <0.35    White Oak Tree IgE      <0.35 kUA/L <0.35    Pecan Hickory IgE      <0.35 kUA/L <0.35    Walnut Tree IgE      <0.35 kUA/L <0.35    [place] Grass IgE      <0.35 kUA/L <0.35    Goosefoot (Lamb's Quarters) IgE      <0.35 kUA/L <0.35    Willow tree IgE      <0.35 kUA/L <0.35    WBC      4.5 - 11.0 10*9/L 5.1 7.7   RBC      4.00 - 5.20 10*12/L 5.74 (H) 5.98 (H)   HGB      12.0 - 16.0 g/dL 16.1 09.6   HCT      04.5 - 46.0 % 47.1 (H) 48.9 (H)   MCV      80.0 - 100.0 fL 82.1 81.8   MCH      26.0 - 34.0 pg 25.8 (L) 25.8 (L)   MCHC      31.0 - 37.0 g/dL 40.9 81.1   RDW      91.4 - 15.0 % 13.9 14.0   MPV      7.0 - 10.0 fL 6.4 (L) 7.9   Platelet      150 - 440 10*9/L 345 383   Neutrophils %      % 69.2 74.5   Lymphocytes %      % 22.3 18.6   Monocytes %      % 4.8 4.6   Eosinophils %      % 1.3 0.2   Basophils %      % 0.7 0.3   Absolute Neutrophils      2.0 - 7.5 10*9/L 3.6  5.7   Absolute Lymphocytes      1.5 - 5.0 10*9/L 1.1 (L) 1.4 (L)   Absolute Monocytes       0.2 - 0.8 10*9/L 0.2 0.4   Absolute Eosinophils      0.0 - 0.4 10*9/L 0.1 0.0   Absolute Basophils       0.0 - 0.1 10*9/L 0.0 0.0   Large Unstained Cells      0 - 4 % 2 2   Hypochromasia      Not Present  Slight (A)   CD3% (T Cells)      61 - 86 %OfLymphs 74    Absolute CD3 Count      915-3,400 Cells/uL 889 (L)    CD4% (T Helper)      34 - 58 %OfLymphs 44    Absolute CD4 Count      510-2,320 Cells/uL 529    CD8% T Suppressor      12 - 38 %OfLymphs 27    Absolute CD8 Count      180-1,520 Cells/uL 325    CD4:CD8 Ratio      0.9 - 4.8 1.6    CD19% (B Cells)      7 - 23 %OfLymphs 11    Absolute CD19 Count      105 - 920 Cells/uL 132    CD16/56% NK Cell      1 - 27 %OfLymphs 13    Absolute CD16/56      15-1,080 Cells/uL 156    Diphtheria IgG Av Value      IU/mL 0.95    Tetanus IgG Value      IU/mL >2.24    Diphtheria IgG Ab       Positive    Tetanus IgG Ab       Positive    Sed Rate      0 - 20 mm/h 4 1   IgM      35 - 290 mg/dL 846    Total IgG      962-9,528 mg/dL 4,132    IgE, Total      2-214 IU/mL IU/mL 420 (H)    IgA      40.0 - 400.0 mg/dL 440.1    CRP      <02.7 mg/L  <5.0

## 2023-02-14 MED FILL — XOLAIR 300 MG/2 ML SUBCUTANEOUS AUTO-INJECTOR: SUBCUTANEOUS | 28 days supply | Qty: 4 | Fill #0

## 2023-02-14 NOTE — Unmapped (Signed)
[facility] Clinical Intervention    Type of intervention: Prescription clarification    Medication involved: Xolair    Problem identified: Vincent Waters states he felt Xolair was wearing off at week 2 mark due to experiencing increased hives. After his discussion with allergist, the [facility] received a new prescription for increased Xolair dose.    Intervention performed: We discussed dose increased to 375mg  every 14 days (based on asthma indication) which will be 2 injections (300mg  + 75mg ) every 2 weeks. We also discussed injection administration differences between Xolair prefilled syringe and the autoinjector pen (see counseling points below). All questions were answered.    Follow-up needed: Will follow-up next month before next refill is due    Approximate time spent: 10-15 minutes    Clinical evidence used to support intervention: Drug information resource    Result of the intervention: Improved therapy effectiveness    Oliva Bustard, PharmD   [facility] Specialty Pharmacist      Xolair Auto-injector Pen:  Before you use Xolair autoinjector for the first time, make sure your healthcare provider shows you the right way to use it  Gather supplies - Xolair autoinjector, alcohol swab, sterile cotton ball or gauze, small bandage and sharps disposal container   Look at the medication label - look for correct medication, correct dose and check the expiration date. Take note whether your dose requires more than 1 injection   Take the carton out of the refrigerator then lay on the flat surface and allow it to warm up to room temperature for at least 30-45 minutes   Prepare injection site - wash your hands with soap and water and remove the autoinjector out of the cartoon by holding the middle part, being sure not turn the carton upside down, hold the autoinjector by the cap or remove the cap before you are ready to inject    Look at the medication - the liquid in the viewing window should appear clear and colorless to pale brownish-yellow  Choose where to inject - if you are giving yourself the injection: front of the thigh or stomach area (abdomen); if a caregiver is giving the injection: additional option of the outer area of the upper arm. Do not inject within 2-inches directly around your belly button   Clean the injection site - wipe site with alcohol swab in a circular motion and let it dry for 10 seconds, do not touch injection site again before giving injection   Remove the cap - pull cap straight off and throw away in a regular household trash. Do not clean or touch the needle guard   Give the injection - hold the autoinjector at a 90-degree angle and press straight down while holding the autoinjector firmly against the skin   Monitor the injection - keep the autoinjector in the skin when you hear the 1st click (indicates the injection has started) and monitor by watching the green indictor within the viewing window   Complete injection - listen for the 2nd click (indicates the injection is almost complete) and continue holding the autoinjector in the same position until the green indicator has stopped moving and completing fills the viewing window   Remove - lift the autoinjector straight from the skin and the needle guard will automatically extend and lock over the needle   Disposal - place the used autoinjectors in an FDA sharps disposal or use an appropriate household container (e.g closed and  puncture resistant)   Care for the injection - if you see any blood at the injection site, press a cotton ball or gauze on the site and maintain pressure until the bleeding stops, do not rub the injection site  If your prescribed dose requires more than 1 injection - repeat the steps above for the next injection using a new autoinjector, make sure each injection is at least 1 inch apart from each other

## 2023-02-21 NOTE — Unmapped (Signed)
[facility]  OUTPATIENT PHYSICAL THERAPY  02/21/2023  Note Type: Treatment Note       Patient Name: Vincent Waters  Date of Birth:12-19-1980  Diagnosis:   Encounter Diagnoses   Name Primary?    Bilateral hip pain Yes    Weakness of left lower extremity      Referring MD:  Dawna Part, *     Date of Onset of Impairment-01/02/2021  Date PT Care Plan Established or Reviewed-01/03/2023  Date PT Treatment Started-01/03/2023   Plan of Care Effective Date:          Assessment/Plan:    Assessment  Assessment details:    Vincent Waters is doing great.  Pain is coming down, muscles are less spasmed and his hip ROM is improving.  Recommended that he speak with his trainer about his hip extension strength and he agreed to do this.  All questions answered.  This patient requires skilled physical therapy services to address the outlined impairments in order to return to his desired level of function.           Impairments: impaired ADLs, impaired sensation, pain, trigger points, decreased strength, hypertonicity, muscular restrictions and postural weakness                      Therapy Goals      Goals:      Short Term Goals:  In 6 weeks:  Goal #1: Patient will be independent with progressively monitored home exercise program.   Baseline/Current: Patient is unfamiliar with home exercise program.     Goal #2: Patient will demonstrate normal palpation for decreased pain and proper biomechanical function.  Baseline/Current: Patient has spasmed left hip musculature.    Goal #3: Patient will report improved sleeping.  Baseline/Current: Patient has difficulty finding a comfortable position to sleep in.    Long Term Goals:  In 12 weeks:  Goal #1: Patient will demonstrate left hip strength within normal limits without pain in order to allow for functional tasks such as symmetrical walking, squatting and negotiating stairs.   Baseline/Current: Patient has painful and limited left hip strength (see manual muscle testing).      Goal #2: Patient will report minimal symptoms with weight bearing activities such as standing, walking and sit<>stand.  Baseline/Current: Patient has pain with these activities.    Goal #3: Patient will report centralization of lower extremity symptoms (pain, tingling, numbness and weakness).  Baseline/Current: Patient has pain and weakness that radiates into his left lower extremity.    Goal #4: Patient's FOTO score will improve from 26 at initial evaluation to >/= 54 at discharge in order to demonstrate a significant improvement in self-reported outcome measures that directly result from his course of physical therapy.   Baseline/Current: FOTO = 26    Plan    Therapy options: will be seen for skilled physical therapy services    Planned therapy interventions: 20560, 20561-Dry Needling 1-2, 3+ areas, 97110-Therapeutic Exercises, 97112-Neuromuscular Re-education, 97140-Manual Therapy and 97530-Therapeutic Activities      Frequency: 1x week    Duration in weeks: 12    Education provided to: patient.    Education provided: Anatomy, HEP, Symptom management and Treatment options and plan    Education results: needs reinforcement, verbalized good understanding, demonstrates understanding and needs further instruction.    Communication/Consultation: N/A.    Next visit plan:        Flexion based strengthening for back (reduce lordosis), TDN/STM  left ER's and Abductors, stretching, left extension hip strengthening    Total Session Time: 35    Treatment rendered today:      Manual Therapy: 35 minutes  - Lumbar Mobilization in Side Lying  - Soft Tissue Mobilization left QL and lumbar paraspinals  - Soft Tissue Mobilization left hip external rotators  - Left external rotator MET  - PROM Hip ER  Intramuscular Therapy: 5 minutes  Vincent Waters  was informed of the risks associated with dry needling and he signed consent form.  Patient screened for any contraindications to needling including pregnancy, infection, antibiotic use, anticoagulants use, autoimmune issues, uncontrolled diabetes, and active cancer/treatment and does not have any of these conditions.  DRY NEEDLING FOR RELEASING IDENTIFIED TRIGGER POINTS TO FACILITATE MANUAL THERAPY TECHNIQUES AND ALLOW MUSCLE TO RETURN TO OPTIMAL LENGTH (NO CHARGE).  Left piriformis trigger point with 60mm x .30mm with Flat Palpation/Direct Approach established for safety - Local Twitch Response produced     Plan details: Access Code: [email or web address]  URL: [email or web address]  Date: 01/27/2023  Prepared by: Vincent Waters    Exercises  - Supine Double Knee to Chest  - 1 x daily - 7 x weekly - 1 sets - 20 reps - 5 hold  - Supine Posterior Pelvic Tilt  - 1 x daily - 7 x weekly - 1 sets - 20 reps - 5 hold  - Supine Figure 4 Piriformis Stretch (Mirrored)  - 1 x daily - 7 x weekly - 3 sets - 10 reps - 10 hold  - Supine Figure 4 Piriformis Stretch (Mirrored)  - 1 x daily - 7 x weekly - 1 sets - 10 reps - 10 hold  - Supine Sciatic Nerve Glide  - 1 x daily - 7 x weekly - 2 sets - 10 reps - 2-3 hold      Subjective:     History of Present Condition     Date of onset:  01/02/2021    Date is approximation?: yes    Explanation:  Patient reports symptoms started in 2022.    History of Present Condition/Chief Complaint:  Vincent Waters is a 42 y.o. right hand dominant adult who presents to outpatient physical therapy with bilateral hip pain that began in 2022.  Patient cannot remember any specific traumatic mechanism of injury that may have been the result of his symptoms.  He remembers that he woke up one day and his left hip started aching, and then the right one started hurting.  Left hurts more than the right.  Right hip clicks when he moves it.  He also has central low back pain.  Subjective:  Vincent Waters reports that he has been feeling better.  He has been going to the gym and working out with a Psychologist, educational.  He is compliant with his Home Exercise Program.  Pain: Current pain rating:  3  Location:  Left lateral/posterior hip, right angerior/groin/lateral    Quality:  Numbing, aching and throbbing    Progression:  No change      Objective:     Static Posture Assessment   Lumbar Spine-Increased lordosis.     Palpation   Left Hip   Tenderness of the piriformis.  Trigger point to piriformis.                                  I attest that I have reviewed the above information.  Signed: Patton Waters, PT  02/21/2023 1:56 PM

## 2023-02-22 ENCOUNTER — Ambulatory Visit
Admit: 2023-02-22 | Discharge: 2023-02-23 | Payer: PRIVATE HEALTH INSURANCE | Attending: Student in an Organized Health Care Education/Training Program | Primary: Student in an Organized Health Care Education/Training Program

## 2023-02-22 DIAGNOSIS — R12 Heartburn: Principal | ICD-10-CM

## 2023-02-22 DIAGNOSIS — K5909 Other constipation: Principal | ICD-10-CM

## 2023-02-22 DIAGNOSIS — R1110 Vomiting, unspecified: Principal | ICD-10-CM

## 2023-02-22 DIAGNOSIS — R1011 Right upper quadrant pain: Principal | ICD-10-CM

## 2023-02-22 DIAGNOSIS — K3184 Gastroparesis: Principal | ICD-10-CM

## 2023-02-22 MED ORDER — OMEPRAZOLE 40 MG CAPSULE,DELAYED RELEASE
ORAL_CAPSULE | Freq: Every day | ORAL | 3 refills | 90 days | Status: CP
Start: 2023-02-22 — End: 2024-02-22

## 2023-02-22 NOTE — Unmapped (Signed)
[facility] [place]  [address]  [place] [address]      ESTABLISHED CONSULTATION CLINIC VISIT    REFERRING PROVIDER: Soyla Murphy, MD  [address]  [place],  [place] [address]    PRIMARY CARE PROVIDER: Juanda Chance, MD    ASSESSMENT AND PLAN:     Vincent Waters is a [age] y.o. adult established patient with past medical history significant for gastroparesis, constipation, dysphagia, throracic outlet syndrome, prior cholecystectomy, ADHD presenting for follow up of constipation and regurgitation.    RUQ pain  Regurgitation of food  Heartburn  Gastroparesis  Ongoing regurgitation, heartburn. Symptoms are worse at night. Previously was diagnosed with gastroparesis though was on narcotics at that time. He has been off narcotics for a year. Taking benzos prn. Will repeat EGD and GES now that he is off narcotics, with plans to start a prokinetic if abnormal.    Prior workup:  AXR 04/2022: No significant stool burden  Has had 2 EGDs, most recently 07/2019 with 3cm hiatal hernia and significant gastroparesis with retained food, examination c/b laryngospasm.   GES in 2019 with 22% retention at 4 hours (unclear if he was on narcotics at that time)  Colonoscopy in 2017 was normal.   TSH and A1c normal.   He has elevated ANA 1:640 and was evaluated by rheumatology without concern for systemic connective tissue disease.     - Upper Endoscopy; Future  - NM Gastric Emptying; Future  - Start prilosec 40mg  daily    Chronic constipation  On Trulance prn. Has diarrhea when he takes it daily. Doing well on prn dose. Consider prucalopride if he has evidence of gastroparesis off narcotics.    Return in about 3 months (around 05/24/2023).    [name], MD  Clinical Assistant Professor of Medicine  [employer] [facility]  Division of Gastroenterology & Hepatology     SUBJECTIVE:     CHIEF COMPLAINT: Regurgitation    HISTORY OF PRESENT ILLNESS:  Vincent y.o. adult new patient with past medical history significant for gastroparesis, constipation, dysphagia, throracic outlet syndrome, prior cholecystectomy, ADHD presenting for follow up of constipation and regurgitation.        He is currently having regurgitation at night. He is following with ENT, there is a plan to have a sleep study done. He is completely off the narcotics since September. He is on benzos prn. Regurgitation happens only at night. He is on pepcid. Not on PPI. In regards to constipation, he is on trulance and has multiple BM, even when he takes it 3 times per week. He sometimes has fecal incontinence while on trulance.He is off trulance currently and feels that he is having regular BM on his own.    REVIEW OF SYSTEMS:   The balance of 12 systems reviewed is negative except as noted in the history of present illness.    PROVIDER REVIEWED WITH PATIENT AND UPDATED IN EMR:        OBJECTIVE:   VITAL SIGNS: BP 109/74 (BP Site: L Arm, BP Position: Sitting, BP Cuff Size: Medium)  - Pulse 85  - Temp 36.9 ??C (98.4 ??F) (Temporal)  - Ht 162.6 cm (5' 4)  - Wt 75.2 kg (165 lb 12.8 oz)  - LMP 07/20/2017 (Exact Date)  - BMI 28.46 kg/m??     Wt Readings from Last 5 Encounters:   02/22/23 75.2 kg (  165 lb 12.8 oz)   02/13/23 75.7 kg (166 lb 12.8 oz)   01/24/23 75.2 kg (165 lb 12.8 oz)   01/19/23 75.1 kg (165 lb 9.6 oz)   01/17/23 76.3 kg (168 lb 3.2 oz)       PHYSICAL EXAM:  General appearance: Appears well, no distress.  Pulmonary: Normal work of breathing. Acyanotic.  Abdominal: soft, nontender, nondistended, no masses or organomegaly.  Skin: No jaundice. No rashes.  Neurologic: Alert, oriented, and appropriate.  Psychiatric: Appropriate.

## 2023-02-27 DIAGNOSIS — K219 Gastro-esophageal reflux disease without esophagitis: Principal | ICD-10-CM

## 2023-02-27 MED ORDER — PANTOPRAZOLE 20 MG TABLET,DELAYED RELEASE
ORAL_TABLET | Freq: Every day | ORAL | 1 refills | 30 days
Start: 2023-02-27 — End: 2024-02-27

## 2023-02-27 NOTE — Unmapped (Unsigned)
[facility] at [facility]  Established Patient Clinic Note    Assessment/Plan:   Vincent Waters is a 42 y.o.adult who presents for follow-up.    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 - Primary     - Current meds:   Current Outpatient Medications:     albuterol 2.5 mg /3 mL (0.083 %) nebulizer solution, Inhale 3 mL (2.5 mg total) by nebulization every six (6) hours as needed for wheezing or shortness of breath., Disp: 90 mL, Rfl: 1    ascorbic acid, vitamin C, (VITAMIN C) 100 MG tablet, Take 1 tablet (100 mg total) by mouth daily., Disp: , Rfl:     azelastine (ASTELIN) 137 mcg (0.1 %) nasal spray, Instill 2 sprays into each nostril two (2) times a day as needed for rhinitis., Disp: 30 mL, Rfl: 1    budesonide-formoterol (SYMBICORT) 160-4.5 mcg/actuation inhaler, Inhale 1 puff by mouth Two (2) times a day., Disp: 10.2 g, Rfl: 5    cetirizine (ZYRTEC) 10 MG tablet, Take 1 tablet (10 mg total) by mouth daily., Disp: 30 tablet, Rfl: 12    clindamycin (CLEOCIN T) 1 % lotion, 1 Application as needed., Disp: , Rfl:     clonazePAM (KLONOPIN) 0.5 MG tablet, Take 1/2 tablet (0.25 mg total) by mouth once daily as needed for anxiety.  May take an additional 1/2 tab every other day as needed for 14 days. After 14 days, decrease to taking 1/2 tab once daily as needed, without the additional dose., Disp: 30 tablet, Rfl: 0    dextroamphetamine-amphetamine (ADDERALL) 20 mg tablet, Take 1 tablet (20 mg total) by mouth as needed for up to 10 doses., Disp: 10 tablet, Rfl: 0    diclofenac sodium (VOLTAREN) 1 % gel, Apply 2 g topically four (4) times a day., Disp: 100 g, Rfl: 0    emtricitabine-tenofovir alafen (DESCOVY) 200-25 mg tablet, Take 1 tablet by mouth daily., Disp: 90 tablet, Rfl: 0    EPINEPHrine (EPIPEN) 0.3 mg/0.3 mL injection, Inject 0.3 mL (0.3 mg total) under the skin once for 1 dose., Disp: 2 each, Rfl: 0    famotidine (PEPCID) 40 MG tablet, Take 1 tablet (40 mg total) by mouth every evening., Disp: 30 tablet, Rfl: 1    gabapentin (NEURONTIN) 250 mg/5 mL oral solution, Take 12 mL (600 mg total) by mouth Three (3) times a day., Disp: 1080 mL, Rfl: 2    gabapentin (NEURONTIN) 250 mg/5 mL oral solution, Take 12 mL (600 mg total) by mouth Three (3) times a day., Disp: 1080 mL, Rfl: 1    hydrOXYzine (ATARAX) 25 MG tablet, Take 1 tablet (25 mg total) by mouth., Disp: , Rfl:     ibuprofen (MOTRIN) 800 MG tablet, Take 1 tablet (800 mg total) by mouth every eight (8) hours as needed for pain., Disp: 60 tablet, Rfl: 2    inhalational spacing device Spcr, Use as directed with albuterol and symbicort, Disp: 1 each, Rfl: 1    ipratropium (ATROVENT) 42 mcg (0.06 %) nasal spray, 1-2 sprays in each nostril as needed for drainage, up to 4 time daily., Disp: 15 mL, Rfl: 2    nebulizer and compressor (COMP-AIR NEBULIZER COMPRESSOR) Devi, 1 each by Miscellaneous route nightly as needed., Disp: 1 each, Rfl: 0    needle, disp, 25 [place] (BD REGULAR BEVEL NEEDLES) 25 Chayanne x 5/8 Ndle, For subcutaneous hormone injection., Disp: 25 each, Rfl: 0  nifedipine 0.3% lidocaine 1.5% in petrolatum ointment, Apply a pea-size amount to anal area 3 times a day, Disp: 100 g, Rfl: 0    omalizumab 300 mg/2 mL AtIn, Inject the contents of 1 pen (300 mg) under the skin every fourteen (14) days., Disp: 4 mL, Rfl: 11    omalizumab 75 mg/0.5 mL AtIn, Inject the contents of 1 pen (75 mg) under the skin every fourteen (14) days., Disp: 1 mL, Rfl: 11    omeprazole (PRILOSEC) 40 MG capsule, Take 1 capsule (40 mg total) by mouth daily., Disp: 90 capsule, Rfl: 3    propranolol (INDERAL) 40 MG tablet, Take 1 tablet (40 mg total) by mouth four (4) times a day., Disp: , Rfl:     prucalopride 2 mg Tab, Take 1 tablet (2 mg total) by mouth daily., Disp: 90 tablet, Rfl: 0    safety needles (BD ECLIPSE) 25 Aashritha x 1 Ndle, use for testosterone, Disp: 12 each, Rfl: 0    safety needles (BD SAFETYGLIDE NEEDLE) 18 Raniah x 1 1/2 Ndle, For drawing hormone injection, Disp: 25 each, Rfl: 0    syringe, disposable, (EASY TOUCH LUER LOCK SYRINGE) 1 mL Syrg, Use for weekly hormone injection., Disp: 4 each, Rfl: 0    syringe, disposable, (EASY TOUCH LUER LOCK SYRINGE) 1 mL Syrg, Use for testosterone, Disp: 12 each, Rfl: 0    syringe, disposable, 2.5 mL Syrg, Use weekly, Disp: 30 Syringe, Rfl: 2    triamcinolone (KENALOG) 0.1 % cream, Apply topically Two (2) times a day., Disp: 15 g, Rfl: 0    Current Facility-Administered Medications:     omalizumab [name]) injection 300 mg, 300 mg, Subcutaneous, Q28 Days, [name], [name], [name], 300 mg at 05/24/22 1138    # Benzodiazepine dependence, continuous - panic attacks    # Elevated BP    # Gender Affirming Care  Vincent Waters is a transgender *** on HRT for gender-affirming care. Pt has been doing well on current dose. Physical changes since last visit include ***. Emotional/social changes include ***. Current non-medical gender-affirming practices include ***. Current support system includes ***. No new medical concerns. Pt denies chest pain, shortness of breath, dizziness, lightheadedness, vision changes, or any other concerning symptoms. No active CAD, history of polycythemia vera, or hormone sensitive tumors. After discussion of risks/benefits, pt would like to ***. Will collect monitoring labs today per above. 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 including a marked incongruence between one???s experienced/expressed gender and primary and/or secondary sex characteristics; a strong desire to be rid of one???s primary and/or secondary sex characteristics because of a marked incongruence with one???s experienced/expressed gender; a strong desire for the primary and/or secondary sex characteristics of the other gender; a strong desire to be of the other gender (or some alternative gender different from one???s assigned gender); a strong desire to be treated as the other gender (or some alternative gender different from one???s assigned gender); and/or a strong conviction that one has the typical feelings and reactions of the other gender (or some alternative gender different from one???s assigned gender), and thus this treatment offers significant benefit for their physical and mental health.   - Continue HRT per above  - Labs today: testosterone, UPT; hematocrit 45 on 01/19/23  - PrEP eligibility: ***  - Pregnancy risk assessment: ***    Testosterone Formulations: Approximate Dose Equivalent Chart     Injectable  Patch  Gel Pellets  50mg  weekly (0.19mL of 200mg /mL solution weekly)  2-4mg   20-50mg   375mg  (5x 75mg  pellets)    80mg  weekly (0.9mL of 200mg /mL solution weekly)  6mg   50-75mg   525mg  (7x 75mg  pellets)    100mg  weekly (0.87mL of 200mg /mL solution weekly)  8mg   75-100mg   750mg  (10x 75mg  pellets)    120mg  weekly (0.74mL of 200mg /mL solution)    900mg  (12x 75mg  pellets)     # GI   Was not on opiates last time he got gastric emptying study  Wants protonix instead of   Regurgitating every night, sometimes has aspiration which triggers his asthma      {TIP - HCC- RAFF Pilot- Clinical Documentation Specialist Recommendations-  Benzodiazepine dependence, continuous   -If this condition is still relevant, please review at the upcoming appointment and provide clinical support of how this is affecting his care, and/or if this is being managed   This text will self delete upon signing note:[id number]}  HEALTH MAINTENANCE ITEMS STILL DUE:  Health Maintenance Due   Topic Date Due    Pneumococcal Vaccine 0-64 (2 of 2 - PCV) 06/24/2021    Influenza Vaccine (1) 02/19/2023     Follow-up: No follow-ups on file.    Future Appointments   Date Time Provider Department Center   02/28/2023 11:20 AM Linus Salmons, MD UNCFMFAYS [facility]   02/28/2023  1:00 PM Seymour Bars, PT PTOT [facility]   03/07/2023  1:45 PM Seymour Bars, PT [facility]   03/14/2023  1:45 PM Seymour Bars, PT PTOT [facility]   03/16/2023  8:00 AM Juanda Chance, MD [facility]   03/16/2023 12:00 PM [facility] RM 2 [facility]   04/03/2023  2:00 PM Demason, Mal Amabile, MD UNCOTOME[facility]IL [facility]   04/18/2023  8:30 AM Mebrahtu, Bennetta Laos, FNP ANESPAINMRKT [facility]   05/08/2023 12:30 PM Caid, Letha Cape, MD UNCALLERGET [facility]   05/31/2023  2:20 PM Mahoro, Champ Mungo, MD UNCGIMEDET [facility]   07/06/2023 10:20 AM SPCAUD AUDIOLOGIST [place] [facility]   07/06/2023 10:45 AM Kimple, Adam [place], MD UNCOTOME[facility]IL [facility]   07/25/2023  8:30 AM Almodovar Sunday Shams, MD [facility]   11/16/2023  9:00 AM Thora Lance, MD HVSURG [facility]       Subjective   Vincent Waters is a 42 y.o. adult  coming to clinic today for the following issues:    No chief complaint on file.    HPI: see above for more information.     I have reviewed the problem list, medications, and allergies and have updated/reconciled them if needed.    Vincent Waters  reports that he has never smoked. He has never been exposed to tobacco smoke. He has never used smokeless tobacco.  Health Maintenance   Topic Date Due    Pneumococcal Vaccine 0-64 (2 of 2 - PCV) 06/24/2021    Influenza Vaccine (1) 02/19/2023    DTaP/Tdap/Td Vaccines (2 - Td or Tdap) 08/19/2027    Lipid Screening  12/26/2027    Hepatitis C Screen  Completed    COVID-19 Vaccine  Completed       Objective     VITALS: LMP 07/20/2017 (Exact Date)     Physical Exam  General: well-appearing, sitting upright in no acute distress  Head: Normocephalic, atraumatic  ENT: No dental trauma noted.   Eyes: conjunctiva normal, non-erythematous, non-icteric, no discharge.  Neck: no thyroid enlargement or masses  Lungs: No  increased work of breathing or audible wheezing  Skin: Warm, dry, no erythema or rash on exposed skin  Musculoskeletal: No visible gait abnormalities  Neurologic: Alert & oriented x 3, no gross sensorimotor abnormalities  Psychiatric: Pleasant, cooperative, good eye contact, appropriate thought processes    Wt Readings from Last 3 Encounters:   02/22/23 75.2 kg (165 lb 12.8 oz)   02/13/23 75.7 kg (166 lb 12.8 oz)   01/24/23 75.2 kg (165 lb 12.8 oz)      PHQ-9 PHQ-9 TOTAL SCORE   10/22/2020   9:00 AM 6   09/04/2020   9:00 AM 3   08/21/2020   9:00 AM 5   05/22/2020   3:00 PM 9     PHQ-2 Score:      PHQ-9 Score:      Inocente Salles Score:      {select_status_or_delete_smartlist:64641}    GAD-7 score:      LABS/IMAGING  {Labs/Imaging options:52765}    Alver Fisher, MD, MPH (he/him)  [facility] at [facility]  [address] [place], [place] [address]  Phone: [phone]  Fax: [phone]

## 2023-02-28 ENCOUNTER — Ambulatory Visit
Admit: 2023-02-28 | Discharge: 2023-03-01 | Payer: PRIVATE HEALTH INSURANCE | Attending: Student in an Organized Health Care Education/Training Program | Primary: Student in an Organized Health Care Education/Training Program

## 2023-02-28 DIAGNOSIS — F640 Transsexualism: Principal | ICD-10-CM

## 2023-02-28 DIAGNOSIS — Z0000 Encounter for general adult medical examination without abnormal findings: Principal | ICD-10-CM

## 2023-02-28 DIAGNOSIS — L739 Follicular disorder, unspecified: Principal | ICD-10-CM

## 2023-02-28 DIAGNOSIS — Z79899 Other long term (current) drug therapy: Principal | ICD-10-CM

## 2023-02-28 DIAGNOSIS — K5900 Constipation, unspecified: Principal | ICD-10-CM

## 2023-02-28 DIAGNOSIS — F1320 Sedative, hypnotic or anxiolytic dependence, uncomplicated: Principal | ICD-10-CM

## 2023-02-28 LAB — TOXICOLOGY SCREEN, URINE
AMPHETAMINE SCREEN URINE: NEGATIVE
BARBITURATE SCREEN URINE: NEGATIVE
BENZODIAZEPINE SCREEN, URINE: NEGATIVE
BUPRENORPHINE, URINE SCREEN: NEGATIVE
CANNABINOID SCREEN URINE: NEGATIVE
COCAINE(METAB.)SCREEN, URINE: NEGATIVE
FENTANYL SCREEN, URINE: NEGATIVE
METHADONE SCREEN, URINE: NEGATIVE
OPIATE SCREEN URINE: NEGATIVE
OXYCODONE SCREEN URINE: NEGATIVE

## 2023-02-28 LAB — TESTOSTERONE: TESTOSTERONE TOTAL: 515 ng/dL — ABNORMAL HIGH

## 2023-02-28 MED ORDER — CLONAZEPAM 0.5 MG TABLET
ORAL | 0 refills | 30 days | Status: CP | PRN
Start: 2023-02-28 — End: ?
  Filled 2023-03-02: qty 15, 30d supply, fill #0

## 2023-02-28 MED ORDER — CLINDAMYCIN 1 % LOTION
Freq: Every day | TOPICAL | 0 refills | 0 days | Status: CP | PRN
Start: 2023-02-28 — End: ?
  Filled 2023-03-02: qty 60, 30d supply, fill #0

## 2023-02-28 MED ORDER — DESCOVY 200 MG-25 MG TABLET
ORAL_TABLET | Freq: Every day | ORAL | 0 refills | 90 days | Status: CP
Start: 2023-02-28 — End: ?
  Filled 2023-03-02: qty 90, 90d supply, fill #0

## 2023-02-28 NOTE — Unmapped (Signed)
[facility]  [address]                                  [place] , [place] [address]    [phone]    [facility] did not show for their scheduled Physical Therapy follow-up session.  Patient called late to cancel due to sickness.  Please contact me if you have any questions or concerns.    Thank you for this referral,     Signed: Patton Salles, PT  02/28/2023 1:19 PM

## 2023-02-28 NOTE — Unmapped (Unsigned)
[facility] THERAPY SERVICES AT [facility] [place]  OUTPATIENT PHYSICAL THERAPY  02/28/2023          Patient Name: [facility]  Date of Birth:September 14, 1980  Diagnosis:   Encounter Diagnoses   Name Primary?    Bilateral hip pain Yes    Weakness of left lower extremity      Referring MD:  Dawna Part, *     Date of Onset of Impairment-No date available  Date PT Care Plan Established or Reviewed-No date available  Date PT Treatment Started-No date available   Plan of Care Effective Date:          Assessment/Plan:    Assessment  Assessment details:    Ibrohim is doing great.  Pain is coming down, muscles are less spasmed and his hip ROM is improving.  Recommended that he speak with his trainer about his hip extension strength and he agreed to do this.  All questions answered.  This patient requires skilled physical therapy services to address the outlined impairments in order to return to his desired level of function.           Impairments: impaired ADLs, impaired sensation, pain, trigger points, decreased strength, hypertonicity, muscular restrictions and postural weakness                      Therapy Goals      Goals:      Short Term Goals:  In 6 weeks:  Goal #1: Patient will be independent with progressively monitored home exercise program.   Baseline/Current: Patient is unfamiliar with home exercise program.     Goal #2: Patient will demonstrate normal palpation for decreased pain and proper biomechanical function.  Baseline/Current: Patient has spasmed left hip musculature.    Goal #3: Patient will report improved sleeping.  Baseline/Current: Patient has difficulty finding a comfortable position to sleep in.    Long Term Goals:  In 12 weeks:  Goal #1: Patient will demonstrate left hip strength within normal limits without pain in order to allow for functional tasks such as symmetrical walking, squatting and negotiating stairs.   Baseline/Current: Patient has painful and limited left hip strength (see manual muscle testing).      Goal #2: Patient will report minimal symptoms with weight bearing activities such as standing, walking and sit<>stand.  Baseline/Current: Patient has pain with these activities.    Goal #3: Patient will report centralization of lower extremity symptoms (pain, tingling, numbness and weakness).  Baseline/Current: Patient has pain and weakness that radiates into his left lower extremity.    Goal #4: Patient's FOTO score will improve from 26 at initial evaluation to >/= 54 at discharge in order to demonstrate a significant improvement in self-reported outcome measures that directly result from his course of physical therapy.   Baseline/Current: FOTO = 26    Plan    Therapy options: will be seen for skilled physical therapy services    Planned therapy interventions: 20560, 20561-Dry Needling 1-2, 3+ areas, 97110-Therapeutic Exercises, 97112-Neuromuscular Re-education, 97140-Manual Therapy and 97530-Therapeutic Activities      Frequency: 1x week    Duration in weeks: 12    Education provided to: patient.    Education provided: Anatomy, HEP, Symptom management and Treatment options and plan    Education results: needs reinforcement, verbalized good understanding, demonstrates understanding and needs further instruction.    Communication/Consultation: N/A.    Next visit plan:        Flexion based strengthening for  back (reduce lordosis), TDN/STM left ER's and Abductors, stretching, left extension hip strengthening    Total Session Time: 35    Treatment rendered today:      Manual Therapy: 35 minutes  - Lumbar Mobilization in Side Lying  - Soft Tissue Mobilization left QL and lumbar paraspinals  - Soft Tissue Mobilization left hip external rotators  - Left external rotator MET  - PROM Hip ER  Intramuscular Therapy: 5 minutes  [facility]  was informed of the risks associated with dry needling and he signed consent form.  Patient screened for any contraindications to needling including pregnancy, infection, antibiotic use, anticoagulants use, autoimmune issues, uncontrolled diabetes, and active cancer/treatment and does not have any of these conditions.  DRY NEEDLING FOR RELEASING IDENTIFIED TRIGGER POINTS TO FACILITATE MANUAL THERAPY TECHNIQUES AND ALLOW MUSCLE TO RETURN TO OPTIMAL LENGTH (NO CHARGE).  Left piriformis trigger point with 60mm x .30mm with Flat Palpation/Direct Approach established for safety - Local Twitch Response produced     Plan details: Access Code: [email or web address]  URL: [email or web address]  Date: 01/27/2023  Prepared by: Christianne Dolin    Exercises  - Supine Double Knee to Chest  - 1 x daily - 7 x weekly - 1 sets - 20 reps - 5 hold  - Supine Posterior Pelvic Tilt  - 1 x daily - 7 x weekly - 1 sets - 20 reps - 5 hold  - Supine Figure 4 Piriformis Stretch (Mirrored)  - 1 x daily - 7 x weekly - 3 sets - 10 reps - 10 hold  - Supine Figure 4 Piriformis Stretch (Mirrored)  - 1 x daily - 7 x weekly - 1 sets - 10 reps - 10 hold  - Supine Sciatic Nerve Glide  - 1 x daily - 7 x weekly - 2 sets - 10 reps - 2-3 hold      Subjective:     History of Present Condition     Date of onset:  01/02/2021    Date is approximation?: yes    Explanation:  Patient reports symptoms started in 2022.    History of Present Condition/Chief Complaint:  Mr. Suite is a 42 y.o. right hand dominant adult who presents to outpatient physical therapy with bilateral hip pain that began in 2022.  Patient cannot remember any specific traumatic mechanism of injury that may have been the result of his symptoms.  He remembers that he woke up one day and his left hip started aching, and then the right one started hurting.  Left hurts more than the right.  Right hip clicks when he moves it.  He also has central low back pain.  Subjective:  Mr. Nguyenthi reports that ***.  He is compliant with his Home Exercise Program.  Pain:     Current pain rating:  3  Location:  Left lateral/posterior hip, right angerior/groin/lateral    Quality:  Numbing, aching and throbbing    Progression:  No change      Objective:     Static Posture Assessment   Lumbar Spine-Increased lordosis.     Palpation   Left Hip   Tenderness of the piriformis.  Trigger point to piriformis.                                  I attest that I have reviewed the above information.  Signed: Patton Salles, PT  02/28/2023 10:02 AM

## 2023-03-01 LAB — SYPHILIS SCREEN: SYPHILIS RPR SCREEN: NONREACTIVE

## 2023-03-01 NOTE — Unmapped (Signed)
Hi Nikolaus,    It was great to see*** you today. I look forward to seeing you at your next visit. In the meantime, please feel free to message me on mychart with any non-urgent questions or concerns (for example, refill requests for a long-term medication, or inquiries regarding vaccine availability). I do my best to respond to messages within 2 business days, however occasionally it may take longer depending on my other clinical duties.     For time-sensitive and/or new clinical concerns (including evaluation of new symptoms, medication adjustments, time-sensitive referral requests, or completion of disability or pre-employment paperwork), please call [phone] to request a phone, video, or in-person visit with me or one of my colleagues, so that we may address your concern as safely and expediently as possible.     Additionally, [facility] has an Urgent Care at our [facility] location! Call [phone] for same-day appointments, available Monday-Friday 7am-8pm; Saturday and Sunday 12pm-5pm. If you think you are having an emergency, please call 911 or go to your nearest emergency department.     If you ever need urgent help with your mental health, including for any thoughts of self-harm or suicide, please call the [place] mental health crisis line at [phone], or the national suicide prevention hotline at 210-108-5371.     Take care,    Claudie Revering, MD, MPH (he/him)  [facility] at [facility]  [address]   [place], [place] [address]  Phone: [phone]  Fax: [phone]

## 2023-03-01 NOTE — Unmapped (Signed)
EGD  Procedure #1     Procedure #2   [id number]  MRN   Vincent Waters   Endoscopist     Is the patient's health insurance [employer], [employer], [place] [employer] (UHC), [employer]HC [place], [employer], or [place]?     Urgent procedure     Are you pregnant?     Are you in the process of scheduling or awaiting results of a heart ultrasound, stress test, or catheterization to evaluate new or worsening chest pain, dizziness, or shortness of breath?     Do you take: Plavix (clopidogrel), Coumadin (warfarin), Lovenox (enoxaparin), Pradaxa (dabigatran), Effient (prasugrel), Xarelto (rivaroxaban), Eliquis (apixaban), Pletal (cilostazol), or Brilinta (ticagrelor)?          Did ordering provider indicate how long to hold this medication in the order comments?          Which of the above medications are you taking?          What is the name of the medical practice that manages this medication?          What is the name of the medical provider who manages this medication?     Do you have hemophilia, von Willebrand disease, or low platelets?     Do you have a pacemaker or implanted cardiac defibrillator?     Has a [place] [facility] provider specified the location(s)?     Which location(s) did the [facility] provider specify?          [facility]          [place]          [facility]     Do you see a liver specialist for chronic liver disease?     Is the procedure indication for variceal screening?     Is procedure indication for variceal banding (this does NOT include variceal screening)?     Have you had a heart attack, stroke or heart stent placement within the past 6 months?     Month of event     Year of event (ONLY ENTER LAST 2 DIGITS)        5  Height (feet)   4  Height (inches)   164  Weight (pounds)   28.1  BMI          Did the ordering provider specify a bowel prep?          What bowel prep was specified?     Do you have an ostomy (bag on your stomach that collects your stool)?          Is it an ileostomy?          Is it a colostomy? Patient doesn't know.     Do you have chronic kidney disease?     Do you have chronic constipation or have you had poor quality bowel preps for past colonoscopies?     Do you have Crohn's disease or ulcerative colitis?     Have you had weight loss surgery?          When you walk around your house or grocery store, do you have to stop and rest due to shortness of breath, chest pain, or light-headedness?     Do you ever use supplemental oxygen?     Have you been hospitalized for cirrhosis of the liver or heart failure in the last 12 months?     Have you been treated for mouth or throat cancer with radiation or surgery?  Have you been told that it is difficult for doctors to insert a breathing tube in you during anesthesia?     Have you had a heart or lung transplant?          Are you on dialysis?     Do you have cirrhosis of the liver?     Do you have myasthenia gravis?     Is the patient a prisoner?   ################# ## ###################################################################################################################   MRN:  [id number]   Anticoag Review  No   Nurse Triage  No   GI clinic consult  No   Procedure(s):  EGD     0   Endoscopist:  Vincent Waters    Urgent:  No   Prep:                    --------------------------- --- ----------------------------------------------------------------------------------------------------------------------------------------------------------------------------   [facility] Locations:  [facility]     [facility]     [place]        Requested Locations:              ################# ## ###################################################################################################################

## 2023-03-02 DIAGNOSIS — R12 Heartburn: Principal | ICD-10-CM

## 2023-03-02 MED ORDER — TESTOSTERONE CYPIONATE 200 MG/ML INTRAMUSCULAR OIL
INTRAMUSCULAR | 0 refills | 175 days | Status: CP
Start: 2023-03-02 — End: ?
  Filled 2023-03-24: qty 5, 35d supply, fill #0

## 2023-03-02 MED ORDER — PANTOPRAZOLE 40 MG TABLET,DELAYED RELEASE
ORAL_TABLET | Freq: Every day | ORAL | 3 refills | 90 days | Status: CP
Start: 2023-03-02 — End: 2024-03-01

## 2023-03-02 NOTE — Unmapped (Signed)
Addended by: Marylyn Ishihara on: 03/02/2023 12:27 PM     Modules accepted: Orders

## 2023-03-03 DIAGNOSIS — G528 Disorders of other specified cranial nerves: Principal | ICD-10-CM

## 2023-03-03 LAB — HIV ANTIGEN/ANTIBODY COMBO: HIV ANTIGEN/ANTIBODY COMBO: NONREACTIVE

## 2023-03-03 NOTE — Unmapped (Signed)
Patient notes around 70% relief for almost 2 months with pulsed RFA and nerve block of spinal accessory nerve.  Given good improvement, especially with the ability to avoid steroids, will repeat the procedure.    Orders Placed This Encounter   Procedures    Nerve Inj Other Periph Nerve [phone])     Right spinal accessory nerve block and pulsed RFA under ultrasound  No driver  No PIV     Standing Status:   Future     Standing Expiration Date:   03/02/2024    Ultrasonic guidance for needle placement (60454)     Standing Status:   Future     Standing Expiration Date:   03/02/2024

## 2023-03-07 ENCOUNTER — Ambulatory Visit
Admit: 2023-03-07 | Payer: PRIVATE HEALTH INSURANCE | Attending: Rehabilitative and Restorative Service Providers" | Primary: Rehabilitative and Restorative Service Providers"

## 2023-03-07 ENCOUNTER — Ambulatory Visit
Admit: 2023-03-07 | Discharge: 2023-03-31 | Payer: PRIVATE HEALTH INSURANCE | Attending: Rehabilitative and Restorative Service Providers" | Primary: Rehabilitative and Restorative Service Providers"

## 2023-03-07 NOTE — Unmapped (Signed)
[facility] THERAPY SERVICES AT [facility] [place]  OUTPATIENT PHYSICAL THERAPY  03/07/2023  Note Type: Treatment Note       Patient Name: Vincent Waters  Date of Birth:07-22-1980  Diagnosis:   Encounter Diagnoses   Name Primary?    Bilateral hip pain Yes    Weakness of left lower extremity      Referring MD:  Dawna Part, *     Date of Onset of Impairment-01/02/2021  Date PT Care Plan Established or Reviewed-01/03/2023  Date PT Treatment Started-01/03/2023   Plan of Care Effective Date:          Assessment/Plan:    Assessment  Assessment details:    Vincent Waters is doing well.  Pain is down and left hip muscles are not nearly as spasmed   Focusing on improving core/left hip strength.  He does need some verbal cuing for correct exercise performance.   Written instructions given for new exercises.  He committed to performing them consistently as part of his home exercise program. All questions answered.  This patient requires skilled physical therapy services to address the outlined impairments in order to return to his desired level of function.           Impairments: impaired ADLs, impaired sensation, pain, trigger points, decreased strength, hypertonicity, muscular restrictions and postural weakness                      Therapy Goals      Goals:      Short Term Goals:  In 6 weeks:  Goal #1: Patient will be independent with progressively monitored home exercise program.   Baseline/Current: Patient is unfamiliar with home exercise program.     Goal #2: Patient will demonstrate normal palpation for decreased pain and proper biomechanical function.  Baseline/Current: Patient has spasmed left hip musculature.    Goal #3: Patient will report improved sleeping.  Baseline/Current: Patient has difficulty finding a comfortable position to sleep in.    Long Term Goals:  In 12 weeks:  Goal #1: Patient will demonstrate left hip strength within normal limits without pain in order to allow for functional tasks such as symmetrical walking, squatting and negotiating stairs.   Baseline/Current: Patient has painful and limited left hip strength (see manual muscle testing).      Goal #2: Patient will report minimal symptoms with weight bearing activities such as standing, walking and sit<>stand.  Baseline/Current: Patient has pain with these activities.    Goal #3: Patient will report centralization of lower extremity symptoms (pain, tingling, numbness and weakness).  Baseline/Current: Patient has pain and weakness that radiates into his left lower extremity.    Goal #4: Patient's FOTO score will improve from 26 at initial evaluation to >/= 54 at discharge in order to demonstrate a significant improvement in self-reported outcome measures that directly result from his course of physical therapy.   Baseline/Current: FOTO = 26    Plan    Therapy options: will be seen for skilled physical therapy services    Planned therapy interventions: 20560, 20561-Dry Needling 1-2, 3+ areas, 97110-Therapeutic Exercises, 97112-Neuromuscular Re-education, 97140-Manual Therapy and 97530-Therapeutic Activities      Frequency: 1x week    Duration in weeks: 12    Education provided to: patient.    Education provided: Anatomy, HEP, Symptom management and Treatment options and plan    Education results: needs reinforcement, verbalized good understanding, demonstrates understanding and needs further instruction.    Communication/Consultation: N/A.  Next visit plan:        Flexion based strengthening for back (reduce lordosis), TDN/STM left ER's and Abductors, stretching, left extension hip strengthening    Total Session Time: 45    Treatment rendered today:      Therapeutic Exercise: 45 minutes  Patient Education: Plan of Care and Home Exercise Program With Handout.   Exercises:  - DKTC  - Figure 4 Press: 10 x 10  - Figure 4 Stretch: 10 x 10  - Supine Pelvic Tilt: 15 x 5  - Pelvic Tilt/Curl Up: 2 x 10  - Supine Dead Bug: 2 x 10  - Side Lying Clam: 2 x [10 x 3']  - Side Lying Hip Abduction: 10 x     Plan details: Access Code: [email or web address]  URL: [email or web address]  Date: 03/07/2023  Prepared by: Christianne Dolin    Exercises  - Supine Double Knee to Chest  - 1 x daily - 7 x weekly - 1 sets - 20 reps - 5 hold  - Supine Posterior Pelvic Tilt  - 1 x daily - 7 x weekly - 1 sets - 20 reps - 5 hold  - Supine Figure 4 Piriformis Stretch (Mirrored)  - 1 x daily - 7 x weekly - 3 sets - 10 reps - 10 hold  - Supine Figure 4 Piriformis Stretch (Mirrored)  - 1 x daily - 7 x weekly - 1 sets - 10 reps - 10 hold  - Supine Sciatic Nerve Glide  - 1 x daily - 7 x weekly - 2 sets - 10 reps - 2-3 hold  - Curl Up with Arms Crossed  - 1 x daily - 7 x weekly - 2 sets - 10 reps - 1 hold  - Supine Dead Bug with Leg Extension  - 1 x daily - 7 x weekly - 2 sets - 10 reps  - Clamshell with Resistance  - 1 x daily - 7 x weekly - 2 sets - 10 reps - 3 hold  - Sidelying Hip Abduction (Mirrored)  - 1 x daily - 7 x weekly - 2 sets - 10 reps      Subjective:     History of Present Condition     Date of onset:  01/02/2021    Date is approximation?: yes    Explanation:  Patient reports symptoms started in 2022.    History of Present Condition/Chief Complaint:  Vincent Waters is a 42 y.o. right hand dominant adult who presents to outpatient physical therapy with bilateral hip pain that began in 2022.  Patient cannot remember any specific traumatic mechanism of injury that may have been the result of his symptoms.  He remembers that he woke up one day and his left hip started aching, and then the right one started hurting.  Left hurts more than the right.  Right hip clicks when he moves it.  He also has central low back pain.  Subjective:  Vincent Waters reports that he has been struggling with some extended symptoms after COVID, which is making his body very achy.  Overall, his hips are feeling pretty good.  He is compliant with his Home Exercise Program.  Pain:     Current pain rating:  3  Location:  Left lateral/posterior hip, right angerior/groin/lateral    Quality:  Numbing, aching and throbbing    Progression:  No change      Objective:     Static Posture Assessment   Lumbar Spine-Increased lordosis.  Palpation   Left Hip   Tenderness of the piriformis.    Strength  Left Hip   Planes of Motion   Flexion: 5  Extension: 5  Abduction: 4  External rotation: 4  Internal rotation: 5                                  I attest that I have reviewed the above information.  Signed: Patton Salles, PT  03/07/2023 1:43 PM

## 2023-03-10 ENCOUNTER — Encounter: Admit: 2023-03-10 | Discharge: 2023-03-10 | Payer: PRIVATE HEALTH INSURANCE

## 2023-03-10 ENCOUNTER — Ambulatory Visit: Admit: 2023-03-10 | Discharge: 2023-03-10 | Payer: PRIVATE HEALTH INSURANCE

## 2023-03-10 MED ADMIN — propofol (DIPRIVAN) infusion 10 mg/mL: INTRAVENOUS | @ 19:00:00 | Stop: 2023-03-10

## 2023-03-10 MED ADMIN — lidocaine (PF) (XYLOCAINE-MPF) 20 mg/mL (2 %) injection: INTRAVENOUS | @ 19:00:00 | Stop: 2023-03-10

## 2023-03-10 MED ADMIN — lactated Ringers infusion: INTRAVENOUS | @ 18:00:00 | Stop: 2023-03-10

## 2023-03-10 MED ADMIN — Propofol (DIPRIVAN) injection: INTRAVENOUS | @ 19:00:00 | Stop: 2023-03-10

## 2023-03-10 MED ADMIN — lactated Ringers infusion: 10 mL/h | INTRAVENOUS | @ 18:00:00 | Stop: 2023-03-10

## 2023-03-14 NOTE — Unmapped (Signed)
[facility] THERAPY SERVICES AT [facility] [place]  OUTPATIENT PHYSICAL THERAPY  03/14/2023  Note Type: Treatment Note       Patient Name: Vincent Waters  Date of Birth:09/17/1980  Diagnosis:   Encounter Diagnoses   Name Primary?    Bilateral hip pain Yes    Weakness of left lower extremity        Referring MD:  Dawna Part, *     Date of Onset of Impairment-01/02/2021  Date PT Care Plan Established or Reviewed-01/03/2023  Date PT Treatment Started-01/03/2023   Plan of Care Effective Date:          Assessment/Plan:    Assessment  Assessment details:    Vincent Waters is doing great.  Pain is down and left hip muscles are not nearly as spasmed   Focusing on improving core/left hip strength.  He does need some verbal cuing for correct exercise performance.   Written instructions given for new exercises.  He committed to performing them consistently as part of his home exercise program. All questions answered.  This patient requires skilled physical therapy services to address the outlined impairments in order to return to his desired level of function.           Impairments: impaired ADLs, impaired sensation, pain, trigger points, decreased strength, hypertonicity, muscular restrictions and postural weakness                      Therapy Goals      Goals:      Short Term Goals:  In 6 weeks:  Goal #1: Patient will be independent with progressively monitored home exercise program.   Baseline/Current: Patient is unfamiliar with home exercise program.     Goal #2: Patient will demonstrate normal palpation for decreased pain and proper biomechanical function.  Baseline/Current: Patient has spasmed left hip musculature.    Goal #3: Patient will report improved sleeping.  Baseline/Current: Patient has difficulty finding a comfortable position to sleep in.    Long Term Goals:  In 12 weeks:  Goal #1: Patient will demonstrate left hip strength within normal limits without pain in order to allow for functional tasks such as symmetrical walking, squatting and negotiating stairs.   Baseline/Current: Patient has painful and limited left hip strength (see manual muscle testing).      Goal #2: Patient will report minimal symptoms with weight bearing activities such as standing, walking and sit<>stand.  Baseline/Current: Patient has pain with these activities.    Goal #3: Patient will report centralization of lower extremity symptoms (pain, tingling, numbness and weakness).  Baseline/Current: Patient has pain and weakness that radiates into his left lower extremity.    Goal #4: Patient's FOTO score will improve from 26 at initial evaluation to >/= 54 at discharge in order to demonstrate a significant improvement in self-reported outcome measures that directly result from his course of physical therapy.   Baseline/Current: FOTO = 26    Plan    Therapy options: will be seen for skilled physical therapy services    Planned therapy interventions: 20560, 20561-Dry Needling 1-2, 3+ areas, 97110-Therapeutic Exercises, 97112-Neuromuscular Re-education, 97140-Manual Therapy and 97530-Therapeutic Activities      Frequency: 1x week    Duration in weeks: 12    Education provided to: patient.    Education provided: Anatomy, HEP, Symptom management and Treatment options and plan    Education results: needs reinforcement, verbalized good understanding, demonstrates understanding and needs further instruction.    Communication/Consultation: N/A.  Next visit plan:        Flexion based strengthening for back (reduce lordosis), TDN/STM left ER's and Abductors, stretching, left extension hip strengthening    Total Session Time: 40    Treatment rendered today:      Therapeutic Exercise: 40 minutes  Patient Education: Plan of Care, TA and Home Exercise Program With Handout.   Exercises:  - DKTC  - Figure 4 Press: 10 x 10  - Figure 4 Stretch: 10 x 10  - Supine Pelvic Tilt: 20 x 5  - Pelvic Tilt/Curl Up: 2 x 12  - Supine TA  - Supine TA/March  - Supine TA/Bridge With Alternating Knee Extension    Plan details: Access Code: [email or web address]  URL: [email or web address]  Date: 03/14/2023  Prepared by: Christianne Dolin    Exercises  - Supine Double Knee to Chest  - 1 x daily - 7 x weekly - 1 sets - 20 reps - 5 hold  - Supine Posterior Pelvic Tilt  - 1 x daily - 7 x weekly - 1 sets - 20 reps - 5 hold  - Supine Figure 4 Piriformis Stretch (Mirrored)  - 1 x daily - 7 x weekly - 3 sets - 10 reps - 10 hold  - Supine Figure 4 Piriformis Stretch (Mirrored)  - 1 x daily - 7 x weekly - 1 sets - 10 reps - 10 hold  - Supine Sciatic Nerve Glide  - 1 x daily - 7 x weekly - 2 sets - 10 reps - 2-3 hold  - Curl Up with Arms Crossed  - 1 x daily - 7 x weekly - 2 sets - 10 reps - 1 hold  - Supine Dead Bug with Leg Extension  - 1 x daily - 7 x weekly - 2 sets - 10 reps  - Clamshell with Resistance  - 1 x daily - 7 x weekly - 2 sets - 10 reps - 3 hold  - Sidelying Hip Abduction (Mirrored)  - 1 x daily - 7 x weekly - 2 sets - 10 reps  - Hooklying Transversus Abdominis Palpation  - 1 x daily - 7 x weekly - 1 sets - 15 reps - 5 hold  - Supine March  - 1 x daily - 7 x weekly - 2 sets - 10 reps  - Supine Bridge with Leg Extension  - 1 x daily - 7 x weekly - 2 sets - 10 reps - 5 hold      Subjective:     History of Present Condition     Date of onset:  01/02/2021    Date is approximation?: yes    Explanation:  Patient reports symptoms started in 2022.    History of Present Condition/Chief Complaint:  Vincent Waters is a 42 y.o. right hand dominant adult who presents to outpatient physical therapy with bilateral hip pain that began in 2022.  Patient cannot remember any specific traumatic mechanism of injury that may have been the result of his symptoms.  He remembers that he woke up one day and his left hip started aching, and then the right one started hurting.  Left hurts more than the right.  Right hip clicks when he moves it.  He also has central low back pain.  Subjective:  Vincent Waters reports that he is 70-75% better.  He is compliant with his Home Exercise Program.  Pain:     Current pain rating:  0    Progression:  Improved  Objective:     Static Posture Assessment   Lumbar Spine-Increased lordosis.     Palpation   Left Hip   No palpable tenderness to the piriformis.                                   I attest that I have reviewed the above information.  Signed: Patton Salles, PT  03/14/2023 1:41 PM

## 2023-03-16 ENCOUNTER — Ambulatory Visit: Admit: 2023-03-16 | Discharge: 2023-03-18 | Payer: PRIVATE HEALTH INSURANCE

## 2023-03-16 MED ADMIN — Tc-99m Sulfur Colloid (~~LOC~~): 1.1 | ORAL | @ 17:00:00 | Stop: 2023-03-16

## 2023-03-16 NOTE — Unmapped (Unsigned)
[facility]  Established Patient Clinic Note    Assessment/Plan:   Vincent Waters is a 42 y.o.adult    Problem List Items Addressed This Visit    None      Attending: Dr. Marland Kitchen    Subjective   Vincent Waters is a 42 y.o. adult  coming to clinic today for the following issues:    No chief complaint on file.    HPI:    ***    I have reviewed the problem list, medications, and allergies and have updated/reconciled them if needed.    Vincent Waters  reports that he has never smoked. He has never been exposed to tobacco smoke. He has never used smokeless tobacco.  Health Maintenance   Topic Date Due    Pneumococcal Vaccine 0-64 (2 of 2 - PCV) 06/24/2021    DTaP/Tdap/Td Vaccines (2 - Td or Tdap) 08/19/2027    Lipid Screening  12/26/2027    Hepatitis C Screen  Completed    COVID-19 Vaccine  Completed    Influenza Vaccine  Completed       Objective     VITALS: LMP 07/20/2017 (Exact Date)     Physical Exam    LABS/IMAGING  {Labs/Imaging options:52765}      Juanda Chance, MD  Family Medicine Resident PGY-3  [facility] [place] [facility]  [address], [place], [place] [address]  Telephone [phone]  Fax [phone]  [email or web address]

## 2023-03-17 NOTE — Unmapped (Signed)
[facility] Clinical Assessment & Refill Coordination Note    Vincent Waters, [place]: Oct 15, 1980  Phone: [phone] (home) [phone] (work)    All above HIPAA information was verified with patient.     Was a Nurse, learning disability used for this call? No    Specialty Medication(s):   CF/Pulmonary/Asthma: Xolair 300 + 75mg      Current Outpatient Medications   Medication Sig Dispense Refill    albuterol 2.5 mg /3 mL (0.083 %) nebulizer solution Inhale 3 mL (2.5 mg total) by nebulization every six (6) hours as needed for wheezing or shortness of breath. 90 mL 1    azelastine (ASTELIN) 137 mcg (0.1 %) nasal spray Instill 2 sprays into each nostril two (2) times a day as needed for rhinitis. 30 mL 1    budesonide-formoterol (SYMBICORT) 160-4.5 mcg/actuation inhaler Inhale 1 puff by mouth Two (2) times a day. 10.2 g 5    cetirizine (ZYRTEC) 10 MG tablet Take 1 tablet (10 mg total) by mouth daily. 30 tablet 12    clindamycin (CLEOCIN T) 1 % lotion Apply 1 Application topically as needed. 60 mL 0    clonazePAM (KLONOPIN) 0.5 MG tablet Take 0.5 tablets (0.25 mg total) by mouth daily as needed for anxiety. 15 tablet 0    diclofenac sodium (VOLTAREN) 1 % gel Apply 2 g topically four (4) times a day. 100 g 0    emtricitabine-tenofovir alafen (DESCOVY) 200-25 mg tablet Take 1 tablet by mouth daily. 90 tablet 0    EPINEPHrine (EPIPEN) 0.3 mg/0.3 mL injection Inject 0.3 mL (0.3 mg total) under the skin once for 1 dose. 2 each 0    famotidine (PEPCID) 40 MG tablet Take 1 tablet (40 mg total) by mouth every evening. 30 tablet 1    gabapentin (NEURONTIN) 250 mg/5 mL oral solution Take 12 mL (600 mg total) by mouth Three (3) times a day. 1080 mL 2    gabapentin (NEURONTIN) 250 mg/5 mL oral solution Take 12 mL (600 mg total) by mouth Three (3) times a day. 1080 mL 1    inhalational spacing device Spcr Use as directed with albuterol and symbicort 1 each 1    ipratropium (ATROVENT) 42 mcg (0.06 %) nasal spray 1-2 sprays in each nostril as needed for drainage, up to 4 time daily. 15 mL 2    nebulizer and compressor (COMP-AIR NEBULIZER COMPRESSOR) Devi 1 each by Miscellaneous route nightly as needed. 1 each 0    needle, disp, 25 Vincent Waters (BD REGULAR BEVEL NEEDLES) 25 Vincent Waters x 5/8 Ndle For subcutaneous hormone injection. 25 each 0    nifedipine 0.3% lidocaine 1.5% in petrolatum ointment Apply a pea-size amount to anal area 3 times a day 100 g 0    omalizumab 300 mg/2 mL AtIn Inject the contents of 1 pen (300 mg) under the skin every fourteen (14) days. 4 mL 11    omalizumab 75 mg/0.5 mL AtIn Inject the contents of 1 pen (75 mg) under the skin every fourteen (14) days. 1 mL 11    pantoprazole (PROTONIX) 40 MG tablet Take 1 tablet (40 mg total) by mouth daily. 90 tablet 3    prucalopride 2 mg Tab Take 1 tablet (2 mg total) by mouth daily. 90 tablet 0    safety needles (BD ECLIPSE) 25 Vincent Waters x 1 Ndle use for testosterone 12 each 0    safety needles (BD SAFETYGLIDE NEEDLE) 18 Vincent Waters x 1 1/2 Ndle For drawing hormone injection 25 each  0    syringe, disposable, (EASY TOUCH LUER LOCK SYRINGE) 1 mL Syrg Use for weekly hormone injection. 4 each 0    syringe, disposable, (EASY TOUCH LUER LOCK SYRINGE) 1 mL Syrg Use for testosterone 12 each 0    syringe, disposable, 2.5 mL Syrg Use weekly 30 Syringe 2    testosterone cypionate (DEPOTESTOTERONE CYPIONATE) 200 mg/mL injection Inject 0.2 mL (40 mg total) into the muscle once a week. 5 mL 0    triamcinolone (KENALOG) 0.1 % cream Apply topically Two (2) times a day. 15 g 0     Current Facility-Administered Medications   Medication Dose Route Frequency Provider Last Rate Last Admin    omalizumab Vincent Waters) injection 300 mg  300 mg Subcutaneous Q28 Days Vincent Waters, Vincent Waters, [name]   300 mg at 05/24/22 1138        Changes to medications: Vincent Waters reports no changes at this time.    Allergies   Allergen Reactions    Latex Rash and Hives    Penicillins Anaphylaxis     Pt can tolerate amoxicillin   Has patient had a PCN reaction causing immediate rash, facial/tongue/throat swelling, SOB or lightheadedness with hypotension: no   Has patient had a PCN reaction causing severe rash involving mucus membranes or skin necrosis: no  Has patient had a PCN reaction that required hospitalization: unknown  Has patient had a PCN reaction occurring within the last 10 years: no  If all of the above answers are NO, then may proceed with Cephalosporin use.    Shellfish Containing Products Anaphylaxis    Other Rash     Nifedipine 0.3%, Lidocaine 1.5% in petrolatum ointment caused rash on his Bottom    Latex, Natural Rubber Rash    Penicillin Rash       Changes to allergies: No    SPECIALTY MEDICATION ADHERENCE         Medication Adherence    Patient reported X missed doses in the last month: 0  Specialty Medication: Xolair 375mg  Q14d  Patient is on additional specialty medications: No  Patient is on more than two specialty medications: No  Informant: patient          Specialty medication(s) dose(s) confirmed: Regimen is correct and unchanged.     Are there any concerns with adherence? No    Adherence counseling provided? Not needed    CLINICAL MANAGEMENT AND INTERVENTION      Clinical Benefit Assessment:    Do you feel the medicine is effective or helping your condition? Yes    Clinical Benefit counseling provided? Progress note from 02/13/23 shows evidence of clinical benefit    Adverse Effects Assessment:    Are you experiencing any side effects? No    Are you experiencing difficulty administering your medicine? No    Quality of Life Assessment:    Quality of Life    Rheumatology  Oncology  Dermatology  Cystic Fibrosis          How many days over the past month did your asthma  keep you from your normal activities? For example, brushing your teeth or getting up in the morning. 0    Have you discussed this with your provider? Not needed    Acute Infection Status:    Acute infections noted within Epic:  No active infections  Patient reported infection: None    Therapy Appropriateness:    Is therapy appropriate based on current medication list, adverse reactions, adherence, clinical benefit and progress toward achieving  therapeutic goals? Yes, therapy is appropriate and should be continued     DISEASE/MEDICATION-SPECIFIC INFORMATION      For patients on injectable medications: Patient currently has 0 doses left.  Next injection is scheduled for 10/1 but will take on 10/2.    Asthma and Allergy: Have you had an asthma exacerbation in the last 30 days? No  Have you needed to use your rescue inhaler more often than usual in the last 30 days? No  Have you needed to take steroids for your asthma in the last 30 days? No    PATIENT SPECIFIC NEEDS     Does the patient have any physical, cognitive, or cultural barriers? No    Is the patient high risk? No    Did the patient require a clinical intervention? No    Does the patient require physician intervention or other additional services (i.e., nutrition, smoking cessation, social work)? No    SOCIAL DETERMINANTS OF HEALTH     At the [facility], we have learned that life circumstances - like trouble affording food, housing, utilities, or transportation can affect the health of many of our patients.   That is why we wanted to ask: are you currently experiencing any life circumstances that are negatively impacting your health and/or quality of life? Patient declined to answer    Social Determinants of Health     Food Insecurity: No Food Insecurity (01/24/2023)    Hunger Vital Sign     Worried About Running Out of Food in the Last Year: Never true     Ran Out of Food in the Last Year: Never true   Internet Connectivity: Possible Internet connectivity concern identified (01/24/2023)    Internet Connectivity     Do you have access to internet services: Yes     How do you connect to the internet: Not on file     Is your internet connection strong enough for you to watch video on your device without major problems?: Not on file     Do you have enough data to get through the month?: Not on file     Does at least one of the devices have a camera that you can use for video chat?: Not on file   Housing/Utilities: Low Risk  (01/24/2023)    Housing/Utilities     Within the past 12 months, have you ever stayed: outside, in a car, in a tent, in an overnight shelter, or temporarily in someone else's home (i.e. couch-surfing)?: No     Are you worried about losing your housing?: No     Within the past 12 months, have you been unable to get utilities (heat, electricity) when it was really needed?: No   Tobacco Use: Low Risk  (03/14/2023)    Patient History     Smoking Tobacco Use: Never     Smokeless Tobacco Use: Never     Passive Exposure: Never   Transportation Needs: Unknown (01/24/2023)    PRAPARE - Transportation     Lack of Transportation (Medical): Patient declined     Lack of Transportation (Non-Medical): No   Alcohol Use: Not At Risk (07/27/2021)    Alcohol Use     How often do you have a drink containing alcohol?: Never     How many drinks containing alcohol do you have on a typical day when you are drinking?: Not on file     How often do you have 5 or more drinks on one occasion?:  Never   Interpersonal Safety: Unknown (03/17/2023)    Interpersonal Safety     Unsafe Where You Currently Live: Not on file     Physically Hurt by Anyone: Not on file     Abused by Anyone: Not on file   Physical Activity: Unknown (07/27/2021)    Exercise Vital Sign     Days of Exercise per Week: 1 day     Minutes of Exercise per Session: Not on file   Intimate Partner Violence: Not At Risk (07/27/2021)    Humiliation, Afraid, Rape, and Kick questionnaire     Fear of Current or Ex-Partner: No     Emotionally Abused: No     Physically Abused: No     Sexually Abused: No   Stress: Stress Concern Present (07/27/2021)    [employer] of Occupational Health - Occupational Stress Questionnaire     Feeling of Stress : To some extent Substance Use: Low Risk  (07/27/2021)    Substance Use     Taken prescription drugs for non-medical reasons: Never     Taken illegal drugs: Never     Patient indicated they have taken drugs in the past year for non-medical reasons: Yes, [positive answer(s)]: Not on file   Social Connections: Moderately Isolated (07/27/2021)    Social Connection and Isolation Panel [NHANES]     Frequency of Communication with Friends and Family: More than three times a week     Frequency of Social Gatherings with Friends and Family: More than three times a week     Attends Religious Services: More than 4 times per year     Active Member of [employer] or Organizations: No     Attends Banker Meetings: Never     Marital Status: Never married   Physicist, medical Strain: Patient Declined (01/24/2023)    Overall Financial Resource Strain (CARDIA)     Difficulty of Paying Living Expenses: Patient declined   Depression: Not at risk (11/25/2022)    PHQ-2     PHQ-2 Score: 0   Health Literacy: Medium Risk (07/27/2021)    Health Literacy     : Rarely       Would you be willing to receive help with any of the needs that you have identified today? Not applicable       SHIPPING     Specialty Medication(s) to be Shipped:   CF/Pulmonary/Asthma: Xolair 300 and 75 mg    Other medication(s) to be shipped: No additional medications requested for fill at this time     Changes to insurance: No    Delivery Scheduled: Yes, Expected medication delivery date: 10/2.     Medication will be delivered via Same Day Courier to the confirmed prescription address in [facility].    The patient will receive a drug information handout for each medication shipped and additional FDA Medication Guides as required.  Verified that patient has previously received a Conservation officer, historic buildings and a Surveyor, mining.    The patient or caregiver noted above participated in the development of this care plan and knows that they can request review of or adjustments to the care plan at any time.      All of the patient's questions and concerns have been addressed.    Julianne Rice, PharmD   [facility] Specialty Pharmacist

## 2023-03-22 MED FILL — XOLAIR 75 MG/0.5 ML SUBCUTANEOUS AUTO-INJECTOR: SUBCUTANEOUS | 28 days supply | Qty: 1 | Fill #1

## 2023-03-22 MED FILL — XOLAIR 300 MG/2 ML SUBCUTANEOUS AUTO-INJECTOR: SUBCUTANEOUS | 28 days supply | Qty: 4 | Fill #1

## 2023-03-22 NOTE — Unmapped (Unsigned)
Otolaryngology New Consultation Visit Note      Reason for Visit:  OSA - does not tolerate CPAP    History of Present Illness    Vincent Waters is 42 y.o. adult being seen in consultation at the request of Rayvon Char PA for evaluation of OSA.     I personally reviewed Rhinology note   Septoplasty done. Here for [facility] eval.     I personally reviewed PSG 12/2020  AHI 6    Gasping/choking at night or stopping breathing:   Daytime fatigue:  Morning headaches:  Frequent naps:  Falling asleep at inappropriate times:  Blood pressure issues:  Difficultly breathing out of nose:  Sleep study:   CPAP use:  Mask that patient has tried  Compliance data   Other sleep surgery or oral appliance:  Lung or heart issues:  Bleeding issues or blood thinners:  Problems with GA:    The patient denies fevers, chills, shortness of breath, chest pain, nausea, vomiting, diarrhea, inability to lie flat, dysphagia, odynophagia, hemoptysis, hematemesis, changes in vision, changes in voice quality, otalgia, otorrhea, vertiginous symptoms, focal deficits, or other concerning symptoms.    Past Medical History     has a past medical history of Anxiety, Arthritis, Asthma, Autoimmune autonomic neuropathy, Breast cyst, Financial difficulties, Lack of access to transportation, Migraines, Neuromuscular disorder (CMS-HCC), Scleroderma (CMS-HCC), Sleep apnea treated with continuous positive airway pressure (CPAP), and Visual impairment.    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    Current Medications    Current Outpatient Medications   Medication Sig Dispense Refill    albuterol 2.5 mg /3 mL (0.083 %) nebulizer solution Inhale 3 mL (2.5 mg total) by nebulization every six (6) hours as needed for wheezing or shortness of breath. 90 mL 1    azelastine (ASTELIN) 137 mcg (0.1 %) nasal spray Instill 2 sprays into each nostril two (2) times a day as needed for rhinitis. 30 mL 1    budesonide-formoterol (SYMBICORT) 160-4.5 mcg/actuation inhaler Inhale 1 puff by mouth Two (2) times a day. 10.2 g 5    cetirizine (ZYRTEC) 10 MG tablet Take 1 tablet (10 mg total) by mouth daily. 30 tablet 12    clindamycin (CLEOCIN T) 1 % lotion Apply 1 Application topically as needed. 60 mL 0    clonazePAM (KLONOPIN) 0.5 MG tablet Take 0.5 tablets (0.25 mg total) by mouth daily as needed for anxiety. 15 tablet 0    diclofenac sodium (VOLTAREN) 1 % gel Apply 2 g topically four (4) times a day. 100 g 0    emtricitabine-tenofovir alafen (DESCOVY) 200-25 mg tablet Take 1 tablet by mouth daily. 90 tablet 0    EPINEPHrine (EPIPEN) 0.3 mg/0.3 mL injection Inject 0.3 mL (0.3 mg total) under the skin once for 1 dose. 2 each 0    famotidine (PEPCID) 40 MG tablet Take 1 tablet (40 mg total) by mouth every evening. 30 tablet 1    gabapentin (NEURONTIN) 250 mg/5 mL oral solution Take 12 mL (600 mg total) by mouth Three (3) times a  day. 1080 mL 2    gabapentin (NEURONTIN) 250 mg/5 mL oral solution Take 12 mL (600 mg total) by mouth Three (3) times a day. 1080 mL 1    inhalational spacing device Spcr Use as directed with albuterol and symbicort 1 each 1    ipratropium (ATROVENT) 42 mcg (0.06 %) nasal spray 1-2 sprays in each nostril as needed for drainage, up to 4 time daily. 15 mL 2    nebulizer and compressor (COMP-AIR NEBULIZER COMPRESSOR) Devi 1 each by Miscellaneous route nightly as needed. 1 each 0    needle, disp, 25 Sheila (BD REGULAR BEVEL NEEDLES) 25 Alano x 5/8 Ndle For subcutaneous hormone injection. 25 each 0    nifedipine 0.3% lidocaine 1.5% in petrolatum ointment Apply a pea-size amount to anal area 3 times a day 100 g 0    omalizumab (XOLAIR) 300 mg/2 mL auto-injector Inject the contents of 1 pen (300 mg) under the skin every fourteen (14) days. 4 mL 11    omalizumab 75 mg/0.5 mL AtIn Inject the contents of 1 pen (75 mg) under the skin every fourteen (14) days. 1 mL 11    pantoprazole (PROTONIX) 40 MG tablet Take 1 tablet (40 mg total) by mouth daily. 90 tablet 3    prucalopride 2 mg Tab Take 1 tablet (2 mg total) by mouth daily. 90 tablet 0    safety needles (BD ECLIPSE) 25 Nyjah x 1 Ndle use for testosterone 12 each 0    safety needles (BD SAFETYGLIDE NEEDLE) 18 Autrey x 1 1/2 Ndle For drawing hormone injection 25 each 0    syringe, disposable, (EASY TOUCH LUER LOCK SYRINGE) 1 mL Syrg Use for weekly hormone injection. 4 each 0    syringe, disposable, (EASY TOUCH LUER LOCK SYRINGE) 1 mL Syrg Use for testosterone 12 each 0    syringe, disposable, 2.5 mL Syrg Use weekly 30 Syringe 2    testosterone cypionate (DEPOTESTOTERONE CYPIONATE) 200 mg/mL injection Inject 0.2 mL (40 mg total) into the muscle once a week. 5 mL 0    triamcinolone (KENALOG) 0.1 % cream Apply topically Two (2) times a day. 15 g 0     Current Facility-Administered Medications   Medication Dose Route Frequency Provider Last Rate Last Admin    omalizumab [name]) injection 300 mg  300 mg Subcutaneous Q28 Days [name], [name], [name]   300 mg at 05/24/22 1138       Allergies    Allergies   Allergen Reactions    Latex Rash and Hives    Penicillins Anaphylaxis     Pt can tolerate amoxicillin   Has patient had a PCN reaction causing immediate rash, facial/tongue/throat swelling, SOB or lightheadedness with hypotension: no   Has patient had a PCN reaction causing severe rash involving mucus membranes or skin necrosis: no  Has patient had a PCN reaction that required hospitalization: unknown  Has patient had a PCN reaction occurring within the last 10 years: no  If all of the above answers are NO, then may proceed with Cephalosporin use.    Shellfish Containing Products Anaphylaxis    Other Rash     Nifedipine 0.3%, Lidocaine 1.5% in petrolatum ointment caused rash on his Bottom    Latex, Natural Rubber Rash    Penicillin Rash       Family History    family history includes Breast cancer in his maternal grandmother and sister; Cancer in his mother and sister; Cancer (age of onset: 21) in his  maternal uncle; Cataracts in his maternal grandmother; Hypertension in his father; Ovarian cancer in his maternal grandmother; Thyroid disease in his mother.    Social History:     reports that he has never smoked. He has never been exposed to tobacco smoke. He has never used smokeless tobacco.   reports that he does not currently use alcohol.   reports that he does not currently use drugs.    Review of Systems    A full 12-system review of systems is documented and negative except as noted in HPI.  Patient intake form reviewed.    Vital Signs  Last menstrual period 07/20/2017, not currently breastfeeding.    Physical Exam    General: Well-developed, well-nourished. Appropriate, comfortable, and in no apparent distress.  Voice: clear   Head/Face: On external examination there is no obvious asymmetry or scars. On palpation there is no masses within the salivary glands. Cranial nerves V and VII are intact through all distributions.  Eyes: PERRL, EOMI, the conjunctiva are not injected and sclera is non-icteric.  Ears:   Right ear: On external exam, there is no obvious lesions or asymmetry. No cerumen or lesions in the EAC. The TM is in the neutral position and mobile to pneumatic otoscopy. No middle ear masses or fluid noted.   Hearing is grossly intact.  Left ear: On external exam, there is no obvious lesions or asymmetry. No cerumen or lesions in the EAC. The TM is in the neutral position and mobile to pneumatic otoscopy. No middle ear masses or fluid noted.   Hearing is grossly intact.  Nose: No external masses, lesions, or deformity. Anterior rhinoscopy of the nasal mucosa, septum, and turbinates reveals a normal exam.   Oral cavity/oropharynx: The mucosa of the lips, gums, hard and soft palate, posterior pharyngeal wall, tongue, floor of mouth, and buccal region are without masses or lesions and are normally hydrated. Good dentition. Tongue protrudes midline. Tonsils are symmetric without any masses or lesions. Supraglottis not visualized due to gag reflex. Freidman ***  Neck: There is no asymmetry or masses. Trachea is midline. There is no enlargement of the thyroid or palpable thyroid nodules.   Lymphatics: There is no palpable lymphadenopathy along the jugulodiagastric, submental, or posterior cervical chains.  Chest: No audible wheeze, unlabored respirations.  Neurologic: Cranial nerve???s II-XII are grossly intact. Exam is non-focal.  Extremities: No cyanosis, clubbing or edema.    Procedures:  Flexible Fiberoptic Nasopharyngolaryngoscopy (CPT [id number]): To better evaluate the patient???s symptoms, fiberoptic laryngoscopy is indicated.  After discussion of risks and benefits, and topical decongestion and anesthesia, the endoscopy was performed without complications. A time out identifying the patient, the procedure, the location of the procedure and any concerns was performed prior to beginning the procedure.    Findings:  Absent adenoidal pad. Bilateral patent Eustachian tube orifices. Healthy, symmetric nasopharyngeal and pharyngeal mucosa with no masses, lesions, or friable mucosa. Symmetric base of tongue with clear vallecula bilaterally. No lingual tonsil hypertrophy.    Inspection of the larynx reveals no evidence of supraglottic or glottic masses. Epiglottis crisp. There is bilateral true vocal cord full range of motion with glottic competence. The piriform sinuses are clear bilaterally. No pooling of secretions in the piriform sinuses.     Outside Medical Records  I personally reviewed patient's medical records from the referring provider.     Assessment/Recommendations:    The patient is a 42 y.o. adult with a history of  has a past medical history of Anxiety,  Arthritis, Asthma, Autoimmune autonomic neuropathy, Breast cyst, Financial difficulties, Lack of access to transportation, Migraines, Neuromuscular disorder (CMS-HCC), Scleroderma (CMS-HCC), Sleep apnea treated with continuous positive airway pressure (CPAP), and Visual impairment. who presents for the evaluation of OSA. Patient does not tolerate CPAP.      The pathophysiology and treatment of adult obstructive sleep apnea were discussed in detail. We discussed that untreated obstructive sleep apnea is associated with significant morbidity, mortality, and decreased quality of life. We discussed that in general, adult obstructive sleep apnea is best treated by CPAP, with a dental appliance/mandibular advancement device as a second line of therapy for patients with mild OSA and surgery for mild-severe OSA. We also discussed the importance of weight loss in controlling obstructive sleep apnea.    Surgery for adult obstructive sleep apnea is typically reserved for inability to tolerate a prolonged trial of these treatments, or when a specific, substantial level of anatomic obstruction is identified. We discussed that in many cases sleep surgery may result in improvement in quality of life and AHI, but sleep apnea may persist and require ongoing medical treatment. Sleep surgery may assist in making the CPAP mask more tolerable and potentially lowering positive airway pressures.     Discussed that there are different types of surgeries such as UPPP, TORS, Tonsillectomy, bariatric surgery (N/A), MMA, hyoid suspension and hypoglossal nerve implant.      Given patient's anatomy and nasopharyngeal exam, patient would likely benefit from ***.     Recommend patient have a sleep endoscopy (DISE) to evaluate the airway while asleep to see what types of surgery will be most beneficial for patient's OSA. The risk, benefits and alternatives to DISE were discussed with patient including but not limited to bleeding, airway compromise and the risk of GA. Patient wishes to proceed. Will call scheduler to schedule.       The patient voiced complete understanding of plan as detailed above and is in full agreement.

## 2023-03-22 NOTE — Unmapped (Signed)
[facility] SUBJECT FOR NEW MSG: Medication Request Patient Name: Vincent Waters   Caller: Self (Patient)  Name of Caller: Vincent Waters, Vincent Waters   Have you contacted your pharmacy? yes      Last seen in-person: 02/28/2023  Last telemedicine visit: Visit date not found        Medication Name: Scarlette Calico   Dosage:0.25 mg   Route: Oral (PO)  Frequency: Daily  Day Supply Requested:   Pharmacy (Name & Address): [facility] at [facility]   [address], [place] [address]    Pharmacy Phone Number: [phone]   Are there refills on medications? no  Has [facility] refilled this medication before? Yes

## 2023-03-23 ENCOUNTER — Ambulatory Visit
Admit: 2023-03-23 | Discharge: 2023-03-24 | Payer: PRIVATE HEALTH INSURANCE | Attending: Student in an Organized Health Care Education/Training Program | Primary: Student in an Organized Health Care Education/Training Program

## 2023-03-23 DIAGNOSIS — F1320 Sedative, hypnotic or anxiolytic dependence, uncomplicated: Principal | ICD-10-CM

## 2023-03-23 DIAGNOSIS — Z0000 Encounter for general adult medical examination without abnormal findings: Principal | ICD-10-CM

## 2023-03-23 MED ORDER — CLONAZEPAM 0.5 MG TABLET
ORAL_TABLET | ORAL | 0 refills | 30 days | Status: CP | PRN
Start: 2023-03-23 — End: ?
  Filled 2023-03-24: qty 30, 30d supply, fill #0

## 2023-03-23 NOTE — Unmapped (Signed)
[facility]  Established Patient Clinic Note    Assessment/Plan:   Mr.Edwin is a 42 y.o.adult who presents for follow-up.    Problem List Items Addressed This Visit       Benzodiazepine dependence, continuous (CMS-HCC)    Relevant Medications    clonazePAM (KLONOPIN) 0.5 MG tablet    Generalized anxiety disorder - Primary     Other Visit Diagnoses       Gastrointestinal distress        Healthcare maintenance        Relevant Orders    COVID-19 VAC, (4YR+) (COMIRNATY) MRNA PFIZER  (Completed)          - Current meds:   Current Outpatient Medications:     albuterol 2.5 mg /3 mL (0.083 %) nebulizer solution, Inhale 3 mL (2.5 mg total) by nebulization every six (6) hours as needed for wheezing or shortness of breath., Disp: 90 mL, Rfl: 1    azelastine (ASTELIN) 137 mcg (0.1 %) nasal spray, Instill 2 sprays into each nostril two (2) times a day as needed for rhinitis., Disp: 30 mL, Rfl: 1    cetirizine (ZYRTEC) 10 MG tablet, Take 1 tablet (10 mg total) by mouth daily., Disp: 30 tablet, Rfl: 12    clindamycin (CLEOCIN T) 1 % lotion, Apply 1 Application topically as needed., Disp: 60 mL, Rfl: 0    diclofenac sodium (VOLTAREN) 1 % gel, Apply 2 g topically four (4) times a day., Disp: 100 g, Rfl: 0    emtricitabine-tenofovir alafen (DESCOVY) 200-25 mg tablet, Take 1 tablet by mouth daily., Disp: 90 tablet, Rfl: 0    famotidine (PEPCID) 40 MG tablet, Take 1 tablet (40 mg total) by mouth every evening., Disp: 30 tablet, Rfl: 1    gabapentin (NEURONTIN) 250 mg/5 mL oral solution, Take 12 mL (600 mg total) by mouth Three (3) times a day., Disp: 1080 mL, Rfl: 2    gabapentin (NEURONTIN) 250 mg/5 mL oral solution, Take 12 mL (600 mg total) by mouth Three (3) times a day., Disp: 1080 mL, Rfl: 1    inhalational spacing device Spcr, Use as directed with albuterol and symbicort, Disp: 1 each, Rfl: 1    ipratropium (ATROVENT) 42 mcg (0.06 %) nasal spray, 1-2 sprays in each nostril as needed for drainage, up to 4 time daily., Disp: 15 mL, Rfl: 2    nebulizer and compressor (COMP-AIR NEBULIZER COMPRESSOR) Devi, 1 each by Miscellaneous route nightly as needed., Disp: 1 each, Rfl: 0    needle, disp, 25 Jadan (BD REGULAR BEVEL NEEDLES) 25 Reyce x 5/8 Ndle, For subcutaneous hormone injection., Disp: 25 each, Rfl: 0    nifedipine 0.3% lidocaine 1.5% in petrolatum ointment, Apply a pea-size amount to anal area 3 times a day, Disp: 100 g, Rfl: 0    omalizumab (XOLAIR) 300 mg/2 mL auto-injector, Inject the contents of 1 pen (300 mg) under the skin every fourteen (14) days., Disp: 4 mL, Rfl: 11    omalizumab 75 mg/0.5 mL AtIn, Inject the contents of 1 pen (75 mg) under the skin every fourteen (14) days., Disp: 1 mL, Rfl: 11    pantoprazole (PROTONIX) 40 MG tablet, Take 1 tablet (40 mg total) by mouth daily., Disp: 90 tablet, Rfl: 3    prucalopride 2 mg Tab, Take 1 tablet (2 mg total) by mouth daily., Disp: 90 tablet, Rfl: 0    safety needles (BD ECLIPSE) 25 Raydon x 1 Ndle, use for testosterone, Disp:  12 each, Rfl: 0    safety needles (BD SAFETYGLIDE NEEDLE) 18 Hridaan x 1 1/2 Ndle, For drawing hormone injection, Disp: 25 each, Rfl: 0    syringe, disposable, (EASY TOUCH LUER LOCK SYRINGE) 1 mL Syrg, Use for weekly hormone injection., Disp: 4 each, Rfl: 0    syringe, disposable, (EASY TOUCH LUER LOCK SYRINGE) 1 mL Syrg, Use for testosterone, Disp: 12 each, Rfl: 0    syringe, disposable, 2.5 mL Syrg, Use weekly, Disp: 30 Syringe, Rfl: 2    testosterone cypionate (DEPOTESTOTERONE CYPIONATE) 200 mg/mL injection, Inject 0.2 mL (40 mg total) into the muscle once a week., Disp: 5 mL, Rfl: 0    triamcinolone (KENALOG) 0.1 % cream, Apply topically Two (2) times a day., Disp: 15 g, Rfl: 0    budesonide-formoterol (SYMBICORT) 160-4.5 mcg/actuation inhaler, Inhale 1 puff by mouth Two (2) times a day., Disp: 10.2 g, Rfl: 5    clonazePAM (KLONOPIN) 0.5 MG tablet, Take 0.5 tablets (0.25 mg total) by mouth two (2) times a day as needed for anxiety., Disp: 30 tablet, Rfl: 0    EPINEPHrine (EPIPEN) 0.3 mg/0.3 mL injection, Inject 0.3 mL (0.3 mg total) under the skin once for 1 dose., Disp: 2 each, Rfl: 0    Current Facility-Administered Medications:     omalizumab [name]) injection 300 mg, 300 mg, Subcutaneous, Q28 Days, [name], [name], [name], 300 mg at 05/24/22 1138    # Anxiety - Benzodiazepine dependence, continuous (CMS-HCC)  Pt was able to self-wean to just 0.25 mg clonazepam daily as of around one month ago, and was doing well on this for about two weeks before having an acute increase in anxiety and stress around multiple traumatic deaths in the family (lost four cousins in a large car accident, then another in a separate drunk driving incident), which led him to increase clonazepam to 0.25 mg BID (so a total of one 0.5 mg tablet daily). PDMP with appropriate refills, utox at last visit was appropriate. After discussion of risks and benefits, pt would like to continue at this increased dose for the next month or so, and feels very confident that he will be able to decrease back to 0.25 mg daily after that, in order to continue with self-wean off of clonazepam altogether. Pt will also schedule follow-up visit with [facility] therapist as this was previously helpful for him. Will plan to follow up in one month or sooner as needed. Pt has no further questions or concerns.   - clonazePAM (KLONOPIN) 0.5 MG tablet; Take 0.5 tablets (0.25 mg total) by mouth two (2) times a day as needed for anxiety.  Dispense: 30 tablet; Refill: 0  - Decrease clonazepam back to 0.25 mg daily after this month  - Pt to re-establish with [facility] therapist     # GI distress - question nondiabetic gastroparesis  Recent gastric emptying study with delayed emptying despite pt being off of opiates for several months, suggesting an alternate cause than opiate-induced constipation. Reviewed results of endoscopy and gastric emptying study with pt to the best of my ability given that he has not been able to contact his GI team (provider [place] of [place]), and messaged his GI team for care coordination.  - Care coordination with GI team  - Continue current management per GI    # Healthcare maintenance  - COVID-19 VAC, (89YR+) (COMIRNATY) MRNA PFIZER     HEALTH MAINTENANCE ITEMS STILL DUE:  Health Maintenance Due   Topic Date Due  Pneumococcal Vaccine 0-64 (2 of 2 - PCV) 06/24/2021     Follow-up: No follow-ups on file.    Future Appointments   Date Time Provider Department Center   03/28/2023  8:00 AM Linus Salmons, MD UNCF[facility]   04/03/2023  2:00 PM Demason, Mal Amabile, MD UNCOTOME[facility]IL [facility]   04/10/2023  8:00 AM Lobonc, Ammie Ferrier, MD ANESPAINMR[facility]   04/18/2023  8:30 AM Mebrahtu, Bennetta Laos, FNP [facility]   04/28/2023  8:00 AM Linus Salmons, MD [facility]   05/08/2023 12:30 PM Caid, Letha Cape, MD Parthenia Ames [facility]   05/31/2023  2:20 PM Mahoro, Champ Mungo, MD UNCGIMEDET [facility]   07/06/2023 10:20 AM SPCAUD AUDIOLOGIST [facility]   07/06/2023 10:45 AM Kimple, Adam [place], MD [facility]   07/25/2023  8:30 AM Almodovar Sunday Shams, MD [facility]   11/16/2023  9:00 AM Thora Lance, MD HVVSURMMNT [facility]       Subjective   Mr. Winchel is a 42 y.o. adult  coming to clinic today for the following issues:    Chief Complaint   Patient presents with    Follow-up     HPI: see above for more information.     I have reviewed the problem list, medications, and allergies and have updated/reconciled them if needed.    Mr. Czarnecki  reports that he has never smoked. He has never been exposed to tobacco smoke. He has never used smokeless tobacco.  Health Maintenance   Topic Date Due    Pneumococcal Vaccine 0-64 (2 of 2 - PCV) 06/24/2021    DTaP/Tdap/Td Vaccines (2 - Td or Tdap) 08/19/2027    Lipid Screening  12/26/2027    Hepatitis C Screen Completed    COVID-19 Vaccine  Completed    Influenza Vaccine  Completed       Objective     VITALS: BP 116/75  - Pulse 88  - Temp 36.6 ??C (97.9 ??F) (Oral)  - Ht 162.6 cm (5' 4)  - Wt 74.9 kg (165 lb 3.2 oz)  - LMP 07/20/2017 (Exact Date)  - BMI 28.36 kg/m??     Physical Exam  General: well-appearing, sitting upright in no acute distress  Head: Normocephalic, atraumatic  ENT: No dental trauma noted.   Eyes: conjunctiva normal, non-erythematous, non-icteric, no discharge.  Neck: no thyroid enlargement or masses  Lungs: No increased work of breathing or audible wheezing  Skin: Warm, dry, no erythema or rash on exposed skin  Musculoskeletal: No visible gait abnormalities  Neurologic: Alert & oriented x 3, no gross sensorimotor abnormalities  Psychiatric: Pleasant, cooperative, good eye contact, appropriate thought processes    Wt Readings from Last 3 Encounters:   03/23/23 74.9 kg (165 lb 3.2 oz)   03/10/23 73.9 kg (163 lb)   02/28/23 75.9 kg (167 lb 6.4 oz)      PHQ-9 PHQ-9 TOTAL SCORE   10/22/2020   9:00 AM 6   09/04/2020   9:00 AM 3   08/21/2020   9:00 AM 5   05/22/2020   3:00 PM 9     LABS/IMAGING  I have reviewed pertinent recent labs and imaging in Epic    Alver Fisher, MD, MPH (he/him)  [facility]  [address]   [place], [place] [address]  Phone: [phone]  Fax: [phone]

## 2023-03-24 MED ORDER — IHEALTH COVID-19 ANTIGEN RAPID HOME TEST KIT
PACK | PRN refills | 0 days
Start: 2023-03-24 — End: ?

## 2023-03-24 NOTE — Unmapped (Signed)
Hi Nikolaus,    It was great to see*** you today. I look forward to seeing you at your next visit. In the meantime, please feel free to message me on mychart with any non-urgent questions or concerns (for example, refill requests for a long-term medication, or inquiries regarding vaccine availability). I do my best to respond to messages within 2 business days, however occasionally it may take longer depending on my other clinical duties.     For time-sensitive and/or new clinical concerns (including evaluation of new symptoms, medication adjustments, time-sensitive referral requests, or completion of disability or pre-employment paperwork), please call [phone] to request a phone, video, or in-person visit with me or one of my colleagues, so that we may address your concern as safely and expediently as possible.     Additionally, [facility] has an Urgent Care at our [facility] location! Call [phone] for same-day appointments, available Monday-Friday 7am-8pm; Saturday and Sunday 12pm-5pm. If you think you are having an emergency, please call 911 or go to your nearest emergency department.     If you ever need urgent help with your mental health, including for any thoughts of self-harm or suicide, please call the [place] mental health crisis line at [phone], or the national suicide prevention hotline at 210-108-5371.     Take care,    Claudie Revering, MD, MPH (he/him)  [facility] at [facility]  [address]   [place], [place] [address]  Phone: [phone]  Fax: [phone]

## 2023-03-28 DIAGNOSIS — K3184 Gastroparesis: Principal | ICD-10-CM

## 2023-03-28 MED ORDER — METOCLOPRAMIDE 10 MG TABLET
ORAL_TABLET | Freq: Three times a day (TID) | ORAL | 0 refills | 60 days | Status: CP
Start: 2023-03-28 — End: ?
  Filled 2023-03-31: qty 90, 60d supply, fill #0

## 2023-03-28 MED ORDER — PRUCALOPRIDE 2 MG TABLET
ORAL_TABLET | Freq: Every day | ORAL | 0 refills | 90 days | Status: CP
Start: 2023-03-28 — End: ?
  Filled 2023-04-06: qty 90, 90d supply, fill #0

## 2023-03-29 NOTE — Unmapped (Unsigned)
[facility]  OUTPATIENT PHYSICAL THERAPY  03/29/2023          Patient Name: Vincent [facility]  Date of Birth:1980-09-23  Diagnosis:   Encounter Diagnoses   Name Primary?    Bilateral hip pain Yes    Weakness of left lower extremity        Referring MD:  Dawna Part, *     Date of Onset of Impairment-No date available  Date PT Care Plan Established or Reviewed-No date available  Date PT Treatment Started-No date available   Plan of Care Effective Date:          Assessment/Plan:    Assessment  Assessment details:    Vincent Waters is doing great.  Pain is down and left hip muscles are not nearly as spasmed   Focusing on improving core/left hip strength.  He does need some verbal cuing for correct exercise performance.   Written instructions given for new exercises.  He committed to performing them consistently as part of his home exercise program. All questions answered.  This patient requires skilled physical therapy services to address the outlined impairments in order to return to his desired level of function.           Impairments: impaired ADLs, impaired sensation, pain, trigger points, decreased strength, hypertonicity, muscular restrictions and postural weakness                      Therapy Goals      Goals:      Short Term Goals:  In 6 weeks:  Goal #1: Patient will be independent with progressively monitored home exercise program.   Baseline/Current: Patient is unfamiliar with home exercise program.     Goal #2: Patient will demonstrate normal palpation for decreased pain and proper biomechanical function.  Baseline/Current: Patient has spasmed left hip musculature.    Goal #3: Patient will report improved sleeping.  Baseline/Current: Patient has difficulty finding a comfortable position to sleep in.    Long Term Goals:  In 12 weeks:  Goal #1: Patient will demonstrate left hip strength within normal limits without pain in order to allow for functional tasks such as symmetrical walking, squatting and negotiating stairs.   Baseline/Current: Patient has painful and limited left hip strength (see manual muscle testing).      Goal #2: Patient will report minimal symptoms with weight bearing activities such as standing, walking and sit<>stand.  Baseline/Current: Patient has pain with these activities.    Goal #3: Patient will report centralization of lower extremity symptoms (pain, tingling, numbness and weakness).  Baseline/Current: Patient has pain and weakness that radiates into his left lower extremity.    Goal #4: Patient's FOTO score will improve from 26 at initial evaluation to >/= 54 at discharge in order to demonstrate a significant improvement in self-reported outcome measures that directly result from his course of physical therapy.   Baseline/Current: FOTO = 26    Plan    Therapy options: will be seen for skilled physical therapy services    Planned therapy interventions: 20560, 20561-Dry Needling 1-2, 3+ areas, 97110-Therapeutic Exercises, 97112-Neuromuscular Re-education, 97140-Manual Therapy and 97530-Therapeutic Activities      Frequency: 1x week    Duration in weeks: 12    Education provided to: patient.    Education provided: Anatomy, HEP, Symptom management and Treatment options and plan    Education results: needs reinforcement, verbalized good understanding, demonstrates understanding and needs further instruction.  Communication/Consultation: N/A.    Next visit plan:        Flexion based strengthening for back (reduce lordosis), TDN/STM left ER's and Abductors, stretching, left extension hip strengthening    Total Session Time: 40    Treatment rendered today:      Therapeutic Exercise: 40 minutes  Patient Education: Plan of Care, TA and Home Exercise Program With Handout.   Exercises:  - DKTC  - Figure 4 Press: 10 x 10  - Figure 4 Stretch: 10 x 10  - Supine Pelvic Tilt: 20 x 5  - Pelvic Tilt/Curl Up: 2 x 12  - Supine TA  - Supine TA/March  - Supine TA/Bridge With Alternating Knee Extension    Plan details: Access Code: [email or web address]  URL: [email or web address]  Date: 03/14/2023  Prepared by: Christianne Dolin    Exercises  - Supine Double Knee to Chest  - 1 x daily - 7 x weekly - 1 sets - 20 reps - 5 hold  - Supine Posterior Pelvic Tilt  - 1 x daily - 7 x weekly - 1 sets - 20 reps - 5 hold  - Supine Figure 4 Piriformis Stretch (Mirrored)  - 1 x daily - 7 x weekly - 3 sets - 10 reps - 10 hold  - Supine Figure 4 Piriformis Stretch (Mirrored)  - 1 x daily - 7 x weekly - 1 sets - 10 reps - 10 hold  - Supine Sciatic Nerve Glide  - 1 x daily - 7 x weekly - 2 sets - 10 reps - 2-3 hold  - Curl Up with Arms Crossed  - 1 x daily - 7 x weekly - 2 sets - 10 reps - 1 hold  - Supine Dead Bug with Leg Extension  - 1 x daily - 7 x weekly - 2 sets - 10 reps  - Clamshell with Resistance  - 1 x daily - 7 x weekly - 2 sets - 10 reps - 3 hold  - Sidelying Hip Abduction (Mirrored)  - 1 x daily - 7 x weekly - 2 sets - 10 reps  - Hooklying Transversus Abdominis Palpation  - 1 x daily - 7 x weekly - 1 sets - 15 reps - 5 hold  - Supine March  - 1 x daily - 7 x weekly - 2 sets - 10 reps  - Supine Bridge with Leg Extension  - 1 x daily - 7 x weekly - 2 sets - 10 reps - 5 hold      Subjective:     History of Present Condition     Date of onset:  01/02/2021    Date is approximation?: yes    Explanation:  Patient reports symptoms started in 2022.    History of Present Condition/Chief Complaint:  Vincent Waters is a 42 y.o. right hand dominant adult who presents to outpatient physical therapy with bilateral hip pain that began in 2022.  Patient cannot remember any specific traumatic mechanism of injury that may have been the result of his symptoms.  He remembers that he woke up one day and his left hip started aching, and then the right one started hurting.  Left hurts more than the right.  Right hip clicks when he moves it.  He also has central low back pain.  Subjective:  Vincent Waters reports that ***.  He is compliant with his Home Exercise Program.  Pain:     Current pain rating:  0    Progression:  Improved      Objective:     Static Posture Assessment   Lumbar Spine-Increased lordosis.     Palpation   Left Hip   No palpable tenderness to the piriformis.                                   I attest that I have reviewed the above information.  Signed: Patton Salles, PT  03/29/2023 9:58 AM

## 2023-03-31 NOTE — Unmapped (Signed)
Prucalopride ePA sent to MAP Team.

## 2023-04-03 ENCOUNTER — Ambulatory Visit: Admit: 2023-04-03 | Discharge: 2023-04-04 | Payer: PRIVATE HEALTH INSURANCE

## 2023-04-03 DIAGNOSIS — R5383 Other fatigue: Principal | ICD-10-CM

## 2023-04-03 DIAGNOSIS — J310 Chronic rhinitis: Principal | ICD-10-CM

## 2023-04-03 DIAGNOSIS — G4733 Obstructive sleep apnea (adult) (pediatric): Principal | ICD-10-CM

## 2023-04-03 NOTE — Unmapped (Signed)
Otolaryngology New Consultation Visit Note    Reason for Visit:  OSA - does not tolerate CPAP    History of Present Illness    Vincent [facility] is 42 y.o. adult being seen in consultation at the request of Rayvon Char PA for evaluation of OSA. The patient reports that he has had a sleep study and DISE done. He does not sleep well. He talked to Dr. Ralene Ok is working with GI for his regurgitation issues. He has not noticed any weight changes. He still has his tonsils. He has tried an oral appliance mouthguard in the past and did not tolerate it because it hurt his mouth. He has had an inferior turbinate resection and septoplasty in the past. He has been allergy tested and is currently on Xolair. He is also on Protonix for reflux. He has to get his throat opened every few years which he believes makes his reflux worse.     I personally reviewed Rhinology note   Septoplasty done. Here for [facility] eval.     I personally reviewed allergy note   Chronic urticaria on xolair.     I personally reviewed PSG 12/2020  AHI 6      Past Medical History     has a past medical history of Anxiety, Arthritis, Asthma, Autoimmune autonomic neuropathy, Breast cyst, Financial difficulties, Lack of access to transportation, Migraines, Neuromuscular disorder (CMS-HCC), Scleroderma (CMS-HCC), Sleep apnea treated with continuous positive airway pressure (CPAP), and Visual impairment.    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    Current Medications    Current Outpatient Medications   Medication Sig Dispense Refill    albuterol 2.5 mg /3 mL (0.083 %) nebulizer solution Inhale 3 mL (2.5 mg total) by nebulization every six (6) hours as needed for wheezing or shortness of breath. 90 mL 1    azelastine (ASTELIN) 137 mcg (0.1 %) nasal spray Instill 2 sprays into each nostril two (2) times a day as needed for rhinitis. 30 mL 1    cetirizine (ZYRTEC) 10 MG tablet Take 1 tablet (10 mg total) by mouth daily. 30 tablet 12    clindamycin (CLEOCIN T) 1 % lotion Apply 1 Application topically as needed. 60 mL 0    clonazePAM (KLONOPIN) 0.5 MG tablet Take 0.5 tablets (0.25 mg total) by mouth two (2) times a day as needed for anxiety. 30 tablet 0    COVID-19 antigen test ([facility] COVID-19 AG HOME TEST) Kit Test as directed 8 kit PRN    diclofenac sodium (VOLTAREN) 1 % gel Apply 2 g topically four (4) times a day. 100 g 0    emtricitabine-tenofovir alafen (DESCOVY) 200-25 mg tablet Take 1 tablet by mouth daily. 90 tablet 0    famotidine (PEPCID) 40 MG tablet Take 1 tablet (40 mg total) by mouth every evening. 30 tablet 1    gabapentin (NEURONTIN) 250 mg/5 mL oral solution Take 12 mL (600 mg total) by mouth Three (3) times a day. 1080 mL 2    gabapentin (NEURONTIN) 250 mg/5 mL oral solution Take  12 mL (600 mg total) by mouth Three (3) times a day. 1080 mL 1    inhalational spacing device Spcr Use as directed with albuterol and symbicort 1 each 1    ipratropium (ATROVENT) 42 mcg (0.06 %) nasal spray 1-2 sprays in each nostril as needed for drainage, up to 4 time daily. 15 mL 2    metoclopramide (REGLAN) 10 MG tablet Take 1/2 tablet (5 mg total) by mouth Three (3) times a day with a meal. 90 tablet 0    nebulizer and compressor (COMP-AIR NEBULIZER COMPRESSOR) Devi 1 each by Miscellaneous route nightly as needed. 1 each 0    needle, disp, 25 Josealfredo (BD REGULAR BEVEL NEEDLES) 25 Tsugio x 5/8 Ndle For subcutaneous hormone injection. 25 each 0    nifedipine 0.3% lidocaine 1.5% in petrolatum ointment Apply a pea-size amount to anal area 3 times a day 100 g 0    omalizumab (XOLAIR) 300 mg/2 mL auto-injector Inject the contents of 1 pen (300 mg) under the skin every fourteen (14) days. 4 mL 11    omalizumab 75 mg/0.5 mL AtIn Inject the contents of 1 pen (75 mg) under the skin every fourteen (14) days. 1 mL 11    pantoprazole (PROTONIX) 40 MG tablet Take 1 tablet (40 mg total) by mouth daily. 90 tablet 3    prucalopride 2 mg Tab Take 1 tablet (2 mg total) by mouth daily. 90 tablet 0    safety needles (BD ECLIPSE) 25 Keevon x 1 Ndle use for testosterone 12 each 0    safety needles (BD SAFETYGLIDE NEEDLE) 18 Thai x 1 1/2 Ndle For drawing hormone injection 25 each 0    syringe, disposable, (EASY TOUCH LUER LOCK SYRINGE) 1 mL Syrg Use for weekly hormone injection. 4 each 0    syringe, disposable, (EASY TOUCH LUER LOCK SYRINGE) 1 mL Syrg Use for testosterone 12 each 0    syringe, disposable, 2.5 mL Syrg Use weekly 30 Syringe 2    testosterone cypionate (DEPOTESTOTERONE CYPIONATE) 200 mg/mL injection Inject 0.2 mL (40 mg total) into the muscle once a week. 5 mL 0    triamcinolone (KENALOG) 0.1 % cream Apply topically Two (2) times a day. 15 g 0    budesonide-formoterol (SYMBICORT) 160-4.5 mcg/actuation inhaler Inhale 1 puff by mouth Two (2) times a day. 10.2 g 5    EPINEPHrine (EPIPEN) 0.3 mg/0.3 mL injection Inject 0.3 mL (0.3 mg total) under the skin once for 1 dose. 2 each 0     Current Facility-Administered Medications   Medication Dose Route Frequency Provider Last Rate Last Admin    omalizumab [name]) injection 300 mg  300 mg Subcutaneous Q28 Days [name], [name], [name]   300 mg at 05/24/22 1138       Allergies    Allergies   Allergen Reactions    Latex Rash and Hives    Penicillins Anaphylaxis     Pt can tolerate amoxicillin   Has patient had a PCN reaction causing immediate rash, facial/tongue/throat swelling, SOB or lightheadedness with hypotension: no   Has patient had a PCN reaction causing severe rash involving mucus membranes or skin necrosis: no  Has patient had a PCN reaction that required hospitalization: unknown  Has patient had a PCN reaction occurring within the last 10 years: no  If all of the above answers are NO, then may proceed with Cephalosporin use.    Shellfish Containing Products Anaphylaxis    Other Rash     Nifedipine  0.3%, Lidocaine 1.5% in petrolatum ointment caused rash on his Bottom       Family History    family history includes Breast cancer in his maternal grandmother and sister; Cancer in his mother and sister; Cancer (age of onset: 2) in his maternal uncle; Cataracts in his maternal grandmother; Hypertension in his father; Ovarian cancer in his maternal grandmother; Thyroid disease in his mother.    Social History:     reports that he has never smoked. He has never been exposed to tobacco smoke. He has never used smokeless tobacco.   reports that he does not currently use alcohol.   reports that he does not currently use drugs.    Review of Systems    A full 12-system review of systems is documented and negative except as noted in HPI.  Patient intake form reviewed.    Vital Signs  Blood pressure 117/78, pulse 91, temperature 36.7 ??C (98.1 ??F), height 162.6 cm (5' 4), weight 74.2 kg (163 lb 8 oz), last menstrual period 07/20/2017, not currently breastfeeding.    Physical Exam    General: Well-developed, well-nourished. Appropriate, comfortable, and in no apparent distress.  Voice: clear   Head/Face: On external examination there is no obvious asymmetry or scars. On palpation there is no masses within the salivary glands. Cranial nerves V and VII are intact through all distributions.  Eyes: PERRL, EOMI, the conjunctiva are not injected and sclera is non-icteric.  Ears:   Right ear: On external exam, there is no obvious lesions or asymmetry. Hearing is grossly intact.  Left ear: On external exam, there is no obvious lesions or asymmetry.   Hearing is grossly intact.  Nose: No external masses, lesions, or deformity. Anterior rhinoscopy of the nasal mucosa, septum, and turbinates reveals a normal exam.   Oral cavity/oropharynx: The mucosa of the lips, gums, hard and soft palate, posterior pharyngeal wall, tongue, floor of mouth, and buccal region are without masses or lesions and are normally hydrated. Good dentition. Tongue protrudes midline. Tonsils are symmetric without any masses or lesions-1+. Supraglottis not visualized due to gag reflex. Friedman 4.   Neck: There is no asymmetry or masses. Trachea is midline. There is no enlargement of the thyroid or palpable thyroid nodules.   Lymphatics: There is no palpable lymphadenopathy along the jugulodiagastric, submental, or posterior cervical chains.  Chest: No audible wheeze, unlabored respirations.  Neurologic: Cranial nerve???s II-XII are grossly intact. Exam is non-focal.  Extremities: No cyanosis, clubbing or edema.      Outside Medical Records  I personally reviewed patient's medical records from the referring provider.     Assessment/Recommendations:    The patient is a 42 y.o. adult with a history of  has a past medical history of Anxiety, Arthritis, Asthma, Autoimmune autonomic neuropathy, Breast cyst, Financial difficulties, Lack of access to transportation, Migraines, Neuromuscular disorder (CMS-HCC), Scleroderma (CMS-HCC), Sleep apnea treated with continuous positive airway pressure (CPAP), and Visual impairment. who presents for the evaluation of OSA. Patient does not tolerate CPAP.      The pathophysiology and treatment of adult obstructive sleep apnea were discussed in detail. We discussed that untreated obstructive sleep apnea is associated with significant morbidity, mortality, and decreased quality of life. We discussed that in general, adult obstructive sleep apnea is best treated by CPAP, with a dental appliance/mandibular advancement device as a second line of therapy for patients with mild OSA and surgery for mild-severe OSA. We also discussed the importance of weight loss in controlling obstructive  sleep apnea.    Surgery for adult obstructive sleep apnea is typically reserved for inability to tolerate a prolonged trial of these treatments, or when a specific, substantial level of anatomic obstruction is identified. We discussed that in many cases sleep surgery may result in improvement in quality of life and AHI, but sleep apnea may persist and require ongoing medical treatment. Sleep surgery may assist in making the CPAP mask more tolerable and potentially lowering positive airway pressures.     Discussed that there are different types of surgeries such as UPPP, TORS, Tonsillectomy, bariatric surgery (N/A), MMA, hyoid suspension and hypoglossal nerve implant. However, since the patient has an AHI of 6, he is not a great candidate for surgery. Discussed that AHI is so mild there is concern for other causes of fatigue. I will refer him to sleep medicine to investigate what is causing this. I will also refer him to dentistry to try another oral appliance, as I believe he can benefit from this. Referral sent.     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 Xolair should help with this. As for his reflux, I recommended that he continue to follow-up with GI. May want to ask them about gaviscon as option. Patient to f/u with me as needed.      The patient voiced complete understanding of plan as detailed above and is in full agreement.    Scribe's Attestation: Darliss Ridgel, MD obtained and performed the history, physical exam and medical decision making elements that were entered into the chart. Signed by Donzetta Starch, Scribe, on April 03, 2023 at 2:20 PM.

## 2023-04-03 NOTE — Unmapped (Addendum)
[facility]  [address] [place], [place] [address]   [phone]      For [facility] Office Patients   For nursing questions, please call Dr. Brayton Caves nurse, Savilla Turbyfill at [phone].  To schedule surgery, please call Rolm Bookbinder at [phone].   For Financial counseling, [phone] for imaging and surgery estimates   For Financial assistance [phone] (discuss charity care application)   For [facility] radiology scheduling, please call [phone].   To schedule appointments: [phone]   Office fax: [phone].  For Urgent Medical Advice/Triage Nurse during normal business hours (Mon-Fri 8:15am-4:00 pm), please call [phone].       For [facility] Patients   For nursing questions, surgery scheduling issues or appointments, please call [phone].  For [facility] radiology scheduling, please call [phone].  For [facility] estimates, please call [phone].    Office fax: [phone].    [facility] Radiology Scheduling in [place]   [phone]    After Hours for both places   After hours, please call [phone] and ask for the ENT physician on call.      MyChart is for non-urgent messages.  Please allow for 2-3 business days for a response. Please call the clinic for any urgent messages or concerns.

## 2023-04-04 DIAGNOSIS — K3184 Gastroparesis: Principal | ICD-10-CM

## 2023-04-05 NOTE — Unmapped (Signed)
Called patient to advise that he the cost of the Motegrity is $0 and the prescription is being processed. Patient unavailable and nurse left VM advising the prescription will be mailed to him at zero cost.

## 2023-04-06 ENCOUNTER — Ambulatory Visit
Admit: 2023-04-06 | Payer: PRIVATE HEALTH INSURANCE | Attending: Rehabilitative and Restorative Service Providers" | Primary: Rehabilitative and Restorative Service Providers"

## 2023-04-06 ENCOUNTER — Ambulatory Visit
Admit: 2023-04-06 | Discharge: 2023-04-30 | Payer: BLUE CROSS/BLUE SHIELD | Attending: Rehabilitative and Restorative Service Providers" | Primary: Rehabilitative and Restorative Service Providers"

## 2023-04-06 NOTE — Unmapped (Signed)
[facility]AP[facility] SERVICES AT [place]  OUTPATIENT PHYSICAL THERAPY  04/06/2023  Note Type: Treatment Note       Patient Name: Vincent Waters  Date of Birth:02-05-1981  Diagnosis:   Encounter Diagnoses   Name Primary?    Bilateral hip pain Yes    Weakness of left lower extremity     Neck pain on right side        Referring MD:  Dawna Part, *     Date of Onset of Impairment-01/02/2021  Date PT Care Plan Established or Reviewed-01/03/2023  Date PT Treatment Started-01/03/2023   Plan of Care Effective Date:          Assessment/Plan:    Assessment  Assessment details:    I believe Yesena's hips are now in good shape.  He has excellent ROM and strength in both hips, with a slight restriction/pain in right hip flexion/internal rotation (positive FADDIR).  He does have some clicking and locking occasionally in his right hip, but he describes it as being very tolerable and he can live with it.  He really wants to avoid surgery on his hip at this point.  We agreed that we will discharge his hips at this point and return to work on his shoulder, which actually has good ROM and strength, but is still slightly restricted in IR ROM and posterior strength.  He agrees with this plan.  All questions answered.  This patient requires skilled physical therapy services to address the outlined impairments in order to return to his desired level of function.           Impairments: impaired ADLs, impaired sensation, pain, trigger points, decreased strength, hypertonicity, muscular restrictions and postural weakness                      Therapy Goals      Goals:      Short Term Goals:  In 6 weeks:  Goal #1: Patient will be independent with progressively monitored home exercise program.   Baseline/Current: Patient is unfamiliar with home exercise program.     Goal #2: Patient will demonstrate normal palpation for decreased pain and proper biomechanical function.  Baseline/Current: Patient has spasmed left hip musculature.    Goal #3: Patient will report improved sleeping.  Baseline/Current: Patient has difficulty finding a comfortable position to sleep in.    Long Term Goals:  In 12 weeks:  Goal #1: Patient will demonstrate left hip strength within normal limits without pain in order to allow for functional tasks such as symmetrical walking, squatting and negotiating stairs.   Baseline/Current: Patient has painful and limited left hip strength (see manual muscle testing).      Goal #2: Patient will report minimal symptoms with weight bearing activities such as standing, walking and sit<>stand.  Baseline/Current: Patient has pain with these activities.    Goal #3: Patient will report centralization of lower extremity symptoms (pain, tingling, numbness and weakness).  Baseline/Current: Patient has pain and weakness that radiates into his left lower extremity.    Goal #4: Patient's FOTO score will improve from 26 at initial evaluation to >/= 54 at discharge in order to demonstrate a significant improvement in self-reported outcome measures that directly result from his course of physical therapy.   Baseline/Current: FOTO = 26    Plan    Therapy options: will be seen for skilled physical therapy services    Planned therapy interventions: 20560, 20561-Dry Needling 1-2, 3+ areas, 97110-Therapeutic  Exercises, 97112-Neuromuscular Re-education, 97140-Manual Therapy and 97530-Therapeutic Activities      Frequency: 1x week    Duration in weeks: 12    Education provided to: patient.    Education provided: Anatomy, HEP, Symptom management and Treatment options and plan    Education results: needs reinforcement, verbalized good understanding, demonstrates understanding and needs further instruction.    Communication/Consultation: N/A.    Next visit plan:        Flexion based strengthening for back (reduce lordosis), TDN/STM left ER's and Abductors, stretching, left extension hip strengthening    Total Session Time: 45    Treatment rendered today:      Therapeutic Exercise: 45 minutes  Patient Education: Assessment, Plan of Care, Home Exercise Program    Plan details: Access Code: [email or web address]  URL: [email or web address]  Date: 03/14/2023  Prepared by: Christianne Dolin    Exercises  - Supine Double Knee to Chest  - 1 x daily - 7 x weekly - 1 sets - 20 reps - 5 hold  - Supine Posterior Pelvic Tilt  - 1 x daily - 7 x weekly - 1 sets - 20 reps - 5 hold  - Supine Figure 4 Piriformis Stretch (Mirrored)  - 1 x daily - 7 x weekly - 3 sets - 10 reps - 10 hold  - Supine Figure 4 Piriformis Stretch (Mirrored)  - 1 x daily - 7 x weekly - 1 sets - 10 reps - 10 hold  - Supine Sciatic Nerve Glide  - 1 x daily - 7 x weekly - 2 sets - 10 reps - 2-3 hold  - Curl Up with Arms Crossed  - 1 x daily - 7 x weekly - 2 sets - 10 reps - 1 hold  - Supine Dead Bug with Leg Extension  - 1 x daily - 7 x weekly - 2 sets - 10 reps  - Clamshell with Resistance  - 1 x daily - 7 x weekly - 2 sets - 10 reps - 3 hold  - Sidelying Hip Abduction (Mirrored)  - 1 x daily - 7 x weekly - 2 sets - 10 reps  - Hooklying Transversus Abdominis Palpation  - 1 x daily - 7 x weekly - 1 sets - 15 reps - 5 hold  - Supine March  - 1 x daily - 7 x weekly - 2 sets - 10 reps  - Supine Bridge with Leg Extension  - 1 x daily - 7 x weekly - 2 sets - 10 reps - 5 hold      Subjective:     History of Present Condition     Date of onset:  01/02/2021    Date is approximation?: yes    Explanation:  Patient reports symptoms started in 2022.    History of Present Condition/Chief Complaint:  Vincent Waters is a 42 y.o. right hand dominant adult who presents to outpatient physical therapy with bilateral hip pain that began in 2022.  Patient cannot remember any specific traumatic mechanism of injury that may have been the result of his symptoms.  He remembers that he woke up one day and his left hip started aching, and then the right one started hurting.  Left hurts more than the right.  Right hip clicks when he moves it.  He also has central low back pain.  Subjective:  Vincent Waters reports that he's feeling decent.  He is compliant with his Home Exercise Program.  Pain:     Current pain  rating:  0    Progression:  Improved      Objective:     Static Posture Assessment   Shoulders-Elevated and rounded.  Lumbar Spine-Increased lordosis.   Static Posture Comments-Right shoulder elevated and forward.    Palpation   Left Hip   No palpable tenderness to the piriformis.     Right Shoulder   Hypertonic in the upper trapezius.     Right Cervical/Thoracic   Hypertonic in the upper trapezius.     Range of Motion  Hip  Active ROM  Left Hip-Active ROM  Normal active range of motion  Right Hip-Active ROM  Extension: WFL  Abduction: WFL  External rotation (90/90): [facility]  Internal rotation (90/90): WFL  Passive ROM  Left Hip-Passive ROM  Normal passive range of motion  Right Hip-Passive ROM  Extension: WFL  Abduction: WFL  External rotation (90/90): [facility]  Internal rotation (90/90): WFL  Shoulder  Active ROM  Right Shoulder-Active ROM   Flexion: WFL  Extension: WFL  Abduction: WFL  External rotation 90??: WFL  Passive ROM  Right Shoulder-Passive ROM   Flexion: WFL  Abduction: WFL      Slightly restricted right hip flexion.    Joint Mobility  Shoulder  Right Shoulder Joint Mobility    Posterior capsule: hypomobile    Strength  Right Shoulder   Planes of Motion   Flexion: 4+   Extension: 4+   Abduction: 4+   External rotation at 0??: 4+   Internal rotation at 0??: 5 (Assuming full ROM)   Left Hip   Planes of Motion   Flexion: 5  Extension: 5  Abduction: 5  External rotation: 4+  Internal rotation: 5  Right Hip   Planes of Motion   Flexion: 5  Extension: 5  Abduction: 5  External rotation: 5  Internal rotation: 5    Tests   Right Hip   Positive FADIR.                                   I attest that I have reviewed the above information.  Signed: Patton Salles, PT  04/06/2023 4:20 PM

## 2023-04-06 NOTE — Unmapped (Signed)
Called and left voicemail:  Appt date: Monday 04/10/23  Appt time: 0800 am advised to arrive 30 mins prior to appt. @ 0730 am  Physician: Dr Oda Kilts  Location: [phone]   Clinic phone number: [phone]  Driver? no  Advised to call clinic if experiencing COVID symptoms, signs of infection or recent use of antibiotics or steroids.

## 2023-04-09 NOTE — Unmapped (Signed)
PROCEDURE; Right spinal accessory nerve block with pulsed RFA under ultrasound guidance  PRE-PROCEDURE DIAGNOSIS: Spinal accessory neuralgia  POST-PROCEDURE DIAGNOSIS: Same   PERFORMED BY: Dr. Oda Kilts  ASSISTANT: Dr Allena Katz  Anesthesia: Local without IV sedation      BRIEF HISTORY:   Vincent Waters [facility] ongoing pain after decompression/pectoralis minor release on the right for thoracic outlet syndrome by Dr. Gayla Doss in May 2023.  He was first seen by our clinic in March 2023 for diagnostic injections.  His range of motion has an increased postoperatively, but still has tight, spasming pain in the right neck and shoulder.  His primary pain generator includes myofascial pain post-TOS decompression. Patient presents with worsened pain localized to right neck and shoulder area. He denies radiation of pain down his RUE. TPI injections have been helpful but the bulk of the pain is localized to the trapezius. He previously underwent a right-sided spinal accessory nerve block with steroid, which provided 100% relief immediately, but only helped for a few days.      In 12/2022- patient had 1st pulsed RF therapy with good sustained for >3 month or so and now pain and symptoms returning. He returns today for a repeat procedure.      DESCRIPTION OF THE PROCEDURE:   Informed consent was obtained and potential risks discussed including, but not limited to: bleeding, bruising, severe allergic reaction to components of the injection materials, infection, nerve damage, the possibility of no benefit (pain relief) derived from the injection, or in rare occasions worsening of pain. Questions were answered to the patient's satisfaction and the patient wishes to proceed. Alternative options for treatment have previously been discussed and explored with the patient. The patient is not taking antiplatelet or anticoagulation medications and does not have a driver today.    The patient was taken to the procedure room and placed supine . The procedure was performed under real-time ultrasound guidance. The patient's heart rate, blood pressure, and oxygen saturation were continuously monitored throughout the procedure. A timeout was performed and the correct side confirmed with the patient. Initial scout images were performed with the ultrasound device. The area of interest was sterilely prepped with chlorhexidine and sterilely draped. The physician wore a hat, mask and sterile gloves.     On the right, using a 25 Mende needle, the skin and subcutaneous tissues were anesthetized with 0.5% lidocaine. Then, using ultrasound guidance, a 20 Rumaisa 10cm cannula with a 10mm curved active tip was advanced via an in-plane technique (from the posterior aspect of the U/S probe) until the tip was in the superficial fascial plane adjacent to the area of the spinal accessory nerve just posterior to the SCM.  With good impedances, sensory stimulation at 50Hz  was obtained below 1 V. Stimulation of this area reproduced the patients pain. Motor stimulation caused right trapezius activation.  Pulsed RF was performed at 42 degrees celsius for 120 seconds. The cannula was repositioned again near the area of the nerve and motor stimulation was obained and pulsed RF was performed at this location.  After negative aspiration for heme, 5 mL of 2% lidocaine was injected incrementally after negative aspiration through each cannula under ultrasound guidance.   The cannula was then removed, the patient was cleaned, and a bandage applied.     The procedure was not repeated on the contralateral side in the exact same fashion.    The patient did tolerate the procedure well and  there were no apparent complications. All injection sites were sterilely dressed. The patient was discharged after an appropriate period of observation and post procedural education was given.     Pre-procedure pain score was 8/10  Post-procedure pain score was 3/10  Complications: none    DISPO:  F/up in office on 04/18/23.

## 2023-04-10 ENCOUNTER — Ambulatory Visit
Admit: 2023-04-10 | Discharge: 2023-04-11 | Payer: PRIVATE HEALTH INSURANCE | Attending: Anesthesiology | Primary: Anesthesiology

## 2023-04-10 DIAGNOSIS — G894 Chronic pain syndrome: Principal | ICD-10-CM

## 2023-04-10 DIAGNOSIS — G528 Disorders of other specified cranial nerves: Principal | ICD-10-CM

## 2023-04-10 MED ADMIN — lidocaine (XYLOCAINE) 5 mg/mL (0.5 %) injection 50 mL: 50 mL | @ 12:00:00 | Stop: 2023-04-10

## 2023-04-10 MED ADMIN — lidocaine (XYLOCAINE) 20 mg/mL (2 %) injection 10 mL: 10 mL | @ 12:00:00 | Stop: 2023-04-10

## 2023-04-10 NOTE — Unmapped (Signed)
AVS reviewed with patient and he verbalized understanding to all.

## 2023-04-10 NOTE — Unmapped (Signed)
POST PROCEDURE INSTRUCTIONS   You may apply an ice pack 20-30 minutes at a time to the injection site if you experience soreness.     Keep the injection site clean and dry. You make remove the band-aid one day following the procedure.     You may take a shower but AVOID getting in to baths, pools or whirlpools for 48 HOURS AFTER THE PROCEDURE.       ACTIVITY   Refrain from heavy activity for the next 24 to 48 hours. General walking is okay. You may resume your normal activities the day following the procedure.   You may start or resume your individualized exercise program or physical therapy 48 hours after the procedure.  IF YOU'VE HAD TRIGGER POINT INJECTIONS, YOU MAY CONTINUE EXERCISE OR PHYSICAL THERAPY WITHOUT DELAY.  MEDICATIONS   Please note that it is okay to continue other prescribed medications (blood pressure, insulin, water pill, depression/anxiety pill, etc.) as well as other prescribed pain medications such as Neurontin, Lyrical, Celebrex, Ultram, Vicodin, Norco and acetaminophen (Tylenol).   SIDE EFFECTS   Increase in pain during the first 24 to 48 hours.     You might experience:   1. Mild to moderate swelling at the joint.   2. Possible bruising at the injection site.     WHEN TO CALL THE DOCTOR/NURSE   Severe pain, worse or different that the pain you had before the procedure.     Fever or chills.     Redness, or swelling around the injection site.     Call the Pain Management  Procedural nurses [phone],  during normal business hours (7 am-3 pm). If it is AFTER HOURS or during a weekend or holiday, call the hospital operator and ask for the Anesthesia Pain physician on call at [phone].   FOR EMERGENCIES, CALL 911 OR GO TO THE NEAREST HOSPITAL EMERGENCY DEPARTMENT.   ?   TO SCHEDULE APPOINTMENTS OR FOR QUESTIONS RELATED TO MEDICATIONS   Call the Pain Management Clinic at [phone]

## 2023-04-10 NOTE — Unmapped (Signed)
[facility] Specialty and Home Delivery Pharmacy Refill Coordination Note    Vincent Waters, [place]: [birth year]  Phone: [phone] (home) [phone] (work)      All above HIPAA information was verified with patient.         04/09/2023     7:25 PM   Specialty Rx Medication Refill Questionnaire   Which Medications would you like refilled and shipped? Xolair   Please list all current allergies: Latex shellfish   Have you missed any doses in the last 30 days? No   Have you had any changes to your medication(s) since your last refill? No   How many days remaining of each medication do you have at home? 0   If receiving an injectable medication, next injection date is 04/21/2023   Have you experienced any side effects in the last 30 days? No   Please enter the full address (street address, city, state, zip code) where you would like your medication(s) to be delivered to. [address] [place]  [address]   Please specify on which day you would like your medication(s) to arrive. Note: if you need your medication(s) within 3 days, please call the pharmacy to schedule your order at [phone]  04/13/2023   Has your insurance changed since your last refill? No   Would you like a pharmacist to call you to discuss your medication(s)? No   Do you require a signature for your package? (Note: if we are billing Medicare Part B or your order contains a controlled substance, we will require a signature) No         Completed refill call assessment today to schedule patient's medication shipment from the [facility] [phone]).  All relevant notes have been reviewed.       Confirmed patient received a Conservation officer, historic buildings and a Surveyor, mining with first shipment. The patient will receive a drug information handout for each medication shipped and additional FDA Medication Guides as required.         REFERRAL TO PHARMACIST     Referral to the pharmacist: Not needed      [facility]     Shipping address confirmed in Epic.     Delivery Scheduled: Yes, Expected medication delivery date: 04/13/23.     Medication will be delivered via Same Day Courier to the prescription address in Epic WAM.    [facility] Specialty Technician

## 2023-04-13 MED FILL — XOLAIR 75 MG/0.5 ML SUBCUTANEOUS AUTO-INJECTOR: SUBCUTANEOUS | 28 days supply | Qty: 1 | Fill #2

## 2023-04-13 MED FILL — XOLAIR 300 MG/2 ML SUBCUTANEOUS AUTO-INJECTOR: SUBCUTANEOUS | 28 days supply | Qty: 4 | Fill #2

## 2023-04-13 NOTE — Unmapped (Signed)
[facility]AP[facility] SERVICES AT [place]  OUTPATIENT PHYSICAL THERAPY  04/13/2023  Note Type: Treatment Note       Patient Name: Vincent Waters  Date of Birth:1980-08-16  Diagnosis:   Encounter Diagnoses   Name Primary?    Neck pain on right side Yes    Chronic right shoulder pain     Shoulder stiffness, right     Thoracic outlet syndrome        Referring MD:  Dawna Part, *     Date of Onset of Impairment-09/18/2016  Date PT Care Plan Established or Reviewed-11/07/2022  Date PT Treatment Started-05/06/2022   Plan of Care Effective Date:          Assessment/Plan:    Assessment  Assessment details:    Shourya's shoulder has certainly improved since starting physical therapy.  He has good functional ROM and improved strength.  However, he still has a tight posterior capsule, over active shoulder elevators/adductors and abnormal mechanics.  We had an extensive discussion about why this might be involving the nerve and how we are going to move forward with addressing it.  I still expect that if we improve his posterior capsule pliability and rebalance his shoulder musculature, his right upper trapezius will not create this abnormal tension that he has now.  He understands and agrees with this plan.  Reviewed current exercise and advised him to restart some of his previous exercises that he hasn't done in a while.  He does need some verbal cuing for correct exercise performance, but he is able to demonstrate them correctly afterwards.  All questions answered.  This patient requires skilled physical therapy services to address the outlined impairments in order to return to his desired level of function.           Impairments: decreased strength, hypertonicity, pain and postural weakness                      Therapy Goals      Goals:      Short Term Goals: Updated 11/07/2022.   In 6 weeks:  Goal #1: Patient will be independent with progressively monitored home exercise program.   Baseline/Current: Patient is familiar with home exercise program and performs it consistently.     Goal #2: Patient will demonstrate cervical ROM within normal limits with minimal symptoms in order to allow for functional activities such as safe driving and looking before crossing the street.   Baseline/Current: COMPLETE.  Patient now has normal cervical ROM (see ROM testing).     Goal #3: Patient will demonstrate right shoulder range of motion within normal limits with minimal symptoms for proper glenohumeral joint mechanics in order to allow for daily functional tasks.   Baseline/Current: COMPLETE.  Patient has normal right shoulder range of motion (see ROM testing).     Goal #4: Patient will demonstrate improved posture without cuing.  Baseline/Current: PARTIALLY COMPLETE.  Patient has intermittently improved posture with right shoulder still sitting forward and ups.    Long Term Goals: Updated 11/07/2022.   In 12 weeks:  Goal #1: Patient will demonstrate right shoulder strength within normal limits with minimal symptoms in order to allow for pain free lifting and reaching with right upper extremity.   Baseline/Current: NEARLY COMPLETE.  Patient has significantly improved right shoulder strength with residual weakness (see manual muscle testing).     Goal #2: Patient will be able to reach over head with minimal symptoms in order  to place/remove an object over shoulder height in a cabinet.  Baseline/Current: COMPLETE.  Patient can now reach over head without issue.    Goal #3: Patient will return to recreational activities (working out in gym) without limit.  Baseline/Current: PARTIALLY COMPLETE.  Patient has returned to limited weight lifting.    Goal #4: Patient's FOTO score will improve from 34 at initial evaluation to >/= 52 at discharge in order to demonstrate a significant improvement in self-reported outcome measures that directly result from his course of physical therapy.   Baseline/Current: IN PROGRESS.  FOTO = 34    Plan Therapy options: will be seen for skilled physical therapy services    Planned therapy interventions: Body Mechanics Training, Dry Needling, Education - Patient, Home Exercise Program, Manual Therapy, Neuromuscular Re-education, Postural Training, Therapeutic Activities and Therapeutic Exercises      Frequency: 1x week    Duration in weeks: 6    Education provided to: patient.    Education provided: HEP, Estate manager/land agent, Treatment options and plan, Symptom management and Body awareness    Education results: verbalized good understanding, demonstrates understanding and needs further instruction.    Communication/Consultation: N/A.    Next visit plan:        Posterior shoulder/depressor strengthening.    Total Session Time: 45    Treatment rendered today:      Therapeutic Exercise: 45 minutes  Patient Education: Plan of Care and Home Exercise Program Review.   Exercises:  - Bilateral Horizontal Abduction With Band  - Bilateral ER With Band  - Wall Slide  - Sleeper Stretch  - Chair Presses  - Supine Unilateral Pec Stretch  - Side Lying Horizontal Abduction with Scapular Adduction           Plan details: Access Code: [email or web address]  URL: [email or web address]  Date: 04/13/2023  Prepared by: Christianne Dolin    Exercises  - Standing Shoulder Horizontal Abduction with Resistance  - 1 x daily - 7 x weekly - 2 sets - 10 reps - 1-2 hold  - Shoulder External Rotation and Scapular Retraction with Resistance  - 1 x daily - 7 x weekly - 2 sets - 10 reps  - Standing Shoulder flexion Wall Slide (Mirrored)  - 1 x daily - 7 x weekly - 1 sets - 15 reps  - Seated Shoulder Scaption AAROM with Pulley at Side (Mirrored)  - 1 x daily - 7 x weekly - 3 sets - 10 reps  - Seated Upper Trapezius Stretch  - 1 x daily - 7 x weekly - 1 sets - 3 reps - 30 hold  - Standing Shoulder Internal Rotation Stretch with Hands Behind Back (Mirrored)  - 1 x daily - 7 x weekly - 1 sets - 15 reps - 5 hold  - Sleeper Stretch (Mirrored)  - 1 x daily - 7 x weekly - 1 sets - 10 reps - 10 hold  - Sidelying Shoulder Horizontal Abduction  - 1 x daily - 7 x weekly - 2 sets - 10 reps  - Chair Press Ups Forward and Backward  - 1 x daily - 7 x weekly - 1 sets - 10 reps - 5 hold  - Supine Pectoralis Stretch  - 1 x daily - 7 x weekly - 1 sets - 10 reps - 10 hold      Subjective:     History of Present Condition     Date of surgery:  11/16/2021    Date of onset:  09/18/2016  Date is approximation?: yes    Explanation:  Patient reports onset of symptoms in early 2018.       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 Cypert is a 42 y.o. right hand dominant adult who presents to outpatient physical therapy with right neck and shoulder pain that began in early 2018 when he was changing the oil in his car when it fell onto his sternum.  He went to the ER and got an x-ray which showed a sternal fracture.  Since then, he has had persistent issues and nerve pressure in his right neck/shoulder which led him to get a right anterior scalenectomy, middle scalenectomy, first rib resection and right Pectoralis minor release surgery performed on 11/16/2021 by Thora Lance, MD.  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  Subjective:  Mr. Simmonds reports that he had a right accessory nerve RFA for his right upper trapezius spasms.  He feels it twitching.  He has been doing his exercises consistently..  Pain:     Current pain rating:  0  Location:  Right neck/shoulder    Quality:  Tight    Progression:  Improved      Objective:     Static Posture Assessment   Static Posture Comments-Right shoulder elevated and forward.    Palpation     Right Shoulder   Hypertonic in the pectoralis major, pectoralis minor and upper trapezius.     Right Cervical/Thoracic   Hypertonic in the pectoralis major, pectoralis minor and upper trapezius.     Range of Motion  Shoulder  Active ROM  Right Shoulder-Active ROM   Normal active range of motion  Passive ROM  Right Shoulder-Passive ROM   Normal passive range of motion    Joint Mobility  Shoulder  Right Shoulder Joint Mobility    Posterior capsule: hypomobile    Strength  Right Shoulder   Planes of Motion   Flexion: 4   Extension: 4   Abduction: 4+   External rotation at 0??: 4   Internal rotation at 0??: 4+                                   I attest that I have reviewed the above information.  Signed: Patton Salles, PT  04/13/2023 12:54 PM

## 2023-04-17 MED ORDER — GABAPENTIN 250 MG/5 ML ORAL SOLUTION
Freq: Three times a day (TID) | ORAL | 1 refills | 30 days
Start: 2023-04-17 — End: ?

## 2023-04-18 NOTE — Unmapped (Unsigned)
Department of Anesthesiology  [facility]  [address], [address]  [place], [place] [address]  [phone]    Chronic Pain Follow-Up Note  1. Myofascial pain    2. Chronic pain syndrome      Assessment and Plan  Vincent [place] is a 42 y.o. 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 Gayla Doss in May 2023.  He was first seen by our clinic in March 2023 for diagnostic injections.  His range of motion has an increased postoperatively, but still has tight, spasming pain in the right neck and shoulder.  He is currently weaning off benzodiazepines, discontinued opioid medications in late 2023 which he was chronically on for 3 years, and inquires about optimizing medications and procedures to improve his baseline pain at this time.  His primary pain generator includes myofascial pain post-TOS decompression.  He has other medical history that includes anxiety, scleroderma, migraines, autoimmune autonomic neuropathy, asthma.  Also follows with sports medicine and has undergone injections there as well.      October 2024  At last visit in July, the patient presented for post-procedure assessment. He underwent right spinal accessory nerve block with pulsed RFA on 12/29/22, which he endorsed 70% on going relief. He denied new or worsening symptoms. In terms of medications, he took gabapentin 600 mg TID with benefit without any adverse side effect.     Today, ***    - Continue following with PT  - Repeat TPIs PRN, not at the same time as the block  - Continue gabapentin 600 mg oral solution TID, refilled x2, one refill remaining   - Continue clonazepam taper with PCP    We are delivering comprehensive, continuous, longitudinal care for this patient with chronic pain.     Considerations for the future:  - Repeat TPIs PRN  - Repeat spinal accessory nerve block/pulsed RFA  - Oromaxillofacial pain referral if trigeminal neuralgia suspected/ deemed separate from TOS pain   - Consider initiation of Butrans/buprenorphine product after weaning off of benzodiazepines and pain psych evaluation for COM should conservative measures fail, but hoping to avoid if possible  - Consider up titration of gabapentin oral solution or rotation to Lyrica   - Hip MRI without contrast    Urine toxicology: 04/28/22 due today  Urine toxicology screen was appropriate.   Last Opioid Change: Last Rx in 04/28/22 due today  Last EKG: N/A  Previous Compliance Issues: None  Naloxone ordered: no  Total morphine equivalents: 0  Benzodiazepine: Yes. Weaning.  NCCSRS database was reviewed 04/17/23     No follow-ups on file.    No orders of the defined types were placed in this encounter.    Requested Prescriptions     Pending Prescriptions Disp Refills    gabapentin (NEURONTIN) 250 mg/5 mL oral solution 1080 mL 1     Sig: Take 12 mL (600 mg total) by mouth Three (3) times a day.     HPI:  Vincent [place] is seen in consultation at the request of Dr. Gayla Doss for evaluation and recommendations regarding His pain.    Patient was last seen in July. Since then, the patient has followed with Rheumatology, PT, Allergy, and GI. He underwent right spinal accessory nerve block with RFA on 04/10/23.     Today, ***    Current Medications:  Gabapentin 600 mg TID- has been on for  years.  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  Voltaren gel    The patient states his pain is located *** and the severity of his pain ranges from ***/10 to ***/10.  His pain currently is ***/10 and on average is ***/10. He describes the sensation of his pain as {pain described:58928}. His pain is present {pain frequency:58931} and worst {pain worst:59685}. The patient???s pain impacts {negatively affected:59709}. His interval history includes {interval history:59710}. His pain {pain changed:59711}, and he {does does not blank:47747} have new pain to discuss today. He {is/is not:36772} on blood thinners or anti-coagulants. In regards to medications currently taken for pain management, the patient {is:54246::is} tolerating these medications well and complains of associated side effects: {pain med side effects:59712}.    Previous Medication Trials:  NSAIDS- ibuprofen  Antidepressants-   Anticonvulsant- gabapentin  Muscle relaxants- flexeril, baclofen  Topicals-voltaren gel  Short-acting opiates-percocet   Long-acting opiates-   Anxiolytics-   Other-     Previous Interventions  Medications, PT, surgery, injections with sports med  TOS decompression/PMR 11/16/21  TPI-05/10/22-very helpful.  Have repeated several times since  PT-2023  Left GTB CSI-sports med 05/27/22  Spinal accessory nerve block-11/04/22  Spinal accessory nerve block and pulsed RFA-12/29/22    Allergies as of 04/18/2023 - Reviewed 04/13/2023   Allergen Reaction Noted    Latex Rash and Hives 09/16/2016    Penicillins Anaphylaxis 09/16/2016    Shellfish containing products Anaphylaxis 01/23/2017    Other Rash 12/16/2020      Current Outpatient Medications   Medication Sig Dispense Refill    albuterol 2.5 mg /3 mL (0.083 %) nebulizer solution Inhale 3 mL (2.5 mg total) by nebulization every six (6) hours as needed for wheezing or shortness of breath. 90 mL 1    azelastine (ASTELIN) 137 mcg (0.1 %) nasal spray Instill 2 sprays into each nostril two (2) times a day as needed for rhinitis. 30 mL 1    budesonide-formoterol (SYMBICORT) 160-4.5 mcg/actuation inhaler Inhale 1 puff by mouth Two (2) times a day. 10.2 g 5    cetirizine (ZYRTEC) 10 MG tablet Take 1 tablet (10 mg total) by mouth daily. 30 tablet 12    clindamycin (CLEOCIN T) 1 % lotion Apply 1 Application topically as needed. 60 mL 0    clonazePAM (KLONOPIN) 0.5 MG tablet Take 0.5 tablets (0.25 mg total) by mouth two (2) times a day as needed for anxiety. 30 tablet 0    COVID-19 antigen test ([facility] COVID-19 AG HOME TEST) Kit Test as directed 8 kit PRN    diclofenac sodium (VOLTAREN) 1 % gel Apply 2 g topically four (4) times a day. 100 g 0    emtricitabine-tenofovir alafen (DESCOVY) 200-25 mg tablet Take 1 tablet by mouth daily. 90 tablet 0    EPINEPHrine (EPIPEN) 0.3 mg/0.3 mL injection Inject 0.3 mL (0.3 mg total) under the skin once for 1 dose. 2 each 0    famotidine (PEPCID) 40 MG tablet Take 1 tablet (40 mg total) by mouth every evening. 30 tablet 1    gabapentin (NEURONTIN) 250 mg/5 mL oral solution Take 12 mL (600 mg total) by mouth Three (3) times a day. 1080 mL 2    gabapentin (NEURONTIN) 250 mg/5 mL oral solution Take 12 mL (600 mg total) by mouth Three (3) times a day. 1080 mL 1    inhalational spacing device Spcr Use as directed with albuterol and symbicort 1 each 1    ipratropium (ATROVENT) 42 mcg (0.06 %)  nasal spray 1-2 sprays in each nostril as needed for drainage, up to 4 time daily. 15 mL 2    metoclopramide (REGLAN) 10 MG tablet Take 1/2 tablet (5 mg total) by mouth Three (3) times a day with a meal. 90 tablet 0    nebulizer and compressor (COMP-AIR NEBULIZER COMPRESSOR) Devi 1 each by Miscellaneous route nightly as needed. 1 each 0    needle, disp, 25 Andrez (BD REGULAR BEVEL NEEDLES) 25 Elan x 5/8 Ndle For subcutaneous hormone injection. 25 each 0    nifedipine 0.3% lidocaine 1.5% in petrolatum ointment Apply a pea-size amount to anal area 3 times a day 100 g 0    omalizumab (XOLAIR) 300 mg/2 mL auto-injector Inject the contents of 1 pen (300 mg) under the skin every fourteen (14) days. 4 mL 11    omalizumab 75 mg/0.5 mL AtIn Inject the contents of 1 pen (75 mg) under the skin every fourteen (14) days. 1 mL 11    pantoprazole (PROTONIX) 40 MG tablet Take 1 tablet (40 mg total) by mouth daily. 90 tablet 3    prucalopride 2 mg Tab Take 1 tablet (2 mg total) by mouth daily. 90 tablet 0    safety needles (BD ECLIPSE) 25 Criag x 1 Ndle use for testosterone 12 each 0    safety needles (BD SAFETYGLIDE NEEDLE) 18 Kyndal x 1 1/2 Ndle For drawing hormone injection 25 each 0    syringe, disposable, (EASY TOUCH LUER LOCK SYRINGE) 1 mL Syrg Use for weekly hormone injection. 4 each 0    syringe, disposable, (EASY TOUCH LUER LOCK SYRINGE) 1 mL Syrg Use for testosterone 12 each 0    syringe, disposable, 2.5 mL Syrg Use weekly 30 Syringe 2    testosterone cypionate (DEPOTESTOTERONE CYPIONATE) 200 mg/mL injection Inject 0.2 mL (40 mg total) into the muscle once a week. 5 mL 0    triamcinolone (KENALOG) 0.1 % cream Apply topically Two (2) times a day. 15 g 0     Current Facility-Administered Medications   Medication Dose Route Frequency Provider Last Rate Last Admin    omalizumab [name]) injection 300 mg  300 mg Subcutaneous Q28 Days [name], [name], [name]   300 mg at 05/24/22 1138       Imaging/Tests:   MRI RLE 09/14/22  FINDINGS:        Bones: No fractures. No marrow edema or bone lesions.      Right hip: No subluxation. No effusion or intra-articular bodies.  No acetabular retroversion. Femoral head/neck offset is within normal limits.  No high-grade chondral defect. The lateral acetabular labrum appears irregular with questionable chondrolabral cleft.      Other Joints: The visualized lumbar spine, sacroiliac joints, symphysis pubis, and contralateral hip are unremarkable.      Musculature and tendons: No intramuscular edema or fatty atrophy.  Intact gluteus medius and minimus tendons. Intact hamstring, rectus femoris and iliopsoas tendons. Ischiofemoral spaces are within normal limits.      Pelvic Cavity: Intrapelvic contents are unremarkable.      Other: Visualized sciatic nerves are unremarkable.  No bursitis.  No subcutaneous mass or fluid collection.   IMPRESSION  -- Suspected right acetabular labral tear.  Consider MR arthrography for further evaluation.     MRI LLE 09/14/22  FINDINGS:     Evaluation of the bone marrow reveals normal marrow signal throughout the visualized bones of the pelvis, proximal femurs and lower lumbar spine. The musculature of the pelvis is normal and  symmetric.  Tendon origins are normal.  No evidence of bursitis.      The sacroiliac and hip joints are normal.      The sciatic nerves are normal.  Vasculature of the pelvis is normal.      Intrapelvic contents are normal.   IMPRESSION  Normal MRI of the left hip.       Lab Results   Component Value Date    PLT 316 01/19/2023     Lab Results   Component Value Date    CREATININE 0.91 01/19/2023     Lab Results   Component Value Date    ALKPHOS 110 01/19/2023    BILITOT 0.5 01/19/2023    BILIDIR 0.20 01/19/2023    PROT 7.9 01/19/2023    ALBUMIN 4.3 01/19/2023    ALT 10 01/19/2023    AST 18 01/19/2023     ROS:  General {ajlrosgen:46920}  Cardiovascular {aggROSCV:37303}  Gastrointestinal {aggROSGI:37304}  Skin {aggROSskin:37305}  Endocrine {aggROSendo:37306}  Musculoskeletal {aggROSmusk:37307}  Neurologic {aggROSneu:37308}  Psychiatric {aggROSpsych:37309}       PHYSICAL EXAM:  LMP 07/20/2017 (Exact Date)     Wt Readings from Last 3 Encounters:   04/03/23 74.2 kg (163 lb 8 oz)   03/23/23 74.9 kg (165 lb 3.2 oz)   03/10/23 73.9 kg (163 lb)     GENERAL:  The patient is well developed, well-nourished, and appears to be in no apparent distress.   HEAD/NECK:    Normocephalic/atraumatic. clear sclera, pupils not pinpoint  CV:  Warm and well perfused   LUNGS:   Normal work of breathing, no supplemental O2  EXTREMITIES:  No clubbing, cyanosis noted.  NEUROLOGIC:    The patient is alert and oriented, speech fluent, normal language.   MUSCULOSKELETAL:    Motor function  preserved. Good range of motion of all extremities.   GAIT:  The patient rises from a seated position with no difficulty and ambulates with nonantalgic gait without the assistance of a walking aid.   SKIN:   No obvious rashes, lesions, or erythema.  PSY:   Appropriate affect. No overt pain behaviors. No evidence of psychomotor retardation or agitation, no signs of intoxication.

## 2023-04-26 NOTE — Unmapped (Unsigned)
Department of Anesthesiology  [facility]  [address]  [place], [place] [address]  [phone]    Chronic Pain Follow-Up Note  No diagnosis found.    Assessment and Plan  Vin[place] is a 42 y.o. 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 Gayla Doss in May 2023.  He was first seen by our clinic in March 2023 for diagnostic injections.  His range of motion has an increased postoperatively, but still has tight, spasming pain in the right neck and shoulder.  He is currently weaning off benzodiazepines, discontinued opioid medications in late 2023 which he was chronically on for 3 years, and inquires about optimizing medications and procedures to improve his baseline pain at this time.  His primary pain generator includes myofascial pain post-TOS decompression.  He has other medical history that includes anxiety, scleroderma, migraines, autoimmune autonomic neuropathy, asthma.  Also follows with sports medicine and has undergone injections there as well.      November 2024  At last visit in July, the patient presented for post-procedure assessment. He underwent right spinal accessory nerve block with pulsed RFA on 12/29/22, which he endorsed 70% on going relief. He denied new or worsening symptoms. In terms of medications, he took gabapentin 600 mg TID with benefit without any adverse side effect.     Today, ***    - Continue following with PT  - Repeat TPIs PRN, not at the same time as the block  - Continue gabapentin 600 mg oral solution TID, refilled x2, one refill remaining   - Continue clonazepam taper with PCP    We are delivering comprehensive, continuous, longitudinal care for this patient with chronic pain.     Considerations for the future:  - Repeat TPIs PRN  - Repeat spinal accessory nerve block/pulsed RFA  - Oromaxillofacial pain referral if trigeminal neuralgia suspected/ deemed separate from TOS pain   - Consider initiation of Butrans/buprenorphine product after weaning off of benzodiazepines and pain psych evaluation for COM should conservative measures fail, but hoping to avoid if possible  - Consider up titration of gabapentin oral solution or rotation to Lyrica   - Hip MRI without contrast    Urine toxicology: 04/28/22 due today  Urine toxicology screen was appropriate.   Last Opioid Change: Last Rx in 04/28/22 due today  Last EKG: N/A  Previous Compliance Issues: None  Naloxone ordered: no  Total morphine equivalents: 0  Benzodiazepine: Yes. Weaning.  NCCSRS database was reviewed 04/25/23     No follow-ups on file.    No orders of the defined types were placed in this encounter.    Requested Prescriptions      No prescriptions requested or ordered in this encounter     HPI:  Vin[place] is seen in consultation at the request of Dr. Gayla Doss for evaluation and recommendations regarding His pain.    Patient was last seen in July. Since then, the patient has followed with Rheumatology, PT, Allergy, and GI. He underwent right spinal accessory nerve block with RFA on 04/10/23.     Today, ***    Current Medications:  Gabapentin 600 mg TID- has been on for years.  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  Voltaren gel    The patient states his pain is located *** and the severity of his pain ranges from ***/10 to ***/10.  His pain currently is ***/10 and on average is ***/10. He describes the sensation of his pain as {pain described:58928}. His pain is present {pain frequency:58931} and worst {pain worst:59685}. The patient???s pain impacts {negatively affected:59709}. His interval history includes {interval history:59710}. His pain {pain changed:59711}, and he {does does not blank:47747} have new pain to discuss today. He {is/is not:36772} on blood thinners or anti-coagulants. In regards to medications currently taken for pain management, the patient {is:54246::is} tolerating these medications well and complains of associated side effects: {pain med side effects:59712}.    Previous Medication Trials:  NSAIDS- ibuprofen  Antidepressants-   Anticonvulsant- gabapentin  Muscle relaxants- flexeril, baclofen  Topicals-voltaren gel  Short-acting opiates-percocet   Long-acting opiates-   Anxiolytics-   Other-     Previous Interventions  Medications, PT, surgery, injections with sports med  TOS decompression/PMR 11/16/21  TPI-05/10/22-very helpful.  Have repeated several times since  PT-2023  Left GTB CSI-sports med 05/27/22  Spinal accessory nerve block-11/04/22  Spinal accessory nerve block and pulsed RFA-12/29/22    Allergies as of 04/26/2023 - Reviewed 04/13/2023   Allergen Reaction Noted    Latex Rash and Hives 09/16/2016    Penicillins Anaphylaxis 09/16/2016    Shellfish containing products Anaphylaxis 01/23/2017    Other Rash 12/16/2020      Current Outpatient Medications   Medication Sig Dispense Refill    albuterol 2.5 mg /3 mL (0.083 %) nebulizer solution Inhale 3 mL (2.5 mg total) by nebulization every six (6) hours as needed for wheezing or shortness of breath. 90 mL 1    azelastine (ASTELIN) 137 mcg (0.1 %) nasal spray Instill 2 sprays into each nostril two (2) times a day as needed for rhinitis. 30 mL 1    budesonide-formoterol (SYMBICORT) 160-4.5 mcg/actuation inhaler Inhale 1 puff by mouth Two (2) times a day. 10.2 g 5    cetirizine (ZYRTEC) 10 MG tablet Take 1 tablet (10 mg total) by mouth daily. 30 tablet 12    clindamycin (CLEOCIN T) 1 % lotion Apply 1 Application topically as needed. 60 mL 0    clonazePAM (KLONOPIN) 0.5 MG tablet Take 0.5 tablets (0.25 mg total) by mouth two (2) times a day as needed for anxiety. 30 tablet 0    COVID-19 antigen test ([facility] COVID-19 AG HOME TEST) Kit Test as directed 8 kit PRN    diclofenac sodium (VOLTAREN) 1 % gel Apply 2 g topically four (4) times a day. 100 g 0 emtricitabine-tenofovir alafen (DESCOVY) 200-25 mg tablet Take 1 tablet by mouth daily. 90 tablet 0    EPINEPHrine (EPIPEN) 0.3 mg/0.3 mL injection Inject 0.3 mL (0.3 mg total) under the skin once for 1 dose. 2 each 0    famotidine (PEPCID) 40 MG tablet Take 1 tablet (40 mg total) by mouth every evening. 30 tablet 1    gabapentin (NEURONTIN) 250 mg/5 mL oral solution Take 12 mL (600 mg total) by mouth Three (3) times a day. 1080 mL 2    gabapentin (NEURONTIN) 250 mg/5 mL oral solution Take 12 mL (600 mg total) by mouth Three (3) times a day. 1080 mL 1    inhalational spacing device Spcr Use as directed with albuterol and symbicort 1 each 1    ipratropium (ATROVENT) 42 mcg (0.06 %) nasal spray 1-2 sprays in each nostril as needed for drainage, up to 4 time daily. 15 mL 2    metoclopramide (REGLAN) 10 MG tablet Take 1/2 tablet (5 mg total) by mouth Three (3) times  a day with a meal. 90 tablet 0    nebulizer and compressor (COMP-AIR NEBULIZER COMPRESSOR) Devi 1 each by Miscellaneous route nightly as needed. 1 each 0    needle, disp, 25 Roderic (BD REGULAR BEVEL NEEDLES) 25 [place] x 5/8 Ndle For subcutaneous hormone injection. 25 each 0    nifedipine 0.3% lidocaine 1.5% in petrolatum ointment Apply a pea-size amount to anal area 3 times a day 100 g 0    omalizumab (XOLAIR) 300 mg/2 mL auto-injector Inject the contents of 1 pen (300 mg) under the skin every fourteen (14) days. 4 mL 11    omalizumab 75 mg/0.5 mL AtIn Inject the contents of 1 pen (75 mg) under the skin every fourteen (14) days. 1 mL 11    pantoprazole (PROTONIX) 40 MG tablet Take 1 tablet (40 mg total) by mouth daily. 90 tablet 3    prucalopride 2 mg Tab Take 1 tablet (2 mg total) by mouth daily. 90 tablet 0    safety needles (BD ECLIPSE) 25 Kano x 1 Ndle use for testosterone 12 each 0    safety needles (BD SAFETYGLIDE NEEDLE) 18 Tyshaun x 1 1/2 Ndle For drawing hormone injection 25 each 0    syringe, disposable, (EASY TOUCH LUER LOCK SYRINGE) 1 mL Syrg Use for weekly hormone injection. 4 each 0    syringe, disposable, (EASY TOUCH LUER LOCK SYRINGE) 1 mL Syrg Use for testosterone 12 each 0    syringe, disposable, 2.5 mL Syrg Use weekly 30 Syringe 2    testosterone cypionate (DEPOTESTOTERONE CYPIONATE) 200 mg/mL injection Inject 0.2 mL (40 mg total) into the muscle once a week. 5 mL 0    triamcinolone (KENALOG) 0.1 % cream Apply topically Two (2) times a day. 15 g 0     Current Facility-Administered Medications   Medication Dose Route Frequency Provider Last Rate Last Admin    omalizumab [name]) injection 300 mg  300 mg Subcutaneous Q28 Days [name], [name], [name]   300 mg at 05/24/22 1138       Imaging/Tests:   MRI RLE 09/14/22  FINDINGS:        Bones: No fractures. No marrow edema or bone lesions.      Right hip: No subluxation. No effusion or intra-articular bodies.  No acetabular retroversion. Femoral head/neck offset is within normal limits.  No high-grade chondral defect. The lateral acetabular labrum appears irregular with questionable chondrolabral cleft.      Other Joints: The visualized lumbar spine, sacroiliac joints, symphysis pubis, and contralateral hip are unremarkable.      Musculature and tendons: No intramuscular edema or fatty atrophy.  Intact gluteus medius and minimus tendons. Intact hamstring, rectus femoris and iliopsoas tendons. Ischiofemoral spaces are within normal limits.      Pelvic Cavity: Intrapelvic contents are unremarkable.      Other: Visualized sciatic nerves are unremarkable.  No bursitis.  No subcutaneous mass or fluid collection.   IMPRESSION  -- Suspected right acetabular labral tear.  Consider MR arthrography for further evaluation.     MRI LLE 09/14/22  FINDINGS:     Evaluation of the bone marrow reveals normal marrow signal throughout the visualized bones of the pelvis, proximal femurs and lower lumbar spine.      The musculature of the pelvis is normal and symmetric.  Tendon origins are normal.  No evidence of bursitis. The sacroiliac and hip joints are normal.      The sciatic nerves are normal.  Vasculature of the pelvis  is normal.      Intrapelvic contents are normal.   IMPRESSION  Normal MRI of the left hip.       Lab Results   Component Value Date    PLT 316 01/19/2023     Lab Results   Component Value Date    CREATININE 0.91 01/19/2023     Lab Results   Component Value Date    ALKPHOS 110 01/19/2023    BILITOT 0.5 01/19/2023    BILIDIR 0.20 01/19/2023    PROT 7.9 01/19/2023    ALBUMIN 4.3 01/19/2023    ALT 10 01/19/2023    AST 18 01/19/2023     ROS:  General {ajlrosgen:46920}  Cardiovascular {aggROSCV:37303}  Gastrointestinal {aggROSGI:37304}  Skin {aggROSskin:37305}  Endocrine {aggROSendo:37306}  Musculoskeletal {aggROSmusk:37307}  Neurologic {aggROSneu:37308}  Psychiatric {aggROSpsych:37309}       PHYSICAL EXAM:  LMP 07/20/2017 (Exact Date)     Wt Readings from Last 3 Encounters:   04/03/23 74.2 kg (163 lb 8 oz)   03/23/23 74.9 kg (165 lb 3.2 oz)   03/10/23 73.9 kg (163 lb)     GENERAL:  The patient is well developed, well-nourished, and appears to be in no apparent distress.   HEAD/NECK:    Normocephalic/atraumatic. clear sclera, pupils not pinpoint  CV:  Warm and well perfused   LUNGS:   Normal work of breathing, no supplemental O2  EXTREMITIES:  No clubbing, cyanosis noted.  NEUROLOGIC:    The patient is alert and oriented, speech fluent, normal language.   MUSCULOSKELETAL:    Motor function  preserved. Good range of motion of all extremities.   GAIT:  The patient rises from a seated position with no difficulty and ambulates with nonantalgic gait without the assistance of a walking aid.   SKIN:   No obvious rashes, lesions, or erythema.  PSY:   Appropriate affect. No overt pain behaviors. No evidence of psychomotor retardation or agitation, no signs of intoxication.

## 2023-04-28 ENCOUNTER — Ambulatory Visit
Admit: 2023-04-28 | Discharge: 2023-04-29 | Payer: BLUE CROSS/BLUE SHIELD | Attending: Student in an Organized Health Care Education/Training Program | Primary: Student in an Organized Health Care Education/Training Program

## 2023-04-28 MED ORDER — CLONAZEPAM 0.5 MG TABLET
ORAL_TABLET | Freq: Two times a day (BID) | ORAL | 0 refills | 30 days | Status: CP | PRN
Start: 2023-04-28 — End: ?
  Filled 2023-04-28: qty 30, 30d supply, fill #0

## 2023-04-28 NOTE — Unmapped (Addendum)
#   Grief  # Acute stress  # Benzodiazepine dependence  Continues to be under a large amount of stress due to unexpected passing of a number of family members, most recently his cousin who was found unresponsive in his bed. Pt has a good support system including a developing romantic relationship as well as friends. Intermittently speaks with Dr. Matilde Haymaker at [facility] and would like to do this again soon. Has gone back to taking clonazepam 0.25 mg BID (was previously trying to wean down to 0.25 daily) due to this acute increase in his stress level around the election as well as grief around losing so many family members. After discussion of management options, including risks of continued clonazepam use, pt would like to continue clonazepam as currently prescribed (0.25 up to BID PRN) and agrees to psychiatry referral given complex med history and ongoing benzodiazepine dependence. Return precautions provided, pt verbalizes understanding and has no further questions or concerns at this time.   Orders:    Ambulatory referral to Psychiatry; Future    clonazePAM (KLONOPIN) 0.5 MG tablet; Take 0.5 tablets (0.25 mg total) by mouth two (2) times a day as needed for anxiety.

## 2023-04-28 NOTE — Unmapped (Signed)
[facility] at [facility]  Established Patient Clinic Note    Assessment/Plan:   Vincent Waters is a 42 y.o.adult who presents for follow-up.    - Current meds:   Current Outpatient Medications:     albuterol 2.5 mg /3 mL (0.083 %) nebulizer solution, Inhale 3 mL (2.5 mg total) by nebulization every six (6) hours as needed for wheezing or shortness of breath., Disp: 90 mL, Rfl: 1    azelastine (ASTELIN) 137 mcg (0.1 %) nasal spray, Instill 2 sprays into each nostril two (2) times a day as needed for rhinitis., Disp: 30 mL, Rfl: 1    budesonide-formoterol (SYMBICORT) 160-4.5 mcg/actuation inhaler, Inhale 1 puff by mouth Two (2) times a day., Disp: 10.2 g, Rfl: 5    cetirizine (ZYRTEC) 10 MG tablet, Take 1 tablet (10 mg total) by mouth daily., Disp: 30 tablet, Rfl: 12    clindamycin (CLEOCIN T) 1 % lotion, Apply 1 Application topically as needed., Disp: 60 mL, Rfl: 0    COVID-19 antigen test ([facility] COVID-19 AG HOME TEST) Kit, Test as directed, Disp: 8 kit, Rfl: PRN    diclofenac sodium (VOLTAREN) 1 % gel, Apply 2 g topically four (4) times a day., Disp: 100 g, Rfl: 0    emtricitabine-tenofovir alafen (DESCOVY) 200-25 mg tablet, Take 1 tablet by mouth daily., Disp: 90 tablet, Rfl: 0    EPINEPHrine (EPIPEN) 0.3 mg/0.3 mL injection, Inject 0.3 mL (0.3 mg total) under the skin once for 1 dose., Disp: 2 each, Rfl: 0    famotidine (PEPCID) 40 MG tablet, Take 1 tablet (40 mg total) by mouth every evening., Disp: 30 tablet, Rfl: 1    gabapentin (NEURONTIN) 250 mg/5 mL oral solution, Take 12 mL (600 mg total) by mouth Three (3) times a day., Disp: 1080 mL, Rfl: 2    gabapentin (NEURONTIN) 250 mg/5 mL oral solution, Take 12 mL (600 mg total) by mouth Three (3) times a day., Disp: 1080 mL, Rfl: 1    inhalational spacing device Spcr, Use as directed with albuterol and symbicort, Disp: 1 each, Rfl: 1    ipratropium (ATROVENT) 42 mcg (0.06 %) nasal spray, 1-2 sprays in each nostril as needed for drainage, up to 4 time daily., Disp: 15 mL, Rfl: 2    metoclopramide (REGLAN) 10 MG tablet, Take 1/2 tablet (5 mg total) by mouth Three (3) times a day with a meal., Disp: 90 tablet, Rfl: 0    nebulizer and compressor (COMP-AIR NEBULIZER COMPRESSOR) Devi, 1 each by Miscellaneous route nightly as needed., Disp: 1 each, Rfl: 0    needle, disp, 25 Thor (BD REGULAR BEVEL NEEDLES) 25 Tamim x 5/8 Ndle, For subcutaneous hormone injection., Disp: 25 each, Rfl: 0    nifedipine 0.3% lidocaine 1.5% in petrolatum ointment, Apply a pea-size amount to anal area 3 times a day, Disp: 100 g, Rfl: 0    omalizumab (XOLAIR) 300 mg/2 mL auto-injector, Inject the contents of 1 pen (300 mg) under the skin every fourteen (14) days., Disp: 4 mL, Rfl: 11    omalizumab 75 mg/0.5 mL AtIn, Inject the contents of 1 pen (75 mg) under the skin every fourteen (14) days., Disp: 1 mL, Rfl: 11    pantoprazole (PROTONIX) 40 MG tablet, Take 1 tablet (40 mg total) by mouth daily., Disp: 90 tablet, Rfl: 3    prucalopride 2 mg Tab, Take 1 tablet (2 mg total) by mouth daily., Disp: 90 tablet, Rfl: 0    safety needles (BD ECLIPSE)  25 Roxie x 1 Ndle, use for testosterone, Disp: 12 each, Rfl: 0    safety needles (BD SAFETYGLIDE NEEDLE) 18 Ethridge x 1 1/2 Ndle, For drawing hormone injection, Disp: 25 each, Rfl: 0    syringe, disposable, (EASY TOUCH LUER LOCK SYRINGE) 1 mL Syrg, Use for weekly hormone injection., Disp: 4 each, Rfl: 0    syringe, disposable, (EASY TOUCH LUER LOCK SYRINGE) 1 mL Syrg, Use for testosterone, Disp: 12 each, Rfl: 0    syringe, disposable, 2.5 mL Syrg, Use weekly, Disp: 30 Syringe, Rfl: 2    testosterone cypionate (DEPOTESTOTERONE CYPIONATE) 200 mg/mL injection, Inject 0.2 mL (40 mg total) into the muscle once a week., Disp: 5 mL, Rfl: 0    triamcinolone (KENALOG) 0.1 % cream, Apply topically Two (2) times a day., Disp: 15 g, Rfl: 0    clonazePAM (KLONOPIN) 0.5 MG tablet, Take 0.5 tablets (0.25 mg total) by mouth two (2) times a day as needed for anxiety., Disp: 30 tablet, Rfl: 0    Current Facility-Administered Medications:     omalizumab [name]) injection 300 mg, 300 mg, Subcutaneous, Q28 Days, [name], [name], [name], 300 mg at 05/24/22 1138  Assessment & Plan  Generalized anxiety disorder  # Grief  # Acute stress  # Benzodiazepine dependence  Continues to be under a large amount of stress due to unexpected passing of a number of family members, most recently his cousin who was found unresponsive in his bed. Pt has a good support system including a developing romantic relationship as well as friends. Intermittently speaks with Dr. Matilde Haymaker at [facility] and would like to do this again soon. Has gone back to taking clonazepam 0.25 mg BID (was previously trying to wean down to 0.25 daily) due to this acute increase in his stress level around the election as well as grief around losing so many family members. After discussion of management options, including risks of continued clonazepam use, pt would like to continue clonazepam as currently prescribed (0.25 up to BID PRN) and agrees to psychiatry referral given complex med history and ongoing benzodiazepine dependence. Return precautions provided, pt verbalizes understanding and has no further questions or concerns at this time.   Orders:    Ambulatory referral to Psychiatry; Future    clonazePAM (KLONOPIN) 0.5 MG tablet; Take 0.5 tablets (0.25 mg total) by mouth two (2) times a day as needed for anxiety.    Dermoid cyst of upper extremity, unspecified laterality  Has had a 2-3 cm palpable, mobile, superficial mass on his R forearm for over a year, which he states became significantly larger last week before returning spontaneously to its current size. Question dermoid cyst vs dermatofibroma vs other cause. 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 After discussion of eval/management options, pt would like to proceed with [facility] skin/procedure clinic referral for discussion of removal.   Orders:    Ambulatory referral to [facility]; Future    Healthcare maintenance  Orders:    PNEUMOCOCCAL CONJUGATE VACCINE 20-VALENT      HEALTH MAINTENANCE ITEMS STILL DUE:  There are no preventive care reminders to display for this patient.    Follow-up: No follow-ups on file.    Future Appointments   Date Time Provider Department Center   04/28/2023  3:45 PM Seymour Bars, PT PTOT [facility]   05/03/2023  1:30 PM Mebrahtu, Bennetta Laos, FNP ANESPAINMRKT [facility]   05/04/2023 12:15 PM Christianne Dolin  [place], PT PTOT [facility]   05/08/2023 12:30 PM Caid, Letha Cape, MD UNCALLERGET [facility]   05/11/2023 12:15 PM Myer, Sharyn Creamer, PT PT[facility]   05/24/2023 12:15 PM Seymour Bars, PT PTOT [facility]   05/26/2023  8:00 AM Linus Salmons, MD UNCFMFAYS [facility]   05/31/2023 12:15 PM Myer, Sharyn Creamer, PT PTOT [facility]   05/31/2023  2:20 PM Mahoro, Champ Mungo, MD UNCGIMEDET [facility]   06/22/2023  9:00 AM Domingo Cocking, FNP [facility]   07/06/2023 10:20 AM SPCAUD AUDIOLOGIST [facility]   07/06/2023 10:45 AM Kimple, Adam [place], MD [facility]   07/25/2023  8:30 AM Almodovar Sunday Shams, MD [facility]   11/16/2023  9:00 AM Gayla Doss Patrice Paradise, MD HVVSURMMNT [facility]       Subjective   Vincent Waters is a 42 y.o. adult  coming to clinic today for the following issues:    Chief Complaint   Patient presents with    Follow-up     Pneumonia shot and would like handicapped placard form completed     HPI: see above for more information.     I have reviewed the problem list, medications, and allergies and have updated/reconciled them if needed.    Vincent Waters  reports that he has never smoked. He has never been exposed to tobacco smoke. He has never used smokeless tobacco.  Health Maintenance   Topic Date Due    DTaP/Tdap/Td Vaccines (2 - Td or Tdap) 08/19/2027    Lipid Screening 12/26/2027    Pneumococcal Vaccine 0-64  Completed    Hepatitis C Screen  Completed    COVID-19 Vaccine  Completed    Influenza Vaccine  Completed       Objective     VITALS: BP 124/87 (BP Site: L Arm, BP Position: Sitting, BP Cuff Size: Medium)  - Pulse 72  - Temp 36.8 ??C (98.2 ??F) (Oral)  - Wt 72.5 kg (159 lb 12.8 oz)  - LMP 07/20/2017 (Exact Date)  - BMI 27.43 kg/m??     Physical Exam  General: well-appearing, sitting upright in no acute distress  Head: Normocephalic, atraumatic  ENT: No dental trauma noted.   Eyes: conjunctiva normal, non-erythematous, non-icteric, no discharge.  Neck: no thyroid enlargement or masses  Lungs: No increased work of breathing or audible wheezing  Skin: 2-3 cm palpable, mobile, superficial mass on his R forearm. Otherwise warm, dry, no erythema or rash on exposed skin  Musculoskeletal: No visible gait abnormalities  Neurologic: Alert & oriented x 3, no gross sensorimotor abnormalities  Psychiatric: Pleasant, cooperative, good eye contact, appropriate thought processes    Wt Readings from Last 3 Encounters:   04/28/23 72.5 kg (159 lb 12.8 oz)   04/03/23 74.2 kg (163 lb 8 oz)   03/23/23 74.9 kg (165 lb 3.2 oz)      PHQ-9 PHQ-9 TOTAL SCORE   10/22/2020   9:00 AM 6   09/04/2020   9:00 AM 3   08/21/2020   9:00 AM 5   05/22/2020   3:00 PM 9     LABS/IMAGING  I have reviewed pertinent recent labs and imaging in Epic    I personally spent a total of 30 minutes taking care of this patient including time with the patient, pre-visit planning, documentation, reviewing labs, coordinating care, and reviewing documentation from other providers in [facility] Medicine and in other specialties.  This was all done on the day  of the visit.  My (and this clinic's) longitudinal relationship with this patient was instrumental in caring for this patient today including caring for their chronic medical and psychiatric conditions.      Alver Fisher, MD, MPH (he/him)  [facility] at [facility]  [address]   [place], [place] [address]  Phone: [phone]  Fax: [phone]

## 2023-04-29 NOTE — Unmapped (Signed)
[facility] AT [facility] [place]  OUTPATIENT PHYSICAL THERAPY  04/28/2023  Note Type: Treatment Note       Patient Name: Vincent Waters  Date of Birth:Aug 12, 1980  Diagnosis:   Encounter Diagnoses   Name Primary?    Neck pain on right side Yes    Chronic right shoulder pain     Shoulder stiffness, right     Thoracic outlet syndrome      Referring MD:  Dawna Part, *     Date of Onset of Impairment-09/18/2016  Date PT Care Plan Established or Reviewed-11/07/2022  Date PT Treatment Started-05/06/2022   Visit Count: 17  Plan of Care Effective Date:          Assessment/Plan:    Assessment  Assessment details:    Vincent Waters has started to show some real progress with his shoulder strength.  He continues to be limited in his shoulder depressor strength and his shoulder continues to sit in a forward and elevated position.  I suspect that this posture is a combination of muscle imbalance and aberrant motor patterning.  It will be interesting to see if rebalancing his shoulder musculature will allow him to correct that aberrant motor pattern.  We may have to do maintenance TDN to help keep the upper trapezius calm, but that remains to be seen.  He understands this plan.  All questions answered.  This patient requires skilled physical therapy services to address the outlined impairments in order to return to his desired level of function.           Impairments: decreased strength, hypertonicity, pain and postural weakness                      Therapy Goals      Goals:      Short Term Goals: Updated 11/07/2022.   In 6 weeks:  Goal #1: Patient will be independent with progressively monitored home exercise program.   Baseline/Current: Patient is familiar with home exercise program and performs it consistently.     Goal #2: Patient will demonstrate cervical ROM within normal limits with minimal symptoms in order to allow for functional activities such as safe driving and looking before crossing the street. Baseline/Current: COMPLETE.  Patient now has normal cervical ROM (see ROM testing).     Goal #3: Patient will demonstrate right shoulder range of motion within normal limits with minimal symptoms for proper glenohumeral joint mechanics in order to allow for daily functional tasks.   Baseline/Current: COMPLETE.  Patient has normal right shoulder range of motion (see ROM testing).     Goal #4: Patient will demonstrate improved posture without cuing.  Baseline/Current: PARTIALLY COMPLETE.  Patient has intermittently improved posture with right shoulder still sitting forward and ups.    Long Term Goals: Updated 11/07/2022.   In 12 weeks:  Goal #1: Patient will demonstrate right shoulder strength within normal limits with minimal symptoms in order to allow for pain free lifting and reaching with right upper extremity.   Baseline/Current: NEARLY COMPLETE.  Patient has significantly improved right shoulder strength with residual weakness (see manual muscle testing).     Goal #2: Patient will be able to reach over head with minimal symptoms in order to place/remove an object over shoulder height in a cabinet.  Baseline/Current: COMPLETE.  Patient can now reach over head without issue.    Goal #3: Patient will return to recreational activities (working out in gym) without limit.  Baseline/Current:  PARTIALLY COMPLETE.  Patient has returned to limited weight lifting.    Goal #4: Patient's FOTO score will improve from 34 at initial evaluation to >/= 52 at discharge in order to demonstrate a significant improvement in self-reported outcome measures that directly result from his course of physical therapy.   Baseline/Current: IN PROGRESS.  FOTO = 34    Plan    Therapy options: will be seen for skilled physical therapy services    Planned therapy interventions: Body Mechanics Training, Dry Needling, Education - Patient, Home Exercise Program, Manual Therapy, Neuromuscular Re-education, Postural Training, Therapeutic Activities and Therapeutic Exercises      Frequency: 1x week    Duration in weeks: 6    Education provided to: patient.    Education provided: HEP, Estate manager/land agent, Treatment options and plan, Symptom management and Body awareness    Education results: verbalized good understanding, demonstrates understanding and needs further instruction.    Communication/Consultation: N/A.    Next visit plan:        Posterior shoulder/depressor strengthening.    Total Session Time: 45    Treatment rendered today:      Therapeutic Exercise: 30 minutes  Patient Education: Plan of Care and Home Exercise Program.   Exercises:  - Unilateral Shoulder Press Down Working into Extension  - Bent Over Triceps Kick Back  - Supine Skull Crushers  - Overhead Elbow/Cable Extensions    Intramuscular Therapy: 5 minutes  [facility]  was informed of the risks associated with dry needling and he signed consent form.  Patient screened for any contraindications to needling including pregnancy, infection, antibiotic use, anticoagulants use, autoimmune issues, uncontrolled diabetes, and active cancer/treatment and does not have any of these conditions.  DRY NEEDLING FOR RELEASING IDENTIFIED TRIGGER POINTS TO FACILITATE MANUAL THERAPY TECHNIQUES AND ALLOW MUSCLE TO RETURN TO OPTIMAL LENGTH (NO CHARGE).  Right upper Trapezius trigger point with 50mm x .30mm with Pinch/Pull Technique established for safety - Local Twitch Response produced       Plan details: Access Code: [email or web address]  URL: [email or web address]  Date: 04/13/2023  Prepared by: Christianne Dolin    Exercises  - Standing Shoulder Horizontal Abduction with Resistance  - 1 x daily - 7 x weekly - 2 sets - 10 reps - 1-2 hold  - Shoulder External Rotation and Scapular Retraction with Resistance  - 1 x daily - 7 x weekly - 2 sets - 10 reps  - Standing Shoulder flexion Wall Slide (Mirrored)  - 1 x daily - 7 x weekly - 1 sets - 15 reps  - Seated Shoulder Scaption AAROM with Pulley at Side (Mirrored)  - 1 x daily - 7 x weekly - 3 sets - 10 reps  - Seated Upper Trapezius Stretch  - 1 x daily - 7 x weekly - 1 sets - 3 reps - 30 hold  - Standing Shoulder Internal Rotation Stretch with Hands Behind Back (Mirrored)  - 1 x daily - 7 x weekly - 1 sets - 15 reps - 5 hold  - Sleeper Stretch (Mirrored)  - 1 x daily - 7 x weekly - 1 sets - 10 reps - 10 hold  - Sidelying Shoulder Horizontal Abduction  - 1 x daily - 7 x weekly - 2 sets - 10 reps  - Chair Press Ups Forward and Backward  - 1 x daily - 7 x weekly - 1 sets - 10 reps - 5 hold  - Supine Pectoralis Stretch  - 1 x daily -  7 x weekly - 1 sets - 10 reps - 10 hold      Subjective:     History of Present Condition     Date of surgery:  11/16/2021    Date of onset:  09/18/2016    Date is approximation?: yes    Explanation:  Patient reports onset of symptoms in early 2018.       Surgical Procedure:  Other (Right anterior scalenectomy, middle scalenectomy, first rib resection and right Pectoralis minor release)    History of Present Condition/Chief Complaint:  Vincent Waters is a 42 y.o. right hand dominant adult who presents to outpatient physical therapy with right neck and shoulder pain that began in early 2018 when he was changing the oil in his car when it fell onto his sternum.  He went to the ER and got an x-ray which showed a sternal fracture.  Since then, he has had persistent issues and nerve pressure in his right neck/shoulder which led him to get a right anterior scalenectomy, middle scalenectomy, first rib resection and right Pectoralis minor release surgery performed on 11/16/2021 by Thora Lance, MD.  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  Subjective:  Vincent Waters reports that his shoulder is feeling ok.  He has been doing his exercises consistently..  Pain:     Current pain rating:  0  Location:  Right neck/shoulder    Quality:  Tight    Progression:  Improved      Objective:       Palpation     Right Shoulder   Hypertonic in the upper trapezius.     Right Cervical/Thoracic   Hypertonic in the upper trapezius.     Strength  Right Shoulder   Planes of Motion   Flexion: 5   Extension: 4   Abduction: 4+   External rotation at 0??: 4   Internal rotation at 0??: 4+   Isolated Muscles   Lower trapezius: 4   Middle trapezius: 4+                                   I attest that I have reviewed the above information.  Signed: Patton Salles, PT  04/28/2023 6:22 PM

## 2023-05-01 NOTE — Unmapped (Unsigned)
Department of Anesthesiology  [facility]  [address]  [place], [place] [address]  [phone]    Chronic Pain Follow-Up Note  No diagnosis found.    Assessment and Plan  [facility] is a 42 y.o. 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 Gayla Doss in May 2023.  He was first seen by our clinic in March 2023 for diagnostic injections.  His range of motion has an increased postoperatively, but still has tight, spasming pain in the right neck and shoulder.  He is currently weaning off benzodiazepines, discontinued opioid medications in late 2023 which he was chronically on for 3 years, and inquires about optimizing medications and procedures to improve his baseline pain at this time.  His primary pain generator includes myofascial pain post-TOS decompression.  He has other medical history that includes anxiety, scleroderma, migraines, autoimmune autonomic neuropathy, asthma.  Also follows with sports medicine and has undergone injections there as well.      November 2024  At last visit in July, the patient presented for post-procedure assessment. He underwent right spinal accessory nerve block with pulsed RFA on 12/29/22, which he endorsed 70% on going relief. He denied new or worsening symptoms. In terms of medications, he took gabapentin 600 mg TID with benefit without any adverse side effect.     Today, ***    - Continue following with PT  - Repeat TPIs PRN, not at the same time as the block  - Continue gabapentin 600 mg oral solution TID, refilled x2, one refill remaining   - Continue clonazepam taper with PCP    We are delivering comprehensive, continuous, longitudinal care for this patient with chronic pain.     Considerations for the future:  - Repeat TPIs PRN  - Repeat spinal accessory nerve block/pulsed RFA  - Oromaxillofacial pain referral if trigeminal neuralgia suspected/ deemed separate from TOS pain   - Consider initiation of Butrans/buprenorphine product after weaning off of benzodiazepines and pain psych evaluation for COM should conservative measures fail, but hoping to avoid if possible  - Consider up titration of gabapentin oral solution or rotation to Lyrica   - Hip MRI without contrast    Urine toxicology: 04/28/22 due today  Urine toxicology screen was appropriate.   Last Opioid Change: Last Rx in 04/28/22 due today  Last EKG: N/A  Previous Compliance Issues: None  Naloxone ordered: no  Total morphine equivalents: 0  Benzodiazepine: Yes. Weaning.  NCCSRS database was reviewed 05/01/23     No follow-ups on file.    No orders of the defined types were placed in this encounter.    Requested Prescriptions      No prescriptions requested or ordered in this encounter     HPI:  Vincent Waters is seen in consultation at the request of Dr. Gayla Doss for evaluation and recommendations regarding His pain.    Patient was last seen in July. Since then, the patient has followed with Rheumatology, PT, Allergy, and GI. He underwent endoscopy on 03/10/23 and right spinal accessory nerve block with RFA on 04/10/23.     Today, ***    Current Medications:  Gabapentin 600 mg TID- has been on for years.  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  Voltaren gel    The patient states his pain is located *** and the severity of his pain ranges from ***/  10 to ***/10.  His pain currently is ***/10 and on average is ***/10. He describes the sensation of his pain as {pain described:58928}. His pain is present {pain frequency:58931} and worst {pain worst:59685}. The patient???s pain impacts {negatively affected:59709}. His interval history includes {interval history:59710}. His pain {pain changed:59711}, and he {does does not blank:47747} have new pain to discuss today. He {is/is not:36772} on blood thinners or anti-coagulants. In regards to medications currently taken for pain management, the patient {is:54246::is} tolerating these medications well and complains of associated side effects: {pain med side effects:59712}.    Previous Medication Trials:  NSAIDS- ibuprofen  Antidepressants-   Anticonvulsant- gabapentin  Muscle relaxants- flexeril, baclofen  Topicals-voltaren gel  Short-acting opiates-percocet   Long-acting opiates-   Anxiolytics-   Other-     Previous Interventions  Medications, PT, surgery, injections with sports med  TOS decompression/PMR 11/16/21  TPI-05/10/22-very helpful.  Have repeated several times since  PT-2023  Left GTB CSI-sports med 05/27/22  Spinal accessory nerve block-11/04/22  Spinal accessory nerve block and pulsed RFA-12/29/22    Allergies as of 05/03/2023 - Reviewed 04/28/2023   Allergen Reaction Noted    Latex Rash and Hives 09/16/2016    Penicillins Anaphylaxis 09/16/2016    Shellfish containing products Anaphylaxis 01/23/2017    Other Rash 12/16/2020      Current Outpatient Medications   Medication Sig Dispense Refill    albuterol 2.5 mg /3 mL (0.083 %) nebulizer solution Inhale 3 mL (2.5 mg total) by nebulization every six (6) hours as needed for wheezing or shortness of breath. 90 mL 1    azelastine (ASTELIN) 137 mcg (0.1 %) nasal spray Instill 2 sprays into each nostril two (2) times a day as needed for rhinitis. 30 mL 1    budesonide-formoterol (SYMBICORT) 160-4.5 mcg/actuation inhaler Inhale 1 puff by mouth Two (2) times a day. 10.2 g 5    cetirizine (ZYRTEC) 10 MG tablet Take 1 tablet (10 mg total) by mouth daily. 30 tablet 12    clindamycin (CLEOCIN T) 1 % lotion Apply 1 Application topically as needed. 60 mL 0    clonazePAM (KLONOPIN) 0.5 MG tablet Take 0.5 tablets (0.25 mg total) by mouth two (2) times a day as needed for anxiety. 30 tablet 0    COVID-19 antigen test ([facility] COVID-19 AG HOME TEST) Kit Test as directed 8 kit PRN    diclofenac sodium (VOLTAREN) 1 % gel Apply 2 g topically four (4) times a day. 100 g 0    emtricitabine-tenofovir alafen (DESCOVY) 200-25 mg tablet Take 1 tablet by mouth daily. 90 tablet 0    EPINEPHrine (EPIPEN) 0.3 mg/0.3 mL injection Inject 0.3 mL (0.3 mg total) under the skin once for 1 dose. 2 each 0    famotidine (PEPCID) 40 MG tablet Take 1 tablet (40 mg total) by mouth every evening. 30 tablet 1    gabapentin (NEURONTIN) 250 mg/5 mL oral solution Take 12 mL (600 mg total) by mouth Three (3) times a day. 1080 mL 2    gabapentin (NEURONTIN) 250 mg/5 mL oral solution Take 12 mL (600 mg total) by mouth Three (3) times a day. 1080 mL 1    inhalational spacing device Spcr Use as directed with albuterol and symbicort 1 each 1    ipratropium (ATROVENT) 42 mcg (0.06 %) nasal spray 1-2 sprays in each nostril as needed for drainage, up to 4 time daily. 15 mL 2    metoclopramide (REGLAN) 10 MG tablet Take 1/2 tablet (5  mg total) by mouth Three (3) times a day with a meal. 90 tablet 0    nebulizer and compressor (COMP-AIR NEBULIZER COMPRESSOR) Devi 1 each by Miscellaneous route nightly as needed. 1 each 0    needle, disp, 25 [place] (BD REGULAR BEVEL NEEDLES) 25 Alik x 5/8 Ndle For subcutaneous hormone injection. 25 each 0    nifedipine 0.3% lidocaine 1.5% in petrolatum ointment Apply a pea-size amount to anal area 3 times a day 100 g 0    omalizumab (XOLAIR) 300 mg/2 mL auto-injector Inject the contents of 1 pen (300 mg) under the skin every fourteen (14) days. 4 mL 11    omalizumab 75 mg/0.5 mL AtIn Inject the contents of 1 pen (75 mg) under the skin every fourteen (14) days. 1 mL 11    pantoprazole (PROTONIX) 40 MG tablet Take 1 tablet (40 mg total) by mouth daily. 90 tablet 3    prucalopride 2 mg Tab Take 1 tablet (2 mg total) by mouth daily. 90 tablet 0    safety needles (BD ECLIPSE) 25 Rodrigus x 1 Ndle use for testosterone 12 each 0    safety needles (BD SAFETYGLIDE NEEDLE) 18 Shaman x 1 1/2 Ndle For drawing hormone injection 25 each 0    syringe, disposable, (EASY TOUCH LUER LOCK SYRINGE) 1 mL Syrg Use for weekly hormone injection. 4 each 0    syringe, disposable, (EASY TOUCH LUER LOCK SYRINGE) 1 mL Syrg Use for testosterone 12 each 0    syringe, disposable, 2.5 mL Syrg Use weekly 30 Syringe 2    testosterone cypionate (DEPOTESTOTERONE CYPIONATE) 200 mg/mL injection Inject 0.2 mL (40 mg total) into the muscle once a week. 5 mL 0    triamcinolone (KENALOG) 0.1 % cream Apply topically Two (2) times a day. 15 g 0     Current Facility-Administered Medications   Medication Dose Route Frequency Provider Last Rate Last Admin    omalizumab [name]) injection 300 mg  300 mg Subcutaneous Q28 Days [name], [name], [name]   300 mg at 05/24/22 1138       Imaging/Tests:   MRI RLE 09/14/22  FINDINGS:        Bones: No fractures. No marrow edema or bone lesions.      Right hip: No subluxation. No effusion or intra-articular bodies.  No acetabular retroversion. Femoral head/neck offset is within normal limits.  No high-grade chondral defect. The lateral acetabular labrum appears irregular with questionable chondrolabral cleft.      Other Joints: The visualized lumbar spine, sacroiliac joints, symphysis pubis, and contralateral hip are unremarkable.      Musculature and tendons: No intramuscular edema or fatty atrophy.  Intact gluteus medius and minimus tendons. Intact hamstring, rectus femoris and iliopsoas tendons. Ischiofemoral spaces are within normal limits.      Pelvic Cavity: Intrapelvic contents are unremarkable.      Other: Visualized sciatic nerves are unremarkable.  No bursitis.  No subcutaneous mass or fluid collection.   IMPRESSION  -- Suspected right acetabular labral tear.  Consider MR arthrography for further evaluation.     MRI LLE 09/14/22  FINDINGS:     Evaluation of the bone marrow reveals normal marrow signal throughout the visualized bones of the pelvis, proximal femurs and lower lumbar spine.      The musculature of the pelvis is normal and symmetric.  Tendon origins are normal.  No evidence of bursitis.      The sacroiliac and hip joints are normal.  The sciatic nerves are normal.  Vasculature of the pelvis is normal.      Intrapelvic contents are normal.   IMPRESSION  Normal MRI of the left hip.       Lab Results   Component Value Date    PLT 316 01/19/2023     Lab Results   Component Value Date    CREATININE 0.91 01/19/2023     Lab Results   Component Value Date    ALKPHOS 110 01/19/2023    BILITOT 0.5 01/19/2023    BILIDIR 0.20 01/19/2023    PROT 7.9 01/19/2023    ALBUMIN 4.3 01/19/2023    ALT 10 01/19/2023    AST 18 01/19/2023     ROS:  General {ajlrosgen:46920}  Cardiovascular {aggROSCV:37303}  Gastrointestinal {aggROSGI:37304}  Skin {aggROSskin:37305}  Endocrine {aggROSendo:37306}  Musculoskeletal {aggROSmusk:37307}  Neurologic {aggROSneu:37308}  Psychiatric {aggROSpsych:37309}       PHYSICAL EXAM:  LMP 07/20/2017 (Exact Date)     Wt Readings from Last 3 Encounters:   04/28/23 72.5 kg (159 lb 12.8 oz)   04/03/23 74.2 kg (163 lb 8 oz)   03/23/23 74.9 kg (165 lb 3.2 oz)     GENERAL:  The patient is well developed, well-nourished, and appears to be in no apparent distress.   HEAD/NECK:    Normocephalic/atraumatic. clear sclera, pupils not pinpoint  CV:  Warm and well perfused   LUNGS:   Normal work of breathing, no supplemental O2  EXTREMITIES:  No clubbing, cyanosis noted.  NEUROLOGIC:    The patient is alert and oriented, speech fluent, normal language.   MUSCULOSKELETAL:    Motor function  preserved. Good range of motion of all extremities.   GAIT:  The patient rises from a seated position with no difficulty and ambulates with nonantalgic gait without the assistance of a walking aid.   SKIN:   No obvious rashes, lesions, or erythema.  PSY:   Appropriate affect. No overt pain behaviors. No evidence of psychomotor retardation or agitation, no signs of intoxication.

## 2023-05-11 ENCOUNTER — Ambulatory Visit
Admit: 2023-05-11 | Payer: BLUE CROSS/BLUE SHIELD | Attending: Rehabilitative and Restorative Service Providers" | Primary: Rehabilitative and Restorative Service Providers"

## 2023-05-11 NOTE — Unmapped (Signed)
[facility] [place]  OUTPATIENT PHYSICAL THERAPY  05/11/2023  Note Type: Treatment Note       Patient Name: Vincent Waters  Date of Birth:1981-01-14  Diagnosis:   Encounter Diagnoses   Name Primary?    Neck pain on right side Yes    Chronic right shoulder pain      Referring MD:  Dawna Part, *     Date of Onset of Impairment-09/18/2016  Date PT Care Plan Established or Reviewed-11/07/2022  Date PT Treatment Started-05/06/2022   Visit Count: 18  Plan of Care Effective Date:          Assessment/Plan:    Assessment  Assessment details:    Limited manual therapy to keep symptoms low.  Immediately afterwards, he stated he felt much better.  Progressing posterior/depressor shoulder within tolerance.  He struggles for proper form (requires verbal and tactile feedback for correct exercise performance) and fatigues quickly.  Written instructions given for additional strengthening exercises.  He committed to performing them consistently as part of his home exercise program. All questions answered.  This patient requires skilled physical therapy services to address the outlined impairments in order to return to his desired level of function.           Impairments: decreased strength, hypertonicity, pain and postural weakness                      Therapy Goals      Goals:      Short Term Goals: Updated 11/07/2022.   In 6 weeks:  Goal #1: Patient will be independent with progressively monitored home exercise program.   Baseline/Current: Patient is familiar with home exercise program and performs it consistently.     Goal #2: Patient will demonstrate cervical ROM within normal limits with minimal symptoms in order to allow for functional activities such as safe driving and looking before crossing the street.   Baseline/Current: COMPLETE.  Patient now has normal cervical ROM (see ROM testing).     Goal #3: Patient will demonstrate right shoulder range of motion within normal limits with minimal symptoms for proper glenohumeral joint mechanics in order to allow for daily functional tasks.   Baseline/Current: COMPLETE.  Patient has normal right shoulder range of motion (see ROM testing).     Goal #4: Patient will demonstrate improved posture without cuing.  Baseline/Current: PARTIALLY COMPLETE.  Patient has intermittently improved posture with right shoulder still sitting forward and ups.    Long Term Goals: Updated 11/07/2022.   In 12 weeks:  Goal #1: Patient will demonstrate right shoulder strength within normal limits with minimal symptoms in order to allow for pain free lifting and reaching with right upper extremity.   Baseline/Current: NEARLY COMPLETE.  Patient has significantly improved right shoulder strength with residual weakness (see manual muscle testing).     Goal #2: Patient will be able to reach over head with minimal symptoms in order to place/remove an object over shoulder height in a cabinet.  Baseline/Current: COMPLETE.  Patient can now reach over head without issue.    Goal #3: Patient will return to recreational activities (working out in gym) without limit.  Baseline/Current: PARTIALLY COMPLETE.  Patient has returned to limited weight lifting.    Goal #4: Patient's FOTO score will improve from 34 at initial evaluation to >/= 52 at discharge in order to demonstrate a significant improvement in self-reported outcome measures that directly result from his course of physical  therapy.   Baseline/Current: IN PROGRESS.  FOTO = 34    Plan    Therapy options: will be seen for skilled physical therapy services    Planned therapy interventions: Body Mechanics Training, Dry Needling, Education - Patient, Home Exercise Program, Manual Therapy, Neuromuscular Re-education, Postural Training, Therapeutic Activities and Therapeutic Exercises      Frequency: 1x week    Duration in weeks: 6    Education provided to: patient.    Education provided: HEP, Estate manager/land agent, Treatment options and plan, Symptom management and Body awareness    Education results: verbalized good understanding, demonstrates understanding and needs further instruction.    Communication/Consultation: N/A.    Next visit plan:        Posterior shoulder/depressor strengthening.    Total Session Time: 45    Treatment rendered today:      Therapeutic Exercise: 35 minutes  Patient Education: Plan of Care and Home Exercise Program.   Exercises:  - Seated Bilateral Horizontal Abduction With Band: 25 x (L5)  - Seated Bilateral ER With Band: 25 x (L5)  - Prone Horizontal Abduction c Scapular Adduction: 2 x 12, 1 lb  - Y Wall Lift Off: 2 x 10  - Bilateral Shoulder Extension With Band, working past body: 2 x 10 (green/light)  - Unilateral Elbow Extension: 2 x 10 (green/light)    Manual Therapy: 5 minutes  - Pin and Stretch Right Upper Trapezius  Intramuscular Therapy: 5 minutes  Alinda [facility]  was informed of the risks associated with dry needling and he signed consent form.  Patient screened for any contraindications to needling including pregnancy, infection, antibiotic use, anticoagulants use, autoimmune issues, uncontrolled diabetes, and active cancer/treatment and does not have any of these conditions.  DRY NEEDLING FOR RELEASING IDENTIFIED TRIGGER POINTS TO FACILITATE MANUAL THERAPY TECHNIQUES AND ALLOW MUSCLE TO RETURN TO OPTIMAL LENGTH (NO CHARGE).  Right upper Trapezius trigger point with 50mm x .30mm with Pinch/Pull Technique established for safety - Local Twitch Response produced       Plan details: Access Code: [email or web address]  URL: [email or web address]  Date: 05/11/2023  Prepared by: Christianne Dolin    Exercises  - Standing Shoulder Horizontal Abduction with Resistance  - 1 x daily - 7 x weekly - 2 sets - 10 reps - 1-2 hold  - Shoulder External Rotation and Scapular Retraction with Resistance  - 1 x daily - 7 x weekly - 2 sets - 10 reps  - Standing Shoulder flexion Wall Slide (Mirrored)  - 1 x daily - 7 x weekly - 1 sets - 15 reps  - Seated Shoulder Scaption AAROM with Pulley at Side (Mirrored)  - 1 x daily - 7 x weekly - 3 sets - 10 reps  - Seated Upper Trapezius Stretch  - 1 x daily - 7 x weekly - 1 sets - 3 reps - 30 hold  - Standing Shoulder Internal Rotation Stretch with Hands Behind Back (Mirrored)  - 1 x daily - 7 x weekly - 1 sets - 15 reps - 5 hold  - Sleeper Stretch (Mirrored)  - 1 x daily - 7 x weekly - 1 sets - 10 reps - 10 hold  - Sidelying Shoulder Horizontal Abduction  - 1 x daily - 7 x weekly - 2 sets - 10 reps  - Chair Press Ups Forward and Backward  - 1 x daily - 7 x weekly - 1 sets - 10 reps - 5 hold  - Supine Pectoralis Stretch  -  1 x daily - 7 x weekly - 1 sets - 10 reps - 10 hold  - Prone Single Arm Shoulder Horizontal Abduction with Dumbbell - Palm Down  - 1 x daily - 7 x weekly - 2 sets - 10 reps  - Low Trap Setting at Wall  - 1 x daily - 7 x weekly - 2 sets - 10 reps - 5 hold  - Shoulder extension with resistance - Neutral  - 1 x daily - 7 x weekly - 2 sets - 10 reps  - CLX Tricep Pressdowns  - 1 x daily - 7 x weekly - 2 sets - 10 reps      Subjective:     History of Present Condition     Date of surgery:  11/16/2021    Date of onset:  09/18/2016    Date is approximation?: yes    Explanation:  Patient reports onset of symptoms in early 2018.       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 Delaplane is a 42 y.o. right hand dominant adult who presents to outpatient physical therapy with right neck and shoulder pain that began in early 2018 when he was changing the oil in his car when it fell onto his sternum.  He went to the ER and got an x-ray which showed a sternal fracture.  Since then, he has had persistent issues and nerve pressure in his right neck/shoulder which led him to get a right anterior scalenectomy, middle scalenectomy, first rib resection and right Pectoralis minor release surgery performed on 11/16/2021 by Thora Lance, MD.  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  Subjective:  Mr. Dejong reports that things are feeling ok.  He has been sick and had to take a week off the gym.  He's noticed that he is starting to get the radiation pain in his shoulder again.  He has been doing his exercises consistently..  Pain:     Current pain rating:  6  Location:  Right neck/shoulder    Quality:  Throbbing    Progression:  Improved      Objective:       Palpation     Right Shoulder   Hypertonic in the upper trapezius.   Tenderness of the upper trapezius.     Right Cervical/Thoracic   Hypertonic in the upper trapezius.   Tenderness of the upper trapezius.                                   I attest that I have reviewed the above information.  Signed: Patton Salles, PT  05/11/2023 12:10 PM

## 2023-05-12 NOTE — Unmapped (Signed)
The [facility] has made a second and final attempt to reach this patient to refill the following medication:XOLAIR 75 mg/0.5 mL Atin (omalizumab) and XOLAIR 300 mg/2 mL auto-injector (omalizumab) .      We have left voicemails on the following phone numbers: [phone], have sent a text message to the following phone numbers: [phone], and have sent a Mychart questionnaire..    Dates contacted: 05/04/23  and   05/12/23   Last scheduled delivery: 04/13/23     The patient may be at risk of non-compliance with this medication. The patient should call the [facility] at [phone]  Option 4, then Option 3: Allergy, Immunology, Pulmonary, Neurology to refill medication.    [name]   [facility]

## 2023-05-15 NOTE — Unmapped (Signed)
[facility] and [facility] Delivery Pharm[facility] Refill Coordination Note    Vincent Waters, [place]: 1981/01/27  Phone: [phone] (home) [phone] (work)      All above HIPAA information was verified with patient.         05/14/2023     5:44 AM   Specialty Rx Medication Refill Questionnaire   Which Medications would you like refilled and shipped? Xolair   Please list all current allergies: Latex penicillins   Have you missed any doses in the last 30 days? No   Have you had any changes to your medication(s) since your last refill? No   How many days remaining of each medication do you have at home? 0   If receiving an injectable medication, next injection date is 04/21/2023   Have you experienced any side effects in the last 30 days? No   Please enter the full address (street address, city, state, zip code) where you would like your medication(s) to be delivered to. [address] [place]  [address]   Please specify on which day you would like your medication(s) to arrive. Note: if you need your medication(s) within 3 days, please call the pharmacy to schedule your order at [phone]  05/16/2023   Has your insurance changed since your last refill? No   Would you like a pharmacist to call you to discuss your medication(s)? No   Do you require a signature for your package? (Note: if we are billing Medicare Part B or your order contains a controlled substance, we will require a signature) No   Additional Comments: Come up the driveway and tjere is a little house in thr back with numbers [phone] on it . Leave it on the porch.         Completed refill call assessment today to schedule patient's medication shipment from the [facility] [phone]).  All relevant notes have been reviewed.       Confirmed patient received a Conservation officer, historic buildings and a Surveyor, mining with first shipment. The patient will receive a drug information handout for each medication shipped and additional FDA Medication Guides as required.         REFERRAL TO PHARMACIST     Referral to the pharmacist: Not needed      [facility]     Shipping address confirmed in Epic.     Delivery Scheduled: Yes, Expected medication delivery date: 05/16/23 .     Medication will be delivered via Same Day Courier to the prescription address in Epic WAM.    [name]   [facility] Special[facility] and Home Delivery Pharmacy Specialty Technician

## 2023-05-16 MED FILL — XOLAIR 300 MG/2 ML SUBCUTANEOUS AUTO-INJECTOR: SUBCUTANEOUS | 28 days supply | Qty: 4 | Fill #3

## 2023-05-16 MED FILL — XOLAIR 75 MG/0.5 ML SUBCUTANEOUS AUTO-INJECTOR: SUBCUTANEOUS | 28 days supply | Qty: 1 | Fill #3

## 2023-05-22 NOTE — Unmapped (Unsigned)
Department of Anesthesiology  [facility]  [address], [address]  [place], [place] [address]  [phone]    Chronic Pain Follow-Up Note  No diagnosis found.    Assessment and Plan  Vincent [facility] is a 42 y.o. 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 Gayla Doss in May 2023.  He was first seen by our clinic in March 2023 for diagnostic injections.  His range of motion has an increased postoperatively, but still has tight, spasming pain in the right neck and shoulder.  He is currently weaning off benzodiazepines, discontinued opioid medications in late 2023 which he was chronically on for 3 years, and inquires about optimizing medications and procedures to improve his baseline pain at this time.  His primary pain generator includes myofascial pain post-TOS decompression.  He has other medical history that includes anxiety, scleroderma, migraines, autoimmune autonomic neuropathy, asthma.  Also follows with sports medicine and has undergone injections there as well.      December 2024  At last visit in July, the patient presented for post-procedure assessment. He underwent right spinal accessory nerve block with pulsed RFA on 12/29/22, which he endorsed 70% on going relief. He denied new or worsening symptoms. In terms of medications, he took gabapentin 600 mg TID with benefit without any adverse side effect.     Today, ***    - Continue following with PT  - Repeat TPIs PRN, not at the same time as the block  - Continue gabapentin 600 mg oral solution TID, refilled x2, one refill remaining   - Continue clonazepam taper with PCP    We are delivering comprehensive, continuous, longitudinal care for this patient with chronic pain.     Considerations for the future:  - Repeat TPIs PRN  - Repeat spinal accessory nerve block/pulsed RFA  - Oromaxillofacial pain referral if trigeminal neuralgia suspected/ deemed separate from TOS pain   - Consider initiation of Butrans/buprenorphine product after weaning off of benzodiazepines and pain psych evaluation for COM should conservative measures fail, but hoping to avoid if possible  - Consider up titration of gabapentin oral solution or rotation to Lyrica   - Hip MRI without contrast    Urine toxicology: 04/28/22 due today  Urine toxicology screen was appropriate.   Last Opioid Change: Last Rx in 04/28/22 due today  Last EKG: N/A  Previous Compliance Issues: None  Naloxone ordered: no  Total morphine equivalents: 0  Benzodiazepine: Yes. Weaning.  [place] database was reviewed 05/22/23     No follow-ups on file.    No orders of the defined types were placed in this encounter.    Requested Prescriptions      No prescriptions requested or ordered in this encounter     HPI:  Vincent [facility] is seen in consultation at the request of Dr. Gayla Doss for evaluation and recommendations regarding His pain.    Patient was last seen in July. Since then, the patient has followed with Rheumatology, PT, Allergy, and GI. He underwent endoscopy on 03/10/23 and right spinal accessory nerve block with RFA on 04/10/23.     Today, ***    Current Medications:  Gabapentin 600 mg TID- has been on for years.  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  Voltaren gel    The patient states his pain is located *** and the severity of his pain ranges from ***/  10 to ***/10.  His pain currently is ***/10 and on average is ***/10. He describes the sensation of his pain as {pain described:58928}. His pain is present {pain frequency:58931} and worst {pain worst:59685}. The patient???s pain impacts {negatively affected:59709}. His interval history includes {interval history:59710}. His pain {pain changed:59711}, and he {does does not blank:47747} have new pain to discuss today. He {is/is not:36772} on blood thinners or anti-coagulants. In regards to medications currently taken for pain management, the patient {is:54246::is} tolerating these medications well and complains of associated side effects: {pain med side effects:59712}.    Previous Medication Trials:  NSAIDS- ibuprofen  Antidepressants-   Anticonvulsant- gabapentin  Muscle relaxants- flexeril, baclofen  Topicals-voltaren gel  Short-acting opiates-percocet   Long-acting opiates-   Anxiolytics-   Other-     Previous Interventions  Medications, PT, surgery, injections with sports med  TOS decompression/PMR 11/16/21  TPI-05/10/22-very helpful.  Have repeated several times since  PT-2023  Left GTB CSI-sports med 05/27/22  Spinal accessory nerve block-11/04/22  Spinal accessory nerve block and pulsed RFA-12/29/22    Allergies as of 05/23/2023 - Reviewed 05/11/2023   Allergen Reaction Noted    Latex Rash and Hives 09/16/2016    Penicillins Anaphylaxis 09/16/2016    Shellfish containing products Anaphylaxis 01/23/2017    Other Rash 12/16/2020      Current Outpatient Medications   Medication Sig Dispense Refill    albuterol 2.5 mg /3 mL (0.083 %) nebulizer solution Inhale 3 mL (2.5 mg total) by nebulization every six (6) hours as needed for wheezing or shortness of breath. 90 mL 1    azelastine (ASTELIN) 137 mcg (0.1 %) nasal spray Instill 2 sprays into each nostril two (2) times a day as needed for rhinitis. 30 mL 1    budesonide-formoterol (SYMBICORT) 160-4.5 mcg/actuation inhaler Inhale 1 puff by mouth Two (2) times a day. 10.2 g 5    cetirizine (ZYRTEC) 10 MG tablet Take 1 tablet (10 mg total) by mouth daily. 30 tablet 12    clindamycin (CLEOCIN T) 1 % lotion Apply 1 Application topically as needed. 60 mL 0    clonazePAM (KLONOPIN) 0.5 MG tablet Take 0.5 tablets (0.25 mg total) by mouth two (2) times a day as needed for anxiety. 30 tablet 0    COVID-19 antigen test ([facility] COVID-19 AG HOME TEST) Kit Test as directed 8 kit PRN    diclofenac sodium (VOLTAREN) 1 % gel Apply 2 g topically four (4) times a day. 100 g 0    emtricitabine-tenofovir alafen (DESCOVY) 200-25 mg tablet Take 1 tablet by mouth daily. 90 tablet 0    EPINEPHrine (EPIPEN) 0.3 mg/0.3 mL injection Inject 0.3 mL (0.3 mg total) under the skin once for 1 dose. 2 each 0    famotidine (PEPCID) 40 MG tablet Take 1 tablet (40 mg total) by mouth every evening. 30 tablet 1    gabapentin (NEURONTIN) 250 mg/5 mL oral solution Take 12 mL (600 mg total) by mouth Three (3) times a day. 1080 mL 2    gabapentin (NEURONTIN) 250 mg/5 mL oral solution Take 12 mL (600 mg total) by mouth Three (3) times a day. 1080 mL 1    inhalational spacing device Spcr Use as directed with albuterol and symbicort 1 each 1    ipratropium (ATROVENT) 42 mcg (0.06 %) nasal spray 1-2 sprays in each nostril as needed for drainage, up to 4 time daily. 15 mL 2    metoclopramide (REGLAN) 10 MG tablet Take 1/2 tablet (5  mg total) by mouth Three (3) times a day with a meal. 90 tablet 0    nebulizer and compressor (COMP-AIR NEBULIZER COMPRESSOR) Devi 1 each by Miscellaneous route nightly as needed. 1 each 0    needle, disp, 25 Lashone (BD REGULAR BEVEL NEEDLES) 25 Belky x 5/8 Ndle For subcutaneous hormone injection. 25 each 0    nifedipine 0.3% lidocaine 1.5% in petrolatum ointment Apply a pea-size amount to anal area 3 times a day 100 g 0    omalizumab (XOLAIR) 300 mg/2 mL auto-injector Inject the contents of 1 pen (300 mg) under the skin every fourteen (14) days. 4 mL 11    omalizumab 75 mg/0.5 mL AtIn Inject the contents of 1 pen (75 mg) under the skin every fourteen (14) days. 1 mL 11    pantoprazole (PROTONIX) 40 MG tablet Take 1 tablet (40 mg total) by mouth daily. 90 tablet 3    prucalopride 2 mg Tab Take 1 tablet (2 mg total) by mouth daily. 90 tablet 0    safety needles (BD ECLIPSE) 25 Nattaly x 1 Ndle use for testosterone 12 each 0    safety needles (BD SAFETYGLIDE NEEDLE) 18 Smriti x 1 1/2 Ndle For drawing hormone injection 25 each 0    syringe, disposable, (EASY TOUCH LUER LOCK SYRINGE) 1 mL Syrg Use for weekly hormone injection. 4 each 0    syringe, disposable, (EASY TOUCH LUER LOCK SYRINGE) 1 mL Syrg Use for testosterone 12 each 0    syringe, disposable, 2.5 mL Syrg Use weekly 30 Syringe 2    testosterone cypionate (DEPOTESTOTERONE CYPIONATE) 200 mg/mL injection Inject 0.2 mL (40 mg total) into the muscle once a week. 5 mL 0    triamcinolone (KENALOG) 0.1 % cream Apply topically Two (2) times a day. 15 g 0     Current Facility-Administered Medications   Medication Dose Route Frequency Provider Last Rate Last Admin    omalizumab [name]) injection 300 mg  300 mg Subcutaneous Q28 Days [name], [name], [name]   300 mg at 05/24/22 1138       Imaging/Tests:   MRI RLE 09/14/22  FINDINGS:        Bones: No fractures. No marrow edema or bone lesions.      Right hip: No subluxation. No effusion or intra-articular bodies.  No acetabular retroversion. Femoral head/neck offset is within normal limits.  No high-grade chondral defect. The lateral acetabular labrum appears irregular with questionable chondrolabral cleft.      Other Joints: The visualized lumbar spine, sacroiliac joints, symphysis pubis, and contralateral hip are unremarkable.      Musculature and tendons: No intramuscular edema or fatty atrophy.  Intact gluteus medius and minimus tendons. Intact hamstring, rectus femoris and iliopsoas tendons. Ischiofemoral spaces are within normal limits.      Pelvic Cavity: Intrapelvic contents are unremarkable.      Other: Visualized sciatic nerves are unremarkable.  No bursitis.  No subcutaneous mass or fluid collection.   IMPRESSION  -- Suspected right acetabular labral tear.  Consider MR arthrography for further evaluation.     MRI LLE 09/14/22  FINDINGS:     Evaluation of the bone marrow reveals normal marrow signal throughout the visualized bones of the pelvis, proximal femurs and lower lumbar spine.      The musculature of the pelvis is normal and symmetric.  Tendon origins are normal.  No evidence of bursitis.      The sacroiliac and hip joints are normal.  The sciatic nerves are normal.  Vasculature of the pelvis is normal.      Intrapelvic contents are normal.   IMPRESSION  Normal MRI of the left hip.       Lab Results   Component Value Date    PLT 316 01/19/2023     Lab Results   Component Value Date    CREATININE 0.91 01/19/2023     Lab Results   Component Value Date    ALKPHOS 110 01/19/2023    BILITOT 0.5 01/19/2023    BILIDIR 0.20 01/19/2023    PROT 7.9 01/19/2023    ALBUMIN 4.3 01/19/2023    ALT 10 01/19/2023    AST 18 01/19/2023     ROS:  General {ajlrosgen:46920}  Cardiovascular {aggROSCV:37303}  Gastrointestinal {aggROSGI:37304}  Skin {aggROSskin:37305}  Endocrine {aggROSendo:37306}  Musculoskeletal {aggROSmusk:37307}  Neurologic {aggROSneu:37308}  Psychiatric {aggROSpsych:37309}       PHYSICAL EXAM:  LMP 07/20/2017 (Exact Date)     Wt Readings from Last 3 Encounters:   04/28/23 72.5 kg (159 lb 12.8 oz)   04/03/23 74.2 kg (163 lb 8 oz)   03/23/23 74.9 kg (165 lb 3.2 oz)     GENERAL:  The patient is well developed, well-nourished, and appears to be in no apparent distress.   HEAD/NECK:    Normocephalic/atraumatic. clear sclera, pupils not pinpoint  CV:  Warm and well perfused   LUNGS:   Normal work of breathing, no supplemental O2  EXTREMITIES:  No clubbing, cyanosis noted.  NEUROLOGIC:    The patient is alert and oriented, speech fluent, normal language.   MUSCULOSKELETAL:    Motor function  preserved. Good range of motion of all extremities.   GAIT:  The patient rises from a seated position with no difficulty and ambulates with nonantalgic gait without the assistance of a walking aid.   SKIN:   No obvious rashes, lesions, or erythema.  PSY:   Appropriate affect. No overt pain behaviors. No evidence of psychomotor retardation or agitation, no signs of intoxication.

## 2023-05-24 NOTE — Unmapped (Signed)
[facility]  [address]                                  [place], [place] [address]    [phone]    Vincent [facility] did not show for their scheduled Physical Therapy follow-up session.  Patient called late to cancel.  Please contact me if you have any questions or concerns.    Thank you for this referral,     Signed: Patton Salles, PT  05/24/2023 11:49 AM

## 2023-05-25 MED FILL — GABAPENTIN 250 MG/5 ML ORAL SOLUTION: ORAL | 30 days supply | Fill #2

## 2023-05-26 ENCOUNTER — Ambulatory Visit
Admit: 2023-05-26 | Discharge: 2023-05-27 | Payer: BLUE CROSS/BLUE SHIELD | Attending: Student in an Organized Health Care Education/Training Program | Primary: Student in an Organized Health Care Education/Training Program

## 2023-05-26 DIAGNOSIS — H9201 Otalgia, right ear: Principal | ICD-10-CM

## 2023-05-26 DIAGNOSIS — G4453 Primary thunderclap headache: Principal | ICD-10-CM

## 2023-05-26 MED ORDER — PROPRANOLOL 10 MG TABLET
ORAL_TABLET | Freq: Two times a day (BID) | ORAL | 11 refills | 30 days | Status: CP
Start: 2023-05-26 — End: 2024-05-25
  Filled 2023-05-30: qty 60, 30d supply, fill #0

## 2023-05-26 NOTE — Unmapped (Signed)
[facility]  Established Patient Clinic Note    Assessment/Plan:   Vincent [facility] is a 42 y.o.adult who presents for follow-up.    Problem List Items Addressed This Visit    None  Visit Diagnoses         Thunderclap headache    -  Primary          - Current meds:   Current Outpatient Medications:     albuterol 2.5 mg /3 mL (0.083 %) nebulizer solution, Inhale 3 mL (2.5 mg total) by nebulization every six (6) hours as needed for wheezing or shortness of breath., Disp: 90 mL, Rfl: 1    azelastine (ASTELIN) 137 mcg (0.1 %) nasal spray, Instill 2 sprays into each nostril two (2) times a day as needed for rhinitis., Disp: 30 mL, Rfl: 1    budesonide-formoterol (SYMBICORT) 160-4.5 mcg/actuation inhaler, Inhale 1 puff by mouth Two (2) times a day., Disp: 10.2 g, Rfl: 5    cetirizine (ZYRTEC) 10 MG tablet, Take 1 tablet (10 mg total) by mouth daily., Disp: 30 tablet, Rfl: 12    clindamycin (CLEOCIN T) 1 % lotion, Apply 1 Application topically as needed., Disp: 60 mL, Rfl: 0    clonazePAM (KLONOPIN) 0.5 MG tablet, Take 0.5 tablets (0.25 mg total) by mouth two (2) times a day as needed for anxiety., Disp: 30 tablet, Rfl: 0    COVID-19 antigen test ([facility] COVID-19 AG HOME TEST) Kit, Test as directed, Disp: 8 kit, Rfl: PRN    diclofenac sodium (VOLTAREN) 1 % gel, Apply 2 g topically four (4) times a day., Disp: 100 g, Rfl: 0    emtricitabine-tenofovir alafen (DESCOVY) 200-25 mg tablet, Take 1 tablet by mouth daily., Disp: 90 tablet, Rfl: 0    EPINEPHrine (EPIPEN) 0.3 mg/0.3 mL injection, Inject 0.3 mL (0.3 mg total) under the skin once for 1 dose., Disp: 2 each, Rfl: 0    famotidine (PEPCID) 40 MG tablet, Take 1 tablet (40 mg total) by mouth every evening., Disp: 30 tablet, Rfl: 1    gabapentin (NEURONTIN) 250 mg/5 mL oral solution, Take 12 mL (600 mg total) by mouth Three (3) times a day., Disp: 1080 mL, Rfl: 2    gabapentin (NEURONTIN) 250 mg/5 mL oral solution, Take 12 mL (600 mg total) by mouth Three (3) times a day., Disp: 1080 mL, Rfl: 1    inhalational spacing device Spcr, Use as directed with albuterol and symbicort, Disp: 1 each, Rfl: 1    ipratropium (ATROVENT) 42 mcg (0.06 %) nasal spray, 1-2 sprays in each nostril as needed for drainage, up to 4 time daily., Disp: 15 mL, Rfl: 2    metoclopramide (REGLAN) 10 MG tablet, Take 1/2 tablet (5 mg total) by mouth Three (3) times a day with a meal., Disp: 90 tablet, Rfl: 0    nebulizer and compressor (COMP-AIR NEBULIZER COMPRESSOR) Devi, 1 each by Miscellaneous route nightly as needed., Disp: 1 each, Rfl: 0    needle, disp, 25 Dan (BD REGULAR BEVEL NEEDLES) 25 Aysel x 5/8 Ndle, For subcutaneous hormone injection., Disp: 25 each, Rfl: 0    nifedipine 0.3% lidocaine 1.5% in petrolatum ointment, Apply a pea-size amount to anal area 3 times a day, Disp: 100 g, Rfl: 0    omalizumab (XOLAIR) 300 mg/2 mL auto-injector, Inject the contents of 1 pen (300 mg) under the skin every fourteen (14) days., Disp: 4 mL, Rfl: 11    omalizumab 75 mg/0.5 mL AtIn, Inject the  contents of 1 pen (75 mg) under the skin every fourteen (14) days., Disp: 1 mL, Rfl: 11    pantoprazole (PROTONIX) 40 MG tablet, Take 1 tablet (40 mg total) by mouth daily., Disp: 90 tablet, Rfl: 3    prucalopride 2 mg Tab, Take 1 tablet (2 mg total) by mouth daily., Disp: 90 tablet, Rfl: 0    safety needles (BD ECLIPSE) 25 Leonda x 1 Ndle, use for testosterone, Disp: 12 each, Rfl: 0    safety needles (BD SAFETYGLIDE NEEDLE) 18 Bethanny x 1 1/2 Ndle, For drawing hormone injection, Disp: 25 each, Rfl: 0    syringe, disposable, (EASY TOUCH LUER LOCK SYRINGE) 1 mL Syrg, Use for weekly hormone injection., Disp: 4 each, Rfl: 0    syringe, disposable, (EASY TOUCH LUER LOCK SYRINGE) 1 mL Syrg, Use for testosterone, Disp: 12 each, Rfl: 0    syringe, disposable, 2.5 mL Syrg, Use weekly, Disp: 30 Syringe, Rfl: 2    testosterone cypionate (DEPOTESTOTERONE CYPIONATE) 200 mg/mL injection, Inject 0.2 mL (40 mg total) into the muscle once a week., Disp: 5 mL, Rfl: 0    triamcinolone (KENALOG) 0.1 % cream, Apply topically Two (2) times a day., Disp: 15 g, Rfl: 0    Current Facility-Administered Medications:     omalizumab [name]) injection 300 mg, 300 mg, Subcutaneous, Q28 Days, [name], [name], [name], 300 mg at 05/24/22 1138  Assessment & Plan  Thunderclap headache  # Chronic nasal congestion  # Right ear pain  For the past month or so, pt has experienced a sudden, intermittent, 10/10 headache he describes as pulsing and like a thunderstorm, multiple times throughout the day, located behind his right eye and extending through right frontal region. States they have been worsening rather than improving over the past month. Denies associated vision changes or acute hearing changes, but does endorse a similar pulsatile pain in his right ear during these episodes; of note, he has experienced reduced hearing in his right ear for which he is seeing an audiologist in January, but this predated his current symptoms. Does not endorse any additional sensorimotor deficits. Has a remote history of migraines but says this feels different. Also has a history of chronic nasal congestion for which he follows with ENT. Exam including CN exam and otoscopic exam unremarkable. Discussed differential including aneurysm, migraines, sinus headaches, tension headaches, and cluster headaches. Given the severity and quality of these headaches, discussed obtaining a CTA head to rule out aneurysm; pt agrees with this plan. Also discussed starting propanolol 10 mg BID for migraine prophylaxis as pt was previously prescribed this without issue; he agrees with this as well. If CTA head negative I will follow up with his ENT to discuss more expedited follow-up. Strict ER precautions provided, pt verbalizes understanding, is in agreement with plan and has no further questions or concerns.   Orders:    CTA Head W Contrast; Future    propranolol (INDERAL) 10 MG tablet; Take 1 tablet (10 mg total) by mouth two (2) times a day.    HEALTH MAINTENANCE ITEMS STILL DUE:  There are no preventive care reminders to display for this patient.  Follow-up: No follow-ups on file.    Future Appointments   Date Time Provider Department Center   05/31/2023 12:15 PM Seymour Bars, PT PTOT [facility]   05/31/2023  2:20 PM Mahoro, Champ Mungo, MD Ranae Pila [facility]   06/19/2023 12:30 PM Caid, Letha Cape, MD UNCALLERGET [facility]   06/22/2023  9:00 AM  Domingo Cocking, FNP [facility]   06/28/2023  8:30 AM Galen Daft, FNP ANESPAINMRKT [facility]   07/06/2023 10:20 AM SPCAUD AUDIOLOGIST [facility]   07/06/2023 10:45 AM Kimple, Adam [place], MD [facility]   07/25/2023  8:30 AM Almodovar Sunday Shams, MD [facility]   08/15/2023  2:00 PM Rayala, Kandis Cocking, MD [facility]   11/16/2023  9:00 AM Thora Lance, MD HVVSURMMNT [facility]       Subjective   Vincent [facility] is a 42 y.o. adult  coming to clinic today for the following issues:    No chief complaint on file.    HPI: see above for more information.     I have reviewed the problem list, medications, and allergies and have updated/reconciled them if needed.    Vincent [facility]  reports that he has never smoked. He has never been exposed to tobacco smoke. He has never used smokeless tobacco.  Health Maintenance   Topic Date Due    DTaP/Tdap/Td Vaccines (2 - Td or Tdap) 08/19/2027    Lipid Screening  12/26/2027    Pneumococcal Vaccine 0-64  Completed    Hepatitis C Screen  Completed    COVID-19 Vaccine  Completed    Influenza Vaccine  Completed       Objective     VITALS: LMP 07/20/2017 (Exact Date)     Physical Exam  General: well-appearing, sitting upright in no acute distress  Head: Normocephalic, atraumatic  ENT: No dental trauma noted. Otoscopic exam wnl.   Eyes: conjunctiva normal, non-erythematous, non-icteric, no discharge. EOMI, PERRLA.  Neck: no thyroid enlargement or masses  Lungs: CTAB, no increased work of breathing or audible wheezing  CV: RRR, no MRG  Skin: Warm, dry, no erythema or rash on exposed skin  Musculoskeletal: No visible gait abnormalities  Neurologic: Alert & oriented x 3, no gross sensorimotor abnormalities. CN intact bilaterally.   Psychiatric: Pleasant, cooperative, good eye contact, appropriate thought processes    Wt Readings from Last 3 Encounters:   04/28/23 72.5 kg (159 lb 12.8 oz)   04/03/23 74.2 kg (163 lb 8 oz)   03/23/23 74.9 kg (165 lb 3.2 oz)      PHQ-9 PHQ-9 TOTAL SCORE   10/22/2020   9:00 AM 6   09/04/2020   9:00 AM 3   08/21/2020   9:00 AM 5   05/22/2020   3:00 PM 9     LABS/IMAGING  I have reviewed pertinent recent labs and imaging in Epic    I personally spent a total of 30 minutes taking care of this patient including time with the patient, pre-visit planning, documentation, reviewing labs, coordinating care, and reviewing documentation from other providers in [facility] and in other specialties.  This was all done on the day of the visit.  My (and this clinic's) longitudinal relationship with this patient was instrumental in caring for this patient today including caring for their chronic medical and psychiatric conditions.      Alver Fisher, MD, MPH (he/him)  [facility]  [address]   [place], [place] [address]  Phone: [phone]  Fax: [phone]

## 2023-05-26 NOTE — Unmapped (Signed)
Hi Vincent [facility],    It was great to see you today. Please call [facility] at [phone] to schedule your head CT. Ideally this will be done within the next few days. In the meantime, if the headache returns and does not resolve after a few minutes as usual, or if you develop any vision changes, chest pain, shortness of breath, dizziness or lightheadedness, or any other new or unusual symptoms associated with the headaches, please go to the ER for emergent evaluation.     I look forward to seeing you at your next visit. In the meantime, please feel free to message me on mychart with any non-urgent questions or concerns (for example, refill requests for a long-term medication, or inquiries regarding vaccine availability). I do my best to respond to messages within 2 business days, however occasionally it may take longer depending on my other clinical duties.     For time-sensitive and/or new clinical concerns (including evaluation of new symptoms, medication adjustments, time-sensitive referral requests, or completion of disability or pre-employment paperwork), please call [phone] to request a phone, video, or in-person visit with me or one of my colleagues, so that we may address your concern as safely and expediently as possible.     Additionally, [facility] has an Urgent Care at our [facility] location! Call [phone] for same-day appointments, available Monday-Friday 7am-8pm; Saturday and Sunday 12pm-5pm. If you think you are having an emergency, please call 911 or go to your nearest emergency department.     If you ever need urgent help with your mental health, including for any thoughts of self-harm or suicide, please call the [facility] mental health crisis line at [phone], or the national suicide prevention hotline at 865-631-0290.     Take care,    Claudie Revering, MD, MPH (he/him)  [facility]  [address]   [place], [place] [address]  Phone: [phone]  Fax: [phone]

## 2023-05-31 ENCOUNTER — Ambulatory Visit
Admit: 2023-05-31 | Discharge: 2023-06-01 | Payer: BLUE CROSS/BLUE SHIELD | Attending: Student in an Organized Health Care Education/Training Program | Primary: Student in an Organized Health Care Education/Training Program

## 2023-05-31 ENCOUNTER — Ambulatory Visit
Admit: 2023-05-31 | Payer: BLUE CROSS/BLUE SHIELD | Attending: Rehabilitative and Restorative Service Providers" | Primary: Rehabilitative and Restorative Service Providers"

## 2023-05-31 DIAGNOSIS — K5909 Other constipation: Principal | ICD-10-CM

## 2023-05-31 DIAGNOSIS — K3184 Gastroparesis: Principal | ICD-10-CM

## 2023-05-31 NOTE — Unmapped (Signed)
[facility]ICINE [place]  [address]  [place] [address]      ESTABLISHED CONSULTATION CLINIC VISIT    REFERRING PROVIDER: Soyla Murphy, MD  [address]  [place],  [place] [address]    PRIMARY CARE PROVIDER: Linus Salmons, MD    ASSESSMENT AND PLAN:     Vincent Waters is a 42 y.o. adult established patient with past medical history significant for gastroparesis, constipation, dysphagia, throracic outlet syndrome, prior cholecystectomy, ADHD presenting for follow up of constipation and regurgitation.     Chronic constipation  Gastroparesis  Doing really well on 5mg  reglan tid. He has a history of TD so will trial lowest effective dose for a week every 3 months. Continue motegrity for constipation. Really motivated and is exercising regularly which has helped his GI motility. Also doing small frequent meals and avoiding leafy greens.    Prior workup:  EGD 02/2023 with LA grade A esophagitis and gastritis (esophageal and stomach bx normal).   GES 02/2023 with delayed gastric emptying (45% retained food at 4hrs).  AXR 04/2022: No significant stool burden  Has had 2 EGDs, most recently 07/2019 with 3cm hiatal hernia and significant gastroparesis with retained food, examination c/b laryngospasm.   GES in 2019 with 22% retention at 4 hours (unclear if he was on narcotics at that time)  Colonoscopy in 2017 was normal.   TSH and A1c normal.   He has elevated ANA 1:640 and was evaluated by rheumatology without concern for systemic connective tissue disease.     Return in about 3 months (around 08/29/2023).    Vincent Soho, MD  Clinical Assistant Professor of Medicine  [employer] [facility]  Division of Gastroenterology & Hepatology     SUBJECTIVE:     CHIEF COMPLAINT: Regurgitation    HISTORY OF PRESENT ILLNESS:  42 y.o. adult new patient with past medical history significant for gastroparesis, constipation, dysphagia, throracic outlet syndrome, prior cholecystectomy, ADHD presenting for follow up of constipation and regurgitation.     EGD 02/2023 with LA grade A esophagitis and gastritis (esophageal and stomach bx normal). GES 02/2023 with delayed gastric emptying (45% retained food at 4hrs). Started on reglan and motegrity. He has been doing well on reglan and motegrity. No emesis or regurgitation. Also having regular BM. Exercising regularly which helps. Avoiding leafy greens. Overall doing much better.     REVIEW OF SYSTEMS:   The balance of 12 systems reviewed is negative except as noted in the history of present illness.    PROVIDER REVIEWED WITH PATIENT AND UPDATED IN EMR:        OBJECTIVE:   VITAL SIGNS: BP 104/63 (BP Site: L Arm, BP Position: Sitting, BP Cuff Size: Medium)  - Pulse 65  - Temp 36 ??C (96.8 ??F) (Temporal)  - Ht 162.6 cm (5' 4)  - Wt 71 kg (156 lb 9.6 oz)  - LMP 07/20/2017 (Exact Date)  - BMI 26.88 kg/m??     Wt Readings from Last 5 Encounters:   05/31/23 71 kg (156 lb 9.6 oz)   05/26/23 72 kg (158 lb 11.2 oz)   04/28/23 72.5 kg (159 lb 12.8 oz)   04/03/23 74.2 kg (163 lb 8 oz)   03/23/23 74.9 kg (165 lb 3.2 oz)       PHYSICAL EXAM:  General appearance: Appears well, no distress.  Pulmonary: Normal work of breathing. Acyanotic.  Abdominal: soft, nontender, nondistended, no masses or organomegaly.  Skin: No jaundice. No rashes.  Neurologic: Alert, oriented, and appropriate.  Psychiatric: Appropriate.

## 2023-05-31 NOTE — Unmapped (Addendum)
[facility]AP[facility] SERVICES AT [place]  OUTPATIENT PHYSICAL THERAPY  05/31/2023  Note Type: Treatment Note       Patient Name: Vincent Waters  Date of Birth:[birth year]  Diagnosis:   Encounter Diagnoses   Name Primary?    Neck pain on right side Yes    Chronic right shoulder pain      Referring MD:  Dawna Part, *     Date of Onset of Impairment-09/18/2016  Date PT Care Plan Established or Reviewed-11/07/2022  Date PT Treatment Started-05/06/2022   Visit Count: 19  Plan of Care Effective Date:          Assessment/Plan:    Assessment  Assessment details:    Limited manual therapy to keep symptoms low.  Progressing posterior/depressor shoulder within tolerance.  He struggles for proper form (requires verbal and tactile feedback for correct exercise performance) and fatigues quickly.  All questions answered.  This patient requires skilled physical therapy services to address the outlined impairments in order to return to his desired level of function.           Impairments: decreased strength, hypertonicity, pain and postural weakness                      Therapy Goals      Goals:      Short Term Goals: Updated 11/07/2022.   In 6 weeks:  Goal #1: Patient will be independent with progressively monitored home exercise program.   Baseline/Current: Patient is familiar with home exercise program and performs it consistently.     Goal #2: Patient will demonstrate cervical ROM within normal limits with minimal symptoms in order to allow for functional activities such as safe driving and looking before crossing the street.   Baseline/Current: COMPLETE.  Patient now has normal cervical ROM (see ROM testing).     Goal #3: Patient will demonstrate right shoulder range of motion within normal limits with minimal symptoms for proper glenohumeral joint mechanics in order to allow for daily functional tasks.   Baseline/Current: COMPLETE.  Patient has normal right shoulder range of motion (see ROM testing).     Goal #4: Patient will demonstrate improved posture without cuing.  Baseline/Current: PARTIALLY COMPLETE.  Patient has intermittently improved posture with right shoulder still sitting forward and ups.    Long Term Goals: Updated 11/07/2022.   In 12 weeks:  Goal #1: Patient will demonstrate right shoulder strength within normal limits with minimal symptoms in order to allow for pain free lifting and reaching with right upper extremity.   Baseline/Current: NEARLY COMPLETE.  Patient has significantly improved right shoulder strength with residual weakness (see manual muscle testing).     Goal #2: Patient will be able to reach over head with minimal symptoms in order to place/remove an object over shoulder height in a cabinet.  Baseline/Current: COMPLETE.  Patient can now reach over head without issue.    Goal #3: Patient will return to recreational activities (working out in gym) without limit.  Baseline/Current: PARTIALLY COMPLETE.  Patient has returned to limited weight lifting.    Goal #4: Patient's FOTO score will improve from 34 at initial evaluation to >/= 52 at discharge in order to demonstrate a significant improvement in self-reported outcome measures that directly result from his course of physical therapy.   Baseline/Current: IN PROGRESS.  FOTO = 34    Plan    Therapy options: will be seen for skilled physical therapy services  Planned therapy interventions: Body Mechanics Training, Dry Needling, Education - Patient, Home Exercise Program, Manual Therapy, Neuromuscular Re-education, Postural Training, Therapeutic Activities and Therapeutic Exercises      Frequency: 1x week    Duration in weeks: 6    Education provided to: patient.    Education provided: HEP, Estate manager/land agent, Treatment options and plan, Symptom management and Body awareness    Education results: verbalized good understanding, demonstrates understanding and needs further instruction.    Communication/Consultation: N/A.    Next visit plan: Posterior shoulder/depressor strengthening.    Total Session Time: 45    Treatment rendered today:      Therapeutic Exercise: 40 minutes  Patient Education: Plan of Care and Home Exercise Program.   Exercises:  - Seated Bilateral Horizontal Abduction With Band @ Large Foam Roll: 25 x (L5)  - Seated Bilateral ER With Band @ Large Foam: 25 x (L5)  - Seated Swimmers With Band @ Large Foam Roll: 25 x (L5)  - Prone Horizontal Abduction c Scapular Adduction: 2 x 12, 2 lb  - Y Wall Lift Off: 2 x [10 x 5]  - Bilateral Shoulder Extension With Band, working past body: 2 x 12 (green/light)    Manual Therapy: 5 minutes  - Pin and Stretch Right Upper Trapezius  Intramuscular Therapy: 5 minutes  [facility]  was informed of the risks associated with dry needling and he signed consent form.  Patient screened for any contraindications to needling including pregnancy, infection, antibiotic use, anticoagulants use, autoimmune issues, uncontrolled diabetes, and active cancer/treatment and does not have any of these conditions.  DRY NEEDLING FOR RELEASING IDENTIFIED TRIGGER POINTS TO FACILITATE MANUAL THERAPY TECHNIQUES AND ALLOW MUSCLE TO RETURN TO OPTIMAL LENGTH (NO CHARGE).  Right upper Trapezius trigger point with 50mm x .30mm with Pinch/Pull Technique established for safety - Local Twitch Response produced       Plan details: Access Code: [email or web address]  URL: [email or web address]  Date: 05/11/2023  Prepared by: Christianne Dolin    Exercises  - Standing Shoulder Horizontal Abduction with Resistance  - 1 x daily - 7 x weekly - 2 sets - 10 reps - 1-2 hold  - Shoulder External Rotation and Scapular Retraction with Resistance  - 1 x daily - 7 x weekly - 2 sets - 10 reps  - Standing Shoulder flexion Wall Slide (Mirrored)  - 1 x daily - 7 x weekly - 1 sets - 15 reps  - Seated Shoulder Scaption AAROM with Pulley at Side (Mirrored)  - 1 x daily - 7 x weekly - 3 sets - 10 reps  - Seated Upper Trapezius Stretch  - 1 x daily - 7 x weekly - 1 sets - 3 reps - 30 hold  - Standing Shoulder Internal Rotation Stretch with Hands Behind Back (Mirrored)  - 1 x daily - 7 x weekly - 1 sets - 15 reps - 5 hold  - Sleeper Stretch (Mirrored)  - 1 x daily - 7 x weekly - 1 sets - 10 reps - 10 hold  - Sidelying Shoulder Horizontal Abduction  - 1 x daily - 7 x weekly - 2 sets - 10 reps  - Chair Press Ups Forward and Backward  - 1 x daily - 7 x weekly - 1 sets - 10 reps - 5 hold  - Supine Pectoralis Stretch  - 1 x daily - 7 x weekly - 1 sets - 10 reps - 10 hold  - Prone Single Arm Shoulder Horizontal Abduction  with Dumbbell - Palm Down  - 1 x daily - 7 x weekly - 2 sets - 10 reps  - Low Trap Setting at Wall  - 1 x daily - 7 x weekly - 2 sets - 10 reps - 5 hold  - Shoulder extension with resistance - Neutral  - 1 x daily - 7 x weekly - 2 sets - 10 reps  - CLX Tricep Pressdowns  - 1 x daily - 7 x weekly - 2 sets - 10 reps      Subjective:     History of Present Condition     Date of surgery:  11/16/2021    Date of onset:  09/18/2016    Date is approximation?: yes    Explanation:  Patient reports onset of symptoms in early 2018.       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 Mormando is a 42 y.o. right hand dominant adult who presents to outpatient physical therapy with right neck and shoulder pain that began in early 2018 when he was changing the oil in his car when it fell onto his sternum.  He went to the ER and got an x-ray which showed a sternal fracture.  Since then, he has had persistent issues and nerve pressure in his right neck/shoulder which led him to get a right anterior scalenectomy, middle scalenectomy, first rib resection and right Pectoralis minor release surgery performed on 11/16/2021 by Thora Lance, MD.  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  Subjective:  Mr. Fayson reports that his shoulder has felt ok recently, but he hasn't been to the gym for about 1 month.  His hip has started to bother him again.  He has been doing his exercises consistently.  Pain:     Current pain rating:  2  Location:  Right neck/shoulder    Quality:  Throbbing and aching    Progression:  Improved      Objective:       Palpation     Right Shoulder   Hypertonic in the upper trapezius.   Tenderness of the upper trapezius.     Right Cervical/Thoracic   Hypertonic in the upper trapezius.   Tenderness of the upper trapezius.                                   I attest that I have reviewed the above information.  Signed: Patton Salles, PT  05/31/2023 12:23 PM

## 2023-06-01 DIAGNOSIS — F1320 Sedative, hypnotic or anxiolytic dependence, uncomplicated: Principal | ICD-10-CM

## 2023-06-01 DIAGNOSIS — F411 Generalized anxiety disorder: Principal | ICD-10-CM

## 2023-06-01 MED ORDER — CLONAZEPAM 0.5 MG TABLET
ORAL_TABLET | Freq: Two times a day (BID) | ORAL | 0 refills | 30.00 days | PRN
Start: 2023-06-01 — End: ?

## 2023-06-02 MED ORDER — CLONAZEPAM 0.5 MG TABLET
ORAL_TABLET | Freq: Two times a day (BID) | ORAL | 0 refills | 30 days | Status: CP | PRN
Start: 2023-06-02 — End: ?
  Filled 2023-06-06: qty 30, 30d supply, fill #0

## 2023-06-07 NOTE — Unmapped (Signed)
[facility] SUBJECT FOR NEW MSG: Patient Advice Request Patient Name: Vincent Waters  Caller: Self (Patient)  Contact Method: MyChart or Telephone Call: Time- Any Time [phone]   Reason for Call: Patient stated he has sent messages on MyChart about needing a letter stating he is out of work until April.   Previously Discussed: no  Appointment Offered: Declined  If offered accepted, scheduled appt date:

## 2023-06-09 DIAGNOSIS — Z79899 Other long term (current) drug therapy: Principal | ICD-10-CM

## 2023-06-09 MED ORDER — DESCOVY 200 MG-25 MG TABLET
ORAL_TABLET | Freq: Every day | ORAL | 0 refills | 90.00 days | Status: CP
Start: 2023-06-09 — End: ?
  Filled 2023-06-12: qty 30, 30d supply, fill #0

## 2023-06-09 NOTE — Unmapped (Addendum)
[facility] Specialty [facility] Home Delivery Pharmacy Refill Coordination Note    Vincent Waters, Roann: Aug 14, 1980  Phone: [phone] (home) [phone] (work)      All above HIPAA information was verified with patient.         06/07/2023     6:44 PM   Specialty Rx Medication Refill Questionnaire   Which Medications would you like refilled and shipped? Descovy   Please list all current allergies: Latex penicillin   Have you missed any doses in the last 30 days? No   Have you had any changes to your medication(s) since your last refill? No   How many days remaining of each medication do you have at home? 10   Have you experienced any side effects in the last 30 days? No   Please enter the full address (street address, city, state, zip code) where you would like your medication(s) to be delivered to. [address] [place] [address]   Please specify on which day you would like your medication(s) to arrive. Note: if you need your medication(s) within 3 days, please call the pharmacy to schedule your order at [phone]  06/09/2023   Has your insurance changed since your last refill? No   Would you like a pharmacist to call you to discuss your medication(s)? No   Do you require a signature for your package? (Note: if we are billing Medicare Part B or your order contains a controlled substance, we will require a signature) No         Completed refill call assessment today to schedule patient's medication shipment from the [facility] Delivery Pharm[facility] [phone]).  All relevant notes have been reviewed.       Confirmed patient received a Conservation officer, historic buildings and a Surveyor, mining with first shipment. The patient will receive a drug information handout for each medication shipped and additional FDA Medication Guides as required.         REFERRAL TO PHARMACIST     Referral to the pharmacist: Not needed      [facility]     Shipping address confirmed in Epic.     Delivery Scheduled: Yes, Expected medication delivery date: Spoke to pt new delivery date 06/12/23.    Medication will be delivered via Same Day Courier to the prescription address in Epic WAM.    Dan Europe   [facility] Special[facility] Home Delivery Pharmacy Specialty Technician

## 2023-06-15 NOTE — Unmapped (Signed)
Vincent Waters Vincent Waters    Vincent Medication(s) to be Shipped:   CF/Pulmonary/Asthma: Xolair 300mg   Xolair 75mg     Other medication(s) to be shipped: No additional medications requested for fill at this time     Vincent Waters, DOB: 01/31/1981  Phone: [phone] (home) [phone] (work)      All above HIPAA information was verified with patient.     Was a Nurse, learning disability used for this call? No    Completed refill call assessment today to schedule patient's medication shipment from the [facility]  [phone]).  All relevant notes have been reviewed.     Vincent medication(s) and dose(s) confirmed: Regimen is correct and unchanged.   Changes to medications: Vincent Waters reports no changes at this time.  Changes to insurance: No  New side effects reported not previously addressed with a pharmacist or physician: None reported  Questions for the pharmacist: No    Confirmed patient received a Conservation officer, historic buildings and a Surveyor, mining with first shipment. The patient will receive a drug information handout for each medication shipped and additional FDA Medication Guides as required.       DISEASE/MEDICATION-SPECIFIC INFORMATION        For patients on injectable medications: Patient currently has 0 doses left.  Next injection is scheduled for 06/19/2023.    Vincent MEDICATION ADHERENCE              Were doses missed due to medication being on hold? No      XOLAIR 300 mg/2 mL auto-injector (omalizumab): 0 doses of medicine on hand     XOLAIR 75 mg/0.5 mL Atin (omalizumab): 0 doses of medicine on hand       REFERRAL TO PHARMACIST     Referral to the pharmacist: Not needed      SHIPPING     Shipping address confirmed in Epic.       Delivery Scheduled: Yes, Expected medication delivery date: 06/19/2023.     Medication will be delivered via Same Day Courier to the temporary address in Epic WAM.    Vincent Waters [facility]  Vincent Technician

## 2023-06-16 ENCOUNTER — Ambulatory Visit
Admit: 2023-06-16 | Discharge: 2023-06-17 | Payer: BLUE CROSS/BLUE SHIELD | Attending: Student in an Organized Health Care Education/Training Program | Primary: Student in an Organized Health Care Education/Training Program

## 2023-06-16 DIAGNOSIS — N898 Other specified noninflammatory disorders of vagina: Principal | ICD-10-CM

## 2023-06-16 DIAGNOSIS — Z113 Encounter for screening for infections with a predominantly sexual mode of transmission: Principal | ICD-10-CM

## 2023-06-16 DIAGNOSIS — J4550 Severe persistent asthma, uncomplicated: Principal | ICD-10-CM

## 2023-06-17 MED ORDER — METRONIDAZOLE 500 MG TABLET
ORAL_TABLET | Freq: Two times a day (BID) | ORAL | 0 refills | 7.00 days | Status: CP
Start: 2023-06-17 — End: 2023-06-24
  Filled 2023-06-19: qty 14, 7d supply, fill #0

## 2023-06-17 NOTE — Unmapped (Signed)
[facility] at [facility]  Established Patient Clinic Note    Assessment/Plan:   Mr.Mosley is a 42 y.o.adult who presents for follow-up.    Problem List Items Addressed This Visit    None  Visit Diagnoses         Vaginal discharge    -  Primary    Relevant Medications    metroNIDAZOLE (FLAGYL) 500 MG tablet    Other Relevant Orders    Chlamydia/Gonorrhoeae NAA (Completed)    Wet Prep, Genital (Completed)    Rapid Trichomonas (Completed)          - Current meds:   Current Outpatient Medications:     albuterol 2.5 mg /3 mL (0.083 %) nebulizer solution, Inhale 3 mL (2.5 mg total) by nebulization every six (6) hours as needed for wheezing or shortness of breath., Disp: 90 mL, Rfl: 1    azelastine (ASTELIN) 137 mcg (0.1 %) nasal spray, Instill 2 sprays into each nostril two (2) times a day as needed for rhinitis., Disp: 30 mL, Rfl: 1    cetirizine (ZYRTEC) 10 MG tablet, Take 1 tablet (10 mg total) by mouth daily., Disp: 30 tablet, Rfl: 12    clindamycin (CLEOCIN T) 1 % lotion, Apply 1 Application topically as needed., Disp: 60 mL, Rfl: 0    clonazePAM (KLONOPIN) 0.5 MG tablet, Take 0.5 tablets (0.25 mg total) by mouth two (2) times a day as needed for anxiety., Disp: 30 tablet, Rfl: 0    COVID-19 antigen test ([facility] COVID-19 AG HOME TEST) Kit, Test as directed (Patient not taking: Reported on 06/19/2023), Disp: 8 kit, Rfl: PRN    diclofenac sodium (VOLTAREN) 1 % gel, Apply 2 g topically four (4) times a day., Disp: 100 g, Rfl: 0    emtricitabine-tenofovir alafen (DESCOVY) 200-25 mg tablet, Take 1 tablet by mouth daily., Disp: 90 tablet, Rfl: 0    famotidine (PEPCID) 40 MG tablet, Take 1 tablet (40 mg total) by mouth every evening., Disp: 30 tablet, Rfl: 1    gabapentin (NEURONTIN) 250 mg/5 mL oral solution, Take 12 mL (600 mg total) by mouth Three (3) times a day., Disp: 1080 mL, Rfl: 2    gabapentin (NEURONTIN) 250 mg/5 mL oral solution, Take 12 mL (600 mg total) by mouth Three (3) times a day. (Patient not taking: Reported on 06/19/2023), Disp: 1080 mL, Rfl: 1    inhalational spacing device Spcr, Use as directed with albuterol and symbicort, Disp: 1 each, Rfl: 1    ipratropium (ATROVENT) 42 mcg (0.06 %) nasal spray, 1-2 sprays in each nostril as needed for drainage, up to 4 time daily., Disp: 15 mL, Rfl: 2    metoclopramide (REGLAN) 10 MG tablet, Take 1/2 tablet (5 mg total) by mouth Three (3) times a day with a meal., Disp: 90 tablet, Rfl: 0    nebulizer and compressor (COMP-AIR NEBULIZER COMPRESSOR) Devi, 1 each by Miscellaneous route nightly as needed., Disp: 1 each, Rfl: 0    needle, disp, 25 Nishant (BD REGULAR BEVEL NEEDLES) 25 [place] x 5/8 Ndle, For subcutaneous hormone injection., Disp: 25 each, Rfl: 0    nifedipine 0.3% lidocaine 1.5% in petrolatum ointment, Apply a pea-size amount to anal area 3 times a day, Disp: 100 g, Rfl: 0    omalizumab (XOLAIR) 300 mg/2 mL auto-injector, Inject the contents of 1 pen (300 mg) under the skin every fourteen (14) days., Disp: 4 mL, Rfl: 11    omalizumab 75 mg/0.5 mL AtIn, Inject the  contents of 1 pen (75 mg) under the skin every fourteen (14) days., Disp: 1 mL, Rfl: 11    pantoprazole (PROTONIX) 40 MG tablet, Take 1 tablet (40 mg total) by mouth daily., Disp: 90 tablet, Rfl: 3    propranolol (INDERAL) 10 MG tablet, Take 1 tablet (10 mg total) by mouth two (2) times a day., Disp: 60 tablet, Rfl: 11    prucalopride 2 mg Tab, Take 1 tablet (2 mg total) by mouth daily., Disp: 90 tablet, Rfl: 0    safety needles (BD ECLIPSE) 25 Quadir x 1 Ndle, use for testosterone, Disp: 12 each, Rfl: 0    safety needles (BD SAFETYGLIDE NEEDLE) 18 Perrion x 1 1/2 Ndle, For drawing hormone injection, Disp: 25 each, Rfl: 0    syringe, disposable, (EASY TOUCH LUER LOCK SYRINGE) 1 mL Syrg, Use for weekly hormone injection., Disp: 4 each, Rfl: 0    syringe, disposable, (EASY TOUCH LUER LOCK SYRINGE) 1 mL Syrg, Use for testosterone, Disp: 12 each, Rfl: 0    syringe, disposable, 2.5 mL Syrg, Use weekly, Disp: 30 Syringe, Rfl: 2    testosterone cypionate (DEPOTESTOTERONE CYPIONATE) 200 mg/mL injection, Inject 0.2 mL (40 mg total) into the muscle once a week., Disp: 5 mL, Rfl: 0    triamcinolone (KENALOG) 0.1 % cream, Apply topically Two (2) times a day., Disp: 15 g, Rfl: 0    budesonide-formoterol (SYMBICORT) 160-4.5 mcg/actuation inhaler, Inhale 1 puff by mouth Two (2) times a day., Disp: 10.2 g, Rfl: 5    EPINEPHrine (EPIPEN) 0.3 mg/0.3 mL injection, Inject 0.3 mL (0.3 mg total) under the skin once for 1 dose., Disp: 2 each, Rfl: 0    metroNIDAZOLE (FLAGYL) 500 MG tablet, Take 1 tablet (500 mg total) by mouth two (2) times a day for 7 days., Disp: 14 tablet, Rfl: 0    Current Facility-Administered Medications:     omalizumab [name]) injection 300 mg, 300 mg, Subcutaneous, Q28 Days, [name], [name], [name], 300 mg at 05/24/22 1138  Assessment & Plan  Vaginal discharge  Started a few days ago, some dysuria and irritation with a thin grey/off-white discharge that he states feels exactly like his typical BV infections. Took a dose of leftover flagyl and noted symptoms starting to improve. No new sexual partners. No fever, chills, nausea/emesis, abdominal cramping, or any other associated symptoms. After discussion of eval/management options, pt agreed with checking wet prep, trich, and GC/CT, which were all negative. Discussed with pt via mychart that it is possible he has BV but is now testing negative due to having started treatment. He would prefer to treat for BV with a full course of flagyl given similarity to his prior BV episodes, which I think would be reasonable. Flagyl rx sent to pharmacy. Return precautions provided, pt verbalizes understanding and has no further questions or concerns at this time.   Orders:    Chlamydia/Gonorrhoeae NAA    Wet Prep, Genital    Rapid Trichomonas    metroNIDAZOLE (FLAGYL) 500 MG tablet; Take 1 tablet (500 mg total) by mouth two (2) times a day for 7 days.    HEALTH MAINTENANCE ITEMS STILL DUE:  There are no preventive care reminders to display for this patient.  Follow-up: No follow-ups on file.    Future Appointments   Date Time Provider Department Center   06/19/2023  9:00 AM Linus Salmons, MD [facility]   06/19/2023 12:30 PM Caid, Letha Cape, MD [facility]udy Feeler   06/22/2023  9:00 AM Domingo Cocking, FNP [facility]   06/28/2023  8:30 AM Galen Daft, FNP [facility]   06/28/2023  1:00 PM Linus Salmons, MD [facility]   06/30/2023  1:30 PM Seymour Bars, PT PTOT [facility]   07/06/2023 10:20 AM SPCAUD AUDIOLOGIST [facility] [place] [facility]   07/06/2023 10:45 AM Kimple, Adam Sw[place]il[place], MD [facility]   07/07/2023  1:30 PM Seymour Bars, PT [facility]   07/14/2023  1:30 PM Seymour Bars, PT [facility]   07/21/2023  1:30 PM Seymour Bars, PT PT[facility]   07/25/2023  8:30 AM Almodovar Sunday Shams, MD [facility]   08/15/2023  2:00 PM Rayala, Kandis Cocking, MD [facility]   08/30/2023 11:10 AM Mahoro, Champ Mungo, MD [facility]   11/16/2023  9:00 AM Thora Lance, MD [facility]       Subjective   Mr. Sharron is a 42 y.o. adult  coming to clinic today for the following issues:    Chief Complaint   Patient presents with    underarm itch     HPI: see above for more information.     I have reviewed the problem list, medications, and allergies and have updated/reconciled them if needed.    Mr. Hockersmith  reports that he has never smoked. He has never been exposed to tobacco smoke. He has never used smokeless tobacco.  Health Maintenance   Topic Date Due    DTaP/Tdap/Td Vaccines (2 - Td or Tdap) 08/19/2027    Lipid Screening  12/26/2027    Pneumococcal Vaccine 0-64  Completed    Hepatitis C Screen  Completed    COVID-19 Vaccine  Completed    Influenza Vaccine  Completed       Objective     VITALS: BP 110/76 (BP Site: R Arm, BP Position: Sitting, BP Cuff Size: Medium)  - Pulse 75  - Temp 36.9 ??C (98.5 ??F) (Oral)  - Resp 18  - Ht 162.6 cm (5' 4)  - Wt 71 kg (156 lb 9.6 oz) Comment: per pt - LMP 07/20/2017 (Exact Date)  - BMI 26.88 kg/m??     Physical Exam  General: well-appearing, sitting upright in no acute distress  Head: Normocephalic, atraumatic  ENT: No dental trauma noted.   Eyes: conjunctiva normal, non-erythematous, non-icteric, no discharge.  Neck: no thyroid enlargement or masses  Lungs: No increased work of breathing or audible wheezing  Skin: Warm, dry, no erythema or rash on exposed skin  Musculoskeletal: No visible gait abnormalities  Neurologic: Alert & oriented x 3, no gross sensorimotor abnormalities  Psychiatric: Pleasant, cooperative, good eye contact, appropriate thought processes    Wt Readings from Last 3 Encounters:   06/16/23 71 kg (156 lb 9.6 oz)   05/31/23 71 kg (156 lb 9.6 oz)   05/26/23 72 kg (158 lb 11.2 oz)      PHQ-9 PHQ-9 TOTAL SCORE   10/22/2020   9:00 AM 6   09/04/2020   9:00 AM 3   08/21/2020   9:00 AM 5   05/22/2020   3:00 PM 9     LABS/IMAGING  I have reviewed pertinent recent labs and imaging in Epic    Alver Fisher, MD, MPH (he/him)  [facility] at [facility]  [address]   [place], [place] [address]  Phone: [phone]  Fax: [phone]

## 2023-06-19 ENCOUNTER — Ambulatory Visit
Admit: 2023-06-19 | Discharge: 2023-06-19 | Payer: BLUE CROSS/BLUE SHIELD | Attending: Student in an Organized Health Care Education/Training Program | Primary: Student in an Organized Health Care Education/Training Program

## 2023-06-19 DIAGNOSIS — R070 Pain in throat: Principal | ICD-10-CM

## 2023-06-19 DIAGNOSIS — N898 Other specified noninflammatory disorders of vagina: Principal | ICD-10-CM

## 2023-06-19 DIAGNOSIS — F1320 Sedative, hypnotic or anxiolytic dependence, uncomplicated: Principal | ICD-10-CM

## 2023-06-19 DIAGNOSIS — R300 Dysuria: Principal | ICD-10-CM

## 2023-06-19 LAB — URINALYSIS WITH MICROSCOPY WITH CULTURE REFLEX PERFORMABLE
BILIRUBIN UA: NEGATIVE
BLOOD UA: NEGATIVE
GLUCOSE UA: NEGATIVE
KETONES UA: NEGATIVE
NITRITE UA: NEGATIVE
PH UA: 7 (ref 5.0–9.0)
PROTEIN UA: NEGATIVE
RBC UA: 1 /HPF (ref <=3)
SPECIFIC GRAVITY UA: 1.006 (ref 1.003–1.030)
SQUAMOUS EPITHELIAL: <1 /HPF (ref 0–5)
UROBILINOGEN UA: <2
WBC UA: 5 /HPF — ABNORMAL HIGH (ref <=2)

## 2023-06-19 MED FILL — XOLAIR 75 MG/0.5 ML SUBCUTANEOUS AUTO-INJECTOR: SUBCUTANEOUS | 28 days supply | Qty: 1 | Fill #4

## 2023-06-19 MED FILL — XOLAIR 300 MG/2 ML SUBCUTANEOUS AUTO-INJECTOR: SUBCUTANEOUS | 28 days supply | Qty: 4 | Fill #4

## 2023-06-19 NOTE — Unmapped (Signed)
 Hi Vincent Waters,    It was great to see*** you today. I look forward to seeing you at your next visit. In the meantime, please feel free to message me on mychart with any non-urgent questions or concerns (for example, refill requests for a long-term medication, or inquiries regarding vaccine availability). I do my best to respond to messages within 2 business days, however occasionally it may take longer depending on my other clinical duties.     For time-sensitive and/or new clinical concerns (including evaluation of new symptoms, medication adjustments, time-sensitive referral requests, or completion of disability or pre-employment paperwork), please call [phone] to request a phone, video, or in-person visit with me or one of my colleagues, so that we may address your concern as safely and expediently as possible.     Additionally, [facility] has an Urgent Care at our [facility] location! Call [phone] for same-day appointments, available Monday-Friday 7am-8pm; Saturday and Sunday 12pm-5pm. If you think you are having an emergency, please call 911 or go to your nearest emergency department.     If you ever need urgent help with your mental health, including for any thoughts of self-harm or suicide, please call the [place] mental health crisis line at [phone], or the national suicide prevention hotline at 210-108-5371.     Take care,    Claudie Revering, MD, MPH (he/him)  [facility] at [facility]  [address]   [place], [place] [address]  Phone: [phone]  Fax: [phone]

## 2023-06-19 NOTE — Unmapped (Signed)
[facility] at [facility]  Established Patient Clinic Note    Assessment/Plan:   [name] is a 42 y.o.adult who presents for follow-up.    - Current meds:   Current Outpatient Medications:     albuterol 2.5 mg /3 mL (0.083 %) nebulizer solution, Inhale 3 mL (2.5 mg total) by nebulization every six (6) hours as needed for wheezing or shortness of breath., Disp: 90 mL, Rfl: Waters    azelastine (ASTELIN) 137 mcg (0.Waters %) nasal spray, Instill 2 sprays into each nostril two (2) times a day as needed for rhinitis., Disp: 30 mL, Rfl: Waters    budesonide-formoterol (SYMBICORT) 160-4.5 mcg/actuation inhaler, Inhale Waters puff by mouth Two (2) times a day., Disp: 10.2 g, Rfl: 5    cetirizine (ZYRTEC) 10 MG tablet, Take Waters tablet (10 mg total) by mouth daily., Disp: 30 tablet, Rfl: 12    clindamycin (CLEOCIN T) Waters % lotion, Apply Waters Application topically as needed., Disp: 60 mL, Rfl: 0    clonazePAM (KLONOPIN) 0.5 MG tablet, Take 0.5 tablets (0.25 mg total) by mouth two (2) times a day as needed for anxiety., Disp: 30 tablet, Rfl: 0    diclofenac sodium (VOLTAREN) Waters % gel, Apply 2 g topically four (4) times a day., Disp: 100 g, Rfl: 0    emtricitabine-tenofovir alafen (DESCOVY) 200-25 mg tablet, Take Waters tablet by mouth daily., Disp: 90 tablet, Rfl: 0    EPINEPHrine (EPIPEN) 0.3 mg/0.3 mL injection, Inject 0.3 mL (0.3 mg total) under the skin once for Waters dose., Disp: 2 each, Rfl: 0    famotidine (PEPCID) 40 MG tablet, Take Waters tablet (40 mg total) by mouth every evening., Disp: 30 tablet, Rfl: Waters    gabapentin (NEURONTIN) 250 mg/5 mL oral solution, Take 12 mL (600 mg total) by mouth Three (3) times a day., Disp: 1080 mL, Rfl: 2    inhalational spacing device Spcr, Use as directed with albuterol and symbicort, Disp: Waters each, Rfl: Waters    ipratropium (ATROVENT) 42 mcg (0.06 %) nasal spray, Waters-2 sprays in each nostril as needed for drainage, up to 4 time daily., Disp: 15 mL, Rfl: 2    metoclopramide (REGLAN) 10 MG tablet, Take Waters/2 tablet (5 mg total) by mouth Three (3) times a day with a meal., Disp: 90 tablet, Rfl: 0    metroNIDAZOLE (FLAGYL) 500 MG tablet, Take Waters tablet (500 mg total) by mouth two (2) times a day for 7 days., Disp: 14 tablet, Rfl: 0    nebulizer and compressor (COMP-AIR NEBULIZER COMPRESSOR) Devi, Waters each by Miscellaneous route nightly as needed., Disp: Waters each, Rfl: 0    needle, disp, 25 Kaylen (BD REGULAR BEVEL NEEDLES) 25 [place] x 5/8 Ndle, For subcutaneous hormone injection., Disp: 25 each, Rfl: 0    nifedipine 0.3% lidocaine Waters.5% in petrolatum ointment, Apply a pea-size amount to anal area 3 times a day, Disp: 100 g, Rfl: 0    omalizumab (XOLAIR) 300 mg/2 mL auto-injector, Inject the contents of Waters pen (300 mg) under the skin every fourteen (14) days., Disp: 4 mL, Rfl: 11    omalizumab 75 mg/0.5 mL AtIn, Inject the contents of Waters pen (75 mg) under the skin every fourteen (14) days., Disp: Waters mL, Rfl: 11    pantoprazole (PROTONIX) 40 MG tablet, Take Waters tablet (40 mg total) by mouth daily., Disp: 90 tablet, Rfl: 3    propranolol (INDERAL) 10 MG tablet, Take Waters tablet (10 mg total) by mouth two (  2) times a day., Disp: 60 tablet, Rfl: 11    prucalopride 2 mg Tab, Take Waters tablet (2 mg total) by mouth daily., Disp: 90 tablet, Rfl: 0    safety needles (BD ECLIPSE) 25 Vincent Waters Ndle, use for testosterone, Disp: 12 each, Rfl: 0    safety needles (BD SAFETYGLIDE NEEDLE) 18 Danyel x Waters Waters/2 Ndle, For drawing hormone injection, Disp: 25 each, Rfl: 0    syringe, disposable, (EASY TOUCH LUER LOCK SYRINGE) Waters mL Syrg, Use for weekly hormone injection., Disp: 4 each, Rfl: 0    syringe, disposable, (EASY TOUCH LUER LOCK SYRINGE) Waters mL Syrg, Use for testosterone, Disp: 12 each, Rfl: 0    syringe, disposable, 2.5 mL Syrg, Use weekly, Disp: 30 Syringe, Rfl: 2    testosterone cypionate (DEPOTESTOTERONE CYPIONATE) 200 mg/mL injection, Inject 0.2 mL (40 mg total) into the muscle once a week., Disp: 5 mL, Rfl: 0    triamcinolone (KENALOG) 0.Waters % cream, Apply topically Two (2) times a day., Disp: 15 g, Rfl: 0    COVID-19 antigen test ([facility] COVID-19 AG HOME TEST) Kit, Test as directed (Patient not taking: Reported on 06/19/2023), Disp: 8 kit, Rfl: PRN    gabapentin (NEURONTIN) 250 mg/5 mL oral solution, Take 12 mL (600 mg total) by mouth Three (3) times a day. (Patient not taking: Reported on 06/19/2023), Disp: 1080 mL, Rfl: Waters    Current Facility-Administered Medications:     omalizumab [name]) injection 300 mg, 300 mg, Subcutaneous, Q28 Days, [name], [name], [name], 300 mg at 05/24/22 1138  Assessment & Plan  Vaginal discharge  Symptoms have continued since starting Flagyl on Friday, and now accompanied with suprapubic pain radiating to back. On Friday, GC/CT and patient-collected yeast, BV, and trich swabs were negative. Discussed that it's possible his BV test was falsely negative ISO pt already having taken a dose of Flagyl on Friday. Additionally, while a UTI would not explain pt's discharge, it may explain his suprapubic pain. After discussion of eval/management options, pt elected to proceed with speculum exam and clinician-collected vaginal swabs for yeast, BV, GC/CT, and mycoplasma genitalium, as well as UA w/ reflex culture. Speculum exam notable for a moderate amount of thin, off-white discharge, otherwise normal vaginal mucosa with vaginal cuff present (pt is s/p gender-affirming hysterectomy), without visible foreign bodies or abrasions. Testing returned intermediate for BV, and UA with small LE, 5 WBC, and rare bacteria but urine culture with mixed urogenital flora, suggestive of BV as cause of pt's symptoms. Recommend continuing flagyl for entire prescribed course and RTC if not improving by end of course. Return precautions provided, pt verbalizes understanding and has no further questions or concerns at this time.   - Continue flagyl tx for BV  Orders:    Urinalysis with Microscopy with Culture Reflex    Wet Prep, Genital    Mycoplasma genitalium NAA    Chlamydia/Gonorrhoeae NAA    Urine Culture    Throat pain  Pt endorsing throat pain, mild nasal congestion, and fatigue over the past 2-3 days. Denies chest pain, shortness of breath, dizziness, lightheadedness, or any other concerning symptoms. Pt is afebrile and hemodynamically stable. Exam notable for mild pharyngeal erythema without tonsillar edema or exudate. No swollen anterior cervical or submandibular LN. Lungs CTAB. Discussed with pt that symptoms are likely due to a viral URI and should resolve with supportive care at home. After discussion of eval/management options, pt would like to proceed with HIV, GC/CT throat swab, rapid strep (  negative), and RPP. Return precautions provided, pt verbalizes understanding and has no further questions or concerns at this time.   - Reviewed supportive care   - Return precautions provided    Orders:    HIV Antigen/Antibody Combo; Future    Chlamydia/Gonorrhoeae NAA    Respiratory Pathogen Panel    POCT Rapid Group A Strep    Benzodiazepine dependence, continuous (CMS-HCC)  Anxiety with episodic panic symptoms stably managed on clonazepam 0.25 mg BID PRN. Pt working on eventually weaning off of clonazepam gradually; having some difficulty weaning ISO multiple life stressors including deaths in the family, but has not needed any additional doses other than as prescribed. No refill needed today. PDMP shows appropriate fills.   - Continue current management   - Continue to encourage establishing care with local therapist  - Crisis numbers provided in AVS       HEALTH MAINTENANCE ITEMS STILL DUE:  There are no preventive care reminders to display for this patient.  Follow-up: No follow-ups on file.    Future Appointments   Date Time Provider Department Center   Waters/07/2023  9:00 AM Domingo Cocking, FNP [facility]   Waters/09/2023  Waters:30 PM IC CT RM 2 [facility] [place] - IC   Waters/01/2024  8:30 AM Galen Daft, FNP [facility]   Waters/01/2024  Waters:00 PM Linus Salmons, MD [facility]   Waters/03/2024  Waters:30 PM Seymour Bars, PT PTOT [facility]   Waters/16/2025 10:20 AM SPCAUD AUDIOLOGIST [facility] [place] [facility]   Waters/16/2025 10:45 AM Kimple, Adam [place], MD [facility]   Waters/17/2025  Waters:30 PM Seymour Bars, PT PTOT [facility]   Waters/24/2025  Waters:30 PM Seymour Bars, PT PTOT [facility]   Waters/31/2025  Waters:30 PM Seymour Bars, PT PTOT [facility]   07/25/2023  8:30 AM Almodovar Sunday Shams, MD [facility]   08/15/2023  2:00 PM Rayala, Kandis Cocking, MD [facility]   08/30/2023 11:10 AM Mahoro, Champ Mungo, MD [facility]   09/18/2023 11:30 AM Caid, Letha Cape, MD [facility]   11/16/2023  9:00 AM Thora Lance, MD [facility]       Subjective   [name] is a 42 y.o. adult  coming to clinic today for the following issues:    Chief Complaint   Patient presents with    Pain     Pain in lymphnodes and aching in lower abdomen     Vaginal Discharge     Taking flagyl but symptoms are getting worse     HPI: see above for more information.     I have reviewed the problem list, medications, and allergies and have updated/reconciled them if needed.    [name]  reports that he has never smoked. He has never been exposed to tobacco smoke. He has never used smokeless tobacco.  Health Maintenance   Topic Date Due    DTaP/Tdap/Td Vaccines (2 - Td or Tdap) 08/19/2027    Lipid Screening  12/26/2027    Pneumococcal Vaccine 0-64  Completed    Hepatitis C Screen  Completed    COVID-19 Vaccine  Completed    Influenza Vaccine  Completed       Objective     VITALS: BP 110/75 (BP Site: L Arm, BP Position: Sitting, BP Cuff Size: Medium)  - Pulse 85  - Temp 36.2 ??C (97.Waters ??F) (Oral)  - Resp 18  - Ht 162.6  cm (5' 4)  - Wt 72.4 kg (159 lb 9.6 oz)  - LMP 07/20/2017 (Exact Date)  - BMI 27.40 kg/m??     Physical Exam  General: well-appearing, sitting upright in no acute distress  Head: Normocephalic, atraumatic  ENT: No dental trauma noted. Mild pharyngeal erythema without tonsillar edema or exudate.  Eyes: conjunctiva normal, non-erythematous, non-icteric, no discharge.  Neck: no thyroid enlargement or masses  CV: RRR, no MRG  Lungs: CTAB, no increased work of breathing or audible wheezing  Skin: Warm, dry, no erythema or rash on exposed skin  GU: speculum exam with a moderate amount of thin, off-white discharge, otherwise normal vaginal mucosa with vaginal cuff present. No visible foreign bodies or abrasions.   Musculoskeletal: No visible gait abnormalities  Neurologic: Alert & oriented x 3, no gross sensorimotor abnormalities  Psychiatric: Pleasant, cooperative, good eye contact, appropriate thought processes    Wt Readings from Last 3 Encounters:   06/19/23 72.4 kg (159 lb 9.6 oz)   06/16/23 71 kg (156 lb 9.6 oz)   05/31/23 71 kg (156 lb 9.6 oz)      PHQ-9 PHQ-9 TOTAL SCORE   10/22/2020   9:00 AM 6   09/04/2020   9:00 AM 3   08/21/2020   9:00 AM 5   05/22/2020   3:00 PM 9     LABS/IMAGING  I have reviewed pertinent recent labs and imaging in Epic    Alver Fisher, MD, MPH (he/him)  [facility]  [address]   [place], [place] [address]  Phone: [phone]  Fax: [phone]

## 2023-06-19 NOTE — Unmapped (Signed)
 Hi Nikolaus,    It was great to see*** you today. I look forward to seeing you at your next visit. In the meantime, please feel free to message me on mychart with any non-urgent questions or concerns (for example, refill requests for a long-term medication, or inquiries regarding vaccine availability). I do my best to respond to messages within 2 business days, however occasionally it may take longer depending on my other clinical duties.     For time-sensitive and/or new clinical concerns (including evaluation of new symptoms, medication adjustments, time-sensitive referral requests, or completion of disability or pre-employment paperwork), please call [phone] to request a phone, video, or in-person visit with me or one of my colleagues, so that we may address your concern as safely and expediently as possible.     Additionally, [facility] has an Urgent Care at our [facility] location! Call [phone] for same-day appointments, available Monday-Friday 7am-8pm; Saturday and Sunday 12pm-5pm. If you think you are having an emergency, please call 911 or go to your nearest emergency department.     If you ever need urgent help with your mental health, including for any thoughts of self-harm or suicide, please call the [place] mental health crisis line at [phone], or the national suicide prevention hotline at 210-108-5371.     Take care,    Claudie Revering, MD, MPH (he/him)  [facility] at [facility]  [address]   [place], [place] [address]  Phone: [phone]  Fax: [phone]

## 2023-06-19 NOTE — Unmapped (Addendum)
Anxiety with episodic panic symptoms stably managed on clonazepam 0.25 mg BID PRN. Pt working on eventually weaning off of clonazepam gradually; having some difficulty weaning ISO multiple life stressors including deaths in the family, but has not needed any additional doses other than as prescribed. No refill needed today. PDMP shows appropriate fills.   - Continue current management   - Continue to encourage establishing care with local therapist  - Crisis numbers provided in AVS

## 2023-06-22 ENCOUNTER — Ambulatory Visit
Admit: 2023-06-22 | Discharge: 2023-06-23 | Payer: BLUE CROSS/BLUE SHIELD | Attending: Student in an Organized Health Care Education/Training Program | Primary: Student in an Organized Health Care Education/Training Program

## 2023-06-22 ENCOUNTER — Ambulatory Visit: Admit: 2023-06-22 | Discharge: 2023-06-23 | Payer: BLUE CROSS/BLUE SHIELD

## 2023-06-22 MED ORDER — FLUCONAZOLE 150 MG TABLET
ORAL_TABLET | Freq: Once | ORAL | 0 refills | 1.00 days | Status: CP
Start: 2023-06-22 — End: 2023-06-23
  Filled 2023-06-22: qty 1, 1d supply, fill #0

## 2023-06-22 NOTE — Unmapped (Deleted)
CLINIC NOTE   Sleep Clinic, Neurology  [facility]      NEW CONSULT/ EVALUATION  Date of Service: 06/21/2023  Patient Name: Vincent Waters     MRN: [id number]      Date of Birth: [birth year]  Primary Care Provider: Linus Salmons, MD   Referring Provider: Shane Crutch*     Assessment and Plan:   Impression:   Vincent Waters is a 43 y.o. adult with ***, seen for initial consultation at the request of Mal Amabile Dem* for ***.     Plan:  ***        Follow Up: *** months       Subjective:   Chief Complaint: OSA    History of Present Illness:  Vincent Waters is a 43 y.o. adult with a past medical history of anxiety, arthritis, asthma, autoimmune autonomic neuropathy, breast cyst, migraines, neuromuscular disorder, scleroderma, OSA, seen for initial consultation at the request of Mal Amabile Dem* for an opinion regarding potential etiologies, diagnostic work-up that should be considered, and/or recommendations for treatment options for OSA.      The patient underwent a sleep study on 12/23/2020 that found mild obstructive sleep apnea, mostly REM-related (AHI= 6.3/HR, REM supine AHI=15/HR) with O2 sat nadir 88%. A CPAP titration study was done 08/25/21 and patient was recommended to use CPAP at 11 cm H2O.     The patient was seen by ENT (Dr Gabriel Cirri) for evaluation of OSA. Per referral notes, patient previously had sleep study and DISE done. Previously tried oral appliance but did not tolerate (hurt his mouth). Also did not tolerate CPAP.       SLEEP HISTORY:  Bedtime behavior: ***  In bed: ***  Lights out: ***  Sleep latency: ***  Arousals: ***  Wake time: ***   Feels refreshed: ***   Out of bed: ***  Morning headaches: ***  Morning dry mouth: ***   Weekend/vacation sleep/wake times: ***  Naps: *** for *** mins each time.  Naps ***refreshing.   EDS/Driving: *** drowsiness   Circadian: ***  Caffeine: *** cups of coffee, *** caffeinated sodas, *** tea Alcohol: ***   Tobacco: ***   Ilicits: ***  Nasal symptoms: ***  Exercise: ***  Hypnotic or Stimulant meds:  - ***     Epworth Sleepiness Scale  How likely are you to doze off or fall asleep in the following situations, in contrast to feeling just tired? This refers to your usual way of life in recent times. Even if you have not done some of these things recently try to work out how they would have affected you. Use the following scale to choose the most appropriate number for each situation:  0 = would never doze or sleep.  1 = slight chance of dozing or sleeping  2 = moderate chance of dozing or sleeping  3 = high chance of dozing or sleeping     Situation      Chance of Dozing or Sleeping    Sitting and reading ***   Watching TV ***   Sitting inactive in a public place ***   Being a passenger in a motor vehicle for an hour or more ***   Lying down in the afternoon  ***   Sitting and talking to someone  ***   Sitting quietly after lunch (no alcohol) ***   Stopped for a few minutes in traffic   while driving  ***   Total  score   ***     Restless legs symptoms: {YES/NO:21013::Yes.}  Leg kicking: {YES/NO:21013::Yes.}  Leg cramps: {YES/NO:21013::Yes.}  Cataplexy: {YES/NO:21013::Yes.}  Sleep paralysis: {YES/NO:21013::Yes.}  Hypnagogic/Hypnopompic hallucinations: {YES/NO:21013::Yes.}  Nightmares: {YES/NO:21013::Yes.}  Dream enacting behaviors: {YES/NO:21013::Yes.}  Sleepwalking: {YES/NO:21013::Yes.}  Sleeptalking: {YES/NO:21013::Yes.}  Enuresis: {YES/NO:21013::Yes.}  Nocturia: {YES/NO:21013::Yes.}  Snoring: {YES/NO:21013::Yes.}  Observed apneas: {YES/NO:21013::Yes.}  Nightsweats: {YES/NO:21013::Yes.}  Waking up gasping for air: {YES/NO:21013::Yes.}  Waking up with heartburn: {YES/NO:21013::Yes.}  Bruxism: {YES/NO:21013::Yes.}    Past Medical History:   Diagnosis Date    Anxiety     Arthritis     Asthma     Autoimmune autonomic neuropathy     Breast cyst     Financial difficulties Lack of access to transportation     Migraines     Neuromuscular disorder (CMS-HCC)     Scleroderma (CMS-HCC)     Sleep apnea treated with continuous positive airway pressure (CPAP)     Visual impairment      Family History   Problem Relation Age of Onset    Cancer Mother         breast    Thyroid disease Mother     Hypertension Father     Breast cancer Sister     Cancer Sister         breast    Cancer Maternal Uncle 49        Stomach Cancer    Ovarian cancer Maternal Grandmother     Breast cancer Maternal Grandmother     Cataracts Maternal Grandmother        Family History of Sleep Disorders: ***          Current Outpatient Medications on File Prior to Visit   Medication Sig Dispense Refill    albuterol 2.5 mg /3 mL (0.083 %) nebulizer solution Inhale 3 mL (2.5 mg total) by nebulization every six (6) hours as needed for wheezing or shortness of breath. 90 mL 1    azelastine (ASTELIN) 137 mcg (0.1 %) nasal spray Instill 2 sprays into each nostril two (2) times a day as needed for rhinitis. 30 mL 1    budesonide-formoterol (SYMBICORT) 160-4.5 mcg/actuation inhaler Inhale 1 puff by mouth Two (2) times a day. 10.2 g 5    cetirizine (ZYRTEC) 10 MG tablet Take 1 tablet (10 mg total) by mouth daily. 30 tablet 12    clindamycin (CLEOCIN T) 1 % lotion Apply 1 Application topically as needed. 60 mL 0    clonazePAM (KLONOPIN) 0.5 MG tablet Take 0.5 tablets (0.25 mg total) by mouth two (2) times a day as needed for anxiety. 30 tablet 0    COVID-19 antigen test ([facility] COVID-19 AG HOME TEST) Kit Test as directed (Patient not taking: Reported on 06/19/2023) 8 kit PRN    diclofenac sodium (VOLTAREN) 1 % gel Apply 2 g topically four (4) times a day. 100 g 0    emtricitabine-tenofovir alafen (DESCOVY) 200-25 mg tablet Take 1 tablet by mouth daily. 90 tablet 0    EPINEPHrine (EPIPEN) 0.3 mg/0.3 mL injection Inject 0.3 mL (0.3 mg total) under the skin once for 1 dose. 2 each 0    famotidine (PEPCID) 40 MG tablet Take 1 tablet (40 mg total) by mouth every evening. 30 tablet 1    gabapentin (NEURONTIN) 250 mg/5 mL oral solution Take 12 mL (600 mg total) by mouth Three (3) times a day. 1080 mL 2    gabapentin (NEURONTIN) 250 mg/5 mL oral solution Take 12 mL (600 mg total)  by mouth Three (3) times a day. (Patient not taking: Reported on 06/19/2023) 1080 mL 1    inhalational spacing device Spcr Use as directed with albuterol and symbicort 1 each 1    ipratropium (ATROVENT) 42 mcg (0.06 %) nasal spray 1-2 sprays in each nostril as needed for drainage, up to 4 time daily. 15 mL 2    metoclopramide (REGLAN) 10 MG tablet Take 1/2 tablet (5 mg total) by mouth Three (3) times a day with a meal. 90 tablet 0    metroNIDAZOLE (FLAGYL) 500 MG tablet Take 1 tablet (500 mg total) by mouth two (2) times a day for 7 days. 14 tablet 0    nebulizer and compressor (COMP-AIR NEBULIZER COMPRESSOR) Devi 1 each by Miscellaneous route nightly as needed. 1 each 0    needle, disp, 25 [place] (BD REGULAR BEVEL NEEDLES) 25 [place] x 5/8 Ndle For subcutaneous hormone injection. 25 each 0    nifedipine 0.3% lidocaine 1.5% in petrolatum ointment Apply a pea-size amount to anal area 3 times a day 100 g 0    omalizumab (XOLAIR) 300 mg/2 mL auto-injector Inject the contents of 1 pen (300 mg) under the skin every fourteen (14) days. 4 mL 11    omalizumab 75 mg/0.5 mL AtIn Inject the contents of 1 pen (75 mg) under the skin every fourteen (14) days. 1 mL 11    pantoprazole (PROTONIX) 40 MG tablet Take 1 tablet (40 mg total) by mouth daily. 90 tablet 3    propranolol (INDERAL) 10 MG tablet Take 1 tablet (10 mg total) by mouth two (2) times a day. 60 tablet 11    prucalopride 2 mg Tab Take 1 tablet (2 mg total) by mouth daily. 90 tablet 0    safety needles (BD ECLIPSE) 25 [place] x 1 Ndle use for testosterone 12 each 0    safety needles (BD SAFETYGLIDE NEEDLE) 18 [place]ra x 1 1/2 Ndle For drawing hormone injection 25 each 0    syringe, disposable, (EASY TOUCH LUER LOCK SYRINGE) 1 mL Syrg Use for weekly hormone injection. 4 each 0    syringe, disposable, (EASY TOUCH LUER LOCK SYRINGE) 1 mL Syrg Use for testosterone 12 each 0    syringe, disposable, 2.5 mL Syrg Use weekly 30 Syringe 2    testosterone cypionate (DEPOTESTOTERONE CYPIONATE) 200 mg/mL injection Inject 0.2 mL (40 mg total) into the muscle once a week. 5 mL 0    triamcinolone (KENALOG) 0.1 % cream Apply topically Two (2) times a day. 15 g 0     Current Facility-Administered Medications on File Prior to Visit   Medication Dose Route Frequency Provider Last Rate Last Admin    omalizumab [name]) injection 300 mg  300 mg Subcutaneous Q28 Days [name], [name], [name]   300 mg at 05/24/22 1138     Allergies   Allergen Reactions    Latex Rash and Hives    Penicillins Anaphylaxis     Pt can tolerate amoxicillin   Has patient had a PCN reaction causing immediate rash, facial/tongue/throat swelling, SOB or lightheadedness with hypotension: no   Has patient had a PCN reaction causing severe rash involving mucus membranes or skin necrosis: no  Has patient had a PCN reaction that required hospitalization: unknown  Has patient had a PCN reaction occurring within the last 10 years: no  If all of the above answers are NO, then may proceed with Cephalosporin use.    Shellfish Containing Products Anaphylaxis    Other Rash  Nifedipine 0.3%, Lidocaine 1.5% in petrolatum ointment caused rash on his Bottom       Review of Systems:  12 point review of systems negative except for pertinent items noted in the HPI.     Objective:   Physical Exam: Last menstrual period 07/20/2017, not currently breastfeeding.   There were no vitals filed for this visit.  There is no height or weight on file to calculate BMI.   General: well groomed, in no acute distress, pleasant and cooperative  Oropharynx: Mallampati class ***, ***retrognathia, ***micrognathia, ***macroglossia, ***scalloped tongue    Neck: supple, no bruits, neck circumference *** inches   Pulmonary: clear breath sounds to auscultation bilaterally  CV: S1, S2, regular rate and rhythm, no murmurs  PV: peripheral pulses palpable and no edema  Neuro: Alert, oriented x 3 and attentive. Memory is intact to recent and remote events. Speech is clear. Pupils equal and reactive. Face symmetric. Tongue midline. Palate elevates midline. Full movement and strength in all extremities. Normal stroll.     Labs/Studies:  Lab Results   Component Value Date    IRON 69 06/24/2021    TIBC 345 06/24/2021    FERRITIN 38.9 06/24/2021     Lab Results   Component Value Date    TSH 0.632 12/26/2022     Lab Results   Component Value Date    VITAMINB12 297 06/24/2021       Prior Sleep Study Results:   12/23/20 [facility]):   - Weight: 156 lb   - BMI 26   - ESS   Impression: mild obstructive sleep apnea   - overall AHI= 6.3/HR (mostly REM related), O2 sat nadir 88%  - REM supine AHI= 15.1/HR    08/25/21 PAP Titration:   - Weight: 156 lb   - BMI: 27.0   - ESS: 0  Impressions  1. CPAP pressure of 11cm H2O was sufficient to resolve flow limitations. A minimum pressure of 9 cm H2O did treat OSA but residual flow limitations remained.  Patient slept propped up on pillows the entire night.      Recommendations  1. CPAP 11 cm H20.  Heated humidity for nasal dryness.  Mask F&P Vitera small, or patient preference.  2. Advise patient that sleeping propped up excessively can flex the neck and worsen OSA.   3. The patient needs to follow up for CPAP compliance 31 to 90 days after initiation of CPAP therapy to ensure compliance of at least 4 hours per night for more than 70% of nights.      Data/ Lab/ Inventory Review:   Polysomnography: see above (1)  History obtained from source other than patient: chart (2)  Laboratory tests ordered or reviewed: see above (1)     Patient verbalized understanding of these issues, agrees with the plan and all questions were answered today. Patient was given an opportunity to voice any other symptoms or concerns not listed above. Patient did not have any other symptoms or concerns.     I personally spent *** minutes face-to-face and non-face-to-face in the care of this patient, which includes all pre, intra, and post visit time on the date of service.    Delphina Cahill, FNP-C

## 2023-06-22 NOTE — Unmapped (Signed)
[facility] at [facility]  Established Patient Clinic Note    Assessment/Plan:   Vincent Waters is a 43 y.o.adult who presents for follow-up.    Problem List Items Addressed This Visit    None  Visit Diagnoses         Vaginal discharge    -  Primary          - Current meds:   Current Outpatient Medications:     albuterol 2.5 mg /3 mL (0.083 %) nebulizer solution, Inhale 3 mL (2.5 mg total) by nebulization every six (6) hours as needed for wheezing or shortness of breath., Disp: 90 mL, Rfl: 1    azelastine (ASTELIN) 137 mcg (0.1 %) nasal spray, Instill 2 sprays into each nostril two (2) times a day as needed for rhinitis., Disp: 30 mL, Rfl: 1    budesonide-formoterol (SYMBICORT) 160-4.5 mcg/actuation inhaler, Inhale 1 puff by mouth Two (2) times a day., Disp: 10.2 g, Rfl: 5    cetirizine (ZYRTEC) 10 MG tablet, Take 1 tablet (10 mg total) by mouth daily., Disp: 30 tablet, Rfl: 12    clindamycin (CLEOCIN T) 1 % lotion, Apply 1 Application topically as needed., Disp: 60 mL, Rfl: 0    clonazePAM (KLONOPIN) 0.5 MG tablet, Take 0.5 tablets (0.25 mg total) by mouth two (2) times a day as needed for anxiety., Disp: 30 tablet, Rfl: 0    COVID-19 antigen test ([facility] COVID-19 AG HOME TEST) Kit, Test as directed (Patient not taking: Reported on 06/19/2023), Disp: 8 kit, Rfl: PRN    diclofenac sodium (VOLTAREN) 1 % gel, Apply 2 g topically four (4) times a day., Disp: 100 g, Rfl: 0    emtricitabine-tenofovir alafen (DESCOVY) 200-25 mg tablet, Take 1 tablet by mouth daily., Disp: 90 tablet, Rfl: 0    EPINEPHrine (EPIPEN) 0.3 mg/0.3 mL injection, Inject 0.3 mL (0.3 mg total) under the skin once for 1 dose., Disp: 2 each, Rfl: 0    famotidine (PEPCID) 40 MG tablet, Take 1 tablet (40 mg total) by mouth every evening., Disp: 30 tablet, Rfl: 1    gabapentin (NEURONTIN) 250 mg/5 mL oral solution, Take 12 mL (600 mg total) by mouth Three (3) times a day., Disp: 1080 mL, Rfl: 2    gabapentin (NEURONTIN) 250 mg/5 mL oral solution, Take 12 mL (600 mg total) by mouth Three (3) times a day. (Patient not taking: Reported on 06/19/2023), Disp: 1080 mL, Rfl: 1    inhalational spacing device Spcr, Use as directed with albuterol and symbicort, Disp: 1 each, Rfl: 1    ipratropium (ATROVENT) 42 mcg (0.06 %) nasal spray, 1-2 sprays in each nostril as needed for drainage, up to 4 time daily., Disp: 15 mL, Rfl: 2    metoclopramide (REGLAN) 10 MG tablet, Take 1/2 tablet (5 mg total) by mouth Three (3) times a day with a meal., Disp: 90 tablet, Rfl: 0    metroNIDAZOLE (FLAGYL) 500 MG tablet, Take 1 tablet (500 mg total) by mouth two (2) times a day for 7 days., Disp: 14 tablet, Rfl: 0    nebulizer and compressor (COMP-AIR NEBULIZER COMPRESSOR) Devi, 1 each by Miscellaneous route nightly as needed., Disp: 1 each, Rfl: 0    needle, disp, 25 [place] (BD REGULAR BEVEL NEEDLES) 25 [place] x 5/8 Ndle, For subcutaneous hormone injection., Disp: 25 each, Rfl: 0    nifedipine 0.3% lidocaine 1.5% in petrolatum ointment, Apply a pea-size amount to anal area 3 times a day, Disp: 100 g,  Rfl: 0    omalizumab (XOLAIR) 300 mg/2 mL auto-injector, Inject the contents of 1 pen (300 mg) under the skin every fourteen (14) days., Disp: 4 mL, Rfl: 11    omalizumab 75 mg/0.5 mL AtIn, Inject the contents of 1 pen (75 mg) under the skin every fourteen (14) days., Disp: 1 mL, Rfl: 11    pantoprazole (PROTONIX) 40 MG tablet, Take 1 tablet (40 mg total) by mouth daily., Disp: 90 tablet, Rfl: 3    propranolol (INDERAL) 10 MG tablet, Take 1 tablet (10 mg total) by mouth two (2) times a day., Disp: 60 tablet, Rfl: 11    prucalopride 2 mg Tab, Take 1 tablet (2 mg total) by mouth daily., Disp: 90 tablet, Rfl: 0    safety needles (BD ECLIPSE) 25 [place] x 1 Ndle, use for testosterone, Disp: 12 each, Rfl: 0    safety needles (BD SAFETYGLIDE NEEDLE) 18 [place] x 1 1/2 Ndle, For drawing hormone injection, Disp: 25 each, Rfl: 0    syringe, disposable, (EASY TOUCH LUER LOCK SYRINGE) 1 mL Syrg, Use for weekly hormone injection., Disp: 4 each, Rfl: 0    syringe, disposable, (EASY TOUCH LUER LOCK SYRINGE) 1 mL Syrg, Use for testosterone, Disp: 12 each, Rfl: 0    syringe, disposable, 2.5 mL Syrg, Use weekly, Disp: 30 Syringe, Rfl: 2    testosterone cypionate (DEPOTESTOTERONE CYPIONATE) 200 mg/mL injection, Inject 0.2 mL (40 mg total) into the muscle once a week., Disp: 5 mL, Rfl: 0    triamcinolone (KENALOG) 0.1 % cream, Apply topically Two (2) times a day., Disp: 15 g, Rfl: 0    Current Facility-Administered Medications:     omalizumab [name]) injection 300 mg, 300 mg, Subcutaneous, Q28 Days, [name], [name], [name], 300 mg at 05/24/22 1138  Assessment & Plan  Vaginal discharge  Discharge and dysuria are gradually improving, no longer as profuse but is becoming thicker whitish-yellowish in color, which makes pt concerned that he may be developing a yeast infection from the flagyl. Previously tested negative for yeast at last visit but given the change in quality of his discharge recommended re-testing for yeast.  After discussion of risks and benefits, pt would strongly prefer not to swab again and requests empiric treatment with diflucan as this is very similar to his prior yeast infections. Strict return precautions provided, pt verbalizes understanding and has no further questions or concerns at this time.     Orders:    fluconazole (DIFLUCAN) 150 MG tablet; Take 1 tablet (150 mg total) by mouth once for 1 dose.      HEALTH MAINTENANCE ITEMS STILL DUE:  There are no preventive care reminders to display for this patient.  Follow-up: No follow-ups on file.    Future Appointments   Date Time Provider Department Center   06/27/2023 10:40 AM Linus Salmons, MD [facility]   06/28/2023  8:30 AM Galen Daft, FNP ANESPAIN[facility]   06/30/2023  1:30 PM Seymour Bars, PT PT[facility]   07/06/2023 10:20 AM SPCAUD AUDIOLOGIST [place] [facility]   07/06/2023 10:45 AM Kimple, Adam Swaz[place], MD [facility]   07/07/2023  1:30 PM Seymour Bars, PT [facility]   07/14/2023  1:30 PM Seymour Bars, PT [facility]   07/21/2023  1:30 PM Seymour Bars, PT [facility]   07/25/2023  8:30 AM Almodovar Sunday Shams, MD [facility]   08/15/2023  2:00 PM Rayala, Arlys John  Zaguirre, MD [facility]   08/30/2023 11:10 AM Mahoro, Champ Mungo, MD Ranae Pila [facility]   09/18/2023 11:30 AM Caid, Letha Cape, MD [facility]   11/16/2023  9:00 AM Thora Lance, MD [facility]       Subjective   Vincent Waters is a 43 y.o. adult  coming to clinic today for the following issues:    Chief Complaint   Patient presents with    Medication     Flagyl is not working and now discharge has gotten thick     HPI: see above for more information.     I have reviewed the problem list, medications, and allergies and have updated/reconciled them if needed.    Mr. Sabio  reports that he has never smoked. He has never been exposed to tobacco smoke. He has never used smokeless tobacco.  Health Maintenance   Topic Date Due    DTaP/Tdap/Td Vaccines (2 - Td or Tdap) 08/19/2027    Lipid Screening  12/26/2027    Pneumococcal Vaccine 0-64  Completed    Hepatitis C Screen  Completed    COVID-19 Vaccine  Completed    Influenza Vaccine  Completed       Objective     VITALS: BP 123/87 (BP Site: L Arm, BP Position: Sitting, BP Cuff Size: Medium) Comment: Average - Pulse 85  - Temp 36.8 ??C (98.3 ??F) (Oral)  - Resp 18  - Ht 162.6 cm (5' 4)  - Wt 70.8 kg (156 lb)  - LMP 07/20/2017 (Exact Date)  - BMI 26.78 kg/m??     Physical Exam  General: well-appearing, sitting upright in no acute distress  Head: Normocephalic, atraumatic  ENT: No dental trauma noted.   Eyes: conjunctiva normal, non-erythematous, non-icteric, no discharge.  Neck: no thyroid enlargement or masses  Lungs: No increased work of breathing or audible wheezing  Skin: Warm, dry, no erythema or rash on exposed skin  Musculoskeletal: No visible gait abnormalities  Neurologic: Alert & oriented x 3, no gross sensorimotor abnormalities  Psychiatric: Pleasant, cooperative, good eye contact, appropriate thought processes    Wt Readings from Last 3 Encounters:   06/22/23 70.8 kg (156 lb)   06/19/23 72.4 kg (159 lb 9.6 oz)   06/16/23 71 kg (156 lb 9.6 oz)      PHQ-9 PHQ-9 TOTAL SCORE   10/22/2020   9:00 AM 6   09/04/2020   9:00 AM 3   08/21/2020   9:00 AM 5   05/22/2020   3:00 PM 9     LABS/IMAGING  I have reviewed pertinent recent labs and imaging in Epic    Alver Fisher, MD, MPH (he/him)  [facility] at [facility]  [address]   [place], [place] [address]  Phone: [phone]  Fax: [phone]

## 2023-06-25 NOTE — Unmapped (Signed)
 Hi Vincent Waters,    It was great to see*** you today. I look forward to seeing you at your next visit. In the meantime, please feel free to message me on mychart with any non-urgent questions or concerns (for example, refill requests for a long-term medication, or inquiries regarding vaccine availability). I do my best to respond to messages within 2 business days, however occasionally it may take longer depending on my other clinical duties.     For time-sensitive and/or new clinical concerns (including evaluation of new symptoms, medication adjustments, time-sensitive referral requests, or completion of disability or pre-employment paperwork), please call [phone] to request a phone, video, or in-person visit with me or one of my colleagues, so that we may address your concern as safely and expediently as possible.     Additionally, [facility] has an Urgent Care at our [facility] location! Call [phone] for same-day appointments, available Monday-Friday 7am-8pm; Saturday and Sunday 12pm-5pm. If you think you are having an emergency, please call 911 or go to your nearest emergency department.     If you ever need urgent help with your mental health, including for any thoughts of self-harm or suicide, please call the [place] mental health crisis line at [phone], or the national suicide prevention hotline at 210-108-5371.     Take care,    Claudie Revering, MD, MPH (he/him)  [facility] at [facility]  [address]   [place], [place] [address]  Phone: [phone]  Fax: [phone]

## 2023-06-27 ENCOUNTER — Ambulatory Visit
Admit: 2023-06-27 | Discharge: 2023-06-28 | Payer: BLUE CROSS/BLUE SHIELD | Attending: Student in an Organized Health Care Education/Training Program | Primary: Student in an Organized Health Care Education/Training Program

## 2023-06-27 DIAGNOSIS — Z113 Encounter for screening for infections with a predominantly sexual mode of transmission: Principal | ICD-10-CM

## 2023-06-27 DIAGNOSIS — N898 Other specified noninflammatory disorders of vagina: Principal | ICD-10-CM

## 2023-06-27 MED ORDER — METRONIDAZOLE 0.75 % (37.5 MG/5 GRAM) VAGINAL GEL
Freq: Every evening | VAGINAL | 0 refills | 5.00 days | Status: CP
Start: 2023-06-27 — End: 2023-06-27
  Filled 2023-06-27: qty 70, 5d supply, fill #0

## 2023-06-27 NOTE — Unmapped (Signed)
[facility] at [facility]  Established Patient Clinic Note    Assessment/Plan:   Vincent Waters is a 43 y.o.adult who presents for follow-up.    Problem List Items Addressed This Visit    None  Visit Diagnoses         Vaginal discharge    -  Primary    Relevant Orders    Wet Prep, Genital (Completed)    Rapid Trichomonas (Completed)    Chlamydia/Gonorrhoeae NAA      Routine screening for STI (sexually transmitted infection)              - Current meds:   Current Outpatient Medications:     albuterol 2.5 mg /3 mL (0.083 %) nebulizer solution, Inhale 3 mL (2.5 mg total) by nebulization every six (6) hours as needed for wheezing or shortness of breath., Disp: 90 mL, Rfl: 1    azelastine (ASTELIN) 137 mcg (0.1 %) nasal spray, Instill 2 sprays into each nostril two (2) times a day as needed for rhinitis., Disp: 30 mL, Rfl: 1    budesonide-formoterol (SYMBICORT) 160-4.5 mcg/actuation inhaler, Inhale 1 puff by mouth Two (2) times a day., Disp: 10.2 g, Rfl: 5    cetirizine (ZYRTEC) 10 MG tablet, Take 1 tablet (10 mg total) by mouth daily., Disp: 30 tablet, Rfl: 12    clindamycin (CLEOCIN T) 1 % lotion, Apply 1 Application topically as needed., Disp: 60 mL, Rfl: 0    clonazePAM (KLONOPIN) 0.5 MG tablet, Take 0.5 tablets (0.25 mg total) by mouth two (2) times a day as needed for anxiety., Disp: 30 tablet, Rfl: 0    COVID-19 antigen test ([facility] COVID-19 AG HOME TEST) Kit, Test as directed (Patient not taking: Reported on 06/19/2023), Disp: 8 kit, Rfl: PRN    diclofenac sodium (VOLTAREN) 1 % gel, Apply 2 g topically four (4) times a day., Disp: 100 g, Rfl: 0    emtricitabine-tenofovir alafen (DESCOVY) 200-25 mg tablet, Take 1 tablet by mouth daily., Disp: 90 tablet, Rfl: 0    EPINEPHrine (EPIPEN) 0.3 mg/0.3 mL injection, Inject 0.3 mL (0.3 mg total) under the skin once for 1 dose., Disp: 2 each, Rfl: 0    famotidine (PEPCID) 40 MG tablet, Take 1 tablet (40 mg total) by mouth every evening., Disp: 30 tablet, Rfl: 1    gabapentin (NEURONTIN) 250 mg/5 mL oral solution, Take 12 mL (600 mg total) by mouth Three (3) times a day., Disp: 1080 mL, Rfl: 2    gabapentin (NEURONTIN) 250 mg/5 mL oral solution, Take 12 mL (600 mg total) by mouth Three (3) times a day. (Patient not taking: Reported on 06/19/2023), Disp: 1080 mL, Rfl: 1    inhalational spacing device Spcr, Use as directed with albuterol and symbicort, Disp: 1 each, Rfl: 1    ipratropium (ATROVENT) 42 mcg (0.06 %) nasal spray, 1-2 sprays in each nostril as needed for drainage, up to 4 time daily., Disp: 15 mL, Rfl: 2    metoclopramide (REGLAN) 10 MG tablet, Take 1/2 tablet (5 mg total) by mouth Three (3) times a day with a meal., Disp: 90 tablet, Rfl: 0    nebulizer and compressor (COMP-AIR NEBULIZER COMPRESSOR) Devi, 1 each by Miscellaneous route nightly as needed., Disp: 1 each, Rfl: 0    needle, disp, 25 Idora (BD REGULAR BEVEL NEEDLES) 25 Savanah x 5/8 Ndle, For subcutaneous hormone injection., Disp: 25 each, Rfl: 0    nifedipine 0.3% lidocaine 1.5% in petrolatum ointment, Apply a pea-size amount  to anal area 3 times a day, Disp: 100 g, Rfl: 0    omalizumab (XOLAIR) 300 mg/2 mL auto-injector, Inject the contents of 1 pen (300 mg) under the skin every fourteen (14) days., Disp: 4 mL, Rfl: 11    omalizumab 75 mg/0.5 mL AtIn, Inject the contents of 1 pen (75 mg) under the skin every fourteen (14) days., Disp: 1 mL, Rfl: 11    pantoprazole (PROTONIX) 40 MG tablet, Take 1 tablet (40 mg total) by mouth daily., Disp: 90 tablet, Rfl: 3    propranolol (INDERAL) 10 MG tablet, Take 1 tablet (10 mg total) by mouth two (2) times a day., Disp: 60 tablet, Rfl: 11    prucalopride 2 mg Tab, Take 1 tablet (2 mg total) by mouth daily., Disp: 90 tablet, Rfl: 0    safety needles (BD ECLIPSE) 25 Mintie x 1 Ndle, use for testosterone, Disp: 12 each, Rfl: 0    safety needles (BD SAFETYGLIDE NEEDLE) 18 Vanissa x 1 1/2 Ndle, For drawing hormone injection, Disp: 25 each, Rfl: 0    syringe, disposable, (EASY TOUCH LUER LOCK SYRINGE) 1 mL Syrg, Use for weekly hormone injection., Disp: 4 each, Rfl: 0    syringe, disposable, (EASY TOUCH LUER LOCK SYRINGE) 1 mL Syrg, Use for testosterone, Disp: 12 each, Rfl: 0    syringe, disposable, 2.5 mL Syrg, Use weekly, Disp: 30 Syringe, Rfl: 2    testosterone cypionate (DEPOTESTOTERONE CYPIONATE) 200 mg/mL injection, Inject 0.2 mL (40 mg total) into the muscle once a week., Disp: 5 mL, Rfl: 0    triamcinolone (KENALOG) 0.1 % cream, Apply topically Two (2) times a day., Disp: 15 g, Rfl: 0    Current Facility-Administered Medications:     omalizumab [name]) injection 300 mg, 300 mg, Subcutaneous, Q28 Days, [name], [name], [name], 300 mg at 05/24/22 1138  Assessment & Plan  Vaginal discharge  Pt continues to have yellowish vaginal discharge despite tx with 7-day course of flagyl as well as one-time diflucan. Pt states that symptoms are overall improving and this discharge still appears most consistent with his typical BV discharge (which he clarifies today can sometimes become yellowish and thicker like it is currently), however he is understandably frustrated that symptoms have not completely resolved. Denies fever, chills, or other systemic symptoms. Only positive test has been an intermediate positive BV midway through flagyl course. Pt does not have a cervix and had wnl speculum exam other than discharge as described on 12/30. After discussion of eval/management options, pt in agreement with repeating self-swabs for BV, yeast, trich, and GC/CT. Would strongly prefer to treat for BV, so will trial metrogel as he has only taken PO thus far. Return precautions provided, pt verbalizes understanding and has no further questions or concerns at this time.  Orders:    Wet Prep, Genital    Rapid Trichomonas    Chlamydia/Gonorrhoeae NAA    Routine screening for STI (sexually transmitted infection)           HEALTH MAINTENANCE ITEMS STILL DUE:  There are no preventive care reminders to display for this patient.  Follow-up: No follow-ups on file.    Future Appointments   Date Time Provider Department Center   06/30/2023  1:30 PM Seymour Bars, [place] [facility]   07/06/2023 10:20 AM SPCAUD AUDIOLOGIST Luberta Mutter [facility]   07/06/2023 10:45 AM Kimple, Adam [place], MD [facility]   07/07/2023  1:30 PM Myer, Sharyn Creamer, PT [facility]  07/12/2023  1:00 PM Galen Daft, FNP [facility]ES[facility]   07/14/2023  1:30 PM Seymour Bars, PT [facility]   07/21/2023  1:30 PM Seymour Bars, PT [facility]   07/25/2023  8:30 AM Almodovar Sunday Shams, MD [facility]   08/15/2023  2:00 PM Rayala, Kandis Cocking, MD [facility]   08/30/2023 11:10 AM Mahoro, Champ Mungo, MD Ranae Pila [facility]   09/18/2023 11:30 AM Caid, Letha Cape, MD [facility]   11/16/2023  9:00 AM Thora Lance, MD [facility]       Subjective   Vincent Waters is a 43 y.o. adult  coming to clinic today for the following issues:    Chief Complaint   Patient presents with    Follow-up     HPI: see above for more information.     I have reviewed the problem list, medications, and allergies and have updated/reconciled them if needed.    Vincent Waters  reports that he has never smoked. He has never been exposed to tobacco smoke. He has never used smokeless tobacco.  Health Maintenance   Topic Date Due    DTaP/Tdap/Td Vaccines (2 - Td or Tdap) 08/19/2027    Lipid Screening  12/26/2027    Pneumococcal Vaccine 0-64  Completed    Hepatitis C Screen  Completed    COVID-19 Vaccine  Completed    Influenza Vaccine  Completed       Objective     VITALS: BP 119/75 (BP Site: L Arm, BP Position: Sitting, BP Cuff Size: Medium)  - Pulse 76  - Temp 36.7 ??C (98.1 ??F) (Oral)  - Ht 162.6 cm (5' 4.02)  - Wt 70.9 kg (156 lb 3.2 oz)  - LMP 07/20/2017 (Exact Date)  - BMI 26.80 kg/m??     Physical Exam  General: well-appearing, sitting upright in no acute distress  Head: Normocephalic, atraumatic  ENT: No dental trauma noted.   Eyes: conjunctiva normal, non-erythematous, non-icteric, no discharge.  Neck: no thyroid enlargement or masses  Lungs: No increased work of breathing or audible wheezing  Skin: Warm, dry, no erythema or rash on exposed skin  Musculoskeletal: No visible gait abnormalities  Neurologic: Alert & oriented x 3, no gross sensorimotor abnormalities  Psychiatric: Pleasant, cooperative, good eye contact, appropriate thought processes    Wt Readings from Last 3 Encounters:   06/27/23 70.9 kg (156 lb 3.2 oz)   06/22/23 70.8 kg (156 lb)   06/19/23 72.4 kg (159 lb 9.6 oz)      PHQ-9 PHQ-9 TOTAL SCORE   10/22/2020   9:00 AM 6   09/04/2020   9:00 AM 3   08/21/2020   9:00 AM 5   05/22/2020   3:00 PM 9     LABS/IMAGING  I have reviewed pertinent recent labs and imaging in Epic    Alver Fisher, MD, MPH (he/him)  [facility] at [facility]  [address]   [place], [place] [address]  Phone: [phone]  Fax: [phone]

## 2023-06-28 NOTE — Unmapped (Signed)
 Hi Vincent Waters,    It was great to see*** you today. I look forward to seeing you at your next visit. In the meantime, please feel free to message me on mychart with any non-urgent questions or concerns (for example, refill requests for a long-term medication, or inquiries regarding vaccine availability). I do my best to respond to messages within 2 business days, however occasionally it may take longer depending on my other clinical duties.     For time-sensitive and/or new clinical concerns (including evaluation of new symptoms, medication adjustments, time-sensitive referral requests, or completion of disability or pre-employment paperwork), please call [phone] to request a phone, video, or in-person visit with me or one of my colleagues, so that we may address your concern as safely and expediently as possible.     Additionally, [facility] has an Urgent Care at our [facility] location! Call [phone] for same-day appointments, available Monday-Friday 7am-8pm; Saturday and Sunday 12pm-5pm. If you think you are having an emergency, please call 911 or go to your nearest emergency department.     If you ever need urgent help with your mental health, including for any thoughts of self-harm or suicide, please call the [place] mental health crisis line at [phone], or the national suicide prevention hotline at 210-108-5371.     Take care,    Claudie Revering, MD, MPH (he/him)  [facility] at [facility]  [address]   [place], [place] [address]  Phone: [phone]  Fax: [phone]

## 2023-06-28 NOTE — Unmapped (Unsigned)
Department of Anesthesiology  [facility]  [address]  [place], [place] [address]  [phone]    Chronic Pain Follow-Up Note  No diagnosis found.    Assessment and Plan  Mildreth [facility] is a 43 y.o. 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 Gayla Doss in May 2023.  He was first seen by our clinic in March 2023 for diagnostic injections.  His range of motion has an increased postoperatively, but still has tight, spasming pain in the right neck and shoulder.  He is currently weaning off benzodiazepines, discontinued opioid medications in late 2023 which he was chronically on for 3 years, and inquires about optimizing medications and procedures to improve his baseline pain at this time.  His primary pain generator includes myofascial pain post-TOS decompression.  He has other medical history that includes anxiety, scleroderma, migraines, autoimmune autonomic neuropathy, asthma.  Also follows with sports medicine and has undergone injections there as well.      January 2025  At last visit in July, the patient presented for post-procedure assessment. He underwent right spinal accessory nerve block with pulsed RFA on 12/29/22, which he endorses 70% on going relief. He denies new or worsening symptoms. In terms of medications, he takes gabapentin 600 mg TID with benefit without any adverse side effect.     Today, ***    Thoracic Outlet Syndrome - Myofascial pain - Chronic Pain Syndrome   ***  Today, patient presents for post-procedure assessment. He underwent right spinal accessory nerve block with pulsed RFA on 12/29/22, which he endorses 70% on going relief. He denies new or worsening symptoms. In terms of medications, he takes gabapentin 600 mg TID with benefit without any adverse side effect.     - Continue following with PT  - Repeat TPIs PRN, not at the same time as the block  - Continue gabapentin 600 mg oral solution TID, refilled x2, one refill remaining   - Continue clonazepam taper with PCP    We are delivering comprehensive, continuous, longitudinal care for this patient with chronic pain.     Considerations for the future:  - Repeat TPIs PRN  -Repeat spinal accessory nerve block/pulsed RFA  - Oromaxillofacial pain referral if trigeminal neuralgia suspected/ deemed separate from TOS pain   - Consider initiation of Butrans/buprenorphine product after weaning off of benzodiazepines and pain psych evaluation for COM should conservative measures fail, but hoping to avoid if possible  - Consider up titration of gabapentin oral solution or rotation to Lyrica   - Hip MRI without contrast    Urine toxicology: 04/28/22  Urine toxicology screen was appropriate.   Last Opioid Change: Last Rx in 04/28/22  Last EKG: N/A  Previous Compliance Issues: None  Naloxone ordered: no  Total morphine equivalents: 0  Benzodiazepine: Yes. Weaning.  [place] database was reviewed 06/27/23     No follow-ups on file.    No orders of the defined types were placed in this encounter.    Requested Prescriptions      No prescriptions requested or ordered in this encounter     HPI:  Mr. Ottum is seen in consultation at the request of Dr. Gayla Doss for evaluation and recommendations regarding His pain.     Patient was last seen in July. Since then, the patient has followed with Rheumatology, PT, Allergy, GI, Pathology, and ENT. The patient  underwent right spinal accessory nerve block with pulsed RFA on 04/10/23 with our clinic.     Today, ***    Current Medications:  Gabapentin 600 mg TID- has been on for years.  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  Voltaren gel    The patient states his pain is located *** and the severity of his pain ranges from ***/10 to ***/10.  His pain currently is ***/10 and on average is ***/10. He describes the sensation of his pain as {pain described:58928}. His pain is present {pain frequency:58931} and worst {pain worst:59685}. The patient???s pain impacts {negatively affected:59709}. His interval history includes {interval history:59710}. His pain {pain changed:59711}, and he {does does not blank:47747} have new pain to discuss today. He {is/is not:36772} on blood thinners or anti-coagulants. In regards to medications currently taken for pain management, the patient {is:54246::is} tolerating these medications well and complains of associated side effects: {pain med side effects:59712}.    Patient denies homicidal/suicidal ideation.    Previous Medication Trials:  NSAIDS- ibuprofen  Antidepressants-   Anticonvulsant- gabapentin  Muscle relaxants- flexeril, baclofen  Topicals-voltaren gel  Short-acting opiates-percocet   Long-acting opiates-   Anxiolytics-   Other-     Previous Interventions  Medications, PT, surgery, injections with sports med  TOS decompression/PMR 11/16/21  TPI-05/10/22-very helpful.  Have repeated several times since  PT-2023  Left GTB CSI-sports med 05/27/22  Spinal accessory nerve block-11/04/22  Spinal accessory nerve block and pulsed RFA-12/29/22    Allergies as of 06/28/2023 - Reviewed 06/27/2023   Allergen Reaction Noted    Latex Rash and Hives 09/16/2016    Penicillins Anaphylaxis 09/16/2016    Shellfish containing products Anaphylaxis 01/23/2017    Other Rash 12/16/2020      Current Outpatient Medications   Medication Sig Dispense Refill    albuterol 2.5 mg /3 mL (0.083 %) nebulizer solution Inhale 3 mL (2.5 mg total) by nebulization every six (6) hours as needed for wheezing or shortness of breath. 90 mL 1    azelastine (ASTELIN) 137 mcg (0.1 %) nasal spray Instill 2 sprays into each nostril two (2) times a day as needed for rhinitis. 30 mL 1    budesonide-formoterol (SYMBICORT) 160-4.5 mcg/actuation inhaler Inhale 1 puff by mouth Two (2) times a day. 10.2 g 5    cetirizine (ZYRTEC) 10 MG tablet Take 1 tablet (10 mg total) by mouth daily. 30 tablet 12    clindamycin (CLEOCIN T) 1 % lotion Apply 1 Application topically as needed. 60 mL 0    clonazePAM (KLONOPIN) 0.5 MG tablet Take 0.5 tablets (0.25 mg total) by mouth two (2) times a day as needed for anxiety. 30 tablet 0    COVID-19 antigen test ([facility] COVID-19 AG HOME TEST) Kit Test as directed (Patient not taking: Reported on 06/19/2023) 8 kit PRN    diclofenac sodium (VOLTAREN) 1 % gel Apply 2 g topically four (4) times a day. 100 g 0    emtricitabine-tenofovir alafen (DESCOVY) 200-25 mg tablet Take 1 tablet by mouth daily. 90 tablet 0    EPINEPHrine (EPIPEN) 0.3 mg/0.3 mL injection Inject 0.3 mL (0.3 mg total) under the skin once for 1 dose. 2 each 0    famotidine (PEPCID) 40 MG tablet Take 1 tablet (40 mg total) by mouth every evening. 30 tablet 1    gabapentin (NEURONTIN) 250 mg/5 mL oral solution Take 12 mL (600 mg total) by mouth Three (3) times a day. 1080 mL 2    gabapentin (NEURONTIN)  250 mg/5 mL oral solution Take 12 mL (600 mg total) by mouth Three (3) times a day. (Patient not taking: Reported on 06/19/2023) 1080 mL 1    inhalational spacing device Spcr Use as directed with albuterol and symbicort 1 each 1    ipratropium (ATROVENT) 42 mcg (0.06 %) nasal spray 1-2 sprays in each nostril as needed for drainage, up to 4 time daily. 15 mL 2    metoclopramide (REGLAN) 10 MG tablet Take 1/2 tablet (5 mg total) by mouth Three (3) times a day with a meal. 90 tablet 0    nebulizer and compressor (COMP-AIR NEBULIZER COMPRESSOR) Devi 1 each by Miscellaneous route nightly as needed. 1 each 0    needle, disp, 25 Caily (BD REGULAR BEVEL NEEDLES) 25 Brionne x 5/8 Ndle For subcutaneous hormone injection. 25 each 0    nifedipine 0.3% lidocaine 1.5% in petrolatum ointment Apply a pea-size amount to anal area 3 times a day 100 g 0    omalizumab (XOLAIR) 300 mg/2 mL auto-injector Inject the contents of 1 pen (300 mg) under the skin every fourteen (14) days. 4 mL 11    omalizumab 75 mg/0.5 mL AtIn Inject the contents of 1 pen (75 mg) under the skin every fourteen (14) days. 1 mL 11    pantoprazole (PROTONIX) 40 MG tablet Take 1 tablet (40 mg total) by mouth daily. 90 tablet 3    propranolol (INDERAL) 10 MG tablet Take 1 tablet (10 mg total) by mouth two (2) times a day. 60 tablet 11    prucalopride 2 mg Tab Take 1 tablet (2 mg total) by mouth daily. 90 tablet 0    safety needles (BD ECLIPSE) 25 Jan x 1 Ndle use for testosterone 12 each 0    safety needles (BD SAFETYGLIDE NEEDLE) 18 Deitra x 1 1/2 Ndle For drawing hormone injection 25 each 0    syringe, disposable, (EASY TOUCH LUER LOCK SYRINGE) 1 mL Syrg Use for weekly hormone injection. 4 each 0    syringe, disposable, (EASY TOUCH LUER LOCK SYRINGE) 1 mL Syrg Use for testosterone 12 each 0    syringe, disposable, 2.5 mL Syrg Use weekly 30 Syringe 2    testosterone cypionate (DEPOTESTOTERONE CYPIONATE) 200 mg/mL injection Inject 0.2 mL (40 mg total) into the muscle once a week. 5 mL 0    triamcinolone (KENALOG) 0.1 % cream Apply topically Two (2) times a day. 15 g 0     Current Facility-Administered Medications   Medication Dose Route Frequency Provider Last Rate Last Admin    omalizumab [name]) injection 300 mg  300 mg Subcutaneous Q28 Days [name], [name], [name]   300 mg at 05/24/22 1138       Imaging/Tests:   MRI RLE 09/14/22  FINDINGS:        Bones: No fractures. No marrow edema or bone lesions.      Right hip: No subluxation. No effusion or intra-articular bodies.  No acetabular retroversion. Femoral head/neck offset is within normal limits.  No high-grade chondral defect. The lateral acetabular labrum appears irregular with questionable chondrolabral cleft.      Other Joints: The visualized lumbar spine, sacroiliac joints, symphysis pubis, and contralateral hip are unremarkable.      Musculature and tendons: No intramuscular edema or fatty atrophy.  Intact gluteus medius and minimus tendons. Intact hamstring, rectus femoris and iliopsoas tendons. Ischiofemoral spaces are within normal limits.      Pelvic Cavity: Intrapelvic contents are unremarkable.  Other: Visualized sciatic nerves are unremarkable.  No bursitis.  No subcutaneous mass or fluid collection.   IMPRESSION  -- Suspected right acetabular labral tear.  Consider MR arthrography for further evaluation.     MRI LLE 09/14/22  FINDINGS:     Evaluation of the bone marrow reveals normal marrow signal throughout the visualized bones of the pelvis, proximal femurs and lower lumbar spine.      The musculature of the pelvis is normal and symmetric.  Tendon origins are normal.  No evidence of bursitis.      The sacroiliac and hip joints are normal.      The sciatic nerves are normal.  Vasculature of the pelvis is normal.      Intrapelvic contents are normal.   IMPRESSION  Normal MRI of the left hip.       Lab Results   Component Value Date    PLT 316 01/19/2023     Lab Results   Component Value Date    CREATININE 0.91 01/19/2023     Lab Results   Component Value Date    ALKPHOS 110 01/19/2023    BILITOT 0.5 01/19/2023    BILIDIR 0.20 01/19/2023    PROT 7.9 01/19/2023    ALBUMIN 4.3 01/19/2023    ALT 10 01/19/2023    AST 18 01/19/2023     ROS:  General {ajlrosgen:46920}  Cardiovascular {aggROSCV:37303}  Gastrointestinal {aggROSGI:37304}  Skin {aggROSskin:37305}  Endocrine {aggROSendo:37306}  Musculoskeletal {aggROSmusk:37307}  Neurologic {aggROSneu:37308}  Psychiatric {aggROSpsych:37309}     PHYSICAL EXAM:  LMP 07/20/2017 (Exact Date)     Wt Readings from Last 3 Encounters:   06/27/23 70.9 kg (156 lb 3.2 oz)   06/22/23 70.8 kg (156 lb)   06/19/23 72.4 kg (159 lb 9.6 oz)     GENERAL:  The patient is well developed, well-nourished, and appears to be in no apparent distress.   HEAD/NECK:    Normocephalic/atraumatic. clear sclera, pupils not pinpoint  CV:  Warm and well-perfused.  LUNGS:   Normal work of breathing, no supplemental O2  EXTREMITIES:  No clubbing, cyanosis noted.  NEUROLOGIC: The patient is alert and oriented, speech fluent, normal language.   MUSCULOSKELETAL:    Motor function  preserved.    GAIT:  The patient rises from a seated position with no difficulty and ambulates with nonantalgic gait without the assistance of a walking aid.   SKIN:   No obvious rashes, lesions, or erythema.  PSY:   Appropriate affect. No overt pain behaviors. No evidence of psychomotor retardation or agitation, no signs of intoxication.

## 2023-06-29 ENCOUNTER — Inpatient Hospital Stay: Admit: 2023-06-29 | Discharge: 2023-06-30 | Payer: BLUE CROSS/BLUE SHIELD

## 2023-06-29 MED ADMIN — iohexol (OMNIPAQUE) 350 mg iodine/mL solution 75 mL: 75 mL | INTRAVENOUS | @ 18:00:00 | Stop: 2023-06-29

## 2023-06-30 NOTE — Unmapped (Unsigned)
[facility] THERAPY SERVICES AT [facility] [place]  OUTPATIENT PHYSICAL THERAPY  06/30/2023          Patient Name: [facility]  Date of Birth:1980/09/17  Diagnosis:   Encounter Diagnoses   Name Primary?    Neck pain on right side Yes    Chronic right shoulder pain      Referring MD:  Dawna Part, *     Date of Onset of Impairment-No date available  Date PT Care Plan Established or Reviewed-No date available  Date PT Treatment Started-No date available   Visit Count: Visit count could not be calculated. Make sure you are using a visit which is associated with an episode.  Plan of Care Effective Date:          Assessment/Plan:    Assessment  Assessment details:    Limited manual therapy to keep symptoms low.  Progressing posterior/depressor shoulder within tolerance.  He struggles for proper form (requires verbal and tactile feedback for correct exercise performance) and fatigues quickly.  All questions answered.  This patient requires skilled physical therapy services to address the outlined impairments in order to return to his desired level of function.           Impairments: decreased strength, hypertonicity, pain and postural weakness                      Therapy Goals      Goals:      Short Term Goals: Updated 11/07/2022.   In 6 weeks:  Goal #1: Patient will be independent with progressively monitored home exercise program.   Baseline/Current: Patient is familiar with home exercise program and performs it consistently.     Goal #2: Patient will demonstrate cervical ROM within normal limits with minimal symptoms in order to allow for functional activities such as safe driving and looking before crossing the street.   Baseline/Current: COMPLETE.  Patient now has normal cervical ROM (see ROM testing).     Goal #3: Patient will demonstrate right shoulder range of motion within normal limits with minimal symptoms for proper glenohumeral joint mechanics in order to allow for daily functional tasks. Baseline/Current: COMPLETE.  Patient has normal right shoulder range of motion (see ROM testing).     Goal #4: Patient will demonstrate improved posture without cuing.  Baseline/Current: PARTIALLY COMPLETE.  Patient has intermittently improved posture with right shoulder still sitting forward and ups.    Long Term Goals: Updated 11/07/2022.   In 12 weeks:  Goal #1: Patient will demonstrate right shoulder strength within normal limits with minimal symptoms in order to allow for pain free lifting and reaching with right upper extremity.   Baseline/Current: NEARLY COMPLETE.  Patient has significantly improved right shoulder strength with residual weakness (see manual muscle testing).     Goal #2: Patient will be able to reach over head with minimal symptoms in order to place/remove an object over shoulder height in a cabinet.  Baseline/Current: COMPLETE.  Patient can now reach over head without issue.    Goal #3: Patient will return to recreational activities (working out in gym) without limit.  Baseline/Current: PARTIALLY COMPLETE.  Patient has returned to limited weight lifting.    Goal #4: Patient's FOTO score will improve from 34 at initial evaluation to >/= 52 at discharge in order to demonstrate a significant improvement in self-reported outcome measures that directly result from his course of physical therapy.   Baseline/Current: IN PROGRESS.  FOTO = 34  Plan    Therapy options: will be seen for skilled physical therapy services    Planned therapy interventions: Body Mechanics Training, Dry Needling, Education - Patient, Home Exercise Program, Manual Therapy, Neuromuscular Re-education, Postural Training, Therapeutic Activities and Therapeutic Exercises      Frequency: 1x week    Duration in weeks: 6    Education provided to: patient.    Education provided: HEP, Estate manager/land agent, Treatment options and plan, Symptom management and Body awareness    Education results: verbalized good understanding, demonstrates understanding and needs further instruction.    Communication/Consultation: N/A.    Next visit plan:        Posterior shoulder/depressor strengthening.    Total Session Time: 45    Treatment rendered today:      Therapeutic Exercise: 40 minutes  Patient Education: Plan of Care and Home Exercise Program.   Exercises:  - Seated Bilateral Horizontal Abduction With Band @ Large Foam Roll: 25 x (L5)  - Seated Bilateral ER With Band @ Large Foam: 25 x (L5)  - Seated Swimmers With Band @ Large Foam Roll: 25 x (L5)  - Prone Horizontal Abduction c Scapular Adduction: 2 x 12, 2 lb  - Y Wall Lift Off: 2 x [10 x 5]  - Bilateral Shoulder Extension With Band, working past body: 2 x 12 (green/light)    Manual Therapy: 5 minutes  - Pin and Stretch Right Upper Trapezius  Intramuscular Therapy: 5 minutes  [facility]  was informed of the risks associated with dry needling and he signed consent form.  Patient screened for any contraindications to needling including pregnancy, infection, antibiotic use, anticoagulants use, autoimmune issues, uncontrolled diabetes, and active cancer/treatment and does not have any of these conditions.  DRY NEEDLING FOR RELEASING IDENTIFIED TRIGGER POINTS TO FACILITATE MANUAL THERAPY TECHNIQUES AND ALLOW MUSCLE TO RETURN TO OPTIMAL LENGTH (NO CHARGE).  Right upper Trapezius trigger point with 50mm x .30mm with Pinch/Pull Technique established for safety - Local Twitch Response produced       Plan details: Access Code: [email or web address]  URL: [email or web address]  Date: 05/11/2023  Prepared by: Christianne Dolin    Exercises  - Standing Shoulder Horizontal Abduction with Resistance  - 1 x daily - 7 x weekly - 2 sets - 10 reps - 1-2 hold  - Shoulder External Rotation and Scapular Retraction with Resistance  - 1 x daily - 7 x weekly - 2 sets - 10 reps  - Standing Shoulder flexion Wall Slide (Mirrored)  - 1 x daily - 7 x weekly - 1 sets - 15 reps  - Seated Shoulder Scaption AAROM with Pulley at Side (Mirrored)  - 1 x daily - 7 x weekly - 3 sets - 10 reps  - Seated Upper Trapezius Stretch  - 1 x daily - 7 x weekly - 1 sets - 3 reps - 30 hold  - Standing Shoulder Internal Rotation Stretch with Hands Behind Back (Mirrored)  - 1 x daily - 7 x weekly - 1 sets - 15 reps - 5 hold  - Sleeper Stretch (Mirrored)  - 1 x daily - 7 x weekly - 1 sets - 10 reps - 10 hold  - Sidelying Shoulder Horizontal Abduction  - 1 x daily - 7 x weekly - 2 sets - 10 reps  - Chair Press Ups Forward and Backward  - 1 x daily - 7 x weekly - 1 sets - 10 reps - 5 hold  - Supine Pectoralis Stretch  -  1 x daily - 7 x weekly - 1 sets - 10 reps - 10 hold  - Prone Single Arm Shoulder Horizontal Abduction with Dumbbell - Palm Down  - 1 x daily - 7 x weekly - 2 sets - 10 reps  - Low Trap Setting at Wall  - 1 x daily - 7 x weekly - 2 sets - 10 reps - 5 hold  - Shoulder extension with resistance - Neutral  - 1 x daily - 7 x weekly - 2 sets - 10 reps  - CLX Tricep Pressdowns  - 1 x daily - 7 x weekly - 2 sets - 10 reps      Subjective:     History of Present Condition     Date of surgery:  11/16/2021    Date of onset:  09/18/2016    Date is approximation?: yes    Explanation:  Patient reports onset of symptoms in early 2018.       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 Fausey is a 43 y.o. right hand dominant adult who presents to outpatient physical therapy with right neck and shoulder pain that began in early 2018 when he was changing the oil in his car when it fell onto his sternum.  He went to the ER and got an x-ray which showed a sternal fracture.  Since then, he has had persistent issues and nerve pressure in his right neck/shoulder which led him to get a right anterior scalenectomy, middle scalenectomy, first rib resection and right Pectoralis minor release surgery performed on 11/16/2021 by Thora Lance, MD.  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  Subjective:  Mr. Vogelman reports that ***.  He has been doing his exercises consistently.  Pain:     Current pain rating:  2  Location:  Right neck/shoulder    Quality:  Throbbing and aching    Progression:  Improved      Objective:       Palpation     Right Shoulder   Hypertonic in the upper trapezius.   Tenderness of the upper trapezius.     Right Cervical/Thoracic   Hypertonic in the upper trapezius.   Tenderness of the upper trapezius.                                   I attest that I have reviewed the above information.  Signed: Patton Salles, PT  06/30/2023 9:26 AM

## 2023-06-30 NOTE — Unmapped (Signed)
[facility]  [address]                                  [place], [place] [address]    [phone]    [facility] did not show for their scheduled Physical Therapy follow-up session.  Patient called late to cancel.  Please contact me if you have any questions or concerns.    Thank you for this referral,     Signed: Patton Salles, PT  06/30/2023 1:15 PM

## 2023-07-05 ENCOUNTER — Encounter
Admit: 2023-07-05 | Discharge: 2023-07-06 | Payer: BLUE CROSS/BLUE SHIELD | Attending: Student in an Organized Health Care Education/Training Program | Primary: Student in an Organized Health Care Education/Training Program

## 2023-07-05 DIAGNOSIS — N760 Acute vaginitis: Principal | ICD-10-CM

## 2023-07-05 DIAGNOSIS — F1320 Sedative, hypnotic or anxiolytic dependence, uncomplicated: Principal | ICD-10-CM

## 2023-07-05 DIAGNOSIS — B9689 Other specified bacterial agents as the cause of diseases classified elsewhere: Principal | ICD-10-CM

## 2023-07-05 DIAGNOSIS — F411 Generalized anxiety disorder: Principal | ICD-10-CM

## 2023-07-05 MED ORDER — CLINDAMYCIN 2 % VAGINAL CREAM
Freq: Every evening | VAGINAL | 0 refills | 8.00 days | Status: CP
Start: 2023-07-05 — End: 2023-07-13
  Filled 2023-07-06: qty 40, 8d supply, fill #0

## 2023-07-05 MED ORDER — CLONAZEPAM 0.5 MG TABLET
ORAL_TABLET | Freq: Two times a day (BID) | ORAL | 0 refills | 30 days | Status: CP | PRN
Start: 2023-07-05 — End: ?
  Filled 2023-07-06: qty 30, 30d supply, fill #0

## 2023-07-05 NOTE — Unmapped (Signed)
#   Benzodiazepine dependence, continuous  Stably managed on klonopin 0.25 mg BID PRN. Continuing to try and self-wean but this has been difficult ISO multiple recent life stressors including his father having a stroke this week. PDMP shows appropriate refills. Will re-sign controlled med agreement at next follow-up visit.  Orders:    clonazePAM (KLONOPIN) 0.5 MG tablet; Take 0.5 tablets (0.25 mg total) by mouth two (2) times a day as needed for anxiety.

## 2023-07-05 NOTE — Unmapped (Signed)
 Hi Vincent Waters,    It was great to see*** you today. I look forward to seeing you at your next visit. In the meantime, please feel free to message me on mychart with any non-urgent questions or concerns (for example, refill requests for a long-term medication, or inquiries regarding vaccine availability). I do my best to respond to messages within 2 business days, however occasionally it may take longer depending on my other clinical duties.     For time-sensitive and/or new clinical concerns (including evaluation of new symptoms, medication adjustments, time-sensitive referral requests, or completion of disability or pre-employment paperwork), please call [phone] to request a phone, video, or in-person visit with me or one of my colleagues, so that we may address your concern as safely and expediently as possible.     Additionally, [facility] has an Urgent Care at our [facility] location! Call [phone] for same-day appointments, available Monday-Friday 7am-8pm; Saturday and Sunday 12pm-5pm. If you think you are having an emergency, please call 911 or go to your nearest emergency department.     If you ever need urgent help with your mental health, including for any thoughts of self-harm or suicide, please call the [place] mental health crisis line at [phone], or the national suicide prevention hotline at 210-108-5371.     Take care,    Claudie Revering, MD, MPH (he/him)  [facility] at [facility]  [address]   [place], [place] [address]  Phone: [phone]  Fax: [phone]

## 2023-07-05 NOTE — Unmapped (Signed)
Video Visit Note    This medical encounter was conducted virtually using [email or web address]  TeleHealth protocols.    I have identified myself to the patient and conveyed my credentials to Vincent Waters.  Patient/proxy has provided verbal consent to proceed with this video visit.  In case we get disconnected, [phone] (home) [phone] (work)  In case of Emergency, patient's current location: Home: [name]  [place] [address]  Is there someone else in the room? No.     Vincent Waters is a 43 y.o. adult that presents for a video visit today.     Assessment/Plan:       Problem List Items Addressed This Visit       Benzodiazepine dependence, continuous (CMS-HCC)    Relevant Medications    clonazePAM (KLONOPIN) 0.5 MG tablet    Generalized anxiety disorder    Relevant Medications    clonazePAM (KLONOPIN) 0.5 MG tablet     Other Visit Diagnoses         Bacterial vaginosis    -  Primary    Relevant Medications    clindamycin (CLEOCIN) 2 % vaginal cream          - Current meds:   Current Outpatient Medications:     albuterol 2.5 mg /3 mL (0.083 %) nebulizer solution, Inhale 3 mL (2.5 mg total) by nebulization every six (6) hours as needed for wheezing or shortness of breath., Disp: 90 mL, Rfl: 1    azelastine (ASTELIN) 137 mcg (0.1 %) nasal spray, Instill 2 sprays into each nostril two (2) times a day as needed for rhinitis., Disp: 30 mL, Rfl: 1    budesonide-formoterol (SYMBICORT) 160-4.5 mcg/actuation inhaler, Inhale 1 puff by mouth Two (2) times a day., Disp: 10.2 g, Rfl: 5    cetirizine (ZYRTEC) 10 MG tablet, Take 1 tablet (10 mg total) by mouth daily., Disp: 30 tablet, Rfl: 12    diclofenac sodium (VOLTAREN) 1 % gel, Apply 2 g topically four (4) times a day., Disp: 100 g, Rfl: 0    emtricitabine-tenofovir alafen (DESCOVY) 200-25 mg tablet, Take 1 tablet by mouth daily., Disp: 90 tablet, Rfl: 0    EPINEPHrine (EPIPEN) 0.3 mg/0.3 mL injection, Inject 0.3 mL (0.3 mg total) under the skin once for 1 dose., Disp: 2 each, Rfl: 0    famotidine (PEPCID) 40 MG tablet, Take 1 tablet (40 mg total) by mouth every evening., Disp: 30 tablet, Rfl: 1    gabapentin (NEURONTIN) 250 mg/5 mL oral solution, Take 12 mL (600 mg total) by mouth Three (3) times a day., Disp: 1080 mL, Rfl: 2    inhalational spacing device Spcr, Use as directed with albuterol and symbicort, Disp: 1 each, Rfl: 1    ipratropium (ATROVENT) 42 mcg (0.06 %) nasal spray, 1-2 sprays in each nostril as needed for drainage, up to 4 time daily., Disp: 15 mL, Rfl: 2    metoclopramide (REGLAN) 10 MG tablet, Take 1/2 tablet (5 mg total) by mouth Three (3) times a day with a meal., Disp: 90 tablet, Rfl: 0    nebulizer and compressor (COMP-AIR NEBULIZER COMPRESSOR) Devi, 1 each by Miscellaneous route nightly as needed., Disp: 1 each, Rfl: 0    needle, disp, 25 Vincent Waters (BD REGULAR BEVEL NEEDLES) 25 Jan x 5/8 Ndle, For subcutaneous hormone injection., Disp: 25 each, Rfl: 0    nifedipine 0.3% lidocaine 1.5% in petrolatum ointment, Apply a pea-size amount to anal area 3 times a day, Disp: 100 g, Rfl:  0    omalizumab (XOLAIR) 300 mg/2 mL auto-injector, Inject the contents of 1 pen (300 mg) under the skin every fourteen (14) days., Disp: 4 mL, Rfl: 11    omalizumab 75 mg/0.5 mL AtIn, Inject the contents of 1 pen (75 mg) under the skin every fourteen (14) days., Disp: 1 mL, Rfl: 11    pantoprazole (PROTONIX) 40 MG tablet, Take 1 tablet (40 mg total) by mouth daily., Disp: 90 tablet, Rfl: 3    propranolol (INDERAL) 10 MG tablet, Take 1 tablet (10 mg total) by mouth two (2) times a day., Disp: 60 tablet, Rfl: 11    prucalopride 2 mg Tab, Take 1 tablet (2 mg total) by mouth daily., Disp: 90 tablet, Rfl: 0    safety needles (BD ECLIPSE) 25 Vincent Waters x 1 Ndle, use for testosterone, Disp: 12 each, Rfl: 0    safety needles (BD SAFETYGLIDE NEEDLE) 18 Vincent Waters x 1 1/2 Ndle, For drawing hormone injection, Disp: 25 each, Rfl: 0    syringe, disposable, (EASY TOUCH LUER LOCK SYRINGE) 1 mL Syrg, Use for weekly hormone injection., Disp: 4 each, Rfl: 0    syringe, disposable, (EASY TOUCH LUER LOCK SYRINGE) 1 mL Syrg, Use for testosterone, Disp: 12 each, Rfl: 0    syringe, disposable, 2.5 mL Syrg, Use weekly, Disp: 30 Syringe, Rfl: 2    testosterone cypionate (DEPOTESTOTERONE CYPIONATE) 200 mg/mL injection, Inject 0.2 mL (40 mg total) into the muscle once a week., Disp: 5 mL, Rfl: 0    triamcinolone (KENALOG) 0.1 % cream, Apply topically Two (2) times a day., Disp: 15 g, Rfl: 0    clindamycin (CLEOCIN) 2 % vaginal cream, Insert 1 applicator into the vagina nightly for 7 days., Disp: 40 g, Rfl: 0    clonazePAM (KLONOPIN) 0.5 MG tablet, Take 0.5 tablets (0.25 mg total) by mouth two (2) times a day as needed for anxiety., Disp: 30 tablet, Rfl: 0    COVID-19 antigen test ([facility] COVID-19 AG HOME TEST) Kit, Test as directed (Patient not taking: Reported on 07/05/2023), Disp: 8 kit, Rfl: PRN    gabapentin (NEURONTIN) 250 mg/5 mL oral solution, Take 12 mL (600 mg total) by mouth Three (3) times a day. (Patient not taking: Reported on 07/05/2023), Disp: 1080 mL, Rfl: 1    Current Facility-Administered Medications:     omalizumab Vincent Waters) injection 300 mg, 300 mg, Subcutaneous, Q28 Days, Vincent Waters, Vincent Waters, [name], 300 mg at 05/24/22 1138  Assessment & Plan  Bacterial vaginosis  Refractory to PO treatment, unclear if persistent or recurrent infection. Initially had good response after 4 days of metronidazole gel, but yellowish discharge returned yesterday (now on day 6 of gel tx). No missed doses. Wet prep was intermediate for BV and negative for yeast on 1/7; pt declined re-testing at last visit as he is confident this is BV given its similarity to his other BV infections. Initial response to flagyl supports BV as most likely diagnosis. Given that he has now failed PO and topical metronidazole, discussed treatment with clindamycin gel which pt agrees with. If still no response after 7 days, will refer to ID for further evaluation/management. Pt verbalizes understanding, is in agreement with plan and has no further questions or concerns.   - ID referral if refractory to clindamycin  Orders:    clindamycin (CLEOCIN) 2 % vaginal cream; Insert 1 applicator into the vagina nightly for 7 days.    Generalized anxiety disorder  # Benzodiazepine dependence, continuous  Stably managed on klonopin 0.25  mg BID PRN. Continuing to try and self-wean but this has been difficult ISO multiple recent life stressors including his father having a stroke this week. PDMP shows appropriate refills. Will re-sign controlled med agreement at next follow-up visit.  Orders:    clonazePAM (KLONOPIN) 0.5 MG tablet; Take 0.5 tablets (0.25 mg total) by mouth two (2) times a day as needed for anxiety.      Subjective:     HPI  Vincent Waters is a 43 y.o. adult requesting a video visit. Please see above for more information.     ROS  As per HPI.    HISTORY  I have reviewed the patient's problem list, current medications, and allergies and have updated/reconciled them as needed.    The patient reports they are currently at home. I spent 10 minutes on the real-time audio/video visit with the patient on the date of service. I spent an additional 10 minutes on pre- and post-visit activities on the date of service. The patient was physically located in [place] or a state in which I am permitted to provide care. The patient and/or parent/guardian understood that s/he may incur co-pays and cost sharing, and agreed to the telemedicine visit. The visit was reasonable and appropriate under the circumstances given the patient's presentation at the time. The patient and/or parent/guardian has been advised of the potential risks and limitations of this mode of treatment (including, but not limited to, the absence of in-person examination) and has agreed to be treated using telemedicine. The patient's/patient's family's questions regarding telemedicine have been answered. If the visit was completed in an ambulatory setting, the patient and/or parent/guardian has also been advised to contact their provider???s office for worsening conditions, and seek emergency medical treatment and/or call 911 if the patient deems either necessary.    Objective:   Patient Reported Blood Pressure Readings    General: Well appearing , in no acute distress  Skin: Color is normal. No rashes.  Psych: Appropriate affect, normal mood  Resp: Normal work of breathing, no retractions    Coding: [id number]    Alver Fisher, MD, MPH (he/him)  [facility]  [address]   [place], [place] [address]  Phone: [phone]  Fax: [phone]

## 2023-07-06 ENCOUNTER — Ambulatory Visit
Admit: 2023-07-06 | Discharge: 2023-07-06 | Payer: BLUE CROSS/BLUE SHIELD | Attending: Student in an Organized Health Care Education/Training Program | Primary: Student in an Organized Health Care Education/Training Program

## 2023-07-06 ENCOUNTER — Ambulatory Visit
Admit: 2023-07-06 | Discharge: 2023-07-06 | Payer: BLUE CROSS/BLUE SHIELD | Attending: Audiologist | Primary: Audiologist

## 2023-07-06 DIAGNOSIS — J310 Chronic rhinitis: Principal | ICD-10-CM

## 2023-07-06 MED ORDER — BUDESONIDE 0.5 MG/2 ML SUSPENSION FOR NEBULIZATION
11 refills | 0.00 days | Status: CP
Start: 2023-07-06 — End: ?
  Filled 2023-07-20: qty 120, 30d supply, fill #0

## 2023-07-06 NOTE — Unmapped (Signed)
[facility]  Audiology at [facility]    AUDIOLOGIC EVALUATION REPORT     Patient: Vincent Waters, Vincent Waters  MRN: [id number]  DOB: May 15, 1981  DATE OF EVALUATION: 07/06/2023    [place] Teammates: Please see Audiogram with full report under MEDIA tab  HISTORY     [name] is a 43 y.o. individual seen by Audiology for a hearing evaluation as part of Dr. Madelaine Bhat Kimple's ENT clinic.Today, patient reports fluctuating hearing loss and tinnitus bilaterally. He states the right ear is better than the left ear.    RESULTS     Otoscopy revealed:  Right Ear: non-occluding cerumen  Left Ear: non-occluding cerumen    Tympanometry using a 226 Hz probe tone was consistent with:  Right Ear: Type A tympanogram, consistent with normal middle ear pressure, compliance, and volume  Left Ear: Type A tympanogram, consistent with normal middle ear pressure, compliance, and volume    Today's behavioral evaluation was completed using conventional audiometry via supra-aural headphones with fair reliability. Elevated thresholds reported for pure tones. PTA does not agree with SRT bilaterally.    Right Ear: Hearing within normal limits from [phone]  Speech Recognition Threshold (SRT): 5 dB HL  Word Recognition Testing: 100% at 45 dB HL using recorded NU-6 word list.    Left Ear: Hearing within normal limits from [phone]  Speech Recognition Threshold (SRT): 5 dB HL  Word Recognition Testing: 100% at 45 dB HL using recorded NU-6 word list.    Patient was counseled on today's results and expressed understanding.     RECOMMENDATIONS     Re-evaluate hearing per ENT request, sooner should concerns arise    Madelin Headings, Au.D.    Access your Audiogram via MyChart:  - Log into myChart: Menu > Document Center > Medical Record Requests  - Click the hyperlink 'Request Medical Records'   - Fill out fields + Select Audiograms option under 'Specific Diagnostic Images' > Submit    Charges associated with this visit:  HC TYMPANOMETRY  HC PURE TONE AUDIO AIR ONLY  HC SPEECH AUDIOMETRY THRSH & SPEECH RECOG

## 2023-07-06 NOTE — Unmapped (Signed)
[facility] Specialty and Home Delivery Pharmacy Refill Coordination Note    Vincent Waters, [place]: Oct 26, 1980  Phone: [phone] (home) [phone] (work)      All above HIPAA information was verified with patient.         07/04/2023    11:04 AM   Specialty Rx Medication Refill Questionnaire   Which Medications would you like refilled and shipped? Xolair   Please list all current allergies: Latex penicillin   Have you missed any doses in the last 30 days? No   Have you had any changes to your medication(s) since your last refill? No   How many days remaining of each medication do you have at home? 1   If receiving an injectable medication, next injection date is 07/21/2023   Have you experienced any side effects in the last 30 days? No   Please enter the full address (street address, city, state, zip code) where you would like your medication(s) to be delivered to. [address]   Please specify on which day you would like your medication(s) to arrive. Note: if you need your medication(s) within 3 days, please call the pharmacy to schedule your order at [phone]  07/12/2023   Has your insurance changed since your last refill? No   Would you like a pharmacist to call you to discuss your medication(s)? No   Do you require a signature for your package? (Note: if we are billing Medicare Part B or your order contains a controlled substance, we will require a signature) No         Completed refill call assessment today to schedule patient's medication shipment from the [facility] [phone]).  All relevant notes have been reviewed.       Confirmed patient received a Conservation officer, historic buildings and a Surveyor, mining with first shipment. The patient will receive a drug information handout for each medication shipped and additional FDA Medication Guides as required.         REFERRAL TO PHARMACIST     Referral to the pharmacist: Not needed      [facility]     Shipping address confirmed in Epic.     Delivery Scheduled: Yes, Expected medication delivery date: 07/12/2023.     Medication will be delivered via Same Day Courier to the temporary address in Epic WAM.    Dorisann Frames   [facility] Special[facility] Home Delivery Pharm[facility] Specialty Technician

## 2023-07-06 NOTE — Unmapped (Signed)
Otolaryngology Clinic Note    Vincent Waters is a 43 y.o. male is seen in follow up for evaluation of allergic rhinitis s/p septoplasty and inferior turbinate resection on 01/04/2021.    Procedure(s) Performed:   1. Septoplasty (CPT - [id number])   2. Bilateral submucous resection of inferior turbinates (CPT - 30140)  3. Bilateral Waters endoscopy (CPT - [id number])    Operative Findings:   1) Septal deviation Deviated to the LEFT  2) Bilateral Inferior turbinate hypertrophy.       History of Present Illness:     The patient is a 43 y.o. male who  has a past medical history of Anxiety, Arthritis, Asthma, Autoimmune autonomic neuropathy, Breast cyst, Financial difficulties, Lack of access to transportation, Migraines, Neuromuscular disorder (CMS-HCC), Scleroderma (CMS-HCC), Sleep apnea treated with continuous positive airway pressure (CPAP), and Visual impairment. who presents with Waters congestion, facial pressure, drainage, and ear fullness/pressure for several years. He is generally prescribed antibiotics and steroids with inconsistent relief of symptoms. His symptoms cause him to snore. He also reports choking on food regularly. He currently uses Waters saline rinses daily. He uses Astelin PRN.     He reports a previous IT reduction in 2019 with short-term relief of symptoms.     Patients medical records were personally reviewed:      Pt following with [place] [facility]. Endorses constant stuffed nose, and reports having Waters turbinates shaved down previously, which he feels made his symptoms worse. Also endorses persistent bilateral ear discomfort. Says his girlfriend tells him he snores very loudly. Thinks this may be connected to his headaches. On exam today, TMs clear bilaterally, difficult to visualize tonsils as pt has difficulty relaxing or extending tongue during exam, even with tongue depressor. Says that allergist recommended he see ENT. Will place ENT referral, may benefit from sleep study in the future. - Continue zyrtec daily + eye drops as needed  - Continue following with [facility] allergy  - ENT referral for persistent Waters congestion + snoring  - Consider sleep study    Update 12/03/2020:   The patient returns today for follow-up. He reports minimal improvement in symptoms from Waters saline rinses and Waters steroid sprays. His congestion is the most bothersome and is not relieved from Afrin or medicated rinses. His symptoms started prior to hormonal therapy. He is on Oxycodone BID.     Update 01/07/2021:  The patient returns today for his first postoperative follow-up. He is doing well following surgery and reports that he is sleeping better. He is currently rinsing 3x per day and taking antibiotics as prescribed. These cause slight nausea, but nothing overtly bothersome.     Update 01/12/2023:  Vincent Waters is a 43 y.o. male who returns for follow-up. He reports PND and Waters congestion. As well as facial pain and pressure. She does have a history of OSA but has been unable to tolerate CPAP due to severe reflux with regurgitation that is worse at night. He is scheduled to see GI. She has also had a positive ANA in the past and has been referred back to rheumatology. He has been using flonase and astelin daily. She has also notices change in hearing, dizziness and pressure. Has been told that she grinds her teeth, not currently using mouth guard.     Update 07/06/2023:  Vincent Waters is a 43 y.o. male who last seen in 01/12/2023 returns for 78-months  follow-up. She underwent a hearing study due to concern of tinnitus bilaterally which was unremarkable.  She has no Waters discomfort and denies any difficulty breathing through nose. She has no ear discomfort. She has concerns of worsening seasonal allergies.     She has been having dizzy spells. She followed with her PCP and is scheduled to see her Neurologist. She has been having ???thunder headaches??? and also has light-headedness when she stands up. She is weaning herself off from clonazepam and is taking it as needed for anxiety. She has no tried physical therapy for balance in the past though she is receiving physical therapy for shoulder pain.     A 12 point review of systems was negative except as indicated.  The patient denies fevers, chills, shortness of breath, chest pain, nausea, vomiting, diarrhea, inability to lie flat, dysphagia, odynophagia, hemoptysis, hematemesis, changes in vision, changes in voice quality, otalgia, otorrhea, vertiginous symptoms, focal deficits, or other concerning symptoms.    Past Medical History     has a past medical history of Anxiety, Arthritis, Asthma, Autoimmune autonomic neuropathy, Breast cyst, Financial difficulties, Lack of access to transportation, Migraines, Neuromuscular disorder (CMS-HCC), Scleroderma (CMS-HCC), Sleep apnea treated with continuous positive airway pressure (CPAP), and Visual impairment.    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    Current Medications    Current Outpatient Medications   Medication Sig Dispense Refill    albuterol 2.5 mg /3 mL (0.083 %) nebulizer solution Inhale 3 mL (2.5 mg total) by nebulization every six (6) hours as needed for wheezing or shortness of breath. 90 mL 1    azelastine (ASTELIN) 137 mcg (0.1 %) Waters spray Instill 2 sprays into each nostril two (2) times a day as needed for rhinitis. 30 mL 1    budesonide-formoterol (SYMBICORT) 160-4.5 mcg/actuation inhaler Inhale 1 puff by mouth Two (2) times a day. 10.2 g 5    cetirizine (ZYRTEC) 10 MG tablet Take 1 tablet (10 mg total) by mouth daily. 30 tablet 12    clindamycin (CLEOCIN) 2 % vaginal cream Insert 1 applicator into the vagina nightly for 7 days. 40 g 0    clonazePAM (KLONOPIN) 0.5 MG tablet Take 0.5 tablets (0.25 mg total) by mouth two (2) times a day as needed for anxiety. 30 tablet 0    COVID-19 antigen test ([facility] COVID-19 AG HOME TEST) Kit Test as directed (Patient not taking: Reported on 07/05/2023) 8 kit PRN    diclofenac sodium (VOLTAREN) 1 % gel Apply 2 g topically four (4) times a day. 100 g 0    emtricitabine-tenofovir alafen (DESCOVY) 200-25 mg tablet Take 1 tablet by mouth daily. 90 tablet 0    EPINEPHrine (EPIPEN) 0.3 mg/0.3 mL injection Inject 0.3 mL (0.3 mg total) under the skin once for 1 dose. 2 each 0    famotidine (PEPCID) 40 MG tablet Take 1 tablet (40 mg total) by mouth every evening. 30 tablet 1    gabapentin (NEURONTIN) 250 mg/5 mL oral solution Take 12 mL (600 mg total) by mouth Three (3) times a day. 1080 mL 2    gabapentin (NEURONTIN) 250 mg/5 mL oral solution Take  12 mL (600 mg total) by mouth Three (3) times a day. (Patient not taking: Reported on 07/05/2023) 1080 mL 1    inhalational spacing device Spcr Use as directed with albuterol and symbicort 1 each 1    ipratropium (ATROVENT) 42 mcg (0.06 %) Waters spray 1-2 sprays in each nostril as needed for drainage, up to 4 time daily. 15 mL 2    metoclopramide (REGLAN) 10 MG tablet Take 1/2 tablet (5 mg total) by mouth Three (3) times a day with a meal. 90 tablet 0    nebulizer and compressor (COMP-AIR NEBULIZER COMPRESSOR) Devi 1 each by Miscellaneous route nightly as needed. 1 each 0    needle, disp, 25 [place] (BD REGULAR BEVEL NEEDLES) 25 Arlys x 5/8 Ndle For subcutaneous hormone injection. 25 each 0    nifedipine 0.3% lidocaine 1.5% in petrolatum ointment Apply a pea-size amount to anal area 3 times a day 100 g 0    omalizumab (XOLAIR) 300 mg/2 mL auto-injector Inject the contents of 1 pen (300 mg) under the skin every fourteen (14) days. 4 mL 11    omalizumab 75 mg/0.5 mL AtIn Inject the contents of 1 pen (75 mg) under the skin every fourteen (14) days. 1 mL 11    pantoprazole (PROTONIX) 40 MG tablet Take 1 tablet (40 mg total) by mouth daily. 90 tablet 3    propranolol (INDERAL) 10 MG tablet Take 1 tablet (10 mg total) by mouth two (2) times a day. 60 tablet 11    prucalopride 2 mg Tab Take 1 tablet (2 mg total) by mouth daily. 90 tablet 0    safety needles (BD ECLIPSE) 25 [place] x 1 Ndle use for testosterone 12 each 0    safety needles (BD SAFETYGLIDE NEEDLE) 18 Amiya x 1 1/2 Ndle For drawing hormone injection 25 each 0    syringe, disposable, (EASY TOUCH LUER LOCK SYRINGE) 1 mL Syrg Use for weekly hormone injection. 4 each 0    syringe, disposable, (EASY TOUCH LUER LOCK SYRINGE) 1 mL Syrg Use for testosterone 12 each 0    syringe, disposable, 2.5 mL Syrg Use weekly 30 Syringe 2    testosterone cypionate (DEPOTESTOTERONE CYPIONATE) 200 mg/mL injection Inject 0.2 mL (40 mg total) into the muscle once a week. 5 mL 0    triamcinolone (KENALOG) 0.1 % cream Apply topically Two (2) times a day. 15 g 0     Current Facility-Administered Medications   Medication Dose Route Frequency Provider Last Rate Last Admin    omalizumab [name]) injection 300 mg  300 mg Subcutaneous Q28 Days [name], [name], [name]   300 mg at 05/24/22 1138       Allergies    Allergies   Allergen Reactions    Latex Rash and Hives    Penicillins Anaphylaxis     Pt can tolerate amoxicillin   Has patient had a PCN reaction causing immediate rash, facial/tongue/throat swelling, SOB or lightheadedness with hypotension: no   Has patient had a PCN reaction causing severe rash involving mucus membranes or skin necrosis: no  Has patient had a PCN reaction that required hospitalization: unknown  Has patient had a PCN reaction occurring within the last 10 years: no  If all of the above answers are NO, then may proceed with Cephalosporin use.    Shellfish Containing Products Anaphylaxis    Other Rash     Nifedipine 0.3%, Lidocaine 1.5% in petrolatum ointment caused rash on his Bottom  Family History    Negative for bleeding disorders or free bleeding.     family history includes Breast cancer in his maternal grandmother and sister; Cancer in his mother and sister; Cancer (age of onset: 39) in his maternal uncle; Cataracts in his maternal grandmother; Hypertension in his father; Ovarian cancer in his maternal grandmother; Thyroid disease in his mother.    Social History:     reports that he has never smoked. He has never been exposed to tobacco smoke. He has never used smokeless tobacco.   reports that he does not currently use alcohol.   reports that he does not currently use drugs.    Review of Systems    A 12 system review of systems was performed and is negative other than that noted in the history of present illness.    Vital Signs  Last menstrual period 07/20/2017, not currently breastfeeding.      Physical Exam  General: Well-developed, well-nourished. Appropriate, comfortable, and in no apparent distress.  Head/Face: On external examination there is no obvious asymmetry or scars. On palpation there is no tenderness over maxillary sinuses or masses within the salivary glands. Cranial nerves V and VII are intact through all distributions.  Eyes: PERRL, EOMI, the conjunctiva are not injected and sclera is non-icteric.  Ears: On external exam, there is no obvious lesions or asymmetry. The EACs are bilaterally without cerumen or lesions. The TMs are in the neutral position and are mobile to pneumatic otoscopy bilaterally. There are no middle ear masses or fluid noted. Hearing is grossly intact bilaterally.  Nose: On external exam there are neither lesions nor asymmetry of the Waters tip/ dorsum. On anterior rhinoscopy, visualization posteriorly is limited on anterior examination. For this reason, to adequately evaluate posteriorly for masses, polypoid disease and/or signs of infections, Waters endoscopy is indicated (see procedure below).  Oral cavity/oropharynx: The mucosa of the lips, gums, hard and soft palate, posterior pharyngeal wall, tongue, floor of mouth, and buccal region are without masses or lesions and are normally hydrated. Good dentition. Tongue protrudes midline. Tonsils are normal appearing. Supraglottis not visualized due to gag reflex.  Neck: There is no asymmetry or masses. Trachea is midline. There is no enlargement of the thyroid or palpable thyroid nodules.   Lymphatics: There is no palpable lymphadenopathy along the jugulodiagastric, submental, or posterior cervical chains.  Chest: No audible wheeze, unlabored respirations.  Cardiovascular: Regular rate.  GI: Nondistended.  Neurologic: Cranial nerve???s II-XII are grossly intact. Exam is non-focal.  Extremities: No cyanosis, clubbing or edema.    Procedures:  Diagnostic Bilateral Waters Endoscopy (CPT [phone])    NOTE: Waters endoscopy is performed for the sinuses only, and not for examination of the skull base, septum or inferior turbinates, nor is it related to any previously performed septoplasty or inferior turbinate surgery or skull base surgery.     Surgeon: Egbert Garibaldi, MD  Anesthesia: none  Procedure Detail:  As a result of inability to visualize the intranasal anatomy, and after discussion of the potential risks related to the procedure (primarily bleeding), a endoscope is used to examine the left and right sinonasal cavities, including the interior of the Waters cavity and the middle and superior meatus, the turbinates, and the spheno-ethmoid recess. All these areas were inspected. Debris suctioned today.     Findings:    Inferior aspect of inferior turbinate bilaterally has been resected, septum is midline. No polyps, no purulence, mild edema, thin secretions.    Vincent Waters  Endoscopy Score: [employer] is used to assess the degree of inflammation of the sinonasal structures, including the middle and superior turbinates, the ethmoid sinuses, maxillary sinuses, frontal sinuses, and sphenoid sinuses.  In the presence of previous surgery, some or all of these structures may be absent.    Left        Polyps:  Absent (0)   Edema:   Mild (1)   Discharge:  Clear, Thin (1)    Scarring:  Absent (0)   Crusting:  None (0)      Total Left:  2     Right         Polyps:  Absent (0)   Edema:  Mild (1)   Discharge: Clear, Thin (1)    Scarring:  Absent (0)   Crusting:  None (0)      Total Right:   2      Labs and Diagnostic Tests  CT Sinus 12/03/2020:  Septal deviation to the left with bilateral swell bodies. All sinuses appear healthy. ITH.     Normal Audiogram on 07/06/2023.    Assessment:  The patient is a 43 y.o. male who  has a past medical history of Anxiety, Arthritis, Asthma, Autoimmune autonomic neuropathy, Breast cyst, Financial difficulties, Lack of access to transportation, Migraines, Neuromuscular disorder (CMS-HCC), Scleroderma (CMS-HCC), Sleep apnea treated with continuous positive airway pressure (CPAP), and Visual impairment. who presents for the evaluation of:  Allergic Rhinitis  Septal Deviation  Waters Airway Obstruction  Headache/Facial Pain  ITH (R)    Recommendations:  1.Overall patient is doing well. She has a Normal Audiogram on 07/06/2023.    2.Patient has been having dizziness and light-headedness while standing. I've recommended her to wean off benzodiazepine. Patient referred to physical therapy for balance.     3.Worsening seasonal allergies - Encouraged to add budesonide to her rinses for 3 months starting from 08/04/2023.    4.Follow-up in 80-months.     The patient voiced complete understanding of plan as detailed above and is in full agreement.    Scribe's attestation: July 06, 2023, 11:15 AM. Egbert Garibaldi, MD obtained and performed the history, physical exam and medical decision-making elements that were entered into the chart. Signed by Maurice March, Scribe, on January 19, 2023 at 11:15 AM.    ----------------------------------------------------------------------------------------------------------------------  Provider's attestation: July 06, 2023 11:15 AM. Documentation assistance provided by the Scribe. I was present during the time the encounter was recorded. The information recorded by the Scribe was done at my direction and has been reviewed and validated by me.  ----------------------------------------------------------------------------------------------------------------------

## 2023-07-07 ENCOUNTER — Ambulatory Visit
Admit: 2023-07-07 | Payer: BLUE CROSS/BLUE SHIELD | Attending: Rehabilitative and Restorative Service Providers" | Primary: Rehabilitative and Restorative Service Providers"

## 2023-07-07 ENCOUNTER — Ambulatory Visit
Admit: 2023-07-07 | Discharge: 2023-07-29 | Payer: BLUE CROSS/BLUE SHIELD | Attending: Rehabilitative and Restorative Service Providers" | Primary: Rehabilitative and Restorative Service Providers"

## 2023-07-07 NOTE — Unmapped (Signed)
Patient noted around 70% relief with last spinal accessory nerve pulsed ablation and would like to repeat.  Last done in October.  Would like to do TPI as well.    Orders Placed This Encounter   Procedures    Nerve Inj Other Periph Nerve [phone])     Right spinal accessory nerve, pulsed radiofrequncy ablation, Dr. Oda Kilts, 30 mins, driver not necessary, no anticoagulation restriction.     Standing Status:   Future     Expected Date:   07/18/2023     Expiration Date:   07/03/2024    Ultrasonic guidance for needle placement [phone])     Standing Status:   Future     Expected Date:   07/04/2023     Expiration Date:   07/03/2024    Trigger Point Injs 3+ Muscles [phone])     Cervical paraspinal. Dr. Oda Kilts, within same 30 min appointment window for spinal accessory nerve pulse RFA.,     Standing Status:   Future     Expected Date:   07/07/2023     Expiration Date:   07/06/2024

## 2023-07-07 NOTE — Unmapped (Signed)
Addended by: Carolynne Edouard on: 07/07/2023 05:00 AM     Modules accepted: Orders

## 2023-07-07 NOTE — Unmapped (Signed)
[facility] THERAPY SERVICES AT [facility] [place]  OUTPATIENT PHYSICAL THERAPY  07/07/2023  Note Type: Treatment Note       Patient Name: Vincent Waters  Date of Birth:07/07/1980  Diagnosis:   Encounter Diagnoses   Name Primary?    Neck pain on right side Yes    Chronic right shoulder pain      Referring MD:  Dawna Part, *     Date of Onset of Impairment-09/18/2016  Date PT Care Plan Established or Reviewed-11/07/2022  Date PT Treatment Started-05/06/2022   Visit Count: 20  Plan of Care Effective Date:          Assessment/Plan:    Assessment  Assessment details:    Vincent Waters is having a flare-up today.  Focused on manual therapy to release symptoms.  Afterwards, Vincent Waters stated Vincent Waters felt better.  Encouraged him to increase his water intake, focus on his posterior shoulder/postural strength and do upper trapezius/pec stretching.  Vincent Waters agreed to do this.  All questions answered.  This patient requires skilled physical therapy services to address the outlined impairments in order to return to his desired level of function.           Impairments: decreased strength, hypertonicity, pain and postural weakness                      Therapy Goals      Goals:      Short Term Goals: Updated 11/07/2022.   In 6 weeks:  Goal #1: Patient will be independent with progressively monitored home exercise program.   Baseline/Current: Patient is familiar with home exercise program and performs it consistently.     Goal #2: Patient will demonstrate cervical ROM within normal limits with minimal symptoms in order to allow for functional activities such as safe driving and looking before crossing the street.   Baseline/Current: COMPLETE.  Patient now has normal cervical ROM (see ROM testing).     Goal #3: Patient will demonstrate right shoulder range of motion within normal limits with minimal symptoms for proper glenohumeral joint mechanics in order to allow for daily functional tasks.   Baseline/Current: COMPLETE.  Patient has normal right shoulder range of motion (see ROM testing).     Goal #4: Patient will demonstrate improved posture without cuing.  Baseline/Current: PARTIALLY COMPLETE.  Patient has intermittently improved posture with right shoulder still sitting forward and ups.    Long Term Goals: Updated 11/07/2022.   In 12 weeks:  Goal #1: Patient will demonstrate right shoulder strength within normal limits with minimal symptoms in order to allow for pain free lifting and reaching with right upper extremity.   Baseline/Current: NEARLY COMPLETE.  Patient has significantly improved right shoulder strength with residual weakness (see manual muscle testing).     Goal #2: Patient will be able to reach over head with minimal symptoms in order to place/remove an object over shoulder height in a cabinet.  Baseline/Current: COMPLETE.  Patient can now reach over head without issue.    Goal #3: Patient will return to recreational activities (working out in gym) without limit.  Baseline/Current: PARTIALLY COMPLETE.  Patient has returned to limited weight lifting.    Goal #4: Patient's FOTO score will improve from 34 at initial evaluation to >/= 52 at discharge in order to demonstrate a significant improvement in self-reported outcome measures that directly result from his course of physical therapy.   Baseline/Current: IN PROGRESS.  FOTO = 34    Plan  Therapy options: will be seen for skilled physical therapy services    Planned therapy interventions: Body Mechanics Training, Dry Needling, Education - Patient, Home Exercise Program, Manual Therapy, Neuromuscular Re-education, Postural Training, Therapeutic Activities and Therapeutic Exercises      Frequency: 1x week    Duration in weeks: 6    Education provided to: patient.    Education provided: HEP, Estate manager/land agent, Treatment options and plan, Symptom management and Body awareness    Education results: verbalized good understanding, demonstrates understanding and needs further instruction. Communication/Consultation: N/A.    Next visit plan:        Posterior shoulder/depressor strengthening. Manual Therapy as needed.    Total Session Time: 45    Treatment rendered today:      Manual Therapy: 35 minutes  - Soft Tissue Mobilization Right Pectoralis Minor  - Pin and Stretch Right Pec  - GHJ AP in Various Amounts of Abduction  - GHJ in PROM IR  - Side Lying Pec/Quick Stretches  Intramuscular Therapy: 5 minutes  Vincent Waters  was informed of the risks associated with dry needling and Vincent Waters signed consent form.  Patient screened for any contraindications to needling including pregnancy, infection, antibiotic use, anticoagulants use, autoimmune issues, uncontrolled diabetes, and active cancer/treatment and does not have any of these conditions.  DRY NEEDLING FOR RELEASING IDENTIFIED TRIGGER POINTS TO FACILITATE MANUAL THERAPY TECHNIQUES AND ALLOW MUSCLE TO RETURN TO OPTIMAL LENGTH (NO CHARGE).  Right upper Trapezius trigger point with 50mm x .30mm with Pinch/Pull Technique established for safety - Local Twitch Response produced  Right SCM trigger point with 30mm x .30mm with Pinch/Pull Technique established for safety - Local Twitch Response produced     Right Scalenes trigger point with 30mm x .30mm with Flat Palpation/Direct Approach established for safety - Local Twitch Response produced   Right Pec Minor trigger point with 50mm x .30mm with Pinch/Pull Technique established for safety - Local Twitch Response produced    Plan details: Access Code: [email or web address]  URL: [email or web address]  Date: 05/11/2023  Prepared by: Christianne Dolin    Exercises  - Standing Shoulder Horizontal Abduction with Resistance  - 1 x daily - 7 x weekly - 2 sets - 10 reps - 1-2 hold  - Shoulder External Rotation and Scapular Retraction with Resistance  - 1 x daily - 7 x weekly - 2 sets - 10 reps  - Standing Shoulder flexion Wall Slide (Mirrored)  - 1 x daily - 7 x weekly - 1 sets - 15 reps  - Seated Shoulder Scaption AAROM with Pulley at Side (Mirrored)  - 1 x daily - 7 x weekly - 3 sets - 10 reps  - Seated Upper Trapezius Stretch  - 1 x daily - 7 x weekly - 1 sets - 3 reps - 30 hold  - Standing Shoulder Internal Rotation Stretch with Hands Behind Back (Mirrored)  - 1 x daily - 7 x weekly - 1 sets - 15 reps - 5 hold  - Sleeper Stretch (Mirrored)  - 1 x daily - 7 x weekly - 1 sets - 10 reps - 10 hold  - Sidelying Shoulder Horizontal Abduction  - 1 x daily - 7 x weekly - 2 sets - 10 reps  - Chair Press Ups Forward and Backward  - 1 x daily - 7 x weekly - 1 sets - 10 reps - 5 hold  - Supine Pectoralis Stretch  - 1 x daily - 7 x weekly - 1 sets -  10 reps - 10 hold  - Prone Single Arm Shoulder Horizontal Abduction with Dumbbell - Palm Down  - 1 x daily - 7 x weekly - 2 sets - 10 reps  - Low Trap Setting at Wall  - 1 x daily - 7 x weekly - 2 sets - 10 reps - 5 hold  - Shoulder extension with resistance - Neutral  - 1 x daily - 7 x weekly - 2 sets - 10 reps  - CLX Tricep Pressdowns  - 1 x daily - 7 x weekly - 2 sets - 10 reps      Subjective:     History of Present Condition     Date of surgery:  11/16/2021    Date of onset:  09/18/2016    Date is approximation?: yes    Explanation:  Patient reports onset of symptoms in early 2018.       Surgical Procedure:  Other (Right anterior scalenectomy, middle scalenectomy, first rib resection and right Pectoralis minor release)    History of Present Condition/Chief Complaint:  Vincent Waters is a 43 y.o. right hand dominant adult who presents to outpatient physical therapy with right neck and shoulder pain that began in early 2018 when Vincent Waters was changing the oil in his car when it fell onto his sternum.  Vincent Waters went to the ER and got an x-ray which showed a sternal fracture.  Since then, Vincent Waters has had persistent issues and nerve pressure in his right neck/shoulder which led him to get a right anterior scalenectomy, middle scalenectomy, first rib resection and right Pectoralis minor release surgery performed on 11/16/2021 by Thora Lance, MD.  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  Subjective:  Vincent Waters reports that Vincent Waters has been tight in his neck and it's been giving him a headache.  His balance has been off and Vincent Waters fell the other day.  Vincent Waters has been doing his exercises consistently.  Pain:     Current pain rating:  7  Location:  Right neck/shoulder    Quality:  Throbbing and aching    Progression:  Improved      Objective:       Palpation     Right Shoulder   Hypertonic in the pectoralis minor and upper trapezius.   Tenderness of the pectoralis minor and upper trapezius.     Right Cervical/Thoracic   Hypertonic in the pectoralis minor, scalenes, sternocleidomastoid and upper trapezius.   Tenderness of the pectoralis minor and upper trapezius.     Joint Mobility  Shoulder  Right Shoulder Joint Mobility    Posterior capsule: hypomobile                                  I attest that I have reviewed the above information.  Signed: Patton Salles, PT  07/07/2023 1:32 PM

## 2023-07-08 NOTE — Unmapped (Signed)
[facility] 's Xolair shipment will be rescheduled as a result of potential inclement weather next week.      I have reached out to the patient  via MyChart message and communicated the delivery change. We will reschedule the medication for the delivery date that the patient agreed upon.  We have confirmed the delivery date as 07/14/23, via same day courier.

## 2023-07-12 NOTE — Unmapped (Unsigned)
Department of Anesthesiology  [facility]  [address], [address]  [place], [place] [address]  [phone]    Chronic Pain Follow-Up Note  No diagnosis found.    Assessment and Plan  Jawana [facility] is a 43 y.o. 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 Gayla Doss in May 2023.  He was first seen by our clinic in March 2023 for diagnostic injections.  His range of motion has an increased postoperatively, but still has tight, spasming pain in the right neck and shoulder.  He is currently weaning off benzodiazepines, discontinued opioid medications in late 2023 which he was chronically on for 3 years, and inquires about optimizing medications and procedures to improve his baseline pain at this time.  His primary pain generator includes myofascial pain post-TOS decompression.  He has other medical history that includes anxiety, scleroderma, migraines, autoimmune autonomic neuropathy, asthma.  Also follows with sports medicine and has undergone injections there as well.      January 2025  At last visit in July, the patient presented for post-procedure assessment. He underwent right spinal accessory nerve block with pulsed RFA on 12/29/22, which he endorses 70% on going relief. He denies new or worsening symptoms. Currently takes gabapentin 600 mg TID with benefit without any adverse side effect.     Today, ***    Thoracic Outlet Syndrome - Myofascial pain - Chronic Pain Syndrome   ***  Today, patient presents for post-procedure assessment. He underwent right spinal accessory nerve block with pulsed RFA on 12/29/22, which he endorses 70% on going relief. He denies new or worsening symptoms. In terms of medications, he takes gabapentin 600 mg TID with benefit without any adverse side effect.     - Continue following with PT  - Repeat TPIs PRN, not at the same time as the block  - Continue gabapentin 600 mg oral solution TID, refilled x2, one refill remaining   - Continue clonazepam taper with PCP    We are delivering comprehensive, continuous, longitudinal care for this patient with chronic pain.     Considerations for the future:  - Repeat TPIs PRN  -Repeat spinal accessory nerve block/pulsed RFA  - Oromaxillofacial pain referral if trigeminal neuralgia suspected/ deemed separate from TOS pain   - Consider initiation of Butrans/buprenorphine product after weaning off of benzodiazepines and pain psych evaluation for COM should conservative measures fail, but hoping to avoid if possible  - Consider up titration of gabapentin oral solution or rotation to Lyrica   - Hip MRI without contrast    Urine toxicology: 04/28/22  Urine toxicology screen was appropriate.   Last Opioid Change: Last Rx in 04/28/22  Last EKG: N/A  Previous Compliance Issues: None  Naloxone ordered: no  Total morphine equivalents: 0  Benzodiazepine: Yes. Weaning.  [place] database was reviewed 07/11/23     No follow-ups on file.    No orders of the defined types were placed in this encounter.    Requested Prescriptions      No prescriptions requested or ordered in this encounter     HPI:  [name] is seen in consultation at the request of Dr. Gayla Doss for evaluation and recommendations regarding His pain.     Patient was last seen in July. Since then, the patient has followed with Rheumatology, PT, Allergy, GI, Pathology, and ENT. The patient underwent right spinal accessory  nerve block with pulsed RFA on 04/10/23 with our clinic.     Today, ***    Current Medications:  Gabapentin 600 mg TID- has been on for years.  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  Voltaren gel    The patient states his pain is located *** and the severity of his pain ranges from ***/10 to ***/10.  His pain currently is ***/10 and on average is ***/10. He describes the sensation of his pain as {pain described:58928}. His pain is present {pain frequency:58931} and worst {pain worst:59685}. The patient???s pain impacts {negatively affected:59709}. His interval history includes {interval history:59710}. His pain {pain changed:59711}, and he {does does not blank:47747} have new pain to discuss today. He {is/is not:36772} on blood thinners or anti-coagulants. In regards to medications currently taken for pain management, the patient {is:54246::is} tolerating these medications well and complains of associated side effects: {pain med side effects:59712}.    Patient denies homicidal/suicidal ideation.    Previous Medication Trials:  NSAIDS- ibuprofen  Antidepressants-   Anticonvulsant- gabapentin  Muscle relaxants- flexeril, baclofen  Topicals-voltaren gel  Short-acting opiates-percocet   Long-acting opiates-   Anxiolytics-   Other-     Previous Interventions  Medications, PT, surgery, injections with sports med  TOS decompression/PMR 11/16/21  TPI-05/10/22-very helpful.  Have repeated several times since  PT-2023  Left GTB CSI-sports med 05/27/22  Spinal accessory nerve block-11/04/22  Spinal accessory nerve block and pulsed RFA-12/29/22    Allergies as of 07/12/2023 - Reviewed 07/07/2023   Allergen Reaction Noted    Latex Rash and Hives 09/16/2016    Penicillins Anaphylaxis 09/16/2016    Shellfish containing products Anaphylaxis 01/23/2017    Other Rash 12/16/2020      Current Outpatient Medications   Medication Sig Dispense Refill    albuterol 2.5 mg /3 mL (0.083 %) nebulizer solution Inhale 3 mL (2.5 mg total) by nebulization every six (6) hours as needed for wheezing or shortness of breath. 90 mL 1    azelastine (ASTELIN) 137 mcg (0.1 %) nasal spray Instill 2 sprays into each nostril two (2) times a day as needed for rhinitis. 30 mL 1    budesonide (PULMICORT) 0.5 mg/2 mL nebulizer solution Use one vial twice daily 120 mL 11    budesonide-formoterol (SYMBICORT) 160-4.5 mcg/actuation inhaler Inhale 1 puff by mouth Two (2) times a day. 10.2 g 5    cetirizine (ZYRTEC) 10 MG tablet Take 1 tablet (10 mg total) by mouth daily. 30 tablet 12    clindamycin (CLEOCIN) 2 % vaginal cream Insert 1 applicator into the vagina nightly for 7 days. 40 g 0    clonazePAM (KLONOPIN) 0.5 MG tablet Take 0.5 tablets (0.25 mg total) by mouth two (2) times a day as needed for anxiety. 30 tablet 0    COVID-19 antigen test ([facility] COVID-19 AG HOME TEST) Kit Test as directed 8 kit PRN    diclofenac sodium (VOLTAREN) 1 % gel Apply 2 g topically four (4) times a day. 100 g 0    emtricitabine-tenofovir alafen (DESCOVY) 200-25 mg tablet Take 1 tablet by mouth daily. 90 tablet 0    EPINEPHrine (EPIPEN) 0.3 mg/0.3 mL injection Inject 0.3 mL (0.3 mg total) under the skin once for 1 dose. (Patient not taking: Reported on 07/06/2023) 2 each 0    famotidine (PEPCID) 40 MG tablet Take 1 tablet (40 mg total) by mouth every evening. 30 tablet 1    gabapentin (NEURONTIN) 250 mg/5 mL oral solution Take 12 mL (  600 mg total) by mouth Three (3) times a day. 1080 mL 2    gabapentin (NEURONTIN) 250 mg/5 mL oral solution Take 12 mL (600 mg total) by mouth Three (3) times a day. 1080 mL 1    inhalational spacing device Spcr Use as directed with albuterol and symbicort 1 each 1    ipratropium (ATROVENT) 42 mcg (0.06 %) nasal spray 1-2 sprays in each nostril as needed for drainage, up to 4 time daily. 15 mL 2    metoclopramide (REGLAN) 10 MG tablet Take 1/2 tablet (5 mg total) by mouth Three (3) times a day with a meal. 90 tablet 0    nebulizer and compressor (COMP-AIR NEBULIZER COMPRESSOR) Devi 1 each by Miscellaneous route nightly as needed. 1 each 0    needle, disp, 25 Daijah (BD REGULAR BEVEL NEEDLES) 25 Valeta x 5/8 Ndle For subcutaneous hormone injection. 25 each 0    nifedipine 0.3% lidocaine 1.5% in petrolatum ointment Apply a pea-size amount to anal area 3 times a day 100 g 0    omalizumab (XOLAIR) 300 mg/2 mL auto-injector Inject the contents of 1 pen (300 mg) under the skin every fourteen (14) days. 4 mL 11    omalizumab 75 mg/0.5 mL AtIn Inject the contents of 1 pen (75 mg) under the skin every fourteen (14) days. 1 mL 11    pantoprazole (PROTONIX) 40 MG tablet Take 1 tablet (40 mg total) by mouth daily. 90 tablet 3    propranolol (INDERAL) 10 MG tablet Take 1 tablet (10 mg total) by mouth two (2) times a day. 60 tablet 11    prucalopride 2 mg Tab Take 1 tablet (2 mg total) by mouth daily. 90 tablet 0    safety needles (BD ECLIPSE) 25 Shahed x 1 Ndle use for testosterone 12 each 0    safety needles (BD SAFETYGLIDE NEEDLE) 18 Oria x 1 1/2 Ndle For drawing hormone injection 25 each 0    syringe, disposable, (EASY TOUCH LUER LOCK SYRINGE) 1 mL Syrg Use for weekly hormone injection. 4 each 0    syringe, disposable, (EASY TOUCH LUER LOCK SYRINGE) 1 mL Syrg Use for testosterone 12 each 0    syringe, disposable, 2.5 mL Syrg Use weekly 30 Syringe 2    testosterone cypionate (DEPOTESTOTERONE CYPIONATE) 200 mg/mL injection Inject 0.2 mL (40 mg total) into the muscle once a week. 5 mL 0    triamcinolone (KENALOG) 0.1 % cream Apply topically Two (2) times a day. 15 g 0     Current Facility-Administered Medications   Medication Dose Route Frequency Provider Last Rate Last Admin    omalizumab [name]) injection 300 mg  300 mg Subcutaneous Q28 Days [name], [name], [name]   300 mg at 05/24/22 1138       Imaging/Tests:     MRI RLE 09/14/22  FINDINGS:        Bones: No fractures. No marrow edema or bone lesions.      Right hip: No subluxation. No effusion or intra-articular bodies.  No acetabular retroversion. Femoral head/neck offset is within normal limits.  No high-grade chondral defect. The lateral acetabular labrum appears irregular with questionable chondrolabral cleft.      Other Joints: The visualized lumbar spine, sacroiliac joints, symphysis pubis, and contralateral hip are unremarkable.      Musculature and tendons: No intramuscular edema or fatty atrophy.  Intact gluteus medius and minimus tendons. Intact hamstring, rectus femoris and iliopsoas tendons. Ischiofemoral spaces are within normal limits.  Pelvic Cavity: Intrapelvic contents are unremarkable.      Other: Visualized sciatic nerves are unremarkable.  No bursitis.  No subcutaneous mass or fluid collection.   IMPRESSION  -- Suspected right acetabular labral tear.  Consider MR arthrography for further evaluation.     MRI LLE 09/14/22  FINDINGS:     Evaluation of the bone marrow reveals normal marrow signal throughout the visualized bones of the pelvis, proximal femurs and lower lumbar spine.      The musculature of the pelvis is normal and symmetric.  Tendon origins are normal.  No evidence of bursitis.      The sacroiliac and hip joints are normal.      The sciatic nerves are normal.  Vasculature of the pelvis is normal.      Intrapelvic contents are normal.   IMPRESSION  Normal MRI of the left hip.       Lab Results   Component Value Date    PLT 316 01/19/2023     Lab Results   Component Value Date    CREATININE 0.91 01/19/2023     Lab Results   Component Value Date    ALKPHOS 110 01/19/2023    BILITOT 0.5 01/19/2023    BILIDIR 0.20 01/19/2023    PROT 7.9 01/19/2023    ALBUMIN 4.3 01/19/2023    ALT 10 01/19/2023    AST 18 01/19/2023     ROS:  General {ajlrosgen:46920}  Cardiovascular {aggROSCV:37303}  Gastrointestinal {aggROSGI:37304}  Skin {aggROSskin:37305}  Endocrine {aggROSendo:37306}  Musculoskeletal {aggROSmusk:37307}  Neurologic {aggROSneu:37308}  Psychiatric {aggROSpsych:37309}     PHYSICAL EXAM:  LMP 07/20/2017 (Exact Date)     Wt Readings from Last 3 Encounters:   06/27/23 70.9 kg (156 lb 3.2 oz)   06/22/23 70.8 kg (156 lb)   06/19/23 72.4 kg (159 lb 9.6 oz)     GENERAL:  The patient is well developed, well-nourished, and appears to be in no apparent distress.   HEAD/NECK:    Normocephalic/atraumatic. clear sclera, pupils not pinpoint  CV:  Warm and well-perfused.  LUNGS:   Normal work of breathing, no supplemental O2  EXTREMITIES:  No clubbing, cyanosis noted.  NEUROLOGIC:    The patient is alert and oriented, speech fluent, normal language.   MUSCULOSKELETAL:    Motor function  preserved.    GAIT:  The patient rises from a seated position with no difficulty and ambulates with nonantalgic gait without the assistance of a walking aid.   SKIN:   No obvious rashes, lesions, or erythema.  PSY:   Appropriate affect. No overt pain behaviors. No evidence of psychomotor retardation or agitation, no signs of intoxication.

## 2023-07-14 DIAGNOSIS — K3184 Gastroparesis: Principal | ICD-10-CM

## 2023-07-14 MED ORDER — PRUCALOPRIDE 2 MG TABLET
ORAL_TABLET | Freq: Every day | ORAL | 0 refills | 90.00 days
Start: 2023-07-14 — End: ?

## 2023-07-14 MED ORDER — METOCLOPRAMIDE 10 MG TABLET
ORAL_TABLET | Freq: Three times a day (TID) | ORAL | 0 refills | 60.00 days
Start: 2023-07-14 — End: ?

## 2023-07-14 MED FILL — XOLAIR 300 MG/2 ML SUBCUTANEOUS AUTO-INJECTOR: SUBCUTANEOUS | 28 days supply | Qty: 4 | Fill #5

## 2023-07-14 MED FILL — XOLAIR 75 MG/0.5 ML SUBCUTANEOUS AUTO-INJECTOR: SUBCUTANEOUS | 28 days supply | Qty: 1 | Fill #5

## 2023-07-14 NOTE — Unmapped (Signed)
[facility] THERAPY SERVICES AT [facility]  OUTPATIENT PHYSICAL THERAPY  07/14/2023  Note Type: Treatment Note       Patient Name: Vincent Waters  Date of Birth:March 28, 1981  Diagnosis:   Encounter Diagnoses   Name Primary?    Neck pain on right side Yes    Chronic right shoulder pain      Referring MD:  Dawna Part, *     Date of Onset of Impairment-09/18/2016  Date PT Care Plan Established or Reviewed-11/07/2022  Date PT Treatment Started-05/06/2022   Visit Count: 21  Plan of Care Effective Date:          Assessment/Plan:    Assessment  Assessment details:    Vincent Waters is much less symptomatic compared to the previous session.  Continuing to focus on manual therapy to improve symptoms.  Afterwards, he stated he felt better.  Encouraged him to continue with his Home Exercise Program.  He agreed to do this.  All questions answered.  This patient requires skilled physical therapy services to address the outlined impairments in order to return to his desired level of function.           Impairments: decreased strength, hypertonicity, pain and postural weakness                      Therapy Goals      Goals:      Short Term Goals: Updated 11/07/2022.   In 6 weeks:  Goal #1: Patient will be independent with progressively monitored home exercise program.   Baseline/Current: Patient is familiar with home exercise program and performs it consistently.     Goal #2: Patient will demonstrate cervical ROM within normal limits with minimal symptoms in order to allow for functional activities such as safe driving and looking before crossing the street.   Baseline/Current: COMPLETE.  Patient now has normal cervical ROM (see ROM testing).     Goal #3: Patient will demonstrate right shoulder range of motion within normal limits with minimal symptoms for proper glenohumeral joint mechanics in order to allow for daily functional tasks.   Baseline/Current: COMPLETE.  Patient has normal right shoulder range of motion (see ROM testing).     Goal #4: Patient will demonstrate improved posture without cuing.  Baseline/Current: PARTIALLY COMPLETE.  Patient has intermittently improved posture with right shoulder still sitting forward and ups.    Long Term Goals: Updated 11/07/2022.   In 12 weeks:  Goal #1: Patient will demonstrate right shoulder strength within normal limits with minimal symptoms in order to allow for pain free lifting and reaching with right upper extremity.   Baseline/Current: NEARLY COMPLETE.  Patient has significantly improved right shoulder strength with residual weakness (see manual muscle testing).     Goal #2: Patient will be able to reach over head with minimal symptoms in order to place/remove an object over shoulder height in a cabinet.  Baseline/Current: COMPLETE.  Patient can now reach over head without issue.    Goal #3: Patient will return to recreational activities (working out in gym) without limit.  Baseline/Current: PARTIALLY COMPLETE.  Patient has returned to limited weight lifting.    Goal #4: Patient's FOTO score will improve from 34 at initial evaluation to >/= 52 at discharge in order to demonstrate a significant improvement in self-reported outcome measures that directly result from his course of physical therapy.   Baseline/Current: IN PROGRESS.  FOTO = 34    Plan    Therapy  options: will be seen for skilled physical therapy services    Planned therapy interventions: Body Mechanics Training, Dry Needling, Education - Patient, Home Exercise Program, Manual Therapy, Neuromuscular Re-education, Postural Training, Therapeutic Activities and Therapeutic Exercises      Frequency: 1x week    Duration in weeks: 6    Education provided to: patient.    Education provided: HEP, Estate manager/land agent, Treatment options and plan, Symptom management and Body awareness    Education results: verbalized good understanding, demonstrates understanding and needs further instruction.    Communication/Consultation: N/A.    Next visit plan:        Posterior shoulder/depressor strengthening. Manual Therapy as needed.    Total Session Time: 45    Treatment rendered today:      Manual Therapy: 40 minutes  - Soft Tissue Mobilization Right Pectoralis Minor  - Pin and Stretch Right Pec  - GHJ AP in Various Amounts of Abduction  - GHJ in PROM IR  - Side Lying Pec/Quick Stretches  - Right proximal 1st rib mobilizations with scalene pin and stretch   Intramuscular Therapy: 5 minutes  Vincent Waters  was informed of the risks associated with dry needling and he signed consent form.  Patient screened for any contraindications to needling including pregnancy, infection, antibiotic use, anticoagulants use, autoimmune issues, uncontrolled diabetes, and active cancer/treatment and does not have any of these conditions.  DRY NEEDLING FOR RELEASING IDENTIFIED TRIGGER POINTS TO FACILITATE MANUAL THERAPY TECHNIQUES AND ALLOW MUSCLE TO RETURN TO OPTIMAL LENGTH (NO CHARGE).  Right upper Trapezius trigger point with 50mm x .30mm with Pinch/Pull Technique established for safety - Local Twitch Response produced  Right SCM trigger point with 30mm x .30mm with Pinch/Pull Technique established for safety - Local Twitch Response produced     Right Scalenes trigger point with 30mm x .30mm with Flat Palpation/Direct Approach established for safety - Local Twitch Response produced   Right Pec Minor trigger point with 50mm x .30mm with Pinch/Pull Technique established for safety - Local Twitch Response produced    Plan details: Access Code: [email or web address]  URL: [email or web address]  Date: 05/11/2023  Prepared by: Vincent Waters    Exercises  - Standing Shoulder Horizontal Abduction with Resistance  - 1 x daily - 7 x weekly - 2 sets - 10 reps - 1-2 hold  - Shoulder External Rotation and Scapular Retraction with Resistance  - 1 x daily - 7 x weekly - 2 sets - 10 reps  - Standing Shoulder flexion Wall Slide (Mirrored)  - 1 x daily - 7 x weekly - 1 sets - 15 reps  - Seated Shoulder Scaption AAROM with Pulley at Side (Mirrored)  - 1 x daily - 7 x weekly - 3 sets - 10 reps  - Seated Upper Trapezius Stretch  - 1 x daily - 7 x weekly - 1 sets - 3 reps - 30 hold  - Standing Shoulder Internal Rotation Stretch with Hands Behind Back (Mirrored)  - 1 x daily - 7 x weekly - 1 sets - 15 reps - 5 hold  - Sleeper Stretch (Mirrored)  - 1 x daily - 7 x weekly - 1 sets - 10 reps - 10 hold  - Sidelying Shoulder Horizontal Abduction  - 1 x daily - 7 x weekly - 2 sets - 10 reps  - Chair Press Ups Forward and Backward  - 1 x daily - 7 x weekly - 1 sets - 10 reps - 5 hold  - Supine  Pectoralis Stretch  - 1 x daily - 7 x weekly - 1 sets - 10 reps - 10 hold  - Prone Single Arm Shoulder Horizontal Abduction with Dumbbell - Palm Down  - 1 x daily - 7 x weekly - 2 sets - 10 reps  - Low Trap Setting at Wall  - 1 x daily - 7 x weekly - 2 sets - 10 reps - 5 hold  - Shoulder extension with resistance - Neutral  - 1 x daily - 7 x weekly - 2 sets - 10 reps  - CLX Tricep Pressdowns  - 1 x daily - 7 x weekly - 2 sets - 10 reps      Subjective:     History of Present Condition     Date of surgery:  11/16/2021    Date of onset:  09/18/2016    Date is approximation?: yes    Explanation:  Patient reports onset of symptoms in early 2018.       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 Arave is a 43 y.o. right hand dominant adult who presents to outpatient physical therapy with right neck and shoulder pain that began in early 2018 when he was changing the oil in his car when it fell onto his sternum.  He went to the ER and got an x-ray which showed a sternal fracture.  Since then, he has had persistent issues and nerve pressure in his right neck/shoulder which led him to get a right anterior scalenectomy, middle scalenectomy, first rib resection and right Pectoralis minor release surgery performed on 11/16/2021 by Thora Lance, MD.  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  Subjective:  Mr. Klute reports that last session helped release his shoulder for a couple of weeks.  It did start to tighten up since then.  He has been doing his exercises consistently.  Pain:     Current pain rating:  3  Location:  Right neck/shoulder    Quality:  Tight and sore    Progression:  Improved      Objective:       Palpation     Right Shoulder   Hypertonic in the pectoralis minor and upper trapezius.   Tenderness of the pectoralis minor and upper trapezius.     Right Cervical/Thoracic   Hypertonic in the pectoralis minor, scalenes, sternocleidomastoid and upper trapezius.   Tenderness of the pectoralis minor and upper trapezius.     Joint Mobility  Shoulder  Right Shoulder Joint Mobility    Posterior capsule: hypomobile                                  I attest that I have reviewed the above information.  Signed: Patton Salles, PT  07/14/2023 1:32 PM

## 2023-07-17 MED ORDER — METOCLOPRAMIDE 10 MG TABLET
ORAL_TABLET | Freq: Three times a day (TID) | ORAL | 3 refills | 90.00 days | Status: CP
Start: 2023-07-17 — End: ?
  Filled 2023-07-18: qty 135, 90d supply, fill #0

## 2023-07-17 MED ORDER — PRUCALOPRIDE 2 MG TABLET
ORAL_TABLET | Freq: Every day | ORAL | 3 refills | 90.00 days | Status: CP
Start: 2023-07-17 — End: ?
  Filled 2023-07-18: qty 90, 90d supply, fill #0

## 2023-07-17 NOTE — Unmapped (Signed)
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   Perceptual disturbances:     Denies auditory and visual hallucinations, behavior not concerning for response to internal stimuli     Orientation:   Oriented to person, place, time, and general circumstances   Attention:   Able to fully attend without fluctuations in consciousness   Concentration:   Able to fully concentrate and attend   Memory:   Immediate, short-term, long-term, and recall grossly intact    Fund of knowledge:    Consistent with level of education and development   Insight:     Intact   Judgment:    Intact   Impulse Control:   Intact     PE:   The patient sat comfortably in chair.  The patient's breathing was observed to be comfortable and normal.  The patient is in no acute distress.  All extremity movements appear intact with normal strength and no abnormalities.   There are no focal neurological deficits observed.     Test Results:  Data Review:   Office Visit on 06/27/2023   Component Date Value Ref Range Status    Bacterial Vaginitis 06/27/2023 Intermediate (A)  Negative Final    Yeast Screen 06/27/2023 No Budding Yeast  No Budding Yeast Final    Trichomonas Scrn 06/27/2023 Negative  Negative Final    Chlamydia trachomatis, NAA 06/27/2023 Negative  Negative Final    Gonorrhoeae NAA 06/27/2023 Negative  Negative Final    CT/GC Specimen Type 06/27/2023 Swab   Final    CT/GC Specimen Source 06/27/2023 Patient-collected Vaginal Swab   Final   Office Visit on 06/19/2023   Component Date Value Ref Range Status    HCG Urine, POC 06/19/2023 Negative  Negative Final    Chlamydia trachomatis, NAA 06/19/2023 Negative  Negative Final    Gonorrhoeae NAA 06/19/2023 Negative  Negative Final    CT/GC Specimen Type 06/19/2023 Swab   Final    CT/GC Specimen Source 06/19/2023 Throat   Final    Adenovirus 06/19/2023 Not Detected  Not Detected Final    Coronavirus HKU1 06/19/2023 Not Detected  Not Detected Final    Coronavirus NL63 06/19/2023 Not Detected  Not Detected Final    Coronavirus 229E 06/19/2023 Not Detected  Not Detected Final    Coronavirus OC43 PCR 06/19/2023 Not Detected  Not Detected Final    Metapneumovirus 06/19/2023 Not Detected  Not Detected Final    Rhinovirus/Enterovirus 06/19/2023 Not Detected  Not Detected Final    Influenza A 06/19/2023 Not Detected  Not Detected Final    Influenza B 06/19/2023 Not Detected  Not Detected Final    Parainfluenza 1 06/19/2023 Not Detected  Not Detected Final    Parainfluenza 2 06/19/2023 Not Detected  Not Detected Final    Parainfluenza 3 06/19/2023 Not Detected  Not Detected Final    Parainfluenza 4 06/19/2023 Not Detected  Not Detected Final    RSV 06/19/2023 Not Detected  Not Detected Final    Bordetella pertussis 06/19/2023 Not Detected  Not Detected Final    Bordetella parapertussis 06/19/2023 Not Detected  Not Detected Final    Chlamydophila (Chlamydia) pneumoni* 06/19/2023 Not Detected  Not Detected Final    Mycoplasma pneumoniae 06/19/2023 Not Detected  Not Detected Final    SARS-CoV-2 PCR 06/19/2023 Not Detected  Not Detected Final    Bacterial Vaginitis 06/19/2023 Intermediate (A)  Negative Final    Yeast Screen 06/19/2023 No Budding Yeast  No Budding Yeast Final    Rapid Strep A Screen 06/19/2023 Negative  Negative Final    Rapid Strep A Internal QC 06/19/2023 QC Acceptable   Final    Rapid Strep A Lot 06/19/2023 161,096   Final    Rapid Strep A Expiration Date 06/19/2023 04/17/2024   Final    Mycoplasma genitalium NAA 06/19/2023 Negative  Negative Final    Chlamydia trachomatis, NAA 06/19/2023 Negative  Negative Final    Gonorrhoeae NAA 06/19/2023 Negative  Negative Final    CT/GC Specimen Type 06/19/2023 Swab   Final    CT/GC Specimen Source 06/19/2023 Clinician-collected Vaginal Swab   Final    Color, UA 06/19/2023 Light Yellow   Final    Clarity, UA 06/19/2023 Clear   Final    Specific Gravity, UA 06/19/2023 1.006  1.003 - 1.030 Final    pH, UA 06/19/2023 7.0  5.0 - 9.0 Final    Leukocyte Esterase, UA 06/19/2023 Small (A)  Negative Final    Nitrite, UA 06/19/2023 Negative  Negative Final    Protein, UA 06/19/2023 Negative  Negative Final  Glucose, UA 06/19/2023 Negative  Negative Final    Ketones, UA 06/19/2023 Negative  Negative Final    Urobilinogen, UA 06/19/2023 <2.0 mg/dL  <1.0 mg/dL Final    Bilirubin, UA 06/19/2023 Negative  Negative Final    Blood, UA 06/19/2023 Negative  Negative Final    RBC, UA 06/19/2023 1  <=3 /HPF Final    WBC, UA 06/19/2023 5 (H)  <=2 /HPF Final    Squam Epithel, UA 06/19/2023 <1  0 - 5 /HPF Final    Bacteria, UA 06/19/2023 Rare (A)  None Seen /HPF Final    Urine Culture, Comprehensive 06/19/2023 Mixed Urogenital Flora   Final   Office Visit on 06/16/2023   Component Date Value Ref Range Status    Chlamydia trachomatis, NAA 06/16/2023 Negative  Negative Final    Gonorrhoeae NAA 06/16/2023 Negative  Negative Final    CT/GC Specimen Type 06/16/2023 Urine   Final    CT/GC Specimen Source 06/16/2023 Urine   Final    Bacterial Vaginitis 06/16/2023 Negative  Negative Final    Yeast Screen 06/16/2023 No Budding Yeast  No Budding Yeast Final    Trichomonas Scrn 06/16/2023 Negative  Negative Final   Admission on 03/10/2023, Discharged on 03/10/2023   Component Date Value Ref Range Status    Diagnosis 03/10/2023    Final                    Value:A.  Stomach, biopsy:  -Oxyntic and antral mucosa with mild chronic inactive gastritis and mucosal erosion.  -Negative for Helicobacter organisms on H&E stain.    B.  Esophagus, biopsy:  -Gastric and squamous mucosa, negative for eosinophils.    This electronic signature is attestation that the pathologist personally reviewed the submitted material(s) and the final diagnosis reflects that evaluation.      Clinical History 03/10/2023    Final                    Value:Pre-op diagnosis:  Regurgitation of food [R11.10]  Heartburn [R12]  Gastroparesis [K31.84]      Gross Description 03/10/2023    Final                    Value:A.     Labeled: Stomach, rule out Helicobacter pylori  Measurement: 6 x 5 x 3 mm  Comment: Aggregate of three fragments of nodular tan tissue  Block A1 NTR  B.    Labeled: Esophagus, rule out EOE  Measurement: 8 x 4 x 3 mm  Comment: Aggregate of four fragments of soft tan tissue  Block B1 NTR          Microscopic Description 03/10/2023    Final                    Value:Microscopic examination substantiates the above diagnosis.      Disclaimer 03/10/2023    Final                    Value:Unless otherwise specified, specimens are preserved using 10% neutral buffered formalin. For cases in which immunohistochemical and/or in-situ hybridization stains are performed, the following statement applies: Appropriate controls for each stain (positive controls with or without negative controls) have been evaluated and stain as expected. These stains have not been separately validated for use on decalcified specimens and should be interpreted with caution in that setting. Some of the reagents used for these stains may be classified as analyte  specific reagents (ASR). Tests using ASRs were developed, and their performance characteristics were determined, by the Anatomic Pathology Department [facility]). They have not been cleared or approved by the [place] Food and Drug Administration (FDA). The FDA does not require these tests to go through premarket FDA review. These tests are used for clinical purposes. They should not be regarded as investigational or for                           research. This laboratory is certified under the Clinical Laboratory Improvement Amendments (CLIA) as qualified to perform high complexity clinical laboratory testing.     Office Visit on 02/28/2023   Component Date Value Ref Range Status    Chlamydia trachomatis, NAA 02/28/2023 Negative  Negative Final    Gonorrhoeae NAA 02/28/2023 Negative  Negative Final    CT/GC Specimen Type 02/28/2023 Urine   Final    CT/GC Specimen Source 02/28/2023 Urine   Final    Chlamydia trachomatis, NAA 02/28/2023 Negative  Negative Final    Gonorrhoeae NAA 02/28/2023 Negative  Negative Final    CT/GC Specimen Type 02/28/2023 Swab   Final    CT/GC Specimen Source 02/28/2023 Throat   Final    RPR 02/28/2023 Nonreactive  Nonreactive Final    Testosterone 02/28/2023 515 (H)  Male: 56-35, Male: 197-670 ng/dL Final    HIV Antigen/Antibody Combo 02/28/2023 Nonreactive  Nonreactive Final    Amphetamines Screen, Ur 02/28/2023 Negative  <500 ng/mL Final    Barbiturates Screen, Ur 02/28/2023 Negative  <200 ng/mL Final    Benzodiazepines Screen, Urine 02/28/2023 Negative  <200 ng/mL Final    Cannabinoids Screen, Ur 02/28/2023 Negative  <20 ng/mL Final    Methadone Screen, Urine 02/28/2023 Negative  <300 ng/mL Final    Cocaine(Metab.)Screen, Urine 02/28/2023 Negative  <150 ng/mL Final    Opiates Screen, Ur 02/28/2023 Negative  <300 ng/mL Final    Fentanyl Screen, Ur 02/28/2023 Negative  <1.0 ng/mL Final    Oxycodone Screen, Ur 02/28/2023 Negative  <100 ng/mL Final    Buprenorphine, Urine 02/28/2023 Negative  <5 ng/mL Final   Office Visit on 01/24/2023   Component Date Value Ref Range Status    Chlamydia trachomatis, NAA 01/24/2023 Negative  Negative Final    Gonorrhoeae NAA 01/24/2023 Negative  Negative Final    CT/GC Specimen Type 01/24/2023 Urine   Final    CT/GC Specimen Source 01/24/2023 Urine   Final   Office Visit on 01/19/2023   Component Date Value Ref Range Status    Creatinine 01/19/2023 0.91  Male: 0.55-1.02, Male: 0.73-1.18 mg/dL Final    eGFR CKD-EPI (2021) Male 01/19/2023 >90  >=60 mL/min/1.74m2 Final    eGFR CKD-EPI (2021) Male 01/19/2023 82  >=60 mL/min/1.66m2 Final    Albumin 01/19/2023 4.3  3.4 - 5.0 g/dL Final    Total Protein 01/19/2023 7.9  5.7 - 8.2 g/dL Final    Total Bilirubin 01/19/2023 0.5  0.3 - 1.2 mg/dL Final    Bilirubin, Direct 01/19/2023 0.20  0.00 - 0.30 mg/dL Final    AST 16/03/9603 18  <=34 U/L Final    ALT 01/19/2023 10  10 - 49 U/L Final    Alkaline Phosphatase 01/19/2023 110  46 - 116 U/L Final    Sed Rate 01/19/2023 17 (H)  0 - 15 mm/h Final    CRP 01/19/2023 7.0  <=10.0 mg/L Final    WBC 01/19/2023 5.7  3.6 - 11.2 10*9/L Final  RBC 01/19/2023 5.61 (H)  4.11 - 5.36 10*12/L Final    HGB 01/19/2023 14.9  12.1 - 15.7 g/dL Final    HCT 16/03/9603 45.0  36.0 - 46.0 % Final    MCV 01/19/2023 80.3  77.6 - 95.7 fL Final    MCH 01/19/2023 26.5  25.9 - 32.4 pg Final    MCHC 01/19/2023 33.0  32.0 - 36.0 g/dL Final    RDW 54/02/8118 14.6  12.2 - 15.2 % Final    MPV 01/19/2023 7.6  6.8 - 10.7 fL Final    Platelet 01/19/2023 316  150 - 450 10*9/L Final    Neutrophils % 01/19/2023 57.4  % Final    Lymphocytes % 01/19/2023 35.6  % Final    Monocytes % 01/19/2023 5.2  % Final    Eosinophils % 01/19/2023 0.8  % Final    Basophils % 01/19/2023 1.0  % Final    Absolute Neutrophils 01/19/2023 3.3  1.8 - 7.8 10*9/L Final    Absolute Lymphocytes 01/19/2023 2.0  1.1 - 3.6 10*9/L Final    Absolute Monocytes 01/19/2023 0.3  0.3 - 0.8 10*9/L Final    Absolute Eosinophils 01/19/2023 0.0  0.0 - 0.5 10*9/L Final    Absolute Basophils 01/19/2023 0.1  0.0 - 0.1 10*9/L Final     Imaging: None    Psychometrics:         The patient reports they are physically located in [place] and is currently: not at home. (In car, parked) I conducted a audio/video visit. I spent  12m 00s on the video call with the patient. I spent an additional . on pre- and post-visit activities on the date of service .       I personally spent 71 minutes face-to-face and non-face-to-face in the care of this patient, which includes all pre, intra, and post visit time on the date of service. All documented time was specific to E/M and does not include any procedures that may have been performed.    Keane Scrape, PMHNP  July 19, 2023 12:16 PM

## 2023-07-17 NOTE — Unmapped (Signed)
Patient has been seen within past year. Refill for Motegrity and Reglan sent to pharmacy on file.

## 2023-07-19 ENCOUNTER — Encounter
Admit: 2023-07-19 | Discharge: 2023-07-20 | Payer: BLUE CROSS/BLUE SHIELD | Attending: Psychiatric/Mental Health | Primary: Psychiatric/Mental Health

## 2023-07-19 ENCOUNTER — Ambulatory Visit
Admit: 2023-07-19 | Discharge: 2023-07-20 | Payer: BLUE CROSS/BLUE SHIELD | Attending: Student in an Organized Health Care Education/Training Program | Primary: Student in an Organized Health Care Education/Training Program

## 2023-07-19 DIAGNOSIS — Z9013 Acquired absence of bilateral breasts and nipples: Principal | ICD-10-CM

## 2023-07-19 DIAGNOSIS — N760 Acute vaginitis: Principal | ICD-10-CM

## 2023-07-19 DIAGNOSIS — F1320 Sedative, hypnotic or anxiolytic dependence, uncomplicated: Principal | ICD-10-CM

## 2023-07-19 DIAGNOSIS — B9689 Other specified bacterial agents as the cause of diseases classified elsewhere: Principal | ICD-10-CM

## 2023-07-19 DIAGNOSIS — F410 Panic disorder [episodic paroxysmal anxiety] without agoraphobia: Principal | ICD-10-CM

## 2023-07-19 DIAGNOSIS — H6091 Unspecified otitis externa, right ear: Principal | ICD-10-CM

## 2023-07-19 DIAGNOSIS — F411 Generalized anxiety disorder: Principal | ICD-10-CM

## 2023-07-19 DIAGNOSIS — Z90710 Acquired absence of both cervix and uterus: Principal | ICD-10-CM

## 2023-07-19 MED ORDER — DEXTROAMPHETAMINE-AMPHETAMINE 20 MG TABLET
ORAL_TABLET | Freq: Every day | ORAL | 0 refills | 30.00 days | Status: CP
Start: 2023-07-19 — End: 2023-08-18
  Filled 2023-07-20: qty 30, 30d supply, fill #0

## 2023-07-19 MED ORDER — CIPROFLOXACIN 0.3 %-DEXAMETHASONE 0.1 % EAR DROPS,SUSPENSION
Freq: Two times a day (BID) | OTIC | 0 refills | 19.00 days | Status: CP
Start: 2023-07-19 — End: 2023-07-26
  Filled 2023-07-20: qty 7.5, 7d supply, fill #0

## 2023-07-19 MED ORDER — CLONAZEPAM 0.5 MG TABLET
ORAL_TABLET | Freq: Two times a day (BID) | ORAL | 0 refills | 30 days | Status: CP | PRN
Start: 2023-07-19 — End: 2023-09-17
  Filled 2023-08-04: qty 30, 30d supply, fill #0

## 2023-07-19 MED ORDER — CLINDAMYCIN 2 % VAGINAL CREAM
Freq: Every evening | VAGINAL | 0 refills | 8.00 days | Status: CP
Start: 2023-07-19 — End: 2023-07-27
  Filled 2023-07-20: qty 40, 8d supply, fill #0

## 2023-07-19 NOTE — Unmapped (Addendum)
Follow-up instructions:  -- Please continue taking your medications as prescribed for your mental health.   -- Do not make changes to your medications, including taking more or less than prescribed, unless under the supervision of your physician. Be aware that some medications may make you feel worse if abruptly stopped.  -- Please refrain from using illicit substances, as these can affect your mood and could cause anxiety or other concerning symptoms.   -- Seek further medical care for any increase in symptoms or new symptoms such as thoughts of wanting to hurt yourself or hurt others.     Contact info:  Life-threatening emergencies: call 911 or go to the nearest ER for medical or psychiatric attention.     Urgent questions, Non-urgent routine concerns, questions, and refill requests: please call the clinic at [phone].    Regarding appointments:  - If you need to cancel your appointment, we ask that you call  [phone]  at least 24 hours before your scheduled appointment.  - If for any reason you arrive 15 minutes later than your scheduled appointment time, you may not be seen and your visit may be rescheduled.  - Please remember that we will not automatically reschedule missed appointments.  - If you miss two (2) appointments without letting us know in advance, you will likely be referred to a provider in your community.  - We will do our best to be on time. Sometimes an emergency will arise that might cause your clinician to be late. We will try to inform you of this when you check in for your appointment. If you wait more than 15 minutes past your appointment time without such notice, please speak with the front desk staff.    In the event of bad weather, the clinic staff will attempt to contact you, should your appointment need to be rescheduled. Additionally, you can call the Patient Weather Line [phone] for system-wide clinic status     For more information and reminders regarding clinic policies (these were provided when you were admitted to the clinic), please ask the front desk.      Current Outpatient Medications:     albuterol 2.5 mg /3 mL (0.083 %) nebulizer solution, Inhale 3 mL (2.5 mg total) by nebulization every six (6) hours as needed for wheezing or shortness of breath., Disp: 90 mL, Rfl: 1    azelastine (ASTELIN) 137 mcg (0.1 %) nasal spray, Instill 2 sprays into each nostril two (2) times a day as needed for rhinitis., Disp: 30 mL, Rfl: 1    budesonide (PULMICORT) 0.5 mg/2 mL nebulizer solution, Use one vial twice daily, Disp: 120 mL, Rfl: 11    budesonide-formoterol (SYMBICORT) 160-4.5 mcg/actuation inhaler, Inhale 1 puff by mouth Two (2) times a day., Disp: 10.2 g, Rfl: 5    cetirizine (ZYRTEC) 10 MG tablet, Take 1 tablet (10 mg total) by mouth daily., Disp: 30 tablet, Rfl: 12    clonazePAM (KLONOPIN) 0.5 MG tablet, Take 0.5 tablets (0.25 mg total) by mouth two (2) times a day as needed for anxiety., Disp: 30 tablet, Rfl: 0    COVID-19 antigen test ([facility] COVID-19 AG HOME TEST) Kit, Test as directed, Disp: 8 kit, Rfl: PRN    dextroamphetamine-amphetamine (ADDERALL) 20 mg tablet, Take 1 tablet (20 mg total) by mouth daily., Disp: 30 tablet, Rfl: 0    [START ON 08/18/2023] dextroamphetamine-amphetamine (ADDERALL) 20 mg tablet, Take 1 tablet (20 mg total) by mouth daily., Disp: 30 tablet, Rfl: 0    [  START ON 09/17/2023] dextroamphetamine-amphetamine (ADDERALL) 20 mg tablet, Take 1 tablet (20 mg total) by mouth daily., Disp: 30 tablet, Rfl: 0    diclofenac sodium (VOLTAREN) 1 % gel, Apply 2 g topically four (4) times a day., Disp: 100 g, Rfl: 0    emtricitabine-tenofovir alafen (DESCOVY) 200-25 mg tablet, Take 1 tablet by mouth daily., Disp: 90 tablet, Rfl: 0    EPINEPHrine (EPIPEN) 0.3 mg/0.3 mL injection, Inject 0.3 mL (0.3 mg total) under the skin once for 1 dose. (Patient not taking: Reported on 07/06/2023), Disp: 2 each, Rfl: 0    famotidine (PEPCID) 40 MG tablet, Take 1 tablet (40 mg total) by mouth every evening., Disp: 30 tablet, Rfl: 1    gabapentin (NEURONTIN) 250 mg/5 mL oral solution, Take 12 mL (600 mg total) by mouth Three (3) times a day., Disp: 1080 mL, Rfl: 2    gabapentin (NEURONTIN) 250 mg/5 mL oral solution, Take 12 mL (600 mg total) by mouth Three (3) times a day., Disp: 1080 mL, Rfl: 1    inhalational spacing device Spcr, Use as directed with albuterol and symbicort, Disp: 1 each, Rfl: 1    ipratropium (ATROVENT) 42 mcg (0.06 %) nasal spray, 1-2 sprays in each nostril as needed for drainage, up to 4 time daily., Disp: 15 mL, Rfl: 2    metoclopramide (REGLAN) 10 MG tablet, Take 1/2 tablet (5 mg total) by mouth Three (3) times a day with a meal., Disp: 135 tablet, Rfl: 3    nebulizer and compressor (COMP-AIR NEBULIZER COMPRESSOR) Devi, 1 each by Miscellaneous route nightly as needed., Disp: 1 each, Rfl: 0    needle, disp, 25 Loran (BD REGULAR BEVEL NEEDLES) 25 Leoni x 5/8 Ndle, For subcutaneous hormone injection., Disp: 25 each, Rfl: 0    nifedipine 0.3% lidocaine 1.5% in petrolatum ointment, Apply a pea-size amount to anal area 3 times a day, Disp: 100 g, Rfl: 0    omalizumab (XOLAIR) 300 mg/2 mL auto-injector, Inject the contents of 1 pen (300 mg) under the skin every fourteen (14) days., Disp: 4 mL, Rfl: 11    omalizumab 75 mg/0.5 mL AtIn, Inject the contents of 1 pen (75 mg) under the skin every fourteen (14) days., Disp: 1 mL, Rfl: 11    pantoprazole (PROTONIX) 40 MG tablet, Take 1 tablet (40 mg total) by mouth daily., Disp: 90 tablet, Rfl: 3    propranolol (INDERAL) 10 MG tablet, Take 1 tablet (10 mg total) by mouth two (2) times a day., Disp: 60 tablet, Rfl: 11    prucalopride (MOTEGRITY) 2 mg Tab, Take 1 tablet (2 mg total) by mouth daily., Disp: 90 tablet, Rfl: 3    safety needles (BD ECLIPSE) 25 Leili x 1 Ndle, use for testosterone, Disp: 12 each, Rfl: 0    safety needles (BD SAFETYGLIDE NEEDLE) 18 Jaziya x 1 1/2 Ndle, For drawing hormone injection, Disp: 25 each, Rfl: 0    syringe, disposable, (EASY TOUCH LUER LOCK SYRINGE) 1 mL Syrg, Use for weekly hormone injection., Disp: 4 each, Rfl: 0    syringe, disposable, (EASY TOUCH LUER LOCK SYRINGE) 1 mL Syrg, Use for testosterone, Disp: 12 each, Rfl: 0    syringe, disposable, 2.5 mL Syrg, Use weekly, Disp: 30 Syringe, Rfl: 2    testosterone cypionate (DEPOTESTOTERONE CYPIONATE) 200 mg/mL injection, Inject 0.2 mL (40 mg total) into the muscle once a week., Disp: 5 mL, Rfl: 0    triamcinolone (KENALOG) 0.1 % cream, Apply topically Two (2) times a day.,  Disp: 15 g, Rfl: 0    Current Facility-Administered Medications:     omalizumab [name]) injection 300 mg, 300 mg, Subcutaneous, Q28 Days, [name], [name], [name], 300 mg at 05/24/22 1138

## 2023-07-19 NOTE — Unmapped (Addendum)
[facility] at [facility]  Established Patient Clinic Note    Assessment/Plan:   Mr.Lanzer is a 43 y.o.male who presents for follow-up.    Problem List Items Addressed This Visit    None  Visit Diagnoses         Bacterial vaginosis    -  Primary    Relevant Medications    clindamycin (CLEOCIN) 2 % vaginal cream      Otitis externa of right ear, unspecified chronicity, unspecified type        Relevant Medications    ciprofloxacin-dexAMETHasone (CIPRODEX) 0.3-0.1 % otic suspension      Pain, dental              - Current meds:   Current Outpatient Medications:     albuterol 2.5 mg /3 mL (0.083 %) nebulizer solution, Inhale 3 mL (2.5 mg total) by nebulization every six (6) hours as needed for wheezing or shortness of breath., Disp: 90 mL, Rfl: 1    azelastine (ASTELIN) 137 mcg (0.1 %) nasal spray, Instill 2 sprays into each nostril two (2) times a day as needed for rhinitis., Disp: 30 mL, Rfl: 1    budesonide (PULMICORT) 0.5 mg/2 mL nebulizer solution, Use one vial twice daily, Disp: 120 mL, Rfl: 11    budesonide-formoterol (SYMBICORT) 160-4.5 mcg/actuation inhaler, Inhale 1 puff by mouth Two (2) times a day., Disp: 10.2 g, Rfl: 5    cetirizine (ZYRTEC) 10 MG tablet, Take 1 tablet (10 mg total) by mouth daily., Disp: 30 tablet, Rfl: 12    ciprofloxacin-dexAMETHasone (CIPRODEX) 0.3-0.1 % otic suspension, Administer 4 drops to the right ear two (2) times a day for 7 days., Disp: 7.5 mL, Rfl: 0    clindamycin (CLEOCIN) 2 % vaginal cream, Insert 1 applicator into the vagina nightly for 7 days., Disp: 40 g, Rfl: 0    clonazePAM (KLONOPIN) 0.5 MG tablet, Take 0.5 tablets (0.25 mg total) by mouth two (2) times a day as needed for anxiety., Disp: 30 tablet, Rfl: 0    COVID-19 antigen test ([facility] COVID-19 AG HOME TEST) Kit, Test as directed, Disp: 8 kit, Rfl: PRN    dextroamphetamine-amphetamine (ADDERALL) 20 mg tablet, Take 1 tablet (20 mg total) by mouth daily., Disp: 30 tablet, Rfl: 0    [START ON 08/18/2023] dextroamphetamine-amphetamine (ADDERALL) 20 mg tablet, Take 1 tablet (20 mg total) by mouth daily., Disp: 30 tablet, Rfl: 0    [START ON 09/17/2023] dextroamphetamine-amphetamine (ADDERALL) 20 mg tablet, Take 1 tablet (20 mg total) by mouth daily., Disp: 30 tablet, Rfl: 0    diclofenac sodium (VOLTAREN) 1 % gel, Apply 2 g topically four (4) times a day., Disp: 100 g, Rfl: 0    emtricitabine-tenofovir alafen (DESCOVY) 200-25 mg tablet, Take 1 tablet by mouth daily., Disp: 90 tablet, Rfl: 0    EPINEPHrine (EPIPEN) 0.3 mg/0.3 mL injection, Inject 0.3 mL (0.3 mg total) under the skin once for 1 dose. (Patient not taking: Reported on 07/06/2023), Disp: 2 each, Rfl: 0    famotidine (PEPCID) 40 MG tablet, Take 1 tablet (40 mg total) by mouth every evening., Disp: 30 tablet, Rfl: 1    gabapentin (NEURONTIN) 250 mg/5 mL oral solution, Take 12 mL (600 mg total) by mouth Three (3) times a day., Disp: 1080 mL, Rfl: 2    gabapentin (NEURONTIN) 250 mg/5 mL oral solution, Take 12 mL (600 mg total) by mouth Three (3) times a day., Disp: 1080 mL, Rfl: 1  inhalational spacing device Spcr, Use as directed with albuterol and symbicort, Disp: 1 each, Rfl: 1    ipratropium (ATROVENT) 42 mcg (0.06 %) nasal spray, 1-2 sprays in each nostril as needed for drainage, up to 4 time daily., Disp: 15 mL, Rfl: 2    metoclopramide (REGLAN) 10 MG tablet, Take 1/2 tablet (5 mg total) by mouth Three (3) times a day with a meal., Disp: 135 tablet, Rfl: 3    nebulizer and compressor (COMP-AIR NEBULIZER COMPRESSOR) Devi, 1 each by Miscellaneous route nightly as needed., Disp: 1 each, Rfl: 0    needle, disp, 25 Mollie (BD REGULAR BEVEL NEEDLES) 25 Holly x 5/8 Ndle, For subcutaneous hormone injection., Disp: 25 each, Rfl: 0    nifedipine 0.3% lidocaine 1.5% in petrolatum ointment, Apply a pea-size amount to anal area 3 times a day, Disp: 100 g, Rfl: 0    omalizumab (XOLAIR) 300 mg/2 mL auto-injector, Inject the contents of 1 pen (300 mg) under the skin every fourteen (14) days., Disp: 4 mL, Rfl: 11    omalizumab 75 mg/0.5 mL AtIn, Inject the contents of 1 pen (75 mg) under the skin every fourteen (14) days., Disp: 1 mL, Rfl: 11    pantoprazole (PROTONIX) 40 MG tablet, Take 1 tablet (40 mg total) by mouth daily., Disp: 90 tablet, Rfl: 3    propranolol (INDERAL) 10 MG tablet, Take 1 tablet (10 mg total) by mouth two (2) times a day., Disp: 60 tablet, Rfl: 11    prucalopride (MOTEGRITY) 2 mg Tab, Take 1 tablet (2 mg total) by mouth daily., Disp: 90 tablet, Rfl: 3    safety needles (BD ECLIPSE) 25 Camber x 1 Ndle, use for testosterone, Disp: 12 each, Rfl: 0    safety needles (BD SAFETYGLIDE NEEDLE) 18 Neema x 1 1/2 Ndle, For drawing hormone injection, Disp: 25 each, Rfl: 0    syringe, disposable, (EASY TOUCH LUER LOCK SYRINGE) 1 mL Syrg, Use for weekly hormone injection., Disp: 4 each, Rfl: 0    syringe, disposable, (EASY TOUCH LUER LOCK SYRINGE) 1 mL Syrg, Use for testosterone, Disp: 12 each, Rfl: 0    syringe, disposable, 2.5 mL Syrg, Use weekly, Disp: 30 Syringe, Rfl: 2    testosterone cypionate (DEPOTESTOTERONE CYPIONATE) 200 mg/mL injection, Inject 0.2 mL (40 mg total) into the muscle once a week., Disp: 5 mL, Rfl: 0    triamcinolone (KENALOG) 0.1 % cream, Apply topically Two (2) times a day., Disp: 15 g, Rfl: 0    Current Facility-Administered Medications:     omalizumab [name]) injection 300 mg, 300 mg, Subcutaneous, Q28 Days, [name], [name], [name], 300 mg at 05/24/22 1138  Assessment & Plan  Bacterial vaginosis  Significantly improved following 7-day course of vaginal clindamycin, but still having a small amount of off-white to yellowish discharge. Would strongly prefer not to re-test today, and after discussion of eval/management options, would like to extend clindamycin course to 14 days. Return precautions provided, pt verbalizes understanding and has no further questions or concerns at this time.   Orders:    clindamycin (CLEOCIN) 2 % vaginal cream; Insert 1 applicator into the vagina nightly for 7 days.    Otitis externa of right ear, unspecified chronicity, unspecified type  Pt endorses decreased hearing of both ears. Was recently evaluated by ENT and told he had a lot of wax in his ear, and recommended to have outpatient ear cleaning. Otoscopic exam today mostly limited by earwax. After discussion of eval/management options, pt elected to proceed  with ear lavage and instrumental extraction of earwax, which was painless on left side but very painful on right side. Following this, pt stated that his hearing improved significantly, however endorsed significant pain in R ear, particularly with traction on earlobe. Repeat otoscopic exam wnl on L, however on R had marked erythema and crusting of EAC, concerning for otitis externa. After discussion of risks and benefits, pt would like to proceed with ciprodex drops for tx of R otitis externa. Will plan to follow up in one week if not improved by that time. Return precautions provided, pt verbalizes understanding and has no further questions or concerns at this time.   Orders:    ciprofloxacin-dexAMETHasone (CIPRODEX) 0.3-0.1 % otic suspension; Administer 4 drops to the right ear two (2) times a day for 7 days.    Pain, dental  Pt with worsening posterior dental pain which he attributes to his wisdom teeth. No signs of active infection on oral exam. After discussion of eval/management options, pt would like to proceed with dental school clinic referral.  Orders:    Ambulatory referral to Dentistry; Future      HEALTH MAINTENANCE ITEMS STILL DUE:  There are no preventive care reminders to display for this patient.  Follow-up: No follow-ups on file.    Future Appointments   Date Time Provider Department Center   07/25/2023  8:30 AM Almodovar Sunday Shams, MD [facility]   07/26/2023  9:00 AM Galen Daft, FNP [facility]   07/26/2023  1:00 PM Seymour Bars, PT [facility]   07/27/2023  8:00 AM Lobonc, Ammie Ferrier, MD [facility]   08/01/2023  8:40 AM Linus Salmons, MD [facility]   08/15/2023  2:00 PM Rayala, Kandis Cocking, MD [facility]   08/17/2023 10:30 AM Addison Naegeli, PMHNP [facility]   08/25/2023  8:00 AM Linus Salmons, MD [facility]   08/30/2023 11:10 AM Mahoro, Champ Mungo, MD Rana[facility] P[facility]   09/18/2023 11:30 AM Caid, Letha Cape, MD [facility]   11/16/2023  9:00 AM Thora Lance, MD [facility]   01/04/2024  8:45 AM Kimple, Adam [place], MD [facility]       Subjective   Mr. Micciche is a 43 y.o. male  coming to clinic today for the following issues:    Chief Complaint   Patient presents with    Vaginal Discharge     Still discharge; much lighter     HPI: see above for more information.     I have reviewed the problem list, medications, and allergies and have updated/reconciled them if needed.    Mr. Absher  reports that he has never smoked. He has never been exposed to tobacco smoke. He has never used smokeless tobacco.  Health Maintenance   Topic Date Due    DTaP/Tdap/Td Vaccines (2 - Td or Tdap) 08/19/2027    Lipid Screening  12/26/2027    Pneumococcal Vaccine 0-49  Completed    Hepatitis C Screen  Completed    COVID-19 Vaccine  Completed    Influenza Vaccine  Completed       Objective     VITALS: BP 132/76 (BP Site: L Arm, BP Position: Sitting, BP Cuff Size: Medium)  - Pulse 67  - Temp 36.7 ??C (98.1 ??F) (Oral)  - Ht 162.6 cm (5' 4)  - Wt 70 kg (154 lb 6.4 oz)  - LMP 07/20/2017 (Exact Date)  -  BMI 26.50 kg/m??     Physical Exam  General: well-appearing, sitting upright in no acute distress  Head: Normocephalic, atraumatic  ENT: No dental trauma noted. Initial otoscopic exam obstructed by cerumen. Post-lavage exam wnl on L, however on R had marked erythema and crusting of EAC.  Eyes: conjunctiva normal, non-erythematous, non-icteric, no discharge.  Neck: no thyroid enlargement or masses  Lungs: No increased work of breathing or audible wheezing  Skin: Warm, dry, no erythema or rash on exposed skin  Musculoskeletal: No visible gait abnormalities  Neurologic: Alert & oriented x 3, no gross sensorimotor abnormalities  Psychiatric: Pleasant, cooperative, good eye contact, appropriate thought processes    Wt Readings from Last 3 Encounters:   07/19/23 70 kg (154 lb 6.4 oz)   06/27/23 70.9 kg (156 lb 3.2 oz)   06/22/23 70.8 kg (156 lb)      PHQ-9 PHQ-9 TOTAL SCORE   10/22/2020   9:00 AM 6   09/04/2020   9:00 AM 3   08/21/2020   9:00 AM 5   05/22/2020   3:00 PM 9     LABS/IMAGING  I have reviewed pertinent recent labs and imaging in Epic    Alver Fisher, MD, MPH (he/him)  [facility] at [facility]  [address]   [place], [place] [address]  Phone: [phone]  Fax: [phone]

## 2023-07-20 MED FILL — PANTOPRAZOLE 40 MG TABLET,DELAYED RELEASE: ORAL | 90 days supply | Qty: 90 | Fill #0

## 2023-07-23 NOTE — Unmapped (Signed)
Addended by: Marylyn Ishihara on: 07/23/2023 03:58 PM     Modules accepted: Orders

## 2023-07-24 NOTE — Unmapped (Signed)
Called  and left voicemail:  Appt date: 07/27/2023  Appt time: this RN advised to arrive 30 mins prior to appt.  Physician: Oda Kilts  Location: [phone]   Clinic phone number: [phone]  Driver? No    Advised to call clinic if experiencing COVID symptoms, signs of infection or recent use of antibiotics or steroids.

## 2023-07-25 ENCOUNTER — Ambulatory Visit: Admit: 2023-07-25 | Discharge: 2023-07-26 | Payer: BLUE CROSS/BLUE SHIELD

## 2023-07-25 DIAGNOSIS — G4453 Primary thunderclap headache: Principal | ICD-10-CM

## 2023-07-25 NOTE — Unmapped (Signed)
Department of Anesthesiology  [facility]  [address], [address]  [place], [place] [address]  [phone]    Chronic Pain Follow-Up Note  1. Myofascial pain    2. TOS (thoracic outlet syndrome)    3. Chronic pain syndrome      Assessment and Plan  Vincent Waters is a 43 y.o. being [facility] for evaluation of ongoing pain after TOS decompression/pectoralis minor release on the right for thoracic outlet syndrome by Dr. Gayla Doss in May 2023. He was first seen by our clinic in March 2023 for diagnostic injections. His range of motion has an increased postoperatively, but still has tight, spasming pain in the right neck and shoulder. He is currently weaning off benzodiazepines, discontinued opioid medications in late 2023 which he was chronically on for 3 years, and inquires about optimizing medications and procedures to improve his baseline pain at this time. His primary pain generator includes myofascial pain post-TOS decompression. He has other medical history that includes anxiety, scleroderma, migraines, autoimmune autonomic neuropathy, asthma. Also follows with sports medicine and has undergone injections there as well.  We have performed TPI and spinal accessory nerve block/pulsed RFA with benefit.     January 2025  Thoracic Outlet Syndrome - Myofascial pain - Chronic Pain Syndrome   Ongoing, worsened recently after fall. Pulsed RF repeat scheduled for tomorrow.  - Continue following with PT  - Right spinal accessory nerve pulsed RF/TPIs scheduled tomorrow  - Continue gabapentin 600 mg oral solution TID, refilled x2, one refill remaining   - Continue clonazepam taper with PCP    Considerations for the future:  - Repeat TPIs PRN  - Repeat spinal accessory nerve block/pulsed RFA  - Oromaxillofacial pain referral if trigeminal neuralgia suspected/ deemed separate from TOS pain   - Consider initiation of Butrans/buprenorphine product after weaning off of benzodiazepines and pain psych evaluation for COM should conservative measures fail, but hoping to avoid if possible  - Consider up titration of gabapentin oral solution or rotation to Lyrica     Return in about 3 months (around 10/23/2023).    No orders of the defined types were placed in this encounter.    Requested Prescriptions     Signed Prescriptions Disp Refills    gabapentin (NEURONTIN) 250 mg/5 mL oral solution 1080 mL 2     Sig: Take 12 mL (600 mg total) by mouth Three (3) times a day.     HPI:  Vincent Waters is seen in consultation at the request of Dr. Gayla Doss for evaluation and recommendations regarding His pain.     At last visit in July, the patient presented for post-procedure assessment. He underwent right spinal accessory nerve block with pulsed RFA on 12/29/22, which he endorsed 70% on going relief. He denied new or worsening symptoms. Currently took gabapentin 600 mg TID with benefit without any adverse side effects. Since then, the patient has followed with Rheumatology, PT, Allergy, GI, Pathology, and ENT. The patient underwent right spinal accessory nerve block with pulsed RFA on 04/10/23 with our clinic.     Today, the patient returns reporting overall unchanged pain related to his thoracic outlet syndrome and endorses excellent relief following his most recent pulsed RFA. He notes that he is scheduled to undergo a repeat procedure tomorrow, which he is looking forward to. He notes that he has had new pain related to a severe thunderclap headache that has been occurring for the past several months.  He states pain is severe and unlike his typical migraines, coming on without warning and occurring up to 7 times a day. He does find gabapentin beneficial for his TOS symptoms, though nothing has been very helpful for his new headache symptoms. He met with neurology yesterday and they are planning further workup. He has ongoing dizziness associated with known BPPV, though states that this is worsened since the headaches started. He did have a fall recently due to the dizziness, which exacerbated his pain symptoms for a time. He would like to continue on his medicine regimen without changes today.    Current Medications:  Gabapentin 600 mg TID- has been on for years.  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  Voltaren gel     Current view: Showing all answers           [facility] Return Patient Questionnaire       Question 07/26/2023  8:31 AM EST - Filed by Patient    What is the reason for your visit? Medication Refill     Discuss medication changes    Date of onset of your pain: 11/16/2021    Please rate your pain at its WORST in the past month. 8    Please rate your pain at its LEAST in the past month. 5    Please rate your pain as it is RIGHT NOW. 8    Please rate your pain on AVERAGE in the past month. 7    Please circle the location of your pain.       Please select the words that describe your pain. Aching     Pulsing     Sharp     Stabbing Tender Throbbing    How often do you have pain? Other    When is your pain the worst? During the day    Which of the following have been negatively affected by your pain? Normal work     Recreational activities     Sleep    Since your last visit:     Have you had any of the following? Primary Care Visit    Do you have any new pain you would like to discuss with your doctor? Yes    How has your pain changed? Stayed the same    Are you currently taking any blood-thinners or anticoagulants? No    If you are on Pain Medication - Are you having any of the following? I am stable on my current medication regime    If you have had a procedure since your last visit, how much pain relief was obtained? 70%    If you have had a procedure since your last visit, were there any complications?       General: Weight Loss/Gain     Night Sweats    Cardiovascular: Heart Palpitations    Gastrointestinal - (Intestinal): Diarrhea     Constipation Heartburn     Gastric Reflux    Skin: Rash     Skin changes     Hives    Endocrine (Hormonal System): Excess Thirst    Musculoskeletal System - (Muscles, Joints and Coverings): Joint Aches/Swelling     Spasms/Spasticity/Cramps    Neurologic: Dizziness/Vertigo    Psychiatric: Anxiety          [facility] Patient-Entered Hpi Selection Questionnaire       Question 07/26/2023  8:32 AM EST - Filed by Patient    What is the primary reason  for your visit? Other    Please describe your symptoms. Pain in shoulder wnd neck    Have you had these symptoms before? Yes    How long have you been having these symptoms? For more than a month    Please list any medications you are currently taking for this condition. Gabalentin    Please describe any probable cause for these symptoms.         Patient denies homicidal/suicidal ideation.    Previous Medication Trials:  NSAIDS- ibuprofen  Antidepressants-   Anticonvulsant- gabapentin  Muscle relaxants- flexeril, baclofen  Topicals-voltaren gel  Short-acting opiates-percocet   Long-acting opiates-   Anxiolytics-   Other-     Previous Interventions  Medications, PT, surgery, injections with sports med  TOS decompression/PMR 11/16/21  TPI-05/10/22-very helpful.  Have repeated several times since  PT-2023  Left GTB CSI-sports med 05/27/22  Spinal accessory nerve block-11/04/22  Spinal accessory nerve block and pulsed RFA-12/29/22    Allergies as of 07/26/2023 - Reviewed 07/26/2023   Allergen Reaction Noted    Latex Rash and Hives 09/16/2016    Penicillins Anaphylaxis 09/16/2016    Shellfish containing products Anaphylaxis 01/23/2017    Other Rash 12/16/2020    Trileptal [oxcarbazepine] Rash 07/19/2023      Current Outpatient Medications   Medication Sig Dispense Refill    azelastine (ASTELIN) 137 mcg (0.1 %) nasal spray Instill 2 sprays into each nostril two (2) times a day as needed for rhinitis. 30 mL 1    cetirizine (ZYRTEC) 10 MG tablet Take 1 tablet (10 mg total) by mouth daily. 30 tablet 12 ciprofloxacin-dexAMETHasone (CIPRODEX) 0.3-0.1 % otic suspension Administer 4 drops to the right ear two (2) times a day for 7 days. 7.5 mL 0    clonazePAM (KLONOPIN) 0.5 MG tablet Take 0.5 tablets (0.25 mg total) by mouth two (2) times a day as needed for anxiety. 30 tablet 0    dextroamphetamine-amphetamine (ADDERALL) 20 mg tablet Take 1 tablet (20 mg total) by mouth daily. 30 tablet 0    [START ON 09/17/2023] dextroamphetamine-amphetamine (ADDERALL) 20 mg tablet Take 1 tablet (20 mg total) by mouth daily. 30 tablet 0    diclofenac sodium (VOLTAREN) 1 % gel Apply 2 g topically four (4) times a day. 100 g 0    emtricitabine-tenofovir alafen (DESCOVY) 200-25 mg tablet Take 1 tablet by mouth daily. 90 tablet 0    inhalational spacing device Spcr Use as directed with albuterol and symbicort 1 each 1    ipratropium (ATROVENT) 42 mcg (0.06 %) nasal spray 1-2 sprays in each nostril as needed for drainage, up to 4 time daily. 15 mL 2    metoclopramide (REGLAN) 10 MG tablet Take 1/2 tablet (5 mg total) by mouth Three (3) times a day with a meal. 135 tablet 3    nebulizer and compressor (COMP-AIR NEBULIZER COMPRESSOR) Devi 1 each by Miscellaneous route nightly as needed. 1 each 0    needle, disp, 25 Pammie (BD REGULAR BEVEL NEEDLES) 25 Caro x 5/8 Ndle For subcutaneous hormone injection. 25 each 0    nifedipine 0.3% lidocaine 1.5% in petrolatum ointment Apply a pea-size amount to anal area 3 times a day 100 g 0    omalizumab (XOLAIR) 300 mg/2 mL auto-injector Inject the contents of 1 pen (300 mg) under the skin every fourteen (14) days. 4 mL 11    pantoprazole (PROTONIX) 40 MG tablet Take 1 tablet (40 mg total) by mouth daily. 90 tablet 3  propranolol (INDERAL) 10 MG tablet Take 1 tablet (10 mg total) by mouth two (2) times a day. 60 tablet 11    prucalopride (MOTEGRITY) 2 mg Tab Take 1 tablet (2 mg total) by mouth daily. 90 tablet 3    safety needles (BD ECLIPSE) 25 Ceola x 1 Ndle use for testosterone 12 each 0 safety needles (BD SAFETYGLIDE NEEDLE) 18 Rashaunda x 1 1/2 Ndle For drawing hormone injection 25 each 0    syringe, disposable, (EASY TOUCH LUER LOCK SYRINGE) 1 mL Syrg Use for weekly hormone injection. 4 each 0    syringe, disposable, (EASY TOUCH LUER LOCK SYRINGE) 1 mL Syrg Use for testosterone 12 each 0    syringe, disposable, 2.5 mL Syrg Use weekly 30 Syringe 2    testosterone cypionate (DEPOTESTOTERONE CYPIONATE) 200 mg/mL injection Inject 0.2 mL (40 mg total) into the muscle once a week. 5 mL 0    triamcinolone (KENALOG) 0.1 % cream Apply topically Two (2) times a day. 15 g 0    albuterol 2.5 mg /3 mL (0.083 %) nebulizer solution Inhale 3 mL (2.5 mg total) by nebulization every six (6) hours as needed for wheezing or shortness of breath. 90 mL 1    budesonide-formoterol (SYMBICORT) 160-4.5 mcg/actuation inhaler Inhale 1 puff by mouth Two (2) times a day. 10.2 g 5    EPINEPHrine (EPIPEN) 0.3 mg/0.3 mL injection Inject 0.3 mL (0.3 mg total) under the skin once for 1 dose. (Patient not taking: Reported on 07/06/2023) 2 each 0    gabapentin (NEURONTIN) 250 mg/5 mL oral solution Take 12 mL (600 mg total) by mouth Three (3) times a day. 1080 mL 2     Current Facility-Administered Medications   Medication Dose Route Frequency Provider Last Rate Last Admin    omalizumab [name]) injection 300 mg  300 mg Subcutaneous Q28 Days [name], [name], [name]   300 mg at 05/24/22 1138       Imaging/Tests:   Lab Results   Component Value Date    PLT 316 01/19/2023     Lab Results   Component Value Date    CREATININE 0.91 01/19/2023     No results found for: PT, INR  No results found for: APTT  Lab Results   Component Value Date    A1C 4.9 08/03/2017     Lab Results   Component Value Date    ALKPHOS 110 01/19/2023    BILITOT 0.5 01/19/2023    BILIDIR 0.20 01/19/2023    PROT 7.9 01/19/2023    ALBUMIN 4.3 01/19/2023    ALT 10 01/19/2023    AST 18 01/19/2023       PHYSICAL EXAM:  BP 112/77  - Pulse 67  - Resp 16  - Ht 162.6 cm (5' 4)  - Wt 71.2 kg (157 lb)  - LMP 07/20/2017 (Exact Date)  - SpO2 98%  - BMI 26.95 kg/m??     Wt Readings from Last 3 Encounters:   07/26/23 71.2 kg (157 lb)   07/25/23 71.2 kg (157 lb)   07/19/23 70 kg (154 lb 6.4 oz)     GENERAL:  The patient is well developed, well-nourished, and appears to be in no apparent distress.   HEAD/NECK:    Normocephalic/atraumatic. clear sclera, pupils not pinpoint  CV:  Warm and well-perfused.  LUNGS:   Normal work of breathing, no supplemental O2  EXTREMITIES:  No clubbing, cyanosis noted.  NEUROLOGIC:    The patient is alert and oriented, speech  fluent, normal language.   MUSCULOSKELETAL:    Motor function  preserved.    GAIT:  The patient rises from a seated position with no difficulty and ambulates with nonantalgic gait without the assistance of a walking aid.   SKIN:   No obvious rashes, lesions, or erythema.  PSY:   Appropriate affect. No overt pain behaviors. No evidence of psychomotor retardation or agitation, no signs of intoxication.       We are delivering comprehensive, continuous, longitudinal care for this patient with chronic pain.

## 2023-07-25 NOTE — Unmapped (Signed)
Diagnosis ICD-10-CM Associated Orders   1. Thunderclap headache  G44.53 Ambulatory referral to Neurology     MRA Head W Wo Contrast     MRI brain with and without contrast        Ddx. Includes:  Primary thunderclap headache  Aneurysm  Cerebral vasoconstriction syndrome      PLAN:  MRI head for structural lesions  MRA head or aneurysm/vasoconstriction  Increase gabapentin to 600 600 600 prescribed dose  We can try verapamil at next visit if possible  We should no try indomethacin due to gastroparesis and intolerance to ibuprofen.

## 2023-07-25 NOTE — Unmapped (Signed)
[facility] Neurology Clinic Summary       [facility] [address]  [facility]OLOG[facility] [address] [place]  [address]  [place] [address]  [phone]    Date: 07/25/2023  Patient Name: Vincent Waters  MRN: [id number]  PCP: Vincent Waters*            [name] is a 43 y.o. right handed male seen in consultation at the [facility] at the request of Dr. Ralene Waters for evaluation.          Subjective:   Subjective        HPI: Patient is a 43 y.o. male seen at the request of Vincent Waters*.    PREFERRED PRONOUNS ARE HE/HIM/HIS.       Headaches   - has had them for many years  - all over his head  - manageable     Headaches have changed to a thunderclap type of sensation in which he feels like he is sucker punched or stabbed on the right side of the headache. These symptoms go away within a few seconds. It can happen 6-7 times a day. This has been going on for about the lat 6 months.     Currently gabapentin [has stopped propranolol], ibuprofen does not help and does not sit well with stomach due to gastroparesis. Uses gabapentin for nerve pain in the setting of thoracic outlet syndrome [600 mg three times a day, he only takes the nighttime dose, he tolerates it very well].     He has noted that there is some vertigo happening intermittently. His mother does suffer from this.     No history of aneuryms or similar issues in family.             Past Medical Hx, Family Hx, Social Hx, and Problem List has been reviewed in the [employer].        REVIEW OF SYSTEMS:  ROS        Objective:       Physical Exam:  Blood pressure 121/74, pulse 73, temperature 36.7 ??C (98.1 ??F), temperature source Temporal, height 162.6 cm (5' 4), weight 71.2 kg (157 lb), last menstrual period 07/20/2017, not currently breastfeeding.       Physical Exam          Neurological Exam    Awake, alert, oriented x 3  PERRL, EOM intact, visual fields intact  No tongue or palate deviation, no facial asymmetry  No facial sensory deficit.   Trapezius 5/5     Deltoids 5/5  Biceps 5/5  Triceps 5/5  Wrist extension 5/5  Wrist flexion 5/5  Finger flexors 5/5  Fingers extensors 5/5  Hip flexion 5/5  Knee flexion 5/5  Knee extension 5/5  Foot dorsiflexon 5/5  Foot eversion, inversion 5/5  Foot plantarflexion 5/5    DTR  Brachioradialis 2/2  Triceps 2/2  Biceps 2/2  Knee 2/2  Ankles 2/2    Normal vibration and pinprick.     Babinski absent  No crossed adductors    Normal finger to nose    Normal romberg    Normal gait               Assessment & Plan:   Assessment     Vincent Waters is a 43 y.o. right handed male seen in consultation for the following problems.  Diagnosis ICD-10-CM Associated Orders   1. Thunderclap headache  G44.53 Ambulatory referral to Neurology     MRA Head W Wo Contrast     MRI brain with and without contrast        Ddx. Includes:  Primary thunderclap headache  Aneurysm  Cerebral vasoconstriction syndrome      PLAN:  MRI head for structural lesions  MRA head or aneurysm/vasoconstriction  Increase gabapentin to 600 600 600 prescribed dose  We can try verapamil at next visit if possible  We should no try indomethacin due to gastroparesis and intolerance to ibuprofen.       Thank you very much for this consultation. Please let us know if any questions or concerns.     Sincerely,  Vincent Galas, MD     CC: Vincent Waters*

## 2023-07-26 ENCOUNTER — Ambulatory Visit: Admit: 2023-07-26 | Discharge: 2023-07-27 | Payer: BLUE CROSS/BLUE SHIELD

## 2023-07-26 DIAGNOSIS — G894 Chronic pain syndrome: Principal | ICD-10-CM

## 2023-07-26 DIAGNOSIS — G540 Brachial plexus disorders: Principal | ICD-10-CM

## 2023-07-26 DIAGNOSIS — M7918 Myalgia, other site: Principal | ICD-10-CM

## 2023-07-26 MED ORDER — GABAPENTIN 250 MG/5 ML ORAL SOLUTION
Freq: Three times a day (TID) | ORAL | 2 refills | 30.00 days | Status: CP
Start: 2023-07-26 — End: ?
  Filled 2023-08-04: 30d supply, fill #0

## 2023-07-26 NOTE — Unmapped (Signed)
[facility] THERAPY SERVICES AT [place]  OUTPATIENT PHYSICAL THERAPY  07/26/2023  Note Type: Treatment Note       Patient Name: Vincent Waters  Date of Birth:06-01-1981  Diagnosis:   Encounter Diagnoses   Name Primary?    Neck pain on right side Yes    Chronic right shoulder pain      Referring MD:  Dawna Part, *     Date of Onset of Impairment-09/18/2016  Date PT Care Plan Established or Reviewed-11/07/2022  Date PT Treatment Started-05/06/2022   Visit Count: 22  Plan of Care Effective Date:          Assessment/Plan:    Assessment  Assessment details:    Vincent Waters continues to have tightness and over activation of his right cervical/shoulder musculature.  He continues to get relief from manual therapy.  We agreed to continue to balance his shoulder strength and revamp his aberrant movement patterns.  I wonder how much of his headache is postural/cervicogenic in nature.  We will try to tease this out as we move forward.  All questions answered.  This patient requires skilled physical therapy services to address the outlined impairments in order to return to his desired level of function.           Impairments: decreased strength, hypertonicity, pain and postural weakness                      Therapy Goals      Goals:      Short Term Goals: Updated 11/07/2022.   In 6 weeks:  Goal #1: Patient will be independent with progressively monitored home exercise program.   Baseline/Current: Patient is familiar with home exercise program and performs it consistently.     Goal #2: Patient will demonstrate cervical ROM within normal limits with minimal symptoms in order to allow for functional activities such as safe driving and looking before crossing the street.   Baseline/Current: COMPLETE.  Patient now has normal cervical ROM (see ROM testing).     Goal #3: Patient will demonstrate right shoulder range of motion within normal limits with minimal symptoms for proper glenohumeral joint mechanics in order to allow for daily functional tasks.   Baseline/Current: COMPLETE.  Patient has normal right shoulder range of motion (see ROM testing).     Goal #4: Patient will demonstrate improved posture without cuing.  Baseline/Current: PARTIALLY COMPLETE.  Patient has intermittently improved posture with right shoulder still sitting forward and ups.    Long Term Goals: Updated 11/07/2022.   In 12 weeks:  Goal #1: Patient will demonstrate right shoulder strength within normal limits with minimal symptoms in order to allow for pain free lifting and reaching with right upper extremity.   Baseline/Current: NEARLY COMPLETE.  Patient has significantly improved right shoulder strength with residual weakness (see manual muscle testing).     Goal #2: Patient will be able to reach over head with minimal symptoms in order to place/remove an object over shoulder height in a cabinet.  Baseline/Current: COMPLETE.  Patient can now reach over head without issue.    Goal #3: Patient will return to recreational activities (working out in gym) without limit.  Baseline/Current: PARTIALLY COMPLETE.  Patient has returned to limited weight lifting.    Goal #4: Patient's FOTO score will improve from 34 at initial evaluation to >/= 52 at discharge in order to demonstrate a significant improvement in self-reported outcome measures that directly result from his course of  physical therapy.   Baseline/Current: IN PROGRESS.  FOTO = 34    Plan    Therapy options: will be seen for skilled physical therapy services    Planned therapy interventions: Body Mechanics Training, Dry Needling, Education - Patient, Home Exercise Program, Manual Therapy, Neuromuscular Re-education, Postural Training, Therapeutic Activities and Therapeutic Exercises      Frequency: 1x week    Duration in weeks: 6    Education provided to: patient.    Education provided: HEP, Estate manager/land agent, Treatment options and plan, Symptom management and Body awareness    Education results: verbalized good understanding, demonstrates understanding and needs further instruction.    Communication/Consultation: N/A.    Next visit plan:        Posterior shoulder/depressor strengthening. Manual Therapy as needed.    Total Session Time: 30    Treatment rendered today:      Manual Therapy: 30 minutes  - Soft Tissue Mobilization Right Pectoralis Minor  - Pin and Stretch Right Pec  - GHJ AP in Various Amounts of Abduction  - GHJ in PROM IR  - Side Lying Pec/Quick Stretches  - Right proximal 1st rib mobilizations with scalene pin and stretch   [facility]  was informed of the risks associated with dry needling and he signed consent form.  Patient screened for any contraindications to needling including pregnancy, infection, antibiotic use, anticoagulants use, autoimmune issues, uncontrolled diabetes, and active cancer/treatment and does not have any of these conditions.  DRY NEEDLING FOR RELEASING IDENTIFIED TRIGGER POINTS TO FACILITATE MANUAL THERAPY TECHNIQUES AND ALLOW MUSCLE TO RETURN TO OPTIMAL LENGTH (NO CHARGE).  Right upper Trapezius trigger point with 50mm x .30mm with Pinch/Pull Technique established for safety - Local Twitch Response produced  Right SCM trigger point with 30mm x .30mm with Pinch/Pull Technique established for safety - Local Twitch Response produced     Right Scalenes trigger point with 30mm x .30mm with Flat Palpation/Direct Approach established for safety - Local Twitch Response produced   Right Pec Minor trigger point with 50mm x .30mm with Pinch/Pull Technique established for safety - Local Twitch Response produced    Plan details: Access Code: [email or web address]  URL: [email or web address]  Date: 05/11/2023  Prepared by: Christianne Dolin    Exercises  - Standing Shoulder Horizontal Abduction with Resistance  - 1 x daily - 7 x weekly - 2 sets - 10 reps - 1-2 hold  - Shoulder External Rotation and Scapular Retraction with Resistance  - 1 x daily - 7 x weekly - 2 sets - 10 reps  - Standing Shoulder flexion Wall Slide (Mirrored)  - 1 x daily - 7 x weekly - 1 sets - 15 reps  - Seated Shoulder Scaption AAROM with Pulley at Side (Mirrored)  - 1 x daily - 7 x weekly - 3 sets - 10 reps  - Seated Upper Trapezius Stretch  - 1 x daily - 7 x weekly - 1 sets - 3 reps - 30 hold  - Standing Shoulder Internal Rotation Stretch with Hands Behind Back (Mirrored)  - 1 x daily - 7 x weekly - 1 sets - 15 reps - 5 hold  - Sleeper Stretch (Mirrored)  - 1 x daily - 7 x weekly - 1 sets - 10 reps - 10 hold  - Sidelying Shoulder Horizontal Abduction  - 1 x daily - 7 x weekly - 2 sets - 10 reps  - Chair Press Ups Forward and Backward  - 1 x daily - 7  x weekly - 1 sets - 10 reps - 5 hold  - Supine Pectoralis Stretch  - 1 x daily - 7 x weekly - 1 sets - 10 reps - 10 hold  - Prone Single Arm Shoulder Horizontal Abduction with Dumbbell - Palm Down  - 1 x daily - 7 x weekly - 2 sets - 10 reps  - Low Trap Setting at Wall  - 1 x daily - 7 x weekly - 2 sets - 10 reps - 5 hold  - Shoulder extension with resistance - Neutral  - 1 x daily - 7 x weekly - 2 sets - 10 reps  - CLX Tricep Pressdowns  - 1 x daily - 7 x weekly - 2 sets - 10 reps      Subjective:     History of Present Condition     Date of surgery:  11/16/2021    Date of onset:  09/18/2016    Date is approximation?: yes    Explanation:  Patient reports onset of symptoms in early 2018.       Surgical Procedure:  Other (Right anterior scalenectomy, middle scalenectomy, first rib resection and right Pectoralis minor release)    History of Present Condition/Chief Complaint:  Vincent Waters is a 43 y.o. right hand dominant adult who presents to outpatient physical therapy with right neck and shoulder pain that began in early 2018 when he was changing the oil in his car when it fell onto his sternum.  He went to the ER and got an x-ray which showed a sternal fracture.  Since then, he has had persistent issues and nerve pressure in his right neck/shoulder which led him to get a right anterior scalenectomy, middle scalenectomy, first rib resection and right Pectoralis minor release surgery performed on 11/16/2021 by Thora Lance, MD.  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  Subjective:  Vincent Waters reports that the ball is still there in his shoulder.  He has been working out in [employer]  His NP recommended that he take his gabapentin 3x/day to see if it will help to mitigate the muscle tightness.  He has been doing his exercises consistently.  Pain:     Current pain rating:  3  Location:  Right neck/shoulder    Quality:  Tight and aching    Progression:  Improved      Objective:       Palpation     Right Shoulder   Hypertonic in the pectoralis minor and upper trapezius.   Tenderness of the pectoralis minor and upper trapezius.     Right Cervical/Thoracic   Hypertonic in the pectoralis minor, scalenes, sternocleidomastoid and upper trapezius.   Tenderness of the pectoralis minor and upper trapezius.     Joint Mobility  Shoulder  Right Shoulder Joint Mobility    Posterior capsule: hypomobile                                  I attest that I have reviewed the above information.  Signed: Patton Salles, PT  07/26/2023 1:10 PM

## 2023-07-26 NOTE — Unmapped (Addendum)
PROCEDURE; Right spinal accessory nerve block with pulsed RFA under ultrasound guidance  PRE-PROCEDURE DIAGNOSIS: Spinal accessory neuralgia  POST-PROCEDURE DIAGNOSIS: Same   PERFORMED BY: Dr. Oda Kilts  ASSISTANT: Dr Allena Katz  Anesthesia: Local without IV sedation      BRIEF HISTORY:   Vincent Waters [facility] ongoing pain after decompression/pectoralis minor release on the right for thoracic outlet syndrome by Dr. Gayla Doss in May 2023.  He was first seen by our clinic in March 2023 for diagnostic injections.  His range of motion has an increased postoperatively, but still has tight, spasming pain in the right neck and shoulder.  His primary pain generator includes myofascial pain post-TOS decompression. Patient presents with worsened pain localized to right neck and shoulder area. He denies radiation of pain down his RUE. TPI injections have been helpful but the bulk of the pain is localized to the trapezius. He previously underwent a right-sided spinal accessory nerve block with steroid, which provided 100% relief immediately, but only helped for a few days.      In 12/2022 & 03/2023- patient had pulsed RF therapy with good sustained for ~3 month or so and now pain and symptoms returning. He returns today for a repeat procedure.      DESCRIPTION OF THE PROCEDURE:   Informed consent was obtained and potential risks discussed including, but not limited to: bleeding, bruising, severe allergic reaction to components of the injection materials, infection, nerve damage, the possibility of no benefit (pain relief) derived from the injection, or in rare occasions worsening of pain. Questions were answered to the patient's satisfaction and the patient wishes to proceed. Alternative options for treatment have previously been discussed and explored with the patient. The patient is not taking antiplatelet or anticoagulation medications and does not have a driver today.    The patient was taken to the procedure room and placed supine, bumped slightly under the right shoulder. The procedure was performed under real-time ultrasound guidance. The patient's heart rate, blood pressure, and oxygen saturation were continuously monitored throughout the procedure. A timeout was performed and the correct side confirmed with the patient. Initial scout images were performed with the ultrasound device. The area of interest was sterilely prepped with Chloraprep and sterilely draped. The physician wore a hat, mask and sterile gloves.     On the right, using a 25 Pattye needle, the skin and subcutaneous tissues were anesthetized with 0.5% lidocaine. Then, using ultrasound guidance, a 20 Monea 10cm cannula with a 10mm curved active tip was advanced via an in-plane technique (from the posterior aspect of the U/S probe) until the tip was in the superficial fascial plane adjacent to the area of the spinal accessory nerve just posterior to the SCM.  With good impedances, sensory stimulation at 50Hz  was obtained below 1 V. Stimulation of this area reproduced the patients pain. Motor stimulation caused right trapezius activation.  Pulsed RF was performed at 42 degrees celsius for 120 seconds.  After negative aspiration for heme, 3 mL of 2% lidocaine was injected incrementally after negative aspiration through each cannula under ultrasound guidance.   The cannula was then removed, the patient was cleaned, and a bandage applied.     The procedure was not repeated on the contralateral side in the exact same fashion.    The patient did tolerate the procedure well and there were no apparent complications. All injection sites were sterilely dressed. The patient was discharged after an appropriate  period of observation and post procedural education was given.     Pre-procedure pain score was 8/10  Post-procedure pain score was 3/10  Complications: none    DISPO:  Repeat same procedure PRN.

## 2023-07-27 ENCOUNTER — Ambulatory Visit
Admit: 2023-07-27 | Discharge: 2023-07-28 | Payer: BLUE CROSS/BLUE SHIELD | Attending: Anesthesiology | Primary: Anesthesiology

## 2023-07-27 DIAGNOSIS — G894 Chronic pain syndrome: Principal | ICD-10-CM

## 2023-07-27 DIAGNOSIS — M7918 Myalgia, other site: Principal | ICD-10-CM

## 2023-07-27 DIAGNOSIS — M542 Cervicalgia: Principal | ICD-10-CM

## 2023-07-27 MED ADMIN — lidocaine (XYLOCAINE) 5 mg/mL (0.5 %) injection 10 mL: 10 mL | @ 14:00:00 | Stop: 2023-07-27

## 2023-07-27 MED ADMIN — lidocaine (XYLOCAINE) 20 mg/mL (2 %) injection 10 mL: 10 mL | @ 14:00:00 | Stop: 2023-07-27

## 2023-07-27 NOTE — Unmapped (Signed)
 POST PROCEDURE INSTRUCTIONS   You may apply an ice pack 20-30 minutes at a time to the injection site if you experience soreness.     Keep the injection site clean and dry. You make remove the band-aid one day following the procedure.     You may take a shower but AVOID getting in to baths, pools or whirlpools for 48 HOURS AFTER THE PROCEDURE.       ACTIVITY   Refrain from heavy activity for the next 24 to 48 hours. General walking is okay. You may resume your normal activities the day following the procedure.   You may start or resume your individualized exercise program or physical therapy 48 hours after the procedure.  IF YOU'VE HAD TRIGGER POINT INJECTIONS, YOU MAY CONTINUE EXERCISE OR PHYSICAL THERAPY WITHOUT DELAY.  MEDICATIONS   Please note that it is okay to continue other prescribed medications (blood pressure, insulin, water pill, depression/anxiety pill, etc.) as well as other prescribed pain medications such as Neurontin, Lyrical, Celebrex, Ultram, Vicodin, Norco and acetaminophen (Tylenol).   SIDE EFFECTS   Increase in pain during the first 24 to 48 hours.     You might experience:   1. Mild to moderate swelling at the joint.   2. Possible bruising at the injection site.     WHEN TO CALL THE DOCTOR/NURSE   Severe pain, worse or different that the pain you had before the procedure.     Fever or chills.     Redness, or swelling around the injection site.     Call the Pain Management  Procedural nurses [phone],  during normal business hours (7 am-3 pm). If it is AFTER HOURS or during a weekend or holiday, call the hospital operator and ask for the Anesthesia Pain physician on call at [phone].   FOR EMERGENCIES, CALL 911 OR GO TO THE NEAREST HOSPITAL EMERGENCY DEPARTMENT.   ?   TO SCHEDULE APPOINTMENTS OR FOR QUESTIONS RELATED TO MEDICATIONS   Call the [facility] at [phone]

## 2023-07-27 NOTE — Unmapped (Addendum)
Trigger Point Injection of 3+ muscle groups: Right cervical paraspinal, trapezius, levator scapulae, rhomboids.   Pre-operative diagnosis: myofascial pain  Post-operative diagnosis: Same    Attending Physician: Dr. Criss Rosales  Assistant: Dr. Allena Katz    After risks and benefits were explained including bleeding, infection, worsening of the pain, damage to the area being injected, weakness, allergic reaction to medications, vascular injection, pneumothorax and nerve damage, signed consent was obtained.  All questions were answered.   The patient is not taking antiplatelet or anticoagulation medications and does not have a driver today.    The area of the trigger point was identified and the skin prepped with chlorhexidine.  Next, a 27 Moe 0.5 inch needle was placed in the area of the trigger point.  Dry needling was performed in the area of the trigger point.  Once reproduction of the pain was elicited and negative aspiration confirmed, the trigger point was injected with 0.5-1 ml of 0.25 % of bupivacaine and the needle removed.      The patient did tolerate the procedure well and there were not complications.      Trigger points injected: 14    Trigger point locations:  right Right cervical paraspinal, trapezius, levator scapulae, rhomboids.     The patient was monitored for 15 minutes after the procedure.  Vital signs remained normal and the patient ambulated out of clinic    Pre-procedure pain score: 8/10  Post-procedure pain score: 3/10    DISPO:  Repeat TPI PRN.

## 2023-07-27 NOTE — Unmapped (Signed)
 Printed AVS and reviewed with patient. Patient voiced understanding.

## 2023-08-01 ENCOUNTER — Ambulatory Visit
Admit: 2023-08-01 | Discharge: 2023-08-02 | Payer: BLUE CROSS/BLUE SHIELD | Attending: Student in an Organized Health Care Education/Training Program | Primary: Student in an Organized Health Care Education/Training Program

## 2023-08-01 DIAGNOSIS — G4453 Primary thunderclap headache: Principal | ICD-10-CM

## 2023-08-01 DIAGNOSIS — M79604 Pain in right leg: Principal | ICD-10-CM

## 2023-08-01 DIAGNOSIS — M545 Low back pain radiating to right lower extremity: Principal | ICD-10-CM

## 2023-08-01 NOTE — Unmapped (Signed)
[facility] at [facility]  Established Patient Clinic Note    Assessment/Plan:   Vincent Waters is a 43 y.o.male who presents for follow-up.    Problem List Items Addressed This Visit    None  Visit Diagnoses         Low back pain radiating to right lower extremity    -  Primary      Thunderclap headache              - Current meds:   Current Outpatient Medications:     albuterol 2.5 mg /3 mL (0.083 %) nebulizer solution, Inhale 3 mL (2.5 mg total) by nebulization every six (6) hours as needed for wheezing or shortness of breath., Disp: 90 mL, Rfl: 1    azelastine (ASTELIN) 137 mcg (0.1 %) nasal spray, Instill 2 sprays into each nostril two (2) times a day as needed for rhinitis., Disp: 30 mL, Rfl: 1    budesonide-formoterol (SYMBICORT) 160-4.5 mcg/actuation inhaler, Inhale 1 puff by mouth Two (2) times a day., Disp: 10.2 g, Rfl: 5    cetirizine (ZYRTEC) 10 MG tablet, Take 1 tablet (10 mg total) by mouth daily., Disp: 30 tablet, Rfl: 12    ciprofloxacin-dexAMETHasone (CIPRODEX) 0.3-0.1 % otic suspension, Administer 4 drops to the right ear two (2) times a day for 7 days., Disp: 7.5 mL, Rfl: 0    clonazePAM (KLONOPIN) 0.5 MG tablet, Take 0.5 tablets (0.25 mg total) by mouth two (2) times a day as needed for anxiety., Disp: 30 tablet, Rfl: 0    dextroamphetamine-amphetamine (ADDERALL) 20 mg tablet, Take 1 tablet (20 mg total) by mouth daily., Disp: 30 tablet, Rfl: 0    [START ON 09/17/2023] dextroamphetamine-amphetamine (ADDERALL) 20 mg tablet, Take 1 tablet (20 mg total) by mouth daily., Disp: 30 tablet, Rfl: 0    diclofenac sodium (VOLTAREN) 1 % gel, Apply 2 g topically four (4) times a day., Disp: 100 g, Rfl: 0    emtricitabine-tenofovir alafen (DESCOVY) 200-25 mg tablet, Take 1 tablet by mouth daily., Disp: 90 tablet, Rfl: 0    EPINEPHrine (EPIPEN) 0.3 mg/0.3 mL injection, Inject 0.3 mL (0.3 mg total) under the skin once for 1 dose., Disp: 2 each, Rfl: 0    gabapentin (NEURONTIN) 250 mg/5 mL oral solution, Take 12 mL (600 mg total) by mouth Three (3) times a day., Disp: 1080 mL, Rfl: 2    inhalational spacing device Spcr, Use as directed with albuterol and symbicort, Disp: 1 each, Rfl: 1    ipratropium (ATROVENT) 42 mcg (0.06 %) nasal spray, 1-2 sprays in each nostril as needed for drainage, up to 4 time daily., Disp: 15 mL, Rfl: 2    metoclopramide (REGLAN) 10 MG tablet, Take 1/2 tablet (5 mg total) by mouth Three (3) times a day with a meal., Disp: 135 tablet, Rfl: 3    nebulizer and compressor (COMP-AIR NEBULIZER COMPRESSOR) Devi, 1 each by Miscellaneous route nightly as needed., Disp: 1 each, Rfl: 0    needle, disp, 25 [place] (BD REGULAR BEVEL NEEDLES) 25 Huntley x 5/8 Ndle, For subcutaneous hormone injection., Disp: 25 each, Rfl: 0    nifedipine 0.3% lidocaine 1.5% in petrolatum ointment, Apply a pea-size amount to anal area 3 times a day, Disp: 100 g, Rfl: 0    omalizumab (XOLAIR) 300 mg/2 mL auto-injector, Inject the contents of 1 pen (300 mg) under the skin every fourteen (14) days., Disp: 4 mL, Rfl: 11    pantoprazole (PROTONIX) 40 MG tablet,  Take 1 tablet (40 mg total) by mouth daily., Disp: 90 tablet, Rfl: 3    propranolol (INDERAL) 10 MG tablet, Take 1 tablet (10 mg total) by mouth two (2) times a day., Disp: 60 tablet, Rfl: 11    prucalopride (MOTEGRITY) 2 mg Tab, Take 1 tablet (2 mg total) by mouth daily., Disp: 90 tablet, Rfl: 3    safety needles (BD ECLIPSE) 25 Brekyn x 1 Ndle, use for testosterone, Disp: 12 each, Rfl: 0    safety needles (BD SAFETYGLIDE NEEDLE) 18 Araly x 1 1/2 Ndle, For drawing hormone injection, Disp: 25 each, Rfl: 0    syringe, disposable, (EASY TOUCH LUER LOCK SYRINGE) 1 mL Syrg, Use for weekly hormone injection., Disp: 4 each, Rfl: 0    syringe, disposable, (EASY TOUCH LUER LOCK SYRINGE) 1 mL Syrg, Use for testosterone, Disp: 12 each, Rfl: 0    syringe, disposable, 2.5 mL Syrg, Use weekly, Disp: 30 Syringe, Rfl: 2    testosterone cypionate (DEPOTESTOTERONE CYPIONATE) 200 mg/mL injection, Inject 0.2 mL (40 mg total) into the muscle once a week., Disp: 5 mL, Rfl: 0    triamcinolone (KENALOG) 0.1 % cream, Apply topically Two (2) times a day., Disp: 15 g, Rfl: 0    Current Facility-Administered Medications:     omalizumab [name]) injection 300 mg, 300 mg, Subcutaneous, Q28 Days, [name], [name], [name], 300 mg at 05/24/22 1138  Assessment & Plan  Low back pain radiating to right lower extremity  Left lower back pain and mass that comes and goes for the past few months. Denies radiation down legs. Denies unintended weight loss, pain in the middle of the night, fever, chills, or other red flag symptoms. Exam notable for ~2-3 cm area of firmness and TTP just lateral to lumbar spine. No direct spinal TTP. Discussed differential including most likely muscle spasm, also lipoma, less likely abscess or cyst. Sees PT for shoulder pain and gets dry needling there and would like to try this for lower back as well. If refractory can obtain US.   - Pt to speak with PT re: dry needling  - If refractory, consider US soft tissue       Thunderclap headache  Saw Neurology on 2/4, MRI scheduled for 3/8. Headache pattern has been stable but pt understandably would like to have MRI done prior to March given urgency of evaluation. Our team will call imaging to request earlier appointment, and I have re-placed order as STAT. Discussed that if we are unable to get sooner imaging visit, safest thing to do in case of recurrent headache is present to ER. Strict ER/return precautions provided, pt verbalizes understanding and has no further questions or concerns at this time.   - Attempt to arrange sooner MRI  - Strict ER/return precautions provided  Orders:    MRA Head W Wo Contrast; Future    MRI brain with and without contrast; Future      HEALTH MAINTENANCE ITEMS STILL DUE:  There are no preventive care reminders to display for this patient.  Follow-up: No follow-ups on file.    Future Appointments   Date Time Provider Department Center   08/03/2023  1:00 PM Seymour Bars, PT [facility]   08/10/2023  1:00 PM Seymour Bars, PT [facility]   08/15/2023  2:00 PM Rayala, Kandis Cocking, MD [facility]   08/16/2023  1:45 PM Seymour Bars, PT [facility]   08/17/2023 10:30 AM Coffin, Deeann Cree, PMHNP  [facility]   08/22/2023 12:15 PM Myer, Sharyn Creamer, PT [facility]   08/25/2023  8:00 AM Linus Salmons, MD UNCFMFAYS [facility]   08/26/2023  8:45 AM IC MRI MBL 1 IMRIRL[facility] [place] - [facility]   08/30/2023 11:10 AM Mahoro, Champ Mungo, MD UNCGIMEDET [facility]   09/18/2023 11:30 AM Caid, Letha Cape, MD UNCALLERGET [facility]   10/17/2023  9:00 AM Galen Daft, FNP ANESPAINMRKT [facility]   10/24/2023 10:00 AM Lady Gary, FNP [facility]   11/16/2023  9:00 AM Thora Lance, MD HVVSURMMNT [facility]   01/04/2024  8:45 AM Kimple, Adam [place]az[place], MD UNCOTOMEWVIL [facility]       Subjective   Vincent Waters is a 43 y.o. male  coming to clinic today for the following issues:    Chief Complaint   Patient presents with    Follow-up     HPI: see above for more information.     I have reviewed the problem list, medications, and allergies and have updated/reconciled them if needed.    Vincent Waters  reports that he has never smoked. He has never been exposed to tobacco smoke. He has never used smokeless tobacco.  Health Maintenance   Topic Date Due    DTaP/Tdap/Td Vaccines (2 - Td or Tdap) 08/19/2027    Lipid Screening  12/26/2027    Pneumococcal Vaccine 0-49  Completed    Hepatitis C Screen  Completed    COVID-19 Vaccine  Completed    Influenza Vaccine  Completed       Objective     VITALS: BP 120/79 (BP Site: L Arm, BP Position: Sitting, BP Cuff Size: Medium)  - Pulse 69  - Temp 37 ??C (98.6 ??F) (Temporal)  - Ht 162.6 cm (5' 4.02)  - Wt 71.2 kg (157 lb)  - LMP 07/20/2017 (Exact Date)  - BMI 26.94 kg/m??     Physical Exam  General: well-appearing, sitting upright in no acute distress  Head: Normocephalic, atraumatic  ENT: No dental trauma noted.   Eyes: conjunctiva normal, non-erythematous, non-icteric, no discharge.  Neck: no thyroid enlargement or masses  Back: ~2-3 cm area of firmness and TTP just lateral to lumbar spine. No direct spinal TTP.   Lungs: No increased work of breathing or audible wheezing  Skin: Warm, dry, no erythema or rash on exposed skin  Musculoskeletal: No visible gait abnormalities  Neurologic: Alert & oriented x 3, no gross sensorimotor abnormalities  Psychiatric: Pleasant, cooperative, good eye contact, appropriate thought processes    Wt Readings from Last 3 Encounters:   08/01/23 71.2 kg (157 lb)   07/26/23 71.2 kg (157 lb)   07/25/23 71.2 kg (157 lb)      PHQ-9 PHQ-9 TOTAL SCORE   10/22/2020   9:00 AM 6   09/04/2020   9:00 AM 3   08/21/2020   9:00 AM 5   05/22/2020   3:00 PM 9     LABS/IMAGING  I have reviewed pertinent recent labs and imaging in Epic    Alver Fisher, MD, MPH (he/him)  [facility] at [facility]  [address]   [place], [place] [address]  Phone: [phone]  Fax: [phone]

## 2023-08-01 NOTE — Unmapped (Signed)
 Hi Vincent Waters,    It was great to see*** you today. I look forward to seeing you at your next visit. In the meantime, please feel free to message me on mychart with any non-urgent questions or concerns (for example, refill requests for a long-term medication, or inquiries regarding vaccine availability). I do my best to respond to messages within 2 business days, however occasionally it may take longer depending on my other clinical duties.     For time-sensitive and/or new clinical concerns (including evaluation of new symptoms, medication adjustments, time-sensitive referral requests, or completion of disability or pre-employment paperwork), please call [phone] to request a phone, video, or in-person visit with me or one of my colleagues, so that we may address your concern as safely and expediently as possible.     Additionally, [facility] has an Urgent Care at our [facility] location! Call [phone] for same-day appointments, available Monday-Friday 7am-8pm; Saturday and Sunday 12pm-5pm. If you think you are having an emergency, please call 911 or go to your nearest emergency department.     If you ever need urgent help with your mental health, including for any thoughts of self-harm or suicide, please call the [place] mental health crisis line at [phone], or the national suicide prevention hotline at 210-108-5371.     Take care,    Claudie Revering, MD, MPH (he/him)  [facility] at [facility]  [address]   [place], [place] [address]  Phone: [phone]  Fax: [phone]

## 2023-08-03 ENCOUNTER — Ambulatory Visit
Admit: 2023-08-03 | Payer: BLUE CROSS/BLUE SHIELD | Attending: Rehabilitative and Restorative Service Providers" | Primary: Rehabilitative and Restorative Service Providers"

## 2023-08-03 ENCOUNTER — Ambulatory Visit
Admit: 2023-08-03 | Discharge: 2023-08-28 | Payer: BLUE CROSS/BLUE SHIELD | Attending: Rehabilitative and Restorative Service Providers" | Primary: Rehabilitative and Restorative Service Providers"

## 2023-08-03 NOTE — Unmapped (Signed)
[facility] AT [facility] [place]  OUTPATIENT PHYSICAL THERAPY  08/03/2023  Note Type: Treatment Note       Patient Name: Vincent Waters  Date of Birth:03-15-1981  Diagnosis:   Encounter Diagnoses   Name Primary?    Neck pain on right side Yes    Chronic right shoulder pain      Referring MD:  Dawna Part, *     Date of Onset of Impairment-09/18/2016  Date PT Care Plan Established or Reviewed-11/07/2022  Date PT Treatment Started-05/06/2022   Visit Count: 23  Plan of Care Effective Date:          Assessment/Plan:    Assessment  Assessment details:    Extensive discussion with Jahnay about how we will move forward.  I'm starting to think that his neck pain and headache could be related to his right shoulder/muscle issues (we were able to recreate his Thunderclap headache today with SCM and upper trapezius needling).  Advised him to keep track to see if his headache fizzles out after today, or if it escalates into a full blown headache and he agreed to keep track of this.  We will likely divide up his Plan of Care into above the waist' which would include shoulder, neck and headache and below the waist which will include low back and bilateral hips.  He agrees with his plan.  All questions answered.  This patient requires skilled physical therapy services to address the outlined impairments in order to return to his desired level of function.           Impairments: decreased strength, hypertonicity, pain and postural weakness                      Therapy Goals      Goals:      Short Term Goals: Updated 11/07/2022.   In 6 weeks:  Goal #1: Patient will be independent with progressively monitored home exercise program.   Baseline/Current: Patient is familiar with home exercise program and performs it consistently.     Goal #2: Patient will demonstrate cervical ROM within normal limits with minimal symptoms in order to allow for functional activities such as safe driving and looking before crossing the street.   Baseline/Current: COMPLETE.  Patient now has normal cervical ROM (see ROM testing).     Goal #3: Patient will demonstrate right shoulder range of motion within normal limits with minimal symptoms for proper glenohumeral joint mechanics in order to allow for daily functional tasks.   Baseline/Current: COMPLETE.  Patient has normal right shoulder range of motion (see ROM testing).     Goal #4: Patient will demonstrate improved posture without cuing.  Baseline/Current: PARTIALLY COMPLETE.  Patient has intermittently improved posture with right shoulder still sitting forward and ups.    Long Term Goals: Updated 11/07/2022.   In 12 weeks:  Goal #1: Patient will demonstrate right shoulder strength within normal limits with minimal symptoms in order to allow for pain free lifting and reaching with right upper extremity.   Baseline/Current: NEARLY COMPLETE.  Patient has significantly improved right shoulder strength with residual weakness (see manual muscle testing).     Goal #2: Patient will be able to reach over head with minimal symptoms in order to place/remove an object over shoulder height in a cabinet.  Baseline/Current: COMPLETE.  Patient can now reach over head without issue.    Goal #3: Patient will return to recreational activities (working out in  gym) without limit.  Baseline/Current: PARTIALLY COMPLETE.  Patient has returned to limited weight lifting.    Goal #4: Patient's FOTO score will improve from 34 at initial evaluation to >/= 52 at discharge in order to demonstrate a significant improvement in self-reported outcome measures that directly result from his course of physical therapy.   Baseline/Current: IN PROGRESS.  FOTO = 34    Plan    Therapy options: will be seen for skilled physical therapy services    Planned therapy interventions: Body Mechanics Training, Dry Needling, Education - Patient, Home Exercise Program, Manual Therapy, Neuromuscular Re-education, Postural Training, Therapeutic Activities and Therapeutic Exercises      Frequency: 1x week    Duration in weeks: 6    Education provided to: patient.    Education provided: HEP, Estate manager/land agent, Treatment options and plan, Symptom management and Body awareness    Education results: verbalized good understanding, demonstrates understanding and needs further instruction.    Communication/Consultation: N/A.    Next visit plan:        Posterior shoulder/depressor strengthening. Manual Therapy as needed.    Total Session Time: 45    Treatment rendered today:      Therapeutic Exercise: 40 minutes  Patient Education: Plan of Care and Home Exercise Program.     Manual Therapy: 5 minutes  Right upper Trapezius trigger point with 50mm x .30mm with Pinch/Pull Technique established for safety - Local Twitch Response produced  Right SCM trigger point with 30mm x .30mm with Pinch/Pull Technique established for safety - Local Twitch Response produced     Plan details: Access Code: [email or web address]  URL: [email or web address]  Date: 05/11/2023  Prepared by: Christianne Dolin    Exercises  - Standing Shoulder Horizontal Abduction with Resistance  - 1 x daily - 7 x weekly - 2 sets - 10 reps - 1-2 hold  - Shoulder External Rotation and Scapular Retraction with Resistance  - 1 x daily - 7 x weekly - 2 sets - 10 reps  - Standing Shoulder flexion Wall Slide (Mirrored)  - 1 x daily - 7 x weekly - 1 sets - 15 reps  - Seated Shoulder Scaption AAROM with Pulley at Side (Mirrored)  - 1 x daily - 7 x weekly - 3 sets - 10 reps  - Seated Upper Trapezius Stretch  - 1 x daily - 7 x weekly - 1 sets - 3 reps - 30 hold  - Standing Shoulder Internal Rotation Stretch with Hands Behind Back (Mirrored)  - 1 x daily - 7 x weekly - 1 sets - 15 reps - 5 hold  - Sleeper Stretch (Mirrored)  - 1 x daily - 7 x weekly - 1 sets - 10 reps - 10 hold  - Sidelying Shoulder Horizontal Abduction  - 1 x daily - 7 x weekly - 2 sets - 10 reps  - Chair Press Ups Forward and Backward  - 1 x daily - 7 x weekly - 1 sets - 10 reps - 5 hold  - Supine Pectoralis Stretch  - 1 x daily - 7 x weekly - 1 sets - 10 reps - 10 hold  - Prone Single Arm Shoulder Horizontal Abduction with Dumbbell - Palm Down  - 1 x daily - 7 x weekly - 2 sets - 10 reps  - Low Trap Setting at Wall  - 1 x daily - 7 x weekly - 2 sets - 10 reps - 5 hold  - Shoulder extension with resistance - Neutral  -  1 x daily - 7 x weekly - 2 sets - 10 reps  - CLX Tricep Pressdowns  - 1 x daily - 7 x weekly - 2 sets - 10 reps      Subjective:     History of Present Condition     Date of surgery:  11/16/2021    Date of onset:  09/18/2016    Date is approximation?: yes    Explanation:  Patient reports onset of symptoms in early 2018.       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 Lafave is a 43 y.o. right hand dominant adult who presents to outpatient physical therapy with right neck and shoulder pain that began in early 2018 when he was changing the oil in his car when it fell onto his sternum.  He went to the ER and got an x-ray which showed a sternal fracture.  Since then, he has had persistent issues and nerve pressure in his right neck/shoulder which led him to get a right anterior scalenectomy, middle scalenectomy, first rib resection and right Pectoralis minor release surgery performed on 11/16/2021 by Thora Lance, MD.  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  Subjective:  Mr. Doffing reports that he felt better after last session.  He immediately had trigger point injections and ablation after that, which helped to release it for short term, but then it locked again.  He has been doing his exercises consistently.  Pain:     Current pain rating:  3  Location:  Right neck/shoulder    Quality:  Tight and aching    Progression: Improved      Objective:       Palpation     Right Shoulder   Hypertonic in the pectoralis minor and upper trapezius.   Tenderness of the pectoralis minor and upper trapezius.     Right Cervical/Thoracic   Hypertonic in the pectoralis minor, scalenes, sternocleidomastoid and upper trapezius.   Tenderness of the pectoralis minor and upper trapezius.     Joint Mobility  Shoulder  Right Shoulder Joint Mobility    Posterior capsule: hypomobile                                  I attest that I have reviewed the above information.  Signed: Patton Salles, PT  08/03/2023 1:07 PM

## 2023-08-03 NOTE — Unmapped (Signed)
Addended by: Marylyn Ishihara on: 08/03/2023 01:52 PM     Modules accepted: Orders

## 2023-08-04 NOTE — Unmapped (Signed)
 Patient Advice Request Patient Name: Vincent Waters [facility]  Caller: Self (Patient)  Contact Method: Telephone Call: Time- Any Time [phone] or MyChart  Reason for Call: Patient stated he was seen by a PT who dry needled his back. Patient was instructed to follow back up with you if the pain worsened and it has. Patient said the pain is now going from his back to his side and its getting unbearable. He wanted to know if you think he needs an MRI and if so can you reach out to him and let him know when you place the order. Patient will go to the ER if the pain continues to increase.  Previously Discussed: yes  Appointment Offered: No  If offered accepted, scheduled appt date:

## 2023-08-07 NOTE — Unmapped (Signed)
 The [facility] has made a second and final attempt to reach this patient to refill the following medication:XOLAIR 300 mg/2 mL auto-injector (omalizumab).      We have left voicemails on the following phone numbers: [phone], have sent a MyChart message, have sent a text message to the following phone numbers: [phone], and have sent a Mychart questionnaire..    Dates contacted: 02/13, 02/17  Last scheduled delivery: 01/24    The patient may be at risk of non-compliance with this medication. The patient should call the [facility] at [phone]  Option 4, then Option 3: Allergy, Immunology, Pulmonary, Neurology to refill medication.    Dan Europe   [facility] and [facility]

## 2023-08-09 NOTE — Unmapped (Signed)
 [facility] Specialty and Home Delivery Pharmacy Refill Coordination Note    Vincent Waters, [place]: 08/26/1980  Phone: [phone] (home) [phone] (work)      All above HIPAA information was verified with patient.         08/09/2023     6:09 AM   Specialty Rx Medication Refill Questionnaire   Which Medications would you like refilled and shipped? Xolair   Please list all current allergies: Penicillin   Have you missed any doses in the last 30 days? No   Have you had any changes to your medication(s) since your last refill? No   How many days remaining of each medication do you have at home? 0   Have you experienced any side effects in the last 30 days? No   Please enter the full address (street address, city, state, zip code) where you would like your medication(s) to be delivered to. [address] [place] [address]   Please specify on which day you would like your medication(s) to arrive. Note: if you need your medication(s) within 3 days, please call the pharmacy to schedule your order at [phone]  08/11/2023   Has your insurance changed since your last refill? No   Would you like a pharmacist to call you to discuss your medication(s)? No   Do you require a signature for your package? (Note: if we are billing Medicare Part B or your order contains a controlled substance, we will require a signature) No         Completed refill call assessment today to schedule patient's medication shipment from the [facility] Specialty and Home Delivery Pharmacy [phone]).  All relevant notes have been reviewed.       Confirmed patient received a Conservation officer, historic buildings and a Surveyor, mining with first shipment. The patient will receive a drug information handout for each medication shipped and additional FDA Medication Guides as required.         REFERRAL TO PHARMACIST     Referral to the pharmacist: Not needed      [facility]     Shipping address confirmed in Epic.     Delivery Scheduled: Yes, Expected medication delivery date: 08/11/23.     Medication will be delivered via Same Day Courier to the prescription address in Epic WAM.    Dan Europe   [facility] Specialty and Home Delivery Pharmacy Specialty Technician

## 2023-08-11 MED FILL — XOLAIR 300 MG/2 ML SUBCUTANEOUS AUTO-INJECTOR: SUBCUTANEOUS | 28 days supply | Qty: 4 | Fill #6

## 2023-08-14 NOTE — Unmapped (Signed)
 [facility] Specialty and Home Delivery Pharmacy Refill Coordination Note    Specialty Medication(s) to be Shipped:   CF/Pulmonary/Asthma: Xolair 75mg /0.56mL    Other medication(s) to be shipped: No additional medications requested for fill at this time     [facility], DOB: 06/22/1980  Phone: [phone] (home) [phone] (work)      All above HIPAA information was verified with patient.     Was a Nurse, learning disability used for this call? No    Completed refill call assessment today to schedule patient's medication shipment from the [facility] and Home Delivery Pharmacy  [phone]).  All relevant notes have been reviewed.     Specialty medication(s) and dose(s) confirmed: Regimen is correct and unchanged.   Changes to medications: [place] reports no changes at this time.  Changes to insurance: No  New side effects reported not previously addressed with a pharmacist or physician: None reported  Questions for the pharmacist: No    Confirmed patient received a Conservation officer, historic buildings and a Surveyor, mining with first shipment. The patient will receive a drug information handout for each medication shipped and additional FDA Medication Guides as required.       DISEASE/MEDICATION-SPECIFIC INFORMATION        For patients on injectable medications: Patient currently has 0 doses left.  Next injection is scheduled for 08/19/2023.    SPECIALTY MEDICATION ADHERENCE     Medication Adherence    Patient reported X missed doses in the last month: 0  Specialty Medication: XOLAIR 300 mg/2 mL auto-injector (omalizumab)  Patient is on additional specialty medications: No              Were doses missed due to medication being on hold? No    Xolair 75mg /0.68mL : 0 doses of medicine on hand   Xolair 300mg /52mL : 0 doses of medicine on hand       REFERRAL TO PHARMACIST     Referral to the pharmacist: Not needed      SHIPPING     Shipping address confirmed in Epic.       Delivery Scheduled: Yes, Expected medication delivery date: 08/16/2023.     Medication will be delivered via Same Day Courier to the prescription address in Epic WAM.    Elnora Morrison, PharmD   [facility] Specialty and Home Delivery Pharmacy  Specialty Pharmacist

## 2023-08-15 NOTE — Unmapped (Signed)
 Patient Advice Request Patient Name: Vincent [facility]  Caller: Self (Patient)  Contact Method: Telephone Call: Time- Any Time [phone] or MyChart  Reason for Call: Patient state that his authorization for his MRI got denied. He stated that he was told the reason for the MRI was not valid enough and more information as needed.  Previously Discussed: yes  Appointment Offered: No  If offered accepted, scheduled appt date:

## 2023-08-16 MED FILL — XOLAIR 75 MG/0.5 ML SUBCUTANEOUS AUTO-INJECTOR: SUBCUTANEOUS | 28 days supply | Qty: 1 | Fill #6

## 2023-08-16 NOTE — Unmapped (Signed)
 [facility] [place]  OUTPATIENT PHYSICAL THERAPY  08/16/2023  Note Type: Treatment Note       Patient Name: Vincent [place]  Date of Birth:08-07-1980  Diagnosis:   Encounter Diagnoses   Name Primary?    Neck pain on right side Yes    Chronic right shoulder pain      Referring MD:  V[place], *     Date of Onset of Impairment-09/18/2016  Date PT Care Plan Established or Reviewed-11/07/2022  Date PT Treatment Started-05/06/2022   Visit Count: 24  Plan of Care Effective Date:          Assessment/Plan:    Assessment  Assessment details:    Vincent Waters continues to get relief and respond well to Manual Therapy.  It's becoming apparent that his headaches seem to be related to the muscular issues in his neck and shoulder.  During TDN, he describes the onset of a headache, but it seems to fizzle out once the needling is done.  Recommended that he return to the gym and focus on strengthening with rows, reverse flys, and press downs.  Also, recommended that he try and do his Home Exercise Program 3x/week.  He agreed to follow these recommendations.  All questions answered.  This patient requires skilled physical therapy services to address the outlined impairments in order to return to his desired level of function.           Impairments: decreased strength, hypertonicity, pain and postural weakness                      Therapy Goals      Goals:      Short Term Goals: Updated 11/07/2022.   In 6 weeks:  Goal #1: Patient will be independent with progressively monitored home exercise program.   Baseline/Current: Patient is familiar with home exercise program and performs it consistently.     Goal #2: Patient will demonstrate cervical ROM within normal limits with minimal symptoms in order to allow for functional activities such as safe driving and looking before crossing the street.   Baseline/Current: COMPLETE.  Patient now has normal cervical ROM (see ROM testing).     Goal #3: Patient will demonstrate right shoulder range of motion within normal limits with minimal symptoms for proper glenohumeral joint mechanics in order to allow for daily functional tasks.   Baseline/Current: COMPLETE.  Patient has normal right shoulder range of motion (see ROM testing).     Goal #4: Patient will demonstrate improved posture without cuing.  Baseline/Current: PARTIALLY COMPLETE.  Patient has intermittently improved posture with right shoulder still sitting forward and ups.    Long Term Goals: Updated 11/07/2022.   In 12 weeks:  Goal #1: Patient will demonstrate right shoulder strength within normal limits with minimal symptoms in order to allow for pain free lifting and reaching with right upper extremity.   Baseline/Current: NEARLY COMPLETE.  Patient has significantly improved right shoulder strength with residual weakness (see manual muscle testing).     Goal #2: Patient will be able to reach over head with minimal symptoms in order to place/remove an object over shoulder height in a cabinet.  Baseline/Current: COMPLETE.  Patient can now reach over head without issue.    Goal #3: Patient will return to recreational activities (working out in gym) without limit.  Baseline/Current: PARTIALLY COMPLETE.  Patient has returned to limited weight lifting.    Goal #4: Patient's FOTO score will  improve from 34 at initial evaluation to >/= 52 at discharge in order to demonstrate a significant improvement in self-reported outcome measures that directly result from his course of physical therapy.   Baseline/Current: IN PROGRESS.  FOTO = 34    Plan    Therapy options: will be seen for skilled physical therapy services    Planned therapy interventions: Body Mechanics Training, Dry Needling, Education - Patient, Home Exercise Program, Manual Therapy, Neuromuscular Re-education, Postural Training, Therapeutic Activities and Therapeutic Exercises      Frequency: 1x week    Duration in weeks: 6    Education provided to: patient. Education provided: HEP, Estate manager/land agent, Treatment options and plan, Symptom management and Body awareness    Education results: verbalized good understanding, demonstrates understanding and needs further instruction.    Communication/Consultation: N/A.    Next visit plan:        Posterior shoulder/depressor strengthening. Manual Therapy as needed.    Total Session Time: 30    Treatment rendered today:      Manual Therapy: 30 minutes  - GHJ AP in various abduction and PROM IR  - GHJ Scaption with inferior glide  - Pin and Stretch to Pec's  - Side Lying Pec Stretch with Quick Stretches  [place]  was informed of the risks associated with dry needling and he signed consent form.  Patient screened for any contraindications to needling including pregnancy, infection, antibiotic use, anticoagulants use, autoimmune issues, uncontrolled diabetes, and active cancer/treatment and does not have any of these conditions.  DRY NEEDLING FOR RELEASING IDENTIFIED TRIGGER POINTS TO FACILITATE MANUAL THERAPY TECHNIQUES AND ALLOW MUSCLE TO RETURN TO OPTIMAL LENGTH (NO CHARGE).  Right SCM trigger point with 30mm x .30mm with Pinch/Pull Technique established for safety - Local Twitch Response produced     Right Upper Trapezius trigger point with 50mm x .30mm with Pinch/Pull Technique established for safety - Local Twitch Response produced   Right Pectoralis Minor trigger point with 50mm x .30mm with Pinch/Pull Technique established for safety - Local Twitch Response produced  Bilateral Cervical Multifidi C2 - C4 trigger point with 50mm x .30mm with Bony Backdrop established for safety - Local Twitch Response produced    Plan details: Access Code: [email or web address]  URL: [email or web address]  Date: 05/11/2023  Prepared by: [place]    Exercises  - Standing Shoulder Horizontal Abduction with Resistance  - 1 x daily - 7 x weekly - 2 sets - 10 reps - 1-2 hold  - Shoulder External Rotation and Scapular Retraction with Resistance  - 1 x daily - 7 x weekly - 2 sets - 10 reps  - Standing Shoulder flexion Wall Slide (Mirrored)  - 1 x daily - 7 x weekly - 1 sets - 15 reps  - Seated Shoulder Scaption AAROM with Pulley at Side (Mirrored)  - 1 x daily - 7 x weekly - 3 sets - 10 reps  - Seated Upper Trapezius Stretch  - 1 x daily - 7 x weekly - 1 sets - 3 reps - 30 hold  - Standing Shoulder Internal Rotation Stretch with Hands Behind Back (Mirrored)  - 1 x daily - 7 x weekly - 1 sets - 15 reps - 5 hold  - Sleeper Stretch (Mirrored)  - 1 x daily - 7 x weekly - 1 sets - 10 reps - 10 hold  - Sidelying Shoulder Horizontal Abduction  - 1 x daily - 7 x weekly - 2 sets - 10 reps  -  Chair Press Ups Forward and Backward  - 1 x daily - 7 x weekly - 1 sets - 10 reps - 5 hold  - Supine Pectoralis Stretch  - 1 x daily - 7 x weekly - 1 sets - 10 reps - 10 hold  - Prone Single Arm Shoulder Horizontal Abduction with Dumbbell - Palm Down  - 1 x daily - 7 x weekly - 2 sets - 10 reps  - Low Trap Setting at Wall  - 1 x daily - 7 x weekly - 2 sets - 10 reps - 5 hold  - Shoulder extension with resistance - Neutral  - 1 x daily - 7 x weekly - 2 sets - 10 reps  - CLX Tricep Pressdowns  - 1 x daily - 7 x weekly - 2 sets - 10 reps      Subjective:     History of Present Condition     Date of surgery:  11/16/2021    Date of onset:  09/18/2016    Date is approximation?: yes    Explanation:  Patient reports onset of symptoms in early 2018.       Surgical Procedure:  Other (Right anterior scalenectomy, middle scalenectomy, first rib resection and right Pectoralis minor release)    History of Present Condition/Chief Complaint:  Vincent [place] is a 43 y.o. right hand dominant adult who presents to outpatient physical therapy with right neck and shoulder pain that began in early 2018 when he was changing the oil in his car when it fell onto his sternum.  He went to the ER and got an x-ray which showed a sternal fracture.  Since then, he has had persistent issues and nerve pressure in his right neck/shoulder which led him to get a right anterior scalenectomy, middle scalenectomy, first rib resection and right Pectoralis minor release surgery performed on 11/16/2021 by Thora Lance, MD.  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  Subjective:  Vincent [place] reports that he felt better for 7-10 days after last appointment, and things tightened up again.  He noticed that his headache started and then fizzled out after a period of times.  He has noticed that when his shoulder starts to tighten up, his head ache starts.  He has been doing his exercises consistently 3x/week.  Pain:     Current pain rating:  4  Location:  Right neck/shoulder    Quality:  Tight and aching    Progression:  Improved      Objective:       Palpation     Right Shoulder   Hypertonic in the pectoralis minor and upper trapezius.   Tenderness of the pectoralis minor and upper trapezius.     Right Cervical/Thoracic   Hypertonic in the pectoralis minor, scalenes, sternocleidomastoid and upper trapezius.   Tenderness of the pectoralis minor and upper trapezius.     Joint Mobility  Shoulder  Right Shoulder Joint Mobility    Posterior capsule: hypomobile                                  I attest that I have reviewed the above information.  Signed: Patton Salles, PT  08/16/2023 1:43 PM

## 2023-08-17 ENCOUNTER — Encounter
Admit: 2023-08-17 | Discharge: 2023-08-18 | Payer: BLUE CROSS/BLUE SHIELD | Attending: Psychiatric/Mental Health | Primary: Psychiatric/Mental Health

## 2023-08-17 DIAGNOSIS — F902 Attention-deficit hyperactivity disorder, combined type: Principal | ICD-10-CM

## 2023-08-17 DIAGNOSIS — F411 Generalized anxiety disorder: Principal | ICD-10-CM

## 2023-08-17 MED ORDER — CLONAZEPAM 0.5 MG TABLET
ORAL_TABLET | Freq: Two times a day (BID) | ORAL | 5 refills | 30 days | Status: CP | PRN
Start: 2023-08-17 — End: 2024-02-13
  Filled 2023-09-01: qty 30, 30d supply, fill #0

## 2023-08-17 MED ORDER — DEXTROAMPHETAMINE-AMPHETAMINE 20 MG TABLET
ORAL_TABLET | Freq: Every day | ORAL | 0 refills | 30.00 days | Status: CP
Start: 2023-08-17 — End: 2023-09-16
  Filled 2023-08-18: qty 30, 30d supply, fill #0

## 2023-08-17 NOTE — Unmapped (Addendum)
 Follow-up instructions:  -- Please continue taking your medications as prescribed for your mental health.   -- Do not make changes to your medications, including taking more or less than prescribed, unless under the supervision of your physician. Be aware that some medications may make you feel worse if abruptly stopped.  -- Please refrain from using illicit substances, as these can affect your mood and could cause anxiety or other concerning symptoms.   -- Seek further medical care for any increase in symptoms or new symptoms such as thoughts of wanting to hurt yourself or hurt others.     Contact info:  Life-threatening emergencies: call 911 or go to the nearest ER for medical or psychiatric attention.     Urgent questions, Non-urgent routine concerns, questions, and refill requests: please call the clinic at [phone].    Regarding appointments:  - If you need to cancel your appointment, we ask that you call  [phone]  at least 24 hours before your scheduled appointment.  - If for any reason you arrive 15 minutes later than your scheduled appointment time, you may not be seen and your visit may be rescheduled.  - Please remember that we will not automatically reschedule missed appointments.  - If you miss two (2) appointments without letting us know in advance, you will likely be referred to a provider in your community.  - We will do our best to be on time. Sometimes an emergency will arise that might cause your clinician to be late. We will try to inform you of this when you check in for your appointment. If you wait more than 15 minutes past your appointment time without such notice, please speak with the front desk staff.    In the event of bad weather, the clinic staff will attempt to contact you, should your appointment need to be rescheduled. Additionally, you can call the Patient Weather Line [phone] for system-wide clinic status     For more information and reminders regarding clinic policies (these were provided when you were admitted to the clinic), please ask the front desk.      Current Outpatient Medications:     albuterol 2.5 mg /3 mL (0.083 %) nebulizer solution, Inhale 3 mL (2.5 mg total) by nebulization every six (6) hours as needed for wheezing or shortness of breath., Disp: 90 mL, Rfl: 1    azelastine (ASTELIN) 137 mcg (0.1 %) nasal spray, Instill 2 sprays into each nostril two (2) times a day as needed for rhinitis., Disp: 30 mL, Rfl: 1    budesonide-formoterol (SYMBICORT) 160-4.5 mcg/actuation inhaler, Inhale 1 puff by mouth Two (2) times a day., Disp: 10.2 g, Rfl: 5    cetirizine (ZYRTEC) 10 MG tablet, Take 1 tablet (10 mg total) by mouth daily., Disp: 30 tablet, Rfl: 12    clonazePAM (KLONOPIN) 0.5 MG tablet, Take 0.5 tablets (0.25 mg total) by mouth two (2) times a day as needed for anxiety., Disp: 30 tablet, Rfl: 5    dextroamphetamine-amphetamine (ADDERALL) 20 mg tablet, Take 1 tablet (20 mg total) by mouth daily., Disp: 30 tablet, Rfl: 0    [START ON 08/18/2023] dextroamphetamine-amphetamine (ADDERALL) 20 mg tablet, Take 1 tablet (20 mg total) by mouth daily. (Patient not taking: Reported on 07/25/2023), Disp: 30 tablet, Rfl: 0    [START ON 09/17/2023] dextroamphetamine-amphetamine (ADDERALL) 20 mg tablet, Take 1 tablet (20 mg total) by mouth daily., Disp: 30 tablet, Rfl: 0    dextroamphetamine-amphetamine (ADDERALL) 20 mg tablet, Take 1 tablet (  20 mg total) by mouth daily., Disp: 30 tablet, Rfl: 0    [START ON 09/16/2023] dextroamphetamine-amphetamine (ADDERALL) 20 mg tablet, Take 1 tablet (20 mg total) by mouth daily., Disp: 30 tablet, Rfl: 0    [START ON 10/16/2023] dextroamphetamine-amphetamine (ADDERALL) 20 mg tablet, Take 1 tablet (20 mg total) by mouth daily., Disp: 30 tablet, Rfl: 0    diclofenac sodium (VOLTAREN) 1 % gel, Apply 2 g topically four (4) times a day., Disp: 100 g, Rfl: 0    emtricitabine-tenofovir alafen (DESCOVY) 200-25 mg tablet, Take 1 tablet by mouth daily., Disp: 90 tablet, Rfl: 0    EPINEPHrine (EPIPEN) 0.3 mg/0.3 mL injection, Inject 0.3 mL (0.3 mg total) under the skin once for 1 dose., Disp: 2 each, Rfl: 0    gabapentin (NEURONTIN) 250 mg/5 mL oral solution, Take 12 mL (600 mg total) by mouth Three (3) times a day., Disp: 1080 mL, Rfl: 2    inhalational spacing device Spcr, Use as directed with albuterol and symbicort, Disp: 1 each, Rfl: 1    ipratropium (ATROVENT) 42 mcg (0.06 %) nasal spray, 1-2 sprays in each nostril as needed for drainage, up to 4 time daily., Disp: 15 mL, Rfl: 2    metoclopramide (REGLAN) 10 MG tablet, Take 1/2 tablet (5 mg total) by mouth Three (3) times a day with a meal., Disp: 135 tablet, Rfl: 3    nebulizer and compressor (COMP-AIR NEBULIZER COMPRESSOR) Devi, 1 each by Miscellaneous route nightly as needed., Disp: 1 each, Rfl: 0    needle, disp, 25 Shamarion (BD REGULAR BEVEL NEEDLES) 25 Athanasios x 5/8 Ndle, For subcutaneous hormone injection., Disp: 25 each, Rfl: 0    nifedipine 0.3% lidocaine 1.5% in petrolatum ointment, Apply a pea-size amount to anal area 3 times a day, Disp: 100 g, Rfl: 0    omalizumab (XOLAIR) 300 mg/2 mL auto-injector, Inject the contents of 1 pen (300 mg) under the skin every fourteen (14) days., Disp: 4 mL, Rfl: 11    omalizumab 75 mg/0.5 mL AtIn, Inject the contents of 1 pen (75 mg) under the skin every fourteen (14) days. (Patient not taking: Reported on 07/25/2023), Disp: 1 mL, Rfl: 11    pantoprazole (PROTONIX) 40 MG tablet, Take 1 tablet (40 mg total) by mouth daily., Disp: 90 tablet, Rfl: 3    propranolol (INDERAL) 10 MG tablet, Take 1 tablet (10 mg total) by mouth two (2) times a day., Disp: 60 tablet, Rfl: 11    prucalopride (MOTEGRITY) 2 mg Tab, Take 1 tablet (2 mg total) by mouth daily., Disp: 90 tablet, Rfl: 3    safety needles (BD ECLIPSE) 25 Anacleto x 1 Ndle, use for testosterone, Disp: 12 each, Rfl: 0    safety needles (BD SAFETYGLIDE NEEDLE) 18 Wally x 1 1/2 Ndle, For drawing hormone injection, Disp: 25 each, Rfl: 0    syringe, disposable, (EASY TOUCH LUER LOCK SYRINGE) 1 mL Syrg, Use for weekly hormone injection., Disp: 4 each, Rfl: 0    syringe, disposable, (EASY TOUCH LUER LOCK SYRINGE) 1 mL Syrg, Use for testosterone, Disp: 12 each, Rfl: 0    syringe, disposable, 2.5 mL Syrg, Use weekly, Disp: 30 Syringe, Rfl: 2    testosterone cypionate (DEPOTESTOTERONE CYPIONATE) 200 mg/mL injection, Inject 0.2 mL (40 mg total) into the muscle once a week., Disp: 5 mL, Rfl: 0    triamcinolone (KENALOG) 0.1 % cream, Apply topically Two (2) times a day., Disp: 15 g, Rfl: 0    Current Facility-Administered Medications:  omalizumab [name]) injection 300 mg, 300 mg, Subcutaneous, Q28 Days, [name], [name], [name], 300 mg at 05/24/22 1138

## 2023-08-17 NOTE — Unmapped (Signed)
 [facility]  Psychiatry   Established Patient E&M Service - Outpatient                                                              [facility] [place]    Name: Vincent [place]  Date: 08/17/2023  MRN: [id number]  DOB: Oct 07, 1980  PCP: Linus Salmons, MD    Assessment:    [name] presents for follow-up evaluation. Pt is pleasant and cooperative. Pt reports he feels the Adderall has been very helpful for attention and focus, no AE's with med. Pt is doing well psychiatrically, no safety concerns, follow up in 3 months.     Identifying Information:  Vincent [place] is a 43 y.o. adult with a history of GAD, Panic disorder, ADHD. Pt has long hx of anxiety, ADHD (since age 34), former dx of Bipolar disorder which has been unfounded.    Risk Assessment:  An assessment of suicide and violence risk factors was performed as part of this evaluation and is not significantly increased from the last visit.   While future psychiatric events cannot be accurately predicted, the patient does not currently require acute inpatient psychiatric care and does not currently meet [facility] involuntary commitment criteria.      Plan:    Problem: GAD/Panic disorder  Status of problem: chronic and stable  Interventions:   -- continue Klonopin 0.25mg  PO BID for anxiety/panic  -- pt has therapist he was referred to, will make appt  -- pt has long hx of being on multiple past meds, did not respond well to SSRI's     Problem: ADHD  Status of problem: chronic with moderate to severe exacerbation  Interventions:   -- RESTART Adderall 20mg  PO Qday (pt prefers immediate release over XR formulation). Pt was dx as child, started on Ritalin age 43 and in supportive classes at school.     Psychotherapy provided:  No billable psychotherapy service provided.      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 Arlyce Dice    Labs:    Not indicated    Guardian:  self    Therapy:   - pt reports Dr. Arlyce Dice has referred him to a therapist    Refills sent: 6 months of Klonopin , 3 month daisy chain of adderall    Scheduled follow up visit:  11/09/23 at 10:30am, video, 30 min with Abby Payson Evrard      Patient has been given information on how to contact this clinician for concerns. The patient has been instructed to call 911 for emergencies.  Subjective:    Interval History: Met with pt via video, pt reports he is doing well. Adderall has been very helpful.     Feeling better  PT figured out headache situation - muscle in neck - issue with muscle, likely cause, needs MRI   Thunderclap headaches    Back on Adderall - a lot more focused and calmed down  Seeing friends more and getting out of house    Less anxiety when getting out of house  Feeling normal, back to myself - getting there    PCP making therapy referral - has someone in mind who specializes in trans people    Sleeping well  Eating healthier  PT wants him to go to gym    Denies SI/HI    Objective:    Mental Status Exam:  Appearance:    Appears stated age, Well nourished, Well developed, and Clean/Neat   Motor:   No abnormal movements   Speech/Language:    Normal rate, volume, tone, fluency   Mood:    Good   Affect:   Calm, Cooperative, and Euthymic   Thought process and Associations:   Logical, linear, clear, coherent, goal directed   Abnormal/psychotic thought content:     Denies SI, HI, self harm, delusions, obsessions, paranoid ideation, or ideas of reference   Perceptual disturbances:     Denies auditory and visual hallucinations, behavior not concerning for response to internal stimuli     Other:          Visit was completed by video (or phone) and the appropriate disclaimer has been included below.      The patient reports they are physically located in [place] and is currently: not at home - in car. I conducted a audio/video visit. I spent  22m 45s on the video call with the patient. I spent an additional 24 minutes on pre- and post-visit activities on the date of service .       I personally spent 36 minutes face-to-face and non-face-to-face in the care of this patient, which includes all pre, intra, and post visit time on the date of service. All documented time was specific to E/M and does not include any procedures that may have been performed.    Keane Scrape, PMHNP  August 17, 2023 11:11 AM

## 2023-08-17 NOTE — Unmapped (Signed)
 I meet with pt today - will refill at appt

## 2023-08-18 MED ORDER — DEXTROAMPHETAMINE-AMPHETAMINE 20 MG TABLET
ORAL_TABLET | Freq: Every day | ORAL | 0 refills | 30.00 days | Status: CP
Start: 2023-08-18 — End: 2023-09-17

## 2023-08-22 NOTE — Unmapped (Signed)
 [facility] THERAPY SERVICES AT [facility] [place]  OUTPATIENT PHYSICAL THERAPY  08/22/2023  Note Type: Treatment Note       Patient Name: Vincent Waters  Date of Birth:[birth year]  Diagnosis:   Encounter Diagnoses   Name Primary?    Neck pain on right side Yes    Chronic right shoulder pain      Referring MD:  Dawna Part, *     Date of Onset of Impairment-09/18/2016  Date PT Care Plan Established or Reviewed-11/07/2022  Date PT Treatment Started-05/06/2022   Visit Count: 25  Plan of Care Effective Date:          Assessment/Plan:    Assessment  Assessment details:    Vincent Waters continues to get relief and respond well to Manual Therapy.  Reviewed his current gym program.  Advised him to discontinue seated press down machine, as that seems to be exacerbating his pec tightness and to the chair press instead.  Also, recommended he do some straight arm lat press down.  He agreed to do this.  Agreed to set up a lumbar evaluation to figure out what's going on with his back.  Written instructions given for supine pelvic tilt in the meantime.  He committed to performing them consistently as part of his home exercise program. All questions answered.  This patient requires skilled physical therapy services to address the outlined impairments in order to return to his desired level of function.           Impairments: decreased strength, hypertonicity, pain and postural weakness                      Therapy Goals      Goals:      Short Term Goals: Updated 11/07/2022.   In 6 weeks:  Goal #1: Patient will be independent with progressively monitored home exercise program.   Baseline/Current: Patient is familiar with home exercise program and performs it consistently.     Goal #2: Patient will demonstrate cervical ROM within normal limits with minimal symptoms in order to allow for functional activities such as safe driving and looking before crossing the street.   Baseline/Current: COMPLETE.  Patient now has normal cervical ROM (see ROM testing).     Goal #3: Patient will demonstrate right shoulder range of motion within normal limits with minimal symptoms for proper glenohumeral joint mechanics in order to allow for daily functional tasks.   Baseline/Current: COMPLETE.  Patient has normal right shoulder range of motion (see ROM testing).     Goal #4: Patient will demonstrate improved posture without cuing.  Baseline/Current: PARTIALLY COMPLETE.  Patient has intermittently improved posture with right shoulder still sitting forward and ups.    Long Term Goals: Updated 11/07/2022.   In 12 weeks:  Goal #1: Patient will demonstrate right shoulder strength within normal limits with minimal symptoms in order to allow for pain free lifting and reaching with right upper extremity.   Baseline/Current: NEARLY COMPLETE.  Patient has significantly improved right shoulder strength with residual weakness (see manual muscle testing).     Goal #2: Patient will be able to reach over head with minimal symptoms in order to place/remove an object over shoulder height in a cabinet.  Baseline/Current: COMPLETE.  Patient can now reach over head without issue.    Goal #3: Patient will return to recreational activities (working out in gym) without limit.  Baseline/Current: PARTIALLY COMPLETE.  Patient has returned to limited weight lifting.  Goal #4: Patient's FOTO score will improve from 34 at initial evaluation to >/= 52 at discharge in order to demonstrate a significant improvement in self-reported outcome measures that directly result from his course of physical therapy.   Baseline/Current: IN PROGRESS.  FOTO = 34    Plan    Therapy options: will be seen for skilled physical therapy services    Planned therapy interventions: Body Mechanics Training, Dry Needling, Education - Patient, Home Exercise Program, Manual Therapy, Neuromuscular Re-education, Postural Training, Therapeutic Activities and Therapeutic Exercises      Frequency: 1x week    Duration in weeks: 6    Education provided to: patient.    Education provided: HEP, Estate manager/land agent, Treatment options and plan, Symptom management and Body awareness    Education results: verbalized good understanding, demonstrates understanding and needs further instruction.    Communication/Consultation: N/A.    Next visit plan:        Posterior shoulder/depressor strengthening. Manual Therapy as needed.    Total Session Time: 45    Treatment rendered today:      Therapeutic Exercise: 15 minutes  Patient Education: Plan of Care and Home Exercise Program.   Exercises:  - Gym Program/HEP Review  - Supine Pelvic Tilt    Manual Therapy: 30 minutes  - GHJ AP in various abduction and PROM IR  - GHJ Scaption with inferior glide  - Pin and Stretch to Pec's  - Side Lying Pec Stretch with Quick Stretches  Vincent Waters [facility]  was informed of the risks associated with dry needling and he signed consent form.  Patient screened for any contraindications to needling including pregnancy, infection, antibiotic use, anticoagulants use, autoimmune issues, uncontrolled diabetes, and active cancer/treatment and does not have any of these conditions.  DRY NEEDLING FOR RELEASING IDENTIFIED TRIGGER POINTS TO FACILITATE MANUAL THERAPY TECHNIQUES AND ALLOW MUSCLE TO RETURN TO OPTIMAL LENGTH (NO CHARGE).  Right SCM trigger point with 30mm x .30mm with Pinch/Pull Technique established for safety - Local Twitch Response produced     Right Upper Trapezius trigger point with 50mm x .30mm with Pinch/Pull Technique established for safety - Local Twitch Response produced   Right Pectoralis Minor trigger point with 50mm x .30mm with Pinch/Pull Technique established for safety - Local Twitch Response produced    Plan details: Access Code: [email or web address]  URL: [email or web address]  Date: 05/11/2023  Prepared by: Christianne Dolin    Exercises  - Standing Shoulder Horizontal Abduction with Resistance  - 1 x daily - 7 x weekly - 2 sets - 10 reps - 1-2 hold  - Shoulder External Rotation and Scapular Retraction with Resistance  - 1 x daily - 7 x weekly - 2 sets - 10 reps  - Standing Shoulder flexion Wall Slide (Mirrored)  - 1 x daily - 7 x weekly - 1 sets - 15 reps  - Seated Shoulder Scaption AAROM with Pulley at Side (Mirrored)  - 1 x daily - 7 x weekly - 3 sets - 10 reps  - Seated Upper Trapezius Stretch  - 1 x daily - 7 x weekly - 1 sets - 3 reps - 30 hold  - Standing Shoulder Internal Rotation Stretch with Hands Behind Back (Mirrored)  - 1 x daily - 7 x weekly - 1 sets - 15 reps - 5 hold  - Sleeper Stretch (Mirrored)  - 1 x daily - 7 x weekly - 1 sets - 10 reps - 10 hold  - Sidelying Shoulder Horizontal Abduction  -  1 x daily - 7 x weekly - 2 sets - 10 reps  - Chair Press Ups Forward and Backward  - 1 x daily - 7 x weekly - 1 sets - 10 reps - 5 hold  - Supine Pectoralis Stretch  - 1 x daily - 7 x weekly - 1 sets - 10 reps - 10 hold  - Prone Single Arm Shoulder Horizontal Abduction with Dumbbell - Palm Down  - 1 x daily - 7 x weekly - 2 sets - 10 reps  - Low Trap Setting at Wall  - 1 x daily - 7 x weekly - 2 sets - 10 reps - 5 hold  - Shoulder extension with resistance - Neutral  - 1 x daily - 7 x weekly - 2 sets - 10 reps  - CLX Tricep Pressdowns  - 1 x daily - 7 x weekly - 2 sets - 10 reps      Subjective:     History of Present Condition     Date of surgery:  11/16/2021    Date of onset:  09/18/2016    Date is approximation?: yes    Explanation:  Patient reports onset of symptoms in early 2018.       Surgical Procedure:  Other (Right anterior scalenectomy, middle scalenectomy, first rib resection and right Pectoralis minor release)    History of Present Condition/Chief Complaint:  Vincent Waters is a 43 y.o. right hand dominant adult who presents to outpatient physical therapy with right neck and shoulder pain that began in early 2018 when he was changing the oil in his car when it fell onto his sternum.  He went to the ER and got an x-ray which showed a sternal fracture. Since then, he has had persistent issues and nerve pressure in his right neck/shoulder which led him to get a right anterior scalenectomy, middle scalenectomy, first rib resection and right Pectoralis minor release surgery performed on 11/16/2021 by Thora Lance, MD.  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  Subjective:  Vincent Waters reports that last appointment really helped, but the ball in his shoulder has come back.  His back is really bothering him right now.  He is doing his shoulder exercises consistently.  Pain:     Current pain rating:  4  Location:  Right neck/shoulder    Quality:  Tight and aching    Progression:  Improved      Objective:       Palpation     Right Shoulder   Hypertonic in the pectoralis minor and upper trapezius.   Tenderness of the pectoralis minor and upper trapezius.     Right Cervical/Thoracic   Hypertonic in the pectoralis minor, scalenes, sternocleidomastoid and upper trapezius.   Tenderness of the pectoralis minor and upper trapezius.     Joint Mobility  Shoulder  Right Shoulder Joint Mobility    Posterior capsule: hypomobile                                  I attest that I have reviewed the above information.  Signed: Patton Salles, PT  08/22/2023 1:02 PM

## 2023-08-23 ENCOUNTER — Ambulatory Visit
Admit: 2023-08-23 | Discharge: 2023-08-24 | Payer: BLUE CROSS/BLUE SHIELD | Attending: Student in an Organized Health Care Education/Training Program | Primary: Student in an Organized Health Care Education/Training Program

## 2023-08-23 DIAGNOSIS — Z79899 Other long term (current) drug therapy: Principal | ICD-10-CM

## 2023-08-23 DIAGNOSIS — R222 Localized swelling, mass and lump, trunk: Principal | ICD-10-CM

## 2023-08-23 DIAGNOSIS — M545 Low back pain radiating to right lower extremity: Principal | ICD-10-CM

## 2023-08-23 DIAGNOSIS — M79604 Pain in right leg: Principal | ICD-10-CM

## 2023-08-23 DIAGNOSIS — K649 Unspecified hemorrhoids: Principal | ICD-10-CM

## 2023-08-23 DIAGNOSIS — J4520 Mild intermittent asthma, uncomplicated: Principal | ICD-10-CM

## 2023-08-23 DIAGNOSIS — F640 Transsexualism: Principal | ICD-10-CM

## 2023-08-23 DIAGNOSIS — F319 Bipolar disorder, unspecified: Principal | ICD-10-CM

## 2023-08-23 MED ORDER — TESTOSTERONE CYPIONATE 200 MG/ML INTRAMUSCULAR OIL
INTRAMUSCULAR | 0 refills | 84.00 days | Status: CP
Start: 2023-08-23 — End: ?
  Filled 2023-09-01: qty 12, 84d supply, fill #0

## 2023-08-23 NOTE — Unmapped (Addendum)
 Continuing to experience hemorrhoids, requests colorectal surgery referral.   Orders:    Ambulatory referral to Colorectal Surgery; Future

## 2023-08-23 NOTE — Unmapped (Addendum)
 [name] is a transgender man on HRT for gender-affirming care. Pt has been doing well on current dose. No significant physical or emotional changes. No new medical concerns. Pt denies chest pain, shortness of breath, dizziness, lightheadedness, vision changes, or any other concerning symptoms. No active CAD, history of polycythemia vera, or hormone sensitive tumors. After discussion of risks/benefits, pt would like to continue current management. Will collect monitoring labs today per above. 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 including a marked incongruence between one???s experienced/expressed gender and primary and/or secondary sex characteristics; a strong desire to be rid of one???s primary and/or secondary sex characteristics because of a marked incongruence with one???s experienced/expressed gender; a strong desire for the primary and/or secondary sex characteristics of the other gender; a strong desire to be of the other gender (or some alternative gender different from one???s assigned gender); a strong desire to be treated as the other gender (or some alternative gender different from one???s assigned gender); and/or a strong conviction that one has the typical feelings and reactions of the other gender (or some alternative gender different from one???s assigned gender), and thus this treatment offers significant benefit for their physical and mental health.   - Continue HRT per above  - Labs today: testosterone, hematocrit  - PrEP eligibility: yes, see below  - Pregnancy risk assessment: s/p hysterectomy    Orders:    testosterone cypionate (DEPOTESTOTERONE CYPIONATE) 200 mg/mL injection; Inject 0.2 mL (40 mg total) into the muscle once a week.    Hematocrit; Future    Testosterone; Future

## 2023-08-23 NOTE — Unmapped (Addendum)
 No recent manic or hypomanic activity. Working on establishing with therapist. Previously worked with Dr. Matilde Haymaker at [facility], will ask if this is still possible. Also provided list of therapists in AVS. Continues to strongly resist mood stabilizers given negative experiences in the past. May be open to psych consult in the future, will readdress at follow-up visit.   - Attempt to re-establish therapy with Dr. Matilde Haymaker  - List of therapists provided in AVS  - Consider psych consult in the future given med history

## 2023-08-23 NOTE — Unmapped (Signed)
 Hi Nikolaus,    It was great to see*** you today. I look forward to seeing you at your next visit. In the meantime, please feel free to message me on mychart with any non-urgent questions or concerns (for example, refill requests for a long-term medication, or inquiries regarding vaccine availability). I do my best to respond to messages within 2 business days, however occasionally it may take longer depending on my other clinical duties.     For time-sensitive and/or new clinical concerns (including evaluation of new symptoms, medication adjustments, time-sensitive referral requests, or completion of disability or pre-employment paperwork), please call [phone] to request a phone, video, or in-person visit with me or one of my colleagues, so that we may address your concern as safely and expediently as possible.     Additionally, [facility] has an Urgent Care at our [facility] location! Call [phone] for same-day appointments, available Monday-Friday 7am-8pm; Saturday and Sunday 12pm-5pm. If you think you are having an emergency, please call 911 or go to your nearest emergency department.     If you ever need urgent help with your mental health, including for any thoughts of self-harm or suicide, please call the [place] mental health crisis line at [phone], or the national suicide prevention hotline at 210-108-5371.     Take care,    Claudie Revering, MD, MPH (he/him)  [facility] at [facility]  [address]   [place], [place] [address]  Phone: [phone]  Fax: [phone]

## 2023-08-23 NOTE — Unmapped (Unsigned)
 [facility] at [facility]  Established Patient Clinic Note    Assessment/Plan:   [name] is a 43 y.o.adult who presents for follow-up.    - Current meds:   Current Outpatient Medications:     albuterol 2.5 mg /3 mL (0.083 %) nebulizer solution, Inhale 3 mL (2.5 mg total) by nebulization every six (6) hours as needed for wheezing or shortness of breath., Disp: 90 mL, Rfl: 1    azelastine (ASTELIN) 137 mcg (0.1 %) nasal spray, Instill 2 sprays into each nostril two (2) times a day as needed for rhinitis., Disp: 30 mL, Rfl: 1    budesonide-formoterol (SYMBICORT) 160-4.5 mcg/actuation inhaler, Inhale 1 puff by mouth Two (2) times a day., Disp: 10.2 g, Rfl: 5    cetirizine (ZYRTEC) 10 MG tablet, Take 1 tablet (10 mg total) by mouth daily., Disp: 30 tablet, Rfl: 12    clonazePAM (KLONOPIN) 0.5 MG tablet, Take 0.5 tablets (0.25 mg total) by mouth two (2) times a day as needed for anxiety., Disp: 30 tablet, Rfl: 5    dextroamphetamine-amphetamine (ADDERALL) 20 mg tablet, Take 1 tablet (20 mg total) by mouth daily., Disp: 30 tablet, Rfl: 0    [START ON 09/17/2023] dextroamphetamine-amphetamine (ADDERALL) 20 mg tablet, Take 1 tablet (20 mg total) by mouth daily., Disp: 30 tablet, Rfl: 0    dextroamphetamine-amphetamine (ADDERALL) 20 mg tablet, Take 1 tablet (20 mg total) by mouth daily., Disp: 30 tablet, Rfl: 0    [START ON 09/16/2023] dextroamphetamine-amphetamine (ADDERALL) 20 mg tablet, Take 1 tablet (20 mg total) by mouth daily., Disp: 30 tablet, Rfl: 0    [START ON 10/16/2023] dextroamphetamine-amphetamine (ADDERALL) 20 mg tablet, Take 1 tablet (20 mg total) by mouth daily., Disp: 30 tablet, Rfl: 0    diclofenac sodium (VOLTAREN) 1 % gel, Apply 2 g topically four (4) times a day., Disp: 100 g, Rfl: 0    emtricitabine-tenofovir alafen (DESCOVY) 200-25 mg tablet, Take 1 tablet by mouth daily., Disp: 90 tablet, Rfl: 0    EPINEPHrine (EPIPEN) 0.3 mg/0.3 mL injection, Inject 0.3 mL (0.3 mg total) under the skin once for 1 dose., Disp: 2 each, Rfl: 0    gabapentin (NEURONTIN) 250 mg/5 mL oral solution, Take 12 mL (600 mg total) by mouth Three (3) times a day., Disp: 1080 mL, Rfl: 2    inhalational spacing device Spcr, Use as directed with albuterol and symbicort, Disp: 1 each, Rfl: 1    ipratropium (ATROVENT) 42 mcg (0.06 %) nasal spray, 1-2 sprays in each nostril as needed for drainage, up to 4 time daily., Disp: 15 mL, Rfl: 2    metoclopramide (REGLAN) 10 MG tablet, Take 1/2 tablet (5 mg total) by mouth Three (3) times a day with a meal., Disp: 135 tablet, Rfl: 3    nebulizer and compressor (COMP-AIR NEBULIZER COMPRESSOR) Devi, 1 each by Miscellaneous route nightly as needed., Disp: 1 each, Rfl: 0    needle, disp, 25 Cledis (BD REGULAR BEVEL NEEDLES) 25 Erik x 5/8 Ndle, For subcutaneous hormone injection., Disp: 25 each, Rfl: 0    nifedipine 0.3% lidocaine 1.5% in petrolatum ointment, Apply a pea-size amount to anal area 3 times a day, Disp: 100 g, Rfl: 0    omalizumab (XOLAIR) 300 mg/2 mL auto-injector, Inject the contents of 1 pen (300 mg) under the skin every fourteen (14) days., Disp: 4 mL, Rfl: 11    omalizumab 75 mg/0.5 mL AtIn, Inject the contents of 1 pen (75 mg) under the skin every  fourteen (14) days., Disp: 1 mL, Rfl: 11    pantoprazole (PROTONIX) 40 MG tablet, Take 1 tablet (40 mg total) by mouth daily., Disp: 90 tablet, Rfl: 3    propranolol (INDERAL) 10 MG tablet, Take 1 tablet (10 mg total) by mouth two (2) times a day., Disp: 60 tablet, Rfl: 11    prucalopride (MOTEGRITY) 2 mg Tab, Take 1 tablet (2 mg total) by mouth daily., Disp: 90 tablet, Rfl: 3    safety needles (BD ECLIPSE) 25 Argus x 1 Ndle, use for testosterone, Disp: 12 each, Rfl: 0    safety needles (BD SAFETYGLIDE NEEDLE) 18 Sal x 1 1/2 Ndle, For drawing hormone injection, Disp: 25 each, Rfl: 0    syringe, disposable, (EASY TOUCH LUER LOCK SYRINGE) 1 mL Syrg, Use for weekly hormone injection., Disp: 4 each, Rfl: 0    syringe, disposable, (EASY TOUCH LUER LOCK SYRINGE) 1 mL Syrg, Use for testosterone, Disp: 12 each, Rfl: 0    syringe, disposable, 2.5 mL Syrg, Use weekly, Disp: 30 Syringe, Rfl: 2    triamcinolone (KENALOG) 0.1 % cream, Apply topically Two (2) times a day., Disp: 15 g, Rfl: 0    testosterone cypionate (DEPOTESTOTERONE CYPIONATE) 200 mg/mL injection, Inject 0.2 mL (40 mg total) into the muscle once a week., Disp: 12 mL, Rfl: 0    Current Facility-Administered Medications:     omalizumab [name]) injection 300 mg, 300 mg, Subcutaneous, Q28 Days, [name], [name], [name], 300 mg at 05/24/22 1138  Assessment & Plan  Transgender person on hormone therapy  Vincent Waters is a transgender man on HRT for gender-affirming care. Pt has been doing well on current dose. No significant physical or emotional changes. No new medical concerns. Pt denies chest pain, shortness of breath, dizziness, lightheadedness, vision changes, or any other concerning symptoms. No active CAD, history of polycythemia vera, or hormone sensitive tumors. After discussion of risks/benefits, pt would like to continue current management. Will collect monitoring labs today per above. 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 including a marked incongruence between one???s experienced/expressed gender and primary and/or secondary sex characteristics; a strong desire to be rid of one???s primary and/or secondary sex characteristics because of a marked incongruence with one???s experienced/expressed gender; a strong desire for the primary and/or secondary sex characteristics of the other gender; a strong desire to be of the other gender (or some alternative gender different from one???s assigned gender); a strong desire to be treated as the other gender (or some alternative gender different from one???s assigned gender); and/or a strong conviction that one has the typical feelings and reactions of the other gender (or some alternative gender different from one???s assigned gender), and thus this treatment offers significant benefit for their physical and mental health.   - Continue HRT per above  - Labs today: testosterone, hematocrit  - PrEP eligibility: yes, see below  - Pregnancy risk assessment: s/p hysterectomy    Orders:    testosterone cypionate (DEPOTESTOTERONE CYPIONATE) 200 mg/mL injection; Inject 0.2 mL (40 mg total) into the muscle once a week.    Hematocrit; Future    Testosterone; Future    Low back pain radiating to right lower extremity  # Palpable mass of lower back  Continues to experience R lower back pain and a palpable ~2 cm knot lateral to his lumbar spine on right side, which we discussed may be a muscle spasm vs lipoma or other mass. Dry needling previously  helped but only temporarily. Avoiding muscle relaxants due to concern for sedation with other meds. After discussion of eval/management options, pt would like to proceed with referral for PT (currently seeing PT but needs new referral to continue seeing them) as well as soft tissue US to characterize mass.  Orders:    Ambulatory referral to Physical Therapy; Future    US Soft Tissue Abdomen; Future    Bipolar I disorder (CMS-HCC)  No recent manic or hypomanic activity. Working on establishing with therapist. Previously worked with Dr. Matilde Haymaker at [facility], will ask if this is still possible. Also provided list of therapists in AVS. Continues to strongly resist mood stabilizers given negative experiences in the past. May be open to psych consult in the future, will readdress at follow-up visit.   - Attempt to re-establish therapy with Dr. Matilde Haymaker  - List of therapists provided in AVS  - Consider psych consult in the future given med history       Mild intermittent asthma without complication  Using symbicort BID scheduled, roughly 2 extra doses per week, mostly for congestion; denies shortness of breath or wheezing since starting xolair.   - Continue current management   - Continue following with allergy       On pre-exposure prophylaxis for HIV  Patient is sexually active trans male with male partners (some male partners previously). Estimated number of partners in past 3 months: 6. Condom-less sexual encounters since last visit: many. Known exposure to HIV: no; had one encounter with a person who stated they were undetectable over 3 months ago. Known exposure to other STIs: no. IV drug use: no. Condom use: inconsistent. During sex, the following body parts contact a penis: hands, mouth, genitals. Denies fever, fatigue, skin rash, pharyngitis, cervical lymphadenopathy, or any other symptoms concerning for acute HIV. Denies throat pain, urethral discharge, or any other symptoms concerning for acute GC/CT infection. HEENT exam without pharyngeal erythema, tonsillar edema or exudate, palpably enlarged LN, or rash on exposed skin. Based on this information, they are at high risk for contracting HIV and are a good candidate for pre-exposure prophylaxis against HIV. This is a preventive medicine service and E/M modifier 33 should apply to all related testing and prescriptions. The lab tests listed below are required to ensure the safety of continuing pre-exposure prophylaxis (PrEP) against HIV.  - Labs drawn today: HIV, Cr, RPR, GC/CT (urine, throat; declines anal swab)  - Repeat monitoring labs in 3 months  - Provided adherence and risk reduction / safer sex counseling  - PrEP refill provided today   Orders:    Chlamydia/Gonorrhoeae NAA    Chlamydia/Gonorrhoeae NAA; Future    HIV Antigen/Antibody Combo; Future    Syphilis Screen; Future    Hemorrhoids, unspecified hemorrhoid type  Continuing to experience hemorrhoids, requests colorectal surgery referral.   Orders:    Ambulatory referral to Colorectal Surgery; Future      HEALTH MAINTENANCE ITEMS STILL DUE:  There are no preventive care reminders to display for this patient.  Follow-up: No follow-ups on file.    Future Appointments   Date Time Provider Department Center   08/26/2023  8:45 AM IC MRI MBL 1 I[facility] [place] - IC   08/28/2023  8:30 AM Stem, Jossie Ng, MD SURGI1UMH [facility]   08/29/2023  1:30 PM IC [place] RM 1 I[facility] [place] - IC   08/30/2023 11:30 AM Mahoro, Champ Mungo, MD UNCGIMEDET [facility]   09/01/2023 11:45 AM Myer, Sharyn Creamer, PT PTOT [facility]  09/06/2023  1:00 PM Myer, Sharyn Creamer, PT PT[facility]   09/13/2023  1:00 PM Seymour Bars, PT [facility]   09/18/2023 11:30 AM Caid, Letha Cape, MD [facility]   09/21/2023 12:15 PM Seymour Bars, PT [facility]   09/28/2023 12:15 PM Seymour Bars, PT [facility]   10/05/2023 12:15 PM Seymour Bars, PT [facility]   10/12/2023 12:15 PM Seymour Bars, PT [facility]   10/17/2023  9:00 AM Galen Daft, FNP ANESPAINMRKT [facility]   10/19/2023 11:00 AM Rayala, Kandis Cocking, MD [facility]   10/24/2023 10:00 AM Lady Gary, FNP [facility]   11/09/2023 10:30 AM Addison Naegeli, PMHNP OPTCVilcom [facility]   11/16/2023  9:00 AM Thora Lance, MD HVVSURMMNT [facility]   01/04/2024  8:45 AM Kimple, Adam [place], MD UNCOTOMEWVIL [facility]       Subjective   Vincent Waters is a 43 y.o. adult  coming to clinic today for follow-up.    Chief Complaint   Patient presents with    Follow-up     Refill of testosterone, referral to PT for back [facility]), lump in back (tried dry needling but that was way too painful... PT doesn't want to keep going over it if they don't know what it is, pain goes from front to back)     HPI: see above for more information.     I have reviewed the problem list, medications, and allergies and have updated/reconciled them if needed.    Vincent Waters  reports that he has never smoked. He has never been exposed to tobacco smoke. He has never used smokeless tobacco.  Health Maintenance   Topic Date Due    DTaP/Tdap/Td Vaccines (2 - Td or Tdap) 08/19/2027 Lipid Screening  12/26/2027    Pneumococcal Vaccine 0-49  Completed    Hepatitis C Screen  Completed    COVID-19 Vaccine  Completed    Influenza Vaccine  Completed       Objective     VITALS: BP 118/83 (BP Site: R Arm, BP Position: Sitting, BP Cuff Size: Medium)  - Pulse 77  - Temp 37.1 ??C (98.7 ??F) (Temporal)  - Ht 162.6 cm (5' 4.02)  - LMP 07/20/2017 (Exact Date)  - BMI 26.94 kg/m??     Physical Exam  General: well-appearing, sitting upright in no acute distress  Head: Normocephalic, atraumatic  ENT: No dental trauma noted.   Eyes: conjunctiva normal, non-erythematous, non-icteric, no discharge.  Neck: no thyroid enlargement or masses  Lungs: No increased work of breathing or audible wheezing  Skin: Warm, dry, no erythema or rash on exposed skin  Musculoskeletal: No visible gait abnormalities  Neurologic: Alert & oriented x 3, no gross sensorimotor abnormalities  Psychiatric: Pleasant, cooperative, good eye contact, appropriate thought processes    Wt Readings from Last 3 Encounters:   08/01/23 71.2 kg (157 lb)   07/26/23 71.2 kg (157 lb)   07/25/23 71.2 kg (157 lb)      PHQ-9 PHQ-9 TOTAL SCORE   10/22/2020   9:00 AM 6   09/04/2020   9:00 AM 3   08/21/2020   9:00 AM 5   05/22/2020   3:00 PM 9     LABS/IMAGING: I have reviewed pertinent recent labs and imaging in Epic.    Alver Fisher, MD, MPH (he/him)  [facility]  [address]   [place], [place]  [address]  Phone: [phone]  Fax: [phone] instrumental in caring for this patient today including caring for their chronic medical and psychiatric conditions.      Alver Fisher, MD, MPH (he/him)  [facility]  [address]   [place], [place] [address]  Phone: [phone]  Fax: [phone]

## 2023-08-23 NOTE — Unmapped (Addendum)
 Using symbicort BID scheduled, roughly 2 extra doses per week, mostly for congestion; denies shortness of breath or wheezing since starting xolair.   - Continue current management   - Continue following with allergy

## 2023-08-23 NOTE — Unmapped (Addendum)
 Patient is sexually active trans male with male partners (some male partners previously). Estimated number of partners in past 3 months: 6. Condom-less sexual encounters since last visit: many. Known exposure to HIV: no; had one encounter with a person who stated they were undetectable over 3 months ago. Known exposure to other STIs: no. IV drug use: no. Condom use: inconsistent. During sex, the following body parts contact a penis: hands, mouth, genitals. Denies fever, fatigue, skin rash, pharyngitis, cervical lymphadenopathy, or any other symptoms concerning for acute HIV. Denies throat pain, urethral discharge, or any other symptoms concerning for acute GC/CT infection. HEENT exam without pharyngeal erythema, tonsillar edema or exudate, palpably enlarged LN, or rash on exposed skin. Based on this information, they are at high risk for contracting HIV and are a good candidate for pre-exposure prophylaxis against HIV. This is a preventive medicine service and E/M modifier 33 should apply to all related testing and prescriptions. The lab tests listed below are required to ensure the safety of continuing pre-exposure prophylaxis (PrEP) against HIV.  - Labs drawn today: HIV, Cr, RPR, GC/CT (urine, throat; declines anal swab)  - Repeat monitoring labs in 3 months  - Provided adherence and risk reduction / safer sex counseling  - PrEP refill provided today   Orders:    Chlamydia/Gonorrhoeae NAA    Chlamydia/Gonorrhoeae NAA; Future    HIV Antigen/Antibody Combo; Future    Syphilis Screen; Future

## 2023-08-24 NOTE — Unmapped (Signed)
 [facility]AP[facility] SERVICES AT [place]  OUTPATIENT PHYSICAL THERAPY  08/24/2023  Note Type: Evaluation       Patient Name: Vincent Waters  Date of Birth:19-Jun-1981  Diagnosis:   Encounter Diagnosis   Name Primary?    Chronic right-sided low back pain without sciatica Yes     Referring MD:  Dawna Part, *     Date of Onset of Impairment-08/23/2016  Date PT Care Plan Established or Reviewed-08/24/2023  Date PT Treatment Started-08/24/2023   Visit Count: 1  Plan of Care Effective Date:          Assessment/Plan:    Assessment  Assessment details:    43 y.o. adult who presents to physical therapy with signs and symptoms consistent with low back pain, which is limiting his ability to perform daily functional activities such as sitting, lifting and sleeping.  It appears that Vincent Waters's symptoms are consistent with right hip/trunk/core weakness that could be related to his right leg fractures when he was younger.  I suspect that his radiating tingles are related to lumbar/hip instability that is putting tension on his right lower extremity nerves.  We discussed our plan (calm lumbar musculature and improve right core/hip strength) and he agrees with this plan.  Recommended that he start with some posterior pelvic tilts and he agreed to do this.  All questions answered.  This patient requires skilled physical therapy services to address the outlined impairments in order to return to his desired level of function.           Impairments: pain, muscular restrictions, hypertonicity, core weakness, impaired ADLs, joint restriction, decreased strength and gait deviation      Personal Factors/Comorbidities: 1-2    Specific Comorbidities: Anxiety, Depression    Examination of Body Systems: musculoskeletal and neurological        Prognosis: good prognosis    Positive Prognosis Rationale: age, behavior, motivated for treatment, insight, severity of symptoms, strength and language.  Negative Prognosis Rationale: financial status, Pain Status and chronicity of condition.      Therapy Goals      Goals:      Goals:  In 12 weeks:  Goal #1: Patient will be independent with progressively monitored home exercise program.   Baseline/Current: Patient is unfamiliar with home exercise program.     Goal #2: Patient will demonstrate lumbar ROM within normal limits without pain in order to perform daily functional activities such as bending to pick an object off the ground, twisting to reach into the patient's backseat or bend backwards in order to lift an object overhead.   Baseline/Current: Patient has painful lumbar ROM (see ROM testing).     Goal #3: Patient will demonstrate right hip strength within normal limits without pain in order to allow for functional tasks such as symmetrical walking, squatting and negotiating stairs.   Baseline/Current: Patient has limited right hip strength (see manual muscle testing).      Goal #4: Patient will be able to lift at least 75 lb from floor to waist with good lifting mechanics in order to demonstrate good core and trunk strength.   Baseline/Current: Patient has pain and difficulty lifting at present.    Goal #5: Patient will report improvement in symptoms while sitting and ability to sit up to 1 hour with minimal symptoms.  Baseline/Current: Patient has difficulty sitting at present.    Goal #6: Patient's FOTO score will improve from 34 at initial evaluation to >/= 53 at discharge in  order to demonstrate a significant improvement in self-reported outcome measures that directly result from his course of physical therapy.   Baseline/Current: FOTO = 34    Plan    Therapy options: will be seen for skilled physical therapy services    Planned therapy interventions: 20560, 20561-Dry Needling 1-2, 3+ areas, 97010-Cold Packs/Hot Packs, 97012-Mechanical Traction, 97032, G0283-Electrical Stimulation (unattended, attended), 97110-Therapeutic Exercises, 97112-Neuromuscular Re-education, 97116-Gait Training, 97140-Manual Therapy, 97164-Re-evaluation (PT) and 97530-Therapeutic Activities      Frequency: 1x week    Duration in weeks: 12    Education provided to: patient.    Education provided: Surveyor, minerals, HEP and Treatment options and plan    Education results: verbalized good understanding, needs reinforcement, demonstrates understanding and needs further instruction.    Communication/Consultation: N/A.    Next visit plan:        Manual therapy/TDN to calm lumbar musculature, right hip/core/trunk strengthening.    Total Session Time: 45    Treatment rendered today:      PT Evaluation: 30 minutes    Therapeutic Exercise: 15 minutes  - Patient Education: Examination Findings, Plan of Care, Home Exercise Program, Body Mechanics, and Posture  - Supine Posterior Pelvic Tilts         Subjective:     History of Present Condition     Date of onset:  08/23/2016    Date is approximation?: yes    Explanation:  Patient reports onset of symptoms in 2018.    History of Present Condition/Chief Complaint:  Vincent Waters is a 43 y.o. right hand dominant adult (male to male transition in process) who presents to outpatient physical therapy with chronic low back pressure, tightness and pain that began in 2018.  Patient cannot remember any specific traumatic mechanism of injury that may have been the result of his symptoms, but he does report that he broke his right leg 2x when he was younger.  The first one was when he was a baby.  The second time he broke it falling off the couch when he was 8.  He can't remember if it was his femur or tibia and he hasn't had a recent x-ray.  Subjective:  Vincent Waters reports pain and difficulty with sitting, lifting, and sleeping.  He reports relief with rest, activity modification, heat, and changes in position.  Vincent Waters denies any bowel/bladder/sexual dysfunction, nausea, nystagmus, dysphagia, dysarthria, dizziness, diplopia, diaphoresis or drop attacks at this time.  He does report some numbness on the lateral aspect of his left thigh.  He also reports some tingling that originates in his right low back, radiating down the anterior aspect of his right hip, thigh and shin to the dorsum of his right foot.  He does report some occasional dizziness, but he feels that is related to his current headaches.  Pain:     Current pain rating:  5    At best pain rating:  0    At worst pain rating:  8  Location:  Right low back and radiating down the anterior aspect of his right lower extremity    Quality:  Tingling, aching and throbbing    Relieving factors:  Rest and medications    Aggravating factors:  Performance of leg dominant activites, sitting and lifting    Pain related Behaviors:  Avoidance    Progression:  No change    Red Flags:  Disturbed sleep  Precautions/Equipment   Precautions:  None    Current Braces/Orthoses:  None  Equipment Currently Used:  None  Prior Functional Status     Physical limitation(s):  Chronic of low back pain, pressure and tightness that has made sitting, lifting and sleeping difficult  Current Functional Status:    disturbed sleep, limited exercise, limited lifting, limited household activities and limited sitting tolerance  Social Support:     Lives Environment:  One-story house    Lives with:  Other (roommate)    Hand dominance:  Right  Barriers to Learning:  No Barriers    Work/School:  [employer]  Diagnostic Tests:     X-ray: abnormal (Lumbar X-Ray 10/20/2022: 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  Lumbar Spine-Increased lordosis.   Static Posture Comments-Patient has slightly lowered arch left > right.    Muscle Activation   Patient unable to activate right transverse abdominals and right multifidus.     Ambulation     Ambulation Comments-Patient is ambulating with increased right lower extremity internal rotation (appears like knee varus) and trunk lean to contralateral side (left).    Palpation     Right Lumbar   Hypertonic in the lumbar paraspinals.  Tenderness of the lumbar paraspinals.    Range of Motion  Hip  Active ROM  Right Hip-Active ROM  Normal active range of motion  Passive ROM  Right Hip-Passive ROM  Normal passive range of motion  Lumbar  Active ROM  Lumbar-Active ROM   Normal active range of motion  Passive ROM  Lumbar-Passive ROM   Normal passive range of motion  Knee  Active ROM  Right Knee-Active ROM  Normal active range of motion  Passive ROM  Right Knee-Passive ROM   Normal passive range of motion  Ankle/Foot  Active ROM  Right Ankle/Foot-Active ROM  Normal active range of motion  Passive ROM  Right Ankle/Foot-Passive ROM  Normal passive range of motion      No pain elicited with over pressure into any of the joint's movements. , No pain elicited with over pressure into any of the joint's movements. , No pain elicited with over pressure into any of the joint's movements EXCEPT for lumbar extension, which is concordant. and No pain elicited with over pressure into any of the joint's movements.     Joint Mobility  Lumbar  Lumbar Joint Mobility   Lumbar Joints that are WFL are L1, L2, L3 and L4.   Hypomobile at L5.   Pain at L2 and L5.     Strength  Left Hip   Planes of Motion   Flexion: 5  Extension: 4  Abduction: 5  External rotation: 5  Internal rotation: 5  Right Hip   Planes of Motion   Flexion: 4  Extension: 5  Abduction: 4-  External rotation: 4+  Internal rotation: 5  Right Knee   Flexion: 5  Extension: 4+  Additional Strength Details   Patient has bilaterally symmetrical and normal lower myotomes with no obvious sign of any neurological weakness. ,      Tests Lumbar   Left   Negative crossed SLR and passive SLR.   Right  Positive passive SLR.   Negative crossed SLR.   Additional Tests Details:     PSLR:   Right: 95 degrees   Left: 95 degrees                                   I attest that I have reviewed the above information.  Signed: Patton Salles, PT  08/24/2023 3:55 PM

## 2023-08-26 ENCOUNTER — Inpatient Hospital Stay: Admit: 2023-08-26 | Discharge: 2023-08-27 | Payer: BLUE CROSS/BLUE SHIELD

## 2023-08-26 MED ADMIN — gadopiclenol (ELUCIREM,VUEWAY) injection 7.12 mL: .1 mL/kg | INTRAVENOUS | @ 14:00:00 | Stop: 2023-08-26

## 2023-08-29 ENCOUNTER — Inpatient Hospital Stay: Admit: 2023-08-29 | Discharge: 2023-08-30 | Payer: BLUE CROSS/BLUE SHIELD

## 2023-08-29 DIAGNOSIS — F640 Transsexualism: Principal | ICD-10-CM

## 2023-08-29 DIAGNOSIS — M545 Low back pain radiating to right lower extremity: Principal | ICD-10-CM

## 2023-08-29 DIAGNOSIS — K649 Unspecified hemorrhoids: Principal | ICD-10-CM

## 2023-08-29 DIAGNOSIS — J4520 Mild intermittent asthma, uncomplicated: Principal | ICD-10-CM

## 2023-08-29 DIAGNOSIS — M79604 Pain in right leg: Principal | ICD-10-CM

## 2023-08-29 DIAGNOSIS — Z79899 Other long term (current) drug therapy: Principal | ICD-10-CM

## 2023-08-29 DIAGNOSIS — R222 Localized swelling, mass and lump, trunk: Principal | ICD-10-CM

## 2023-08-29 DIAGNOSIS — F319 Bipolar disorder, unspecified: Principal | ICD-10-CM

## 2023-08-29 NOTE — Unmapped (Signed)
 [facility] Clinical Assessment & Refill Coordination Note    Vincent Waters Waters, [place]: [birth year]  Phone: [phone] (home) [phone] (work)    All above HIPAA information was verified with patient.     Was a Nurse, learning disability used for this call? No    Specialty Medication(s):   CF/Pulmonary/Asthma: Xolair     Current Outpatient Medications   Medication Sig Dispense Refill    albuterol 2.5 mg /3 mL (0.083 %) nebulizer solution Inhale 3 mL (2.5 mg total) by nebulization every six (6) hours as needed for wheezing or shortness of breath. 90 mL 1    azelastine (ASTELIN) 137 mcg (0.1 %) nasal spray Instill 2 sprays into each nostril two (2) times a day as needed for rhinitis. 30 mL 1    budesonide-formoterol (SYMBICORT) 160-4.5 mcg/actuation inhaler Inhale 1 puff by mouth Two (2) times a day. 10.2 g 5    cetirizine (ZYRTEC) 10 MG tablet Take 1 tablet (10 mg total) by mouth daily. 30 tablet 12    clonazePAM (KLONOPIN) 0.5 MG tablet Take 0.5 tablets (0.25 mg total) by mouth two (2) times a day as needed for anxiety. 30 tablet 5    dextroamphetamine-amphetamine (ADDERALL) 20 mg tablet Take 1 tablet (20 mg total) by mouth daily. 30 tablet 0    [START ON 09/17/2023] dextroamphetamine-amphetamine (ADDERALL) 20 mg tablet Take 1 tablet (20 mg total) by mouth daily. 30 tablet 0    dextroamphetamine-amphetamine (ADDERALL) 20 mg tablet Take 1 tablet (20 mg total) by mouth daily. 30 tablet 0    [START ON 09/16/2023] dextroamphetamine-amphetamine (ADDERALL) 20 mg tablet Take 1 tablet (20 mg total) by mouth daily. 30 tablet 0    [START ON 10/16/2023] dextroamphetamine-amphetamine (ADDERALL) 20 mg tablet Take 1 tablet (20 mg total) by mouth daily. 30 tablet 0    diclofenac sodium (VOLTAREN) 1 % gel Apply 2 g topically four (4) times a day. 100 g 0    emtricitabine-tenofovir alafen (DESCOVY) 200-25 mg tablet Take 1 tablet by mouth daily. 90 tablet 0    EPINEPHrine (EPIPEN) 0.3 mg/0.3 mL injection Inject 0.3 mL (0.3 mg total) under the skin once for 1 dose. 2 each 0    gabapentin (NEURONTIN) 250 mg/5 mL oral solution Take 12 mL (600 mg total) by mouth Three (3) times a day. 1080 mL 2    inhalational spacing device Spcr Use as directed with albuterol and symbicort 1 each 1    ipratropium (ATROVENT) 42 mcg (0.06 %) nasal spray 1-2 sprays in each nostril as needed for drainage, up to 4 time daily. 15 mL 2    metoclopramide (REGLAN) 10 MG tablet Take 1/2 tablet (5 mg total) by mouth Three (3) times a day with a meal. 135 tablet 3    nebulizer and compressor (COMP-AIR NEBULIZER COMPRESSOR) Devi 1 each by Miscellaneous route nightly as needed. 1 each 0    needle, disp, 25 Vincent Waters (BD REGULAR BEVEL NEEDLES) 25 [place] x 5/8 Ndle For subcutaneous hormone injection. 25 each 0    nifedipine 0.3% lidocaine 1.5% in petrolatum ointment Apply a pea-size amount to anal area 3 times a day 100 g 0    omalizumab (XOLAIR) 300 mg/2 mL auto-injector Inject the contents of 1 pen (300 mg) under the skin every fourteen (14) days. 4 mL 11    omalizumab 75 mg/0.5 mL AtIn Inject the contents of 1 pen (75 mg) under the skin every fourteen (14) days. 1 mL 11    pantoprazole (  PROTONIX) 40 MG tablet Take 1 tablet (40 mg total) by mouth daily. 90 tablet 3    propranolol (INDERAL) 10 MG tablet Take 1 tablet (10 mg total) by mouth two (2) times a day. 60 tablet 11    prucalopride (MOTEGRITY) 2 mg Tab Take 1 tablet (2 mg total) by mouth daily. 90 tablet 3    safety needles (BD ECLIPSE) 25 Garlen x 1 Ndle use for testosterone 12 each 0    safety needles (BD SAFETYGLIDE NEEDLE) 18 Josha x 1 1/2 Ndle For drawing hormone injection 25 each 0    syringe, disposable, (EASY TOUCH LUER LOCK SYRINGE) 1 mL Syrg Use for weekly hormone injection. 4 each 0    syringe, disposable, (EASY TOUCH LUER LOCK SYRINGE) 1 mL Syrg Use for testosterone 12 each 0    syringe, disposable, 2.5 mL Syrg Use weekly 30 Syringe 2    testosterone cypionate (DEPOTESTOTERONE CYPIONATE) 200 mg/mL injection Inject 0.2 mL (40 mg total) into the muscle once a week. 12 mL 0    triamcinolone (KENALOG) 0.1 % cream Apply topically Two (2) times a day. 15 g 0     Current Facility-Administered Medications   Medication Dose Route Frequency Provider Last Rate Last Admin    omalizumab Vincent Waters Waters) injection 300 mg  300 mg Subcutaneous Q28 Days Vincent Waters Waters, Vincent Waters Waters, [name]   300 mg at 05/24/22 1138        Waters to medications: Vincent Waters Waters at this time.    Medication list has been reviewed and updated in Epic: Yes    Allergies   Allergen Reactions    Latex Rash and Hives    Penicillins Anaphylaxis     Pt can tolerate amoxicillin   Has patient had a PCN reaction causing immediate rash, facial/tongue/throat swelling, SOB or lightheadedness with hypotension: no   Has patient had a PCN reaction causing severe rash involving mucus membranes or skin necrosis: no  Has patient had a PCN reaction that required hospitalization: unknown  Has patient had a PCN reaction occurring within the last 10 years: no  If all of the above answers are NO, then may proceed with Cephalosporin use.    Shellfish Containing Products Anaphylaxis    Other Rash     Nifedipine 0.3%, Lidocaine 1.5% in petrolatum ointment caused rash on his Bottom    Trileptal [Oxcarbazepine] Rash       Waters to allergies: No    Allergies have been reviewed and updated in Epic: Yes    SPECIALTY MEDICATION ADHERENCE     Xolair 300mg /35mL  : 0 doses of medicine on hand   Xolair 75mg /0.23mL : 1 doses of medicine on hand     Medication Adherence    Patient reported X missed doses in the last month: 0  Specialty Medication: Xolair 300mg /46mL  Patient is on additional specialty medications: Yes  Additional Specialty Medications: Xolair 75mg /0.45mL  Informant: patient  Confirmed plan for next specialty medication refill: delivery by pharmacy  Refills needed for supportive medications: not needed          Specialty medication(s) dose(s) confirmed: Regimen is correct and unchanged.     Are there any concerns with adherence? No    Adherence counseling provided? Not needed    CLINICAL MANAGEMENT AND INTERVENTION      Clinical Benefit Assessment:    Do you feel the medicine is effective or helping your condition? Yes    Clinical Benefit counseling provided? Not needed    Adverse  Effects Assessment:    Are you experiencing any side effects? No    Are you experiencing difficulty administering your medicine? No    Quality of Life Assessment:    Quality of Life    Rheumatology  Oncology  Dermatology  Cystic Fibrosis          How many days over the past month did your condition  keep you from your normal activities? For example, brushing your teeth or getting up in the morning. 0    Have you discussed this with your provider? Not needed    Acute Infection Status:    Acute infections noted within Epic:  No active infections    Patient reported infection: None    Therapy Appropriateness:    Is therapy appropriate based on current medication list, adverse reactions, adherence, clinical benefit and progress toward achieving therapeutic goals? Yes, therapy is appropriate and should be continued     Clinical Intervention:    Was an intervention completed as part of this clinical assessment? No    DISEASE/MEDICATION-SPECIFIC INFORMATION      For patients on injectable medications: Patient currently has 0 doses left.  Next injection is scheduled for 09/08/2023.    Asthma/COPD: Have you had an asthma exacerbation in the last 30 days? No  Have you needed to use your rescue inhaler more often than usual in the last 30 days? No  Have you needed to take steroids for your asthma in the last 30 days? No    PATIENT SPECIFIC NEEDS     Does the patient have any physical, cognitive, or cultural barriers? No    Is the patient high risk? No    Does the patient require physician intervention or other additional services (i.e., nutrition, smoking cessation, social work)? No    Does the patient have an additional or emergency contact listed in their chart? Yes    SOCIAL DETERMINANTS OF HEALTH     At the [facility], we have learned that life circumstances - like trouble affording food, housing, utilities, or transportation can affect the health of many of our patients.   That is why we wanted to ask: are you currently experiencing any life circumstances that are negatively impacting your health and/or quality of life? No    Social Drivers of Health     Food Insecurity: Patient Declined (08/23/2023)    Hunger Vital Sign     Worried About Running Out of Food in the Last Year: Patient declined     Ran Out of Food in the Last Year: Patient declined   Tobacco Use: Low Risk  (08/24/2023)    Patient History     Smoking Tobacco Use: Never     Smokeless Tobacco Use: Never     Passive Exposure: Never   Transportation Needs: Patient Declined (08/23/2023)    PRAPARE - Transportation     Lack of Transportation (Medical): Patient declined     Lack of Transportation (Non-Medical): Patient declined   Alcohol Use: Not At Risk (07/27/2021)    Alcohol Use     How often do you have a drink containing alcohol?: Never     How many drinks containing alcohol do you have on a typical day when you are drinking?: Not on file     How often do you have 5 or more drinks on one occasion?: Never   Housing: Patient Declined (08/23/2023)    Housing     Within the past 12 months, have you ever  stayed: outside, in a car, in a tent, in an overnight shelter, or temporarily in someone else's home (i.e. couch-surfing)?: Patient refused     Are you worried about losing your housing?: Patient refused   Physical Activity: Unknown (07/27/2021)    Exercise Vital Sign     Days of Exercise per Week: 1 day     Minutes of Exercise per Session: Not on file   Utilities: Patient Declined (08/23/2023)    Utilities     Within the past 12 months, have you been unable to get utilities (heat, electricity) when it was really needed?: Patient refused   Stress: Stress Concern Present (07/27/2021)    [employer] of Occupational Health - Occupational Stress Questionnaire     Feeling of Stress : To some extent   Interpersonal Safety: Not on file   Substance Use: Not on file (04/26/2023)   Intimate Partner Violence: Not At Risk (07/27/2021)    Humiliation, Afraid, Rape, and Kick questionnaire     Fear of Current or Ex-Partner: No     Emotionally Abused: No     Physically Abused: No     Sexually Abused: No   Social Connections: Moderately Isolated (07/27/2021)    Social Connection and Isolation Panel [NHANES]     Frequency of Communication with Friends and Family: More than three times a week     Frequency of Social Gatherings with Friends and Family: More than three times a week     Attends Religious Services: More than 4 times per year     Active Member of [employer] or Organizations: No     Attends Banker Meetings: Never     Marital Status: Never married   Physicist, medical Strain: Patient Declined (08/23/2023)    Overall Financial Resource Strain (CARDIA)     Difficulty of Paying Living Expenses: Patient declined   Depression: Not at risk (11/25/2022)    PHQ-2     PHQ-2 Score: 0   Internet Connectivity: Internet connectivity concern unknown-patient declined (08/23/2023)    Internet Connectivity     Do you have access to internet services: Patient Declined     How do you connect to the internet: Not on file     Is your internet connection strong enough for you to watch video on your device without major problems?: Not on file     Do you have enough data to get through the month?: Not on file     Does at least one of the devices have a camera that you can use for video chat?: Not on file   Health Literacy: Medium Risk (07/27/2021)    Health Literacy     : Rarely       Would you be willing to receive help with any of the needs that you have identified today? No       SHIPPING     Specialty Medication(s) to be Shipped:   CF/Pulmonary/Asthma: Xolair    Other medication(s) to be shipped: No additional medications requested for fill at this time     Waters to insurance: No    Cost and Payment: Patient has a $0 copay, payment information is not required.    Delivery Scheduled: Yes, Expected medication delivery date: 09/08/2023.     Medication will be delivered via Same Day Courier to the confirmed prescription address in [facility].    The patient will receive a drug information handout for each medication shipped and additional FDA Medication Guides as required.  Verified that patient has previously received a Conservation officer, historic buildings and a Surveyor, mining.    The patient or caregiver noted above participated in the development of this care plan and knows that they can request review of or adjustments to the care plan at any time.      All of the patient's questions and concerns have been addressed.    Elnora Morrison, PharmD   [facility] Specialty Pharmacist

## 2023-08-31 ENCOUNTER — Ambulatory Visit
Admit: 2023-08-31 | Discharge: 2023-09-01 | Payer: BLUE CROSS/BLUE SHIELD | Attending: Student in an Organized Health Care Education/Training Program | Primary: Student in an Organized Health Care Education/Training Program

## 2023-08-31 DIAGNOSIS — M545 Low back pain radiating to right lower extremity: Principal | ICD-10-CM

## 2023-08-31 DIAGNOSIS — G4730 Sleep apnea, unspecified: Principal | ICD-10-CM

## 2023-08-31 DIAGNOSIS — M79604 Pain in right leg: Principal | ICD-10-CM

## 2023-08-31 DIAGNOSIS — Z79899 Other long term (current) drug therapy: Principal | ICD-10-CM

## 2023-08-31 DIAGNOSIS — S73191D Other sprain of right hip, subsequent encounter: Principal | ICD-10-CM

## 2023-08-31 NOTE — Unmapped (Deleted)
 Orders:    Chlamydia/Gonorrhoeae NAA

## 2023-08-31 NOTE — Unmapped (Signed)
 Hi Vincent Waters,    It was great to see*** you today. I look forward to seeing you at your next visit. In the meantime, please feel free to message me on mychart with any non-urgent questions or concerns (for example, refill requests for a long-term medication, or inquiries regarding vaccine availability). I do my best to respond to messages within 2 business days, however occasionally it may take longer depending on my other clinical duties.     For time-sensitive and/or new clinical concerns (including evaluation of new symptoms, medication adjustments, time-sensitive referral requests, or completion of disability or pre-employment paperwork), please call [phone] to request a phone, video, or in-person visit with me or one of my colleagues, so that we may address your concern as safely and expediently as possible.     Additionally, [facility] has an Urgent Care at our [facility] location! Call [phone] for same-day appointments, available Monday-Friday 7am-8pm; Saturday and Sunday 12pm-5pm. If you think you are having an emergency, please call 911 or go to your nearest emergency department.     If you ever need urgent help with your mental health, including for any thoughts of self-harm or suicide, please call the [place] mental health crisis line at [phone], or the national suicide prevention hotline at 210-108-5371.     Take care,    Claudie Revering, MD, MPH (he/him)  [facility] at [facility]  [address]   [place], [place] [address]  Phone: [phone]  Fax: [phone]

## 2023-08-31 NOTE — Unmapped (Signed)
 [facility]  Established Patient Clinic Note    Assessment/Plan:   Vincent Waters is a 43 y.o.adult who presents for follow-up.    - Current meds:   Current Outpatient Medications:     albuterol 2.5 mg /3 mL (0.083 %) nebulizer solution, Inhale 3 mL (2.5 mg total) by nebulization every six (6) hours as needed for wheezing or shortness of breath., Disp: 90 mL, Rfl: 1    azelastine (ASTELIN) 137 mcg (0.1 %) nasal spray, Instill 2 sprays into each nostril two (2) times a day as needed for rhinitis., Disp: 30 mL, Rfl: 1    budesonide-formoterol (SYMBICORT) 160-4.5 mcg/actuation inhaler, Inhale 1 puff by mouth Two (2) times a day., Disp: 10.2 g, Rfl: 5    cetirizine (ZYRTEC) 10 MG tablet, Take 1 tablet (10 mg total) by mouth daily., Disp: 30 tablet, Rfl: 12    clonazePAM (KLONOPIN) 0.5 MG tablet, Take 0.5 tablets (0.25 mg total) by mouth two (2) times a day as needed for anxiety., Disp: 30 tablet, Rfl: 5    dextroamphetamine-amphetamine (ADDERALL) 20 mg tablet, Take 1 tablet (20 mg total) by mouth daily., Disp: 30 tablet, Rfl: 0    [START ON 09/17/2023] dextroamphetamine-amphetamine (ADDERALL) 20 mg tablet, Take 1 tablet (20 mg total) by mouth daily., Disp: 30 tablet, Rfl: 0    dextroamphetamine-amphetamine (ADDERALL) 20 mg tablet, Take 1 tablet (20 mg total) by mouth daily., Disp: 30 tablet, Rfl: 0    [START ON 09/16/2023] dextroamphetamine-amphetamine (ADDERALL) 20 mg tablet, Take 1 tablet (20 mg total) by mouth daily., Disp: 30 tablet, Rfl: 0    [START ON 10/16/2023] dextroamphetamine-amphetamine (ADDERALL) 20 mg tablet, Take 1 tablet (20 mg total) by mouth daily., Disp: 30 tablet, Rfl: 0    diclofenac sodium (VOLTAREN) 1 % gel, Apply 2 g topically four (4) times a day., Disp: 100 g, Rfl: 0    emtricitabine-tenofovir alafen (DESCOVY) 200-25 mg tablet, Take 1 tablet by mouth daily., Disp: 90 tablet, Rfl: 0    EPINEPHrine (EPIPEN) 0.3 mg/0.3 mL injection, Inject 0.3 mL (0.3 mg total) under the skin once for 1 dose., Disp: 2 each, Rfl: 0    gabapentin (NEURONTIN) 250 mg/5 mL oral solution, Take 12 mL (600 mg total) by mouth Three (3) times a day., Disp: 1080 mL, Rfl: 2    inhalational spacing device Spcr, Use as directed with albuterol and symbicort, Disp: 1 each, Rfl: 1    ipratropium (ATROVENT) 42 mcg (0.06 %) nasal spray, 1-2 sprays in each nostril as needed for drainage, up to 4 time daily., Disp: 15 mL, Rfl: 2    metoclopramide (REGLAN) 10 MG tablet, Take 1/2 tablet (5 mg total) by mouth Three (3) times a day with a meal., Disp: 135 tablet, Rfl: 3    nebulizer and compressor (COMP-AIR NEBULIZER COMPRESSOR) Devi, 1 each by Miscellaneous route nightly as needed., Disp: 1 each, Rfl: 0    needle, disp, 25 Dontarious (BD REGULAR BEVEL NEEDLES) 25 Ruger x 5/8 Ndle, For subcutaneous hormone injection., Disp: 25 each, Rfl: 0    nifedipine 0.3% lidocaine 1.5% in petrolatum ointment, Apply a pea-size amount to anal area 3 times a day, Disp: 100 g, Rfl: 0    omalizumab (XOLAIR) 300 mg/2 mL auto-injector, Inject the contents of 1 pen (300 mg) under the skin every fourteen (14) days., Disp: 4 mL, Rfl: 11    omalizumab 75 mg/0.5 mL AtIn, Inject the contents of 1 pen (75 mg) under the skin every  fourteen (14) days., Disp: 1 mL, Rfl: 11    pantoprazole (PROTONIX) 40 MG tablet, Take 1 tablet (40 mg total) by mouth daily., Disp: 90 tablet, Rfl: 3    propranolol (INDERAL) 10 MG tablet, Take 1 tablet (10 mg total) by mouth two (2) times a day., Disp: 60 tablet, Rfl: 11    prucalopride (MOTEGRITY) 2 mg Tab, Take 1 tablet (2 mg total) by mouth daily., Disp: 90 tablet, Rfl: 3    safety needles (BD ECLIPSE) 25 Summer x 1 Ndle, use for testosterone, Disp: 12 each, Rfl: 0    safety needles (BD SAFETYGLIDE NEEDLE) 18 Prestyn x 1 1/2 Ndle, For drawing hormone injection, Disp: 25 each, Rfl: 0    syringe, disposable, (EASY TOUCH LUER LOCK SYRINGE) 1 mL Syrg, Use for weekly hormone injection., Disp: 4 each, Rfl: 0    syringe, disposable, (EASY TOUCH LUER LOCK SYRINGE) 1 mL Syrg, Use for testosterone, Disp: 12 each, Rfl: 0    syringe, disposable, 2.5 mL Syrg, Use weekly, Disp: 30 Syringe, Rfl: 2    testosterone cypionate (DEPOTESTOTERONE CYPIONATE) 200 mg/mL injection, Inject 0.2 mL (40 mg total) into the muscle once a week., Disp: 12 mL, Rfl: 0    triamcinolone (KENALOG) 0.1 % cream, Apply topically Two (2) times a day., Disp: 15 g, Rfl: 0    Current Facility-Administered Medications:     omalizumab [name]) injection 300 mg, 300 mg, Subcutaneous, Q28 Days, [name], [name], [name], 300 mg at 05/24/22 1138  Assessment & Plan  Low back pain radiating to right lower extremity  R lower back pain radiating around to RLQ. Has pinched nerve on L side but not on R side to his knowledge. March 2024 MRI hip demonstrated suspected right acetabular labral tear, consider MR arthrography for further evaluation. Discussed that his current pain may be 2/2 labral tear which he agrees with. Hasn't had dedicated PT for hip in several months.   Exam notable for reproduction of RLQ and R lower back pain with hip flexion and external/internal rotation, suggesting labral tear as potential etiology. Reviewed US findings which demonstrated prominent fat globules overlying iliac crest but no evidence of superficial mass lesion or fluid collection or muscle tear. After discussion of eval/management options, pt would like to return to dedicated R hip PT, and if no improvement over 2-3 months will proceed with MRI R hip and ortho referral to discuss possible repair of labral tear.     Orders:    Ambulatory referral to Physical Therapy; Future    MRI Lower Extremity Joint Right W Wo Contrast; Future    Sleep apnea, unspecified type  Pt has CPAP but has not been wearing it due to issues with mask. After discussion of eval/management options, pt would like to proceed with CPAP supply order to be re-fitted for a new mask which will hopefully help with adherence. Orders:    CPAP Therapy and Supplies; Future      HEALTH MAINTENANCE ITEMS STILL DUE:  There are no preventive care reminders to display for this patient.  Follow-up: No follow-ups on file.    Future Appointments   Date Time Provider Department Center   09/01/2023 11:45 AM Seymour Bars, PT [facility]   09/06/2023 11:30 AM Mahoro, Champ Mungo, MD UNCGIMEDET [facility]   09/06/2023  1:00 PM Seymour Bars, PT [facility]   09/13/2023  1:00 PM Seymour Bars, PT [facility]   09/18/2023 11:30 AM Caid, Letha Cape, MD  UNCALLERGET [facility]   09/21/2023 12:15 PM Myer, Sharyn Creamer, PT [facility]   09/28/2023 12:15 PM Seymour Bars, PT [facility]   10/05/2023 12:15 PM Myer, Sharyn Creamer, PT [facility]   10/12/2023 12:15 PM Seymour Bars, PT [facility]   10/17/2023  9:30 AM Mebrahtu, Bennetta Laos, FNP ANESPAINMRKT [facility]   10/19/2023 11:00 AM Rayala, Kandis Cocking, MD [facility]   10/24/2023 10:00 AM Lady Gary, FNP [facility]   11/09/2023 10:30 AM Addison Naegeli, PMHNP OPTCVilcom [facility]   11/16/2023  9:00 AM Thora Lance, MD HVVSURMMNT [facility]   01/04/2024  8:45 AM Kimple, Adam [place], MD UNCOTOMEWVIL [facility]       Subjective   Jahzion is a 43 y.o. adult  coming to clinic today for follow-up.    Chief Complaint   Patient presents with    Back Pain     HPI: see above for more information.     I have reviewed the problem list, medications, and allergies and have updated/reconciled them if needed.    Son  reports that he has never smoked. He has never been exposed to tobacco smoke. He has never used smokeless tobacco.  Health Maintenance   Topic Date Due    DTaP/Tdap/Td Vaccines (2 - Td or Tdap) 08/19/2027    Lipid Screening  12/26/2027    Pneumococcal Vaccine 0-49  Completed    Hepatitis C Screen  Completed    COVID-19 Vaccine  Completed    Influenza Vaccine  Completed       Objective     VITALS: BP 112/74 (BP Position: Sitting)  - Pulse 69  - Temp 35.8 ??C (96.4 ??F) (Temporal)  - Wt 71.2 kg (157 lb)  - LMP 07/20/2017 (Exact Date)  - BMI 26.94 kg/m??     Physical Exam  General: well-appearing, sitting upright in no acute distress  Head: Normocephalic, atraumatic  ENT: No dental trauma noted.   Eyes: conjunctiva normal, non-erythematous, non-icteric, no discharge.  Neck: no thyroid enlargement or masses  Lungs: No increased work of breathing or audible wheezing  Skin: Warm, dry, no erythema or rash on exposed skin  Musculoskeletal: Reproduction of RLQ and R lower back pain with flexion and internal/external rotation at hip. Straight leg test negative.   Neurologic: Alert & oriented x 3, no gross sensorimotor abnormalities  Psychiatric: Pleasant, cooperative, good eye contact, appropriate thought processes    Wt Readings from Last 3 Encounters:   08/31/23 71.2 kg (157 lb)   08/01/23 71.2 kg (157 lb)   07/26/23 71.2 kg (157 lb)      PHQ-9 PHQ-9 Total Score   10/22/2020   9:00 AM 6   09/04/2020   9:00 AM 3   08/21/2020   9:00 AM 5   05/22/2020   3:00 PM 9     LABS/IMAGING: I have reviewed pertinent recent labs and imaging in Epic.    I personally spent a total of 30 minutes taking care of this patient including time with the patient, pre-visit planning, documentation, reviewing labs, coordinating care, and reviewing documentation from other providers in [facility] Medicine and in other specialties.  This was all done on the day of the visit.  My (and this clinic's) longitudinal relationship with this patient was instrumental in caring for this patient today including caring for their chronic medical and psychiatric conditions.      Vincent Fisher, MD, MPH (he/him)  [facility]  [facility] at [facility]  [address]   [place], [place] [address]  Phone: [phone]  Fax: [phone]

## 2023-09-01 ENCOUNTER — Ambulatory Visit
Admit: 2023-09-01 | Payer: BLUE CROSS/BLUE SHIELD | Attending: Rehabilitative and Restorative Service Providers" | Primary: Rehabilitative and Restorative Service Providers"

## 2023-09-01 ENCOUNTER — Ambulatory Visit
Admit: 2023-09-01 | Attending: Rehabilitative and Restorative Service Providers" | Primary: Rehabilitative and Restorative Service Providers"

## 2023-09-01 MED FILL — BD SAFETYGLIDE NEEDLE 18 GAUGE X 1 1/2": 84 days supply | Qty: 12 | Fill #1

## 2023-09-01 MED FILL — BD REGULAR BEVEL NEEDLES 25 GAUGE X 5/8": 84 days supply | Qty: 12 | Fill #1

## 2023-09-01 NOTE — Unmapped (Signed)
 [facility] THERAPY SERVICES AT [facility] [place]  OUTPATIENT PHYSICAL THERAPY  09/01/2023  Note Type: Treatment Note       Patient Name: Vincent Waters  Date of Birth:1980/09/28  Diagnosis:   Encounter Diagnosis   Name Primary?    Chronic right-sided low back pain without sciatica Yes     Referring MD:  Dawna Part, *     Date of Onset of Impairment-08/23/2016  Date PT Care Plan Established or Reviewed-08/24/2023  Date PT Treatment Started-08/24/2023   Visit Count: 2  Plan of Care Effective Date:          Assessment/Plan:    Assessment  Assessment details:    I still believe that Athony's issues/symptoms are related to a lack of strength and stability in his right core/trunk/hip.  Following manual therapy, he stated he felt better.  Remaining part of session focused on educating him on right hip and core strengthening.  Written instructions given for these exercises.  He committed to performing them consistently as part of his home exercise program.  Also, recommended that he get into the gym 2x/week to resume his shoulder strengthening and he agreed to do this.  All questions answered.  This patient requires skilled physical therapy services to address the outlined impairments in order to return to his desired level of function.           Impairments: pain, muscular restrictions, hypertonicity, core weakness, impaired ADLs, joint restriction, decreased strength and gait deviation                      Therapy Goals      Goals:      Goals:  In 12 weeks:  Goal #1: Patient will be independent with progressively monitored home exercise program.   Baseline/Current: Patient is unfamiliar with home exercise program.     Goal #2: Patient will demonstrate lumbar ROM within normal limits without pain in order to perform daily functional activities such as bending to pick an object off the ground, twisting to reach into the patient's backseat or bend backwards in order to lift an object overhead.   Baseline/Current: Patient has painful lumbar ROM (see ROM testing).     Goal #3: Patient will demonstrate right hip strength within normal limits without pain in order to allow for functional tasks such as symmetrical walking, squatting and negotiating stairs.   Baseline/Current: Patient has limited right hip strength (see manual muscle testing).      Goal #4: Patient will be able to lift at least 75 lb from floor to waist with good lifting mechanics in order to demonstrate good core and trunk strength.   Baseline/Current: Patient has pain and difficulty lifting at present.    Goal #5: Patient will report improvement in symptoms while sitting and ability to sit up to 1 hour with minimal symptoms.  Baseline/Current: Patient has difficulty sitting at present.    Goal #6: Patient's FOTO score will improve from 34 at initial evaluation to >/= 53 at discharge in order to demonstrate a significant improvement in self-reported outcome measures that directly result from his course of physical therapy.   Baseline/Current: FOTO = 34    Plan    Therapy options: will be seen for skilled physical therapy services    Planned therapy interventions: 20560, 20561-Dry Needling 1-2, 3+ areas, 97010-Cold Packs/Hot Packs, 97012-Mechanical Traction, 97032, G0283-Electrical Stimulation (unattended, attended), 97110-Therapeutic Exercises, 97112-Neuromuscular Re-education, 97116-Gait Training, 97140-Manual Therapy, 97164-Re-evaluation (PT) and 97530-Therapeutic Activities  Frequency: 1x week    Duration in weeks: 12    Education provided to: patient.    Education provided: Surveyor, minerals, HEP and Treatment options and plan    Education results: verbalized good understanding, needs reinforcement, demonstrates understanding and needs further instruction.    Communication/Consultation: N/A.    Next visit plan:        Manual therapy/TDN to calm lumbar musculature, right hip/core/trunk strengthening.    Total Session Time: 45    Treatment rendered today:      Therapeutic Exercise: 25 minutes  - Patient Education: Plan of Care, Home Exercise Program  - Side Lying on Pillow Roll  - Side Lying Clam With Band: 2 x [10 x 3] (L5)  - Side Lying Hip Abduction: 2 x 10  - Supine TA  - Supine TA/March: 20 x, bilateral, alternating    Manual Therapy: 20 minutes  - Soft Tissue Mobilization right lumbar musculature  - Gentle L4 - S1 CPA in prone  - Side Lying Pelvic Distraction using pillow roll, cranial to caudal  - Side Lying on Left Side lumbar Mobilization (cavitation noted)  [facility]  was informed of the risks associated with dry needling and he signed consent form.  Patient screened for any contraindications to needling including pregnancy, infection, antibiotic use, anticoagulants use, autoimmune issues, uncontrolled diabetes, and active cancer/treatment and does not have any of these conditions.  DRY NEEDLING FOR RELEASING IDENTIFIED TRIGGER POINTS TO FACILITATE MANUAL THERAPY TECHNIQUES AND ALLOW MUSCLE TO RETURN TO OPTIMAL LENGTH (NO CHARGE).  Right upper trapezius trigger point with 50mm x .30mm with Pinch/Pull Technique established for safety - Local Twitch Response produced     Right SCM trigger point with 30mm x .30mm with Pinch/Pull Technique established for safety - Local Twitch Response produced    Plan details: Access Code: [email or web address]  URL: [email or web address]  Date: 09/01/2023  Prepared by: Christianne Dolin    Exercises  - Supine Posterior Pelvic Tilt  - 1 x daily - 7 x weekly - 1 sets - 20 reps - 5 hold  - Sidelying Thoracolumbar Mobilisation with Towel Roll  - 2 x daily - 7 x weekly - 1 sets - 1 reps - 5 minutes hold  - Clamshell with Resistance  - 1 x daily - 7 x weekly - 2 sets - 10 reps - 3 hold  - Sidelying Hip Abduction (Mirrored)  - 1 x daily - 7 x weekly - 2 sets - 10 reps  - Hooklying Transversus Abdominis Palpation  - 1 x daily - 7 x weekly - 1 sets - 15 reps - 5 hold  - Supine March  - 1 x daily - 7 x weekly - 2 sets - 10 reps      Subjective:     History of Present Condition     Date of onset:  08/23/2016    Date is approximation?: yes    Explanation:  Patient reports onset of symptoms in 2018.    History of Present Condition/Chief Complaint:  Mr. Nies is a 43 y.o. right hand dominant adult (male to male transition in process) who presents to outpatient physical therapy with chronic low back pressure, tightness and pain that began in 2018.  Patient cannot remember any specific traumatic mechanism of injury that may have been the result of his symptoms, but he does report that he broke his right leg 2x when he was younger.  The first one was when he was a baby.  The second time he broke it falling off the couch when he was 8.  He can't remember if it was his femur or tibia and he hasn't had a recent x-ray.  Subjective:  Mr. Tonkinson reports that he had his follow-up with Dr. Arlyce Dice, who thinks his issue is mostly lack of strength.  Dr. Arlyce Dice is recommending 2-3 months of physical therapy to see if that helps.  He has been doing his posterior pelvic tilts, but he hasn't noticed any change, yet.    Pain:     Current pain rating:  5  Location:  Right low back and radiating down the anterior aspect of his right lower extremity    Quality:  Tingling, aching and throbbing    Progression:  No change  Social Support:     Lives with: roommate.      Objective:       Palpation     Right Lumbar   Hypertonic in the erector spinae, lumbar paraspinals and quadratus lumborum.    Strength  Right Hip   Planes of Motion   Abduction: 4-  External rotation: 4-  Internal rotation: 5                                  I attest that I have reviewed the above information.  Signed: Patton Salles, PT  09/01/2023 11:48 AM

## 2023-09-06 ENCOUNTER — Ambulatory Visit
Admit: 2023-09-06 | Discharge: 2023-09-06 | Payer: BLUE CROSS/BLUE SHIELD | Attending: Student in an Organized Health Care Education/Training Program | Primary: Student in an Organized Health Care Education/Training Program

## 2023-09-06 ENCOUNTER — Ambulatory Visit: Admit: 2023-09-06 | Discharge: 2023-09-06 | Payer: BLUE CROSS/BLUE SHIELD

## 2023-09-06 DIAGNOSIS — K5909 Other constipation: Principal | ICD-10-CM

## 2023-09-06 DIAGNOSIS — R140 Abdominal distension (gaseous): Principal | ICD-10-CM

## 2023-09-06 DIAGNOSIS — K3184 Gastroparesis: Principal | ICD-10-CM

## 2023-09-06 DIAGNOSIS — K625 Hemorrhage of anus and rectum: Principal | ICD-10-CM

## 2023-09-06 NOTE — Unmapped (Signed)
 [facility] THERAPY SERVICES AT [facility]  OUTPATIENT PHYSICAL THERAPY  09/06/2023  Note Type: Treatment Note       Patient Name: Vincent Waters  Date of Birth:June 23, 1980  Diagnosis:   Encounter Diagnosis   Name Primary?    Chronic right-sided low back pain without sciatica Yes     Referring MD:  Dawna Part, *     Date of Onset of Impairment-08/23/2016  Date PT Care Plan Established or Reviewed-08/24/2023  Date PT Treatment Started-08/24/2023   Visit Count: 3  Plan of Care Effective Date:          Assessment/Plan:    Assessment  Assessment details:    Vincent Waters is reporting relief and symptom improvement from manual therapy.  Progressing right hip and core/trunk strength within tolerance.  He is tolerating it well, but he does fatigue and needs some minor verbal cuing for correct exercise performance.  Written instructions given for supine 90/90 taps and front plank.  He committed to performing them consistently as part of his home exercise program. All questions answered.  This patient requires skilled physical therapy services to address the outlined impairments in order to return to his desired level of function.           Impairments: pain, muscular restrictions, hypertonicity, core weakness, impaired ADLs, joint restriction, decreased strength and gait deviation                      Therapy Goals      Goals:      Goals:  In 12 weeks:  Goal #1: Patient will be independent with progressively monitored home exercise program.   Baseline/Current: Patient is unfamiliar with home exercise program.     Goal #2: Patient will demonstrate lumbar ROM within normal limits without pain in order to perform daily functional activities such as bending to pick an object off the ground, twisting to reach into the patient's backseat or bend backwards in order to lift an object overhead.   Baseline/Current: Patient has painful lumbar ROM (see ROM testing).     Goal #3: Patient will demonstrate right hip strength within normal limits without pain in order to allow for functional tasks such as symmetrical walking, squatting and negotiating stairs.   Baseline/Current: Patient has limited right hip strength (see manual muscle testing).      Goal #4: Patient will be able to lift at least 75 lb from floor to waist with good lifting mechanics in order to demonstrate good core and trunk strength.   Baseline/Current: Patient has pain and difficulty lifting at present.    Goal #5: Patient will report improvement in symptoms while sitting and ability to sit up to 1 hour with minimal symptoms.  Baseline/Current: Patient has difficulty sitting at present.    Goal #6: Patient's FOTO score will improve from 34 at initial evaluation to >/= 53 at discharge in order to demonstrate a significant improvement in self-reported outcome measures that directly result from his course of physical therapy.   Baseline/Current: FOTO = 34    Plan    Therapy options: will be seen for skilled physical therapy services    Planned therapy interventions: 20560, 20561-Dry Needling 1-2, 3+ areas, 97010-Cold Packs/Hot Packs, 97012-Mechanical Traction, 97032, G0283-Electrical Stimulation (unattended, attended), 97110-Therapeutic Exercises, 97112-Neuromuscular Re-education, 97116-Gait Training, 97140-Manual Therapy, 97164-Re-evaluation (PT) and 97530-Therapeutic Activities      Frequency: 1x week    Duration in weeks: 12    Education provided to: patient.  Education provided: Surveyor, minerals, HEP and Treatment options and plan    Education results: verbalized good understanding, needs reinforcement, demonstrates understanding and needs further instruction.    Communication/Consultation: N/A.    Next visit plan:        Manual therapy/TDN to calm lumbar musculature, right hip/core/trunk strengthening.    Total Session Time: 40    Treatment rendered today:      Therapeutic Exercise: 20 minutes  - Patient Education: Plan of Care, Home Exercise Program  - Side Lying Clam With Band: 2 x [12 x 3] (L5)  - Side Lying Hip Abduction: 2 x 12, 2 lb  - Supine TA  - Supine TA/March: 2 x 10, bilateral, alternating  - Supine TA/Toe Taps from 90/90: 2 x 10, bilateral, alternating  - Front Plank: 2 x 30    Manual Therapy: 20 minutes  - Soft Tissue Mobilization right lumbar musculature  - Gentle L4 - S1 CPA in prone  - Side Lying Pelvic Distraction using pillow roll, cranial to caudal  - Side Lying on Left Side lumbar Mobilization (cavitation noted)    Plan details: Access Code: [email or web address]  URL: [email or web address]  Date: 09/06/2023  Prepared by: Christianne Dolin    Exercises  - Supine Posterior Pelvic Tilt  - 1 x daily - 7 x weekly - 1 sets - 20 reps - 5 hold  - Sidelying Thoracolumbar Mobilisation with Towel Roll  - 2 x daily - 7 x weekly - 1 sets - 1 reps - 5 minutes hold  - Clamshell with Resistance  - 1 x daily - 7 x weekly - 2 sets - 10 reps - 3 hold  - Sidelying Hip Abduction (Mirrored)  - 1 x daily - 7 x weekly - 2 sets - 10 reps  - Hooklying Transversus Abdominis Palpation  - 1 x daily - 7 x weekly - 1 sets - 15 reps - 5 hold  - Supine March  - 1 x daily - 7 x weekly - 2 sets - 10 reps  - Supine 90/90 Alternating Toe Touch  - 1 x daily - 7 x weekly - 2 sets - 10 reps  - Full Plank  - 1 x daily - 7 x weekly - 1 sets - 3 reps - 30-45 hold      Subjective:     History of Present Condition     Date of onset:  08/23/2016    Date is approximation?: yes    Explanation:  Patient reports onset of symptoms in 2018.    History of Present Condition/Chief Complaint:  Vincent Waters is a 43 y.o. right hand dominant adult (male to male transition in process) who presents to outpatient physical therapy with chronic low back pressure, tightness and pain that began in 2018.  Patient cannot remember any specific traumatic mechanism of injury that may have been the result of his symptoms, but he does report that he broke his right leg 2x when he was younger.  The first one was when he was a baby. The second time he broke it falling off the couch when he was 8.  He can't remember if it was his femur or tibia and he hasn't had a recent x-ray.  Subjective:  Vincent Waters reports that he felt good after last appointment, but he feels like it's starting to tighten back up again.  Pain:     Current pain rating:  3  Location:  Right low back and radiating down the  anterior aspect of his right lower extremity    Quality:  Tingling, aching and throbbing    Progression:  Improved  Social Support:     Lives with: roommate.      Objective:       Palpation     Right Lumbar   Hypertonic in the erector spinae, lumbar paraspinals and quadratus lumborum.                                  I attest that I have reviewed the above information.  Signed: Patton Salles, PT  09/06/2023 1:10 PM

## 2023-09-06 NOTE — Unmapped (Signed)
 [facility] [place]  [address]  [place] [address]      ESTABLISHED CONSULTATION CLINIC VISIT    REFERRING PROVIDER: Soyla Murphy, MD  [address]  [place],  [place] [address]    PRIMARY CARE PROVIDER: Linus Salmons, MD    ASSESSMENT AND PLAN:     Vincent Waters is a 43 y.o. adult established patient with past medical history significant for gastroparesis, constipation, dysphagia, throracic outlet syndrome, prior cholecystectomy, ADHD presenting for follow up of constipation and regurgitation.     Rectal bleeding  Abdominal bloating  Chronic constipation  Gastroparesis  Doing well on motegrity for constipation. Has been able to titrate reglan to lowest effective dose (bid) for gastroparesis. Reports some increased frequency in rectal bleeding and intermittent rectal pain. Also worsening bloating and gas even when he is having BM. Will plan for colonoscopy and rule out SIBO.     Prior workup:  EGD 02/2023 with LA grade A esophagitis and gastritis (esophageal and stomach bx normal).   GES 02/2023 with delayed gastric emptying (45% retained food at 4hrs).  AXR 04/2022: No significant stool burden  EGD 07/2019 with 3cm hiatal hernia and significant gastroparesis with retained food, examination c/b laryngospasm.   GES in 2019 with 22% retention at 4 hours (unclear if he was on narcotics at that time)  Colonoscopy in 2017 was normal.   TSH and A1c normal.   He has elevated ANA 1:640 and was evaluated by rheumatology without concern for systemic connective tissue disease.     - Colonoscopy; Future  - continue reglan 5mg  bid  - continue motegrity daily  - Hydrogen Breath Test; Future  - doing very well with diet and avoiding food triggers that make his gastroparesis worse    Return in about 3 months (around 12/07/2023).    [name], MD  Clinical Assistant Professor of Medicine  [employer] [place] [facility]  Division of Gastroenterology & Hepatology     SUBJECTIVE:     CHIEF COMPLAINT: Constipation, regurgitation    HISTORY OF PRESENT ILLNESS:  43 y.o. adult patient with past medical history significant for gastroparesis, constipation, dysphagia, throracic outlet syndrome, prior cholecystectomy, ADHD presenting for follow up of constipation and regurgitation.     He is on reglan and prucalopride. He reports LLQ abdominal pain and gas. Also has bloating. Having intermittent rectal bleeding and believes he has a fissure. He tried titrating reglan to lowest effective dose and has done well on 2 times per day.    REVIEW OF SYSTEMS:   The balance of 12 systems reviewed is negative except as noted in the history of present illness.    PROVIDER REVIEWED WITH PATIENT AND UPDATED IN EMR:        OBJECTIVE:   VITAL SIGNS: BP 121/78 (BP Position: Sitting)  - Pulse 87  - Temp 36.3 ??C (97.4 ??F)  - Ht 162.6 cm (5' 4)  - Wt 69.4 kg (153 lb)  - LMP 07/20/2017 (Exact Date)  - BMI 26.26 kg/m??     Wt Readings from Last 5 Encounters:   09/06/23 69.4 kg (153 lb)   08/31/23 71.2 kg (157 lb)   08/01/23 71.2 kg (157 lb)   07/26/23 71.2 kg (157 lb)   07/25/23 71.2 kg (157 lb)       PHYSICAL EXAM:  General appearance: Appears well, no distress.  Pulmonary: Normal work  of breathing. Acyanotic.  Abdominal: soft, nontender, nondistended, no masses or organomegaly.  Skin: No jaundice. No rashes.  Neurologic: Alert, oriented, and appropriate.  Psychiatric: Appropriate.

## 2023-09-08 MED FILL — XOLAIR 300 MG/2 ML SUBCUTANEOUS AUTO-INJECTOR: SUBCUTANEOUS | 28 days supply | Qty: 4 | Fill #7

## 2023-09-08 MED FILL — XOLAIR 75 MG/0.5 ML SUBCUTANEOUS AUTO-INJECTOR: SUBCUTANEOUS | 28 days supply | Qty: 1 | Fill #7

## 2023-09-12 ENCOUNTER — Ambulatory Visit: Admit: 2023-09-12 | Discharge: 2023-09-13 | Payer: BLUE CROSS/BLUE SHIELD

## 2023-09-12 DIAGNOSIS — R070 Pain in throat: Principal | ICD-10-CM

## 2023-09-12 DIAGNOSIS — Z79899 Other long term (current) drug therapy: Principal | ICD-10-CM

## 2023-09-12 DIAGNOSIS — F640 Transsexualism: Principal | ICD-10-CM

## 2023-09-12 LAB — TESTOSTERONE: TESTOSTERONE TOTAL: 1089 ng/dL — ABNORMAL HIGH (ref 8–35)

## 2023-09-12 LAB — HIV ANTIGEN/ANTIBODY COMBO: HIV ANTIGEN/ANTIBODY COMBO: NONREACTIVE

## 2023-09-12 LAB — SYPHILIS SCREEN: SYPHILIS RPR SCREEN: NONREACTIVE

## 2023-09-12 LAB — HEMATOCRIT: HEMATOCRIT: 46.8 % — ABNORMAL HIGH (ref 36.0–46.0)

## 2023-09-13 NOTE — Unmapped (Signed)
 [facility]ERAPY SERVICES AT [facility] [place]  OUTPATIENT PHYSICAL THERAPY  09/13/2023  Note Type: Treatment Note       Patient Name: Vincent Waters  Date of Birth:1981-03-12  Diagnosis:   Encounter Diagnosis   Name Primary?    Chronic right-sided low back pain without sciatica Yes     Referring MD:  Dawna Part, *     Date of Onset of Impairment-08/23/2016  Date PT Care Plan Established or Reviewed-08/24/2023  Date PT Treatment Started-08/24/2023   Visit Count: 4  Plan of Care Effective Date:          Assessment/Plan:    Assessment  Assessment details:    Nils continues to get relief from manual therapy.  Progressing right hip and core/trunk strength within tolerance.  He is tolerating it well, but he does fatigue and needs some minor verbal cuing for correct exercise performance.  All questions answered.  This patient requires skilled physical therapy services to address the outlined impairments in order to return to his desired level of function.           Impairments: pain, muscular restrictions, hypertonicity, core weakness, impaired ADLs, joint restriction, decreased strength and gait deviation                      Therapy Goals      Goals:      Goals:  In 12 weeks:  Goal #1: Patient will be independent with progressively monitored home exercise program.   Baseline/Current: Patient is unfamiliar with home exercise program.     Goal #2: Patient will demonstrate lumbar ROM within normal limits without pain in order to perform daily functional activities such as bending to pick an object off the ground, twisting to reach into the patient's backseat or bend backwards in order to lift an object overhead.   Baseline/Current: Patient has painful lumbar ROM (see ROM testing).     Goal #3: Patient will demonstrate right hip strength within normal limits without pain in order to allow for functional tasks such as symmetrical walking, squatting and negotiating stairs.   Baseline/Current: Patient has limited right hip strength (see manual muscle testing).      Goal #4: Patient will be able to lift at least 75 lb from floor to waist with good lifting mechanics in order to demonstrate good core and trunk strength.   Baseline/Current: Patient has pain and difficulty lifting at present.    Goal #5: Patient will report improvement in symptoms while sitting and ability to sit up to 1 hour with minimal symptoms.  Baseline/Current: Patient has difficulty sitting at present.    Goal #6: Patient's FOTO score will improve from 34 at initial evaluation to >/= 53 at discharge in order to demonstrate a significant improvement in self-reported outcome measures that directly result from his course of physical therapy.   Baseline/Current: FOTO = 34    Plan    Therapy options: will be seen for skilled physical therapy services    Planned therapy interventions: 20560, 20561-Dry Needling 1-2, 3+ areas, 97010-Cold Packs/Hot Packs, 97012-Mechanical Traction, 97032, G0283-Electrical Stimulation (unattended, attended), 97110-Therapeutic Exercises, 97112-Neuromuscular Re-education, 97116-Gait Training, 97140-Manual Therapy, 97164-Re-evaluation (PT) and 97530-Therapeutic Activities      Frequency: 1x week    Duration in weeks: 12    Education provided to: patient.    Education provided: Surveyor, minerals, HEP and Treatment options and plan    Education results: verbalized good understanding, needs reinforcement, demonstrates understanding and  needs further instruction.    Communication/Consultation: N/A.    Next visit plan:        Manual therapy/TDN to calm lumbar musculature, right hip/core/trunk strengthening.    Total Session Time: 40    Treatment rendered today:      Therapeutic Exercise: 20 minutes  - Patient Education: Plan of Care, Home Exercise Program  - Side Lying Clam With Band: 2 x [10 x 3] (L6)  - Side Lying Hip Abduction: 2 x 12, 3 lb  - Supine TA  - Supine TA/March: 2 x 10, bilateral, alternating  - Supine TA/Toe Taps from 90/90: 2 x 10, bilateral, alternating    Manual Therapy: 20 minutes  - Soft Tissue Mobilization right lumbar musculature  - Gentle L4 - S1 CPA in prone  - Side Lying Pelvic Distraction using pillow roll, cranial to caudal  - Side Lying on Left Side lumbar Mobilization (cavitation noted)    Plan details: Access Code: [email or web address]  URL: [email or web address]  Date: 09/06/2023  Prepared by: Christianne Dolin    Exercises  - Supine Posterior Pelvic Tilt  - 1 x daily - 7 x weekly - 1 sets - 20 reps - 5 hold  - Sidelying Thoracolumbar Mobilisation with Towel Roll  - 2 x daily - 7 x weekly - 1 sets - 1 reps - 5 minutes hold  - Clamshell with Resistance  - 1 x daily - 7 x weekly - 2 sets - 10 reps - 3 hold  - Sidelying Hip Abduction (Mirrored)  - 1 x daily - 7 x weekly - 2 sets - 10 reps  - Hooklying Transversus Abdominis Palpation  - 1 x daily - 7 x weekly - 1 sets - 15 reps - 5 hold  - Supine March  - 1 x daily - 7 x weekly - 2 sets - 10 reps  - Supine 90/90 Alternating Toe Touch  - 1 x daily - 7 x weekly - 2 sets - 10 reps  - Full Plank  - 1 x daily - 7 x weekly - 1 sets - 3 reps - 30-45 hold      Subjective:     History of Present Condition     Date of onset:  08/23/2016    Date is approximation?: yes    Explanation:  Patient reports onset of symptoms in 2018.    History of Present Condition/Chief Complaint:  Vincent Waters is a 43 y.o. right hand dominant adult (male to male transition in process) who presents to outpatient physical therapy with chronic low back pressure, tightness and pain that began in 2018.  Patient cannot remember any specific traumatic mechanism of injury that may have been the result of his symptoms, but he does report that he broke his right leg 2x when he was younger.  The first one was when he was a baby.  The second time he broke it falling off the couch when he was 8.  He can't remember if it was his femur or tibia and he hasn't had a recent x-ray.  Subjective:  Mr. Lacock reports that he is generally feeling ok.  He was sore after last appointment and he can tell that he's working the muscles.  He has noticed that the rock is starting to appear in his back again.  He is doing his exercises consistently.  Pain:     Current pain rating:  3  Location:  Right low back and radiating down the anterior aspect of his  right lower extremity    Quality:  Tingling, aching and throbbing    Progression:  Improved  Social Support:     Lives with: roommate.      Objective:       Palpation     Right Lumbar   Hypertonic in the erector spinae, lumbar paraspinals and quadratus lumborum.                                  I attest that I have reviewed the above information.  Signed: Patton Salles, PT  09/13/2023 1:06 PM

## 2023-09-13 NOTE — Unmapped (Signed)
 Copied from CRM #[id number]. Topic: Access To Clinicians - Req Clinic Call Back  >> Sep 13, 2023 10:22 AM [place] G wrote:  Appointment Related: test results. The patient preferred contact: Cell Phone Routine callback turnaround time: 24-48 business hours. (Caller Notified)    Wants to speak PCP regarding a test being too high  Would like a call back

## 2023-09-15 MED FILL — DEXTROAMPHETAMINE-AMPHETAMINE 20 MG TABLET: ORAL | 30 days supply | Qty: 30 | Fill #0

## 2023-09-15 MED FILL — GABAPENTIN 250 MG/5 ML ORAL SOLUTION: ORAL | 30 days supply | Fill #1

## 2023-09-16 MED ORDER — DEXTROAMPHETAMINE-AMPHETAMINE 20 MG TABLET
ORAL_TABLET | Freq: Every day | ORAL | 0 refills | 30.00 days | Status: CP
Start: 2023-09-16 — End: 2023-10-16

## 2023-09-17 MED ORDER — DEXTROAMPHETAMINE-AMPHETAMINE 20 MG TABLET
ORAL_TABLET | Freq: Every day | ORAL | 0 refills | 30.00 days | Status: CN
Start: 2023-09-17 — End: 2023-10-17

## 2023-09-18 NOTE — Unmapped (Signed)
 Addended by: Marylyn Ishihara on: 09/18/2023 11:01 AM     Modules accepted: Orders

## 2023-09-18 NOTE — Unmapped (Signed)
tes

## 2023-09-26 NOTE — Unmapped (Signed)
 Complex Case Management  SUMMARY NOTE    Attempted to contact pt today at Cell number to introduce Complex Case Management services. Left message to return call.; 1st attempt    Discuss at next visit: Introduction to Complex Case Management    [facility] Management Assistant  [facility] Clinical Services  [address]  [place], [place] [address]  She/Her/Hers  P: [phone] F: [phone]  [email or web address]

## 2023-09-28 ENCOUNTER — Ambulatory Visit
Admit: 2023-09-28 | Attending: Rehabilitative and Restorative Service Providers" | Primary: Rehabilitative and Restorative Service Providers"

## 2023-09-28 ENCOUNTER — Ambulatory Visit
Admit: 2023-09-28 | Payer: BLUE CROSS/BLUE SHIELD | Attending: Rehabilitative and Restorative Service Providers" | Primary: Rehabilitative and Restorative Service Providers"

## 2023-09-28 MED FILL — CLONAZEPAM 0.5 MG TABLET: ORAL | 30 days supply | Qty: 30 | Fill #1

## 2023-09-28 NOTE — Unmapped (Signed)
 Complex Case Management  SUMMARY NOTE    Attempted to contact pt today at Cell number to introduce Complex Case Management services. Left message to return call.; 2nd attempt, letter sent.    Discuss at next visit: Introduction to Complex Case Management    [facility] Management Assistant  [facility] Clinical Services  [address]  [place], [place] [address]  She/Her/Hers  P: [phone] F: [phone]  [email or web address]

## 2023-09-28 NOTE — Unmapped (Signed)
 COMPLEX CASE MANAGEMENT   Brief Note    Patient left a message in response to voice mail left by CMA.     Plan:     1. Case Management to:  call patient back      Discuss at next outreach: Introduction to Complex Case Management    Care Coordination Note updated in [facility]: No    [facility] Management Assistant  [facility] Clinical Services  [address], [address]  [place], [place] [address]  She/Her/Hers  P: [phone] F: [phone]  [email or web address]

## 2023-09-28 NOTE — Unmapped (Signed)
 Complex Case Management  SUMMARY NOTE    Care Management Assistant spoke with patient and verified correct patient using two identifiers today to introduce the Complex Case Management program.     Discussed the following:  Program Services, Expectations of participation, and Verified Demographics    Program status: Interested    Discuss at next visit: Introduction to Complex Case Management    Scheduled for: 09/29/23  1:30pm with Care Management Assistant    [facility] Management Assistant  [facility] Clinical Services  [address]  [place], [place] [address]  She/Her/Hers  P: [phone] F: [phone]  [email or web address]

## 2023-09-28 NOTE — Unmapped (Signed)
 [facility] [place]  OUTPATIENT PHYSICAL THERAPY  09/28/2023  Note Type: Treatment Note       Patient Name: Vincent Waters  Date of Birth:[birth year]  Diagnosis:   Encounter Diagnosis   Name Primary?    Chronic right-sided low back pain without sciatica Yes     Referring MD:  Vincent Waters, *     Date of Onset of Impairment-08/23/2016  Date PT Care Plan Established or Reviewed-08/24/2023  Date PT Treatment Started-08/24/2023   Visit Count: 5  Plan of Care Effective Date:          Assessment/Plan:    Assessment  Assessment details:    Vincent Waters continues to get relief from manual therapy.  Progressing right hip and core/trunk strength within tolerance.  He is tolerating it well, but he does fatigue and needs some minor verbal cuing for correct exercise performance.  Written instructions given for supine bridge with alternating knee extension and bird dog.  He committed to performing them consistently as part of his home exercise program. All questions answered.  This patient requires skilled physical therapy services to address the outlined impairments in order to return to his desired level of function.           Impairments: pain, muscular restrictions, hypertonicity, core weakness, impaired ADLs, joint restriction, decreased strength and gait deviation                      Therapy Goals      Goals:      Goals:  In 12 weeks:  Goal #1: Patient will be independent with progressively monitored home exercise program.   Baseline/Current: Patient is unfamiliar with home exercise program.     Goal #2: Patient will demonstrate lumbar ROM within normal limits without pain in order to perform daily functional activities such as bending to pick an object off the ground, twisting to reach into the patient's backseat or bend backwards in order to lift an object overhead.   Baseline/Current: Patient has painful lumbar ROM (see ROM testing).     Goal #3: Patient will demonstrate right hip strength within normal limits without pain in order to allow for functional tasks such as symmetrical walking, squatting and negotiating stairs.   Baseline/Current: Patient has limited right hip strength (see manual muscle testing).      Goal #4: Patient will be able to lift at least 75 lb from floor to waist with good lifting mechanics in order to demonstrate good core and trunk strength.   Baseline/Current: Patient has pain and difficulty lifting at present.    Goal #5: Patient will report improvement in symptoms while sitting and ability to sit up to 1 hour with minimal symptoms.  Baseline/Current: Patient has difficulty sitting at present.    Goal #6: Patient's FOTO score will improve from 34 at initial evaluation to >/= 53 at discharge in order to demonstrate a significant improvement in self-reported outcome measures that directly result from his course of physical therapy.   Baseline/Current: FOTO = 34    Plan    Therapy options: will be seen for skilled physical therapy services    Planned therapy interventions: 20560, 20561-Dry Needling 1-2, 3+ areas, 97010-Cold Packs/Hot Packs, 97012-Mechanical Traction, 97032, G0283-Electrical Stimulation (unattended, attended), 97110-Therapeutic Exercises, 97112-Neuromuscular Re-education, 97116-Gait Training, 97140-Manual Therapy, 97164-Re-evaluation (PT) and 97530-Therapeutic Activities      Frequency: 1x week    Duration in weeks: 12    Education provided to: patient.  Education provided: Vincent Waters, HEP and Treatment options and plan    Education results: verbalized good understanding, needs reinforcement, demonstrates understanding and needs further instruction.    Communication/Consultation: N/A.    Next visit plan:        Manual therapy/TDN to calm lumbar musculature, right hip/core/trunk strengthening.    Total Session Time: 40    Treatment rendered today:      Therapeutic Exercise: 20 minutes  - Patient Education: Plan of Care, Home Exercise Program  - Side Lying Clam With Band: 2 x [12 x 3] (L6)  - Side Lying Hip Abduction: 2 x 12, 3 lb  - Supine TA  - Supine TA/March: 2 x 12, bilateral, alternating  - Supine TA/Toe Taps from 90/90: 2 x 12, bilateral, alternating  - Supine Bridge With Alternating Knee Extension: 2 x 10, bilateral, alternating  - Bird Dog: 2 x 10, bilateral, alternating    Manual Therapy: 20 minutes  - Soft Tissue Mobilization right lumbar musculature  - Gentle L4 - S1 CPA in prone  - Side Lying Pelvic Distraction using pillow roll, cranial to caudal  - Side Lying on Left Side lumbar Mobilization (cavitation noted)    Plan details: Access Code: [email or web address]  URL: [email or web address]  Date: 09/28/2023  Prepared by: Vincent Waters    Exercises  - Supine Posterior Pelvic Tilt  - 1 x daily - 7 x weekly - 1 sets - 20 reps - 5 hold  - Sidelying Thoracolumbar Mobilisation with Towel Roll  - 2 x daily - 7 x weekly - 1 sets - 1 reps - 5 minutes hold  - Clamshell with Resistance  - 1 x daily - 7 x weekly - 2 sets - 10 reps - 3 hold  - Sidelying Hip Abduction (Mirrored)  - 1 x daily - 7 x weekly - 2 sets - 10 reps  - Hooklying Transversus Abdominis Palpation  - 1 x daily - 7 x weekly - 1 sets - 15 reps - 5 hold  - Supine March  - 1 x daily - 7 x weekly - 2 sets - 10 reps  - Supine 90/90 Alternating Toe Touch  - 1 x daily - 7 x weekly - 2 sets - 10 reps  - Full Plank  - 1 x daily - 7 x weekly - 1 sets - 3 reps - 30-45 hold  - Supine Bridge with Leg Extension  - 1 x daily - 7 x weekly - 2 sets - 10 reps - 5 hold  - Bird Dog  - 1 x daily - 7 x weekly - 2 sets - 10 reps      Subjective:     History of Present Condition     Date of onset:  08/23/2016    Date is approximation?: yes    Explanation:  Patient reports onset of symptoms in 2018.    History of Present Condition/Chief Complaint:  Vincent Waters is a 43 y.o. right hand dominant adult (male to male transition in process) who presents to outpatient physical therapy with chronic low back pressure, tightness and pain that began in 2018.  Patient cannot remember any specific traumatic mechanism of injury that may have been the result of his symptoms, but he does report that he broke his right leg 2x when he was younger.  The first one was when he was a baby.  The second time he broke it falling off the couch when he was 8.  He can't  remember if it was his femur or tibia and he hasn't had a recent x-ray.  Subjective:  Mr. Mcelwain reports that his back has been bothering him last couple of days and he thinks it may have something to do with the weather.  He is doing his exercises consistently.  Pain:     Current pain rating:  7  Location:  Right low back and radiating down the anterior aspect of his right lower extremity    Quality:  Aching    Progression:  Improved  Social Support:     Lives with: roommate.      Objective:       Palpation     Right Lumbar   Hypertonic in the erector spinae, lumbar paraspinals and quadratus lumborum.                                  I attest that I have reviewed the above information.  Signed: Willistine Hartigan, PT  09/28/2023 12:23 PM

## 2023-09-29 NOTE — Unmapped (Signed)
 Copied from CRM #[id number]. Topic: Access To Clinicians - Req Clinic Call Back  >> Sep 29, 2023 10:33 AM Dione Franks wrote:  Patient states that he went to two different dental offices and they instructed the patient to ask Dr. Arvie Latus to give the dental office a call. Patient states that the dental office stated that he'd have to be sedated and stated that the patient has   Periodontal disease.    [facility]  [address] [place], [place], [place], [place] [address]  [phone].     The patient preferred contact: Mychart  Routine callback turnaround time: 24-48 business hours. Programmer, systems Notified)

## 2023-09-29 NOTE — Unmapped (Signed)
 Addended byBarrie Lie on: 09/29/2023 01:28 PM     Modules accepted: Orders

## 2023-09-29 NOTE — Unmapped (Signed)
 Called the patient and notified them that I called the school of dentistry and they are going to give them a call to get them scheduled for an appointment because they said they never received the referral and I informed the patient that if they go get a print out of the x-rays they had at the other dental office the dental school said they would not have to get that done again.

## 2023-09-29 NOTE — Unmapped (Signed)
 Complex Case Management  SUMMARY NOTE    Attempted to contact pt today at Cell number to introduce Complex Case Management services. Left message to return call.; 2nd attempt    Discuss at next visit: Introduction to Complex Case Management    [facility] Management Assistant  [facility]  [address]  [place], [place] [address]  She/Her/Hers  P: [phone] F: [phone]  [email or web address]

## 2023-10-02 NOTE — Unmapped (Signed)
 Complex Case Management  SUMMARY NOTE    Attempted to contact pt today at Other number to introduce Complex Case Management services.  MyChart ; 2nd attempt, letter sent.    Discuss at next visit: Introduction to Complex Case Management    [facility] Management Assistant  [facility]  [address], [address]  [place], [place] [address]  She/Her/Hers  P: [phone] F: [phone]  [email or web address]

## 2023-10-04 NOTE — Unmapped (Signed)
 Patient Advice Request Patient Name: [facility]  Caller: Self (Patient)  Contact Method: Telephone Call: Time- Any Time [phone] or MyChart  Reason for Call: Patient would like for you to resend the dental referral and mark as urgent. Patient stated they place never received the referral.  Previously Discussed: yes  Appointment Offered: No

## 2023-10-05 NOTE — Unmapped (Signed)
 [facility] [place]  OUTPATIENT PHYSICAL THERAPY  10/05/2023  Note Type: Treatment Note       Patient Name: Vincent Waters  Date of Birth:[birth year]  Diagnosis:   Encounter Diagnosis   Name Primary?    Chronic right-sided low back pain without sciatica Yes     Referring MD:  Amador Junes, *     Date of Onset of Impairment-08/23/2016  Date PT Care Plan Established or Reviewed-08/24/2023  Date PT Treatment Started-08/24/2023   Visit Count: 6  Plan of Care Effective Date:          Assessment/Plan:    Assessment  Assessment details:    Based on Gaither's subjective report, he had a sudden lumbar hyperextension moment during his fall that put a lot of pressure on his facets and tightened up his right lumbar musculature again.  He responded well to manual therapy and TDN.  Immediately afterwards, he stated he felt much better.  Reviewed core exercises I would like him to focus on for the next days, which should help alleviate pain and tightness.  He agreed to do this.  All questions answered.  This patient requires skilled physical therapy services to address the outlined impairments in order to return to his desired level of function.           Impairments: pain, muscular restrictions, hypertonicity, core weakness, impaired ADLs, joint restriction, decreased strength and gait deviation                      Therapy Goals      Goals:      Goals:  In 12 weeks:  Goal #1: Patient will be independent with progressively monitored home exercise program.   Baseline/Current: Patient is unfamiliar with home exercise program.     Goal #2: Patient will demonstrate lumbar ROM within normal limits without pain in order to perform daily functional activities such as bending to pick an object off the ground, twisting to reach into the patient's backseat or bend backwards in order to lift an object overhead.   Baseline/Current: Patient has painful lumbar ROM (see ROM testing).     Goal #3: Patient will demonstrate right hip strength within normal limits without pain in order to allow for functional tasks such as symmetrical walking, squatting and negotiating stairs.   Baseline/Current: Patient has limited right hip strength (see manual muscle testing).      Goal #4: Patient will be able to lift at least 75 lb from floor to waist with good lifting mechanics in order to demonstrate good core and trunk strength.   Baseline/Current: Patient has pain and difficulty lifting at present.    Goal #5: Patient will report improvement in symptoms while sitting and ability to sit up to 1 hour with minimal symptoms.  Baseline/Current: Patient has difficulty sitting at present.    Goal #6: Patient's FOTO score will improve from 34 at initial evaluation to >/= 53 at discharge in order to demonstrate a significant improvement in self-reported outcome measures that directly result from his course of physical therapy.   Baseline/Current: FOTO = 34    Plan    Therapy options: will be seen for skilled physical therapy services    Planned therapy interventions: 20560, 20561-Dry Needling 1-2, 3+ areas, 97010-Cold Packs/Hot Packs, 97012-Mechanical Traction, 97032, G0283-Electrical Stimulation (unattended, attended), 97110-Therapeutic Exercises, 97112-Neuromuscular Re-education, 97116-Gait Training, 97140-Manual Therapy, 97164-Re-evaluation (PT) and 97530-Therapeutic Activities      Frequency: 1x week  Duration in weeks: 12    Education provided to: patient.    Education provided: Surveyor, minerals, HEP and Treatment options and plan    Education results: verbalized good understanding, needs reinforcement, demonstrates understanding and needs further instruction.    Communication/Consultation: N/A.    Next visit plan:        Manual therapy/TDN to calm lumbar musculature, right hip/core/trunk strengthening.    Total Session Time: 35    Treatment rendered today:      Therapeutic Exercise: 5 minutes  - Patient Education: Plan of Care, Home Exercise Program  - Supine TA/Pelvic Tilt Review  - Supine TA/Pelvic Tilt/March Review  - Supine TA/Pelvic Tilt/90-90 Heel Taps Review  - Front Plank Review    Manual Therapy: 30 minutes  - Soft Tissue Mobilization right lumbar musculature  - Side Lying Pelvic Distraction using pillow roll, cranial to caudal  [facility]  was informed of the risks associated with dry needling and he signed consent form.  Patient screened for any contraindications to needling including pregnancy, infection, antibiotic use, anticoagulants use, autoimmune issues, uncontrolled diabetes, and active cancer/treatment and does not have any of these conditions.  DRY NEEDLING FOR RELEASING IDENTIFIED TRIGGER POINTS TO FACILITATE MANUAL THERAPY TECHNIQUES AND ALLOW MUSCLE TO RETURN TO OPTIMAL LENGTH (NO CHARGE).  Right lumbar multifidi L3 - L5 trigger points with 50mm x .30mm with Bony Backdrop established for safety - Local Twitch Response produced     Right lumbar iliocostalis trigger point with 50mm x .30mm with Flat Palpation/Direct Approach established for safety - Local Twitch Response produced    Plan details: Access Code: [email or web address]  URL: [email or web address]  Date: 09/28/2023  Prepared by: Irineo Manns    Exercises  - Supine Posterior Pelvic Tilt  - 1 x daily - 7 x weekly - 1 sets - 20 reps - 5 hold  - Sidelying Thoracolumbar Mobilisation with Towel Roll  - 2 x daily - 7 x weekly - 1 sets - 1 reps - 5 minutes hold  - Clamshell with Resistance  - 1 x daily - 7 x weekly - 2 sets - 10 reps - 3 hold  - Sidelying Hip Abduction (Mirrored)  - 1 x daily - 7 x weekly - 2 sets - 10 reps  - Hooklying Transversus Abdominis Palpation  - 1 x daily - 7 x weekly - 1 sets - 15 reps - 5 hold  - Supine March  - 1 x daily - 7 x weekly - 2 sets - 10 reps  - Supine 90/90 Alternating Toe Touch  - 1 x daily - 7 x weekly - 2 sets - 10 reps  - Full Plank  - 1 x daily - 7 x weekly - 1 sets - 3 reps - 30-45 hold  - Supine Bridge with Leg Extension  - 1 x daily - 7 x weekly - 2 sets - 10 reps - 5 hold  - Bird Dog  - 1 x daily - 7 x weekly - 2 sets - 10 reps      Subjective:     History of Present Condition     Date of onset:  08/23/2016    Date is approximation?: yes    Explanation:  Patient reports onset of symptoms in 2018.    History of Present Condition/Chief Complaint:  Mr. Fulco is a 43 y.o. right hand dominant adult (male to male transition in process) who presents to outpatient physical therapy with chronic low back pressure,  tightness and pain that began in 2018.  Patient cannot remember any specific traumatic mechanism of injury that may have been the result of his symptoms, but he does report that he broke his right leg 2x when he was younger.  The first one was when he was a baby.  The second time he broke it falling off the couch when he was 8.  He can't remember if it was his femur or tibia and he hasn't had a recent x-ray.  Subjective:  Mr. Molinari reports that he was feeling great (no real pain and moving around well),, but then he had a fall because he felt dizzy.  He remembers that his right leg came forward and then his knee bent and he arched backwards and fell onto his right hip.  He was able to get up and move.  He doesn't feel like anything was broken.  He is doing his exercises consistently.  Pain:     Current pain rating:  6  Location:  Right low back and radiating down the anterior aspect of his right lower extremity    Quality:  Aching    Progression:  Improved      Objective:       Palpation     Right Lumbar   Hypertonic in the erector spinae, lumbar paraspinals and quadratus lumborum.    Joint Mobility  Lumbar  Lumbar Joint Mobility   Hypomobile at L4 and L5.   Pain at L4 and L5.                                   I attest that I have reviewed the above information.  Signed: Willistine Hartigan, PT  10/05/2023 12:26 PM

## 2023-10-06 ENCOUNTER — Ambulatory Visit
Admit: 2023-10-06 | Discharge: 2023-10-07 | Payer: BLUE CROSS/BLUE SHIELD | Attending: Student in an Organized Health Care Education/Training Program | Primary: Student in an Organized Health Care Education/Training Program

## 2023-10-06 DIAGNOSIS — I340 Nonrheumatic mitral (valve) insufficiency: Principal | ICD-10-CM

## 2023-10-06 DIAGNOSIS — F640 Transsexualism: Principal | ICD-10-CM

## 2023-10-06 DIAGNOSIS — K0889 Other specified disorders of teeth and supporting structures: Principal | ICD-10-CM

## 2023-10-06 DIAGNOSIS — J310 Chronic rhinitis: Principal | ICD-10-CM

## 2023-10-06 DIAGNOSIS — R42 Dizziness and giddiness: Principal | ICD-10-CM

## 2023-10-06 DIAGNOSIS — Z79899 Other long term (current) drug therapy: Principal | ICD-10-CM

## 2023-10-06 NOTE — Unmapped (Signed)
 [facility] at [facility]  Established Patient Clinic Note    Assessment/Plan:   Vincent Waters is a 43 y.o.adult who presents for follow-up.    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    Relevant Orders    Testosterone     Other Visit Diagnoses         Dizziness    -  Primary    Relevant Orders    Echocardiogram W Colorflow Spectral Doppler      Pain, dental          Mitral valve insufficiency, unspecified etiology        Relevant Orders    Echocardiogram W Colorflow Spectral Doppler          - Current meds:   Current Outpatient Medications:     albuterol 2.5 mg /3 mL (0.083 %) nebulizer solution, Inhale 3 mL (2.5 mg total) by nebulization every six (6) hours as needed for wheezing or shortness of breath., Disp: 90 mL, Rfl: 1    azelastine (ASTELIN) 137 mcg (0.1 %) nasal spray, Instill 2 sprays into each nostril two (2) times a day as needed for rhinitis., Disp: 30 mL, Rfl: 1    budesonide-formoterol (SYMBICORT) 160-4.5 mcg/actuation inhaler, Inhale 1 puff by mouth Two (2) times a day., Disp: 10.2 g, Rfl: 5    cetirizine (ZYRTEC) 10 MG tablet, Take 1 tablet (10 mg total) by mouth daily., Disp: 30 tablet, Rfl: 12    clonazePAM (KLONOPIN) 0.5 MG tablet, Take 0.5 tablets (0.25 mg total) by mouth two (2) times a day as needed for anxiety., Disp: 30 tablet, Rfl: 5    dextroamphetamine-amphetamine (ADDERALL) 20 mg tablet, Take 1 tablet (20 mg total) by mouth daily., Disp: 30 tablet, Rfl: 0    dextroamphetamine-amphetamine (ADDERALL) 20 mg tablet, Take 1 tablet (20 mg total) by mouth daily., Disp: 30 tablet, Rfl: 0    dextroamphetamine-amphetamine (ADDERALL) 20 mg tablet, Take 1 tablet (20 mg total) by mouth daily., Disp: 30 tablet, Rfl: 0    [START ON 10/16/2023] dextroamphetamine-amphetamine (ADDERALL) 20 mg tablet, Take 1 tablet (20 mg total) by mouth daily., Disp: 30 tablet, Rfl: 0    diclofenac sodium (VOLTAREN) 1 % gel, Apply 2 g topically four (4) times a day., Disp: 100 g, Rfl: 0    emtricitabine-tenofovir alafen (DESCOVY) 200-25 mg tablet, Take 1 tablet by mouth daily., Disp: 90 tablet, Rfl: 0    EPINEPHrine (EPIPEN) 0.3 mg/0.3 mL injection, Inject 0.3 mL (0.3 mg total) under the skin once for 1 dose., Disp: 2 each, Rfl: 0    gabapentin (NEURONTIN) 250 mg/5 mL oral solution, Take 12 mL (600 mg total) by mouth Three (3) times a day., Disp: 1080 mL, Rfl: 2    inhalational spacing device Spcr, Use as directed with albuterol and symbicort, Disp: 1 each, Rfl: 1    ipratropium (ATROVENT) 42 mcg (0.06 %) nasal spray, 1-2 sprays in each nostril as needed for drainage, up to 4 time daily., Disp: 15 mL, Rfl: 2    metoclopramide (REGLAN) 10 MG tablet, Take 1/2 tablet (5 mg total) by mouth Three (3) times a day with a meal., Disp: 135 tablet, Rfl: 3    nebulizer and compressor (COMP-AIR NEBULIZER COMPRESSOR) Devi, 1 each by Miscellaneous route nightly as needed., Disp: 1 each,  Rfl: 0    needle, disp, 25 Willem (BD REGULAR BEVEL NEEDLES) 25 Jeoffrey x 5/8 Ndle, For subcutaneous hormone injection., Disp: 25 each, Rfl: 0    nifedipine  0.3% lidocaine  1.5% in petrolatum ointment, Apply a pea-size amount to anal area 3 times a day, Disp: 100 g, Rfl: 0    omalizumab  (XOLAIR ) 300 mg/2 mL auto-injector, Inject the contents of 1 pen (300 mg) under the skin every fourteen (14) days., Disp: 4 mL, Rfl: 11    omalizumab  75 mg/0.5 mL AtIn, Inject the contents of 1 pen (75 mg) under the skin every fourteen (14) days., Disp: 1 mL, Rfl: 11    pantoprazole  (PROTONIX ) 40 MG tablet, Take 1 tablet (40 mg total) by mouth daily., Disp: 90 tablet, Rfl: 3    propranolol  (INDERAL ) 10 MG tablet, Take 1 tablet (10 mg total) by mouth two (2) times a day., Disp: 60 tablet, Rfl: 11    prucalopride (MOTEGRITY ) 2 mg Tab, Take 1 tablet (2 mg total) by mouth daily., Disp: 90 tablet, Rfl: 3    safety needles (BD ECLIPSE) 25 Dillan x 1 Ndle, use for testosterone , Disp: 12 each, Rfl: 0    safety needles (BD SAFETYGLIDE NEEDLE) 18 Demarkis x 1 1/2 Ndle, For drawing hormone injection, Disp: 25 each, Rfl: 0    syringe, disposable, (EASY TOUCH LUER LOCK SYRINGE) 1 mL Syrg, Use for weekly hormone injection., Disp: 4 each, Rfl: 0    syringe, disposable, (EASY TOUCH LUER LOCK SYRINGE) 1 mL Syrg, Use for testosterone , Disp: 12 each, Rfl: 0    syringe, disposable, 2.5 mL Syrg, Use weekly, Disp: 30 Syringe, Rfl: 2    testosterone  cypionate (DEPOTESTOTERONE CYPIONATE) 200 mg/mL injection, Inject 0.2 mL (40 mg total) into the muscle once a week., Disp: 12 mL, Rfl: 0    triamcinolone  (KENALOG ) 0.1 % cream, Apply topically Two (2) times a day., Disp: 15 g, Rfl: 0    Current Facility-Administered Medications:     omalizumab  (XOLAIR ) injection 300 mg, 300 mg, Subcutaneous, Q28 Days, [name], [name], [name], 300 mg at 05/24/22 1138    Assessment & Plan  # Dizziness and Vertigo - history of mitral regurgitation   Dizziness and vertigo likely vestibular in origin, possibly exacerbated by sinus and dental issues, however discussed need to r/o cardiac etiology given history of mitral valve regurgitation.   - Order echocardiogram  - Consider vestibular rehabilitation therapy if dysfunction confirmed.  - Managing dental / sinus issues per below    # Dental Issues - chronic rhinitis  Dental issues, including impacted wisdom teeth and periodontal disease, may contribute to sinus problems and exacerbate dizziness and vertigo.  - Contact dental school for evaluation and treatment; provided pt with number today (unable to reach them by calling myself, likely closed for Good Friday)    # Supratherapeutic testosterone  level  Testosterone  levels unexpectedly high despite stable dose. Possible dosing error or natural fluctuation. Will RTC for repeat testosterone  level on Monday (true mid-cycle level), and if persistently elevated would be in agreement with splitting dose from 0.2 mg weekly to 0.1 mg twice weekly.   - Repeat testosterone  level test on Monday.  - Consider twice-weekly dosing regimen.    # Gastrointestinal Issues  Requires new gastroenterologist as current provider is leaving. Scheduled breath test for SIBO and colonoscopy in three months.  - Refer to Dr. Shella Devoid at [facility] for gastroenterology care.  - Ensure completion of scheduled breath test for SIBO.  - Schedule colonoscopy in  three months.    # General Health Maintenance  Requires documentation for benefits due to inability to work 2/2 multiple symptoms.   - Provide a note for benefits indicating inability to work.    HEALTH MAINTENANCE ITEMS STILL DUE:  There are no preventive care reminders to display for this patient.  Follow-up: Return in about 3 days (around 10/09/2023) for Lab visit.    Future Appointments   Date Time Provider Department Center   10/09/2023 11:15 AM [place] [facility] [place] LAB ONLY UNCFMFAYS [facility]   10/12/2023 12:15 PM Charletta Cons, PT PTOT [facility]   10/15/2023 10:00 AM IC MRI RM 2 IMRIRL[facility]  - IC   10/17/2023  9:30 AM Mebrahtu, Ludivina Safe, FNP ANESPAINMRKT [facility]   10/19/2023 11:00 AM Rayala, Benjiman Bras, MD [facility]   10/24/2023 10:00 AM Edie Goon, FNP [facility]   11/09/2023 10:30 AM Andrea Banda, PMHNP OPT[facility]ilcom [facility]   11/16/2023  9:00 AM Abbey Hobby, MD HVVSURMMNT [facility]   01/01/2024  8:00 AM Caid, Eusebio High, MD UNCALLERGET [facility]   01/01/2024  9:20 AM MEDDDN MOTILITY NURSE UNCGIMEDET [facility]   01/04/2024  8:45 AM Kimple, Adam [place], MD UNCOTOMEWVIL [facility]       Subjective   Vincent Waters is a 43 y.o. adult  coming to clinic today for follow-up.    Chief Complaint   Patient presents with    Follow-up     Dental Referral (never got contacted), also needs a letter for EBT funds. Lastly, back pain isn't going away (feel on his back last Saturday - PT says it's really agitated). He has been having dizzy spells which is why he fell. GI doctor is leaving - needs a new GI doctor.     History of Present Illness  He is a 43 year old male who presents with dizziness and falls.    He has been experiencing episodes of dizziness and falls, with the most recent incidents occurring last Friday and Saturday. The dizziness is sudden, causing him to veer off to the side and trip, leading to a fall where his right leg popped back and he landed on his back. He does not recall hitting his head. No chest pain or shortness of breath prior to these episodes. During these episodes, his vision becomes dark briefly, but he can refocus his eyes after shaking his head. These symptoms have been ongoing for some time, and despite consulting neurology, no definitive cause has been identified.    He has a history of headaches, and an MRI performed last year showed a few foci of subcortical white matter T2/FLAIR high signal, which may reflect chronic microvascular ischemic change, and low-lying cerebellar tonsils. However, two neurologists have indicated that these findings are not contributing to his symptoms.    He reports significant dental issues, including periodontal disease, cavities, and impacted wisdom teeth. A dentist suggested these could be contributing to his sinus problems, describing a 'caving effect' in his sinus cavity due to dental issues. Oral surgery has been recommended to address these problems.    He has a history of mitral regurgitation identified in an echocardiogram from 2020.    He is currently on testosterone  therapy, with a recent level noted to be unusually high. He injects testosterone  weekly and suspects a dosing error may have contributed to the elevated level.    I have reviewed the problem list, medications, and allergies and have updated/reconciled them if needed.  Vincent Waters  reports that he has never smoked. He has never been exposed to tobacco smoke. He has never used smokeless tobacco.  Health Maintenance   Topic Date Due    DTaP/Tdap/Td Vaccines (2 - Td or Tdap) 08/19/2027    Lipid Screening  12/26/2027    Pneumococcal Vaccine 0-49  Completed    Hepatitis C Screen  Completed    COVID-19 Vaccine  Completed    Influenza Vaccine  Completed       Objective     VITALS: BP 117/87 (BP Site: R Arm, BP Position: Sitting, BP Cuff Size: Medium)  - Pulse 89  - Temp 37.4 ??C (99.3 ??F) (Temporal)  - Ht 162.6 cm (5' 4.02)  - LMP 07/20/2017 (Exact Date)  - BMI 26.25 kg/m??     Physical Exam  General: well-appearing, sitting upright in no acute distress  Head: Normocephalic, atraumatic  ENT: No dental trauma noted.   Eyes: conjunctiva normal, non-erythematous, non-icteric, no discharge.  Neck: no thyroid enlargement or masses  CV: RRR, no MRG  Lungs: CTAB, no increased work of breathing or audible wheezing  Skin: Warm, dry, no erythema or rash on exposed skin  Musculoskeletal: No visible gait abnormalities  Neurologic: Alert & oriented x 3, no gross sensorimotor abnormalities  Psychiatric: Pleasant, cooperative, good eye contact, appropriate thought processes    Wt Readings from Last 3 Encounters:   09/06/23 69.4 kg (153 lb)   08/31/23 71.2 kg (157 lb)   08/01/23 71.2 kg (157 lb)      PHQ-9 PHQ-9 Total Score   10/22/2020   9:00 AM 6   09/04/2020   9:00 AM 3   08/21/2020   9:00 AM 5   05/22/2020   3:00 PM 9     LABS/IMAGING: I have reviewed pertinent recent labs and imaging in Epic.    I personally spent a total of 30 minutes taking care of this patient including time with the patient, pre-visit planning, documentation, reviewing labs, coordinating care, and reviewing documentation from other providers in [facility] and in other specialties.  This was all done on the day of the visit.  My (and this clinic's) longitudinal relationship with this patient was instrumental in caring for this patient today including caring for their chronic medical and psychiatric conditions.      Jenny Mohs, MD, MPH (he/him)  [facility]  [address]   [place], [place] [address]  Phone: [phone]  Fax: [phone]

## 2023-10-09 ENCOUNTER — Ambulatory Visit: Admit: 2023-10-09 | Discharge: 2023-10-10 | Payer: BLUE CROSS/BLUE SHIELD

## 2023-10-09 DIAGNOSIS — Z79899 Other long term (current) drug therapy: Principal | ICD-10-CM

## 2023-10-09 DIAGNOSIS — F640 Transsexualism: Principal | ICD-10-CM

## 2023-10-09 LAB — TESTOSTERONE: TESTOSTERONE TOTAL: 754 ng/dL — ABNORMAL HIGH (ref 8–35)

## 2023-10-09 NOTE — Unmapped (Unsigned)
 Copied from CRM #[id number]. Topic: Referral - Referral Status  >> Oct 09, 2023  4:05 PM Selena Daily wrote:  The patient is requesting for the Front Desk to contact them regarding the status of the referral/order request for [facility].    Patient states that this referral was initially sent on 01/28 and then again on 04/11. Patient is still being told that they have yet to receive the referral. Patient states that [facility] called him today and provided him with a new to Department location. Instead of referral being sent to [facility] it should be sent to [facility]    [facility]    Coverage has been verified and updated if needed: Yes    Please contact The patient by Cell Phone     Urgent callback turnaround time: within 24 business hours. Programmer, systems Notified)

## 2023-10-09 NOTE — Unmapped (Signed)
 Addended by: Dwan Hemmelgarn on: 10/09/2023 11:17 AM     Modules accepted: Orders

## 2023-10-09 NOTE — Unmapped (Signed)
 [facility] Refill Coordination Note    Vincent Waters, [place]: 1981/06/01  Phone: [phone] (home) [phone] (work)      All above HIPAA information was verified with patient.         10/07/2023     8:57 PM   Specialty Rx Medication Refill Questionnaire   Which Medications would you like refilled and shipped? Solar   Please list all current allergies: Latex penecillin shellfish   Have you missed any doses in the last 30 days? No   Have you had any changes to your medication(s) since your last refill? No   How many days remaining of each medication do you have at home? None   Have you experienced any side effects in the last 30 days? No   Please enter the full address (street address, city, state, zip code) where you would like your medication(s) to be delivered to. [address] [place] [address]   Please specify on which day you would like your medication(s) to arrive. Note: if you need your medication(s) within 3 days, please call the pharmacy to schedule your order at [phone]  10/12/2023   Has your insurance changed since your last refill? No   Would you like a pharmacist to call you to discuss your medication(s)? No   Do you require a signature for your package? (Note: if we are billing Medicare Part B or your order contains a controlled substance, we will require a signature) No   I have been provided my out of pocket cost for my medication and approve the pharmacy to charge the amount to my credit card on file. Yes         Completed refill call assessment today to schedule patient's medication shipment from the [facility] [phone]).  All relevant notes have been reviewed.       Confirmed patient received a Conservation officer, historic buildings and a Surveyor, mining with first shipment. The patient will receive a drug information handout for each medication shipped and additional FDA Medication Guides as required.         REFERRAL TO PHARMACIST Referral to the pharmacist: Not needed      [facility]     Shipping address confirmed in Epic.     Delivery Scheduled: Yes, Expected medication delivery date: 04/24.     Medication will be delivered via Same Day Courier to the prescription address in Epic WAM.    Vincent Waters   [facility] Specialty Technician

## 2023-10-10 NOTE — Unmapped (Signed)
 Copied from CRM #[id number]. Topic: Access To Clinicians - Req Clinic Call Back  >> Oct 10, 2023  9:25 AM Larey Plenty wrote:  Clinical Advice: MRI for hip appointment was originally scheduled for April. Due to a scheduling error he was told it had to be rescheduled to June 24th. His insurance only covered and authorized it for 4/11-5/11 and it the authorization needs to be extended.     Pt is requesting the PCP calls and updates the insurance company on his medical condition for coverage and the date needs to be extended so they will cover the new MRI date.     Request a call back or message on Mychart to make sure it can be done.     They have been seen for this issue previously.  The patient preferred contact: Cell Phone Routine callback turnaround time: 24-48 business hours. Programmer, systems Notified)

## 2023-10-12 MED FILL — XOLAIR 300 MG/2 ML SUBCUTANEOUS AUTO-INJECTOR: SUBCUTANEOUS | 28 days supply | Qty: 4 | Fill #8

## 2023-10-12 MED FILL — XOLAIR 75 MG/0.5 ML SUBCUTANEOUS AUTO-INJECTOR: SUBCUTANEOUS | 28 days supply | Qty: 1 | Fill #8

## 2023-10-12 NOTE — Unmapped (Signed)
 [facility] THERAP[facility] SERVICES AT [facility]  OUTPATIENT PHYSICAL THERAPY  10/12/2023  Note Type: Treatment Note       Patient Name: Vincent Waters  Date of Birth:26-Apr-1981  Diagnosis:   Encounter Diagnosis   Name Primary?    Chronic right-sided low back pain without sciatica Yes     Referring MD:  Amador Junes, *     Date of Onset of Impairment-08/23/2016  Date PT Care Plan Established or Reviewed-08/24/2023  Date PT Treatment Started-08/24/2023   Visit Count: 7  Plan of Care Effective Date:          Assessment/Plan:    Assessment  Assessment details:    Angell has just about returned to baseline following his recent fall.  He still has some residual lumbar hypertonicity, but his hip is stronger.  At this point, we're really focusing on improving his core strength, particularly his plank (likely due to his previous hernia surgery).  Encouraged him to work on this as much as he can and he agreed to do this.  Written instructions given for dead bugs.  He committed to performing them consistently as part of his home exercise program. All questions answered.  This patient requires skilled physical therapy services to address the outlined impairments in order to return to his desired level of function.           Impairments: pain, muscular restrictions, hypertonicity, core weakness, impaired ADLs, joint restriction, decreased strength and gait deviation                      Therapy Goals      Goals:      Goals:  In 12 weeks:  Goal #1: Patient will be independent with progressively monitored home exercise program.   Baseline/Current: Patient is unfamiliar with home exercise program.     Goal #2: Patient will demonstrate lumbar ROM within normal limits without pain in order to perform daily functional activities such as bending to pick an object off the ground, twisting to reach into the patient's backseat or bend backwards in order to lift an object overhead.   Baseline/Current: Patient has painful lumbar ROM (see ROM testing).     Goal #3: Patient will demonstrate right hip strength within normal limits without pain in order to allow for functional tasks such as symmetrical walking, squatting and negotiating stairs.   Baseline/Current: Patient has limited right hip strength (see manual muscle testing).      Goal #4: Patient will be able to lift at least 75 lb from floor to waist with good lifting mechanics in order to demonstrate good core and trunk strength.   Baseline/Current: Patient has pain and difficulty lifting at present.    Goal #5: Patient will report improvement in symptoms while sitting and ability to sit up to 1 hour with minimal symptoms.  Baseline/Current: Patient has difficulty sitting at present.    Goal #6: Patient's FOTO score will improve from 34 at initial evaluation to >/= 53 at discharge in order to demonstrate a significant improvement in self-reported outcome measures that directly result from his course of physical therapy.   Baseline/Current: FOTO = 34    Plan    Therapy options: will be seen for skilled physical therapy services    Planned therapy interventions: 20560, 20561-Dry Needling 1-2, 3+ areas, 97010-Cold Packs/Hot Packs, 97012-Mechanical Traction, 97032, G0283-Electrical Stimulation (unattended, attended), 97110-Therapeutic Exercises, 97112-Neuromuscular Re-education, 97116-Gait Training, 97140-Manual Therapy, 97164-Re-evaluation (PT) and 97530-Therapeutic Activities  Frequency: 1x week    Duration in weeks: 12    Education provided to: patient.    Education provided: Surveyor, minerals, HEP and Treatment options and plan    Education results: verbalized good understanding, needs reinforcement, demonstrates understanding and needs further instruction.    Communication/Consultation: N/A.    Next visit plan:        Manual therapy/TDN to calm lumbar musculature, right hip/core/trunk strengthening.    Total Session Time: 45    Treatment rendered today:      Therapeutic Exercise: 30 minutes  - Patient Education: Plan of Care, Home Exercise Program  - Supine TA/Pelvic Tilt: 20 x   - Supine TA/Pelvic Tilt/March: 20 x   - Supine TA/Pelvic Tilt/90-90 Heel Taps: 2 x 10, bilateral, alternating  - Supine TA/Dead Bug: 2 x 5, bilateral alternating  - Bird Dog: 2 x 10, bilateral, alternating    Manual Therapy: 15 minutes  - Soft Tissue Mobilization right lumbar musculature  - Side Lying Pelvic Distraction using pillow roll, cranial to caudal  [facility]  was informed of the risks associated with dry needling and he signed consent form.  Patient screened for any contraindications to needling including pregnancy, infection, antibiotic use, anticoagulants use, autoimmune issues, uncontrolled diabetes, and active cancer/treatment and does not have any of these conditions.  DRY NEEDLING FOR RELEASING IDENTIFIED TRIGGER POINTS TO FACILITATE MANUAL THERAPY TECHNIQUES AND ALLOW MUSCLE TO RETURN TO OPTIMAL LENGTH (NO CHARGE).  Right lumbar multifidi L3 - L5 trigger points with 50mm x .30mm with Bony Backdrop established for safety - Local Twitch Response produced     Right lumbar iliocostalis trigger point with 50mm x .30mm with Flat Palpation/Direct Approach established for safety - Local Twitch Response produced  Right QL trigger point with 75mm x .30mm with Flat Palpation/Direct Approach established for safety - Local Twitch Response produced    Plan details: Access Code: [email or web address]  URL: [email or web address]  Date: 10/12/2023  Prepared by: Irineo Manns    Exercises  - Supine Posterior Pelvic Tilt  - 1 x daily - 7 x weekly - 1 sets - 20 reps - 5 hold  - Sidelying Thoracolumbar Mobilisation with Towel Roll  - 2 x daily - 7 x weekly - 1 sets - 1 reps - 5 minutes hold  - Clamshell with Resistance (Mirrored)  - 1 x daily - 7 x weekly - 2 sets - 10 reps - 3 hold  - Sidelying Hip Abduction (Mirrored)  - 1 x daily - 7 x weekly - 2 sets - 10 reps  - Hooklying Transversus Abdominis Palpation  - 1 x daily - 7 x weekly - 1 sets - 15 reps - 5 hold  - Supine March  - 1 x daily - 7 x weekly - 2 sets - 10 reps  - Supine 90/90 Alternating Toe Touch  - 1 x daily - 7 x weekly - 2 sets - 10 reps  - Full Plank  - 1 x daily - 7 x weekly - 1 sets - 3 reps - 30-45 hold  - Supine Bridge with Leg Extension  - 1 x daily - 7 x weekly - 2 sets - 10 reps - 5 hold  - Bird Dog  - 1 x daily - 7 x weekly - 2 sets - 10 reps  - Supine Dead Bug with Leg Extension  - 1 x daily - 7 x weekly - 2 sets - 10 reps  Subjective:     History of Present Condition     Date of onset:  08/23/2016    Date is approximation?: yes    Explanation:  Patient reports onset of symptoms in 2018.    History of Present Condition/Chief Complaint:  Mr. Szczesniak is a 43 y.o. right hand dominant adult (male to male transition in process) who presents to outpatient physical therapy with chronic low back pressure, tightness and pain that began in 2018.  Patient cannot remember any specific traumatic mechanism of injury that may have been the result of his symptoms, but he does report that he broke his right leg 2x when he was younger.  The first one was when he was a baby.  The second time he broke it falling off the couch when he was 8.  He can't remember if it was his femur or tibia and he hasn't had a recent x-ray.  Subjective:  Mr. Boze reports that he's just about recovered from his recent fall.  Generally speaking, he thinks his back is a lot better.  It still bothers him sometimes.  He is still struggling with doing his planks.  He is doing his exercises consistently.  Pain:     Current pain rating:  5  Location:  Right low back and radiating down the anterior aspect of his right lower extremity    Quality:  Aching    Progression:  Improved      Objective:       Palpation     Right Lumbar   Hypertonic in the erector spinae, lumbar paraspinals and quadratus lumborum.    Joint Mobility  Lumbar  Lumbar Joint Mobility   Hypomobile at L4 and L5.   Pain at L4 and L5.     Strength  Right Hip   Planes of Motion   Flexion: 5  Extension: 5  Abduction: 4+  External rotation: 4  Internal rotation: 5                                  I attest that I have reviewed the above information.  Signed: Willistine Hartigan, PT  10/12/2023 12:14 PM

## 2023-10-13 NOTE — Unmapped (Signed)
 Addended by: Kalis Friese on: 10/13/2023 10:03 AM     Modules accepted: Orders

## 2023-10-16 MED ORDER — DEXTROAMPHETAMINE-AMPHETAMINE 20 MG TABLET
ORAL_TABLET | Freq: Every day | ORAL | 0 refills | 30.00 days | Status: CP
Start: 2023-10-16 — End: 2023-11-15
  Filled 2023-10-18: qty 30, 30d supply, fill #0

## 2023-10-16 NOTE — Unmapped (Signed)
 Department of Anesthesiology  [facility]  [address], [address]  [place], [place] [address]  [phone]    Chronic Pain Follow-Up Note  1. Myofascial pain    2. TOS (thoracic outlet syndrome)    3. Chronic pain syndrome      Assessment and Plan  Vincent Waters is a 43 y.o. being [facility] for evaluation of ongoing pain after TOS decompression/pectoralis minor release on the right for thoracic outlet syndrome by Dr. Terral Ferrari in May 2023. He was first seen by our clinic in March 2023 for diagnostic injections. His range of motion has an increased postoperatively, but still has tight, spasming pain in the right neck and shoulder. He is currently weaning off benzodiazepines, discontinued opioid medications in late 2023 which he was chronically on for 3 years, and inquires about optimizing medications and procedures to improve his baseline pain at this time. His primary pain generator includes myofascial pain post-TOS decompression. He has other medical history that includes anxiety, scleroderma, migraines, autoimmune autonomic neuropathy, asthma. Also follows with sports medicine and has undergone injections there as well.  We have performed TPI and spinal accessory nerve block/pulsed RFA with benefit.     April 2025  The patient was last seen in January, at which point the patient endorsed unchanged pain related to his thoracic outlet syndrome with excellent relief following his most recent pulsed RFA. He had new pain related to a severe thunderclap headache that had been occurring for the past several months. He stated pain was severe and unlike his typical migraines, coming on without warning and occurring up to 7 times a day. Gabapentin was beneficial for his TOS symptoms.    Today, patient presents for chronic pain follow up related to TOS and myofacial pain. He had right spinal accessory nerve pulsed RF and TPIs on 07/27/23, he endorses no pain relief at all. He had significant relief with previous RF on 12/2022. He states his shoulder was locked up for two weeks after the last injection, he feels RF and TPIS on the same days was too much for him. He has been working with PT/ dry needling with improvement. He also had fairly new dizziness for the last about two weeks, caused another fall and visited his PCP where he was referred to cardiology for follow up. He also sees [facility] which they recommend wisdom teeth removal. He notes he was not using gabapentin since his pain was under control, then restarted recently taking twice with notable benefit. We discussed he could take up to three times daily for maximal effect, which he agrees. He also notes gabapentin helps with headaches when he takes it TID.     Thoracic Outlet Syndrome - Myofascial pain - Chronic Pain Syndrome   Chronic, ongoing  - Continue following with PT  - Continue gabapentin 600 mg oral solution TID, refilled (currently taking BID)  - Continue clonazepam taper with PCP    Considerations for the future:  - Repeat TPIs PRN  - Continue follow up with [facility]   - Consider initiation of Butrans/buprenorphine product after weaning off of benzodiazepines and pain psych evaluation for COM should conservative measures fail, but hoping to avoid if possible  - Consider up titration of gabapentin oral solution or rotation to Lyrica     Return in about 3 months (around 01/16/2024).    Requested Prescriptions     Signed Prescriptions Disp Refills    gabapentin (NEURONTIN) 250  mg/5 mL oral solution 1080 mL 2     Sig: Take 12 mL (600 mg total) by mouth Three (3) times a day.     No orders of the defined types were placed in this encounter.      HPI:  Vincent Waters is seen in consultation at the request of Dr. Terral Ferrari for evaluation and recommendations regarding His pain.     The patient was last seen in January. Since last visit, the patient has underwent trigger point injections of right cervical paraspinal, trapezius, levator scapulae, and rhomboids with our clinic on 07/27/23. The patient has followed with Family Medicine, PT, Psychiatry and GI.    Today, patient presents for chronic pain follow up related to TOS and myofacial pain. He had right spinal accessory nerve pulsed RF and TPIs on 07/27/23, he endorses no pain relief at all. He had significant relief with the previous RF on 12/2022. He states his shoulder was locked up for two weeks after the last injection, he feels RF and TPIS on the same days was too much for him. He has been working with PT/ dry needling with improvement. He also had fairly new dizziness for the last about two weeks, caused another fall and visited his PCP where he was referred to cardiology for follow up. He also sees [facility] which they recommend wisdom teeth removal. He notes he was not using gabapentin since his pain was under control, then restarted recently taking twice with notable benefit. He also notes gabapentin helps with headaches when he takes it TID.     Current Medications:  Gabapentin 600 mg TID- takes BID  Voltaren gel     Current view: Showing all answers           [facility] Return Patient Questionnaire       Question 10/17/2023  8:23 AM EDT - Vincent Waters by Patient    What is the reason for your visit? Medication Refill     Discuss medication changes    Date of onset of your pain:       Please rate your pain at its WORST in the past month. 8    Please rate your pain at its LEAST in the past month. 3    Please rate your pain as it is RIGHT NOW. 5    Please rate your pain on AVERAGE in the past month. 7    Please circle the location of your pain.       Please select the words that describe your pain. Aching     Dull     Pulling     Sharp     shooting     Stabbing Tender Throbbing    How often do you have pain? Sometimes    When is your pain the worst? Evenings    Which of the following have been negatively affected by your pain? Enjoyment of life     Recreational activities     Sleep    Since your last visit:     Have you had any of the following? Primary Care Visit    Do you have any new pain you would like to discuss with your doctor? Yes    How has your pain changed? Stayed the same    Are you currently taking any blood-thinners or anticoagulants? No    If you are on Pain Medication - Are you having any of the following? Dry mouth     I  am stable on my current medication regime    If you have had a procedure since your last visit, how much pain relief was obtained? 60%    If you have had a procedure since your last visit, were there any complications?       General: Weight Loss/Gain     Snoring     Night Sweats    Cardiovascular: Heart Palpitations    Gastrointestinal - (Intestinal): Stomach Pain     Diarrhea     Constipation     Gastric Reflux    Skin: Rash     Hives     Redness     Dryness    Endocrine (Hormonal System): Increased Sweating     Excess Thirst     Excess Urination    Musculoskeletal System - (Muscles, Joints and Coverings): Spasms/Spasticity/Cramps     Back pain    Neurologic: Dizziness/Vertigo     Headaches/Migraines     Numbness/Tingling    Psychiatric: Anxiety          [facility] Patient-Entered Hpi Selection Questionnaire       Question 10/17/2023  8:24 AM EDT - Vincent Waters by Patient    What is the primary reason for your visit? Other    What is the primary reason for your visit? Other    Please describe your symptoms. Shoulder pain    Have you had these symptoms before? Yes    How long have you been having these symptoms? For more than a month    Please list any medications you are currently taking for this condition.       Please describe any probable cause for these symptoms.  Surgery           Patient denies homicidal/suicidal ideation.    Previous Medication Trials:  NSAIDS- ibuprofen  Antidepressants-   Anticonvulsant- gabapentin  Muscle relaxants- flexeril, baclofen  Topicals-voltaren gel  Short-acting opiates-percocet   Long-acting opiates-   Anxiolytics-   Other-     Previous Interventions  Medications, PT, surgery, injections with sports med  TOS decompression/PMR 11/16/21  TPI-05/10/22-very helpful.  Have repeated several times since  PT-2023  Left GTB CSI-sports med 05/27/22  Spinal accessory nerve block-11/04/22  Spinal accessory nerve block and pulsed RFA-12/29/22- had significant relief, 07/27/23- no relief    Allergies as of 10/17/2023 - Reviewed 10/12/2023   Allergen Reaction Noted    Latex Rash and Hives 09/16/2016    Penicillins Anaphylaxis 09/16/2016    Shellfish containing products Anaphylaxis 01/23/2017    Other Rash 12/16/2020    Trileptal [oxcarbazepine] Rash 07/19/2023      Current Outpatient Medications   Medication Sig Dispense Refill    albuterol 2.5 mg /3 mL (0.083 %) nebulizer solution Inhale 3 mL (2.5 mg total) by nebulization every six (6) hours as needed for wheezing or shortness of breath. 90 mL 1    azelastine (ASTELIN) 137 mcg (0.1 %) nasal spray Instill 2 sprays into each nostril two (2) times a day as needed for rhinitis. 30 mL 1    budesonide-formoterol (SYMBICORT) 160-4.5 mcg/actuation inhaler Inhale 1 puff by mouth Two (2) times a day. 10.2 g 5    cetirizine (ZYRTEC) 10 MG tablet Take 1 tablet (10 mg total) by mouth daily. 30 tablet 12    clonazePAM (KLONOPIN) 0.5 MG tablet Take 0.5 tablets (0.25 mg total) by mouth two (2) times a day as needed for anxiety. 30 tablet 5    dextroamphetamine-amphetamine (ADDERALL) 20 mg tablet Take  1 tablet (20 mg total) by mouth daily. 30 tablet 0    dextroamphetamine-amphetamine (ADDERALL) 20 mg tablet Take 1 tablet (20 mg total) by mouth daily. 30 tablet 0    dextroamphetamine-amphetamine (ADDERALL) 20 mg tablet Take 1 tablet (20 mg total) by mouth daily. 30 tablet 0    diclofenac sodium (VOLTAREN) 1 % gel Apply 2 g topically four (4) times a day. 100 g 0    emtricitabine-tenofovir alafen (DESCOVY) 200-25 mg tablet Take 1 tablet by mouth daily. 90 tablet 0    EPINEPHrine (EPIPEN) 0.3 mg/0.3 mL injection Inject 0.3 mL (0.3 mg total) under the skin once for 1 dose. 2 each 0    gabapentin (NEURONTIN) 250 mg/5 mL oral solution Take 12 mL (600 mg total) by mouth Three (3) times a day. 1080 mL 2    inhalational spacing device Spcr Use as directed with albuterol and symbicort 1 each 1    ipratropium (ATROVENT) 42 mcg (0.06 %) nasal spray 1-2 sprays in each nostril as needed for drainage, up to 4 time daily. 15 mL 2    metoclopramide (REGLAN) 10 MG tablet Take 1/2 tablet (5 mg total) by mouth Three (3) times a day with a meal. 135 tablet 3    nebulizer and compressor (COMP-AIR NEBULIZER COMPRESSOR) Devi 1 each by Miscellaneous route nightly as needed. 1 each 0    needle, disp, 25 T[place]ha (BD REGULAR BEVEL NEEDLES) 25 Serin x 5/8 Ndle For subcutaneous hormone injection. 25 each 0    nifedipine 0.3% lidocaine 1.5% in petrolatum ointment Apply a pea-size amount to anal area 3 times a day 100 g 0    omalizumab (XOLAIR) 300 mg/2 mL auto-injector Inject the contents of 1 pen (300 mg) under the skin every fourteen (14) days. 4 mL 11    omalizumab 75 mg/0.5 mL AtIn Inject the contents of 1 pen (75 mg) under the skin every fourteen (14) days. 1 mL 11    pantoprazole (PROTONIX) 40 MG tablet Take 1 tablet (40 mg total) by mouth daily. 90 tablet 3    propranolol (INDERAL) 10 MG tablet Take 1 tablet (10 mg total) by mouth two (2) times a day. 60 tablet 11    prucalopride (MOTEGRITY) 2 mg Tab Take 1 tablet (2 mg total) by mouth daily. 90 tablet 3    safety needles (BD ECLIPSE) 25 Mirah x 1 Ndle use for testosterone 12 each 0    safety needles (BD SAFETYGLIDE NEEDLE) 18 Kimbery x 1 1/2 Ndle For drawing hormone injection 25 each 0    syringe, disposable, (EASY TOUCH LUER LOCK SYRINGE) 1 mL Syrg Use for weekly hormone injection. 4 each 0    syringe, disposable, (EASY TOUCH LUER LOCK SYRINGE) 1 mL Syrg Use for testosterone 12 each 0    syringe, disposable, 2.5 mL Syrg Use weekly 30 Syringe 2    testosterone cypionate (DEPOTESTOTERONE CYPIONATE) 200 mg/mL injection Inject 0.2 mL (40 mg total) into the muscle once a week. 12 mL 0    triamcinolone (KENALOG) 0.1 % cream Apply topically Two (2) times a day. 15 g 0     Current Facility-Administered Medications   Medication Dose Route Frequency Provider Last Rate Last Admin    omalizumab Anitra Ket) injection 300 mg  300 mg Subcutaneous Q28 Days [name], [name], [name]   300 mg at 05/24/22 1138     Imaging/Tests:   Lab Results   Component Value Date    PLT 316 01/19/2023  Lab Results   Component Value Date    CREATININE 0.91 01/19/2023     Lab Results   Component Value Date    A1C 4.9 08/03/2017     Lab Results   Component Value Date    ALKPHOS 110 01/19/2023    BILITOT 0.5 01/19/2023    BILIDIR 0.20 01/19/2023    PROT 7.9 01/19/2023    ALBUMIN 4.3 01/19/2023    ALT 10 01/19/2023    AST 18 01/19/2023     Review Of Systems  See questionnaires     PHYSICAL EXAM:  BP 118/80  - Pulse 69  - Resp 16  - Ht 162.6 cm (5' 4)  - Wt 69.4 kg (153 lb)  - LMP 07/20/2017 (Exact Date)  - SpO2 98%  - BMI 26.26 kg/m??     Wt Readings from Last 3 Encounters:   10/17/23 69.4 kg (153 lb)   09/06/23 69.4 kg (153 lb)   08/31/23 71.2 kg (157 lb)     GENERAL:  The patient is well developed, well-nourished, and appears to be in no apparent distress.   HEAD/NECK:    Normocephalic/atraumatic. clear sclera, pupils not pinpoint  CV:  Warm and well-perfused.  LUNGS:   Normal work of breathing, no supplemental O2  EXTREMITIES:  No clubbing, cyanosis noted.  NEUROLOGIC:    The patient is alert and oriented, speech fluent, normal language.   MUSCULOSKELETAL:    Motor function  preserved.    GAIT:  The patient rises from a seated position with no difficulty and ambulates with nonantalgic gait without the assistance of a walking aid.   SKIN:   No obvious rashes, lesions, or erythema.  PSY:   Appropriate affect. No overt pain behaviors. No evidence of psychomotor retardation or agitation, no signs of intoxication.     We are delivering comprehensive, continuous, longitudinal care for this patient with chronic pain.

## 2023-10-17 ENCOUNTER — Ambulatory Visit: Admit: 2023-10-17 | Discharge: 2023-10-18 | Payer: BLUE CROSS/BLUE SHIELD

## 2023-10-17 DIAGNOSIS — M7918 Myalgia, other site: Principal | ICD-10-CM

## 2023-10-17 DIAGNOSIS — G540 Brachial plexus disorders: Principal | ICD-10-CM

## 2023-10-17 DIAGNOSIS — G894 Chronic pain syndrome: Principal | ICD-10-CM

## 2023-10-17 MED ORDER — GABAPENTIN 250 MG/5 ML ORAL SOLUTION
Freq: Three times a day (TID) | ORAL | 2 refills | 30.00000 days | Status: CP
Start: 2023-10-17 — End: ?
  Filled 2023-10-18: 30d supply, fill #0

## 2023-10-17 NOTE — Unmapped (Signed)
 Patient declined printed AVS but can review on MyChart. Patient voiced understanding.

## 2023-10-17 NOTE — Unmapped (Signed)
 It was good to see you today.    I have refilled your medications with no changes.    We will see you in 3 months, or sooner if needed.

## 2023-10-18 NOTE — Unmapped (Signed)
 [facility] THERAPY SERVICES AT [facility] [place]  OUTPATIENT PHYSICAL THERAPY  10/18/2023  Note Type: Treatment Note       Patient Name: Vincent Waters  Date of Birth:Mar 25, 1981  Diagnosis:   Encounter Diagnosis   Name Primary?    Chronic right-sided low back pain without sciatica Yes     Referring MD:  Reeves Canter,*     Date of Onset of Impairment-08/23/2016  Date PT Care Plan Established or Reviewed-08/24/2023  Date PT Treatment Started-08/24/2023   Visit Count: 8  Plan of Care Effective Date:          Assessment/Plan:    Assessment  Assessment details:    It appears that the right hip/core strengthening is working to keep Izzah's low back symptoms low.  Progressing right hip/core strength within tolerance.  He is tolerating it well, but he needs some verbal cuing for correct exercise performance.  Written instructions given for fire hydrants (L6 band given for clams and L4 for fire hydrants).  He committed to performing them consistently as part of his home exercise program. All questions answered.  This patient requires skilled physical therapy services to address the outlined impairments in order to return to his desired level of function.           Impairments: pain, muscular restrictions, hypertonicity, core weakness, impaired ADLs, joint restriction, decreased strength and gait deviation                      Therapy Goals      Goals:      Goals:  In 12 weeks:  Goal #1: Patient will be independent with progressively monitored home exercise program.   Baseline/Current: Patient is unfamiliar with home exercise program.     Goal #2: Patient will demonstrate lumbar ROM within normal limits without pain in order to perform daily functional activities such as bending to pick an object off the ground, twisting to reach into the patient's backseat or bend backwards in order to lift an object overhead.   Baseline/Current: Patient has painful lumbar ROM (see ROM testing).     Goal #3: Patient will demonstrate right hip strength within normal limits without pain in order to allow for functional tasks such as symmetrical walking, squatting and negotiating stairs.   Baseline/Current: Patient has limited right hip strength (see manual muscle testing).      Goal #4: Patient will be able to lift at least 75 lb from floor to waist with good lifting mechanics in order to demonstrate good core and trunk strength.   Baseline/Current: Patient has pain and difficulty lifting at present.    Goal #5: Patient will report improvement in symptoms while sitting and ability to sit up to 1 hour with minimal symptoms.  Baseline/Current: Patient has difficulty sitting at present.    Goal #6: Patient's FOTO score will improve from 34 at initial evaluation to >/= 53 at discharge in order to demonstrate a significant improvement in self-reported outcome measures that directly result from his course of physical therapy.   Baseline/Current: FOTO = 34    Plan    Therapy options: will be seen for skilled physical therapy services    Planned therapy interventions: 20560, 20561-Dry Needling 1-2, 3+ areas, 97010-Cold Packs/Hot Packs, 97012-Mechanical Traction, 97032, G0283-Electrical Stimulation (unattended, attended), 97110-Therapeutic Exercises, 97112-Neuromuscular Re-education, 97116-Gait Training, 97140-Manual Therapy, 97164-Re-evaluation (PT) and 97530-Therapeutic Activities      Frequency: 1x week    Duration in weeks: 12  Education provided to: patient.    Education provided: Surveyor, minerals, HEP and Treatment options and plan    Education results: verbalized good understanding, needs reinforcement, demonstrates understanding and needs further instruction.    Communication/Consultation: N/A.    Next visit plan:        Manual therapy/TDN to calm lumbar musculature, right hip/core/trunk strengthening.    Total Session Time: 40    Treatment rendered today:      Therapeutic Exercise: 40 minutes  - Patient Education: Plan of Care, Home Exercise Program  - Side Lying Clam With Band: 2 x [12 x 3] (L6)  - Side Lying Hip Abduction: 2 x 12, 4 lb  - Fire Hydrants: 2 x 10, bilateral (L4)  - Supine TA/Pelvic Tilt: 10 x   - Supine TA/Pelvic Tilt/March: 10 x   - Supine TA/Pelvic Tilt/90-90 Heel Taps: 2 x 10, bilateral, alternating  - Supine TA/Dead Bug: 2 x 7, bilateral alternating  - Bird Dog: 2 x 10, bilateral, alternating  - Front Plank: 3 x 20    Plan details: Access Code: [email or web address]  URL: [email or web address]  Date: 10/18/2023  Prepared by: Irineo Manns    Exercises  - Supine Posterior Pelvic Tilt  - 1 x daily - 7 x weekly - 1 sets - 20 reps - 5 hold  - Sidelying Thoracolumbar Mobilisation with Towel Roll  - 2 x daily - 7 x weekly - 1 sets - 1 reps - 5 minutes hold  - Clamshell with Resistance (Mirrored)  - 1 x daily - 7 x weekly - 2 sets - 10 reps - 3 hold  - Sidelying Hip Abduction (Mirrored)  - 1 x daily - 7 x weekly - 2 sets - 10 reps  - Hooklying Transversus Abdominis Palpation  - 1 x daily - 7 x weekly - 1 sets - 15 reps - 5 hold  - Supine March  - 1 x daily - 7 x weekly - 2 sets - 10 reps  - Supine 90/90 Alternating Toe Touch  - 1 x daily - 7 x weekly - 2 sets - 10 reps  - Full Plank  - 1 x daily - 7 x weekly - 1 sets - 3 reps - 30-45 hold  - Supine Bridge with Leg Extension  - 1 x daily - 7 x weekly - 2 sets - 10 reps - 5 hold  - Bird Dog  - 1 x daily - 7 x weekly - 2 sets - 10 reps  - Supine Dead Bug with Leg Extension  - 1 x daily - 7 x weekly - 2 sets - 10 reps  - Quadruped Hip Abduction with Resistance Loop  - 1 x daily - 7 x weekly - 2 sets - 10 reps      Subjective:     History of Present Condition     Date of onset:  08/23/2016    Date is approximation?: yes    Explanation:  Patient reports onset of symptoms in 2018.    History of Present Condition/Chief Complaint:  Mr. Keesling is a 43 y.o. right hand dominant adult (male to male transition in process) who presents to outpatient physical therapy with chronic low back pressure, tightness and pain that began in 2018.  Patient cannot remember any specific traumatic mechanism of injury that may have been the result of his symptoms, but he does report that he broke his right leg 2x when he was younger.  The first one was  when he was a baby.  The second time he broke it falling off the couch when he was 8.  He can't remember if it was his femur or tibia and he hasn't had a recent x-ray.  Subjective:  Mr. Oneall reports that he went to the pain clinic and the doctor recommended he start to use a single point cane when he feels dizzy.  He is doing his exercises consistently.  Pain:     Current pain rating:  2  Location:  Right low back and radiating down the anterior aspect of his right lower extremity    Quality:  Aching    Progression:  Improved      Objective:       Palpation     Right Lumbar   Hypertonic in the erector spinae, lumbar paraspinals and quadratus lumborum.    Joint Mobility  Lumbar  Lumbar Joint Mobility   Hypomobile at L4 and L5.   Pain at L4 and L5.                                   I attest that I have reviewed the above information.  Signed: Willistine Hartigan, PT  10/18/2023 10:52 AM

## 2023-10-19 ENCOUNTER — Ambulatory Visit: Admit: 2023-10-19 | Discharge: 2023-10-19 | Payer: BLUE CROSS/BLUE SHIELD

## 2023-10-19 ENCOUNTER — Ambulatory Visit
Admit: 2023-10-19 | Discharge: 2023-10-19 | Payer: BLUE CROSS/BLUE SHIELD | Attending: Student in an Organized Health Care Education/Training Program | Primary: Student in an Organized Health Care Education/Training Program

## 2023-10-19 DIAGNOSIS — N898 Other specified noninflammatory disorders of vagina: Principal | ICD-10-CM

## 2023-10-19 DIAGNOSIS — R209 Unspecified disturbances of skin sensation: Principal | ICD-10-CM

## 2023-10-19 DIAGNOSIS — D367 Benign neoplasm of other specified sites: Principal | ICD-10-CM

## 2023-10-19 DIAGNOSIS — M7989 Other specified soft tissue disorders: Principal | ICD-10-CM

## 2023-10-19 NOTE — Unmapped (Signed)
 [facility] Skin/Procedure Clinic Consult Note      SUBJECTIVE:         History of Present Illness    Vincent Waters is a 43 y.o. adult, who is seen in consultation at the request of Reeves Canter, MD for the evaluation of right forearm cyst.  Patient reports: cyst in R forearm for the past 3 years. Initially looked like mosquito bite, but wasn't itchy. Has gotten red and inflamed in the past. A couple of years ago had a cyst removed from sternal region.     Allergies, Medications, Past Medical, Surgical, and Family History  I personally reviewed patient's allergies, meds, past medical, surgical, and family histories.    Social History  Smoking status: none    Review of Systems    Multi-point ROS was performed and was negative, unless mentioned above in the HPI       OBJECTIVE:     Physical Exam  Vitals: BP 126/78 (BP Site: L Arm, BP Position: Sitting, BP Cuff Size: Medium)  - Pulse 78  - Temp 36.6 ??C (97.9 ??F) (Temporal)  - Ht 162.6 cm (5' 4)  - Wt 69.8 kg (153 lb 12.8 oz)  - LMP 07/20/2017 (Exact Date)  - BMI 26.40 kg/m??   General Apperance: no obvious deformities, comfortable and no acute distress  Head / Face: no deformities or dysmorphism  Skin: examination of the R arm notable for:  2-42mm firm cyst just below skin on R upper dorsal forearm, no overlying skin changes, tattoo overlying        Diagnostic Data  Labs / Tests:  Available labs/test were personally reviewed  Imaging:  Radiology studies were personally reviewed          ASSESSMENT / PLAN:          43 y.o. 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 Rayala, MD    Pre-operative Diagnosis: Skin neoplasm of uncertain behavior  Post-operative Diagnosis: same    Location(s): R upper dorsal forearm    Allergies:  reviewed allergy section in the chart    Consent:    I reviewed the risks (bleeding, infection, scarring, poor cosmetic result), benefits (accurate diagnosis of lesion, removal of lesion), and alternatives (observation) to proposed procedure.  I answered all of the patient's questions and addressed all concerns.  Patient agreed to proceed.  Verbal and written consent obtained; form scanned to chart.      Time-out:  Performed immediately prior to procedure      Anesthesia: Lidocaine 1% with epinephrine (1:100,000) w    Procedure Details:   The lesion and surrounding area were prepped with chlorhexidine.  Using G#27 1-1/2 needle, 2 mL of 1% lidocaine w/epi was injected to dermal area causing lesion to elevate slightly.  Skin on both sides of biopsy site stretched away from the site, perpendicular to the [employer].  4mm area of lesion sharply excised (up to the subcutaneous tissue) using 4mm punch instrument, pickups, and iris scissors. Debrided fascia underlying mass. Prolene 3-0 x  1 simple interrupted suture was used for skin closure.  Sterile dressing was then applied.  The patient tolerated the procedure well.      Specimen(s): The specimen was sent for pathologic examination.      EBL: <54mL    Condition: Stable    Complications:  None    Post-operative Instructions and Plan:  Patient was instructed to keep the wound dry for 24 hours.  If bleeding occurs, apply pressure x 15-50mins or compression (eg, Ace wraps), elevate, &/or apply ice.  If bleeding persists, call clinic for further instructions.  Apply petrolatum twice daily to keep wound moist.  Keep wound covered with dressing, esp. at night to avoid drying.  Call or RTC if signs of wound infection ensue (pain, purulent drainage, significant crusting, advancing redness, increasing warmth).  Recommended that the patient use ice, OTC acetaminophen, and OTC ibuprofen as needed for pain.   I will notify patient of biopsy results (electronically or by mail or phone) and any further recommendations (if any) based on pathology findings.    CPT:    Forearm, subcutaneous, under 3cm - [address]

## 2023-10-19 NOTE — Unmapped (Signed)
 Addended byBarrie Lie on: 10/19/2023 01:13 PM     Modules accepted: Orders

## 2023-10-19 NOTE — Unmapped (Signed)
 Hi Rosmary,    It was great to see you today. I look forward to seeing you at your next visit. In the meantime, please feel free to message me on mychart with any non-time-sensitive questions or requests. For any time-sensitive matters (including completion of medical clearance, school/work physical or other forms), please call [phone] to request a phone, video, or in-person visit with me or one of my colleagues.     Additionally, [facility] Medicine has an Urgent Care at our [facility] location! Call [phone] for same-day appointments, available Monday-Friday 7am-8pm; Saturday and Sunday 12pm-5pm. If you think you are having an emergency, please call 911 or go to your nearest emergency department.     If you ever need urgent help with your mental health, including for any thoughts of self-harm or suicide, please call the [place] mental health crisis line at [phone], or the national suicide prevention hotline at 713-525-3688.     Take care,    Valdene Garret, MD, MPH (he/him)  [facility] at [facility]  [address]   [place], [place] [address]  Phone: [phone]  Fax: [phone]

## 2023-10-19 NOTE — Unmapped (Signed)
 [facility] at [facility]  Established Patient Clinic Note    Assessment/Plan:   Vincent Waters is a 43 y.o.adult who presents for follow-up.    Problem List Items Addressed This Visit    None  Visit Diagnoses         Vaginal discharge    -  Primary    Relevant Orders    Vaginitis Molecular Panel    Chlamydia/Gonorrhoeae NAA          - Current meds:   Current Outpatient Medications:     albuterol 2.5 mg /3 mL (0.083 %) nebulizer solution, Inhale 3 mL (2.5 mg total) by nebulization every six (6) hours as needed for wheezing or shortness of breath., Disp: 90 mL, Rfl: 1    azelastine (ASTELIN) 137 mcg (0.1 %) nasal spray, Instill 2 sprays into each nostril two (2) times a day as needed for rhinitis., Disp: 30 mL, Rfl: 1    budesonide-formoterol (SYMBICORT) 160-4.5 mcg/actuation inhaler, Inhale 1 puff by mouth Two (2) times a day., Disp: 10.2 g, Rfl: 5    cetirizine (ZYRTEC) 10 MG tablet, Take 1 tablet (10 mg total) by mouth daily., Disp: 30 tablet, Rfl: 12    clonazePAM (KLONOPIN) 0.5 MG tablet, Take 0.5 tablets (0.25 mg total) by mouth two (2) times a day as needed for anxiety., Disp: 30 tablet, Rfl: 5    dextroamphetamine-amphetamine (ADDERALL) 20 mg tablet, Take 1 tablet (20 mg total) by mouth daily., Disp: 30 tablet, Rfl: 0    dextroamphetamine-amphetamine (ADDERALL) 20 mg tablet, Take 1 tablet (20 mg total) by mouth daily., Disp: 30 tablet, Rfl: 0    diclofenac sodium (VOLTAREN) 1 % gel, Apply 2 g topically four (4) times a day., Disp: 100 g, Rfl: 0    emtricitabine-tenofovir alafen (DESCOVY) 200-25 mg tablet, Take 1 tablet by mouth daily., Disp: 90 tablet, Rfl: 0    EPINEPHrine (EPIPEN) 0.3 mg/0.3 mL injection, Inject 0.3 mL (0.3 mg total) under the skin once for 1 dose., Disp: 2 each, Rfl: 0    gabapentin (NEURONTIN) 250 mg/5 mL oral solution, Take 12 mL (600 mg total) by mouth Three (3) times a day., Disp: 1080 mL, Rfl: 2    inhalational spacing device Spcr, Use as directed with albuterol and symbicort, Disp: 1 each, Rfl: 1    ipratropium (ATROVENT) 42 mcg (0.06 %) nasal spray, 1-2 sprays in each nostril as needed for drainage, up to 4 time daily., Disp: 15 mL, Rfl: 2    metoclopramide (REGLAN) 10 MG tablet, Take 1/2 tablet (5 mg total) by mouth Three (3) times a day with a meal., Disp: 135 tablet, Rfl: 3    nebulizer and compressor (COMP-AIR NEBULIZER COMPRESSOR) Devi, 1 each by Miscellaneous route nightly as needed., Disp: 1 each, Rfl: 0    needle, disp, 25 Dinna (BD REGULAR BEVEL NEEDLES) 25 Shekira x 5/8 Ndle, For subcutaneous hormone injection., Disp: 25 each, Rfl: 0    nifedipine 0.3% lidocaine 1.5% in petrolatum ointment, Apply a pea-size amount to anal area 3 times a day, Disp: 100 g, Rfl: 0    omalizumab (XOLAIR) 300 mg/2 mL auto-injector, Inject the contents of 1 pen (300 mg) under the skin every fourteen (14) days., Disp: 4 mL, Rfl: 11    omalizumab 75 mg/0.5 mL AtIn, Inject the contents of 1 pen (75 mg) under the skin every fourteen (14) days., Disp: 1 mL, Rfl: 11    pantoprazole (PROTONIX) 40 MG tablet, Take 1 tablet (40 mg  total) by mouth daily., Disp: 90 tablet, Rfl: 3    propranolol (INDERAL) 10 MG tablet, Take 1 tablet (10 mg total) by mouth two (2) times a day., Disp: 60 tablet, Rfl: 11    prucalopride (MOTEGRITY) 2 mg Tab, Take 1 tablet (2 mg total) by mouth daily., Disp: 90 tablet, Rfl: 3    safety needles (BD ECLIPSE) 25 Kasidee x 1 Ndle, use for testosterone, Disp: 12 each, Rfl: 0    safety needles (BD SAFETYGLIDE NEEDLE) 18 Krishawna x 1 1/2 Ndle, For drawing hormone injection, Disp: 25 each, Rfl: 0    syringe, disposable, (EASY TOUCH LUER LOCK SYRINGE) 1 mL Syrg, Use for weekly hormone injection., Disp: 4 each, Rfl: 0    syringe, disposable, (EASY TOUCH LUER LOCK SYRINGE) 1 mL Syrg, Use for testosterone, Disp: 12 each, Rfl: 0    syringe, disposable, 2.5 mL Syrg, Use weekly, Disp: 30 Syringe, Rfl: 2    testosterone cypionate (DEPOTESTOTERONE CYPIONATE) 200 mg/mL injection, Inject 0.2 mL (40 mg total) into the muscle once a week., Disp: 12 mL, Rfl: 0    triamcinolone (KENALOG) 0.1 % cream, Apply topically Two (2) times a day., Disp: 15 g, Rfl: 0    Current Facility-Administered Medications:     omalizumab (XOLAIR) injection 300 mg, 300 mg, Subcutaneous, Q28 Days, [name], [name], [name], 300 mg at 05/24/22 1138  Assessment & Plan  Vaginal discharge  Endorses increased yellowish vaginal discharge since yesterday evening. Denies fever, chills, or other systemic symptoms. Took one dose of clindamycin gel today (previously prescribed for recurrent BV infection). Endorses four male sexual partners over the past month, two of whom are new; has engaged with condom-less vaginal intercourse with all but one of these partners. Has been taking PrEP as prescribed. After discussion of eval/management options, pt would like to proceed with vaginitis panel and urine GC/CT. If all negative, would prefer to treat with vaginal clindamycin given similar symptoms to prior confirmed BV episodes, which responded well to vaginal clindamycin previously. Return precautions provided, pt verbalizes understanding and has no further questions or concerns at this time.   Orders:    Vaginitis Molecular Panel    Chlamydia/Gonorrhoeae NAA    HEALTH MAINTENANCE ITEMS STILL DUE:  There are no preventive care reminders to display for this patient.  Follow-up: No follow-ups on file.    Future Appointments   Date Time Provider Department Center   10/20/2023 11:00 AM [facility] ECHO OUTPATIENT 2 [facility]L [place] [facility]   10/24/2023 10:00 AM Edie Goon, FNP [facility]   10/26/2023 10:05 AM FAMMED SPECIALTY NURSE FMAYCK [facility]   10/26/2023 11:00 AM Palmira Bock, Vincent Waters CARDSTHPOINT [facility]   10/30/2023 12:15 PM Myer, Rumalda Counter, PT PTOT [facility]   11/02/2023 11:20 AM Reeves Canter, Vincent Waters UNCFMFAYS [facility]   11/06/2023  9:30 AM Stem, Artice Last, Vincent Waters SURGI1UMH [facility]   11/09/2023 10:30 AM Andrea Banda, PMHNP OPTCVilcom [facility]   11/10/2023 11:00 AM Charletta Cons, PT PTOT [facility]   11/16/2023  9:00 AM Abbey Hobby, Vincent Waters HVVSURMMNT [facility]   11/17/2023  8:45 AM Charletta Cons, PT PTOT [facility]   12/12/2023  1:30 PM UNCW FLUORO RM 8 IFLUOUW [place]   12/12/2023  2:30 PM [facility] MRI RM 6 [facility] [place]   01/01/2024  8:00 AM Caid, Eusebio High, Vincent Waters UNCALLERGET [facility]   01/01/2024  9:20 AM MEDDDN MOTILITY NURSE UNCGIMEDET [facility]   01/04/2024  8:45  AM Kimple, Adam [place], Vincent Waters UNCOTOMEWVIL [facility]   01/05/2024  8:00 AM Lobonc, Jerlene Moody, Vincent Waters ANESPAINMRKT [facility]     Subjective   Vincent Waters is a 43 y.o. adult  coming to clinic today for follow-up.    Chief Complaint   Patient presents with    BV     BV symptoms since last night. Pt wants clindamycin     HPI: see above for more information.     I have reviewed the problem list, medications, and allergies and have updated/reconciled them if needed.    Dauna  reports that he has never smoked. He has never been exposed to tobacco smoke. He has never used smokeless tobacco.  Health Maintenance   Topic Date Due    DTaP/Tdap/Td Vaccines (2 - Td or Tdap) 08/19/2027    Lipid Screening  12/26/2027    Pneumococcal Vaccine 0-49  Completed    Hepatitis C Screen  Completed    COVID-19 Vaccine  Completed    Influenza Vaccine  Completed     Objective     VITALS: BP 131/88 (BP Site: L Arm, BP Position: Sitting, BP Cuff Size: Large)  - Pulse 81  - Temp 36.8 ??C (98.3 ??F) (Temporal)  - Ht 162.6 cm (5' 4.02)  - Wt 69.4 kg (153 lb)  - LMP 07/20/2017 (Exact Date)  - BMI 26.25 kg/m??     Physical Exam  General: well-appearing, sitting upright in no acute distress  Head: Normocephalic, atraumatic  ENT: No dental trauma noted.   Eyes: conjunctiva normal, non-erythematous, non-icteric, no discharge.  Neck: no thyroid enlargement or masses  Lungs: No increased work of breathing or audible wheezing  Skin: Warm, dry, no erythema or rash on exposed skin  Musculoskeletal: No visible gait abnormalities  Neurologic: Alert & oriented x 3, no gross sensorimotor abnormalities  Psychiatric: Pleasant, cooperative, good eye contact, appropriate thought processes    Wt Readings from Last 3 Encounters:   10/19/23 69.4 kg (153 lb)   10/19/23 69.8 kg (153 lb 12.8 oz)   10/17/23 69.4 kg (153 lb)      PHQ-9 PHQ-9 Total Score   10/22/2020   9:00 AM 6   09/04/2020   9:00 AM 3   08/21/2020   9:00 AM 5   05/22/2020   3:00 PM 9     LABS/IMAGING: I have reviewed pertinent recent labs and imaging in Epic.    I personally spent a total of 20 minutes taking care of this patient including time with the patient, pre-visit planning, documentation, reviewing labs, coordinating care, and reviewing documentation from other providers in [facility] and in other specialties.  This was all done on the day of the visit.  My (and this clinic's) longitudinal relationship with this patient was instrumental in caring for this patient today including caring for their chronic medical and psychiatric conditions.      Vincent Mohs, Vincent Waters, Vincent Waters (he/him)  [facility] at [facility]  [address]   [place], [place] [address]  Phone: [phone]  Fax: [phone]

## 2023-10-19 NOTE — Unmapped (Signed)
 ATTESTATION:  I discussed the findings, assessment and plan with Drs. Hampe & Carolin Chyle and agree with the findings and plan as documented in the note.  I was present for the entirety of the procedure(s).  Lindsey Rhodes. Nykayla Marcelli, MD

## 2023-10-20 ENCOUNTER — Inpatient Hospital Stay: Admit: 2023-10-20 | Discharge: 2023-10-21 | Payer: BLUE CROSS/BLUE SHIELD

## 2023-10-20 ENCOUNTER — Ambulatory Visit: Admit: 2023-10-20 | Discharge: 2023-10-21 | Payer: BLUE CROSS/BLUE SHIELD

## 2023-10-20 DIAGNOSIS — A549 Gonococcal infection, unspecified: Principal | ICD-10-CM

## 2023-10-20 MED ADMIN — cefTRIAXone (ROCEPHIN) intramuscular injection 500 mg: 500 mg | INTRAMUSCULAR | @ 17:00:00 | Stop: 2023-10-20

## 2023-10-23 NOTE — Unmapped (Unsigned)
 [facility] Neurology Clinic Summary     MMNT [address]  [facility] NEUROLOGY CLINIC [place] [address] [place]  [address]  [place] [address]  [phone]    Date: 10/24/2023  Patient Name: Vincent Waters  MRN: [id number]  PCP: Charleen Conn*            [name] is a 43 y.o. {NEU Hand Dominance:[phone]} adult seen in consultation at the [facility] Neu[facility] at the request of Dr. Lender Quarto for evaluation of ***.      Assessment & Plan:   {No diagnosis found. (Refresh or delete this SmartLink)}    Assessment & Plan            Thank you very much for this consultation. Please let us  know if any questions or concerns.     Devonda Folds, FNP    Note forwarded to Dr. Almodovar for review.    I personally spent *** minutes face-to-face and non-face-to-face in the care of this patient, which includes all pre, intra, and post visit time on the date of service.  All documented time was specific to the E/M visit and does not include any procedures that may have been performed.      CC: Vincent Waters*    Subjective:     Chief Concern: Vincent Waters is a 43 y.o. adult who comes for consultation and evaluation from Reeves Canter, MD regarding headache.    The history is obtained from the medical record and patient who is accompanied by ***    History of Present Illness:     History of Present Illness        MRI brain with and without contrast 08/26/2023  Impression   Few foci of subcortical white matter T2/FLAIR high signal, unchanged from prior exam and may reflect chronic microvascular ischemic change.      Borderline low-lying cerebellar tonsils.   MRA Head W Wo Contrast 08/26/2023  FINDINGS:   The visualized portions of the intracranial internal carotid, vertebral and basilar arteries are unremarkable.   There is no high grade stenosis.   No aneurysm visualized.     Note from Dr. Almodovar 07/25/2023  Headaches   - has had them for many years  - all over his head  - manageable      Headaches have changed to a thunderclap type of sensation in which he feels like he is sucker punched or stabbed on the right side of the headache. These symptoms go away within a few seconds. It can happen 6-7 times a day. This has been going on for about the lat 6 months.      Currently gabapentin [has stopped propranolol], ibuprofen does not help and does not sit well with stomach due to gastroparesis. Uses gabapentin for nerve pain in the setting of thoracic outlet syndrome [600 mg three times a day, he only takes the nighttime dose, he tolerates it very well].      He has noted that there is some vertigo happening intermittently. His mother does suffer from this.      No history of aneuryms or similar issues in family.       Outside records and prior workup has been reviewed (see Media section).     Past Medical History: He  has a past medical history of Anxiety, Arthritis, Asthma, Autoimmune autonomic neuropathy, Breast cyst, Depression, Financial  difficulties, Food intolerance (March 2018), Headache, tension-type, Lack of access to transportation, Migraines, Neuromuscular disorder, Rectal bleeding, Restless leg syndrome, Scleroderma, Seizures (07/27/2014), Sleep apnea treated with continuous positive airway pressure (CPAP), and Visual impairment.    Past Surgical History:   Past Surgical History:   Procedure Laterality Date    BREAST LUMPECTOMY      CHOLECYSTECTOMY  08/29/2012    COSMETIC SURGERY      HYSTERECTOMY Bilateral     08/2017    MASTECTOMY      04/2018    OOPHORECTOMY  2005    laparotomy    PR CYSTOURETHROSCOPY N/A 08/30/2017    Procedure: CYSTOURETHROSCOPY (SEPARATE PROCEDURE);  Surgeon: Fransico Ivy, MD;  [facility];  Service: Advanced Laparoscopy    PR EXCISION TURBINATE,SUBMUCOUS Bilateral 01/04/2021    Procedure: SUBMUCOUS RESECTION INFERIOR TURBINATE, PARTIAL OR COMPLETE, ANY METHOD;  Surgeon: Adam Swaziland Kimple, MD;  Location: [facility] OR [facility];  Service: ENT    PR INCISE TENDON/MUSCLE,SHLDR,SINGLE Right 11/16/2021    Procedure: RIGHT PEC MINOR RELEASE;  Surgeon: Abbey Hobby, MD;  Location: MAIN OR [place];  Service: Vascular    PR LAPAROSCOPY W TOT HYSTERECTUTERUS <=250 GRAM  W TUBE/OVARY N/A 08/30/2017    Procedure: LAPAROSCOPY, SURGICAL, WITH TOTAL HYSTERECTOMY, FOR UTERUS 250 G OR LESS; W/REMOVAL TUBE(S) AND/OR OVARY(S);  Surgeon: Fransico Ivy, MD;  Location: [facility] OR [facility];  Service: Advanced Laparoscopy    PR MASTECTOMY, SIMPLE, COMPLETE Bilateral 01/09/2020    Procedure: MASTECTOMY, SIMPLE, COMPLETE;  Surgeon: Adelene Homer, MD;  Location: MAIN OR [place];  Service: Plastics    PR NEUROPLASTY BRACHIAL PLEXUS,OPEN Right 11/16/2021    Procedure: RIGHT TOS DECOMPRESSION;  Surgeon: Abbey Hobby, MD;  Location: MAIN OR [facility];  Service: Vascular    PR REPAIR OF NASAL SEPTUM N/A 01/04/2021    Procedure: SEPTOPLASTY OR SUBMUCOUS RESECTION, WITH OR WITHOUT CARTILAGE SCORING, CONTOURING OR REPLACEMENT WITH GRAFT;  Surgeon: Adam Swaziland Kimple, MD;  Location: [facility] OR [facility];  Service: ENT    PR UPPER GI ENDOSCOPY,BIOPSY N/A 08/13/2019    Procedure: UGI ENDOSCOPY; WITH BIOPSY, SINGLE OR MULTIPLE;  Surgeon: Roylene Corn, MD;  [facility]: [facility] PROCEDURES [facility];  Service: Gastroenterology    PR UPPER GI ENDOSCOPY,BIOPSY N/A 03/10/2023    Procedure: UGI ENDOSCOPY; WITH BIOPSY, SINGLE OR MULTIPLE;  Surgeon: Darnella Elder, MD;  Location: GI PROCEDURES [facility];  Service: Gastroenterology    SINUS SURGERY  12/20    Turbinate reduction       Family History: His family history includes Breast cancer in his maternal grandmother and sister; Cancer in his mother and sister; Cancer (age of onset: 104) in his maternal uncle; Cataracts in his maternal grandmother; Hypertension in his father; Hypothyroidism in his mother; Immunodeficiency in his mother; Migraines in his mother; Ovarian cancer in his maternal grandmother; Seizures in his mother; Thyroid disease in his mother.    Social history: He  reports that he has never smoked. He has never been exposed to tobacco smoke. He has never used smokeless tobacco. He reports that he does not currently use alcohol. He reports that he does not currently use drugs.     Medication Reconciliation: In conjunction with entering the patient's initial or transfer medications, I performed medication reconciliation based on information available to me at the time.    Past Medical Hx, Family Hx, Social Hx, and Problem List has been reviewed in the [employer].    REVIEW OF SYSTEMS: 11+  review of systems was reviewed. Pertinent positives are detailed in the HPI.     Objective:     Vital Signs: His vitals were not taken for this visit.   Pain score-There were no vitals filed for this visit./10      Physical Exam        Neurological Exam               Diagnostic Studies and Review of Records   Labs:  Lab Results   Component Value Date    Hemoglobin A1C 4.9 08/03/2017    TSH 0.632 12/26/2022    TSH 1.233 04/21/2021    TSH 0.614 08/25/2020    TSH 1.08 12/27/2010    Free T4 1.15 06/05/2020    Free T4 1.20 01/22/2020    Folate 5.4 06/24/2021    Folate 8.6 06/05/2020    Vitamin B-12 297 06/24/2021    Vitamin B-12 594 06/24/2020    Iron 69 06/24/2021    Iron 78 07/04/2019    Ferritin 38.9 06/24/2021    Ferritin 14.4 (L) 07/04/2019    Antinuclear Antibodies (ANA) Positive (A) 11/17/2022    ENA Screen 0.30 02/23/2021    Zinc 0.80 06/05/2020    HIV Antigen/Antibody Combo Nonreactive 09/12/2023    RPR Nonreactive 09/12/2023    Hepatitis C Ab Nonreactive 05/25/2020    Hep B Surface Ag Nonreactive 05/25/2020       Lab Results   Component Value Date/Time    RF 4.4 08/25/2020 09:00 AM    ESR 17 (H) 01/19/2023 03:35 PM    ESR 7 12/26/2022 08:45 AM    ESR 2 08/25/2020 09:00 AM    CRP 7.0 01/19/2023 03:35 PM CRP 5.0 12/26/2022 08:45 AM    CRP <4.0 08/25/2020 09:00 AM    DSDNAAB Negative 02/23/2021 02:36 PM       Imaging and procedure:

## 2023-10-26 ENCOUNTER — Ambulatory Visit
Admit: 2023-10-26 | Discharge: 2023-10-26 | Payer: BLUE CROSS/BLUE SHIELD | Attending: Student in an Organized Health Care Education/Training Program | Primary: Student in an Organized Health Care Education/Training Program

## 2023-10-26 ENCOUNTER — Ambulatory Visit: Admit: 2023-10-26 | Discharge: 2023-10-26 | Payer: BLUE CROSS/BLUE SHIELD

## 2023-10-26 DIAGNOSIS — R002 Palpitations: Principal | ICD-10-CM

## 2023-10-26 DIAGNOSIS — R42 Dizziness and giddiness: Principal | ICD-10-CM

## 2023-10-26 DIAGNOSIS — I340 Nonrheumatic mitral (valve) insufficiency: Principal | ICD-10-CM

## 2023-10-26 NOTE — Unmapped (Signed)
 DIVISION OF CARDIOLOGY  [facility]        Date of Service: 10/26/2023      PCP: Referring Provider:   Reeves Canter, MD  [address]  [place] [address]  Phone: [phone]  Fax: [phone] Reeves Canter, MD  [address]  [place],  [place] [address]  Phone: [phone]  Fax: [phone]     Assessment and Plan:     Vincent Waters is a 43 y.o. adult with cardiac medical history detailed below. The patient is seen in cardiology clinic for dizziness and palpitations.    Dizziness - Palpitations: Symptoms do not always occur together but he is describing palpitations which could be consistent with PACs or PVCs.  It seems less likely this would be the cause of such significant dizziness, but today we placed a 2-week monitor to see if symptoms correlate with an arrhythmia.  EKG today reassuring. Had a 24 Holter years ago which was normal, but didn't have symptoms during the wear period.    Regarding other potential cardiac etiologies, dizziness does not seem related to fluctuating blood pressures.  Not consistent with POTS.  No structural issues on his recent echo aside from mild MR.  Would be atypical for a carotid or other vascular issue based on his history and MRI/MRA.    The 10-year ASCVD risk score (Arnett DK, et al., 2019) is: 4.9%    Return in about 5 weeks (around 11/30/2023).      Sheran Diesel, MD  [facility] Internal Medicine - Cardiology  Office [phone]  Office [phone]    [facility] Cardiology at [facility] (P): [phone]  Office (F): [phone]      Subjective:     HPI: Vincent Waters is a 43 y.o. adult with pertinent medical history below. The patient is seen at the request of Reeves Canter for evaluation of dizziness and palpitations.    Patient has been experiencing significant dizziness for a while now, seeing multiple specialists in addition to his thorough PCP including neurology, ENT, allergist, GI, and even dentistry.  Several providers have thought about changing his medications in case those are playing a role.  Has had an MRI 08/2023 with no acute findings. Doesn't seem like vertigo per ENT and PT.    He describes it as a random dizziness that can often occur with walking and need to falls.  Not really associated with changes in position.  Not every time, but often he will have an associated fluttering in his chest which he describes as a pause and an occasional sharp pain, often followed by coughing. He used to have more of them when drinking coffee but doesn't drink much caffeine nowadays.  Does not drink alcohol.  At one point thought migraines could worsen the fluttering but it has not been consistent.  He denies any syncopal episodes.    Recent echo was reassuring with just mild MR.    Discussed his mom with ascending aortic aneurysm and his reassuring workup between his echo and MRA chest 08/2021.    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 Normal aorta  Holter 2021: no arrhythmia - no PVCs, but only 24 hrs      Family history: No early MIs or HF  Tobacco use: None  Alcohol use: None  Living Situation: lives independently       Objective:       Physical Exam  BP 126/83 (BP Site: R Arm, BP Position: Sitting, BP Cuff Size: Medium)  - Pulse 69  - Temp 36.7 ??C (98 ??F) (Oral)  - LMP 07/20/2017 (Exact Date)  - SpO2 98%    Wt Readings from Last 3 Encounters:   10/19/23 69.4 kg (153 lb)   10/19/23 69.8 kg (153 lb 12.8 oz)   10/17/23 69.4 kg (153 lb)     PTHomeBP          General: Alert, NAD, sitting up comfortably in chair next to exam table  Cardiac: normal rate, regular rhythm, no murmurs rubs or gallops. No JVD at 90 degrees with hepatojugular reflux. No carotid bruits. 2+ Right radial pulse.  Pulmonary: CTAB, no increased work of breathing  Extremities: No LE edema. Legs are warm    OTHER PERTINENT LABS: (personally reviewed 10/26/2023)  Lab Results   Component Value Date    CHOL 157 12/26/2022    CHOL 187 01/22/2020    LDL 94 12/26/2022    LDL 123 (H) 01/22/2020    HDL 52 12/26/2022    HDL 47 01/22/2020    TRIG 54 12/26/2022    TRIG 84 01/22/2020       Lab Results   Component Value Date    Hemoglobin A1C 4.9 08/03/2017     Lab Results   Component Value Date    Creatinine 0.91 01/19/2023     Lab Results   Component Value Date    Potassium 5.0 12/26/2022     Lab Results   Component Value Date    HGB 14.9 01/19/2023    Platelet 316 01/19/2023     No results found for: BNP, PROBNP  No results found for: TROPONINI

## 2023-10-26 NOTE — Unmapped (Signed)
 We placed a heart rhythm monitor (aka ziopatch) to evaluate for any heart rhythm abnormalities.

## 2023-10-27 MED FILL — CLONAZEPAM 0.5 MG TABLET: ORAL | 30 days supply | Qty: 30 | Fill #2

## 2023-10-30 ENCOUNTER — Ambulatory Visit
Admit: 2023-10-30 | Payer: BLUE CROSS/BLUE SHIELD | Attending: Rehabilitative and Restorative Service Providers" | Primary: Rehabilitative and Restorative Service Providers"

## 2023-10-30 ENCOUNTER — Ambulatory Visit
Admit: 2023-10-30 | Discharge: 2023-11-26 | Payer: BLUE CROSS/BLUE SHIELD | Attending: Rehabilitative and Restorative Service Providers" | Primary: Rehabilitative and Restorative Service Providers"

## 2023-10-30 NOTE — Unmapped (Signed)
 [facility] THERAPY SERVICES AT [place]  OUTPATIENT PHYSICAL THERAPY  10/30/2023  Note Type: Treatment Note       Patient Name: Vincent Waters  Date of Birth:[birth year]  Diagnosis:   Encounter Diagnoses   Name Primary?    Chronic right-sided low back pain without sciatica Yes    Low back pain radiating to right lower extremity     Tear of right acetabular labrum, subsequent encounter      Referring MD:  Reeves Canter,*     Date of Onset of Impairment-08/23/2016  Date PT Care Plan Established or Reviewed-08/24/2023  Date PT Treatment Started-08/24/2023   Visit Count: 9  Plan of Care Effective Date:          Assessment/Plan:    Assessment  Assessment details:    Returned to manual therapy to calm lumbar musculature.  Immediately afterwards, Vincent Waters stated he felt better.   Progressing right hip/core strength within tolerance.  He is tolerating it well, but he needs some verbal cuing for correct exercise performance.  Encouraged him to continue to work on improving his front plank time at home and he agreed to do this.  All questions answered.  This patient requires skilled physical therapy services to address the outlined impairments in order to return to his desired level of function.           Impairments: pain, muscular restrictions, hypertonicity, core weakness, impaired ADLs, joint restriction, decreased strength and gait deviation                      Therapy Goals      Goals:      Goals:  In 12 weeks:  Goal #1: Patient will be independent with progressively monitored home exercise program.   Baseline/Current: Patient is unfamiliar with home exercise program.     Goal #2: Patient will demonstrate lumbar ROM within normal limits without pain in order to perform daily functional activities such as bending to pick an object off the ground, twisting to reach into the patient's backseat or bend backwards in order to lift an object overhead.   Baseline/Current: Patient has painful lumbar ROM (see ROM testing).     Goal #3: Patient will demonstrate right hip strength within normal limits without pain in order to allow for functional tasks such as symmetrical walking, squatting and negotiating stairs.   Baseline/Current: Patient has limited right hip strength (see manual muscle testing).      Goal #4: Patient will be able to lift at least 75 lb from floor to waist with good lifting mechanics in order to demonstrate good core and trunk strength.   Baseline/Current: Patient has pain and difficulty lifting at present.    Goal #5: Patient will report improvement in symptoms while sitting and ability to sit up to 1 hour with minimal symptoms.  Baseline/Current: Patient has difficulty sitting at present.    Goal #6: Patient's FOTO score will improve from 34 at initial evaluation to >/= 53 at discharge in order to demonstrate a significant improvement in self-reported outcome measures that directly result from his course of physical therapy.   Baseline/Current: FOTO = 34    Plan    Therapy options: will be seen for skilled physical therapy services    Planned therapy interventions: 20560, 20561-Dry Needling 1-2, 3+ areas, 97010-Cold Packs/Hot Packs, 97012-Mechanical Traction, 97032, G0283-Electrical Stimulation (unattended, attended), 97110-Therapeutic Exercises, 97112-Neuromuscular Re-education, 97116-Gait Training, 97140-Manual Therapy, 97164-Re-evaluation (PT) and 97530-Therapeutic Activities  Frequency: 1x week    Duration in weeks: 12    Education provided to: patient.    Education provided: Surveyor, minerals, HEP and Treatment options and plan    Education results: verbalized good understanding, needs reinforcement, demonstrates understanding and needs further instruction.    Communication/Consultation: N/A.    Next visit plan:        Manual therapy/TDN to calm lumbar musculature, right hip/core/trunk strengthening.    Total Session Time: 40    Treatment rendered today:      Therapeutic Exercise: 25 minutes  - Patient Education: Plan of Care, Home Exercise Program  - Side Lying Clam With Band: 2 x [15 x 3] (L6)  - Side Lying Hip Abduction: 2 x 15, 4 lb  - Fire Hydrants: 2 x 12, bilateral (L4)  - Supine TA/Pelvic Tilt: 20 x   - Supine TA/Pelvic Tilt/March: 20 x   - Supine TA/Pelvic Tilt/90-90 Heel Taps: 2 x 10, bilateral, alternating  - Supine TA/Dead Bug: 2 x 8, bilateral alternating  - Bird Dog: 2 x 10, bilateral, alternating    Manual Therapy: 15 minutes  - Soft Tissue Mobilization right lumbar musculature  - Left Side Lying Lumbar Mobilization    Plan details: Access Code: [email or web address]  URL: [email or web address]  Date: 10/18/2023  Prepared by: Irineo Manns    Exercises  - Supine Posterior Pelvic Tilt  - 1 x daily - 7 x weekly - 1 sets - 20 reps - 5 hold  - Sidelying Thoracolumbar Mobilisation with Towel Roll  - 2 x daily - 7 x weekly - 1 sets - 1 reps - 5 minutes hold  - Clamshell with Resistance (Mirrored)  - 1 x daily - 7 x weekly - 2 sets - 10 reps - 3 hold  - Sidelying Hip Abduction (Mirrored)  - 1 x daily - 7 x weekly - 2 sets - 10 reps  - Hooklying Transversus Abdominis Palpation  - 1 x daily - 7 x weekly - 1 sets - 15 reps - 5 hold  - Supine March  - 1 x daily - 7 x weekly - 2 sets - 10 reps  - Supine 90/90 Alternating Toe Touch  - 1 x daily - 7 x weekly - 2 sets - 10 reps  - Full Plank  - 1 x daily - 7 x weekly - 1 sets - 3 reps - 30-45 hold  - Supine Bridge with Leg Extension  - 1 x daily - 7 x weekly - 2 sets - 10 reps - 5 hold  - Bird Dog  - 1 x daily - 7 x weekly - 2 sets - 10 reps  - Supine Dead Bug with Leg Extension  - 1 x daily - 7 x weekly - 2 sets - 10 reps  - Quadruped Hip Abduction with Resistance Loop  - 1 x daily - 7 x weekly - 2 sets - 10 reps      Subjective:     History of Present Condition     Date of onset:  08/23/2016    Date is approximation?: yes    Explanation:  Patient reports onset of symptoms in 2018.    History of Present Condition/Chief Complaint:  Vincent Waters is a 43 y.o. right hand dominant adult (male to male transition in process) who presents to outpatient physical therapy with chronic low back pressure, tightness and pain that began in 2018.  Patient cannot remember any specific traumatic mechanism of injury that  may have been the result of his symptoms, but he does report that he broke his right leg 2x when he was younger.  The first one was when he was a baby.  The second time he broke it falling off the couch when he was 8.  He can't remember if it was his femur or tibia and he hasn't had a recent x-ray.  Subjective:  Vincent Waters reports that he feels like the shoulder is a bit locked and his back is bothering him.  He has noticed that during weeks he doesn't have PT, he doesn't feel as good, even if he does the exercises during the week.  He is doing his exercises consistently.  Pain:     Current pain rating:  6  Location:  Right low back and radiating down the anterior aspect of his right lower extremity    Quality:  Aching    Progression:  Improved      Objective:       Palpation     Right Lumbar   Hypertonic in the erector spinae, lumbar paraspinals and quadratus lumborum.    Joint Mobility  Lumbar  Lumbar Joint Mobility   Hypomobile at L4 and L5.   Pain at L4 and L5.                                   I attest that I have reviewed the above information.  Signed: Willistine Hartigan, PT  10/30/2023 12:23 PM

## 2023-10-30 NOTE — Unmapped (Unsigned)
 [facility] Neurology Clinic Summary     MMNT [address]  [facility] NEUROLOGY CLINIC [place] [address] [place]  [address]  [place] [address]  [phone]    Date: 10/31/2023  Patient Name: Tobiah Celestine [place]  MRN: [id number]  PCP: Charleen Conn*            Mr. Springfield is a 43 y.o. {NEU Hand Dominance:[phone]} adult seen in consultation at the [facility] at the request of Dr. Lender Quarto for evaluation of ***.      Assessment & Plan:   {No diagnosis found. (Refresh or delete this SmartLink)}    Assessment & Plan            Thank you very much for this consultation. Please let us  know if any questions or concerns.     Devonda Folds, FNP    Note forwarded to Dr. Almodovar for review.    I personally spent *** minutes face-to-face and non-face-to-face in the care of this patient, which includes all pre, intra, and post visit time on the date of service.  All documented time was specific to the E/M visit and does not include any procedures that may have been performed.      CC: Jacquenette Mattes*    Subjective:     Chief Concern: Edge Mauger is a 43 y.o. adult who comes for consultation and evaluation from Reeves Canter, MD regarding headache.    The history is obtained from the medical record and patient who is accompanied by ***    History of Present Illness:     History of Present Illness        MRI brain with and without contrast 08/26/2023  Impression   Few foci of subcortical white matter T2/FLAIR high signal, unchanged from prior exam and may reflect chronic microvascular ischemic change.      Borderline low-lying cerebellar tonsils.   MRA Head W Wo Contrast 08/26/2023  FINDINGS:   The visualized portions of the intracranial internal carotid, vertebral and basilar arteries are unremarkable.   There is no high grade stenosis.   No aneurysm visualized.     Note from Dr. Almodovar 07/25/2023  Headaches   - has had them for many years  - all over his head  - manageable      Headaches have changed to a thunderclap type of sensation in which he feels like he is sucker punched or stabbed on the right side of the headache. These symptoms go away within a few seconds. It can happen 6-7 times a day. This has been going on for about the lat 6 months.      Currently gabapentin [has stopped propranolol], ibuprofen does not help and does not sit well with stomach due to gastroparesis. Uses gabapentin for nerve pain in the setting of thoracic outlet syndrome [600 mg three times a day, he only takes the nighttime dose, he tolerates it very well].      He has noted that there is some vertigo happening intermittently. His mother does suffer from this.      No history of aneuryms or similar issues in family.       Outside records and prior workup has been reviewed (see Media section).     Past Medical History: He  has a past medical history of Anxiety, Arthritis, Asthma (HHS-HCC), Autoimmune autonomic neuropathy, Breast cyst, Depression,  Financial difficulties, Food intolerance (March 2018), Headache, tension-type, Lack of access to transportation, Migraines, Neuromuscular disorder, Rectal bleeding, Restless leg syndrome, Scleroderma, Seizures (07/27/2014), Sleep apnea treated with continuous positive airway pressure (CPAP), and Visual impairment.    Past Surgical History:   Past Surgical History:   Procedure Laterality Date    BREAST LUMPECTOMY      CHOLECYSTECTOMY  08/29/2012    COSMETIC SURGERY      HYSTERECTOMY Bilateral     08/2017    MASTECTOMY      04/2018    OOPHORECTOMY  2005    laparotomy    PR CYSTOURETHROSCOPY N/A 08/30/2017    Procedure: CYSTOURETHROSCOPY (SEPARATE PROCEDURE);  Surgeon: Fransico Ivy, MD;  [facility];  Service: Advanced Laparoscopy    PR EXCISION TURBINATE,SUBMUCOUS Bilateral 01/04/2021    Procedure: SUBMUCOUS RESECTION INFERIOR TURBINATE, PARTIAL OR COMPLETE, ANY METHOD;  Surgeon: Adam Swaziland Kimple, MD;  Location: [facility] OR [facility];  Service: ENT    PR INCISE TENDON/MUSCLE,SHLDR,SINGLE Right 11/16/2021    Procedure: RIGHT PEC MINOR RELEASE;  Surgeon: Abbey Hobby, MD;  Location: MAIN OR [place];  Service: Vascular    PR LAPAROSCOPY W TOT HYSTERECTUTERUS <=250 GRAM  W TUBE/OVARY N/A 08/30/2017    Procedure: LAPAROSCOPY, SURGICAL, WITH TOTAL HYSTERECTOMY, FOR UTERUS 250 G OR LESS; W/REMOVAL TUBE(S) AND/OR OVARY(S);  Surgeon: Fransico Ivy, MD;  Location: [facility] OR [facility];  Service: Advanced Laparoscopy    PR MASTECTOMY, SIMPLE, COMPLETE Bilateral 01/09/2020    Procedure: MASTECTOMY, SIMPLE, COMPLETE;  Surgeon: Adelene Homer, MD;  Location: MAIN OR [place];  Service: Plastics    PR NEUROPLASTY BRACHIAL PLEXUS,OPEN Right 11/16/2021    Procedure: RIGHT TOS DECOMPRESSION;  Surgeon: Abbey Hobby, MD;  Location: MAIN OR [facility];  Service: Vascular    PR REPAIR OF NASAL SEPTUM N/A 01/04/2021    Procedure: SEPTOPLASTY OR SUBMUCOUS RESECTION, WITH OR WITHOUT CARTILAGE SCORING, CONTOURING OR REPLACEMENT WITH GRAFT;  Surgeon: Adam Swaziland Kimple, MD;  Location: [facility] OR [facility];  Service: ENT    PR UPPER GI ENDOSCOPY,BIOPSY N/A 08/13/2019    Procedure: UGI ENDOSCOPY; WITH BIOPSY, SINGLE OR MULTIPLE;  Surgeon: Roylene Corn, MD;  Location: [facility] PROCEDURES [facility];  Service: Gastroenterology    PR UPPER GI ENDOSCOPY,BIOPSY N/A 03/10/2023    Procedure: UGI ENDOSCOPY; WITH BIOPSY, SINGLE OR MULTIPLE;  Surgeon: Darnella Elder, MD;  Location: GI PROCEDURES [facility];  Service: Gastroenterology    SINUS SURGERY  12/20    Turbinate reduction       Family History: His family history includes Breast cancer in his maternal grandmother and sister; Cancer in his mother and sister; Cancer (age of onset: 69) in his maternal uncle; Cataracts in his maternal grandmother; Hypertension in his father; Hypothyroidism in his mother; Immunodeficiency in his mother; Migraines in his mother; Ovarian cancer in his maternal grandmother; Seizures in his mother; Thyroid disease in his mother.    Social history: He  reports that he has never smoked. He has never been exposed to tobacco smoke. He has never used smokeless tobacco. He reports that he does not currently use alcohol. He reports that he does not currently use drugs.     Medication Reconciliation: In conjunction with entering the patient's initial or transfer medications, I performed medication reconciliation based on information available to me at the time.    Past Medical Hx, Family Hx, Social Hx, and Problem List has been reviewed in the [employer].    REVIEW OF SYSTEMS:  11+ review of systems was reviewed. Pertinent positives are detailed in the HPI.     Objective:     Vital Signs: His vitals were not taken for this visit.   Pain score-There were no vitals filed for this visit./10      Physical Exam        Neurological Exam               Diagnostic Studies and Review of Records   Labs:  Lab Results   Component Value Date    Hemoglobin A1C 4.9 08/03/2017    TSH 0.632 12/26/2022    TSH 1.233 04/21/2021    TSH 0.614 08/25/2020    TSH 1.08 12/27/2010    Free T4 1.15 06/05/2020    Free T4 1.20 01/22/2020    Folate 5.4 06/24/2021    Folate 8.6 06/05/2020    Vitamin B-12 297 06/24/2021    Vitamin B-12 594 06/24/2020    Iron 69 06/24/2021    Iron 78 07/04/2019    Ferritin 38.9 06/24/2021    Ferritin 14.4 (L) 07/04/2019    Antinuclear Antibodies (ANA) Positive (A) 11/17/2022    ENA Screen 0.30 02/23/2021    Zinc 0.80 06/05/2020    HIV Antigen/Antibody Combo Nonreactive 09/12/2023    RPR Nonreactive 09/12/2023    Hepatitis C Ab Nonreactive 05/25/2020    Hep B Surface Ag Nonreactive 05/25/2020       Lab Results   Component Value Date/Time    RF 4.4 08/25/2020 09:00 AM    ESR 17 (H) 01/19/2023 03:35 PM    ESR 7 12/26/2022 08:45 AM    ESR 2 08/25/2020 09:00 AM    CRP 7.0 01/19/2023 03:35 PM    CRP 5.0 12/26/2022 08:45 AM    CRP <4.0 08/25/2020 09:00 AM    DSDNAAB Negative 02/23/2021 02:36 PM       Imaging and procedure:

## 2023-11-01 NOTE — Unmapped (Signed)
 [facility] Delivery Pharm[facility] Refill Coordination Note    Specialty Medication(s) to be Shipped:   CF/Pulmonary/Asthma: Descovy 200-25    Other medication(s) to be shipped: No additional medications requested for fill at this time     [facility], DOB: 1981/01/09  Phone: [phone] (home) [phone] (work)      All above HIPAA information was verified with patient.     Was a Nurse, learning disability used for this call? No    Completed refill call assessment today to schedule patient's medication shipment from the [facility]  [phone]).  All relevant notes have been reviewed.     Specialty medication(s) and dose(s) confirmed: Regimen is correct and unchanged.   Changes to medications: Shia reports no changes at this time.  Changes to insurance: No  New side effects reported not previously addressed with a pharmacist or physician: None reported  Questions for the pharmacist: No    Confirmed patient received a Conservation officer, historic buildings and a Surveyor, mining with first shipment. The patient will receive a drug information handout for each medication shipped and additional FDA Medication Guides as required.       DISEASE/MEDICATION-SPECIFIC INFORMATION        N/A    SPECIALTY MEDICATION ADHERENCE     Medication Adherence    Patient reported X missed doses in the last month: 3  Specialty Medication: DESCOVY 200-25 mg tablet (emtricitabine-tenofovir alafen)  Patient is on additional specialty medications: No  Patient is on more than two specialty medications: No  Any gaps in refill history greater than 2 weeks in the last 3 months: no  Demonstrates understanding of importance of adherence: yes              Were doses missed due to medication being on hold? No    DESCOVY 200-25  mg: 0 days of medicine on hand       REFERRAL TO PHARMACIST     Referral to the pharmacist: Not needed      [facility]     Shipping address confirmed in Epic.     Cost and Payment: Patient has a $0 copay, payment information is not required.    Delivery Scheduled: Yes, Expected medication delivery date: 11/01/23.     Medication will be delivered via Same Day Courier to the prescription address in Epic WAM.    Stephen Ehrlich   [facility]  Specialty Technician

## 2023-11-02 ENCOUNTER — Ambulatory Visit
Admit: 2023-11-02 | Discharge: 2023-11-03 | Payer: BLUE CROSS/BLUE SHIELD | Attending: Student in an Organized Health Care Education/Training Program | Primary: Student in an Organized Health Care Education/Training Program

## 2023-11-02 DIAGNOSIS — Z79899 Other long term (current) drug therapy: Principal | ICD-10-CM

## 2023-11-02 DIAGNOSIS — Z4802 Encounter for removal of sutures: Principal | ICD-10-CM

## 2023-11-02 LAB — CREATININE
CREATININE: 0.9 mg/dL (ref 0.73–1.02)
EGFR CKD-EPI (2021) MALE: >90 mL/min/{1.73_m2} (ref >=60)

## 2023-11-02 LAB — LIPID PANEL
CHOLESTEROL: 161 mg/dL (ref <200)
HDL CHOLESTEROL: 52 mg/dL
LDL CHOLESTEROL CALCULATED: 99 mg/dL (ref <100)
NON-HDL CHOLESTEROL: 109 mg/dL (ref <130)
TRIGLYCERIDES: 62 mg/dL (ref <150)

## 2023-11-02 MED FILL — METOCLOPRAMIDE 10 MG TABLET: ORAL | 90 days supply | Qty: 135 | Fill #1

## 2023-11-02 MED FILL — DESCOVY 200 MG-25 MG TABLET: ORAL | 30 days supply | Qty: 30 | Fill #1

## 2023-11-02 MED FILL — PRUCALOPRIDE 2 MG TABLET: ORAL | 90 days supply | Qty: 90 | Fill #1

## 2023-11-02 NOTE — Unmapped (Signed)
 Hi Rosmary,    It was great to see you today. I look forward to seeing you at your next visit. In the meantime, please feel free to message me on mychart with any non-time-sensitive questions or requests. For any time-sensitive matters (including completion of medical clearance, school/work physical or other forms), please call [phone] to request a phone, video, or in-person visit with me or one of my colleagues.     Additionally, [facility] Medicine has an Urgent Care at our [facility] location! Call [phone] for same-day appointments, available Monday-Friday 7am-8pm; Saturday and Sunday 12pm-5pm. If you think you are having an emergency, please call 911 or go to your nearest emergency department.     If you ever need urgent help with your mental health, including for any thoughts of self-harm or suicide, please call the [place] mental health crisis line at [phone], or the national suicide prevention hotline at 713-525-3688.     Take care,    Valdene Garret, MD, MPH (he/him)  [facility] at [facility]  [address]   [place], [place] [address]  Phone: [phone]  Fax: [phone]

## 2023-11-02 NOTE — Unmapped (Signed)
 [facility]  Established Patient Clinic Note    Assessment/Plan:   Vincent Waters is a 43 y.o.adult who presents for follow-up.    Problem List Items Addressed This Visit    None    - Current meds:   Current Outpatient Medications:     azelastine (ASTELIN) 137 mcg (0.1 %) nasal spray, Instill 2 sprays into each nostril two (2) times a day as needed for rhinitis., Disp: 30 mL, Rfl: 1    cetirizine (ZYRTEC) 10 MG tablet, Take 1 tablet (10 mg total) by mouth daily., Disp: 30 tablet, Rfl: 12    clonazePAM (KLONOPIN) 0.5 MG tablet, Take 0.5 tablets (0.25 mg total) by mouth two (2) times a day as needed for anxiety., Disp: 30 tablet, Rfl: 5    dextroamphetamine-amphetamine (ADDERALL) 20 mg tablet, Take 1 tablet (20 mg total) by mouth daily., Disp: 30 tablet, Rfl: 0    dextroamphetamine-amphetamine (ADDERALL) 20 mg tablet, Take 1 tablet (20 mg total) by mouth daily., Disp: 30 tablet, Rfl: 0    diclofenac sodium (VOLTAREN) 1 % gel, Apply 2 g topically four (4) times a day., Disp: 100 g, Rfl: 0    emtricitabine-tenofovir alafen (DESCOVY) 200-25 mg tablet, Take 1 tablet by mouth daily., Disp: 90 tablet, Rfl: 0    gabapentin (NEURONTIN) 250 mg/5 mL oral solution, Take 12 mL (600 mg total) by mouth Three (3) times a day., Disp: 1080 mL, Rfl: 2    inhalational spacing device Spcr, Use as directed with albuterol and symbicort, Disp: 1 each, Rfl: 1    ipratropium (ATROVENT) 42 mcg (0.06 %) nasal spray, 1-2 sprays in each nostril as needed for drainage, up to 4 time daily., Disp: 15 mL, Rfl: 2    metoclopramide (REGLAN) 10 MG tablet, Take 1/2 tablet (5 mg total) by mouth Three (3) times a day with a meal., Disp: 135 tablet, Rfl: 3    nebulizer and compressor (COMP-AIR NEBULIZER COMPRESSOR) Devi, 1 each by Miscellaneous route nightly as needed., Disp: 1 each, Rfl: 0    needle, disp, 25 Fordyce (BD REGULAR BEVEL NEEDLES) 25 Zacchary x 5/8 Ndle, For subcutaneous hormone injection., Disp: 25 each, Rfl: 0    nifedipine 0.3% lidocaine 1.5% in petrolatum ointment, Apply a pea-size amount to anal area 3 times a day, Disp: 100 g, Rfl: 0    omalizumab (XOLAIR) 300 mg/2 mL auto-injector, Inject the contents of 1 pen (300 mg) under the skin every fourteen (14) days., Disp: 4 mL, Rfl: 11    omalizumab 75 mg/0.5 mL AtIn, Inject the contents of 1 pen (75 mg) under the skin every fourteen (14) days., Disp: 1 mL, Rfl: 11    pantoprazole (PROTONIX) 40 MG tablet, Take 1 tablet (40 mg total) by mouth daily., Disp: 90 tablet, Rfl: 3    prucalopride (MOTEGRITY) 2 mg Tab, Take 1 tablet (2 mg total) by mouth daily., Disp: 90 tablet, Rfl: 3    safety needles (BD ECLIPSE) 25 Horald x 1 Ndle, use for testosterone, Disp: 12 each, Rfl: 0    safety needles (BD SAFETYGLIDE NEEDLE) 18 Anees x 1 1/2 Ndle, For drawing hormone injection, Disp: 25 each, Rfl: 0    syringe, disposable, (EASY TOUCH LUER LOCK SYRINGE) 1 mL Syrg, Use for weekly hormone injection., Disp: 4 each, Rfl: 0    syringe, disposable, (EASY TOUCH LUER LOCK SYRINGE) 1 mL Syrg, Use for testosterone, Disp: 12 each, Rfl: 0    syringe, disposable, 2.5 mL Syrg, Use weekly,  Disp: 30 Syringe, Rfl: 2    testosterone cypionate (DEPOTESTOTERONE CYPIONATE) 200 mg/mL injection, Inject 0.2 mL (40 mg total) into the muscle once a week., Disp: 12 mL, Rfl: 0    triamcinolone (KENALOG) 0.1 % cream, Apply topically Two (2) times a day., Disp: 15 g, Rfl: 0    albuterol 2.5 mg /3 mL (0.083 %) nebulizer solution, Inhale 3 mL (2.5 mg total) by nebulization every six (6) hours as needed for wheezing or shortness of breath., Disp: 90 mL, Rfl: 1    budesonide-formoterol (SYMBICORT) 160-4.5 mcg/actuation inhaler, Inhale 1 puff by mouth Two (2) times a day., Disp: 10.2 g, Rfl: 5    EPINEPHrine (EPIPEN) 0.3 mg/0.3 mL injection, Inject 0.3 mL (0.3 mg total) under the skin once for 1 dose., Disp: 2 each, Rfl: 0    Current Facility-Administered Medications:     omalizumab (XOLAIR) injection 300 mg, 300 mg, Subcutaneous, Q28 Days, Vincent Waters, Vincent Waters, Vincent Waters, 300 mg at 05/24/22 1138    Assessment & Plan  # Nasal congestion, likely 2/2 viral URI - asthma  Symptoms significantly improved since start on Monday 5/12, but congestion persists. No chest pain, shortness of breath, dizziness, lightheadedness, or any other concerning symptoms. Not using Flonase regularly. Lungs CTAB but requires symbicort refill as he is almost out. Has not had to use any extra rescue doses.   - Prescribe Flonase.  - Refill Symbicort per pt request    # Suture removal  Suture in place on R proximal forearm near elbow, well-healing without erythema, warmth, crusting, TTP, or drainage (see media tab for pre and post-removal image). After discussion of risks and benefits, pt elected to proceed with suture removal in clinic. Area was prepped appropriately and suture was cut and easily removed with gentle traction via tweezers. Pt tolerated procedure well without bleeding or other complications. Pt has no further questions or concerns.   - Suture removed  - Continue routine wound care    # Pre-exposure prophylaxis (PrEP) against HIV  Patient is sexually active trans male with male and male partners. Condom-less sexual encounters since last visit: yes. Known exposure to HIV: no. Known exposure to other STIs: recently treated for gonorrhea with resolution of symptoms. IV drug use: no. Condom use: inconsistent. During sex, the following body parts contact a penis: hands, mouth, genitals. Denies fever, fatigue, skin rash, pharyngitis, cervical lymphadenopathy, or any other symptoms concerning for acute HIV. Denies throat pain, urethral discharge, or any other symptoms concerning for acute GC/CT infection. HEENT exam without pharyngeal erythema, tonsillar edema or exudate, palpably enlarged LN, or rash on exposed skin. Based on this information, they are at high risk for contracting HIV and are a good candidate for pre-exposure prophylaxis against HIV. This is a preventive medicine service and E/M modifier 33 should apply to all testing and prescriptions. The lab tests listed below are required to ensure the safety of continuing pre-exposure prophylaxis (PrEP) against HIV.  - Labs drawn today: HIV, Cr, lipid panel; declines GC/CT and RPR given recent testing  - Repeat monitoring labs in 3 months  - Provided adherence and risk reduction / safer sex counseling  - PrEP refill provided today        HEALTH MAINTENANCE ITEMS STILL DUE:  There are no preventive care reminders to display for this patient.  Follow-up: No follow-ups on file.    Future Appointments   Date Time Provider Department Center   11/06/2023  9:30 AM Stem, Artice Last, MD SURGI1UMH [facility]  11/09/2023 10:30 AM Andrea Banda, PMHNP OPTCVilcom [facility]   11/10/2023  9:20 AM [place] SOD ADMISSIONS AND SCREENING PROVIDER UNCSODASCN [facility]   11/10/2023 11:00 AM Charletta Cons, PT PTOT [facility]   11/16/2023  9:00 AM Abbey Hobby, MD HVVSURMMNT [facility]   11/17/2023  8:45 AM Charletta Cons, PT PTOT [facility]   11/17/2023 11:00 AM Edie Goon, FNP [facility]   11/24/2023  8:45 AM Myer, Rumalda Counter, PT PTOT [facility]   11/28/2023 12:15 PM Myer, Rumalda Counter, PT PTOT [facility]   11/30/2023  8:40 AM Palmira Bock, MD CARDSTHPOINT [facility]   12/06/2023 12:15 PM Myer, Rumalda Counter, PT PTOT [facility]   12/11/2023 12:15 PM Myer, Rumalda Counter, PT PTOT [facility]   12/12/2023  1:30 PM [facility] FLUORO RM 8 IFLUOUW [place]   12/12/2023  2:30 PM [facility] MRI RM 6 [facility] [place]   01/01/2024  8:00 AM Caid, Eusebio High, MD UNCALLERGET [facility]   01/01/2024  9:20 AM MEDDDN MOTILITY NURSE UNCGIMEDET [facility]   01/04/2024  8:45 AM Kimple, Adam Swaziland, MD UNCOTOMEWVIL [facility]   01/05/2024  8:00 AM Lobonc, Jerlene Moody, MD ANESPAINMRKT [facility]       Subjective   Vincent Waters is a 43 y.o. adult  coming to clinic today for follow-up.    Chief Complaint Patient presents with    Follow-up     Needs a tune-up (pt's words)     History of Present Illness  Vincent Waters is a 43 year old male who presents for suture removal and follow up of chronic conditions.    He underwent EIC removal on his R forearm two weeks ago, and was supposed to RTC for suture removal one week after but did not do so. Requests suture removal today.     He is currently wearing a heart monitor until next Friday following a cardiology consultation. The cardiologist could not determine the exact issue but suspected premature ventricular contractions (PVCs). A stress test was deemed unnecessary, and monitoring with a Zio patch was recommended.    He recalls having a fever, scratchy throat, cough and swollen lymph nodes earlier this week, which resolved after a few days. He has since continued to experience some mild ear fullness and nasal congestion since then, but these are both improving. He has not been using Flonase recently. He requests a refill for Symbicort, noting that he does not have an allergy appointment until July.     He requests Descovy refill. His last HIV test was conducted in March, and he discusses the need for a metabolic panel and cholesterol check.    I have reviewed the problem list, medications, and allergies and have updated/reconciled them if needed.    Baldemar  reports that he has never smoked. He has never been exposed to tobacco smoke. He has never used smokeless tobacco.  Health Maintenance   Topic Date Due    DTaP/Tdap/Td Vaccines (2 - Td or Tdap) 08/19/2027    Lipid Screening  12/26/2027    Pneumococcal Vaccine 0-49  Completed    Hepatitis C Screen  Completed    COVID-19 Vaccine  Completed    Influenza Vaccine  Completed     Objective     VITALS: BP 127/81 (BP Site: L Arm, BP Position: Sitting, BP Cuff Size: Medium)  - Pulse 78  - Temp 37 ??C (98.6 ??F) (Oral)  - Resp 18  - Ht 162.6  cm (5' 4)  - Wt 69.8 kg (153 lb 12.8 oz) Comment: per pt - LMP 07/20/2017 (Exact Date)  - BMI 26.40 kg/m??     Physical Exam  General: well-appearing, sitting upright in no acute distress  Head: Normocephalic, atraumatic  ENT: No dental trauma noted. Mild nasal congestion.   Eyes: conjunctiva normal, non-erythematous, non-icteric, no discharge.  Neck: no thyroid enlargement or masses  CV: RRR, no MRG  Lungs: CTAB, no increased work of breathing or audible wheezing  Skin: Warm, dry, no erythema or rash on exposed skin. Suture in place on R proximal forearm near elbow, well-healing without erythema, warmth, crusting, TTP, or drainage.   Musculoskeletal: No visible gait abnormalities  Neurologic: Alert & oriented x 3, no gross sensorimotor abnormalities  Psychiatric: Pleasant, cooperative, good eye contact, appropriate thought processes    Wt Readings from Last 3 Encounters:   11/02/23 69.8 kg (153 lb 12.8 oz)   10/19/23 69.4 kg (153 lb)   10/19/23 69.8 kg (153 lb 12.8 oz)      PHQ-9 PHQ-9 Total Score   10/22/2020   9:00 AM 6   09/04/2020   9:00 AM 3   08/21/2020   9:00 AM 5   05/22/2020   3:00 PM 9     LABS/IMAGING: I have reviewed pertinent recent labs and imaging in Epic.    I personally spent a total of 30 minutes taking care of this patient including time with the patient, pre-visit planning, documentation, reviewing labs, coordinating care, and reviewing documentation from other providers in [facility] and in other specialties.  This was all done on the day of the visit.  My (and this clinic's) longitudinal relationship with this patient was instrumental in caring for this patient today including caring for their chronic medical and psychiatric conditions.      Jenny Mohs, MD, MPH (he/him)  [facility]  [address]   [place], [place] [address]  Phone: [phone]  Fax: [phone]

## 2023-11-03 LAB — HIV ANTIGEN/ANTIBODY COMBO: HIV ANTIGEN/ANTIBODY COMBO: NONREACTIVE

## 2023-11-07 MED ORDER — BUDESONIDE-FORMOTEROL HFA 160 MCG-4.5 MCG/ACTUATION AEROSOL INHALER
Freq: Two times a day (BID) | RESPIRATORY_TRACT | 5 refills | 61.00000 days | Status: CP
Start: 2023-11-07 — End: 2024-11-06

## 2023-11-08 MED ORDER — DESCOVY 200 MG-25 MG TABLET
ORAL_TABLET | Freq: Every day | ORAL | 1 refills | 90.00000 days | Status: CP
Start: 2023-11-08 — End: ?

## 2023-11-08 NOTE — Unmapped (Signed)
 [facility]  Psychiatry   Established Patient E&M Service - Outpatient                                                                             [facility] [place]    Name: Vincent Waters  Date: 11/08/2023  MRN: [id number]  DOB: 1981/03/17  PCP: Reeves Canter, MD    Assessment:    [facility] presents for follow-up evaluation. Pt reports he has been doing well - feels worsening attention and that providers/family and friends have mentioned to him their concern for worsening ADHD symptoms. Pt was previously on Adderall 20mg  BID (now on Qday), will increase back to BID dosage. Anxiety under good control. No safety concerns, follow up in 3 months.     Identifying Information:  Vincent Waters is a 43 y.o. adult with a history of GAD, Panic disorder, ADHD. Pt has long hx of anxiety, ADHD (since age 34), former dx of Bipolar disorder which has been unfounded.     Risk Assessment:  An assessment of suicide and violence risk factors was performed as part of this evaluation and is not significantly increased from the last visit.   While future psychiatric events cannot be accurately predicted, the patient does not currently require acute inpatient psychiatric care and does not currently meet [place]  involuntary commitment criteria.      Plan:    Problem: GAD/panic disorder  Status of problem: chronic and stable  Interventions:   -- continue Klonopin 0.25mg  PO BID for anxiety/panic  -- pt has therapist he was referred to, will make appt  -- pt has long hx of being on multiple past meds, did not respond well to SSRI's      Problem: ADHD  Status of problem: worsening  Interventions:   --INCREASE Adderall 20mg  PO BID, restarted 08/17/23, inc 11/09/23 (pt prefers immediate release over XR formulation). Pt was dx as child, started on Ritalin age 63 and in supportive classes at school.   -- does not like long acting stimulants, upset stomach      Psychotherapy provided:  No billable psychotherapy service provided.      There is no height or weight on file to calculate BMI.    Lab data:  Fasting Lipid Panel   Lab Results   Component Value Date    Cholesterol, Total 161 11/02/2023    Triglycerides 62 11/02/2023    Cholesterol, HDL 52 11/02/2023    Cholesterol, LDL, Calculated 99 11/02/2023     HbA1C   Lab Results   Component Value Date    Hemoglobin A1C 4.9 08/03/2017      PDMP reviewed: yes    Medical:  Male to male transgender person - on hormone therapy  Migraines  Asthma  Nondiabetic gastroparesis  GERD  Vit D deficiency  Hx of total hysterectomy  Hx of bilateral mastectomy     On HIV preventative for prep    PCP:  [place] - Dr. Arvie Latus     Labs:    Not indicated    Guardian:  self    Therapy:  Has external therapist - on leave but back in July  Sees  Dr. Arvie Latus every 2 weeks - able to talk to him about anything    Refills sent: for adderall, has adequate supply of klonopin     Scheduled follow up visit:  02/01/24 at 0900 30 min virtual visit with abby Tomislav Micale    Patient has been given information on how to contact this clinician for concerns. The patient has been instructed to call 911 for emergencies.      Subjective:    Interval History: Met with pt for re-evaluation, pt is pleasant and cooperative.     Been dizzy a lot, on cardiomonitor  Think it's pvc's  Been falling, stumble when walking    Mood chilled, anxiety under good control  Maintaining boundaries  Not letting others stress him out    Klonopin has been helpful    Worsening ADHD sx  Has gotten feedback from others that he is not focused, has been hyper, jumpy, talkative  Needs to be redirected during conversations  Used to be on Adderall 20mg  BID and it was helpful    Does not like longer acting stimulants  Upset stomach    Inc Adderall 20mg  PO BID     Up at 4am to drive [employer]    Denies SI/HI    Going to [place]  to visit a friend this summer, and then to [place]    Objective:    Mental Status Exam:  Appearance: Appears stated age, Well nourished, Well developed, and Clean/Neat   Motor:   No abnormal movements   Speech/Language:    Normal rate, volume, tone, fluency   Mood:   Good   Affect:   Calm, Cooperative, and Euthymic   Thought process and Associations:   Logical, linear, clear, coherent, goal directed   Abnormal/psychotic thought content:     Denies SI, HI, self harm, delusions, obsessions, paranoid ideation, or ideas of reference   Perceptual disturbances:     Denies auditory and visual hallucinations, behavior not concerning for response to internal stimuli     Other:          Visit was completed by video (or phone) and the appropriate disclaimer has been included below.      The patient reports they are physically located in [place]  and is currently: not at home.in car I conducted a audio/video visit. I spent  53m 57s on the video call with the patient. I spent an additional 8 minutes on pre- and post-visit activities on the date of service .       I personally spent 20 minutes face-to-face and non-face-to-face in the care of this patient, which includes all pre, intra, and post visit time on the date of service. All documented time was specific to E/M and does not include any procedures that may have been performed.    Leanne Pronto, PMHNP  Nov 09, 2023 11:02 AM

## 2023-11-09 ENCOUNTER — Encounter
Admit: 2023-11-09 | Discharge: 2023-11-10 | Payer: BLUE CROSS/BLUE SHIELD | Attending: Psychiatric/Mental Health | Primary: Psychiatric/Mental Health

## 2023-11-09 DIAGNOSIS — F902 Attention-deficit hyperactivity disorder, combined type: Principal | ICD-10-CM

## 2023-11-09 DIAGNOSIS — F411 Generalized anxiety disorder: Principal | ICD-10-CM

## 2023-11-09 MED ORDER — DEXTROAMPHETAMINE-AMPHETAMINE 20 MG TABLET
ORAL_TABLET | Freq: Two times a day (BID) | ORAL | 0 refills | 30.00000 days | Status: CP
Start: 2023-11-09 — End: 2023-12-09
  Filled 2023-11-14: qty 60, 30d supply, fill #0

## 2023-11-09 NOTE — Unmapped (Signed)
 Follow-up instructions:  -- Please continue taking your medications as prescribed for your mental health.   -- Do not make changes to your medications, including taking more or less than prescribed, unless under the supervision of your physician. Be aware that some medications may make you feel worse if abruptly stopped.  -- Please refrain from using illicit substances, as these can affect your mood and could cause anxiety or other concerning symptoms.   -- Seek further medical care for any increase in symptoms or new symptoms such as thoughts of wanting to hurt yourself or hurt others.     Contact info:  Life-threatening emergencies: call 911 or go to the nearest ER for medical or psychiatric attention.     Urgent questions, Non-urgent routine concerns, questions, and refill requests: please call the clinic at [phone].    Regarding appointments:  - If you need to cancel your appointment, we ask that you call  [phone]  at least 24 hours before your scheduled appointment.  - If for any reason you arrive 15 minutes later than your scheduled appointment time, you may not be seen and your visit may be rescheduled.  - Please remember that we will not automatically reschedule missed appointments.  - If you miss two (2) appointments without letting us  know in advance, you will likely be referred to a provider in your community.  - We will do our best to be on time. Sometimes an emergency will arise that might cause your clinician to be late. We will try to inform you of this when you check in for your appointment. If you wait more than 15 minutes past your appointment time without such notice, please speak with the front desk staff.    In the event of bad weather, the clinic staff will attempt to contact you, should your appointment need to be rescheduled. Additionally, you can call the Patient Weather Line [phone] for system-wide clinic status     For more information and reminders regarding clinic policies (these were provided when you were admitted to the clinic), please ask the front desk.      Current Outpatient Medications:     albuterol 2.5 mg /3 mL (0.083 %) nebulizer solution, Inhale 3 mL (2.5 mg total) by nebulization every six (6) hours as needed for wheezing or shortness of breath., Disp: 90 mL, Rfl: 1    azelastine (ASTELIN) 137 mcg (0.1 %) nasal spray, Instill 2 sprays into each nostril two (2) times a day as needed for rhinitis., Disp: 30 mL, Rfl: 1    budesonide-formoterol (SYMBICORT) 160-4.5 mcg/actuation inhaler, Inhale 1 puff by mouth Two (2) times a day., Disp: 10.2 g, Rfl: 5    cetirizine (ZYRTEC) 10 MG tablet, Take 1 tablet (10 mg total) by mouth daily., Disp: 30 tablet, Rfl: 12    clonazePAM (KLONOPIN) 0.5 MG tablet, Take 0.5 tablets (0.25 mg total) by mouth two (2) times a day as needed for anxiety., Disp: 30 tablet, Rfl: 5    dextroamphetamine-amphetamine (ADDERALL) 20 mg tablet, Take 1 tablet (20 mg total) by mouth two (2) times a day., Disp: 60 tablet, Rfl: 0    [START ON 12/09/2023] dextroamphetamine-amphetamine (ADDERALL) 20 mg tablet, Take 1 tablet (20 mg total) by mouth two (2) times a day., Disp: 60 tablet, Rfl: 0    [START ON 01/08/2024] dextroamphetamine-amphetamine (ADDERALL) 20 mg tablet, Take 1 tablet (20 mg total) by mouth two (2) times a day., Disp: 60 tablet, Rfl: 0    diclofenac sodium (  VOLTAREN) 1 % gel, Apply 2 g topically four (4) times a day., Disp: 100 g, Rfl: 0    emtricitabine-tenofovir alafen (DESCOVY) 200-25 mg tablet, Take 1 tablet by mouth daily., Disp: 90 tablet, Rfl: 1    EPINEPHrine (EPIPEN) 0.3 mg/0.3 mL injection, Inject 0.3 mL (0.3 mg total) under the skin once for 1 dose., Disp: 2 each, Rfl: 0    gabapentin (NEURONTIN) 250 mg/5 mL oral solution, Take 12 mL (600 mg total) by mouth Three (3) times a day., Disp: 1080 mL, Rfl: 2    inhalational spacing device Spcr, Use as directed with albuterol and symbicort, Disp: 1 each, Rfl: 1    ipratropium (ATROVENT) 42 mcg (0.06 %) nasal spray, 1-2 sprays in each nostril as needed for drainage, up to 4 time daily., Disp: 15 mL, Rfl: 2    metoclopramide (REGLAN) 10 MG tablet, Take 1/2 tablet (5 mg total) by mouth Three (3) times a day with a meal., Disp: 135 tablet, Rfl: 3    nebulizer and compressor (COMP-AIR NEBULIZER COMPRESSOR) Devi, 1 each by Miscellaneous route nightly as needed., Disp: 1 each, Rfl: 0    needle, disp, 25 Martice (BD REGULAR BEVEL NEEDLES) 25 Verlyn x 5/8 Ndle, For subcutaneous hormone injection., Disp: 25 each, Rfl: 0    nifedipine 0.3% lidocaine 1.5% in petrolatum ointment, Apply a pea-size amount to anal area 3 times a day, Disp: 100 g, Rfl: 0    omalizumab (XOLAIR) 300 mg/2 mL auto-injector, Inject the contents of 1 pen (300 mg) under the skin every fourteen (14) days., Disp: 4 mL, Rfl: 11    omalizumab 75 mg/0.5 mL AtIn, Inject the contents of 1 pen (75 mg) under the skin every fourteen (14) days., Disp: 1 mL, Rfl: 11    pantoprazole (PROTONIX) 40 MG tablet, Take 1 tablet (40 mg total) by mouth daily., Disp: 90 tablet, Rfl: 3    prucalopride (MOTEGRITY) 2 mg Tab, Take 1 tablet (2 mg total) by mouth daily., Disp: 90 tablet, Rfl: 3    safety needles (BD ECLIPSE) 25 Daion x 1 Ndle, use for testosterone, Disp: 12 each, Rfl: 0    safety needles (BD SAFETYGLIDE NEEDLE) 18 [place] x 1 1/2 Ndle, For drawing hormone injection, Disp: 25 each, Rfl: 0    syringe, disposable, (EASY TOUCH LUER LOCK SYRINGE) 1 mL Syrg, Use for weekly hormone injection., Disp: 4 each, Rfl: 0    syringe, disposable, (EASY TOUCH LUER LOCK SYRINGE) 1 mL Syrg, Use for testosterone, Disp: 12 each, Rfl: 0    syringe, disposable, 2.5 mL Syrg, Use weekly, Disp: 30 Syringe, Rfl: 2    testosterone cypionate (DEPOTESTOTERONE CYPIONATE) 200 mg/mL injection, Inject 0.2 mL (40 mg total) into the muscle once a week., Disp: 12 mL, Rfl: 0    triamcinolone (KENALOG) 0.1 % cream, Apply topically Two (2) times a day., Disp: 15 g, Rfl: 0    Current Facility-Administered Medications:     omalizumab (XOLAIR) injection 300 mg, 300 mg, Subcutaneous, Q28 Days, [name], [name], [name], 300 mg at 05/24/22 1138

## 2023-11-10 NOTE — Unmapped (Signed)
 [facility] Refill Coordination Note    Specialty Medication(s) to be Shipped:   CF/Pulmonary/Asthma: Xolair    Other medication(s) to be shipped: No additional medications requested for fill at this time     [facility], DOB: 01/01/1981  Phone: [phone] (home) [phone] (work)      All above HIPAA information was verified with patient.     Was a Nurse, learning disability used for this call? No    Completed refill call assessment today to schedule patient's medication shipment from the [facility]  [phone]).  All relevant notes have been reviewed.     Specialty medication(s) and dose(s) confirmed: Regimen is correct and unchanged.   Changes to medications: Birt reports no changes at this time.  Changes to insurance: No  New side effects reported not previously addressed with a pharmacist or physician: None reported  Questions for the pharmacist: No    Confirmed patient received a Conservation officer, historic buildings and a Surveyor, mining with first shipment. The patient will receive a drug information handout for each medication shipped and additional FDA Medication Guides as required.       DISEASE/MEDICATION-SPECIFIC INFORMATION        For patients on injectable medications: Patient currently has 0 doses left.  Next injection is scheduled for 11/15/23.    SPECIALTY MEDICATION ADHERENCE     Medication Adherence    Patient reported X missed doses in the last month: 0  Specialty Medication: XOLAIR 300 mg/2 mL auto-injector (omalizumab)  Patient is on additional specialty medications: Yes  Additional Specialty Medications: XOLAIR 75 mg/0.5 mL Atin (omalizumab)  Patient Reported Additional Medication X Missed Doses in the Last Month: 0  Patient is on more than two specialty medications: No  Any gaps in refill history greater than 2 weeks in the last 3 months: no  Demonstrates understanding of importance of adherence: yes              Were doses missed due to medication being on hold? No    XOLAIR 75 mg/ml: 0 days of medicine on hand   XOLAIR 300   mg/ml: 0 days of medicine on hand       REFERRAL TO PHARMACIST     Referral to the pharmacist: Not needed      [facility]     Shipping address confirmed in Epic.     Cost and Payment: Patient has a $0 copay, payment information is not required.    Delivery Scheduled: Yes, Expected medication delivery date: 11/14/23.     Medication will be delivered via Same Day Courier to the prescription address in Epic WAM.    Stephen Ehrlich   [facility]  Specialty Technician

## 2023-11-10 NOTE — Unmapped (Signed)
 [facility] [place]  OUTPATIENT PHYSICAL THERAPY  11/10/2023  Note Type: Treatment Note       Patient Name: Vincent Waters  Date of Birth:1980-07-17  Diagnosis:   Encounter Diagnoses   Name Primary?    Chronic right-sided low back pain without sciatica Yes    Low back pain radiating to right lower extremity     Tear of right acetabular labrum, subsequent encounter      Referring MD:  Reeves Canter,*     Date of Onset of Impairment-08/23/2016  Date PT Care Plan Established or Reviewed-08/24/2023  Date PT Treatment Started-08/24/2023   Visit Count: 10  Plan of Care Effective Date:          Assessment/Plan:    Assessment  Assessment details:    Vincent Waters has returned to baseline.  Resuming strength work.  He is tolerating it well and he is starting to improve with his hip strength nicely, although his core continues to fatigue (tends to rush to get through his core exercises and he's still limited with his plank times).  Progressing his core/hip strength within tolerance.  Recommended that he work his front plank up to 3 x 30 3-4x/week and he agreed to do this.  ll questions answered.  This patient requires skilled physical therapy services to address the outlined impairments in order to return to his desired level of function.       Impairments: pain, muscular restrictions, hypertonicity, core weakness, impaired ADLs, joint restriction, decreased strength and gait deviation                      Therapy Goals      Goals:      Goals:  In 12 weeks:  Goal #1: Patient will be independent with progressively monitored home exercise program.   Baseline/Current: Patient is unfamiliar with home exercise program.     Goal #2: Patient will demonstrate lumbar ROM within normal limits without pain in order to perform daily functional activities such as bending to pick an object off the ground, twisting to reach into the patient's backseat or bend backwards in order to lift an object overhead. Baseline/Current: Patient has painful lumbar ROM (see ROM testing).     Goal #3: Patient will demonstrate right hip strength within normal limits without pain in order to allow for functional tasks such as symmetrical walking, squatting and negotiating stairs.   Baseline/Current: Patient has limited right hip strength (see manual muscle testing).      Goal #4: Patient will be able to lift at least 75 lb from floor to waist with good lifting mechanics in order to demonstrate good core and trunk strength.   Baseline/Current: Patient has pain and difficulty lifting at present.    Goal #5: Patient will report improvement in symptoms while sitting and ability to sit up to 1 hour with minimal symptoms.  Baseline/Current: Patient has difficulty sitting at present.    Goal #6: Patient's FOTO score will improve from 34 at initial evaluation to >/= 53 at discharge in order to demonstrate a significant improvement in self-reported outcome measures that directly result from his course of physical therapy.   Baseline/Current: FOTO = 34    Plan    Therapy options: will be seen for skilled physical therapy services    Planned therapy interventions: 20560, 20561-Dry Needling 1-2, 3+ areas, 97010-Cold Packs/Hot Packs, 97012-Mechanical Traction, 97032, G0283-Electrical Stimulation (unattended, attended), 97110-Therapeutic Exercises, 97112-Neuromuscular Re-education, 97116-Gait Training, 97140-Manual  Therapy, 97164-Re-evaluation (PT) and 97530-Therapeutic Activities      Frequency: 1x week    Duration in weeks: 12    Education provided to: patient.    Education provided: Surveyor, minerals, HEP and Treatment options and plan    Education results: verbalized good understanding, needs reinforcement, demonstrates understanding and needs further instruction.    Communication/Consultation: N/A.    Next visit plan:        Manual therapy/TDN to calm lumbar musculature, right hip/core/trunk strengthening.    Total Session Time: 40 Treatment rendered today:      Therapeutic Exercise: 40 minutes  - Patient Education: Plan of Care, Home Exercise Program  - Side Lying Clam With Band: 2 x [15 x 3] (L6)  - Side Lying Hip Abduction: 2 x 15, 5 lb  - Fire Hydrants: 2 x 12, bilateral (L4)  - Supine TA/Pelvic Tilt: 20 x   - Supine TA/Pelvic Tilt/March: 20 x   - Supine TA/Pelvic Tilt/90-90 Heel Taps: 2 x 12, bilateral, alternating  - Supine TA/Dead Bug: 2 x 10, bilateral alternating  - Bird Dog: 2 x 10, bilateral, alternating  - Front Plank: 3 x 30    Plan details: Access Code: [email or web address]  URL: [email or web address]  Date: 10/18/2023  Prepared by: Irineo Manns    Exercises  - Supine Posterior Pelvic Tilt  - 1 x daily - 7 x weekly - 1 sets - 20 reps - 5 hold  - Sidelying Thoracolumbar Mobilisation with Towel Roll  - 2 x daily - 7 x weekly - 1 sets - 1 reps - 5 minutes hold  - Clamshell with Resistance (Mirrored)  - 1 x daily - 7 x weekly - 2 sets - 10 reps - 3 hold  - Sidelying Hip Abduction (Mirrored)  - 1 x daily - 7 x weekly - 2 sets - 10 reps  - Hooklying Transversus Abdominis Palpation  - 1 x daily - 7 x weekly - 1 sets - 15 reps - 5 hold  - Supine March  - 1 x daily - 7 x weekly - 2 sets - 10 reps  - Supine 90/90 Alternating Toe Touch  - 1 x daily - 7 x weekly - 2 sets - 10 reps  - Full Plank  - 1 x daily - 7 x weekly - 1 sets - 3 reps - 30-45 hold  - Supine Bridge with Leg Extension  - 1 x daily - 7 x weekly - 2 sets - 10 reps - 5 hold  - Bird Dog  - 1 x daily - 7 x weekly - 2 sets - 10 reps  - Supine Dead Bug with Leg Extension  - 1 x daily - 7 x weekly - 2 sets - 10 reps  - Quadruped Hip Abduction with Resistance Loop  - 1 x daily - 7 x weekly - 2 sets - 10 reps      Subjective:     History of Present Condition     Date of onset:  08/23/2016    Date is approximation?: yes    Explanation:  Patient reports onset of symptoms in 2018.    History of Present Condition/Chief Complaint:  [name] is a 43 y.o. right hand dominant adult (male to male transition in process) who presents to outpatient physical therapy with chronic low back pressure, tightness and pain that began in 2018.  Patient cannot remember any specific traumatic mechanism of injury that may have been the result of his  symptoms, but he does report that he broke his right leg 2x when he was younger.  The first one was when he was a baby.  The second time he broke it falling off the couch when he was 8.  He can't remember if it was his femur or tibia and he hasn't had a recent x-ray.  Subjective:  Mr. Brandel reports that he isn't feeling too bad.  His grandma got him a foam seat and that's been helping his back when he drives.  He is doing his exercises consistently.  Pain:     Current pain rating:  0    Progression:  Improved      Objective:       Palpation     Right Lumbar   Hypertonic in the erector spinae, lumbar paraspinals and quadratus lumborum.    Joint Mobility  Lumbar  Lumbar Joint Mobility   Hypomobile at L4 and L5.   Pain at L4 and L5.                                   I attest that I have reviewed the above information.  Signed: Willistine Hartigan, PT  11/10/2023 11:07 AM

## 2023-11-14 MED FILL — SYMBICORT 160 MCG-4.5 MCG/ACTUATION HFA AEROSOL INHALER: RESPIRATORY_TRACT | 30 days supply | Qty: 10.2 | Fill #0

## 2023-11-14 MED FILL — XOLAIR 300 MG/2 ML SUBCUTANEOUS AUTO-INJECTOR: SUBCUTANEOUS | 28 days supply | Qty: 4 | Fill #9

## 2023-11-14 MED FILL — XOLAIR 75 MG/0.5 ML SUBCUTANEOUS AUTO-INJECTOR: SUBCUTANEOUS | 28 days supply | Qty: 1 | Fill #9

## 2023-11-15 NOTE — Unmapped (Unsigned)
 Return TOS    Patient Name: Vincent Waters DOB:10-06-1980  Date of Encounter: 11/15/2023    Assessment:  Patient is a 43 y.o. male s/p right TOS decompression with pec minor release 11/16/21. Patient ***.     Plan:  RTC in *** with ***. Return precautions reviewed.     HPI: Vincent Waters is a 43 y.o. 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 Patient reports ***.     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 For 4 years, he has had limited shoulder mobility, right neck pain and stiffness, shoulder pain radiating down the arm, and tingling and cramping in the fingers in an ulnar distribution. (Patient initially complained of RUE sx but at follow up endorses a component of LUE sx as well). He was previously seen at [facility] and had glenohumeral injections and PT for possible frozen shoulder with some improvement. Patient saw Dr. Sibyl Drafts in Ortho in 09/2019 who felt sx were possibly 2/2 TOS vs. cervical radiculopathy. EMG was ordered at that time but never obtained. Patient has also considered rotator cuff surgery with a provider in [place]. Previous shoulder MRIs have found mild tendinosis of supraspinatus tendon. Patient has tried PT and dry needling without relief.     Past Medical History:   Past Medical History:   Diagnosis Date    Anxiety     Arthritis     Asthma (HHS-HCC)     Autoimmune autonomic neuropathy     Breast cyst     Depression     Financial difficulties     Food intolerance March 2018    Headache, tension-type     Lack of access to transportation     Migraines     Neuromuscular disorder       Rectal bleeding     Restless leg syndrome     Scleroderma       Seizures   07/27/2014    [facility]    Sleep apnea treated with continuous positive airway pressure (CPAP)     Visual impairment        Past Surgical History:  Past Surgical History:   Procedure Laterality Date    BREAST LUMPECTOMY      CHOLECYSTECTOMY  08/29/2012    COSMETIC SURGERY      HYSTERECTOMY Bilateral     08/2017    MASTECTOMY      04/2018    OOPHORECTOMY  2005    laparotomy    PR CYSTOURETHROSCOPY N/A 08/30/2017    Procedure: CYSTOURETHROSCOPY (SEPARATE PROCEDURE);  Surgeon: Fransico Ivy, MD;  [facility];  Service: Advanced Laparoscopy    PR EXCISION TURBINATE,SUBMUCOUS Bilateral 01/04/2021    Procedure: SUBMUCOUS RESECTION INFERIOR TURBINATE, PARTIAL OR COMPLETE, ANY METHOD;  Surgeon: Adam Swaziland Kimple, MD;  [facility]: [facility];  Service: ENT    PR INCISE TENDON/MUSCLE,SHLDR,SINGLE Right 11/16/2021    Procedure: RIGHT PEC MINOR RELEASE;  Surgeon: Abbey Hobby, MD;  Location: MAIN OR [place];  Service: Vascular    PR LAPAROSCOPY W TOT HYSTERECTUTERUS <=250 GRAM  W TUBE/OVARY N/A 08/30/2017    Procedure: LAPAROSCOPY, SURGICAL, WITH TOTAL HYSTERECTOMY, FOR UTERUS 250 G OR LESS; W/REMOVAL TUBE(S) AND/OR OVARY(S);  Surgeon: Audrea Learned  Kelby Patches, MD;  Location: [facility] OR [facility];  Service: Advanced Laparoscopy    PR MASTECTOMY, SIMPLE, COMPLETE Bilateral 01/09/2020    Procedure: MASTECTOMY, SIMPLE, COMPLETE;  Surgeon: Adelene Homer, MD;  Location: MAIN OR [place];  Service: Plastics    PR NEUROPLASTY BRACHIAL PLEXUS,OPEN Right 11/16/2021    Procedure: RIGHT TOS DECOMPRESSION;  Surgeon: Abbey Hobby, MD;  Location: MAIN OR [place];  Service: Vascular    PR REPAIR OF NASAL SEPTUM N/A 01/04/2021    Procedure: SEPTOPLASTY OR SUBMUCOUS RESECTION, WITH OR WITHOUT CARTILAGE SCORING, CONTOURING OR REPLACEMENT WITH GRAFT;  Surgeon: Adam Swaziland Kimple, MD;  Location: [facility] OR [facility];  Service: ENT    PR UPPER GI ENDOSCOPY,BIOPSY N/A 08/13/2019    Procedure: UGI ENDOSCOPY; WITH BIOPSY, SINGLE OR MULTIPLE;  Surgeon: Roylene Corn, MD; [facility]: [facility];  Service: Gastroenterology    PR UPPER GI ENDOSCOPY,BIOPSY N/A 03/10/2023    Procedure: UGI ENDOSCOPY; WITH BIOPSY, SINGLE OR MULTIPLE;  Surgeon: Darnella Elder, MD;  Location: GI PROCEDURES [facility];  Service: Gastroenterology    SINUS SURGERY  12/20    Turbinate reduction       Social History:  Patient  reports that he has never smoked. He has never been exposed to tobacco smoke. He has never used smokeless tobacco. He reports that he does not currently use alcohol. He reports that he does not currently use drugs.    ROS:  Balance of systems reviewed x 10 and negative except as noted in HPI.    DASH Score  Date    11/16/2023     Physical Examination:   There were no vitals filed for this visit.  General: Well appearing, no acute distress, neuro intact  Psych: Awake, Alert , Oriented by 3. Affect is appropriate.  Neck: Supple, no evidence of JVD, no evidence of thyromegaly, no cervical bruits  Cardiac: RRR  Lungs: Easy work of breathing, CTA Bilaterally  Upper extremities: Warm and well perfused. Radial artery pulses bilaterally 2+. Limited RUE ROM and shoulder mobility.     Imaging (independently reviewed):    None

## 2023-11-17 ENCOUNTER — Ambulatory Visit: Admit: 2023-11-17 | Discharge: 2023-11-18 | Payer: BLUE CROSS/BLUE SHIELD

## 2023-11-17 DIAGNOSIS — R519 Chronic intractable headache, unspecified headache type: Principal | ICD-10-CM

## 2023-11-17 DIAGNOSIS — G8929 Other chronic pain: Principal | ICD-10-CM

## 2023-11-17 DIAGNOSIS — R42 Dizziness and giddiness: Principal | ICD-10-CM

## 2023-11-17 MED ORDER — VERAPAMIL 40 MG TABLET
ORAL_TABLET | ORAL | 2 refills | 37.00000 days | Status: CP
Start: 2023-11-17 — End: 2023-12-29
  Filled 2023-11-27: qty 60, 37d supply, fill #0

## 2023-11-17 NOTE — Unmapped (Signed)
 [facility] Neurology Clinic Summary     [facility]  [facility] [address] [place]  [address]  [place] [address]  [phone]    Date: 11/17/2023  Patient Name: Vincent Waters  MRN: [id number]  PCP: Vincent Conn*            [name] is a 43 y.o.  adult seen in consultation at the [facility] Clinics at the request of Vincent Waters for evaluation of headache.      Assessment & Plan:      Diagnosis ICD-10-CM Associated Orders   1. Chronic intractable headache, unspecified headache type  R51.9 verapamil (CALAN) 40 MG tablet    G89.29       2. Dizziness  R42           Assessment & Plan  Headache  Throbbing and pounding headaches primarily located behind the right eye, lasting from a few minutes to a few hours, occurring three to four times a day. These episodes are accompanied by light and sound sensitivity but no nausea. No autonomic features. Sometimes he will see zigzap out of his right eye before and during the headache. It is moderate to severe intensity. Sudden onset. He is currently taking gabapentin 600 mg three times a day, which provides some relief but wears off, leading to the return of headaches. He also reports electrical sensations in his eyes and face and sometimes can be tendered on the face. He has a good number of risk factors for headache: chronic right shoulder issue, chronic untreated sleep apnea/CPAP noncompliance, stress, TMJ, bruxism,  Differential includes spinal accessory neuralgia, trigeminal neuralgia, tension type headache, migraine headache, TACs ie cluster headache, paroxysmal hemicrania but no autonomic signs. MRI 07/2023 showed chronic microvascular ischemic change. No structural lesions. MRA head 07/2023 showed no aneurysm or cerebral vasoconstriction. Has seen PT whose notes stating pt got relief and responds well to Manual Therapy. It's becoming apparent that his headaches seem to be related to the muscular issues in his neck and shoulder. During TDN, he describes the onset of a headache, but it seems to fizzle out once the needling is done. He is seeing pain clinic for chronic pain, TOS and myofascial pain syndrome regularly. Exam is unremarkable.   - trial of Verapamil. 40mg  nightly x1 wk, then 40mg  BID. Counseled on side effects.   - If no response, headache speciality clinic.   - Follow-up with sleep medicine for untreated sleep apnea.  - Continue to follow up with psychiatrist for anxiety, panic disorder, ADHD.   - Continue to follow up with pain clinic    Prior meds  Maxalt  Naproxen - gastroparesis  Topiramate  Valproate - nausea  Anticholingeric - avoid due to gastroparesis  Reglan  bustrans/buprenorphine   Narcotics  Benzos      Dizziness and Balance Issues  Chronic dizziness with balance issues, no vertigo or tinnitus. Has seen multiple providers for this over the years. ENT 06/2023 Septal deviation to the left with bilateral swell bodies. All sinuses appear healthy. ITH. Normal audiogram. ENT suggested him to be off clonazepam. Neuropathy workups in 2021 showed autoimmune panel significant for positive Scl 70 antibody with negative on repeat, mildly elevated CK. MRI thighs and MRIs CTL spine reassuring. EMG 07/2020 electrodiagnostic findings are suggestive of a chronic left L2-L3 lumbar radiculopathy. There is no electrodiagnostic evidence of a large fiber  polyneuropathy, left arm or leg compression neuropathy, or myopathic changes. Rheumatology 02/2021 He does not have evidence of inflammatory arthritis or other symptoms to suggest a systemic connective tissue disease. Recent MRI/MRA 07/2023 were unrevealing. No brainstem signs: no ataxic gait, vomiting, double vision, visual loss, speech problem or incoordination. Exam is unremarkable today. No parkinsonism. No sensory deficits. Unknown etiology. Ddx: Vestibular dysfunction, medication side effects, functional.    - Continue to follow up with Cardiology      Thank you very much for this consultation. Please let us  know if any questions or concerns.     Vincent Folds, FNP    Note forwarded to Vincent Waters for review.    I personally spent 60 minutes face-to-face and non-face-to-face in the care of this patient, which includes all pre, intra, and post visit time on the date of service.  All documented time was specific to the E/M visit and does not include any procedures that may have been performed.      CC: Vincent Waters*    Subjective:     Chief Concern: Vincent Waters is a 43 y.o. adult who comes for consultation and evaluation from Vincent Waters regarding headache.    The history is obtained from the medical record and patient .    History of Present Illness:     History of Present Illness  He experiences throbbing and pounding headaches primarily located behind the right eye, lasting from a few minutes to a few hours, occurring three to four times a day. These episodes are accompanied by light and sound sensitivity but no nausea. No autonomic features. Sometimes he will see zigzap out of his eye before and during the headache. It is moderate to severe intensity. Sudden onset. He is currently taking gabapentin 600 mg three times a day, which provides some relief but wears off, leading to the return of headaches. He also reports electrical sensations in his eyes and face.    He has a history of sleep apnea diagnosed in 2022 but has been unable to use CPAP due to mask issues and dental problems. Has been CPAP noncompliant. He reports waking up with headaches, feeling drowsy, and not sleeping well. He has a history of sinus issues and has undergone turbinate reduction and septum correction surgeries. Despite these interventions, he continues to experience ear fluid and sinus congestion. States he He can't hear well. No visual disturbances. He has been under the care of an ENT for three years, who has exhausted current treatment options. He was also recommended to talk to ENT about Vincent Waters [place] but was told he is not eligible.    He has a history of bruxism, leading to dental issues and the recommendation for a mouth guard, though this is complicated by his dental condition. His teeth grinding is severe, leading to significant wear on his teeth.    He has been experiencing dizziness described as a balance issue rather than vertigo, sometimes leading to falls. He notes a sensation of his head shifting and feeling heavy. He is currently on a heart monitor to assess for potential cardiac causes of dizziness. No room spinning or tinnitus with dizziness.     He reports numbness and tingling in his hands and feet since 2019, with symptoms extending from the wrist to the fingertips and from the ankle to the toes. He has been evaluated by rheumatology and neurology, with high ANA levels and a marker for scleroderma, but no specific diagnosis. EMG  and nerve conduction studies did not reveal significant findings. Multiple MRIs brain, neck, thorax, and lumbar, and MRA brain were all not able to explain his symptoms.         Current meds  Gabapentin 600mg  TID  Reglan      Failed meds  Indomethacin - gastroparesis      MRI brain with and without contrast 08/26/2023  Impression   Few foci of subcortical white matter T2/FLAIR high signal, unchanged from prior exam and may reflect chronic microvascular ischemic change.      Borderline low-lying cerebellar tonsils.   MRA Head W Wo Contrast 08/26/2023  FINDINGS:   The visualized portions of the intracranial internal carotid, vertebral and basilar arteries are unremarkable.   There is no high grade stenosis.   No aneurysm visualized.     EMG 2021  Conclusions:  Abnormal study. These electrodiagnostic findings are suggestive of a chronic left L2-L3 lumbar radiculopathy. There is no electrodiagnostic evidence of a large fiber polyneuropathy, left arm or leg compression neuropathy, or myopathic changes.     Note from Vincent Waters 07/25/2023  Headaches   - has had them for many years  - all over his head  - manageable      Headaches have changed to a thunderclap type of sensation in which he feels like he is sucker punched or stabbed on the right side of the headache. These symptoms go away within a few seconds. It can happen 6-7 times a day. This has been going on for about the lat 6 months.      Currently gabapentin [has stopped propranolol], ibuprofen does not help and does not sit well with stomach due to gastroparesis. Uses gabapentin for nerve pain in the setting of thoracic outlet syndrome [600 mg three times a day, he only takes the nighttime dose, he tolerates it very well].      He has noted that there is some vertigo happening intermittently. His mother does suffer from this.      No history of aneuryms or similar issues in family.       Outside records and prior workup has been reviewed (see Media section).     Past Medical History: He  has a past medical history of Anxiety, Arthritis, Asthma (HHS-HCC), Autoimmune autonomic neuropathy, Breast cyst, Depression, Financial difficulties, Food intolerance (March 2018), Headache, tension-type, Lack of access to transportation, Migraines, Neuromuscular disorder, Rectal bleeding, Restless leg syndrome, Scleroderma, Seizures (07/27/2014), Sleep apnea treated with continuous positive airway pressure (CPAP), and Visual impairment.    Past Surgical History:   Past Surgical History:   Procedure Laterality Date    BREAST LUMPECTOMY      CHOLECYSTECTOMY  08/29/2012    COSMETIC SURGERY      HYSTERECTOMY Bilateral     08/2017    MASTECTOMY      04/2018    OOPHORECTOMY  2005    laparotomy    PR CYSTOURETHROSCOPY N/A 08/30/2017    Procedure: CYSTOURETHROSCOPY (SEPARATE PROCEDURE);  Surgeon: Fransico Ivy, Waters;  [facility];  Service: Advanced Laparoscopy    PR EXCISION TURBINATE,SUBMUCOUS Bilateral 01/04/2021    Procedure: SUBMUCOUS RESECTION INFERIOR TURBINATE, PARTIAL OR COMPLETE, ANY METHOD;  Surgeon: Adam Swaziland Kimple, Waters;  Location: [facility] OR [facility];  Service: ENT    PR INCISE TENDON/MUSCLE,SHLDR,SINGLE Right 11/16/2021    Procedure: RIGHT PEC MINOR RELEASE;  Surgeon: Abbey Hobby, Waters;  Location: MAIN OR [facility];  Service: Vascular    PR LAPAROSCOPY W TOT  HYSTERECTUTERUS <=250 GRAM  W TUBE/OVARY N/A 08/30/2017    Procedure: LAPAROSCOPY, SURGICAL, WITH TOTAL HYSTERECTOMY, FOR UTERUS 250 G OR LESS; W/REMOVAL TUBE(S) AND/OR OVARY(S);  Surgeon: Fransico Ivy, Waters;  [facility]: [facility];  Service: Advanced Laparoscopy    PR MASTECTOMY, SIMPLE, COMPLETE Bilateral 01/09/2020    Procedure: MASTECTOMY, SIMPLE, COMPLETE;  Surgeon: Adelene Homer, Waters;  Location: MAIN OR [place];  Service: Plastics    PR NEUROPLASTY BRACHIAL PLEXUS,OPEN Right 11/16/2021    Procedure: RIGHT TOS DECOMPRESSION;  Surgeon: Abbey Hobby, Waters;  Location: MAIN OR [facility];  Service: Vascular    PR REPAIR OF NASAL SEPTUM N/A 01/04/2021    Procedure: SEPTOPLASTY OR SUBMUCOUS RESECTION, WITH OR WITHOUT CARTILAGE SCORING, CONTOURING OR REPLACEMENT WITH GRAFT;  Surgeon: Adam Swaziland Kimple, Waters;  Location: [facility] OR [facility];  Service: ENT    PR UPPER GI ENDOSCOPY,BIOPSY N/A 08/13/2019    Procedure: UGI ENDOSCOPY; WITH BIOPSY, SINGLE OR MULTIPLE;  Surgeon: Roylene Corn, Waters;  Location: [facility];  Service: Gastroenterology    PR UPPER GI ENDOSCOPY,BIOPSY N/A 03/10/2023    Procedure: UGI ENDOSCOPY; WITH BIOPSY, SINGLE OR MULTIPLE;  Surgeon: Darnella Elder, Waters;  Location: [facility] PROCEDURES [facility];  Service: Gastroenterology    SINUS SURGERY  12/20    Turbinate reduction       Family History: His family history includes Breast cancer in his maternal grandmother and sister; Cancer in his mother and sister; Cancer (age of onset: 44) in his maternal uncle; Cataracts in his maternal grandmother; Hypertension in his father; Hypothyroidism in his mother; Immunodeficiency in his mother; Migraines in his mother; Ovarian cancer in his maternal grandmother; Seizures in his mother; Thyroid disease in his mother.    Social history: He  reports that he has never smoked. He has never been exposed to tobacco smoke. He has never used smokeless tobacco. He reports that he does not currently use alcohol. He reports that he does not currently use drugs.     Medication Reconciliation: In conjunction with entering the patient's initial or transfer medications, I performed medication reconciliation based on information available to me at the time.    Past Medical Hx, Family Hx, Social Hx, and Problem List has been reviewed in the [employer].    REVIEW OF SYSTEMS: 11+ review of systems was reviewed. Pertinent positives are detailed in the HPI.     Objective:     Vital Signs: His height is 162.6 cm (5' 4) and weight is 68.9 kg (152 lb). His temporal temperature is 36.9 ??C (98.4 ??F). His blood pressure is 113/72 and his pulse is 88.   Pain score-       11/17/23 1052   PainSc: 4    /10      Eyes:      General: Lids are normal.      Extraocular Movements: Extraocular movements intact.   Neurological:      Mental Status: Oriented to person, place and time.      Coordination: Romberg sign negative.      Deep Tendon Reflexes:      Reflex Scores:       Tricep reflexes are 2+ on the right side and 2+ on the left side.       Bicep reflexes are 2+ on the right side and 2+ on the left side.       Brachioradialis reflexes are 2+ on the right side and 2+ on the left side.  Patellar reflexes are 2+ on the right side and 2+ on the left side.       Achilles reflexes are 2+ on the right side and 2+ on the left side.  Psychiatric:         Speech: Speech normal.             Neurological Exam  Mental Status  Awake, alert and oriented to person, place and time. Oriented to person, place and time. Speech is normal. Language is fluent with no aphasia.    Cranial Nerves  CN II: Visual acuity is normal. Visual fields full to confrontation.  CN III, IV, VI: Extraocular movements intact bilaterally. Normal lids and orbits bilaterally.   Right pupil: 4 mm. Round. Reactive to light. Reactive to accommodation.   Left pupil: 4 mm. Round. Reactive to light. Reactive to accommodation.  CN V: Facial sensation is normal.  CN VII: Full and symmetric facial movement.  CN VIII: Hearing is normal.  CN IX, X: Palate elevates symmetrically. Normal gag reflex.  CN XI:  Right: Sternocleidomastoid strength is normal. Trapezius strength is normal.  Left: Sternocleidomastoid strength is normal. Trapezius strength is normal.  CN XII: Tongue midline without atrophy or fasciculations.    Motor  Normal muscle bulk throughout. No fasciculations present. Normal muscle tone. No pronator drift.                                             Right                     Left  Neck flexion                           5                           Neck extension                      5                            Shoulder adduction               5                          5  Elbow flexion                         5                          5  Elbow extension                    5                          5  Wrist flexion                           5  5  Wrist extension                      5                          5  Finger flexion                         5                          5  Finger extension                    5                          5  Finger abduction                    5                          5  Thumb abduction                   5                          5  Hip flexion                              5                          5  Knee flexion                           5                          5  Knee extension                      5                          5  Ankle inversion                      5 5  Ankle eversion                       5                          5  Plantarflexion                         5                          5  Dorsiflexion                            5  5    Sensory  Light touch is normal in upper and lower extremities. Pinprick is normal in upper and lower extremities. Vibration is normal in upper and lower extremities. Proprioception is normal in upper and lower extremities.     Reflexes                                            Right                      Left  Brachioradialis                    2+                         2+  Biceps                                 2+                         2+  Triceps                                2+                         2+  Patellar                                2+                         2+  Achilles                                2+                         2+  Right Plantar: downgoing  Left Plantar: downgoing    Right pathological reflexes: Hoffmann's absent.  Left pathological reflexes: Hoffmann's absent.    Coordination  Right: Finger-to-nose normal. Heel-to-shin normal.Left: Finger-to-nose normal. Heel-to-shin normal.    Gait  Casual gait: Wide stance. Normal tandem gait. Romberg is absent. Able to rise from chair without using arms.                 Diagnostic Studies and Review of Records   Labs:  Lab Results   Component Value Date    Hemoglobin A1C 4.9 08/03/2017    TSH 0.632 12/26/2022    TSH 1.233 04/21/2021    TSH 0.614 08/25/2020    TSH 1.08 12/27/2010    Free T4 1.15 06/05/2020    Free T4 1.20 01/22/2020    Folate 5.4 06/24/2021    Folate 8.6 06/05/2020    Vitamin B-12 297 06/24/2021    Vitamin B-12 594 06/24/2020    Iron 69 06/24/2021    Iron 78 07/04/2019    Ferritin 38.9 06/24/2021    Ferritin 14.4 (L) 07/04/2019    Antinuclear Antibodies (ANA) Positive (A) 11/17/2022    ENA Screen 0.30 02/23/2021  Zinc 0.80 06/05/2020    HIV Antigen/Antibody Combo Nonreactive 11/02/2023    RPR Nonreactive 09/12/2023 Hepatitis C Ab Nonreactive 05/25/2020    Hep B Surface Ag Nonreactive 05/25/2020       Lab Results   Component Value Date/Time    RF 4.4 08/25/2020 09:00 AM    ESR 17 (H) 01/19/2023 03:35 PM    ESR 7 12/26/2022 08:45 AM    ESR 2 08/25/2020 09:00 AM    CRP 7.0 01/19/2023 03:35 PM    CRP 5.0 12/26/2022 08:45 AM    CRP <4.0 08/25/2020 09:00 AM    DSDNAAB Negative 02/23/2021 02:36 PM       Imaging and procedure:

## 2023-11-17 NOTE — Unmapped (Addendum)
-   please schedule an appt with sleep medicine for sleep apnea.   - start verapamil 40mg  nightly x2 wks, then increase to 40mg  twice per day.   - Needs to monitor HR and BP   - Side Effects: Orthostatic hypotension, dizziness, cough

## 2023-11-24 NOTE — Unmapped (Signed)
 [facility] THERAP[facility] SERVICES AT [place]   OUTPATIENT PHYSICAL THERAPY  11/24/2023  Note Type: Treatment Note       Patient Name: Vincent Waters  Date of Birth:02/02/1981  Diagnosis:   Encounter Diagnoses   Name Primary?    Chronic right-sided low back pain without sciatica Yes    Low back pain radiating to right lower extremity      Referring MD:  Reeves Canter,*     Date of Onset of Impairment-08/23/2016  Date PT Care Plan Established or Reviewed-08/24/2023  Date PT Treatment Started-08/24/2023   Visit Count: 11  Plan of Care Effective Date:          Assessment/Plan:    Assessment  Assessment details:    Focused on manual therapy to calm symptoms.  Immediately afterwards, he stated he felt looser and had less pain.  I suspect that he is getting left hip compensation to take pressure off his right low back and it's causing over activation on his left side.  Educated him on stretches and isometrics for pain relief.  Written instructions given for these exercises.  He committed to performing them consistently as part of his home exercise program.   All questions answered.  This patient requires skilled physical therapy services to address the outlined impairments in order to return to his desired level of function.       Impairments: pain, muscular restrictions, hypertonicity, core weakness, impaired ADLs, joint restriction, decreased strength and gait deviation                      Therapy Goals      Goals:      Goals:  In 12 weeks:  Goal #1: Patient will be independent with progressively monitored home exercise program.   Baseline/Current: Patient is unfamiliar with home exercise program.     Goal #2: Patient will demonstrate lumbar ROM within normal limits without pain in order to perform daily functional activities such as bending to pick an object off the ground, twisting to reach into the patient's backseat or bend backwards in order to lift an object overhead.   Baseline/Current: Patient has painful lumbar ROM (see ROM testing).     Goal #3: Patient will demonstrate right hip strength within normal limits without pain in order to allow for functional tasks such as symmetrical walking, squatting and negotiating stairs.   Baseline/Current: Patient has limited right hip strength (see manual muscle testing).      Goal #4: Patient will be able to lift at least 75 lb from floor to waist with good lifting mechanics in order to demonstrate good core and trunk strength.   Baseline/Current: Patient has pain and difficulty lifting at present.    Goal #5: Patient will report improvement in symptoms while sitting and ability to sit up to 1 hour with minimal symptoms.  Baseline/Current: Patient has difficulty sitting at present.    Goal #6: Patient's FOTO score will improve from 34 at initial evaluation to >/= 53 at discharge in order to demonstrate a significant improvement in self-reported outcome measures that directly result from his course of physical therapy.   Baseline/Current: FOTO = 34    Plan    Therapy options: will be seen for skilled physical therapy services    Planned therapy interventions: 20560, 20561-Dry Needling 1-2, 3+ areas, 97010-Cold Packs/Hot Packs, 97012-Mechanical Traction, 97032, G0283-Electrical Stimulation (unattended, attended), 97110-Therapeutic Exercises, 97112-Neuromuscular Re-education, 97116-Gait Training, 97140-Manual Therapy, 97164-Re-evaluation (PT) and 97530-Therapeutic Activities  Frequency: 1x week    Duration in weeks: 12    Education provided to: patient.    Education provided: Surveyor, minerals, HEP and Treatment options and plan    Education results: verbalized good understanding, needs reinforcement, demonstrates understanding and needs further instruction.    Communication/Consultation: N/A.    Next visit plan:        Manual therapy/TDN to calm lumbar musculature, right hip/core/trunk strengthening.    Total Session Time: 35    Treatment rendered today: Therapeutic Exercise: 10 minutes  - Patient Education: Plan of Care, Home Exercise Program  - Seated Upper Trapezius Stretch  - Seated SCM Stretch  - Supine Lateral Hip Stretch  - Supine Bilateral Isometric ER with Mob Belt  - Supine bilateral Isometric abduction with mob belt    Manual Therapy: 25 minutes  - Soft Tissue Mobilization right scalenes and right SCM, including pin and stretch  - Right proximal 1st rib mobilization,m including pin and stretch  - Left Gluteus Medius/minimus Soft Tissue Mobilization  - PT Assisted Left Lateral Hip Stretch  Vincent Waters  was informed of the risks associated with dry needling and he signed consent form.  Patient screened for any contraindications to needling including pregnancy, infection, antibiotic use, anticoagulants use, autoimmune issues, uncontrolled diabetes, and active cancer/treatment and does not have any of these conditions.  DRY NEEDLING FOR RELEASING IDENTIFIED TRIGGER POINTS TO FACILITATE MANUAL THERAPY TECHNIQUES AND ALLOW MUSCLE TO RETURN TO OPTIMAL LENGTH (NO CHARGE).  Right upper trapezius trigger point with 50mm x .30mm with Pinch/Pull Technique established for safety - Local Twitch Response produced     Right Scalene trigger point with 30mm x .30mm with Flat Palpation/Direct Approach established for safety - Local Twitch Response produced   Right SCM trigger point with 30mm x .30mm with Pinch/Pull Technique established for safety - Local Twitch Response produced  Left gluteus medius trigger point with 50mm x .30mm with Bony Backdrop established for safety - Local Twitch Response produced     Plan details: Access Code: [email or web address]  URL: [email or web address]  Date: 11/24/2023  Prepared by: Irineo Manns    Exercises  - Supine Posterior Pelvic Tilt  - 1 x daily - 7 x weekly - 1 sets - 20 reps - 5 hold  - Sidelying Thoracolumbar Mobilisation with Towel Roll  - 2 x daily - 7 x weekly - 1 sets - 1 reps - 5 minutes hold  - Clamshell with Resistance (Mirrored)  - 1 x daily - 7 x weekly - 2 sets - 10 reps - 3 hold  - Sidelying Hip Abduction (Mirrored)  - 1 x daily - 7 x weekly - 2 sets - 10 reps  - Hooklying Transversus Abdominis Palpation  - 1 x daily - 7 x weekly - 1 sets - 15 reps - 5 hold  - Supine March  - 1 x daily - 7 x weekly - 2 sets - 10 reps  - Supine 90/90 Alternating Toe Touch  - 1 x daily - 7 x weekly - 2 sets - 10 reps  - Full Plank  - 1 x daily - 7 x weekly - 1 sets - 3 reps - 30-45 hold  - Supine Bridge with Leg Extension  - 1 x daily - 7 x weekly - 2 sets - 10 reps - 5 hold  - Bird Dog  - 1 x daily - 7 x weekly - 2 sets - 10 reps  - Supine Dead Bug with  Leg Extension  - 1 x daily - 7 x weekly - 2 sets - 10 reps  - Quadruped Hip Abduction with Resistance Loop  - 1 x daily - 7 x weekly - 2 sets - 10 reps  - Sternocleidomastoid Stretch  - 1 x daily - 7 x weekly - 1 sets - 3 reps - 30 hold  - Supine Piriformis Stretch with Foot on Ground  - 1 x daily - 7 x weekly - 3 sets - 10 reps  - Supine Bilateral Isometric ER With Mobilization Belt  - 1 x daily - 7 x weekly - 15 sets - 10 reps - 5 hold  - Supine Bilateral Isometric Hip Abduction With Mobilization Belt  - 1 x daily - 7 x weekly - 3 sets - 10 reps      Subjective:     History of Present Condition     Date of onset:  08/23/2016    Date is approximation?: yes    Explanation:  Patient reports onset of symptoms in 2018.    History of Present Condition/Chief Complaint:  Mr. Mukai is a 43 y.o. right hand dominant adult (male to male transition in process) who presents to outpatient physical therapy with chronic low back pressure, tightness and pain that began in 2018.  Patient cannot remember any specific traumatic mechanism of injury that may have been the result of his symptoms, but he does report that he broke his right leg 2x when he was younger.  The first one was when he was a baby.  The second time he broke it falling off the couch when he was 8.  He can't remember if it was his femur or tibia and he hasn't had a recent x-ray.  Subjective:  Mr. Muzquiz reports that he felt good for about 1 week after last appointment.  He got sick again and the doctor's are testing him for his immune system to see if anything is going on.  He is doing his exercises consistently.  Pain:     Current pain rating:  6  Location:  Right low back and right neck    Progression:  Worsening      Objective:       Palpation   Left Hip   Hypertonic in the gluteus medius.  Tenderness of the gluteus medius.    Right Lumbar   Hypertonic in the erector spinae, lumbar paraspinals and quadratus lumborum.    Right Shoulder   Hypertonic in the upper trapezius.   Tenderness of the upper trapezius.     Right Cervical/Thoracic   Hypertonic in the scalenes, sternocleidomastoid and upper trapezius.   Tenderness of the scalenes, sternocleidomastoid and upper trapezius.     Joint Mobility  Lumbar  Lumbar Joint Mobility   Hypomobile at L4 and L5.   Pain at L4 and L5.                                   I attest that I have reviewed the above information.  Signed: Willistine Hartigan, PT  11/24/2023 8:58 AM

## 2023-11-27 MED FILL — DOXYCYCLINE HYCLATE 100 MG TABLET: ORAL | 20 days supply | Qty: 40 | Fill #0

## 2023-11-27 MED FILL — CLONAZEPAM 0.5 MG TABLET: ORAL | 30 days supply | Qty: 30 | Fill #3

## 2023-11-28 ENCOUNTER — Ambulatory Visit
Admit: 2023-11-28 | Discharge: 2023-11-29 | Payer: BLUE CROSS/BLUE SHIELD | Attending: Student in an Organized Health Care Education/Training Program | Primary: Student in an Organized Health Care Education/Training Program

## 2023-11-28 DIAGNOSIS — M5432 Sciatica, left side: Principal | ICD-10-CM

## 2023-11-28 DIAGNOSIS — R634 Abnormal weight loss: Principal | ICD-10-CM

## 2023-11-28 LAB — HEMOGLOBIN A1C
ESTIMATED AVERAGE GLUCOSE: 108 mg/dL
HEMOGLOBIN A1C: 5.4 % (ref 4.8–5.6)

## 2023-11-28 LAB — CBC
HEMATOCRIT: 43.6 % (ref 36.0–46.0)
HEMOGLOBIN: 14.4 g/dL (ref 12.1–15.7)
MEAN CORPUSCULAR HEMOGLOBIN CONC: 33.1 g/dL (ref 32.0–36.0)
MEAN CORPUSCULAR HEMOGLOBIN: 26.3 pg (ref 25.9–32.4)
MEAN CORPUSCULAR VOLUME: 79.5 fL (ref 77.6–95.7)
MEAN PLATELET VOLUME: 7.5 fL (ref 6.8–10.7)
PLATELET COUNT: 318 10*9/L (ref 150–450)
RED BLOOD CELL COUNT: 5.48 10*12/L — ABNORMAL HIGH (ref 4.11–5.36)
RED CELL DISTRIBUTION WIDTH: 14.7 % (ref 12.2–15.2)
WBC ADJUSTED: 4.9 10*9/L (ref 3.6–11.2)

## 2023-11-28 LAB — THYROID FUNCTION CASCADE: THYROID STIMULATING HORMONE: 0.764 u[IU]/mL (ref 0.550–4.780)

## 2023-11-28 MED ADMIN — ketorolac (TORADOL) injection 30 mg: 30 mg | INTRAMUSCULAR | @ 16:00:00 | Stop: 2023-11-28

## 2023-11-28 NOTE — Unmapped (Signed)
 Hi Rosmary,    It was great to see you today. I look forward to seeing you at your next visit. In the meantime, please feel free to message me on mychart with any non-time-sensitive questions or requests. For any time-sensitive matters (including completion of medical clearance, school/work physical or other forms), please call [phone] to request a phone, video, or in-person visit with me or one of my colleagues.     Additionally, [facility] Medicine has an Urgent Care at our [facility] location! Call [phone] for same-day appointments, available Monday-Friday 7am-8pm; Saturday and Sunday 12pm-5pm. If you think you are having an emergency, please call 911 or go to your nearest emergency department.     If you ever need urgent help with your mental health, including for any thoughts of self-harm or suicide, please call the [place] mental health crisis line at [phone], or the national suicide prevention hotline at 713-525-3688.     Take care,    Valdene Garret, MD, MPH (he/him)  [facility] at [facility]  [address]   [place], [place] [address]  Phone: [phone]  Fax: [phone]

## 2023-11-28 NOTE — Unmapped (Signed)
 [facility] at [facility]  Established Patient Clinic Note    Assessment/Plan:   Vincent Waters is a 43 y.o.adult who presents for follow-up.    Problem List Items Addressed This Visit    None  Visit Diagnoses         Back pain with left-sided sciatica    -  Primary    Relevant Medications    ketorolac (TORADOL) injection 30 mg (Completed)      Unintended weight loss        Relevant Orders    CBC    Thyroid Function Cascade    Hemoglobin A1c          - Current meds: Current Medications[1]  Assessment & Plan  # Left-sided sciatica - L greater trochanteric bursitis  Severe left hip pain radiating to the ankle with tingling in foot, straight leg test positive (reproduces tingling and pain in foot), consistent with sciatica. No saddle anesthesia, LE weakness, incontinence, or other red flag symptoms. Does endorse intermittent swelling of L ankle and distal shin, however on my exam both his calf and shin circumferences are slightly lower on left side (37.5cm calf, 24cm shin) compared to right (38cm calf, 25cm shin). Gabapentin provides partial relief; unable to tolerate PO NSAIDs. Discussed neuropathic pain management with specialists. Prefers to avoid prednisone taper. After discussion of eval/management options, pt would like to proceed with toradol 30 mg IM today for immediate relief, and agrees with my contacting his neurologist and pain management specialist to discuss longer-term options.   - Administer Toradol injection for immediate pain relief.  - Coordinate with pain clinic and neurologist for neuropathic pain management adjustments.  - Continue physical therapy    # Unintended weight loss - history of low TSH  Unintended weight loss with stable appetite and decreased activity. Weight stable since March, significant loss from August to December 2024. Discussed potential causes including thyroid dysfunction and fat redistribution due to testosterone therapy.  - Order CBC to evaluate potential underlying causes of weight loss.  - Order TSH to assess thyroid function.  - Order A1c to screen for diabetes.    Wt Readings from Last 25 Encounters:   11/28/23 68.9 kg (152 lb)   11/17/23 68.9 kg (152 lb)   11/02/23 69.8 kg (153 lb 12.8 oz)   10/19/23 69.4 kg (153 lb)   10/19/23 69.8 kg (153 lb 12.8 oz)   10/17/23 69.4 kg (153 lb)   09/06/23 69.4 kg (153 lb)   08/31/23 71.2 kg (157 lb)   08/01/23 71.2 kg (157 lb)   07/26/23 71.2 kg (157 lb)   07/25/23 71.2 kg (157 lb)   07/19/23 70 kg (154 lb 6.4 oz)   06/27/23 70.9 kg (156 lb 3.2 oz)   06/22/23 70.8 kg (156 lb)   06/19/23 72.4 kg (159 lb 9.6 oz)   06/16/23 71 kg (156 lb 9.6 oz)   05/31/23 71 kg (156 lb 9.6 oz)   05/26/23 72 kg (158 lb 11.2 oz)   04/28/23 72.5 kg (159 lb 12.8 oz)   04/03/23 74.2 kg (163 lb 8 oz)   03/23/23 74.9 kg (165 lb 3.2 oz)   03/10/23 73.9 kg (163 lb)   02/28/23 75.9 kg (167 lb 6.4 oz)   02/22/23 75.2 kg (165 lb 12.8 oz)   02/13/23 75.7 kg (166 lb 12.8 oz)       HEALTH MAINTENANCE ITEMS STILL DUE:  There are no preventive care reminders to display for this patient.  Follow-up: No follow-ups on file.    Future Appointments   Date Time Provider Department Center   11/30/2023  8:40 AM Palmira Bock, MD CARDSTHPOINT [facility]   12/06/2023 12:15 PM Wilmon Hashimoto, Rumalda Counter, PT PTOT [facility]   12/11/2023 12:15 PM Charletta Cons, PT PTOT [facility]   12/12/2023  1:30 PM [facility]O RM 8 [facility] [place]   12/12/2023  2:30 PM [facility] MRI RM 6 [facility]   12/28/2023 12:00 PM Leonor Ramsay, MD UNCGIMEDET [facility]   01/01/2024  8:00 AM Caid, Eusebio High, MD UNCALLERGET [facility]   01/01/2024  9:20 AM MEDDDN MOTILITY NURSE UNCGIMEDET [facility]   01/03/2024  1:30 PM Dorsey Gault [id number] [facility]   01/04/2024  8:45 AM Kimple, Adam [place], MD UNCOTOMEWVIL [facility]   01/05/2024  8:00 AM Lobonc, Jerlene Moody, MD ANESPAINMRKT [facility]   02/01/2024  9:00 AM Andrea Banda, PMHNP OPTCVilcom [facility]   02/12/2024 12:35 PM Ernestene Headings, FNP [facility]   02/20/2024 10:45 AM Edie Goon, FNP [facility]   02/29/2024 10:30 AM Abbey Hobby, MD HVVSURMMNT [facility]       Subjective   Jarrad is a 43 y.o. adult  coming to clinic today for follow-up.    Chief Complaint   Patient presents with    Ankle Pain     Left ankle swollen and pain. Tried ibuprofen but can't tolerate well d/t stomach discomfort.     Tailbone Pain     Left buttocks - has a pinched nerve (PT massaged the area this past Friday and it has been painful since and radiating down his leg). Pt didn't notice any issue until left ankle started swelling on Sunday.      History of Present Illness  Pasco Danon Lograsso is a 43 year old male with left hip pain and sciatica who presents with worsening left hip pain and radiating leg pain.    He has experienced a return of left hip pain, which is more severe than previous episodes, describing it as 'on twenty this time.' The pain is primarily located at the trochanter and radiates down the buttock, groin, leg, and reaches the ankle, where he experiences tingling sensations. The pain worsens when sitting, and he has not experienced any falls or injuries recently. He attended physical therapy on Friday where dry needling and massage were performed, but the massage was particularly painful. He has a history of a pinched nerve in the buttock, which he believes may be contributing to his symptoms. He has previously been diagnosed with left-sided sciatica and lower back pain.    He has been using ibuprofen 800 mg, but it causes stomach discomfort so has been trying to avoid this. He has tried Tylenol without relief. He is currently taking gabapentin 600 mg three times a day, which provides some relief when effective. He has a history of hip labral tear and has been referred to physical therapy and pain management in the past.    He reports unintended weight loss despite eating well, noting a decrease from 167 pounds last year to 152 pounds currently. He has been stable at 152 pounds since March. He is concerned about the weight loss and its implications, although his activity levels have decreased.    No issues with urination or bowel movements. No lack of sensation in the groin or buttocks. Reports tingling in the ankle and throbbing pain in the foot.    I  have reviewed the problem list, medications, and allergies and have updated/reconciled them if needed.    Destine  reports that he has never smoked. He has never been exposed to tobacco smoke. He has never used smokeless tobacco.  Health Maintenance   Topic Date Due    DTaP/Tdap/Td Vaccines (2 - Td or Tdap) 08/19/2027    Lipid Screening  11/01/2028    Pneumococcal Vaccine 0-49  Completed    Hepatitis C Screen  Completed    COVID-19 Vaccine  Completed    Influenza Vaccine  Completed       Objective     VITALS: BP 129/89 (BP Site: L Arm, BP Position: Sitting, BP Cuff Size: Medium)  - Pulse 77  - Temp 37.1 ??C (98.7 ??F) (Temporal)  - Ht 162.6 cm (5' 4.02)  - Wt 68.9 kg (152 lb)  - LMP 07/20/2017 (Exact Date)  - BMI 26.08 kg/m??     Physical Exam  General: well-appearing, sitting upright in no acute distress  Head: Normocephalic, atraumatic  ENT: No dental trauma noted.   Eyes: conjunctiva normal, non-erythematous, non-icteric, no discharge.  Neck: no thyroid enlargement or masses  Lungs: No increased work of breathing or audible wheezing  Ext: Marked TTP of L greater trochanter and buttock, with positive left straight leg test.   Skin: Warm, dry, no erythema or rash on exposed skin  Musculoskeletal: No visible gait abnormalities  Neurologic: Alert & oriented x 3, no gross sensorimotor abnormalities  Psychiatric: Pleasant, cooperative, good eye contact, appropriate thought processes    Wt Readings from Last 3 Encounters:   11/28/23 68.9 kg (152 lb)   11/17/23 68.9 kg (152 lb)   11/02/23 69.8 kg (153 lb 12.8 oz)      PHQ-9 PHQ-9 Total Score   10/22/2020   9:00 AM 6 09/04/2020   9:00 AM 3    08/21/2020   9:00 AM 5    05/22/2020   3:00 PM 9        Data saved with a previous flowsheet row definition     PHQ-2 Score: 0    Screening complete, no depression identified / no further action needed today    LABS/IMAGING: I have reviewed pertinent recent labs and imaging in Epic.    I personally spent a total of 40 minutes taking care of this patient including time with the patient, pre-visit planning, documentation, reviewing labs, coordinating care, and reviewing documentation from other providers in [facility] and in other specialties.  This was all done on the day of the visit.  My (and this clinic's) longitudinal relationship with this patient was instrumental in caring for this patient today including caring for their chronic medical and psychiatric conditions.      Jenny Mohs, MD, MPH (he/him)  [facility] at [facility]  [address]   [place], [place] [address]  Phone: [phone]  Fax: [phone]       [1]   Current Outpatient Medications:     albuterol 2.5 mg /3 mL (0.083 %) nebulizer solution, Inhale 3 mL (2.5 mg total) by nebulization every six (6) hours as needed for wheezing or shortness of breath., Disp: 90 mL, Rfl: 1    azelastine (ASTELIN) 137 mcg (0.1 %) nasal spray, Instill 2 sprays into each nostril two (2) times a day as needed for rhinitis., Disp: 30 mL, Rfl: 1    budesonide-formoterol (SYMBICORT) 160-4.5 mcg/actuation inhaler, Inhale 1 puff by mouth Two (2) times a day., Disp: 10.2 g,  Rfl: 5    cetirizine (ZYRTEC) 10 MG tablet, Take 1 tablet (10 mg total) by mouth daily., Disp: 30 tablet, Rfl: 12    clonazePAM (KLONOPIN) 0.5 MG tablet, Take 0.5 tablets (0.25 mg total) by mouth two (2) times a day as needed for anxiety., Disp: 30 tablet, Rfl: 5    dextroamphetamine-amphetamine (ADDERALL) 20 mg tablet, Take 1 tablet (20 mg total) by mouth two (2) times a day., Disp: 60 tablet, Rfl: 0    [START ON 12/09/2023] dextroamphetamine-amphetamine (ADDERALL) 20 mg tablet, Take 1 tablet (20 mg total) by mouth two (2) times a day., Disp: 60 tablet, Rfl: 0    [START ON 01/08/2024] dextroamphetamine-amphetamine (ADDERALL) 20 mg tablet, Take 1 tablet (20 mg total) by mouth two (2) times a day., Disp: 60 tablet, Rfl: 0    diclofenac sodium (VOLTAREN) 1 % gel, Apply 2 g topically four (4) times a day., Disp: 100 g, Rfl: 0    doxycycline (VIBRA-TABS) 100 MG tablet, Take 200 mg (2 tablets) within 24-72 hours of any condomless sexual encounter., Disp: 40 tablet, Rfl: 0    emtricitabine-tenofovir alafen (DESCOVY) 200-25 mg tablet, Take 1 tablet by mouth daily., Disp: 90 tablet, Rfl: 1    EPINEPHrine (EPIPEN) 0.3 mg/0.3 mL injection, Inject 0.3 mL (0.3 mg total) under the skin once for 1 dose., Disp: 2 each, Rfl: 0    gabapentin (NEURONTIN) 250 mg/5 mL oral solution, Take 12 mL (600 mg total) by mouth Three (3) times a day., Disp: 1080 mL, Rfl: 2    inhalational spacing device Spcr, Use as directed with albuterol and symbicort, Disp: 1 each, Rfl: 1    ipratropium (ATROVENT) 42 mcg (0.06 %) nasal spray, 1-2 sprays in each nostril as needed for drainage, up to 4 time daily., Disp: 15 mL, Rfl: 2    metoclopramide (REGLAN) 10 MG tablet, Take 1/2 tablet (5 mg total) by mouth Three (3) times a day with a meal., Disp: 135 tablet, Rfl: 3    nebulizer and compressor (COMP-AIR NEBULIZER COMPRESSOR) Devi, 1 each by Miscellaneous route nightly as needed., Disp: 1 each, Rfl: 0    needle, disp, 25 Neno (BD REGULAR BEVEL NEEDLES) 25 Meagan x 5/8 Ndle, For subcutaneous hormone injection., Disp: 25 each, Rfl: 0    omalizumab (XOLAIR) 300 mg/2 mL auto-injector, Inject the contents of 1 pen (300 mg) under the skin every fourteen (14) days., Disp: 4 mL, Rfl: 11    omalizumab 75 mg/0.5 mL AtIn, Inject the contents of 1 pen (75 mg) under the skin every fourteen (14) days., Disp: 1 mL, Rfl: 11    pantoprazole (PROTONIX) 40 MG tablet, Take 1 tablet (40 mg total) by mouth daily., Disp: 90 tablet, Rfl: 3    prucalopride (MOTEGRITY) 2 mg Tab, Take 1 tablet (2 mg total) by mouth daily., Disp: 90 tablet, Rfl: 3    safety needles (BD ECLIPSE) 25 Idris x 1 Ndle, use for testosterone, Disp: 12 each, Rfl: 0    safety needles (BD SAFETYGLIDE NEEDLE) 18 Harrell x 1 1/2 Ndle, For drawing hormone injection, Disp: 25 each, Rfl: 0    syringe, disposable, (EASY TOUCH LUER LOCK SYRINGE) 1 mL Syrg, Use for weekly hormone injection., Disp: 4 each, Rfl: 0    syringe, disposable, (EASY TOUCH LUER LOCK SYRINGE) 1 mL Syrg, Use for testosterone, Disp: 12 each, Rfl: 0    syringe, disposable, 2.5 mL Syrg, Use weekly, Disp: 30 Syringe, Rfl: 2    testosterone cypionate (DEPOTESTOTERONE CYPIONATE) 200 mg/mL  injection, Inject 0.2 mL (40 mg total) into the muscle once a week., Disp: 12 mL, Rfl: 0    triamcinolone (KENALOG) 0.1 % cream, Apply topically Two (2) times a day., Disp: 15 g, Rfl: 0    verapamil (CALAN) 40 MG tablet, Take 1 tablet (40 mg total) by mouth nightly for 14 days, THEN 1 tablet (40 mg total) two (2) times a day for 28 days., Disp: 60 tablet, Rfl: 2    Current Facility-Administered Medications:     omalizumab (XOLAIR) injection 300 mg, 300 mg, Subcutaneous, Q28 Days, [name], [name], [name], 300 mg at 05/24/22 1138

## 2023-11-28 NOTE — Unmapped (Signed)
 [facility] THERAPY SERVICES AT [place]  OUTPATIENT PHYSICAL THERAPY  11/28/2023  Note Type: Treatment Note       Patient Name: Vincent Waters  Date of Birth:1980/11/22  Diagnosis:   Encounter Diagnosis   Name Primary?    Chronic right-sided low back pain without sciatica Yes     Referring MD:  Reeves Canter,*     Date of Onset of Impairment-08/23/2016  Date PT Care Plan Established or Reviewed-08/24/2023  Date PT Treatment Started-08/24/2023   Visit Count: 12  Plan of Care Effective Date:          Assessment/Plan:    Assessment  Assessment details:    It appears that Vincent Waters has a left hip external rotator spasm.  Following TDN, STM and manual therapy, he reported that it felt significantly better.  I suspect that it occurred from increased driving, stopping using a pad he was sitting on while driving and persistent hip external rotator strengthening.  Recommended he: 1) Increased hydration for next few days; 2) Stop clam shell, fire hydrant and supine isometric ER with belt for a few days; 3) Perform hip external rotator stretches; 4) Start using pad while driving again; and 5) Start hip ER strengthening with green band again when he starts to feel better.  He agreed to follow these recommendations.  All questions answered.  This patient requires skilled physical therapy services to address the outlined impairments in order to return to his desired level of function.       Impairments: pain, muscular restrictions, hypertonicity, core weakness, impaired ADLs, joint restriction, decreased strength and gait deviation                      Therapy Goals      Goals:      Goals:  In 12 weeks:  Goal #1: Patient will be independent with progressively monitored home exercise program.   Baseline/Current: Patient is unfamiliar with home exercise program.     Goal #2: Patient will demonstrate lumbar ROM within normal limits without pain in order to perform daily functional activities such as bending to pick an object off the ground, twisting to reach into the patient's backseat or bend backwards in order to lift an object overhead.   Baseline/Current: Patient has painful lumbar ROM (see ROM testing).     Goal #3: Patient will demonstrate right hip strength within normal limits without pain in order to allow for functional tasks such as symmetrical walking, squatting and negotiating stairs.   Baseline/Current: Patient has limited right hip strength (see manual muscle testing).      Goal #4: Patient will be able to lift at least 75 lb from floor to waist with good lifting mechanics in order to demonstrate good core and trunk strength.   Baseline/Current: Patient has pain and difficulty lifting at present.    Goal #5: Patient will report improvement in symptoms while sitting and ability to sit up to 1 hour with minimal symptoms.  Baseline/Current: Patient has difficulty sitting at present.    Goal #6: Patient's FOTO score will improve from 34 at initial evaluation to >/= 53 at discharge in order to demonstrate a significant improvement in self-reported outcome measures that directly result from his course of physical therapy.   Baseline/Current: FOTO = 34    Plan    Therapy options: will be seen for skilled physical therapy services    Planned therapy interventions: 20560, 20561-Dry Needling 1-2, 3+ areas, 97010-Cold Packs/Hot  Packs, 97012-Mechanical Traction, [id number], G0283-Electrical Stimulation (unattended, attended), 97110-Therapeutic Exercises, 97112-Neuromuscular Re-education, 97116-Gait Training, 97140-Manual Therapy, 97164-Re-evaluation (PT) and 97530-Therapeutic Activities      Frequency: 1x week    Duration in weeks: 12    Education provided to: patient.    Education provided: Surveyor, minerals, HEP and Treatment options and plan    Education results: verbalized good understanding, needs reinforcement, demonstrates understanding and needs further instruction.    Communication/Consultation: N/A.    Next visit plan:        Manual therapy/TDN to calm lumbar musculature, right hip/core/trunk strengthening.    Total Session Time: 35    Treatment rendered today:      Therapeutic Exercise: 5 minutes  - Patient Education: Plan of Care, Home Exercise Program  - Supine Figure 4 Stretch: 10 x 10  - Supine LE Nerve Mobilization: 2 x 10    Manual Therapy: 30 minutes  - Soft Tissue Mobilization left hip external rotators  - Left hip External Rotator PIR MET  - PROM Left Hip ER  - Supine LE Nerve Mobilization  [facility]  was informed of the risks associated with dry needling and he signed consent form.  Patient screened for any contraindications to needling including pregnancy, infection, antibiotic use, anticoagulants use, autoimmune issues, uncontrolled diabetes, and active cancer/treatment and does not have any of these conditions.  DRY NEEDLING FOR RELEASING IDENTIFIED TRIGGER POINTS TO FACILITATE MANUAL THERAPY TECHNIQUES AND ALLOW MUSCLE TO RETURN TO OPTIMAL LENGTH (NO CHARGE).  Right upper trapezius trigger point with 50mm x .30mm with Pinch/Pull Technique established for safety - Local Twitch Response produced     Left piriformis medius trigger point with 50mm x .30mm with Direct Approach established for safety - Local Twitch Response produced     Plan details: Access Code: [email or web address]  URL: [email or web address]  Date: 11/28/2023  Prepared by: Irineo Manns    Exercises  - Supine Posterior Pelvic Tilt  - 1 x daily - 7 x weekly - 1 sets - 20 reps - 5 hold  - Sidelying Thoracolumbar Mobilisation with Towel Roll  - 2 x daily - 7 x weekly - 1 sets - 1 reps - 5 minutes hold  - Clamshell with Resistance (Mirrored)  - 1 x daily - 7 x weekly - 2 sets - 10 reps - 3 hold  - Sidelying Hip Abduction (Mirrored)  - 1 x daily - 7 x weekly - 2 sets - 10 reps  - Hooklying Transversus Abdominis Palpation  - 1 x daily - 7 x weekly - 1 sets - 15 reps - 5 hold  - Supine March  - 1 x daily - 7 x weekly - 2 sets - 10 reps  - Supine 90/90 Alternating Toe Touch  - 1 x daily - 7 x weekly - 2 sets - 10 reps  - Full Plank  - 1 x daily - 7 x weekly - 1 sets - 3 reps - 30-45 hold  - Supine Bridge with Leg Extension  - 1 x daily - 7 x weekly - 2 sets - 10 reps - 5 hold  - Bird Dog  - 1 x daily - 7 x weekly - 2 sets - 10 reps  - Supine Dead Bug with Leg Extension  - 1 x daily - 7 x weekly - 2 sets - 10 reps  - Quadruped Hip Abduction with Resistance Loop  - 1 x daily - 7 x weekly - 2 sets - 10 reps  -  Sternocleidomastoid Stretch  - 1 x daily - 7 x weekly - 1 sets - 3 reps - 30 hold  - Supine Piriformis Stretch with Foot on Ground  - 1 x daily - 7 x weekly - 3 sets - 10 reps  - Supine Bilateral Isometric ER With Mobilization Belt  - 1 x daily - 7 x weekly - 15 sets - 10 reps - 5 hold  - Supine Bilateral Isometric Hip Abduction With Mobilization Belt  - 1 x daily - 7 x weekly - 3 sets - 10 reps  - Supine Figure 4 Piriformis Stretch  - 1 x daily - 7 x weekly - 1 sets - 10 reps - 10 hold  - Supine LE Neural Mobilization  - 1 x daily - 7 x weekly - 2 sets - 10 reps      Subjective:     History of Present Condition     Date of onset:  08/23/2016    Date is approximation?: yes    Explanation:  Patient reports onset of symptoms in 2018.    History of Present Condition/Chief Complaint:  Vincent Waters is a 43 y.o. right hand dominant adult (male to male transition in process) who presents to outpatient physical therapy with chronic low back pressure, tightness and pain that began in 2018.  Patient cannot remember any specific traumatic mechanism of injury that may have been the result of his symptoms, but he does report that he broke his right leg 2x when he was younger.  The first one was when he was a baby.  The second time he broke it falling off the couch when he was 8.  He can't remember if it was his femur or tibia and he hasn't had a recent x-ray.  Subjective:  Vincent Waters reports that he felt a little better after last appointment.  He had a sudden onset of sciatica in his left side in his left lower extremity, along with left ankle swelling.  He just had a Tordol.  He is doing his exercises consistently.  Pain:     Current pain rating:  5  Location:  Left posterior hip    Quality:  Radiating, tingling and numb    Progression:  Worsening      Objective:       Palpation   Left Hip   Hypertonic in the piriformis.  Muscle spasm in the piriformis.  Tenderness of the piriformis.  Trigger point to piriformis.                                  I attest that I have reviewed the above information.  Signed: Willistine Waters, PT  11/28/2023 12:20 PM

## 2023-12-05 DIAGNOSIS — M7918 Myalgia, other site: Principal | ICD-10-CM

## 2023-12-05 DIAGNOSIS — R3915 Urgency of urination: Principal | ICD-10-CM

## 2023-12-05 DIAGNOSIS — N3001 Acute cystitis with hematuria: Principal | ICD-10-CM

## 2023-12-05 MED ORDER — CYCLOBENZAPRINE 5 MG TABLET
ORAL_TABLET | Freq: Three times a day (TID) | ORAL | 1 refills | 5.00000 days | Status: CP | PRN
Start: 2023-12-05 — End: ?
  Filled 2023-12-05: qty 15, 5d supply, fill #0

## 2023-12-05 MED ORDER — NITROFURANTOIN MONOHYDRATE/MACROCRYSTALS 100 MG CAPSULE
ORAL_CAPSULE | Freq: Two times a day (BID) | ORAL | 0 refills | 7.00000 days | Status: CP
Start: 2023-12-05 — End: 2023-12-12
  Filled 2023-12-05: qty 14, 7d supply, fill #0

## 2023-12-05 MED ADMIN — ketorolac (TORADOL) injection 30 mg: 30 mg | INTRAMUSCULAR | @ 14:00:00 | Stop: 2023-12-05

## 2023-12-05 NOTE — Unmapped (Signed)
 [facility] at [facility]  Established Patient Clinic Note    Assessment/Plan:   Vincent Waters is a 43 y.o.adult who presents for follow-up.    Problem List Items Addressed This Visit    None  Visit Diagnoses         Urinary urgency    -  Primary    Relevant Medications    nitrofurantoin, macrocrystal-monohydrate, (MACROBID) 100 MG capsule    Other Relevant Orders    POCT urinalysis dipstick    Urinalysis with Microscopy with Culture Reflex      Gluteal pain        Relevant Medications    ketorolac (TORADOL) injection 30 mg (Completed) (Start on 12/05/2023 11:00 AM)    cyclobenzaprine (FLEXERIL) 5 MG tablet      Acute cystitis with hematuria        Relevant Medications    nitrofurantoin, macrocrystal-monohydrate, (MACROBID) 100 MG capsule    Other Relevant Orders    POCT urinalysis dipstick    Urinalysis with Microscopy with Culture Reflex          - Current meds: Current Medications[1]  Assessment & Plan  # Left-sided gluteal pain with sciatica  Worsening left-sided gluteal pan and sciatica with pain radiating from buttock to ankle. No true CVA tenderness so low suspicion this is related to UTI (see below). Follows weekly with physical therapist. Given good but not sustained relief with toradol injection last week with no history of renal dysfunction, discussed risks/benefits of repeat toradol injection which he elects to proceed with, in addition to rx for low dose flexeril which he has tolerated well in the past. Agrees with my reaching back out to his prior pain specialist to re-establish care.   - Continue physical therapy including massage and dry needling.  - I will contact pain management to discuss re-establishing care  - ketorolac (TORADOL) injection 30 mg  - cyclobenzaprine (FLEXERIL) 5 MG tablet; Take 1 tablet (5 mg total) by mouth Three (3) times a day as needed for muscle spasms.  Dispense: 15 tablet; Refill: 1    # Urinary urgency  New onset urinary urgency with frequent urges and small output. Mild irritation but no incontinence or discharge. Discussed differential including UTI, yeast/BV, STI, interstitial cystitis, less likely neurogenic bladder. After discussion of eval/management options, pt agreed to proceed with POCT UA which demonstrated 1+ LE and 1+ blood, c/w UTI. After discussion of risks and benefits, pt agrees with formal UA w/ reflex culture, and in the meantime starting macrobid for presumed UTI tx. Will plan to follow up in one week if symptoms not resolved by that time, or sooner as needed . Pt has no further questions or concerns.   - POCT UA w/ 1+ LE, 1+ blood  - Formal UA + reflex culture sent  - nitrofurantoin, macrocrystal-monohydrate, (MACROBID) 100 MG capsule; Take 1 capsule (100 mg total) by mouth two (2) times a day for 7 days.  Dispense: 14 capsule; Refill: 0    HEALTH MAINTENANCE ITEMS STILL DUE:  There are no preventive care reminders to display for this patient.  Follow-up: No follow-ups on file.    Future Appointments   Date Time Provider Department Center   12/06/2023 12:15 PM Santo Karena Pac, PT PTOT [facility]   12/11/2023 12:15 PM Santo Karena Pac, PT PTOT [facility]   12/12/2023  1:30 PM [facility] FLUORO RM 8 IFLUOUW [place]   12/12/2023  2:30 PM [facility] MRI RM 6 [facility] [place]  12/28/2023 12:00 PM Sonna Gee, MD UNCGIMEDET [facility]   01/01/2024  8:00 AM Caid, Comer PARAS, MD UNCALLERGET [facility]   01/01/2024  9:20 AM MEDDDN MOTILITY NURSE UNCGIMEDET [facility]   01/03/2024  1:30 PM Melvenia Child UNCSODGP3FL [facility]   01/04/2024  8:45 AM Kimple, Adam [place], MD UNCOTOMEWVIL [facility]   01/05/2024  8:00 AM Lobonc, Prentice Cadet, MD ANESPAINMRKT [facility]   01/16/2024  8:00 AM Caro Concha Bruckner, MD CARDSTHPOINT [facility]   02/01/2024  9:00 AM Arne Lavanda Bang, PMHNP OPTCVilcom [facility]   02/12/2024 12:35 PM Argentina Geofm CROME, FNP [facility]   02/20/2024 10:45 AM Lia Ee, FNP NICOLAS [facility]   02/29/2024 10:30 AM Ave Bosworth, MD HVVSURMMNT [facility]       Subjective   Vincent Waters is a 43 y.o. adult  coming to clinic today for follow-up.    No chief complaint on file.    History of Present Illness  Vincent Waters is a 43 year old male who presents for follow-up.     He has persistent pain in the left hip and buttock area, radiating from the buttock to the ankle. The pain has been worsening over time. Previous treatments included massage and dry needling by a physical therapist, which temporarily alleviated the symptoms. An injection also provided relief, but the pain has since returned with increased intensity.    He experiences urinary urgency, characterized by a frequent need to urinate with a sensation of bladder fullness, even when little urine is produced. This symptom is exacerbated when sitting or lying down and has been present since last Thursday. No urinary incontinence, or discharge. This issue has occurred in the past, resolving spontaneously before recurring.    Additional symptoms include swelling in the left ankle, neck pain, tingling in the fingers, and tremors. He associates these symptoms with a possible flare-up, which he experiences annually.    I have reviewed the problem list, medications, and allergies and have updated/reconciled them if needed.    Vincent Waters  reports that he has never smoked. He has never been exposed to tobacco smoke. He has never used smokeless tobacco.  Health Maintenance   Topic Date Due    DTaP/Tdap/Td Vaccines (2 - Td or Tdap) 08/19/2027    Lipid Screening  11/01/2028    Pneumococcal Vaccine 0-49  Completed    Hepatitis C Screen  Completed    COVID-19 Vaccine  Completed    Influenza Vaccine  Completed       Objective     VITALS: BP 131/80 (BP Site: L Arm, BP Position: Sitting, BP Cuff Size: Medium)  - Pulse 83  - Temp 37.1 ??C (98.7 ??F) (Temporal)  - Ht 162.6 cm (5' 4.02)  - Wt 68.9 kg (152 lb) Comment: pt reported - LMP 07/20/2017 (Exact Date)  - BMI 26.08 kg/m?? Physical Exam  General: uncomfortable but nontoxic-appearing, sitting upright in no acute distress  Head: Normocephalic, atraumatic  ENT: No dental trauma noted.   Eyes: conjunctiva normal, non-erythematous, non-icteric, no discharge.  Neck: no thyroid enlargement or masses  Lungs: No increased work of breathing or audible wheezing  Skin: Warm, dry, no erythema or rash on exposed skin  Musculoskeletal: Walking with slight limp favoring right side. Diffuse TTP of L gluteal muscle. Straight leg positive.  Neurologic: Alert & oriented x 3, no gross sensorimotor abnormalities  Psychiatric: Pleasant, cooperative, good eye contact, appropriate thought processes    Wt Readings from Last 3  Encounters:   12/05/23 68.9 kg (152 lb)   11/28/23 68.9 kg (152 lb)   11/17/23 68.9 kg (152 lb)      PHQ-9 PHQ-9 Total Score   10/22/2020   9:00 AM 6    09/04/2020   9:00 AM 3    08/21/2020   9:00 AM 5    05/22/2020   3:00 PM 9        Data saved with a previous flowsheet row definition     LABS/IMAGING: I have reviewed pertinent recent labs and imaging in Epic.    I personally spent a total of 30 minutes taking care of this patient including time with the patient, pre-visit planning, documentation, reviewing labs, coordinating care, and reviewing documentation from other providers in [facility] and in other specialties.  This was all done on the day of the visit.  My (and this clinic's) longitudinal relationship with this patient was instrumental in caring for this patient today including caring for their chronic medical and psychiatric conditions.      Odis Aho, MD, MPH (he/him)  [facility] at [facility]  [address]   [place], [place] [address]  Phone: [phone]  Fax: [phone]       [1]   Current Outpatient Medications:     albuterol 2.5 mg /3 mL (0.083 %) nebulizer solution, Inhale 3 mL (2.5 mg total) by nebulization every six (6) hours as needed for wheezing or shortness of breath., Disp: 90 mL, Rfl: 1 azelastine (ASTELIN) 137 mcg (0.1 %) nasal spray, Instill 2 sprays into each nostril two (2) times a day as needed for rhinitis., Disp: 30 mL, Rfl: 1    budesonide-formoterol (SYMBICORT) 160-4.5 mcg/actuation inhaler, Inhale 1 puff by mouth Two (2) times a day., Disp: 10.2 g, Rfl: 5    cetirizine (ZYRTEC) 10 MG tablet, Take 1 tablet (10 mg total) by mouth daily., Disp: 30 tablet, Rfl: 12    clonazePAM (KLONOPIN) 0.5 MG tablet, Take 0.5 tablets (0.25 mg total) by mouth two (2) times a day as needed for anxiety., Disp: 30 tablet, Rfl: 5    cyclobenzaprine (FLEXERIL) 5 MG tablet, Take 1 tablet (5 mg total) by mouth Three (3) times a day as needed for muscle spasms., Disp: 15 tablet, Rfl: 1    dextroamphetamine-amphetamine (ADDERALL) 20 mg tablet, Take 1 tablet (20 mg total) by mouth two (2) times a day., Disp: 60 tablet, Rfl: 0    [START ON 12/09/2023] dextroamphetamine-amphetamine (ADDERALL) 20 mg tablet, Take 1 tablet (20 mg total) by mouth two (2) times a day., Disp: 60 tablet, Rfl: 0    [START ON 01/08/2024] dextroamphetamine-amphetamine (ADDERALL) 20 mg tablet, Take 1 tablet (20 mg total) by mouth two (2) times a day., Disp: 60 tablet, Rfl: 0    diclofenac sodium (VOLTAREN) 1 % gel, Apply 2 g topically four (4) times a day., Disp: 100 g, Rfl: 0    doxycycline (VIBRA-TABS) 100 MG tablet, Take 200 mg (2 tablets) within 24-72 hours of any condomless sexual encounter., Disp: 40 tablet, Rfl: 0    emtricitabine-tenofovir alafen (DESCOVY) 200-25 mg tablet, Take 1 tablet by mouth daily., Disp: 90 tablet, Rfl: 1    EPINEPHrine (EPIPEN) 0.3 mg/0.3 mL injection, Inject 0.3 mL (0.3 mg total) under the skin once for 1 dose., Disp: 2 each, Rfl: 0    gabapentin (NEURONTIN) 250 mg/5 mL oral solution, Take 12 mL (600 mg total) by mouth Three (3) times a day., Disp: 1080 mL, Rfl: 2  inhalational spacing device Spcr, Use as directed with albuterol and symbicort, Disp: 1 each, Rfl: 1    ipratropium (ATROVENT) 42 mcg (0.06 %) nasal spray, 1-2 sprays in each nostril as needed for drainage, up to 4 time daily., Disp: 15 mL, Rfl: 2    metoclopramide (REGLAN) 10 MG tablet, Take 1/2 tablet (5 mg total) by mouth Three (3) times a day with a meal., Disp: 135 tablet, Rfl: 3    nebulizer and compressor (COMP-AIR NEBULIZER COMPRESSOR) Devi, 1 each by Miscellaneous route nightly as needed., Disp: 1 each, Rfl: 0    needle, disp, 25 Kingstin (BD REGULAR BEVEL NEEDLES) 25 Earle x 5/8 Ndle, For subcutaneous hormone injection., Disp: 25 each, Rfl: 0    nitrofurantoin, macrocrystal-monohydrate, (MACROBID) 100 MG capsule, Take 1 capsule (100 mg total) by mouth two (2) times a day for 7 days., Disp: 14 capsule, Rfl: 0    omalizumab (XOLAIR) 300 mg/2 mL auto-injector, Inject the contents of 1 pen (300 mg) under the skin every fourteen (14) days., Disp: 4 mL, Rfl: 11    omalizumab 75 mg/0.5 mL AtIn, Inject the contents of 1 pen (75 mg) under the skin every fourteen (14) days., Disp: 1 mL, Rfl: 11    pantoprazole (PROTONIX) 40 MG tablet, Take 1 tablet (40 mg total) by mouth daily., Disp: 90 tablet, Rfl: 3    prucalopride (MOTEGRITY) 2 mg Tab, Take 1 tablet (2 mg total) by mouth daily., Disp: 90 tablet, Rfl: 3    safety needles (BD ECLIPSE) 25 Martha x 1 Ndle, use for testosterone, Disp: 12 each, Rfl: 0    safety needles (BD SAFETYGLIDE NEEDLE) 18 Keymarion x 1 1/2 Ndle, For drawing hormone injection, Disp: 25 each, Rfl: 0    syringe, disposable, (EASY TOUCH LUER LOCK SYRINGE) 1 mL Syrg, Use for weekly hormone injection., Disp: 4 each, Rfl: 0    syringe, disposable, (EASY TOUCH LUER LOCK SYRINGE) 1 mL Syrg, Use for testosterone, Disp: 12 each, Rfl: 0    syringe, disposable, 2.5 mL Syrg, Use weekly, Disp: 30 Syringe, Rfl: 2    testosterone cypionate (DEPOTESTOTERONE CYPIONATE) 200 mg/mL injection, Inject 0.2 mL (40 mg total) into the muscle once a week., Disp: 12 mL, Rfl: 0    triamcinolone (KENALOG) 0.1 % cream, Apply topically Two (2) times a day., Disp: 15 g, Rfl: 0 verapamil (CALAN) 40 MG tablet, Take 1 tablet (40 mg total) by mouth nightly for 14 days, THEN 1 tablet (40 mg total) two (2) times a day for 28 days., Disp: 60 tablet, Rfl: 2    Current Facility-Administered Medications:     omalizumab (XOLAIR) injection 300 mg, 300 mg, Subcutaneous, Q28 Days, [name], [name], [name], 300 mg at 05/24/22 1138

## 2023-12-05 NOTE — Unmapped (Signed)
 Hi Vincent Waters,    It was great to see you today. I look forward to seeing you at your next visit. In the meantime, please feel free to message me on mychart with any non-time-sensitive questions or requests. For any time-sensitive matters (including completion of medical clearance, school/work physical or other forms), please call [phone] to request a phone, video, or in-person visit with me or one of my colleagues.     Additionally, [facility] has an Urgent Care at our [facility] location! Call [phone] for same-day appointments, available Monday-Friday 7am-8pm; Saturday and Sunday 12pm-5pm. If you think you are having an emergency, please call 911 or go to your nearest emergency department.     If you ever need urgent help with your mental health, including for any thoughts of self-harm or suicide, please call the [place] mental health crisis line at [phone], or the national suicide prevention hotline at 713-525-3688.     Take care,    Valdene Garret, MD, MPH (he/him)  [facility] at [facility]  [address]   [place], [place] [address]  Phone: [phone]  Fax: [phone]

## 2023-12-06 NOTE — Unmapped (Signed)
 Department of Anesthesiology  [facility]  [address]  [place], [place] [address]  [phone]    Chronic Pain Follow-Up Note  1. Low back pain of over 3 months duration    2. Myofascial pain    3. TOS (thoracic outlet syndrome)    4. Chronic pain syndrome      Assessment and Plan  [facility] is a 43 y.o. being [facility] for evaluation of ongoing pain after TOS decompression/pectoralis minor release on the right for thoracic outlet syndrome by Dr. Ave in May 2023. He was first seen by our clinic in March 2023 for diagnostic injections. His range of motion has an increased postoperatively, but still has tight, spasming pain in the right neck and shoulder. He is currently weaning off benzodiazepines, discontinued opioid medications in late 2023 which he was chronically on for 3 years, and inquires about optimizing medications and procedures to improve his baseline pain at this time. His primary pain generator includes myofascial pain post-TOS decompression. He has other medical history that includes anxiety, scleroderma, migraines, autoimmune autonomic neuropathy, asthma. Also follows with sports medicine and has undergone injections there as well.  We have performed TPI and spinal accessory nerve block/pulsed RFA with benefit.     June 2025  The patient was last seen in April, at which point the patient presented for chronic pain follow up related to TOS and myofacial pain. He had right spinal accessory nerve pulsed RF and TPIs on 07/27/23, he endorsed no pain relief at all. He had significant relief with previous RF on 12/2022. He stated his shoulder was locked up for two weeks after the last injection, he felt RF and TPIS on the same days was too much for him. He had been working with PT/ dry needling with improvement. He also had fairly new dizziness for the last about two weeks, caused another fall and visited his PCP where he was referred to cardiology for follow up. He also saw [facility] which they recommend wisdom teeth removal. He noted he was not using gabapentin since his pain was under control, then restarted recently taking twice with notable benefit. We discussed he could take up to three times daily for maximal effect, which he agreed. He also noted gabapentin helped with headaches when he took it TID.     Today, patient presents reporting overall worsening pain. He reports gabapentin has been helpful but he has not been consistent which is reflected on PDMD, takes it BID due to pill burden and a times he skips altogether. We discussed that he has to be consistent for gabapentin to work, which he notes worsening pain when he does not take it. He stared flexeril per PCP but causes drowsiness, advised to try half dose and see if that helps without causing S/E. Briefly discussed about lyrica rotation but we will try to increase gabapentin and see if that helps. He agrees to increase night time gabapentin up on discussion. We will increase night time gabapentin to 900 mg and keep day time dose the same to avoid drowsiness which he notes while he tried in the past. He continues PT/ dry needling for hip/lower back pain with benefit.     Thoracic Outlet Syndrome - Myofascial pain - Chronic Pain Syndrome   Chronic, ongoing  - Continue following with PT  - Increase night time gabapentin to 900 mg and keep am dose 600 mg oral solution, refilled (was  taking BID)  - Continue clonazepam taper with PCP, seems like wean has paused.    Considerations for the future:  - Repeat TPIs PRN  - Continue follow up with [facility]   - Consider initiation of Butrans/buprenorphine product after weaning off of benzodiazepines and pain psych evaluation for COM should conservative measures fail, but hoping to avoid if possible  - Consider up titration of gabapentin oral solution or rotation to Lyrica     Return for Next scheduled follow up.    Requested Prescriptions     Signed Prescriptions Disp Refills    gabapentin (NEURONTIN) 250 mg/5 mL oral solution 1080 mL 1     Sig: Take 12 mL (600 mg total) by mouth every morning AND 18 mL (900 mg total) nightly.     No orders of the defined types were placed in this encounter.      HPI:  Mr. Calvin is seen in consultation at the request of Dr. Ave for evaluation and recommendations regarding His pain.     The patient was last seen in April. Since last visit, the patient has followed with PT, Cardiology, Psychiatry, and Neurology.      Today, patient presents reporting overall worsening pain. He reports gabapentin has been helpful but he has not been consistent which is reflected on PDMD, takes it BID due to pill burden and a times he skips altogether. We discussed that he has to be consistent for gabapentin to work, which he notes worsening pain when he does not take it. He stared flexeril per PCP but causes drowsiness, advised to try half dose and see if that helps without causing S/E. He continues PT/ dry needling with benefit.     Current Medications:  Gabapentin 600 mg TID- takes BID  Voltaren gel    Previous Medication Trials:  NSAIDS- ibuprofen  Antidepressants-   Anticonvulsant- gabapentin  Muscle relaxants- flexeril, baclofen  Topicals-voltaren gel  Short-acting opiates-percocet   Long-acting opiates-   Anxiolytics-   Other-     Previous Interventions  Medications, PT, surgery, injections with sports med  TOS decompression/PMR 11/16/21  TPI-05/10/22-very helpful.  Have repeated several times since  PT-2023  Left GTB CSI-sports med 05/27/22  Spinal accessory nerve block-11/04/22  Spinal accessory nerve block and pulsed RFA-12/29/22- had significant relief, 07/27/23- no relief    Allergies as of 12/07/2023 - Reviewed 12/06/2023   Allergen Reaction Noted    Latex Rash and Hives 09/16/2016    Penicillins Anaphylaxis 09/16/2016    Shellfish containing products Anaphylaxis 01/23/2017    Other Rash 12/16/2020    Trileptal [oxcarbazepine] Rash 07/19/2023      Current Outpatient Medications   Medication Sig Dispense Refill    albuterol 2.5 mg /3 mL (0.083 %) nebulizer solution Inhale 3 mL (2.5 mg total) by nebulization every six (6) hours as needed for wheezing or shortness of breath. 90 mL 1    azelastine (ASTELIN) 137 mcg (0.1 %) nasal spray Instill 2 sprays into each nostril two (2) times a day as needed for rhinitis. 30 mL 1    budesonide-formoterol (SYMBICORT) 160-4.5 mcg/actuation inhaler Inhale 1 puff by mouth Two (2) times a day. 10.2 g 5    cetirizine (ZYRTEC) 10 MG tablet Take 1 tablet (10 mg total) by mouth daily. 30 tablet 12    clonazePAM (KLONOPIN) 0.5 MG tablet Take 0.5 tablets (0.25 mg total) by mouth two (2) times a day as needed for anxiety. 30 tablet 5    cyclobenzaprine (FLEXERIL) 5  MG tablet Take 1 tablet (5 mg total) by mouth Three (3) times a day as needed for muscle spasms. 15 tablet 1    dextroamphetamine-amphetamine (ADDERALL) 20 mg tablet Take 1 tablet (20 mg total) by mouth two (2) times a day. 60 tablet 0    [START ON 12/09/2023] dextroamphetamine-amphetamine (ADDERALL) 20 mg tablet Take 1 tablet (20 mg total) by mouth two (2) times a day. 60 tablet 0    [START ON 01/08/2024] dextroamphetamine-amphetamine (ADDERALL) 20 mg tablet Take 1 tablet (20 mg total) by mouth two (2) times a day. 60 tablet 0    diclofenac sodium (VOLTAREN) 1 % gel Apply 2 g topically four (4) times a day. 100 g 0    doxycycline (VIBRA-TABS) 100 MG tablet Take 200 mg (2 tablets) within 24-72 hours of any condomless sexual encounter. 40 tablet 0    emtricitabine-tenofovir alafen (DESCOVY) 200-25 mg tablet Take 1 tablet by mouth daily. 90 tablet 1    EPINEPHrine (EPIPEN) 0.3 mg/0.3 mL injection Inject 0.3 mL (0.3 mg total) under the skin once for 1 dose. 2 each 0    gabapentin (NEURONTIN) 250 mg/5 mL oral solution Take 12 mL (600 mg total) by mouth Three (3) times a day. 1080 mL 2    inhalational spacing device Spcr Use as directed with albuterol and symbicort 1 each 1    ipratropium (ATROVENT) 42 mcg (0.06 %) nasal spray 1-2 sprays in each nostril as needed for drainage, up to 4 time daily. 15 mL 2    metoclopramide (REGLAN) 10 MG tablet Take 1/2 tablet (5 mg total) by mouth Three (3) times a day with a meal. 135 tablet 3    nebulizer and compressor (COMP-AIR NEBULIZER COMPRESSOR) Devi 1 each by Miscellaneous route nightly as needed. 1 each 0    needle, disp, 25 Franko (BD REGULAR BEVEL NEEDLES) 25 Demarquis x 5/8 Ndle For subcutaneous hormone injection. 25 each 0    nitrofurantoin, macrocrystal-monohydrate, (MACROBID) 100 MG capsule Take 1 capsule (100 mg total) by mouth two (2) times a day for 7 days. 14 capsule 0    omalizumab (XOLAIR) 300 mg/2 mL auto-injector Inject the contents of 1 pen (300 mg) under the skin every fourteen (14) days. 4 mL 11    omalizumab 75 mg/0.5 mL AtIn Inject the contents of 1 pen (75 mg) under the skin every fourteen (14) days. 1 mL 11    pantoprazole (PROTONIX) 40 MG tablet Take 1 tablet (40 mg total) by mouth daily. 90 tablet 3    prucalopride (MOTEGRITY) 2 mg Tab Take 1 tablet (2 mg total) by mouth daily. 90 tablet 3    safety needles (BD ECLIPSE) 25 Dina x 1 Ndle use for testosterone 12 each 0    safety needles (BD SAFETYGLIDE NEEDLE) 18 Kierre x 1 1/2 Ndle For drawing hormone injection 25 each 0    syringe, disposable, (EASY TOUCH LUER LOCK SYRINGE) 1 mL Syrg Use for weekly hormone injection. 4 each 0    syringe, disposable, (EASY TOUCH LUER LOCK SYRINGE) 1 mL Syrg Use for testosterone 12 each 0    syringe, disposable, 2.5 mL Syrg Use weekly 30 Syringe 2    testosterone cypionate (DEPOTESTOTERONE CYPIONATE) 200 mg/mL injection Inject 0.2 mL (40 mg total) into the muscle once a week. 12 mL 0    triamcinolone (KENALOG) 0.1 % cream Apply topically Two (2) times a day. 15 g 0    verapamil (CALAN) 40 MG tablet Take 1 tablet (40 mg  total) by mouth nightly for 14 days, THEN 1 tablet (40 mg total) two (2) times a day for 28 days. 60 tablet 2     Current Facility-Administered Medications   Medication Dose Route Frequency Provider Last Rate Last Admin    omalizumab CIPRIANO) injection 300 mg  300 mg Subcutaneous Q28 Days [name], [name], [name]   300 mg at 05/24/22 1138     Imaging/Tests:     Lab Results   Component Value Date    PLT 318 11/28/2023     Lab Results   Component Value Date    CREATININE 0.90 11/02/2023     Lab Results   Component Value Date    A1C 5.4 11/28/2023     Lab Results   Component Value Date    ALKPHOS 110 01/19/2023    BILITOT 0.5 01/19/2023    BILIDIR 0.20 01/19/2023    PROT 7.9 01/19/2023    ALBUMIN 4.3 01/19/2023    ALT 10 01/19/2023    AST 18 01/19/2023     Review Of Systems  Patient denies homicidal/suicidal ideation.    PHYSICAL EXAM:  BP 126/86  - Pulse 69  - Ht 162.6 cm (5' 4)  - Wt 68.9 kg (152 lb) Comment: pt reports - LMP 07/20/2017 (Exact Date)  - SpO2 99%  - BMI 26.09 kg/m??     Wt Readings from Last 3 Encounters:   12/05/23 68.9 kg (152 lb)   11/28/23 68.9 kg (152 lb)   11/17/23 68.9 kg (152 lb)     GENERAL:  The patient is well developed, well-nourished, and appears to be in no apparent distress.   HEAD/NECK:    Normocephalic/atraumatic. clear sclera, pupils not pinpoint  CV:  Warm and well perfused   LUNGS:   Normal work of breathing, no supplemental O2  EXTREMITIES:  No clubbing, cyanosis noted.  NEUROLOGIC:    The patient is alert and oriented, speech fluent, normal language.   MUSCULOSKELETAL:    Motor function  preserved. Good range of motion of all extremities.   GAIT:  The patient rises from a seated position with no difficulty and ambulates with nonantalgic gait without the assistance of a walking aid.   SKIN:   No obvious rashes, lesions, or erythema.  PSY:   Appropriate affect. No overt pain behaviors. No evidence of psychomotor retardation or agitation, no signs of intoxication.     We are delivering comprehensive, continuous, longitudinal care for this patient with chronic pain.

## 2023-12-06 NOTE — Unmapped (Signed)
 [facility] THERAP[facility] SERVICES AT [facility] [place]  OUTPATIENT PHYSICAL THERAPY  12/06/2023  Note Type: Progress Note  Progress Reporting Period: 08/24/2023 to 12/06/2023    Patient Name: [facility]  Date of Birth:[birth year]  Diagnosis:   Encounter Diagnosis   Name Primary?    Chronic right-sided low back pain without sciatica Yes     Referring MD:  Debrah Morene Cadet,*     Date of Onset of Impairment-08/23/2016  Date PT Care Plan Established or Reviewed-12/06/2023  Date PT Treatment Started-08/24/2023   Visit Count: 13  Plan of Care Effective Date:          Assessment/Plan:    Assessment  Assessment details:    I do think that we have made some progress with Deangelo in physical therapy.  His right low back pain has consistently improved (he describes it as dormant presently), he now has full pain free lumbar ROM and he has significantly improved right hip strength.  I suspect that his increase in right hip strength is the main reason why his right low back pain has gotten better.  From what I can tell, he has 3 issues happening.  First, he had some right hip/core trunk weakness that seemed to be creating instability, forcing his right low back musculature to compensate.  This issue has gotten significantly better with right hip/core/trunk strengthening.  Next, his increased lumbar lordosis posture seems to be causing pressure (patient describes it as compression feeling) in his lumbar spine around L5.  This seems to be causing facet referral, and possibly some nerve compression in the L5 nerve.  This second issue leads to the final issue: It appears that when he compresses that area of his spine, it irritates the nerves and causes spasms in his left hip external rotators.  We seem to have addressed the first issue, but I do believe that we can address the second two issues by we continuing to work on his posture (standing and sitting) to reduce his lumbar lordosis and strengthen his core to stabilize his lumbar spine, preventing the increased lordosis.  I suspect that a lot of the abdominal surgeries he's had (hernia and transition surgeries) have created increased core weakness.  At this point, he struggles to hold a plank for 20 seconds.  I expect that if we can increase his core strength so that he can hold his plank for closer to 1 1/2 to 2 minutes, it will take pressure off his L5 and keep his hip external rotators from spasming.  We agreed to focus on continuing with manual therapy for symptom relief while we focus on improving his core strength.  All questions answered.  This patient requires skilled physical therapy services to address the outlined impairments in order to return to his desired level of function.      Impairments: pain, muscular restrictions, hypertonicity, core weakness, impaired ADLs, joint restriction, decreased strength and gait deviation                      Therapy Goals      Goals:      Goals: Updated 12/06/2023.   In 12 weeks:  Goal #1: Patient will be independent with progressively monitored home exercise program.   Baseline/Current: COMPLETE.   Patient is familiar with home exercise program and performs it consistently.     Goal #2: Patient will demonstrate lumbar ROM within normal limits without pain in order to perform daily functional activities such as bending  to pick an object off the ground, twisting to reach into the patient's backseat or bend backwards in order to lift an object overhead.   Baseline/Current: COMPLETE.  Patient now has normal lumbar ROM with reports of tightness (see ROM testing).     Goal #3: Patient will demonstrate right hip strength within normal limits without pain in order to allow for functional tasks such as symmetrical walking, squatting and negotiating stairs.   Baseline/Current: NEARLY COMPLETE.  Patient has nearly normal right hip strength with residual weakness into extension (see manual muscle testing).      Goal #4: Patient will be able to lift at least 75 lb from floor to waist with good lifting mechanics in order to demonstrate good core and trunk strength.   Baseline/Current: PARTIALLY COMPLETE.  Patient reports that he can lift a case of water (up to 40-50 lb) without a problem.    Goal #5: Patient will report improvement in symptoms while sitting and ability to sit up to 1 hour with minimal symptoms.  Baseline/Current: PARTIALLY COMPLETE.  Patient reports that he now has periods when he can sit without a problem, but when he flares up he has difficulty sitting > 15 minutes.    Goal #6: Patient will demonstrate improved posture without cuing for pain </= 2/10.  Baseline/Current: IN PROGRESS.  Patient stands and sits in lordotic position.    Goal #7: Patient will be able to hold a front plank for >/= 1 minute to demonstrate improved core strength.  Baseline/Current: IN PROGRESS.  Patient can hold a front plank for ~ 20 seconds with some difficulty.    Plan    Therapy options: will be seen for skilled physical therapy services    Planned therapy interventions: 20560, 20561-Dry Needling 1-2, 3+ areas, 97010-Cold Packs/Hot Packs, 97012-Mechanical Traction, 97032, G0283-Electrical Stimulation (unattended, attended), 97110-Therapeutic Exercises, 97112-Neuromuscular Re-education, 97116-Gait Training, 97140-Manual Therapy, 97164-Re-evaluation (PT) and 97530-Therapeutic Activities      Frequency: 1x week    Duration in weeks: 8    Education provided to: patient.    Education provided: Surveyor, minerals, HEP and Treatment options and plan    Education results: verbalized good understanding, needs reinforcement, demonstrates understanding and needs further instruction.    Communication/Consultation: N/A.    Next visit plan:        Manual therapy/TDN as needed to calm left hip external rotators.  Progress core strengthening, especially plank.    Total Session Time: 30    Treatment rendered today:      Therapeutic Exercise: 30 minutes  - Patient Education: Assessment, Plan of Care, Home Exercise Program    Plan details: Access Code: [email or web address]  URL: [email or web address]  Date: 11/28/2023  Prepared by: Karena Harbour    Exercises  - Supine Posterior Pelvic Tilt  - 1 x daily - 7 x weekly - 1 sets - 20 reps - 5 hold  - Sidelying Thoracolumbar Mobilisation with Towel Roll  - 2 x daily - 7 x weekly - 1 sets - 1 reps - 5 minutes hold  - Clamshell with Resistance (Mirrored)  - 1 x daily - 7 x weekly - 2 sets - 10 reps - 3 hold  - Sidelying Hip Abduction (Mirrored)  - 1 x daily - 7 x weekly - 2 sets - 10 reps  - Hooklying Transversus Abdominis Palpation  - 1 x daily - 7 x weekly - 1 sets - 15 reps - 5 hold  - Supine March  - 1 x daily -  7 x weekly - 2 sets - 10 reps  - Supine 90/90 Alternating Toe Touch  - 1 x daily - 7 x weekly - 2 sets - 10 reps  - Full Plank  - 1 x daily - 7 x weekly - 1 sets - 3 reps - 30-45 hold  - Supine Bridge with Leg Extension  - 1 x daily - 7 x weekly - 2 sets - 10 reps - 5 hold  - Bird Dog  - 1 x daily - 7 x weekly - 2 sets - 10 reps  - Supine Dead Bug with Leg Extension  - 1 x daily - 7 x weekly - 2 sets - 10 reps  - Quadruped Hip Abduction with Resistance Loop  - 1 x daily - 7 x weekly - 2 sets - 10 reps  - Sternocleidomastoid Stretch  - 1 x daily - 7 x weekly - 1 sets - 3 reps - 30 hold  - Supine Piriformis Stretch with Foot on Ground  - 1 x daily - 7 x weekly - 3 sets - 10 reps  - Supine Bilateral Isometric ER With Mobilization Belt  - 1 x daily - 7 x weekly - 15 sets - 10 reps - 5 hold  - Supine Bilateral Isometric Hip Abduction With Mobilization Belt  - 1 x daily - 7 x weekly - 3 sets - 10 reps  - Supine Figure 4 Piriformis Stretch  - 1 x daily - 7 x weekly - 1 sets - 10 reps - 10 hold  - Supine LE Neural Mobilization  - 1 x daily - 7 x weekly - 2 sets - 10 reps      Subjective:     History of Present Condition     Date of onset:  08/23/2016    Date is approximation?: yes    Explanation:  Patient reports onset of symptoms in 2018.    History of Present Condition/Chief Complaint:  Mr. Fedewa is a 43 y.o. right hand dominant adult (male to male transition in process) who presents to outpatient physical therapy with chronic low back pressure, tightness and pain that began in 2018.  Patient cannot remember any specific traumatic mechanism of injury that may have been the result of his symptoms, but he does report that he broke his right leg 2x when he was younger.  The first one was when he was a baby.  The second time he broke it falling off the couch when he was 8.  He can't remember if it was his femur or tibia and he hasn't had a recent x-ray.  Subjective:  Mr. Deady reports that he feels that he's made a lot of progress since the start of physical therapy.  He has noticed that the right back pain he was feeling is now dormant and he isn't feeling it at this time.  However, he does feel like the pain has shifted from his right low back to a deep soreness in his left posterior hip.  He does feel stronger.  He has been able to lift a case of water (up to 40-50 lb) without a problem.  He can perform all of his daily functional activities, but they do hurt.  He is getting relief from therapy, but the pain comes back.  Pain:     Current pain rating:  6    At best pain rating:  5    At worst pain rating:  9  Location:  Left  posterior hip    Quality:  Radiating, tingling and numb    Progression:  No change      Objective:     Static Posture Assessment   Lumbar Spine-Increased lordosis.     Palpation   Left Hip   Hypertonic in the piriformis.  Tenderness of the piriformis.  Left Lumbar   Muscular-no left lumbar muscular tenderness  Right Lumbar   Muscular-no right lumbar muscular tenderness    Range of Motion  Lumbar  Active ROM  Lumbar-Active ROM   Flexion: 70 degrees   Extension: 20 degrees   Left lateral flexion: 35 degrees   Right lateral flexion: 40 degrees   Left rotation: WFL  Right rotation: [facility]    Joint Mobility  Lumbar  Lumbar Joint Mobility   Lumbar Joints that are WFL are L1, L2, L3 and L4.   Hypomobile at L5.   Pain at L5.   Lumbar comments:  L5-radiates to the posterior left hip/concordant    Strength  Left Hip   Planes of Motion   Flexion: 5  Extension: 4+  Abduction: 4+  External rotation: 4  Internal rotation: 5  Right Hip   Planes of Motion   Flexion: 5  Extension: 4+  Abduction: 5  External rotation: 5  Internal rotation: 5                                  I attest that I have reviewed the above information.  Signed: Karena JINNY Harbour, PT  12/06/2023 3:23 PM

## 2023-12-07 ENCOUNTER — Ambulatory Visit: Admit: 2023-12-07 | Discharge: 2023-12-08 | Payer: BLUE CROSS/BLUE SHIELD

## 2023-12-07 DIAGNOSIS — M545 Low back pain of over 3 months duration: Principal | ICD-10-CM

## 2023-12-07 DIAGNOSIS — G540 Brachial plexus disorders: Principal | ICD-10-CM

## 2023-12-07 DIAGNOSIS — M7918 Myalgia, other site: Principal | ICD-10-CM

## 2023-12-07 DIAGNOSIS — G894 Chronic pain syndrome: Principal | ICD-10-CM

## 2023-12-07 MED ORDER — GABAPENTIN 250 MG/5 ML ORAL SOLUTION
ORAL | 1 refills | 36.00000 days | Status: CP
Start: 2023-12-07 — End: ?
  Filled 2023-12-14: 36d supply, fill #0

## 2023-12-07 NOTE — Unmapped (Signed)
 Pt declined printed AVS let them know their AVS can be viewed through their My Chart.  Pt voiced understanding

## 2023-12-07 NOTE — Unmapped (Addendum)
 It was good to see you today.    Increase night time gabapentin dose to 900 mg, keep morning dose the same 600 mg. If unable to tolerate, go back to 600 mg twice daily    Keep your next scheduled appointment

## 2023-12-09 MED ORDER — DEXTROAMPHETAMINE-AMPHETAMINE 20 MG TABLET
ORAL_TABLET | Freq: Two times a day (BID) | ORAL | 0 refills | 30.00000 days | Status: CP
Start: 2023-12-09 — End: 2024-01-08
  Filled 2023-12-14: qty 60, 30d supply, fill #0

## 2023-12-11 NOTE — Unmapped (Signed)
 [facility] has made a second and final attempt to reach this patient to refill the following medication:Xolair .      We have left voicemails on the following phone numbers: [phone], have sent a text message to the following phone numbers: [phone], and have sent a Mychart questionnaire..    Dates contacted: 12/06/23, 12/11/23  Last scheduled delivery: 11/14/23    The patient may be at risk of non-compliance with this medication. The patient should call the [facility] at [phone]  Option 4, then Option 3: Allergy, Immunology, Pulmonary, Neurology to refill medication.    Vincent Waters   [facility] Specialty Technician

## 2023-12-11 NOTE — Unmapped (Signed)
 [facility] Refill Coordination Note    Specialty Medication(s) to be Shipped:   CF/Pulmonary/Asthma: Xolair     Other medication(s) to be shipped: No additional medications requested for fill at this time     [facility], DOB: Jun 12, 1981  Phone: [phone] (home) [phone] (work)      All above HIPAA information was verified with patient.     Was a Nurse, learning disability used for this call? No    Completed refill call assessment today to schedule patient's medication shipment from the [facility]  [phone]).  All relevant notes have been reviewed.     Specialty medication(s) and dose(s) confirmed: Regimen is correct and unchanged.   Changes to medications: Jkai reports no changes at this time.  Changes to insurance: No  New side effects reported not previously addressed with a pharmacist or physician: None reported  Questions for the pharmacist: No    Confirmed patient received a Conservation officer, historic buildings and a Surveyor, mining with first shipment. The patient will receive a drug information handout for each medication shipped and additional FDA Medication Guides as required.       DISEASE/MEDICATION-SPECIFIC INFORMATION        For patients on injectable medications: Patient currently has 0 doses left.  Next injection is scheduled for 6.28.25.    SPECIALTY MEDICATION ADHERENCE     Medication Adherence    Patient reported X missed doses in the last month: 0  Specialty Medication: omalizumab : XOLAIR  300 mg/2 mL auto-injector  Patient is on additional specialty medications: Yes  Additional Specialty Medications: omalizumab : XOLAIR  75 mg/0.5 mL Atin  Patient Reported Additional Medication X Missed Doses in the Last Month: 0  Patient is on more than two specialty medications: No              Were doses missed due to medication being on hold? No      omalizumab : XOLAIR  300 mg/2 mL auto-injector: 0 doses of medicine on hand       omalizumab : XOLAIR  75mg /0.5 mL auto-injector: 0 doses of medicine on hand     REFERRAL TO PHARMACIST     Referral to the pharmacist: Not needed      SHIPPING     Shipping address confirmed in Epic.     Cost and Payment: Patient has a $0 copay, payment information is not required.    Delivery Scheduled: Yes, Expected medication delivery date: 6.26.25.     Medication will be delivered via Same Day Courier to the prescription address in Epic WAM.    Vincent Waters   [facility]  Specialty Technician

## 2023-12-11 NOTE — Unmapped (Signed)
 [facility] [place]  OUTPATIENT PHYSICAL THERAPY  12/11/2023  Note Type: Treatment Note       Patient Name: Vincent Waters  Date of Birth:10-16-1980  Diagnosis:   Encounter Diagnosis   Name Primary?    Chronic right-sided low back pain without sciatica Yes     Referring MD:  Debrah Morene Cadet,*     Date of Onset of Impairment-08/23/2016  Date PT Care Plan Established or Reviewed-12/06/2023  Date PT Treatment Started-08/24/2023   Visit Count: 14  Plan of Care Effective Date:          Assessment/Plan:    Assessment  Assessment details:    Nice discussion with Vincent Waters about his current Plan of Care and where we move on from here.  I expect that, as long as he keeps his right hip strong by doing his right hip strengthening independently, his low back will feel better.  I also expect that we will have to keep up some level of maintenance therapy that includes dry needling for his right shoulder and hip, performing manual therapy when his symptoms flare-up.  However, I think our consistent focus for his back/sciatica should be to strengthen his core and focus on controlling his posture (reducing increased lordosis).  I suspect that, due to Vincent Waters extensive history of lumbar and abdominal surgeries, core weakness is a main driving factor for a lot of the lumbar pain/sciatica he is feeling.  He struggles with performing a plank longer than 20 seconds.  If we can increase this hold time to even just 1 minute, I expect that he will much better and have an easier time controlling his excessive lordosis.  He agrees with this plan.  All questions answered.  This patient requires skilled physical therapy services to address the outlined impairments in order to return to his desired level of function.      Impairments: pain, muscular restrictions, hypertonicity, core weakness, impaired ADLs, joint restriction, decreased strength and gait deviation                      Therapy Goals      Goals: Goals: Updated 12/06/2023.   In 12 weeks:  Goal #1: Patient will be independent with progressively monitored home exercise program.   Baseline/Current: COMPLETE.   Patient is familiar with home exercise program and performs it consistently.     Goal #2: Patient will demonstrate lumbar ROM within normal limits without pain in order to perform daily functional activities such as bending to pick an object off the ground, twisting to reach into the patient's backseat or bend backwards in order to lift an object overhead.   Baseline/Current: COMPLETE.  Patient now has normal lumbar ROM with reports of tightness (see ROM testing).     Goal #3: Patient will demonstrate right hip strength within normal limits without pain in order to allow for functional tasks such as symmetrical walking, squatting and negotiating stairs.   Baseline/Current: NEARLY COMPLETE.  Patient has nearly normal right hip strength with residual weakness into extension (see manual muscle testing).      Goal #4: Patient will be able to lift at least 75 lb from floor to waist with good lifting mechanics in order to demonstrate good core and trunk strength.   Baseline/Current: PARTIALLY COMPLETE.  Patient reports that he can lift a case of water (up to 40-50 lb) without a problem.    Goal #5: Patient will report improvement in symptoms  while sitting and ability to sit up to 1 hour with minimal symptoms.  Baseline/Current: PARTIALLY COMPLETE.  Patient reports that he now has periods when he can sit without a problem, but when he flares up he has difficulty sitting > 15 minutes.    Goal #6: Patient will demonstrate improved posture without cuing for pain </= 2/10.  Baseline/Current: IN PROGRESS.  Patient stands and sits in lordotic position.    Goal #7: Patient will be able to hold a front plank for >/= 1 minute to demonstrate improved core strength.  Baseline/Current: IN PROGRESS.  Patient can hold a front plank for ~ 20 seconds with some difficulty.    Plan    Therapy options: will be seen for skilled physical therapy services    Planned therapy interventions: 20560, 20561-Dry Needling 1-2, 3+ areas, 97010-Cold Packs/Hot Packs, 97012-Mechanical Traction, 97032, G0283-Electrical Stimulation (unattended, attended), 97110-Therapeutic Exercises, 97112-Neuromuscular Re-education, 97116-Gait Training, 97140-Manual Therapy, 97164-Re-evaluation (PT) and 97530-Therapeutic Activities      Frequency: 1x week    Duration in weeks: 8    Education provided to: patient.    Education provided: Surveyor, minerals, HEP and Treatment options and plan    Education results: verbalized good understanding, needs reinforcement, demonstrates understanding and needs further instruction.    Communication/Consultation: N/A.    Next visit plan:        Progress core strengthening, especially plank. Manual therapy/TDN as needed to calm left hip external rotators.      Total Session Time: 30    Treatment rendered today:      Therapeutic Exercise: 30 minutes  - Patient Education: Plan of Care, Home Exercise Program    Plan details: Access Code: [email or web address]  URL: [email or web address]  Date: 11/28/2023  Prepared by: Karena Harbour    Exercises  - Supine Posterior Pelvic Tilt  - 1 x daily - 7 x weekly - 1 sets - 20 reps - 5 hold  - Sidelying Thoracolumbar Mobilisation with Towel Roll  - 2 x daily - 7 x weekly - 1 sets - 1 reps - 5 minutes hold  - Clamshell with Resistance (Mirrored)  - 1 x daily - 7 x weekly - 2 sets - 10 reps - 3 hold  - Sidelying Hip Abduction (Mirrored)  - 1 x daily - 7 x weekly - 2 sets - 10 reps  - Hooklying Transversus Abdominis Palpation  - 1 x daily - 7 x weekly - 1 sets - 15 reps - 5 hold  - Supine March  - 1 x daily - 7 x weekly - 2 sets - 10 reps  - Supine 90/90 Alternating Toe Touch  - 1 x daily - 7 x weekly - 2 sets - 10 reps  - Full Plank  - 1 x daily - 7 x weekly - 1 sets - 3 reps - 30-45 hold  - Supine Bridge with Leg Extension  - 1 x daily - 7 x weekly - 2 sets - 10 reps - 5 hold  - Bird Dog  - 1 x daily - 7 x weekly - 2 sets - 10 reps  - Supine Dead Bug with Leg Extension  - 1 x daily - 7 x weekly - 2 sets - 10 reps  - Quadruped Hip Abduction with Resistance Loop  - 1 x daily - 7 x weekly - 2 sets - 10 reps  - Sternocleidomastoid Stretch  - 1 x daily - 7 x weekly - 1 sets - 3 reps -  30 hold  - Supine Piriformis Stretch with Foot on Ground  - 1 x daily - 7 x weekly - 3 sets - 10 reps  - Supine Bilateral Isometric ER With Mobilization Belt  - 1 x daily - 7 x weekly - 15 sets - 10 reps - 5 hold  - Supine Bilateral Isometric Hip Abduction With Mobilization Belt  - 1 x daily - 7 x weekly - 3 sets - 10 reps  - Supine Figure 4 Piriformis Stretch  - 1 x daily - 7 x weekly - 1 sets - 10 reps - 10 hold  - Supine LE Neural Mobilization  - 1 x daily - 7 x weekly - 2 sets - 10 reps      Subjective:     History of Present Condition     Date of onset:  08/23/2016    Date is approximation?: yes    Explanation:  Patient reports onset of symptoms in 2018.    History of Present Condition/Chief Complaint:  Mr. Maddix is a 43 y.o. right hand dominant adult (male to male transition in process) who presents to outpatient physical therapy with chronic low back pressure, tightness and pain that began in 2018.  Patient cannot remember any specific traumatic mechanism of injury that may have been the result of his symptoms, but he does report that he broke his right leg 2x when he was younger.  The first one was when he was a baby.  The second time he broke it falling off the couch when he was 8.  He can't remember if it was his femur or tibia and he hasn't had a recent x-ray.  Subjective:  Mr. Canterbury reports that he saw his neurologist, who recommended that he adjust his gabapentin  dose (more at night instead of during the day) and it's helping the sciatica.  His right low back is feeling better.  He has been doing his exercises consistently and they are helping his back.  He is still struggling with planks.  Pain:     Current pain rating:  2  Location:  Left posterior hip    Quality:  Aching    Progression:  Improved      Objective:     Static Posture Assessment   Lumbar Spine-Increased lordosis.                                   I attest that I have reviewed the above information.  Signed: Karena JINNY Harbour, PT  12/11/2023 12:28 PM

## 2023-12-14 MED FILL — XOLAIR 75 MG/0.5 ML SUBCUTANEOUS AUTO-INJECTOR: SUBCUTANEOUS | 28 days supply | Qty: 1 | Fill #10

## 2023-12-14 MED FILL — XOLAIR 300 MG/2 ML SUBCUTANEOUS AUTO-INJECTOR: SUBCUTANEOUS | 28 days supply | Qty: 4 | Fill #10

## 2023-12-27 DIAGNOSIS — G5702 Lesion of sciatic nerve, left lower limb: Principal | ICD-10-CM

## 2023-12-27 MED ADMIN — triamcinolone acetonide (KENALOG-40) injection 20 mg: 20 mg | INTRA_ARTICULAR | @ 19:00:00 | Stop: 2023-12-27

## 2023-12-27 MED ADMIN — dexAMETHasone (DECADRON) 4 mg/mL injection 2 mg: 2 mg | INTRA_ARTICULAR | @ 19:00:00 | Stop: 2023-12-27

## 2023-12-27 MED ADMIN — ropivacaine (NAROPIN) 5 mg/mL (0.5 %) injection 3 mL: 3 mL | INTRA_ARTICULAR | @ 19:00:00 | Stop: 2023-12-27

## 2023-12-27 NOTE — Unmapped (Addendum)
 Vincent Waters, 43 y.o. adult  Referring Provider: Ames Waters   Primary Care Provider: Debrah Morene Cadet, MD    CC: Follow-up of gastroparesis and constipation    *This note was written in part using Dragon dictation software and thus may contain transcription errors/typos.*    Assessment & Plan:     [facility] is a 43 y.o. adult with a PMH of gastroparesis, GERD, and constipation who presents for follow up. Overall, he is doing similarly compared to his last visit.  He continues to struggle with gastroparesis and continues to take metoclopramide  twice daily.  We discussed that he should have holidays from this given risk of tardive dyskinesia.  He states that after 2 weeks he will redevelop symptoms, so we decided on a 1 week holiday with resumption of metoclopramide  after that even if symptoms did not return.    Regarding GERD, discomfort from this has improved with the regurgitation has not.  He still waking up at night with reflux contents in his mouth which sometimes leads to aspiration.  Given he has had some response to PPI, will repeat EGD (last 1 done a year ago) to evaluate for healing of esophagitis.  If there is no evidence of esophagitis, his PPI is likely working for acid control even in the setting of ongoing nonacidic reflux.  If there is evidence of esophagitis, he may benefit from increasing his PPI to twice daily.    Regarding constipation, he is doing well on prucalopride so we will continue this once daily.  We discussed that his bowel prep for colonoscopy would involve a clear liquid diet several days in advance given gastroparesis and constipation.    Plan:  1) continue pantoprazole  40 mg once daily in the morning before meals (currently taking this correctly)  2) continue prucalopride 2 mg once daily  3) one week holiday from metoclopramide , continue BID dosing with occasional holidays to avoid tachyphylaxis and tardive dyskinesia symptoms  4) EGD to reassess for esophagitis in the setting of partial response to once daily PPI, colonoscopy to evaluate BRBPR  5) Follow up previously ordered breath test (scheduled for 07/14)    Vincent Waters was seen today for follow-up.    Diagnoses and all orders for this visit:    Heartburn  -     EGD; Future    Regurgitation of food  -     EGD; Future    Gastroparesis  -     EGD; Future    Chronic constipation  -     Colonoscopy; Future    Rectal bleeding  -     Colonoscopy; Future    Abdominal bloating        Vincent Waters [facility] seen and examined with Dr. Junette who is in agreement with above assessment and plan.    History of Present Illness:     [name] is a 43 y.o. adult with a PMH significant for gastroparesis, constipation, dysphagia, throracic outlet syndrome, and ADHD  s/p CCY and hysterectomy who presents for follow up. He was last seen by Dr. Eastern in 08/2023 at which time the following was noted:    Doing well on motegrity  for constipation. Has been able to titrate reglan  to lowest effective dose (bid) for gastroparesis. He reports LLQ abdominal pain, gas, and bloating. Reports some increased frequency in rectal bleeding and intermittent rectal pain. Also worsening bloating and gas even when he is having BM. Will plan for colonoscopy and rule out  SIBO. He was continues on prucalopride and metoclopramide . Colonoscopy and breath test were recommended by not completed.    Today he reports ongoing issues with gastroparesis but he has gotten used to this. He has good days and bad days. He continues to take prucalopride which helps. On this, he has a BM every day (may skip one BM per week).  He denies blood in BMs but reports blood on the paper most times. He has to strain about 20-30% of the time to have a BM. He has tried Miralax  in the past which caused bloating.    He has ongoing abdominal pain a bit worse than his last visit. He continues to take metoclopramide  twice daily before meals.  Pain is located in the periumbilical region and it begins to radiate upward. This feels different than GERD. He continues to take pantoprazole  40 mg once daily first thing in the morning (started 02/2023 after last EGD). He has identified eggs (bloating, abdominal pain, fecal urgency without a BM, relieved later with BM), wheat (bloating), and fiber as trigger foods for symptoms.    His breath test is scheduled for Monday 01/01/2024. He reports that he had a colonoscopy in 2018 which was reportedly unremarkable.     Prior workup:  EGD 02/2023 with LA grade A esophagitis and gastritis (esophageal and stomach bx normal).   GES 02/2023 with delayed gastric emptying (45% retained food at 4hrs).  AXR 04/2022: No significant stool burden  EGD 07/2019 with 3cm hiatal hernia and significant gastroparesis with retained food, examination c/b laryngospasm.   GES in 2019 with 22% retention at 4 hours (unclear if he was on narcotics at that time)  Colonoscopy in 2017 was normal.   TSH and A1c normal.   He has elevated ANA 1:640 and was evaluated by rheumatology without concern for systemic connective tissue disease.     Past Medical and Surgical History:     Past Medical History[1]    Past Surgical History[2]  Social History:     Social History[3]  Family History:     Family History[4]  Allergies:     Allergies[5]  Home Medications:     Current Outpatient Medications   Medication Instructions    albuterol  2.5 mg, Nebulization, Every 6 hours PRN    azelastine  (ASTELIN ) 137 mcg (0.1 %) nasal spray Instill 2 sprays into each nostril two (2) times a day as needed for rhinitis.    budesonide -formoterol  (SYMBICORT ) 160-4.5 mcg/actuation inhaler Inhale 1 puff by mouth Two (2) times a day.    cetirizine  (ZYRTEC ) 10 mg, Oral, Daily (standard)    clonazePAM  (KLONOPIN ) 0.25 mg, Oral, 2 times a day PRN    cyclobenzaprine  (FLEXERIL ) 5 mg, Oral, 3 times a day PRN    dextroamphetamine -amphetamine  (ADDERALL) 20 mg, Oral, 2 times a day (standard) dextroamphetamine -amphetamine  (ADDERALL) 20 mg, Oral, 2 times a day (standard)    [START ON 01/08/2024] dextroamphetamine -amphetamine  (ADDERALL) 20 mg, Oral, 2 times a day (standard)    diclofenac  sodium (VOLTAREN ) 2 g, Topical, 4 times a day    doxycycline  (VIBRA -TABS) 100 MG tablet Take 200 mg (2 tablets) within 24-72 hours of any condomless sexual encounter.    emtricitabine -tenofovir  alafen (DESCOVY ) 200-25 mg tablet 1 tablet, Oral, Daily (standard)    EPINEPHrine  (EPIPEN ) 0.3 mg, Subcutaneous, Once    gabapentin  (NEURONTIN ) 250 mg/5 mL oral solution Take 12 mL (600 mg total) by mouth every morning AND 18 mL (900 mg total) nightly.    inhalational spacing device Spcr  Use as directed with albuterol  and symbicort     ipratropium (ATROVENT ) 42 mcg (0.06 %) nasal spray 1-2 sprays in each nostril as needed for drainage, up to 4 time daily.    metoclopramide  (REGLAN ) 10 MG tablet Take 1/2 tablet (5 mg total) by mouth Three (3) times a day with a meal.    nebulizer and compressor (COMP-AIR NEBULIZER COMPRESSOR) Devi 1 each, Miscellaneous, Nightly PRN    needle, disp, 25 Merle (BD REGULAR BEVEL NEEDLES) 25 Salar x 5/8 Ndle For subcutaneous hormone injection.    nitrofurantoin , macrocrystal-monohydrate, (MACROBID ) 100 MG capsule 100 mg, Oral, 2 times a day (standard)    omalizumab  (XOLAIR ) 300 mg/2 mL auto-injector Inject the contents of 1 pen (300 mg) under the skin every fourteen (14) days.    omalizumab  75 mg/0.5 mL AtIn Inject the contents of 1 pen (75 mg) under the skin every fourteen (14) days.    pantoprazole  (PROTONIX ) 40 mg, Oral, Daily (standard)    prucalopride (MOTEGRITY ) 2 mg, Oral, Daily (standard)    safety needles (BD ECLIPSE) 25 Austan x 1 Ndle use for testosterone     safety needles (BD SAFETYGLIDE NEEDLE) 18 Hudsen x 1 1/2 Ndle For drawing hormone injection    syringe, disposable, (EASY TOUCH LUER LOCK SYRINGE) 1 mL Syrg Use for weekly hormone injection.    syringe, disposable, (EASY TOUCH LUER LOCK SYRINGE) 1 mL Syrg Use for testosterone     syringe, disposable, 2.5 mL Syrg Use weekly    testosterone  cypionate (DEPOTESTOTERONE CYPIONATE) 40 mg, Intramuscular, Weekly    triamcinolone  (KENALOG ) 0.1 % cream Topical, 2 times a day (standard)    verapamil  (CALAN ) 40 MG tablet Take 1 tablet (40 mg total) by mouth nightly for 14 days, THEN 1 tablet (40 mg total) two (2) times a day for 28 days.     Objective:     Vital Signs:  Vitals:    12/28/23 1142   BP: 124/83   BP Position: Sitting   Pulse: 77   Temp: 36.8 ??C (98.2 ??F)   Weight: 69.4 kg (153 lb)   Height: 162.6 cm (5' 4.02)      Body mass index is 26.25 kg/m??.    Physical Exam:   Constitutional: well appearing, in no distress  Eyes: EOMI, no scleral icterus  HENT: moist mucous membranes, trachea midline  Cardiovascular: normal rate, regular rhythm, no lower extremity edema  Respiratory: CTAB with good air movement bilaterally  Gastrointestinal: bowel sounds present, abdomen non-tender, non-distended  Patient declined rectal exam today  Skin: normal turgor, no jaundice  Neurological: normal speech, no facial asymetry  Psychiatric: A&OX4, normal affect    Lab Results   Component Value Date    NA 142 12/26/2022    K 5.0 12/26/2022    CL 110 (H) 12/26/2022    CO2 30.0 12/26/2022    BUN 11 12/26/2022    CREATININE 0.90 11/02/2023    GLU 83 12/26/2022    CALCIUM 9.6 12/26/2022    MG 1.9 11/19/2021    PHOS 3.9 11/19/2021     Lab Results   Component Value Date    BILITOT 0.5 01/19/2023    BILIDIR 0.20 01/19/2023    PROT 7.9 01/19/2023    ALBUMIN 4.3 01/19/2023    ALT 10 01/19/2023    AST 18 01/19/2023    ALKPHOS 110 01/19/2023     Lab Results   Component Value Date    WBC 4.9 11/28/2023    HGB 14.4 11/28/2023  HCT 43.6 11/28/2023    PLT 318 11/28/2023     No results found for: PT, INR, APTT    Celiac serologies negative in 2020/2021    Imaging:  GES 03/16/2023:  *  12% at 30 min (Normal<30%)   *  22% at 60 min (10%<Normal<70%)   *  34% at 120 min (40%<Normal) *  47% at 180 min (70%<Normal)   *  55% at 240 min (90%<Normal)     Procedures:  EGD for heartburn and regurgitation 03/10/2023:  - A small hiatal hernia was present.  - LA Grade A (one or more mucosal breaks less than 5 mm, not extending between tops of 2 mucosal folds) esophagitis was found. Biopsies negative for EOE  - Segmental moderate inflammation characterized by erythema, friability and linear erosions was found in the gastric body and in the gastric antrum. Biopsies negative for H. pylori  - The exam of the stomach was otherwise normal.  - The examined duodenum was normal.    Norman Slovak, MD PhD  Gastroenterology Fellow  12/28/2023 12:39 PM         [1]   Past Medical History:  Diagnosis Date    Anxiety     Arthritis     Asthma (HHS-HCC)     Autoimmune autonomic neuropathy     Breast cyst     Depression     Financial difficulties     Food intolerance March 2018    Headache, tension-type     Lack of access to transportation     Migraines     Neuromuscular disorder        Rectal bleeding     Restless leg syndrome     Scleroderma        Seizures    07/27/2014    [facility] neurology    Sleep apnea treated with continuous positive airway pressure (CPAP)     Visual impairment    [2]   Past Surgical History:  Procedure Laterality Date    BREAST LUMPECTOMY      CHOLECYSTECTOMY  08/29/2012    COSMETIC SURGERY      HYSTERECTOMY Bilateral     08/2017    MASTECTOMY      04/2018    OOPHORECTOMY  2005    laparotomy    PR CYSTOURETHROSCOPY N/A 08/30/2017    Procedure: CYSTOURETHROSCOPY (SEPARATE PROCEDURE);  Surgeon: Rosaline Elias Kennedy, MD;  [facility];  Service: Advanced Laparoscopy    PR EXCISION TURBINATE,SUBMUCOUS Bilateral 01/04/2021    Procedure: SUBMUCOUS RESECTION INFERIOR TURBINATE, PARTIAL OR COMPLETE, ANY METHOD;  Surgeon: Adam Swaziland Kimple, MD;  [facility]  Service: ENT    PR INCISE TENDON/MUSCLE,SHLDR,SINGLE Right 11/16/2021    Procedure: RIGHT PEC MINOR RELEASE;  Surgeon: Lawayne Patience, MD;  Location: MAIN OR [place];  Service: Vascular    PR LAPAROSCOPY W TOT HYSTERECTUTERUS <=250 GRAM  W TUBE/OVARY N/A 08/30/2017    Procedure: LAPAROSCOPY, SURGICAL, WITH TOTAL HYSTERECTOMY, FOR UTERUS 250 G OR LESS; W/REMOVAL TUBE(S) AND/OR OVARY(S);  Surgeon: Rosaline Elias Kennedy, MD;  Location: [facility] OR [facility];  Service: Advanced Laparoscopy    PR MASTECTOMY, SIMPLE, COMPLETE Bilateral 01/09/2020    Procedure: MASTECTOMY, SIMPLE, COMPLETE;  Surgeon: Pauline Welford Stapler, MD;  Location: MAIN OR [place];  Service: Plastics    PR NEUROPLASTY BRACHIAL PLEXUS,OPEN Right 11/16/2021    Procedure: RIGHT TOS DECOMPRESSION;  Surgeon: Lawayne Patience, MD;  Location: MAIN OR [facility];  Service: Vascular    PR  REPAIR OF NASAL SEPTUM N/A 01/04/2021    Procedure: SEPTOPLASTY OR SUBMUCOUS RESECTION, WITH OR WITHOUT CARTILAGE SCORING, CONTOURING OR REPLACEMENT WITH GRAFT;  Surgeon: Adam Swaziland Kimple, MD;  Location: [facility] OR [facility];  Service: ENT    PR UPPER GI ENDOSCOPY,BIOPSY N/A 08/13/2019    Procedure: UGI ENDOSCOPY; WITH BIOPSY, SINGLE OR MULTIPLE;  Surgeon: Eleanor Dewey Sorrel, MD;  Location: [facility] PROCEDURES [facility];  Service: Gastroenterology    PR UPPER GI ENDOSCOPY,BIOPSY N/A 03/10/2023    Procedure: UGI ENDOSCOPY; WITH BIOPSY, SINGLE OR MULTIPLE;  Surgeon: Imelda Kerbs, MD;  Location: GI PROCEDURES [facility];  Service: Gastroenterology    SINUS SURGERY  12/20    Turbinate reduction   [3]   Social History  Socioeconomic History    Marital status: Single     Spouse name: None    Number of children: None    Years of education: None    Highest education level: None   Tobacco Use    Smoking status: Never     Passive exposure: Never    Smokeless tobacco: Never   Vaping Use    Vaping status: Never Used   Substance and Sexual Activity    Alcohol  use: Not Currently    Drug use: Not Currently    Sexual activity: Not Currently     Partners: Male   Social History Narrative    ** Merged History Encounter ** Social Drivers of Health     Financial Resource Strain: Patient Declined (10/06/2023)    Overall Financial Resource Strain (CARDIA)     Difficulty of Paying Living Expenses: Patient declined   Food Insecurity: Patient Declined (10/06/2023)    Hunger Vital Sign     Worried About Running Out of Food in the Last Year: Patient declined     Ran Out of Food in the Last Year: Patient declined   Transportation Needs: Patient Declined (10/06/2023)    PRAPARE - Transportation     Lack of Transportation (Medical): Patient declined     Lack of Transportation (Non-Medical): Patient declined   Physical Activity: Unknown (07/27/2021)    Exercise Vital Sign     Days of Exercise per Week: 1 day   Stress: Stress Concern Present (07/27/2021)    [employer] of Occupational Health - Occupational Stress Questionnaire     Feeling of Stress : To some extent   Social Connections: Moderately Isolated (07/27/2021)    Social Connection and Isolation Panel     Frequency of Communication with Friends and Family: More than three times a week     Frequency of Social Gatherings with Friends and Family: More than three times a week     Attends Religious Services: More than 4 times per year     Active Member of [employer] or Organizations: No     Attends Banker Meetings: Never     Marital Status: Never married   Housing: Patient Declined (10/06/2023)    Housing     Within the past 12 months, have you ever stayed: outside, in a car, in a tent, in an overnight shelter, or temporarily in someone else's home (i.e. couch-surfing)?: Patient refused     Are you worried about losing your housing?: Patient refused   [4]   Family History  Problem Relation Age of Onset    Cancer Mother         breast    Thyroid disease Mother     Immunodeficiency Mother     Seizures Mother  Migraines Mother     Hypothyroidism Mother     Hypertension Father     Breast cancer Sister     Cancer Sister         breast    Cancer Maternal Uncle 74        Stomach Cancer Ovarian cancer Maternal Grandmother     Breast cancer Maternal Grandmother     Cataracts Maternal Grandmother    [5]   Allergies  Allergen Reactions    Latex Rash and Hives    Penicillins Anaphylaxis     Pt can tolerate amoxicillin    Has patient had a PCN reaction causing immediate rash, facial/tongue/throat swelling, SOB or lightheadedness with hypotension: no   Has patient had a PCN reaction causing severe rash involving mucus membranes or skin necrosis: no  Has patient had a PCN reaction that required hospitalization: unknown  Has patient had a PCN reaction occurring within the last 10 years: no  If all of the above answers are NO, then may proceed with Cephalosporin use.    Shellfish Containing Products Anaphylaxis    Other Rash     Nifedipine  0.3%, Lidocaine  1.5% in petrolatum ointment caused rash on his Bottom    Trileptal [Oxcarbazepine] Rash

## 2023-12-27 NOTE — Unmapped (Signed)
 [facility]  Phone: [phone]         After Visit Instructions:    Medications: Please take a 1 week break from your metoclopramide , then resume twice daily dosing after that.  It would be great to take a 1 week holiday from this at least once a month to prevent your body from getting used to the medication and to prevent side effects including tardive dyskinesia.  You should continue your prucalopride once daily and your pantoprazole  once daily in the morning before meals as you have been doing.    Procedures: We have ordered a colonoscopy and an upper endoscopy for you. You should get a phone call to schedule this; however, if you do not hear from the schedulers within a week or two, please call them directly at [phone]. You can also call this number to reschedule or cancel a procedure, for bowel prep instructions, etc.      Follow up: I will see you back in clinic in 4 months. If you need to contact me, you may either send me a MyChart message or call the office and ask to speak with the nurse. MyChart messages are for non-urgent questions and it may take a few days to receive a response.

## 2023-12-27 NOTE — Unmapped (Signed)
 [facility]       Patient Name:Lary Abbeville  MRN: [id number]  DOB: 1980/12/21  Age: 43 y.o.   Date: 01/01/2024    SUBJECTIVE     Chief complaint: Hip Pain (Patient presents with left sided hip, butt and leg pain, numbness, and tingling. He reports that it starts at the bottom of his gluteus maximus.)      History of present Illness:  Keygan Jaeshawn Silvio is a 43 y.o. adult with  has a past medical history of Anxiety, Arthritis, Asthma (HHS-HCC), Autoimmune autonomic neuropathy, Breast cyst, Depression, Financial difficulties, Food intolerance (March 2018), Headache, tension-type, Lack of access to transportation, Migraines, Neuromuscular disorder, Rectal bleeding, Restless leg syndrome, Scleroderma, Seizures (07/27/2014), Sleep apnea treated with continuous positive airway pressure (CPAP), and Visual impairment. who comes in today in with complaint of Hip Pain (Patient presents with left sided hip, butt and leg pain, numbness, and tingling. He reports that it starts at the bottom of his gluteus maximus.)  .    History of Present Illness  Frazier Wali Reinheimer is a 43 year old male who presents with left-sided gluteal pain radiating to the foot.    He experiences significant left-sided pain starting in the gluteal area and radiating down to the foot, described as throbbing and worsening with sitting, causing ankle swelling. Walking and standing provide some relief, while squatting does not help. He also experiences tingling sensations.    He has undergone various treatments, including dry needling and injections, which initially provided relief but became less effective over time. The injections were administered over a period of one to two years, with diminishing returns after each session.    Physical therapy has been part of his management, focusing on strengthening exercises. Despite these efforts, the pain persists, particularly when pressure is applied to the area, such as during sitting. He notes that the pain is not located in his back, although he does feel pressure there.    He has been checked for bursitis, and the pain trajectory includes the gluteal area, radiating down the leg and wrapping around to the foot. No pain when sitting up with legs dangling. He reports numbness and tingling when certain motions are performed. No back pain but notes pressure in the back.    There is a concern for possible sciatic nerve involvement.  Patient is presenting for a diagnostic and possibly therapeutic piriformis injection.  Patient presented for further evaluation        OBJECTIVE   LMP 07/20/2017 (Exact Date)   General/Constitutional: NAD    Comprehensive Left Hip Exam:  Inspection:   Ecchymosis: No  TTP:   piriformis  ROM  Int: Normal  Ext: Normal  Flexion: Normal  Strength:  Abd: 5/5  Flex: 5/5  Special Test:  Log roll: negative  FADIR: negative  FABER: mildly positive  Meralgia: negative  Seated Piriformis: positive  Resisted External Derotation: mildly positive  Long Stride Walking Test: negative  Leg lengths: equal  Supine to sit test: not done    Diagnostic Imaging   Imaging (if performed) was reviewed with the patient.    No new Imaging was completed during this enounter.     Procedure   US  guided Piriformis Injection: left    Consent   After discussing the various treatment options for the condition, It was agreed that a corticosteroid injection would be the next step in treatment. The nature of and the indications for a corticosteroid and/or local anaesthetic injection were  reviewed in detail with the patient today. The inherent risks of injection including infection, allergic reaction, increased pain, incomplete relief or temporary relief of symptoms, alterations of blood glucose levels requiring careful monitoring and treatment as indicated, tendon, ligament or articular cartilage rupture or degeneration, nerve injury, skin depigmentation, and/or fatty atrophy were discussed.   Procedure   After the risks and benefits of the procedure were explained,verbal consent was given, and a procedural time-out was performed. Thepiriformis and surrounding structures were visualized with ultrasound.  The site for the injection was properly marked and prepped with Chlorhexadine solution.  The injection site was anesthetized with ethyl chloride and 4cc of 1% Lidocaine . Using ultrasound guidance, the piriformis was visualized and injected with 20 milligrams of Kenalog , 2 milligrams of Dexamethasone , 3 cc of Ropivicaine using a sterile technique and a 22 Jaciel 3.5 inch needle. During injection, there was unrestricted flow and care was taken not to inject corticosteroid into the skin or subcutaneous tissues. There were no complications during the procedure.    Post procedure a sterile band-aide was applied. Post-injection instructions were given regarding post-procedure care, when to follow up in clinic and what to expect from the procedure. The patient tolerated the injection well and was discharged without complication.    NEED FOR SONOGRAPHIC GUIDANCE    Given the complexity of this problem, the anatomic location of this structure, sonographic guidance is recommended to prevent injury to neurovascular structures and confirm accuracy of injection. The accuracy of doing these injections blind is poor and the benefit to the patient by using ultrasound guidance is significant to avoid complications.     Reference:  American Medical Society for Sports Medicine (AMSSM) position statement: interventional musculoskeletal ultrasound in sports medicine.  Garnetta ALEC Hurst MM, Adams E, Berkoff D, Concoff AL, Gladstone LELON Claudene DOROTHA Mallissa J Sports Med. 2014 Oct 20. pii: [id number]. doi: 10.1136/[id number]      ASSESSMENT AND PLAN     Pt is a 43 y.o. year old adult with:     Diagnosis ICD-10-CM Associated Orders   1. Piriformis syndrome, left  G57.02 dexAMETHasone  (DECADRON ) 4 mg/mL injection 2 mg     ropivacaine  (NAROPIN ) 5 mg/mL (0.5 %) injection 3 mL     triamcinolone  acetonide (KENALOG -40) injection 20 mg          Assessment & Plan  Piriformis Syndrome  Chronic left gluteal pain radiating to the foot, exacerbated by sitting, relieved by walking. Positive test indicates piriformis muscle irritation and potential sciatic nerve involvement. Differential includes trochanteric bursitis and gluteal tendinopathy  but constellation of symptoms suggests piriformis syndrome.  - Administer gluteal injection for diagnostic and therapeutic benefits.  -Procedure note as detailed above.   -Return/Emergent precautions were discussed. Post-injection care was discussed in great detail.     Trochanteric Bursitis  Trochanteric bursitis with pain radiating from hip to foot. Symptoms overlap with piriformis syndrome, but distinct pain pattern suggests concurrent bursitis.  - Continue physical therapy focusing on strengthening and balance exercises.    Gluteal Muscle Strain  Pain in gluteal region with muscle tightness. Symptoms overlap with piriformis syndrome and trochanteric bursitis, but muscle strain considered due to tightness and relief from needling.  - Continue physical therapy focusing on strengthening and balance exercises.    Home exercise program: YES  PT referral: Continue with physical therapy.   OTC meds: no  Prescription medication: no  Brace/Orthotic: no  Injection: yes  Imaging: no  Labs: no  Specialty referral: no  OA  supplement handout: no    - RTC if symptoms worsen or fail to improve    Rozelle Bennett, MD, [facility]  [facility]   [employer]  [facility]

## 2023-12-27 NOTE — Unmapped (Signed)
 Procedural Timeout    Correct patient?: yes    Correct procedure and correct site?: yes    Safety Precautions: Allergies reviewed?: yes    All relevant documents and studies (history and physical, laboratory, pathology, radiology, etc.) available?: yes    Consent completed?: yes    Required items for procedure (implants, devices, special equipment) available: yes    Expiration dates checked?: yes    Site marked, if applicable?:yes

## 2023-12-28 ENCOUNTER — Ambulatory Visit: Admit: 2023-12-28 | Discharge: 2023-12-29 | Payer: BLUE CROSS/BLUE SHIELD

## 2023-12-28 DIAGNOSIS — K3184 Gastroparesis: Principal | ICD-10-CM

## 2023-12-28 DIAGNOSIS — R1110 Vomiting, unspecified: Principal | ICD-10-CM

## 2023-12-28 DIAGNOSIS — K5909 Other constipation: Principal | ICD-10-CM

## 2023-12-28 DIAGNOSIS — K625 Hemorrhage of anus and rectum: Principal | ICD-10-CM

## 2023-12-28 DIAGNOSIS — R140 Abdominal distension (gaseous): Principal | ICD-10-CM

## 2023-12-28 DIAGNOSIS — R12 Heartburn: Principal | ICD-10-CM

## 2023-12-28 NOTE — Unmapped (Signed)
 Addended by: JUNETTE FONDA CROME on: 12/28/2023 01:39 PM     Modules accepted: Level of Service

## 2023-12-28 NOTE — Unmapped (Signed)
 ASSESSMENT/PLAN:    We had a good discussion with the patient regarding the problems listed below. The detailed plan is outlined below. Return/Emergent precautions were discussed. All questions were answered and patient agrees with the treatment plan moving forward.     Diagnoses and all orders for this visit:    Gluteal pain  I had a good discussion with the patient.  Gluteal pain can be multifactorial.  There is a concern that the pain could be originating from the anterior, lateral or posterior hip.  Interventions have been performed on the anterior and lateral hip without relief.  Pain seems to be more deep-rooted with provocative testing pointing towards possible piriformis syndrome.  And patient has pain along the sciatic nerve distribution.  We discussed that the patient to follow-up in sports medicine clinic for diagnostic ultrasound and possible piriformis injection that can provide both diagnostic and therapeutic benefit.  Number was provided to schedule appointment.  Will follow-up with the patient likely later in the week.  Return/emergent precautions were discussed.  All questions were answered and the patient agrees with the treatment plan moving forward.    -Sports Med Eval  -Consider piriformis injection      Lainie Daubert, MD   [facility]  [facility]   [employer]  [place]   12/25/2023      Gluteal Pain       SUBJECTIVE:    Mr. Fesler is a 43 y.o. adult that presents to [facility]  today regarding the following issues:    Gluteal Pain  Patient is presenting for left hip pain.  He has left hip pain chronically for years.  There is a concern for possible trochanteric bursitis, lumbar radiculopathy, or possible piriformis syndrome.  Patient has completed physical therapy and had intra-articular hip injections and trochanteric bursa injections without relief.  His workup has been extensive.  He states the pain radiates down his posterior leg and originates in the gluteal and travels down the sciatic nerve distribution.  Denies any leg weakness.  Denies saddle anesthesia, lower extremity weakness, or bladder/bowel incontinence. Patient is presenting for further evaluation.    I have reviewed the patients problem list, medical history, surgical history, laboratory history and recent hospitalizations, current medications, allergies, and social history and updated them as needed.    Review of symptoms:  Negative unless  Otherwise stated in HPI.     OBJECTIVE:    VITALS:   Vitals:    12/25/23 1126   BP: 116/91   Pulse: 95   Temp: 36.9 ??C (98.5 ??F)    Wt:   Wt Readings from Last 3 Encounters:   12/28/23 69.4 kg (153 lb)   12/25/23 68.9 kg (152 lb)   12/07/23 68.9 kg (152 lb)       Physical Exam     Gen: Pleasant and cooperative in NAD, resting comfortably, appears stated age  Head: Normocephalic, atraumatic  EENT: PERRLA, EOMI, sclera clear,  CV: Normal S1 S2 no m/r/g  Resp: Clear to ascultation bilaterally without adventitious sounds  Msk:   Comprehensive Left Hip Exam:  Inspection:   Ecchymosis: No  TTP:   greater trochanter, proximal IT Band, piriformis  ROM  Int: Normal  Ext: Normal  Flexion: Normal  Strength:  Abd: 5/5  Flex: 5/5  Special Test:  Log roll: Negative  FADIR: Negative  FABER: Negative  Meralgia: Negative  Seated Piriformis: Positive  Resisted External Derotation: Positive  Long Stride  Walking Test: Not examined  Leg lengths: equal  Supine to sit test: Not examined   Ext:  Pulses are palpable +2 in all four extremities, no swelling      I personally spent 30 minutes face-to-face and non-face-to-face in the care of this patient, which includes all pre, intra, and post visit time on the date of service.    Labs  @LABRESULTS @     Imaging  External ECG-8 days to 15 days (ZIO XT)  Ambulatory ECG Report    Patient: Briscoe Daniello  MRN: [id number]  Interpreting provider: Concha JAYSON An, MD     Indication: Palpitations    Conclusions:  -  Ambulatory ECG monitoring was performed from 10/26/23 to 11/09/23, with   ~14 days of analyzable data.  -  The predominant rhythm was sinus rhythm, with sinus rate ranging from   49 to 156 and averaging 79 bpm.  -  Rare PACs were recorded with no recorded episodes of SVT.   -  Rare PVCs were recorded with no recorded episodes of wide complex   tachycardia.  -  No pauses >3 seconds or high-grade AV block detected.  -  No atrial fibrillation detected.    - Overall, no significant arrhythmia identified.   - Patient-initiated recordings/events correlated with sinus rhythm.    Signed electronically by: Concha JAYSON An, MD   December 05, 2023 1:30 PM    (Details and rhythm strips are available for review in the linked PDF   document)          Rozelle Bennett, MD, [facility]  [facility]   [employer]  [place]   12/25/2023

## 2023-12-29 DIAGNOSIS — M25572 Pain in left ankle and joints of left foot: Principal | ICD-10-CM

## 2023-12-29 DIAGNOSIS — M7918 Myalgia, other site: Principal | ICD-10-CM

## 2023-12-29 DIAGNOSIS — G8929 Other chronic pain: Principal | ICD-10-CM

## 2023-12-29 DIAGNOSIS — Z79899 Other long term (current) drug therapy: Principal | ICD-10-CM

## 2023-12-29 MED ORDER — CABOTEGRAVIR ER 600 MG/3 ML (200 MG/ML) IM SUSPENSION,EXTENDED RELEASE
INTRAMUSCULAR | 11 refills | 0.00000 days | Status: CP
Start: 2023-12-29 — End: ?

## 2023-12-29 NOTE — Unmapped (Signed)
 Hi Vincent Waters,    It was great to see you today. I look forward to seeing you at your next visit. In the meantime, please feel free to message me on mychart with any non-time-sensitive questions or requests. For any time-sensitive matters (including completion of medical clearance, school/work physical or other forms), please call [phone] to request a phone, video, or in-person visit with me or one of my colleagues.     Additionally, [facility] has an Urgent Care at our [facility] location! Call [phone] for same-day appointments, available Monday-Friday 7am-8pm; Saturday and Sunday 12pm-5pm. If you think you are having an emergency, please call 911 or go to your nearest emergency department.     If you ever need urgent help with your mental health, including for any thoughts of self-harm or suicide, please call the [place] mental health crisis line at [phone], or the national suicide prevention hotline at 713-525-3688.     Take care,    Valdene Garret, MD, MPH (he/him)  [facility] at [facility]  [address]   [place], [place] [address]  Phone: [phone]  Fax: [phone]

## 2023-12-29 NOTE — Unmapped (Signed)
 [facility] AT [facility] [place]  OUTPATIENT PHYSICAL THERAPY  12/29/2023  Note Type: Treatment Note       Patient Name: Norvel Wenker  Date of Birth:Apr 04, 1981  Diagnosis:   Encounter Diagnoses   Name Primary?    Chronic right-sided low back pain without sciatica Yes    Low back pain radiating to right lower extremity      Referring MD:  Debrah Morene Cadet,*     Date of Onset of Impairment-08/23/2016  Date PT Care Plan Established or Reviewed-12/06/2023  Date PT Treatment Started-08/24/2023   Visit Count: 15  Plan of Care Effective Date:          Assessment/Plan:    Assessment  Assessment details:    Arval returns to therapy with very hypertonic and tender left hip external rotators following his injection with Dr. Irvin.  Focused on manual therapy to relieve symptoms.  Immediately afterwards, he stated he felt better.  Recommended that he push his plank time to 25 seconds and he add supine glute squeeze and unilateral isometric glute activation to the exercises he's doing consistently.  He agreed to do this.  I still expect the stronger we can get his core and hips, the fewer flare-ups he's going to have and the better he's going to feel.  All questions answered.  This patient requires skilled physical therapy services to address the outlined impairments in order to return to his desired level of function.      Impairments: pain, muscular restrictions, hypertonicity, core weakness, impaired ADLs, joint restriction, decreased strength and gait deviation                      Therapy Goals      Goals:      Goals: Updated 12/06/2023.   In 12 weeks:  Goal #1: Patient will be independent with progressively monitored home exercise program.   Baseline/Current: COMPLETE.   Patient is familiar with home exercise program and performs it consistently.     Goal #2: Patient will demonstrate lumbar ROM within normal limits without pain in order to perform daily functional activities such as bending to pick an object off the ground, twisting to reach into the patient's backseat or bend backwards in order to lift an object overhead.   Baseline/Current: COMPLETE.  Patient now has normal lumbar ROM with reports of tightness (see ROM testing).     Goal #3: Patient will demonstrate right hip strength within normal limits without pain in order to allow for functional tasks such as symmetrical walking, squatting and negotiating stairs.   Baseline/Current: NEARLY COMPLETE.  Patient has nearly normal right hip strength with residual weakness into extension (see manual muscle testing).      Goal #4: Patient will be able to lift at least 75 lb from floor to waist with good lifting mechanics in order to demonstrate good core and trunk strength.   Baseline/Current: PARTIALLY COMPLETE.  Patient reports that he can lift a case of water (up to 40-50 lb) without a problem.    Goal #5: Patient will report improvement in symptoms while sitting and ability to sit up to 1 hour with minimal symptoms.  Baseline/Current: PARTIALLY COMPLETE.  Patient reports that he now has periods when he can sit without a problem, but when he flares up he has difficulty sitting > 15 minutes.    Goal #6: Patient will demonstrate improved posture without cuing for pain </= 2/10.  Baseline/Current: IN PROGRESS.  Patient stands and sits in lordotic position.    Goal #7: Patient will be able to hold a front plank for >/= 1 minute to demonstrate improved core strength.  Baseline/Current: IN PROGRESS.  Patient can hold a front plank for ~ 20 seconds with some difficulty.    Plan    Therapy options: will be seen for skilled physical therapy services    Planned therapy interventions: 20560, 20561-Dry Needling 1-2, 3+ areas, 97010-Cold Packs/Hot Packs, 97012-Mechanical Traction, 97032, G0283-Electrical Stimulation (unattended, attended), 97110-Therapeutic Exercises, 97112-Neuromuscular Re-education, 97116-Gait Training, 97140-Manual Therapy, 97164-Re-evaluation (PT) and 97530-Therapeutic Activities      Frequency: 1x week    Duration in weeks: 8    Education provided to: patient.    Education provided: Surveyor, minerals, HEP and Treatment options and plan    Education results: verbalized good understanding, needs reinforcement, demonstrates understanding and needs further instruction.    Communication/Consultation: N/A.    Next visit plan:        Progress core strengthening, especially plank. Manual therapy/TDN as needed to calm left hip external rotators.      Total Session Time: 35    Treatment rendered today:      Manual Therapy: 35 minutes  - Soft Tissue Mobilization left hip external rotators  - Hip External Rotator PIR MET  - PROM Hip ER  - PT Assisted HSS With AROM Ankle DF  [facility]  was informed of the risks associated with dry needling and he signed consent form.  Patient screened for any contraindications to needling including pregnancy, infection, antibiotic use, anticoagulants use, autoimmune issues, uncontrolled diabetes, and active cancer/treatment and does not have any of these conditions.  DRY NEEDLING FOR RELEASING IDENTIFIED TRIGGER POINTS TO FACILITATE MANUAL THERAPY TECHNIQUES AND ALLOW MUSCLE TO RETURN TO OPTIMAL LENGTH (NO CHARGE).  Left piriformis trigger point with 60mm x .30mm with Flat Palpation/Direct Approach established for safety - Local Twitch Response produced     Right upper trapezius trigger point with 50mm x .30mm with Pinch/Pull Technique established for safety - Local Twitch Response produced    Plan details: Access Code: [email or web address]  URL: [email or web address]  Date: 12/29/2023  Prepared by: Karena Harbour    Exercises  - Supine Posterior Pelvic Tilt  - 1 x daily - 7 x weekly - 1 sets - 20 reps - 5 hold  - Sidelying Thoracolumbar Mobilisation with Towel Roll  - 2 x daily - 7 x weekly - 1 sets - 1 reps - 5 minutes hold  - Clamshell with Resistance (Mirrored)  - 1 x daily - 7 x weekly - 2 sets - 10 reps - 3 hold  - Sidelying Hip Abduction (Mirrored)  - 1 x daily - 7 x weekly - 2 sets - 10 reps  - Hooklying Transversus Abdominis Palpation  - 1 x daily - 7 x weekly - 1 sets - 15 reps - 5 hold  - Supine March  - 1 x daily - 7 x weekly - 2 sets - 10 reps  - Supine 90/90 Alternating Toe Touch  - 1 x daily - 7 x weekly - 2 sets - 10 reps  - Full Plank  - 1 x daily - 7 x weekly - 1 sets - 3 reps - 30-45 hold  - Supine Bridge with Leg Extension  - 1 x daily - 7 x weekly - 2 sets - 10 reps - 5 hold  - Bird Dog  - 1 x daily - 7 x weekly -  2 sets - 10 reps  - Supine Dead Bug with Leg Extension  - 1 x daily - 7 x weekly - 2 sets - 10 reps  - Quadruped Hip Abduction with Resistance Loop  - 1 x daily - 7 x weekly - 2 sets - 10 reps  - Sternocleidomastoid Stretch  - 1 x daily - 7 x weekly - 1 sets - 3 reps - 30 hold  - Supine Piriformis Stretch with Foot on Ground  - 1 x daily - 7 x weekly - 3 sets - 10 reps  - Supine Bilateral Isometric ER With Mobilization Belt  - 1 x daily - 7 x weekly - 15 sets - 10 reps - 5 hold  - Supine Bilateral Isometric Hip Abduction With Mobilization Belt  - 1 x daily - 7 x weekly - 3 sets - 10 reps  - Supine Figure 4 Piriformis Stretch  - 1 x daily - 7 x weekly - 1 sets - 10 reps - 10 hold  - Supine LE Neural Mobilization  - 1 x daily - 7 x weekly - 2 sets - 10 reps  - Supine Gluteal Sets  - 1 x daily - 7 x weekly - 1 sets - 20 reps - 5 hold  - Unilateral Isometric Glute Activation Bent Knee - Left  - 1 x daily - 7 x weekly - 3 sets - 10 reps      Subjective:     History of Present Condition     Date of onset:  08/23/2016    Date is approximation?: yes    Explanation:  Patient reports onset of symptoms in 2018.    History of Present Condition/Chief Complaint:  Mr. Blaney is a 43 y.o. right hand dominant adult (male to male transition in process) who presents to outpatient physical therapy with chronic low back pressure, tightness and pain that began in 2018.  Patient cannot remember any specific traumatic mechanism of injury that may have been the result of his symptoms, but he does report that he broke his right leg 2x when he was younger.  The first one was when he was a baby.  The second time he broke it falling off the couch when he was 8.  He can't remember if it was his femur or tibia and he hasn't had a recent x-ray.  Subjective:  Mr. Knee reports that he went to a Sports Med doctor, who gave him an injection in his piriformis.  He has been in pain since the injection and it's radiating down the back of his leg all the way to the tip of his foot.  Pain:     Current pain rating:  8  Location:  Left posterior hip radiating down the back of her leg    Quality:  Aching    Progression:  Improved      Objective:     Static Posture Assessment   Lumbar Spine-Increased lordosis.     Palpation   Left Hip   Hypertonic in the piriformis.  Tenderness of the piriformis.                                  I attest that I have reviewed the above information.  Signed: Karena JINNY Harbour, PT  12/29/2023 11:08 AM

## 2023-12-29 NOTE — Unmapped (Addendum)
 [facility]  Established Patient Clinic Note    Assessment/Plan:   Vincent Waters is a 43 y.o.adult who presents for follow-up.    Problem List Items Addressed This Visit       On pre-exposure prophylaxis for HIV    Relevant Medications    cabotegravir  (APRETUDE ) 600 mg/3 mL extended-release injection     Other Visit Diagnoses         Chronic pain of left ankle    -  Primary      Gluteal pain              - Current meds: Current Medications[1]  Assessment & Plan  # Left-sided gluteal pain with sciatica  Pain radiating to the ankle likely from muscle spasms and sciatica. Steroid injection and dry needling provided partial relief. Compensatory pressure on left ankle noted. Full effect of steroid expected in days to weeks.  - Monitor effects of steroid injection and dry needling for two weeks.  - Continue physical therapy to prevent muscle activation and reduce inflammation.  - Avoid cyclobenzaprine  due to ineffectiveness and sedation.    # Left ankle swelling  Persistent mild swelling of dorsal aspect of L ankle with associated deep discomfort, not reproducible on palpation, dorsiflexion, plantarflexion, pronation or supination. DP pulse palpable. No overlying skin changes. Pt's report of paint popping sounds suggest scar tissue, possibly from prior sprain or other injury. Suspect compensatory pressure from altered gait due to gluteal pain and sciatica. No calf TTP or other signs to suggest DVT. After discussion of eval/management options, pt would like to proceed with XR of L ankle, and will otherwise continue with PT. Return precautions provided, pt verbalizes understanding and has no further questions or concerns at this time.   - Order X-ray of left ankle.  - Continue physical therapy    # Pre-exposure prophylaxis (PrEP) against HIV  Patient is sexually active transgender male with male and male partners. Recently entered a sexual relationship with a cis male who has HIV with undetectable viral load. No known exposure to HIV. Known exposure to other STIs: no. IV drug use: no. Condom use: inconsistent. Denies fever, fatigue, skin rash, pharyngitis, cervical lymphadenopathy, or any other symptoms concerning for acute HIV. Denies throat pain, urethral discharge, or any other symptoms concerning for acute GC/CT infection. HEENT exam without pharyngeal erythema, tonsillar edema or exudate, palpably enlarged LN, or rash on exposed skin. Based on this information, they are at high risk for contracting HIV and are a good candidate for pre-exposure prophylaxis against HIV. After discussion of risks and benefits, pt would like to switch from Descovy  to Apertude as he would prefer injections to PO for maximal adherence. Pt has no further questions or concerns.  This is a preventive medicine service and E/M modifier 33 should apply to all testing and prescriptions. The lab tests listed below are required to ensure the safety of continuing pre-exposure prophylaxis (PrEP) against HIV.  - Labs up to date, no repeat needed today  - Repeat monitoring labs in 3 months from prior  - Provided adherence and risk reduction / safer sex counseling  - Discontinue Descovy  order (will continue until he starts Apertude)  - Apertude ordered, to be administered in clinic following approval or prior auth    HEALTH MAINTENANCE ITEMS STILL DUE:  There are no preventive care reminders to display for this patient.  Follow-up: No follow-ups on file.    Future Appointments   Date Time  Provider Department Center   01/01/2024  8:00 AM Caid, Comer PARAS, MD UNCALLERGET [facility]   01/01/2024  9:20 AM MEDDDN MOTILITY NURSE UNCGIMEDET [facility]   01/02/2024  8:00 AM Debrah Morene Cadet, MD UNCFMFAYS [facility]   01/03/2024 11:30 AM Santo Karena Pac, PT PTOT [facility]   01/03/2024  1:30 PM Melvenia Child [id number] [facility]   01/04/2024  8:45 AM Kimple, Adam [place], MD UNCOTOMEWVIL [facility]   01/04/2024 11:20 AM Debrah Morene Cadet, MD UNCFMFAYS [facility]   01/05/2024  8:00 AM Christell, Prentice Cadet, MD ANESPAINMRKT [facility]   01/10/2024 11:30 AM Santo Karena Pac, PT PTOT [facility]   01/15/2024  8:30 AM Stem, Dorn Sharper, MD SURGI1UMH [facility]   01/16/2024  8:00 AM Caro Concha Bruckner, MD CARDSTHPOINT [facility]   01/17/2024 11:30 AM Santo Karena Pac, PT PTOT [facility]   01/24/2024  1:00 PM Celena Rozelle BROCKS, MD [facility]   02/01/2024  9:00 AM Arne Lavanda Bang, PMHNP OPTCVilcom [facility]   02/05/2024  9:00 AM UNCW FLUORO RM 8 IFLUOUW [place]   02/05/2024 10:00 AM [facility] MRI RM 1 [facility] [place]   02/12/2024 12:35 PM Argentina Geofm CROME, FNP [facility]   02/20/2024 10:45 AM Lia Ee, FNP [facility]   02/29/2024 10:30 AM Ave Bosworth, MD HVVSURMMNT [facility]   05/03/2024 12:30 PM Sonna Gee, MD UNCGIMEDET [facility]       Subjective   Vincent Waters is a 43 y.o. adult  coming to clinic today for follow-up.    No chief complaint on file.    History of Present Illness  Vincent Waters is a 43 year old male who presents for follow-up.     He has persistent swelling in his left ankle that has not resolved despite physical therapy. The ankle appears 'puffy' with a deep mild aching/itching sensation. Two days ago, he received a steroid injection in the L gluteal region for muscle spasms, and today he underwent dry needling therapy of the same area, which he states provided some relief by loosening the muscle. He experiences aching pain radiating from the buttock down to the ankle. No recent trauma or rolling of the ankle is reported, but he mentions frequent popping sounds in the ankle, similar to 'cracking knuckles.' He has been using Flexeril  as prescribed, but it has not alleviated the muscle spasms and causes significant drowsiness. He denies any pain upon squeezing the calf area. His grandmother suggested he might have rolled the ankle, but he is unsure. No imaging studies like an X-ray have been performed to evaluate the ankle further. Sitting can sometimes exacerbate the pain, causing it to radiate from the buttock to the ankle.    I have reviewed the problem list, medications, and allergies and have updated/reconciled them if needed.    Vincent Waters  reports that he has never smoked. He has never been exposed to tobacco smoke. He has never used smokeless tobacco.  Health Maintenance   Topic Date Due    Influenza Vaccine (1) 02/19/2024    DTaP/Tdap/Td Vaccines (2 - Td or Tdap) 08/19/2027    Lipid Screening  11/01/2028    Pneumococcal Vaccine 0-49  Completed    Hepatitis C Screen  Completed    COVID-19 Vaccine  Completed       Objective     VITALS: BP 129/73 (BP Site: R Arm, BP Position: Sitting, BP Cuff Size: Medium)  - Pulse 77  - Temp  36.8 ??C (98.2 ??F) (Oral)  - Ht 162.6 cm (5' 4)  - Wt 69.4 kg (153 lb)  - LMP 07/20/2017 (Exact Date)  - BMI 26.26 kg/m??     Physical Exam  General: well-appearing, sitting upright in no acute distress  Head: Normocephalic, atraumatic  ENT: No dental trauma noted.   Eyes: conjunctiva normal, non-erythematous, non-icteric, no discharge.  Neck: no thyroid enlargement or masses  Lungs: No increased work of breathing or audible wheezing  Ext: Mild swelling of dorsal aspect of L ankle, no pain on palpation, dorsiflexion, plantarflexion, pronation or supination. DP pulse palpable. No overlying skin changes.   Skin: Warm, dry, no erythema or rash on exposed skin  Musculoskeletal: No visible gait abnormalities  Neurologic: Alert & oriented x 3, no gross sensorimotor abnormalities  Psychiatric: Pleasant, cooperative, good eye contact, appropriate thought processes    Wt Readings from Last 3 Encounters:   12/29/23 69.4 kg (153 lb)   12/28/23 69.4 kg (153 lb)   12/25/23 68.9 kg (152 lb)      PHQ-9 PHQ-9 Total Score   10/22/2020   9:00 AM 6    09/04/2020   9:00 AM 3    08/21/2020   9:00 AM 5    05/22/2020   3:00 PM 9        Data saved with a previous flowsheet row definition     LABS/IMAGING: I have reviewed pertinent recent labs and imaging in Epic.    I personally spent a total of 30 minutes taking care of this patient including time with the patient, pre-visit planning, documentation, reviewing labs, coordinating care, and reviewing documentation from other providers in [facility] and in other specialties.  This was all done on the day of the visit.  My (and this clinic's) longitudinal relationship with this patient was instrumental in caring for this patient today including caring for their chronic medical and psychiatric conditions.      Odis Aho, MD, MPH (he/him)  [facility]  [address]   [place], [place] [address]  Phone: [phone]  Fax: [phone]         [1]   Current Outpatient Medications:     albuterol  2.5 mg /3 mL (0.083 %) nebulizer solution, Inhale 3 mL (2.5 mg total) by nebulization every six (6) hours as needed for wheezing or shortness of breath., Disp: 90 mL, Rfl: 1    azelastine  (ASTELIN ) 137 mcg (0.1 %) nasal spray, Instill 2 sprays into each nostril two (2) times a day as needed for rhinitis., Disp: 30 mL, Rfl: 1    budesonide -formoterol  (SYMBICORT ) 160-4.5 mcg/actuation inhaler, Inhale 1 puff by mouth Two (2) times a day., Disp: 10.2 g, Rfl: 5    cabotegravir  (APRETUDE ) 600 mg/3 mL extended-release injection, Inject 3 mL (600 mg total) into the muscle every thirty (30) days for 60 days, THEN 3 mL (600 mg total) every 8 weeks., Disp: 3 mL, Rfl: 11    cetirizine  (ZYRTEC ) 10 MG tablet, Take 1 tablet (10 mg total) by mouth daily., Disp: 30 tablet, Rfl: 12    clonazePAM  (KLONOPIN ) 0.5 MG tablet, Take 0.5 tablets (0.25 mg total) by mouth two (2) times a day as needed for anxiety., Disp: 30 tablet, Rfl: 5    cyclobenzaprine  (FLEXERIL ) 5 MG tablet, Take 1 tablet (5 mg total) by mouth Three (3) times a day as needed for muscle spasms., Disp: 15 tablet, Rfl: 1    dextroamphetamine -amphetamine  (ADDERALL) 20 mg tablet, Take  1 tablet (20 mg total) by mouth two (2) times a day., Disp: 60 tablet, Rfl: 0    [START ON 01/08/2024] dextroamphetamine -amphetamine  (ADDERALL) 20 mg tablet, Take 1 tablet (20 mg total) by mouth two (2) times a day., Disp: 60 tablet, Rfl: 0    diclofenac  sodium (VOLTAREN ) 1 % gel, Apply 2 g topically four (4) times a day., Disp: 100 g, Rfl: 0    EPINEPHrine  (EPIPEN ) 0.3 mg/0.3 mL injection, Inject 0.3 mL (0.3 mg total) under the skin once for 1 dose., Disp: 2 each, Rfl: 0    gabapentin  (NEURONTIN ) 250 mg/5 mL oral solution, Take 12 mL (600 mg total) by mouth every morning AND 18 mL (900 mg total) nightly., Disp: 1080 mL, Rfl: 1    inhalational spacing device Spcr, Use as directed with albuterol  and symbicort , Disp: 1 each, Rfl: 1    ipratropium (ATROVENT ) 42 mcg (0.06 %) nasal spray, 1-2 sprays in each nostril as needed for drainage, up to 4 time daily., Disp: 15 mL, Rfl: 2    metoclopramide  (REGLAN ) 10 MG tablet, Take 1/2 tablet (5 mg total) by mouth Three (3) times a day with a meal., Disp: 135 tablet, Rfl: 3    nebulizer and compressor (COMP-AIR NEBULIZER COMPRESSOR) Devi, 1 each by Miscellaneous route nightly as needed., Disp: 1 each, Rfl: 0    needle, disp, 25 Saketh (BD REGULAR BEVEL NEEDLES) 25 Ernan x 5/8 Ndle, For subcutaneous hormone injection., Disp: 25 each, Rfl: 0    omalizumab  (XOLAIR ) 300 mg/2 mL auto-injector, Inject the contents of 1 pen (300 mg) under the skin every fourteen (14) days., Disp: 4 mL, Rfl: 11    omalizumab  75 mg/0.5 mL AtIn, Inject the contents of 1 pen (75 mg) under the skin every fourteen (14) days., Disp: 1 mL, Rfl: 11    pantoprazole  (PROTONIX ) 40 MG tablet, Take 1 tablet (40 mg total) by mouth daily., Disp: 90 tablet, Rfl: 3    prucalopride (MOTEGRITY ) 2 mg Tab, Take 1 tablet (2 mg total) by mouth daily., Disp: 90 tablet, Rfl: 3    safety needles (BD ECLIPSE) 25 Adebayo x 1 Ndle, use for testosterone , Disp: 12 each, Rfl: 0    safety needles (BD SAFETYGLIDE NEEDLE) 18 Sakib x 1 1/2 Ndle, For drawing hormone injection, Disp: 25 each, Rfl: 0    syringe, disposable, (EASY TOUCH LUER LOCK SYRINGE) 1 mL Syrg, Use for weekly hormone injection., Disp: 4 each, Rfl: 0    syringe, disposable, (EASY TOUCH LUER LOCK SYRINGE) 1 mL Syrg, Use for testosterone , Disp: 12 each, Rfl: 0    syringe, disposable, 2.5 mL Syrg, Use weekly, Disp: 30 Syringe, Rfl: 2    testosterone  cypionate (DEPOTESTOTERONE CYPIONATE) 200 mg/mL injection, Inject 0.2 mL (40 mg total) into the muscle once a week., Disp: 12 mL, Rfl: 0    triamcinolone  (KENALOG ) 0.1 % cream, Apply topically Two (2) times a day., Disp: 15 g, Rfl: 0    verapamil  (CALAN ) 40 MG tablet, Take 1 tablet (40 mg total) by mouth nightly for 14 days, THEN 1 tablet (40 mg total) two (2) times a day for 28 days., Disp: 60 tablet, Rfl: 2    Current Facility-Administered Medications:     omalizumab  (XOLAIR ) injection 300 mg, 300 mg, Subcutaneous, Q28 Days, [name], [name], [name], 300 mg at 05/24/22 1138

## 2024-01-01 ENCOUNTER — Ambulatory Visit: Admit: 2024-01-01 | Payer: BLUE CROSS/BLUE SHIELD

## 2024-01-01 DIAGNOSIS — J3089 Other allergic rhinitis: Principal | ICD-10-CM

## 2024-01-01 DIAGNOSIS — L508 Other urticaria: Principal | ICD-10-CM

## 2024-01-01 DIAGNOSIS — J4550 Severe persistent asthma, uncomplicated: Principal | ICD-10-CM

## 2024-01-01 MED ORDER — OMALIZUMAB 75 MG/0.5 ML SUBCUTANEOUS AUTO-INJECTOR
SUBCUTANEOUS | 11 refills | 28.00000 days | Status: CP
Start: 2024-01-01 — End: ?

## 2024-01-01 MED ORDER — OMALIZUMAB 300 MG/2 ML SUBCUTANEOUS AUTO-INJECTOR
SUBCUTANEOUS | 11 refills | 28.00000 days | Status: CP
Start: 2024-01-01 — End: ?
  Filled 2024-01-09: qty 4, 28d supply, fill #0

## 2024-01-01 MED ORDER — POLYETHYLENE GLYCOL 3350 17 GRAM/DOSE ORAL POWDER
ORAL | 0 refills | 0.00000 days | Status: CP
Start: 2024-01-01 — End: ?

## 2024-01-01 MED ORDER — BUDESONIDE-FORMOTEROL HFA 160 MCG-4.5 MCG/ACTUATION AEROSOL INHALER
Freq: Two times a day (BID) | RESPIRATORY_TRACT | 5 refills | 61.00000 days | Status: CP
Start: 2024-01-01 — End: 2024-12-31

## 2024-01-01 MED ORDER — BISACODYL 5 MG TABLET,DELAYED RELEASE
ORAL_TABLET | ORAL | 0 refills | 0.00000 days | Status: CP
Start: 2024-01-01 — End: ?

## 2024-01-01 MED ORDER — AEROCHAMBER MV SPACER
Freq: Once | 0 refills | 1.00000 days | Status: CP
Start: 2024-01-01 — End: 2024-01-02

## 2024-01-01 MED ORDER — INHALATIONAL SPACING DEVICE
1 refills | 30.00000 days | Status: CN
Start: 2024-01-01 — End: ?

## 2024-01-01 MED ORDER — PEG 3350-ELECTROLYTES 236 GRAM-22.74 GRAM-6.74 GRAM-5.86 GRAM SOLUTION
Freq: Once | ORAL | 0 refills | 1.00000 days | Status: CP
Start: 2024-01-01 — End: 2024-01-02

## 2024-01-01 NOTE — Unmapped (Addendum)
-   Continue 375mg  Omalizumab  every 2 weeks  - Continue budesonide  sinus rinses as prescribed by ENT  - You can use 1-2 sprays of Astelin  1-2 times per day as needed for sinus symptoms  - Use Symbicort  2 puffs as needed when using your albuterol  nebulizer  - Continue albuterol  as needed. If you are consistently needing this more than twice per week, please let us  know.  - Continue daily oral antihistamine. You can get allegra , zyrtec , or xyzal over the counter. Take an additional dose before going outside in warm seasons.         Correct Use of MDI and Spacer with Mouthpiece    Below are the steps for the correct use of a metered dose inhaler (MDI) and spacer with MOUTHPIECE.  Patient should perform the following steps:  1.  Shake the canister for 5 seconds.  2.  Prime the MDI. (Varies depending on MDI brand, see package insert.) In general:  -If MDI not used in 2 weeks or has been dropped: spray 2 puffs into air  -If MDI never used before spray 3 puffs into air  3.  Insert the MDI into the spacer.  4.  Place the spacer mouthpiece into your mouth between the teeth.  5.  Close your lips around the mouthpiece and exhale normally.  6.  Press down the top of the canister to release 1 puff of medicine.  7.  Inhale the medicine through the mouth deeply and slowly (3-5 seconds spacer whistles when breathing in too fast.   8.  Hold your breath for 10 seconds and remove the spacer from your mouth before exhaling.  9.  Wait one minute before giving another puff of the medication.  10.Caregiver supervises and advises in the process of medicatin administration with spacer.              11.Repeat steps 4 through 8 depending on how many puffs are indicated on the prescription.  Cleaning Instructions   1. Remove the rubber end of spacer where the MDI fits.   2. Rotate spacer mouthpiece counter-clockwise and lift up to remove.   3. Lift the valve off the clear posts at the end of the chamber.   4. Soak the parts in warm water with clear, liquid detergent for about 15 minutes.   5. Rinse in clean water and shake to remove excess water.   6. Allow all parts to air dry. DO NOT dry with a towel.    7. To reassemble, hold chamber upright and place valve over clear posts. Replace spacer mouthpiece and turn it clockwise until it locks into place. Replace the back rubber end onto the spacer.       For more information, go to http://bit.ly/[employer]st[employer]Education.

## 2024-01-01 NOTE — Unmapped (Signed)
 EGD  Procedure #1   Colonoscopy  Procedure #2   [id number]  MRN     Endoscopist     Is the patient's health insurance [employer], [employer], [place] [facility]), [employer] [place], [employer], or [employer]?     Urgent procedure     Are you pregnant?     Are you in the process of scheduling or awaiting results of a heart ultrasound, stress test, or catheterization to evaluate new or worsening chest pain, dizziness, or shortness of breath?     Do you take: Plavix (clopidogrel), Coumadin (warfarin), Lovenox  (enoxaparin ), Pradaxa (dabigatran), Effient (prasugrel), Xarelto (rivaroxaban), Eliquis (apixaban), Pletal (cilostazol), or Brilinta (ticagrelor)?          Did ordering provider indicate how long to hold this medication in the order comments?          Which of the above medications are you taking?          What is the name of the medical practice that manages this medication?          What is the name of the medical provider who manages this medication?     Do you have hemophilia, von Willebrand disease, or low platelets?     Do you have a pacemaker or implanted cardiac defibrillator?     Has a [facility] provider specified the location(s)?     Which location(s) did the [facility] provider specify?          [facility]          [place]          [facility]      Do you see a liver specialist for chronic liver disease?     Is the procedure indication for variceal screening?     Is procedure indication for variceal banding (this does NOT include variceal screening)?     Have you had a heart attack, stroke or heart stent placement within the past 6 months?     Month of event     Year of event (ONLY ENTER LAST 2 DIGITS)        5  Height (feet)   4  Height (inches)   153  Weight (pounds)   26.3  BMI          Did the ordering provider specify a bowel prep?          What bowel prep was specified?     Do you have an ostomy (bag on your stomach that collects your stool)?          Is it an ileostomy?          Is it a colostomy? Patient doesn't know.     Do you have chronic kidney disease?   TRUE  Do you have chronic constipation or have you had poor quality bowel preps for past colonoscopies?     Do you have Crohn's disease or ulcerative colitis?     Have you had weight loss surgery?          When you walk around your house or grocery store, do you have to stop and rest due to shortness of breath, chest pain, or light-headedness?     Do you ever use supplemental oxygen?     Have you been hospitalized for cirrhosis of the liver or heart failure in the last 12 months?     Have you been treated for mouth or throat cancer with radiation or surgery?  Have you been told that it is difficult for doctors to insert a breathing tube in you during anesthesia?     Have you had a heart or lung transplant?          Are you on dialysis?     Do you have cirrhosis of the liver?     Do you have myasthenia gravis?     Is the patient a prisoner?   ################# ## ###################################################################################################################   MRN:  [id number]   Anticoag Review  No   Nurse Triage  No   GI clinic consult  No   Procedure(s):  EGD     Colonoscopy   Endoscopist:  0   Urgent:  No   Prep:  Extended Bowel Prep-Prep Pool                  --------------------------- --- ----------------------------------------------------------------------------------------------------------------------------------------------------------------------------   [facility] Locations:  [facility]     [facility]      [place]        Requested Locations:              ################# ## ###################################################################################################################

## 2024-01-01 NOTE — Unmapped (Addendum)
 [facility]    Chief complaint: follow up of hives, asthma, allergic rhinitis    Assessment and Plan:     Vincent [facility] Yasmin) is a 43 y.o. transgender male with anxiety and migraine headaches who was seen for follow-up visit for allergies, asthma, and chronic urticaria.    Allergic rhinoconjunctivitis, sensitized to dust mite and mold:   Symptoms are reasonably controlled at this time. Continue below regimen:  - Eye symptoms: Pataday 1 drop per eye daily as needed  - Flonase  sensimist 1 squirts in each nostril BID.   - Budesonide  rinses as per ENT   - Astelin  1-2 spray twice a day for additional control  - Continue cetirizine  10mg      Chronic urticaria, well controlled on Xolair :    CSU is characterized by recurrent, fleeting episodes of urticaria, with or without angioedema, on most days of the week. In the majority of cases, no external cause can be identified. Urticaria are currently well controlled following increase in Xolair  for asthma.  - Continue oral antihistamine daily  - Discussed taking additional antihistamine dose prior to going outside to see if this improves pruritic lesions developing with heat and sun exposure.    Severe-persistent asthma, well controlled:   Asthma control has greatly improved following the increase in Xolair  to 375mg  Q14 days. We will continue at this dose. Spirometry was performed today and was normal without signs of obstruction.  - Continue Xolair  375mg  Q14 days  - Will transition to Symbicort  with SMART therapy. Use 2 puffs of Symbicort  160 as needed when using albuterol , up to 12 puffs in 24 hours.  - Use albuterol  PRN for rescue  - If Symbicort  use occurs >2x/week consistently, will start consistent BID therapy    Return in about 6 months (around 07/03/2024).    Total time spent on today's visit was over 35 minutes, including both face-to-face time and non face-to-face time.     The patient was discussed with Dr. Casilda who agrees with the assessment and plan.    Comer Penna, MD  [facility] Allergy/Immunology Fellow  ?     Subjective:     Vincent [facility] Yasmin) is a 43 y.o. transgender male with anxiety and migraine headaches who was seen for follow-up visit for allergies, asthma, and chronic urticaria.    Interval history:     Rhinitis:   Overall improving with Xolair  increase. Using budesonide  rinses through ENT.  Using Flonase . No longer using Astelin .  Mostly having post-nasal drip now.  Still working with ENT and sleep regarding OSA and trying to get Inspire.    Urticaria:  Overall well controlled. He is having breakthrough pruritus when going outside in the summer months. With pruritus will develop small papules that turn into pustules and rupture. He is taking 10mg  Zyrtec  daily but felt Xyzal was more effective when he was using it previously and wants to switch back to it.    Asthma:    Symptoms much improved since increase in Xolair  to 375mg  every 14 days. Using albuterol  nebulizer 1-2 times per week. He is frequently missing Symbicort  doses and can go a week without it. He does not have a spacer at home.    Symptoms: chest tightness, wheeze  No nocturnal cough, no oral steroids.  Triggered by allergies, cigarette smoke, regurgitation. Uses nebulizer to help break up phlegm.   Current medications: Symbicort  doing 2 puffs twice per day. 3-4 times per week for rescue.   He  does not think Symbicort  works well for him in emergent situations and prefers albuterol .      ROS: Pertinent positive and negatives included in the above HPI. All other systems reviewed are negative.    Past Medical/Surgical/Allergy/Immunization/Social/Family History:  Reviewed and updated in Epic since last visit on 09/2018.     Current Outpatient Medications   Medication Sig Dispense Refill    albuterol  2.5 mg /3 mL (0.083 %) nebulizer solution Inhale 3 mL (2.5 mg total) by nebulization every six (6) hours as needed for wheezing or shortness of breath. 90 mL 1 azelastine  (ASTELIN ) 137 mcg (0.1 %) nasal spray Instill 2 sprays into each nostril two (2) times a day as needed for rhinitis. 30 mL 1    budesonide -formoterol  (SYMBICORT ) 160-4.5 mcg/actuation inhaler Inhale 1 puff by mouth Two (2) times a day. 10.2 g 5    cabotegravir  (APRETUDE ) 600 mg/3 mL extended-release injection Inject 3 mL (600 mg total) into the muscle every thirty (30) days for 60 days, THEN 3 mL (600 mg total) every 8 weeks. 3 mL 11    cetirizine  (ZYRTEC ) 10 MG tablet Take 1 tablet (10 mg total) by mouth daily. 30 tablet 12    clonazePAM  (KLONOPIN ) 0.5 MG tablet Take 0.5 tablets (0.25 mg total) by mouth two (2) times a day as needed for anxiety. 30 tablet 5    cyclobenzaprine  (FLEXERIL ) 5 MG tablet Take 1 tablet (5 mg total) by mouth Three (3) times a day as needed for muscle spasms. 15 tablet 1    dextroamphetamine -amphetamine  (ADDERALL) 20 mg tablet Take 1 tablet (20 mg total) by mouth two (2) times a day. 60 tablet 0    [START ON 01/08/2024] dextroamphetamine -amphetamine  (ADDERALL) 20 mg tablet Take 1 tablet (20 mg total) by mouth two (2) times a day. 60 tablet 0    diclofenac  sodium (VOLTAREN ) 1 % gel Apply 2 g topically four (4) times a day. 100 g 0    EPINEPHrine  (EPIPEN ) 0.3 mg/0.3 mL injection Inject 0.3 mL (0.3 mg total) under the skin once for 1 dose. 2 each 0    gabapentin  (NEURONTIN ) 250 mg/5 mL oral solution Take 12 mL (600 mg total) by mouth every morning AND 18 mL (900 mg total) nightly. 1080 mL 1    inhalational spacing device (AEROCHAMBER MV) Spcr 1 each by Miscellaneous route once for 1 dose. 1 each 0    inhalational spacing device Spcr Use as directed with albuterol  and symbicort  1 each 1    ipratropium (ATROVENT ) 42 mcg (0.06 %) nasal spray 1-2 sprays in each nostril as needed for drainage, up to 4 time daily. 15 mL 2    metoclopramide  (REGLAN ) 10 MG tablet Take 1/2 tablet (5 mg total) by mouth Three (3) times a day with a meal. 135 tablet 3    nebulizer and compressor (COMP-AIR NEBULIZER COMPRESSOR) Devi 1 each by Miscellaneous route nightly as needed. 1 each 0    needle, disp, 25 Dishawn (BD REGULAR BEVEL NEEDLES) 25 Nehal x 5/8 Ndle For subcutaneous hormone injection. 25 each 0    omalizumab  (XOLAIR ) 300 mg/2 mL auto-injector Inject the contents of 1 pen (300 mg) under the skin every fourteen (14) days. 4 mL 11    omalizumab  75 mg/0.5 mL AtIn Inject the contents of 1 pen (75 mg) under the skin every fourteen (14) days. 1 mL 11    pantoprazole  (PROTONIX ) 40 MG tablet Take 1 tablet (40 mg total) by mouth daily. 90 tablet 3  prucalopride (MOTEGRITY ) 2 mg Tab Take 1 tablet (2 mg total) by mouth daily. 90 tablet 3    safety needles (BD ECLIPSE) 25 Olsen x 1 Ndle use for testosterone  12 each 0    safety needles (BD SAFETYGLIDE NEEDLE) 18 Ledell x 1 1/2 Ndle For drawing hormone injection 25 each 0    syringe, disposable, (EASY TOUCH LUER LOCK SYRINGE) 1 mL Syrg Use for weekly hormone injection. 4 each 0    syringe, disposable, (EASY TOUCH LUER LOCK SYRINGE) 1 mL Syrg Use for testosterone  12 each 0    syringe, disposable, 2.5 mL Syrg Use weekly 30 Syringe 2    testosterone  cypionate (DEPOTESTOTERONE CYPIONATE) 200 mg/mL injection Inject 0.2 mL (40 mg total) into the muscle once a week. 12 mL 0    triamcinolone  (KENALOG ) 0.1 % cream Apply topically Two (2) times a day. 15 g 0    verapamil  (CALAN ) 40 MG tablet Take 1 tablet (40 mg total) by mouth nightly for 14 days, THEN 1 tablet (40 mg total) two (2) times a day for 28 days. 60 tablet 2     Current Facility-Administered Medications   Medication Dose Route Frequency Provider Last Rate Last Admin    omalizumab  (XOLAIR ) injection 300 mg  300 mg Subcutaneous Q28 Days [name], [name], [name]   300 mg at 05/24/22 1138       Vincent Waters lives with his girlfriend in [place] . He works at [employer] but is currently on medical leave.    Objective:   General : No apparent distress. Awake, alert, well appearing.  HEENT: Normocephalic, atraumatic. Inferior turbinates pink and not edematous. No drainage or congestion.  Neck: Neck is symmetrical with trachea midline.  Eyes: PERRL. Eyelids and conjunctiva normal bilaterally.   Respiratory: Clear breath sounds bilaterally with no increased work of breathing.  Cardiovascular: No edema, no pallor, no cyanosis.  Abdomen: Non-distended.  Skin: Tattoos on bilateral arms.  Extremities: Normal range of motion observed. No peripheral edema.  Neuro: Mood and behavior appropriate for age.  Musculoskeletal: Symmetric and appropriate movements of extremities.    Spirometry      Spirometry Prior   to Bronchodilator Spirometry After   Bronchodilator % change   FVC (L) 2.63     FVC (% pred 80     FEV1 (L) 2.4     FEV1 (% pred) 88     FEV1/FVC  91     FEF25-75% (L/sec) 4.16     FEF25-75% (% pred) 145     Technique  Repeatable and appropriate for interpretation            Interpretation:   Normal spirometry with no evidence of obstruction. Agreed. [facility]      Laboratory testing reviewed and pertinent for the following:  Component      Latest Ref Rng & Units 07/16/2018 08/15/2018   Cocklebur IgE      <0.35 kUA/L <0.35    Cat dander IgE      <0.35 kUA/L <0.35    Cot[place]wood (White Poplar) Tree IgE      <0.35 kUA/L <0.35    Dog Dander IgE      <0.35 kUA/L <0.35    D. farinae IgE      <0.35 kUA/L 0.66 (H)    D. pteronyssinus IgE      <0.35 kUA/L 0.55 (H)    Bahia Grass IgE      <0.35 kUA/L <0.35    Johnson Grass IgE      <  0.35 kUA/L <0.35    Timothy Grass IgE      <0.35 kUA/L <0.35    Alternaria alternata IgE      <0.35 kUA/L 0.39 (H)    Candida Albicans IgE      <0.35 kUA/L 4.49 (H)    Cladosporium Herbarum IgE      <0.35 kUA/L <0.35    Epicoccum purpurascens IgE      <0.35 kUA/L <0.35    Fusarium Proliferatum IgE      <0.35 kUA/L <0.35    Aspergillus fumigatus IgE      <0.35 kUA/L <0.35    Mucor Racemosus IgE      <0.35 kUA/L <0.35    Aspergillus luxembourg IgE      <0.35 kUA/L <0.35    Mouse IgE      <0.35 kUA/L <0.35    P chrysogenum (P notatum) IgE      <0.35 kUA/L <0.35    Rhizopus Nigrans      <0.35 kUA/L <0.35    Trichophyton rubrum IgE      <0.35 kUA/L <0.35    Setomelanomma rostrata (H. halodes) IgE      <0.35 kUA/L <0.35    Mugwort IgE      <0.35 kUA/L <0.35    Pigweed, Common IgE      <0.35 kUA/L <0.35    English Plantain IgE      <0.35 kUA/L <0.35    Giant Ragweed IgE      <0.35 kUA/L <0.35    German Cockroach IgE      <0.35 kUA/L <0.35    Sheep Sorrel      <0.35 kUA/L <0.35    Ragweed, short (common) IgE      <0.35 kUA/L <0.35    White Ash Tree IgE      <0.35 kUA/L <0.35    Beech (American) tree IgE      <0.35 kUA/L <0.35    Birch (Common Silver) tree IgE      <0.35 kUA/L <0.35    Box Elder Tree IgE      <0.35 kUA/L <0.35    Elm Tree IgE      <0.35 kUA/L <0.35    Maple Leaf Sycamore      <0.35 kUA/L <0.35    White Oak Tree IgE      <0.35 kUA/L <0.35    Pecan Hickory IgE      <0.35 kUA/L <0.35    Walnut Tree IgE      <0.35 kUA/L <0.35    [place] Grass IgE      <0.35 kUA/L <0.35    Goosefoot (Lamb's Quarters) IgE      <0.35 kUA/L <0.35    Willow tree IgE      <0.35 kUA/L <0.35    WBC      4.5 - 11.0 10*9/L 5.1 7.7   RBC      4.00 - 5.20 10*12/L 5.74 (H) 5.98 (H)   HGB      12.0 - 16.0 g/dL 85.1 84.5   HCT      63.9 - 46.0 % 47.1 (H) 48.9 (H)   MCV      80.0 - 100.0 fL 82.1 81.8   MCH      26.0 - 34.0 pg 25.8 (L) 25.8 (L)   MCHC      31.0 - 37.0 g/dL 68.5 68.4   RDW      87.9 - 15.0 % 13.9 14.0   MPV  7.0 - 10.0 fL 6.4 (L) 7.9   Platelet      150 - 440 10*9/L 345 383   Neutrophils %      % 69.2 74.5   Lymphocytes %      % 22.3 18.6   Monocytes %      % 4.8 4.6   Eosinophils %      % 1.3 0.2   Basophils %      % 0.7 0.3   Absolute Neutrophils      2.0 - 7.5 10*9/L 3.6 5.7   Absolute Lymphocytes      1.5 - 5.0 10*9/L 1.1 (L) 1.4 (L)   Absolute Monocytes       0.2 - 0.8 10*9/L 0.2 0.4   Absolute Eosinophils      0.0 - 0.4 10*9/L 0.1 0.0   Absolute Basophils       0.0 - 0.1 10*9/L 0.0 0.0   Large Unstained Cells      0 - 4 % 2 2 Hypochromasia      Not Present  Slight (A)   CD3% (T Cells)      61 - 86 %OfLymphs 74    Absolute CD3 Count      915-3,400 Cells/uL 889 (L)    CD4% (T Helper)      34 - 58 %OfLymphs 44    Absolute CD4 Count      510-2,320 Cells/uL 529    CD8% T Suppressor      12 - 38 %OfLymphs 27    Absolute CD8 Count      180-1,520 Cells/uL 325    CD4:CD8 Ratio      0.9 - 4.8 1.6    CD19% (B Cells)      7 - 23 %OfLymphs 11    Absolute CD19 Count      105 - 920 Cells/uL 132    CD16/56% NK Cell      1 - 27 %OfLymphs 13    Absolute CD16/56      15-1,080 Cells/uL 156    Diphtheria IgG Av Value      IU/mL 0.95    Tetanus IgG Value      IU/mL >2.24    Diphtheria IgG Ab       Positive    Tetanus IgG Ab       Positive    Sed Rate      0 - 20 mm/h 4 1   IgM      35 - 290 mg/dL 877    Total IgG      399-8,299 mg/dL 8,727    IgE, Total      2-214 IU/mL IU/mL 420 (H)    IgA      40.0 - 400.0 mg/dL 776.0    CRP      <89.9 mg/L  <5.0

## 2024-01-02 MED FILL — GAVILYTE-G 236 GRAM-22.74 GRAM-6.74 GRAM-5.86 GRAM ORAL SOLUTION: ORAL | 1 days supply | Fill #0

## 2024-01-02 MED FILL — OPTICHAMBER DIAMOND VHC SPACER: 1 days supply | Qty: 1 | Fill #0

## 2024-01-02 MED FILL — CLONAZEPAM 0.5 MG TABLET: ORAL | 30 days supply | Qty: 30 | Fill #4

## 2024-01-02 MED FILL — SYMBICORT 160 MCG-4.5 MCG/ACTUATION HFA AEROSOL INHALER: RESPIRATORY_TRACT | 60 days supply | Qty: 10.2 | Fill #0

## 2024-01-03 NOTE — Unmapped (Signed)
 [facility] AT [facility]  OUTPATIENT PHYSICAL THERAPY  01/03/2024  Note Type: Treatment Note       Patient Name: Vincent Waters  Date of Birth:[birth year]  Diagnosis:   No diagnosis found.    Referring MD:  Debrah Morene Cadet,*     Date of Onset of Impairment-08/23/2016  Date PT Care Plan Established or Reviewed-12/06/2023  Date PT Treatment Started-08/24/2023   Visit Count: 16  Plan of Care Effective Date:          Assessment/Plan:    Assessment  Assessment details:    Jaycen is getting relief from manual and states he feels much better at the conclusion of his appointment.  His plank is slowly improving.  I suspect that, if we can increase his plank time to >/= 1 minute to improve his core activation, he will feel much better.  We will likely continue to keep his symptoms low with manual therapy in clinic for the next few visits while he progresses his core/trunk strength independently, with occasional progressions to his strength program in clinic.  He agreed with this plan.  All questions answered.  This patient requires skilled physical therapy services to address the outlined impairments in order to return to his desired level of function.      Impairments: pain, muscular restrictions, hypertonicity, core weakness, impaired ADLs, joint restriction, decreased strength and gait deviation                      Therapy Goals      Goals:      Goals: Updated 12/06/2023.   In 12 weeks:  Goal #1: Patient will be independent with progressively monitored home exercise program.   Baseline/Current: COMPLETE.   Patient is familiar with home exercise program and performs it consistently.     Goal #2: Patient will demonstrate lumbar ROM within normal limits without pain in order to perform daily functional activities such as bending to pick an object off the ground, twisting to reach into the patient's backseat or bend backwards in order to lift an object overhead.   Baseline/Current: COMPLETE.  Patient now has normal lumbar ROM with reports of tightness (see ROM testing).     Goal #3: Patient will demonstrate right hip strength within normal limits without pain in order to allow for functional tasks such as symmetrical walking, squatting and negotiating stairs.   Baseline/Current: NEARLY COMPLETE.  Patient has nearly normal right hip strength with residual weakness into extension (see manual muscle testing).      Goal #4: Patient will be able to lift at least 75 lb from floor to waist with good lifting mechanics in order to demonstrate good core and trunk strength.   Baseline/Current: PARTIALLY COMPLETE.  Patient reports that he can lift a case of water (up to 40-50 lb) without a problem.    Goal #5: Patient will report improvement in symptoms while sitting and ability to sit up to 1 hour with minimal symptoms.  Baseline/Current: PARTIALLY COMPLETE.  Patient reports that he now has periods when he can sit without a problem, but when he flares up he has difficulty sitting > 15 minutes.    Goal #6: Patient will demonstrate improved posture without cuing for pain </= 2/10.  Baseline/Current: IN PROGRESS.  Patient stands and sits in lordotic position.    Goal #7: Patient will be able to hold a front plank for >/= 1 minute to demonstrate improved core strength.  Baseline/Current:  IN PROGRESS.  Patient can hold a front plank for ~ 20 seconds with some difficulty.    Plan    Therapy options: will be seen for skilled physical therapy services    Planned therapy interventions: 20560, 20561-Dry Needling 1-2, 3+ areas, 97010-Cold Packs/Hot Packs, 97012-Mechanical Traction, 97032, G0283-Electrical Stimulation (unattended, attended), 97110-Therapeutic Exercises, 97112-Neuromuscular Re-education, 97116-Gait Training, 97140-Manual Therapy, 97164-Re-evaluation (PT) and 97530-Therapeutic Activities      Frequency: 1x week    Duration in weeks: 8    Education provided to: patient.    Education provided: Surveyor, minerals, HEP and Treatment options and plan    Education results: verbalized good understanding, needs reinforcement, demonstrates understanding and needs further instruction.    Communication/Consultation: N/A.    Next visit plan:        Progress core strengthening, especially plank. Manual therapy/TDN as needed to calm left hip external rotators.      Total Session Time: 30    Treatment rendered today:      Manual Therapy: 30 minutes  - Soft Tissue Mobilization left hip external rotators  - Hip External Rotator PIR MET  - PROM Hip ER  - PT Assisted HSS With AROM Ankle DF  [facility] ??was informed of the risks associated with dry needling and he signed consent form.?? Patient screened for any contraindications to needling including pregnancy, infection, antibiotic use, anticoagulants use, autoimmune issues, uncontrolled diabetes, and active cancer/treatment and does not have any of these conditions.  DRY NEEDLING FOR RELEASING IDENTIFIED TRIGGER POINTS TO FACILITATE MANUAL THERAPY TECHNIQUES AND ALLOW MUSCLE TO RETURN TO OPTIMAL LENGTH (NO CHARGE).  Left piriformis trigger point with 60mm x .30mm with Flat Palpation/Direct Approach established for safety - Local Twitch Response produced????   Right upper trapezius trigger point with 50mm x .30mm with Pinch/Pull Technique established for safety - Local Twitch Response produced    Plan details: Access Code: [email or web address]  URL: [email or web address]  Date: 12/29/2023  Prepared by: Karena Harbour    Exercises  - Supine Posterior Pelvic Tilt  - 1 x daily - 7 x weekly - 1 sets - 20 reps - 5 hold  - Sidelying Thoracolumbar Mobilisation with Towel Roll  - 2 x daily - 7 x weekly - 1 sets - 1 reps - 5 minutes hold  - Clamshell with Resistance (Mirrored)  - 1 x daily - 7 x weekly - 2 sets - 10 reps - 3 hold  - Sidelying Hip Abduction (Mirrored)  - 1 x daily - 7 x weekly - 2 sets - 10 reps  - Hooklying Transversus Abdominis Palpation  - 1 x daily - 7 x weekly - 1 sets - 15 reps - 5 hold  - Supine March  - 1 x daily - 7 x weekly - 2 sets - 10 reps  - Supine 90/90 Alternating Toe Touch  - 1 x daily - 7 x weekly - 2 sets - 10 reps  - Full Plank  - 1 x daily - 7 x weekly - 1 sets - 3 reps - 30-45 hold  - Supine Bridge with Leg Extension  - 1 x daily - 7 x weekly - 2 sets - 10 reps - 5 hold  - Bird Dog  - 1 x daily - 7 x weekly - 2 sets - 10 reps  - Supine Dead Bug with Leg Extension  - 1 x daily - 7 x weekly - 2 sets - 10 reps  - Quadruped Hip Abduction with Resistance  Loop  - 1 x daily - 7 x weekly - 2 sets - 10 reps  - Sternocleidomastoid Stretch  - 1 x daily - 7 x weekly - 1 sets - 3 reps - 30 hold  - Supine Piriformis Stretch with Foot on Ground  - 1 x daily - 7 x weekly - 3 sets - 10 reps  - Supine Bilateral Isometric ER With Mobilization Belt  - 1 x daily - 7 x weekly - 15 sets - 10 reps - 5 hold  - Supine Bilateral Isometric Hip Abduction With Mobilization Belt  - 1 x daily - 7 x weekly - 3 sets - 10 reps  - Supine Figure 4 Piriformis Stretch  - 1 x daily - 7 x weekly - 1 sets - 10 reps - 10 hold  - Supine LE Neural Mobilization  - 1 x daily - 7 x weekly - 2 sets - 10 reps  - Supine Gluteal Sets  - 1 x daily - 7 x weekly - 1 sets - 20 reps - 5 hold  - Unilateral Isometric Glute Activation Bent Knee - Left  - 1 x daily - 7 x weekly - 3 sets - 10 reps      Subjective:     History of Present Condition     Date of onset:  08/23/2016    Date is approximation?: yes    Explanation:  Patient reports onset of symptoms in 2018.    History of Present Condition/Chief Complaint:  Mr. Mallek is a 43 y.o. right hand dominant adult (male to male transition in process) who presents to outpatient physical therapy with chronic low back pressure, tightness and pain that began in 2018.  Patient cannot remember any specific traumatic mechanism of injury that may have been the result of his symptoms, but he does report that he broke his right leg 2x when he was younger.  The first one was when he was a baby.  The second time he broke it falling off the couch when he was 8.  He can't remember if it was his femur or tibia and he hasn't had a recent x-ray.  Subjective:  Mr. Raisanen reports that he is still hurting, but he has been stretching regularly and he has gotten his plank up to 30 seconds.  It still feels shaky, but he can do it.  He got about 3 days of relief after last appointment.  Pain:     Current pain rating:  6  Location:  Left posterior hip radiating down the back of her leg    Quality:  Aching    Progression:  Improved      Objective:     Static Posture Assessment   Lumbar Spine-Increased lordosis.     Palpation   Left Hip   Hypertonic in the piriformis.  Tenderness of the piriformis.                                  I attest that I have reviewed the above information.  Signed: Karena JINNY Harbour, PT  01/03/2024 11:34 AM

## 2024-01-03 NOTE — Unmapped (Signed)
 Left message on 01/03/24 for Vincent Waters regarding upcoming GI procedure on 01/09/24 at [facility] location at 1000 with arrival time of 0900. Detailed start date of low fiber diet and clear liquid diet/prep, medications to hold or adjust (if applicable) as well as needing to have driver who MUST SIGN DISCHARGE PAPERS and take them home. Reminded patient that prep instructions are in Mychart message dated 01/01/24 for reference. Reinforced that failure to follow the diet and prep instructions could result in procedure getting rescheduled. Left number [phone] for return call if there are any questions after reviewing instructions.

## 2024-01-05 ENCOUNTER — Ambulatory Visit
Admit: 2024-01-05 | Discharge: 2024-01-06 | Payer: BLUE CROSS/BLUE SHIELD | Attending: Anesthesiology | Primary: Anesthesiology

## 2024-01-05 DIAGNOSIS — G540 Brachial plexus disorders: Principal | ICD-10-CM

## 2024-01-05 DIAGNOSIS — G894 Chronic pain syndrome: Principal | ICD-10-CM

## 2024-01-05 DIAGNOSIS — M7918 Myalgia, other site: Principal | ICD-10-CM

## 2024-01-05 NOTE — Unmapped (Signed)
 Today we did the following :  -It was good to see you    -If we have prescribed you a medication, be prepared that your insurance company may require additional paperwork (even for generic medication), typically called a prior authorization.  This sometimes can delay the prescription, but know that our office is very efficient at taking care of these.  If it is significantly delayed, you may call the pharmacy for updates, or call the office.    -Continue gabapentin , 1 refill remaining, I sent another Rx for August.    -Continue PT    -Continue static stretching DAILY    -Look into adding more active movements like yoga and pilates.    -I think the biggest thing is to develop a 20-30 minute stretching/exercise routine for you to do everyday, we call this a Home Exercise Program (HEP). So I want you to spend the next few months building this up and making it part of your daily routine. I dont expect you to do 30 minutes a day 7 days a week to start, but build up on that over time.  Make sure you develop a routine that you enjoy, find helpful, and doesn't make you feel worse.  Low impact yoga, low impact pilates, and even tai chi can be a nice way to start.  I can provide some basic stretches and there are plenty of resources online as well (good searches, youtube videos).  If you find something online and have a question about it, feel free to send a mychart message with a link and I can review it.    Exercise   Exercise is one of the best things for pain  Begin exercising regularly. Start with 5 minutes a day two or three times a week (or more if you are already doing some) and gradually increase to 30-60 minutes a day around five times per week.   Increase your activity over the next 3-6 months  There may be small setbacks, if so take a small break or slow down, then start back up.  You should gage your activity by being slightly short of breath but not so much that you cannot speak in full sentences.  Incorporate daily stretching into any exercise routine. This can be as simple as 5 minutes of stretching after exercise or more advanced activities such as yoga.  Yoga has been shown to be as beneficial as Physical Therapy for back pain  If traditional yoga or pilates seems intimidating, look for low impact, wall, chair, geriatric (older patients), or even pregnancy options as they tend to be easier.      -It may be worth seeing if you can find someone that would inject botox into the trap muscles.  That may provide you longer relief.  That is not something we are able to do.  We have exhausted other procedures.    -Follow up in 2 months so we can see if there are other pelvic injections to do    -Please plan to arrive to all appointments at least 30 minutes prior to your scheduled appointment time. Failure to do so may delay your visit.    MyChart Messages:  Please use MyChart for non-urgent symptoms or matters such as general questions, non-urgent prescription refills, or non-urgent scheduling issues. For your safety and best  care please DO NOT use MyChart messages to report emergent or urgent symptoms as messages are only checked during regular business hours.  These messages are checked by  the nurses during normal business hours 8:30 am-4:30 pm Monday-Friday every 24-48 hours and are for non-urgent, non-emergent concerns. You may be asked to return for a follow up visit if it is deemed your questions are best handled in the clinic setting.  Please call our nurse line below for more time sensitive issues. If our office is closed you can contact the hospital operator to reach the on call provider available for urgent/emergent issues only.     -Because of the high volume of calls we receive and the high demand for our clinical services, we are occupied all day providing care for patients in the clinic. This leaves little time to respond to phone calls, and we are generally unable to discuss patient care advice over the telephone. If you are experiencing a medication side effect or complication, you can call and let us  know, but we will typically not make a medication substitution or change over the telephone.     We are generally unable to respond acutely to a flare up of pain, as this is quite common in our patients and needs to be dealt with as part of the long term management plan. Please make an appointment with us  if you wish to discuss a matter in any detail. Should you still need to call, please do so at [phone].       Thank you for choosing [facility]. It was a pleasure to see you in clinic today. Please contact us  with any questions or concerns at [phone].     Prentice Cost, MD    01/05/2024  Anesthesiologist and Pain Management Provider  [facility]    -If you or someone you know is having a mental health emergency, you can dial [phone] (instead of 911), for the mental health emergency line.      What are common side effects from pain medications you may be taking?    Do not drive or do anything with responsibility until you know you are tolerating a new medication.    Many pain type medications can be sedating, especially in combination with others.  They can also decrease your breathing especially in combination with other medications.  If a medication is not effective for you, talk to us  about weaning off (do not stop abruptly on your own), so we do not just continue it.    Only take medications as prescribed.  If a medication is prescribed as needed, it means you can use it as you need it and do not have to take it every day.  Do not ever take more than what is prescribed to you for any medication.  Not only could this be dangerous, but if you do not take it as prescribed we may have to discontinue it due to safety concerns.  If you feel like your medications are not controlling your medications, do not adjust the medication on your own.  Please discuss at your next clinic visit and we can determine a plan.    Opioids/Narcotics (Examples: oxycodone , hydrocodone, tramadol , morphine, hydromorphone , fentanyl  patch)  Narcan is now available Over the Counter (OTC), meaning you can pick it up at many pharmacies without a prescription.  The cost is around $45.    ?? Constipation  ?? Itching  ?? Nausea  ?? Dizziness or lightheadedness  ?? Drowsiness or sedation  ?? Decreased breathing   ?? Addiction     Anticonvulsants (Examples: Gabapentin , Pregabalin, Carbamazepine, topiramate )  New warning in 2019 that these  medications in combination with opioids can cause respiratory depression.  ?? Dizziness or lightheadedness  ?? Drowsiness or sedation  ?? Nausea  ?? Leg swelling     Antidepressants (Examples: duloxetine, amitriptyline , nortriptyline, venlafaxine)  ?? Dizziness or lightheadedness  ?? Drowsiness or sedation  ?? Constipation  ?? Urinary retention  ?? Dry mouth  ?? Nausea  ?? Difficulty falling asleep     NSAIDS (Examples: Ibuprofen , naproxen , meloxicam, celecoxib)  -The FDA put out guidelines in 2015 that NSAIDs should not be used regularly due to safety concerns.  OK to use a few times a month, or for 1-2 weeks for a flare of pain.  If you take NSAIDs everyday, we should discuss this and stop it due to safety.  ?? Bleeding  ?? Stomach ulcers  ?? Kidney problems   -Heart Attack  -Stroke     Muscle relaxants (Examples: cyclobenzaprine , tizanidine , baclofen )  ?? Dizziness or lightheadedness  ?? Drowsiness or sedation  ?? Nausea  ?? Low blood pressure  ?? Dry mouth     Many pain medications can lead to a rare, potentially dangerous condition called serotonin syndrome. Symptoms can include tremors, agitation, fever, muscle rigidity/contractions, diarrhea, excessive sweating, palpitations and high blood pressure. If these symptoms arise, please contact us  or report to the emergency room for further management.      Your pharmacist can answer any questions you may have in regard to all your medications or possible interactions.

## 2024-01-05 NOTE — Unmapped (Addendum)
 Department of Anesthesiology  [facility]  [address], [address]  [place], [place] [address]  [phone]    Chronic Pain Follow-Up Note  1. Myofascial pain    2. TOS (thoracic outlet syndrome)    3. Chronic pain syndrome        Assessment and Plan  Bric[facility] is a 43 y.o. being seen at the [facility] for evaluation of ongoing pain after TOS decompression/pectoralis minor release on the right for thoracic outlet syndrome by Dr. Ave in May 2023. He was first seen by our clinic in March 2023 for diagnostic injections. His range of motion has an increased postoperatively, but still has tight, spasming pain in the right neck and shoulder. He is currently weaning off benzodiazepines, discontinued opioid medications in late 2023 which he was chronically on for 3 years, and inquires about optimizing medications and procedures to improve his baseline pain at this time. His primary pain generator includes myofascial pain post-TOS decompression. He has other medical history that includes anxiety, scleroderma, migraines, autoimmune autonomic neuropathy, asthma. Also follows with sports medicine and has undergone injections there as well.  We have performed TPI and spinal accessory nerve block/pulsed RFA with benefit.     July 2025  Last seen in June, with worsening pain, however was noted patient had been inconsistent with gabapentin  compliance and had been taking it twice daily due to pill burden but at times had skipped doses altogether.  Since last month, Gabapentin  was increased to 600 mg in a.m. and 900 mg at night.  Patient states his TOS symptoms are stable and manageable if he is consistent on his gabapentin  and gets dry needling done with his PCP weekly.    Patient's main complaint today is his left-sided hip and leg pain. Pain had been bothersome for a couple years but now notably worse. Evaluated with sports medicine on 7/9 with pain radiating to ankle.  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  Notes tenderness over SI joint with positive FABER exam and also tenderness over greater trochanter.  Piriformis CSI and dry needling performed with sports med on 7/11 provided immediate partial relief, which was only temporary.  Appears to have targeted the correct area causing his pain, just didn't have sustained benefit.  Patient has tried muscle relaxers in the past but does not wish to resume them due to sedating effects. Discussed adding more active movements and stretches such as yoga and Pilates in addition to daily static stretching and continuing PT. Discussed with patient it is possible to resolve on it's own over time but may consider CSI of SI joint or GT in 2 months if no improvement.    Thoracic Outlet Syndrome - Myofascial pain - Chronic Pain Syndrome   Chronic, stable, ongoing  - Continue following with PT  - Continue gabapentin  600 mg in a.m. and 900 mg at night   - Continue clonazepam  taper with PCP, seems like wean has paused.  - Continue dry needling as needed with PCP  - Consider Botox injection into trap muscles with another provider (due to cost concerns)    Left-sided gluteal pain with radiation down leg (possible piriformis induced sciatica)  Hip and lumbar MRI done 10/2022 showed normal hip and lumbar spine.  Immediate temporary improvement with piriformis CSI (12/29/23).  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 FABER positive and point  tenderness over greater trochanter.   - Continue PT and HEP  - Continue daily static stretching exercises  - Start adding more active movements (yoga and Pilates)    Urine toxicology: 04/28/22  Urine toxicology screen was appropriate.   Last Opioid Change: N/A  Last EKG: N/A  Previous Compliance Issues: None  Naloxone ordered: no  Total morphine equivalents: 0  Benzodiazepine: Yes  [place] database was reviewed 01/05/24     Considerations for the future:  - Repeat TPIs PRN  - Continue follow up with Oromaxillofacial   - Consider initiation of Butrans/buprenorphine product after weaning off of benzodiazepines and pain psych evaluation for COM should conservative measures fail, but hoping to avoid if possible  - Consider up titration of gabapentin  oral solution or rotation to Lyrica   - SI joint CSI or GT CSI after 2 months if no improvement in hip pain (piriformis CSI 12/29/2023)  -Botox to trap and/or piriformis-would likely need to be another clinic due to cost    Return in about 2 months (around 03/07/2024) for MD or NP.    Requested Prescriptions     Signed Prescriptions Disp Refills    gabapentin  (NEURONTIN ) 250 mg/5 mL oral solution 1080 mL 1     Sig: Take 12 mL (600 mg total) by mouth every morning AND 18 mL (900 mg total) nightly.     No orders of the defined types were placed in this encounter.      HPI:  Mr. Henrichs is seen in consultation at the request of Dr. Ave for evaluation and recommendations regarding His pain.     The patient was last seen in June.  Patient describes two areas of chronic pain. First is in his right shoulder/neck pain related to his TOS that he feels is now manageable with weekly dry needling and his gabapentin  regimen of 600 mg in the a.m. and 900 mg at night.  His second source of pain involves his left hip and lower extremity.  He describes the left-sided pain starting in the gluteal area and radiating down to the foot, and sharp and aching and worsening with sitting. He also has associated ankle swelling and numbness of bottom of foot. Walking and standing provide some relief. He states this has been bothering him for multiple years however has become increasingly worse over the past couple months.  He had an MRI of his hip and lumbar spine last year which was unremarkable.  Patient has been working closely with his sports medicine physician, last visit 12/27/2023, and states he was told it is likely primarily piriformis muscle related and other multiple factors.  Patient received CSI piriformis muscle with PCP on 12/29/2023 with immediate relief.  Patient states he felt primary relief was from the lidocaine , which worked great however now feels that his pain is now back to before the injection.    Current Medications:  Gabapentin  600 mg AM, 900 mg PM  Clonazepam  0.5 mg every day PRN  Voltaren  gel    Patient states that the highest level of pain is reported as 10/10 in intensity, least pain level is 5/10, pain currently is 9/10,  and average pain level is 9/10.  Pain is localized to right neck and shoulder and left posterior hip and left leg. and is located    Pain is described as aching and dull  Pain is present: All of the time  Pain is improved with massage, injections, and standing  The Pain is worse during Mornings,  During the day, and Evenings  The patient reports that their pain negatively impacts: enjoyment of life, general activity, normal work, sleep, walking, and sitting  Changes to the patient's interval medical and social history are as follows: PCP visit and piriformis CSI  The patient reports  new pain.   The patients pain is worse  Is the patient currently on blood thinners or anti-coagulants? No    In regards to medications currently taken for pain management the patient is tolerating these medications well.  complains of associated side effects: dry mouth  Patient denies misuse, abuse or diversion of medications.   Patient reports being stable on this medication regimen and thinks that the medications do improve patient's quality of life and do improve patient's functionality level.   Patient reports that the patient is able to perform majority of ADLs on the current regimen.     Patient denies homicidal/suicidal ideation.     Review Of Systems  General none  Cardiovascular swelling of ankles/legs  Gastrointestinal diarrhea, constipation, heartburn, and gastric reflux  Skin none  Endocrine increased sweating  Musculoskeletal spasms/spacticity/cramps, back pain, muscle aches/weakness  Neurologic dizziness/vertigo  Psychiatric anxiety      Previous Medication Trials:  NSAIDS- ibuprofen   Antidepressants-   Anticonvulsant- gabapentin   Muscle relaxants- flexeril , baclofen   Topicals-voltaren  gel  Short-acting opiates-percocet   Long-acting opiates-   Anxiolytics-   Other-     Previous Interventions  Medications, PT, surgery, injections with sports med  TOS decompression/PMR 11/16/21  TPI-05/10/22-very helpful.  Have repeated several times since  PT-2023  Left GTB CSI-sports med 05/27/22  Spinal accessory nerve block-11/04/22  Spinal accessory nerve block and pulsed RFA-12/29/22- had significant relief, 07/27/23- no relief  Left piriformis CSI 12/27/23-sports med    Allergies as of 01/05/2024 - Reviewed 01/05/2024   Allergen Reaction Noted    Latex Rash and Hives 09/16/2016    Penicillins Anaphylaxis 09/16/2016    Shellfish containing products Anaphylaxis 01/23/2017    Trileptal [oxcarbazepine] Rash 07/19/2023      Current Outpatient Medications   Medication Sig Dispense Refill    azelastine  (ASTELIN ) 137 mcg (0.1 %) nasal spray Instill 2 sprays into each nostril two (2) times a day as needed for rhinitis. 30 mL 1    bisacodyl  (DULCOLAX) 5 mg EC tablet Take 2 tablets (10 mg total) by mouth Take as directed. Take 2 tablets (10 mg total) as directed for bowel prep. 2 tablet 0    budesonide -formoterol  (SYMBICORT ) 160-4.5 mcg/actuation inhaler Inhale 1 puff by mouth Two (2) times a day. 10.2 g 5    cabotegravir  (APRETUDE ) 600 mg/3 mL extended-release injection Inject 3 mL (600 mg total) into the muscle every thirty (30) days for 60 days, THEN 3 mL (600 mg total) every 8 weeks. 3 mL 11    cetirizine  (ZYRTEC ) 10 MG tablet Take 1 tablet (10 mg total) by mouth daily. 30 tablet 12    clonazePAM  (KLONOPIN ) 0.5 MG tablet Take 0.5 tablets (0.25 mg total) by mouth two (2) times a day as needed for anxiety. 30 tablet 5    cyclobenzaprine  (FLEXERIL ) 5 MG tablet Take 1 tablet (5 mg total) by mouth Three (3) times a day as needed for muscle spasms. 15 tablet 1    dextroamphetamine -amphetamine  (ADDERALL) 20 mg tablet Take 1 tablet (20 mg total) by mouth two (2) times a day. 60 tablet 0    [START ON 01/08/2024] dextroamphetamine -amphetamine  (ADDERALL) 20 mg tablet Take 1 tablet (20 mg total) by mouth two (2)  times a day. 60 tablet 0    diclofenac  sodium (VOLTAREN ) 1 % gel Apply 2 g topically four (4) times a day. 100 g 0    inhalational spacing device Spcr Use as directed with albuterol  and symbicort  1 each 1    ipratropium (ATROVENT ) 42 mcg (0.06 %) nasal spray 1-2 sprays in each nostril as needed for drainage, up to 4 time daily. 15 mL 2    metoclopramide  (REGLAN ) 10 MG tablet Take 1/2 tablet (5 mg total) by mouth Three (3) times a day with a meal. 135 tablet 3    nebulizer and compressor (COMP-AIR NEBULIZER COMPRESSOR) Devi 1 each by Miscellaneous route nightly as needed. 1 each 0    needle, disp, 25 Fordyce (BD REGULAR BEVEL NEEDLES) 25 Taariq x 5/8 Ndle For subcutaneous hormone injection. 25 each 0    omalizumab  (XOLAIR ) 300 mg/2 mL auto-injector Inject the contents of 1 pen (300 mg) under the skin every fourteen (14) days. 4 mL 11    omalizumab  75 mg/0.5 mL AtIn Inject the contents of 1 pen (75 mg) under the skin every fourteen (14) days. 1 mL 11    pantoprazole  (PROTONIX ) 40 MG tablet Take 1 tablet (40 mg total) by mouth daily. 90 tablet 3    polyethylene glycol (CLEARLAX) 17 gram/dose powder Take as directed for extended bowel prep. 238 g 0    prucalopride (MOTEGRITY ) 2 mg Tab Take 1 tablet (2 mg total) by mouth daily. 90 tablet 3    safety needles (BD ECLIPSE) 25 Delmus x 1 Ndle use for testosterone  12 each 0    safety needles (BD SAFETYGLIDE NEEDLE) 18 Lucky x 1 1/2 Ndle For drawing hormone injection 25 each 0    syringe, disposable, (EASY TOUCH LUER LOCK SYRINGE) 1 mL Syrg Use for weekly hormone injection. 4 each 0    syringe, disposable, (EASY TOUCH LUER LOCK SYRINGE) 1 mL Syrg Use for testosterone  12 each 0    syringe, disposable, 2.5 mL Syrg Use weekly 30 Syringe 2    testosterone  cypionate (DEPOTESTOTERONE CYPIONATE) 200 mg/mL injection Inject 0.2 mL (40 mg total) into the muscle once a week. 12 mL 0    triamcinolone  (KENALOG ) 0.1 % cream Apply topically Two (2) times a day. 15 g 0    albuterol  2.5 mg /3 mL (0.083 %) nebulizer solution Inhale 3 mL (2.5 mg total) by nebulization every six (6) hours as needed for wheezing or shortness of breath. 90 mL 1    EPINEPHrine  (EPIPEN ) 0.3 mg/0.3 mL injection Inject 0.3 mL (0.3 mg total) under the skin once for 1 dose. 2 each 0    [START ON 02/05/2024] gabapentin  (NEURONTIN ) 250 mg/5 mL oral solution Take 12 mL (600 mg total) by mouth every morning AND 18 mL (900 mg total) nightly. 1080 mL 1    verapamil  (CALAN ) 40 MG tablet Take 1 tablet (40 mg total) by mouth nightly for 14 days, THEN 1 tablet (40 mg total) two (2) times a day for 28 days. 60 tablet 2     Current Facility-Administered Medications   Medication Dose Route Frequency Provider Last Rate Last Admin    omalizumab  (XOLAIR ) injection 300 mg  300 mg Subcutaneous Q28 Days [name], [name], [name]   300 mg at 05/24/22 1138     Imaging/Tests:     Lab Results   Component Value Date    PLT 318 11/28/2023     Lab Results   Component Value Date    CREATININE  0.90 11/02/2023     Lab Results   Component Value Date    A1C 5.4 11/28/2023     Lab Results   Component Value Date    ALKPHOS 110 01/19/2023    BILITOT 0.5 01/19/2023    BILIDIR 0.20 01/19/2023    PROT 7.9 01/19/2023    ALBUMIN 4.3 01/19/2023    ALT 10 01/19/2023    AST 18 01/19/2023       PHYSICAL EXAM:  BP 122/82  - Pulse 75  - Resp 16  - Ht 162.6 cm (5' 4)  - Wt 69.4 kg (153 lb)  - LMP 07/20/2017 (Exact Date)  - SpO2 98%  - BMI 26.26 kg/m??     Wt Readings from Last 3 Encounters:   01/05/24 69.4 kg (153 lb)   12/29/23 69.4 kg (153 lb) 12/28/23 69.4 kg (153 lb)     GENERAL:  The patient is well developed, well-nourished, and appears to be in no apparent distress.   HEAD/NECK:    Normocephalic/atraumatic. clear sclera, pupils not pinpoint  CV:  Warm and well perfused   LUNGS:   Normal work of breathing, no supplemental O2  EXTREMITIES:  No clubbing, cyanosis noted.  NEUROLOGIC:    The patient is alert and oriented, speech fluent, normal language.   MUSCULOSKELETAL:    Motor function  preserved. Good range of motion of all extremities. Gross weakness on LLE. Tenderness over L gluteal and SI joint and Greater trochanter   GAIT:  The patient rises from a seated position with no difficulty and ambulates with nonantalgic gait without the assistance of a walking aid.   SKIN:   No obvious rashes, lesions, or erythema.  PSY:   Appropriate affect. No overt pain behaviors. No evidence of psychomotor retardation or agitation, no signs of intoxication.     Medical Decision Making    Problems Addressed:  1 or more chronic illness w/ exacerbation, progression, or side effects of treatment (moderate)    Amount/Complexity:  Reviewed external records: Family medicine 12/25/23.  Sports med 12/27/23-piriformis injection.   GI medicine 12/28/23.  Fam med 12/29/23. and [place] PDMP (today on day of visit)  Reviewed results: None  Ordered tests: None  Assessment requiring independent historian: None  Discussion of management/test results with medical professional: None  Independent interpretation of test: None    Risk  Moderate: Prescription Drug Management, Minor surgery w/ risk factors, Elective Major Surgery w/o risk factors, Diagnosis/treatment significantly limited by social determinants of health    We are delivering comprehensive, continuous, longitudinal care for this patient with chronic pain.     I saw and evaluated the patient, participating in the key portions of the service.  I reviewed the resident's note and agree with the findings and plan.  Prentice Cost, MD

## 2024-01-08 MED ORDER — DEXTROAMPHETAMINE-AMPHETAMINE 20 MG TABLET
ORAL_TABLET | Freq: Two times a day (BID) | ORAL | 0 refills | 30.00000 days | Status: CP
Start: 2024-01-08 — End: 2024-02-07
  Filled 2024-01-10: qty 60, 30d supply, fill #0

## 2024-01-08 NOTE — Unmapped (Signed)
 [facility] Refill Coordination Note    Specialty Medication(s) to be Shipped:   CF/Pulmonary/Asthma: Xolair  (300 mg and 75 mg)    Other medication(s) to be shipped: prucalopride     Salathiel [facility], DOB: December 24, 1980  Phone: [phone] (home) [phone] (work)      All above HIPAA information was verified with patient.     Was a Nurse, learning disability used for this call? No    Completed refill call assessment today to schedule patient's medication shipment from the [facility]  [phone]).  All relevant notes have been reviewed.     Specialty medication(s) and dose(s) confirmed: Regimen is correct and unchanged.   Changes to medications: Loden reports no changes at this time.  Changes to insurance: No  New side effects reported not previously addressed with a pharmacist or physician: None reported  Questions for the pharmacist: No    Confirmed patient received a Conservation officer, historic buildings and a Surveyor, mining with first shipment. The patient will receive a drug information handout for each medication shipped and additional FDA Medication Guides as required.       DISEASE/MEDICATION-SPECIFIC INFORMATION        For patients on injectable medications: Patient currently has 0 doses left.  Next injection is scheduled for 01/16/24.    SPECIALTY MEDICATION ADHERENCE     Medication Adherence    Patient reported X missed doses in the last month: 0  Specialty Medication: omalizumab  300 mg/2 mL auto-injector (XOLAIR )              Were doses missed due to medication being on hold? No      omalizumab : XOLAIR  300 mg/2 mL auto-injector: 0 doses of medicine on hand       omalizumab : XOLAIR  75mg /0.5 mL auto-injector: 0 doses of medicine on hand     REFERRAL TO PHARMACIST     Referral to the pharmacist: Not needed      SHIPPING     Shipping address confirmed in Epic.     Cost and Payment: Patient has a $0 copay, payment information is not required.    Delivery Scheduled: Yes, Expected medication delivery date: 7.22.25.     Medication will be delivered via Same Day Courier to the prescription address in Epic WAM.    [facility]anda [facility]  Specialty Pharmacist

## 2024-01-09 ENCOUNTER — Inpatient Hospital Stay: Admit: 2024-01-09 | Discharge: 2024-01-09 | Payer: BLUE CROSS/BLUE SHIELD

## 2024-01-09 ENCOUNTER — Encounter
Admit: 2024-01-09 | Discharge: 2024-01-09 | Payer: BLUE CROSS/BLUE SHIELD | Attending: Student in an Organized Health Care Education/Training Program | Primary: Student in an Organized Health Care Education/Training Program

## 2024-01-09 ENCOUNTER — Encounter
Admit: 2024-01-09 | Discharge: 2024-01-09 | Payer: BLUE CROSS/BLUE SHIELD | Attending: Gastroenterology | Primary: Gastroenterology

## 2024-01-09 MED ADMIN — dexmedeTOMIDine (Precedex) injection: INTRAVENOUS | @ 15:00:00 | Stop: 2024-01-09

## 2024-01-09 MED ADMIN — lidocaine (PF) (XYLOCAINE-MPF) 20 mg/mL (2 %) injection: INTRAVENOUS | @ 15:00:00 | Stop: 2024-01-09

## 2024-01-09 MED ADMIN — propofol (DIPRIVAN) infusion 10 mg/mL: INTRAVENOUS | @ 15:00:00 | Stop: 2024-01-09

## 2024-01-09 MED ADMIN — Propofol (DIPRIVAN) injection: INTRAVENOUS | @ 15:00:00 | Stop: 2024-01-09

## 2024-01-09 MED FILL — PRUCALOPRIDE 2 MG TABLET: ORAL | 90 days supply | Qty: 90 | Fill #2

## 2024-01-09 MED FILL — XOLAIR 75 MG/0.5 ML SUBCUTANEOUS AUTO-INJECTOR: SUBCUTANEOUS | 28 days supply | Qty: 1 | Fill #0

## 2024-01-09 NOTE — Unmapped (Signed)
 Patient has telephone numbers if needed

## 2024-01-09 NOTE — Unmapped (Signed)
 Prior Authorization sent in to CoverMyMeds response Prior Auth Not required 01/09/24

## 2024-01-10 NOTE — Unmapped (Signed)
 [facility] [place]  OUTPATIENT PHYSICAL THERAPY  01/10/2024  Note Type: Treatment Note       Patient Name: Vincent Waters  Date of Birth:[birth year]  Diagnosis:   Encounter Diagnoses   Name Primary?    Chronic right-sided low back pain without sciatica Yes    Low back pain radiating to right lower extremity        Referring MD:  Debrah Morene Cadet,*     Date of Onset of Impairment-08/23/2016  Date PT Care Plan Established or Reviewed-12/06/2023  Date PT Treatment Started-08/24/2023   Visit Count: 17  Plan of Care Effective Date:          Assessment/Plan:    Assessment  Assessment details:    Vincent Waters continues to get relief from manual therapy.  Subjectively, he is reporting improved symptoms when he consistently does his stretching, core strengthening and limits his sitting.  His plank is slowly improving, demonstrating improving core strength.  He continues to have left sided core/trunk and hip weakness, which seems to be over loading his hip external rotators.  I still suspect that, if we can increase his plank time to >/= 1 minute to improve his core activation, he will feel much better.  We will likely continue to keep his symptoms low with manual therapy in clinic for the next few visits while he progresses his core/trunk strength independently, with occasional progressions to his strength program in clinic.  He agreed with this plan.  All questions answered.  This patient requires skilled physical therapy services to address the outlined impairments in order to return to his desired level of function.      Impairments: pain, muscular restrictions, hypertonicity, core weakness, impaired ADLs, joint restriction, decreased strength and gait deviation                      Therapy Goals      Goals:      Goals: Updated 01/10/2024.   In 12 weeks:  Goal #1: Patient will be independent with progressively monitored home exercise program.   Baseline/Current: COMPLETE.   Patient is familiar with home exercise program and performs it consistently.     Goal #2: Patient will demonstrate lumbar ROM within normal limits without pain in order to perform daily functional activities such as bending to pick an object off the ground, twisting to reach into the patient's backseat or bend backwards in order to lift an object overhead.   Baseline/Current: COMPLETE.  Patient now has normal lumbar ROM with reports of tightness (see ROM testing).     Goal #3: Patient will demonstrate right hip strength 5/5 during manual muscle testing for improved pelvic stability and improved walking, squatting and negotiating stairs.   Baseline/Current: NEARLY COMPLETE.  Patient has nearly normal right hip strength with residual weakness into extension and ER (see manual muscle testing).      Goal #4: Patient will be able to lift at least 75 lb from floor to waist with good lifting mechanics in order to demonstrate good core and trunk strength.   Baseline/Current: PARTIALLY COMPLETE.  Patient reports that he can lift a case of water (up to 40-50 lb) without a problem.    Goal #5: Patient will report improvement in symptoms while sitting and ability to sit up to 1 hour with minimal symptoms.  Baseline/Current: PARTIALLY COMPLETE.  Patient reports that he now has periods when he can sit without a problem, but when he  flares up he has difficulty sitting > 15 minutes.    Goal #6: Patient will demonstrate improved posture without cuing for pain </= 2/10.  Baseline/Current: IN PROGRESS.  Patient stands and sits in lordotic position.    Goal #7: Patient will be able to hold a front plank for >/= 1 minute to demonstrate improved core strength.  Baseline/Current: PARTIALLY COMPLETE.  Patient can now hold a front plank for ~ 40 seconds, reporting that he's starting to feel a bit stronger.    Plan    Therapy options: will be seen for skilled physical therapy services    Planned therapy interventions: 20560, 20561-Dry Needling 1-2, 3+ areas, 97010-Cold Packs/Hot Packs, 97012-Mechanical Traction, 97032, G0283-Electrical Stimulation (unattended, attended), 97110-Therapeutic Exercises, 97112-Neuromuscular Re-education, 97116-Gait Training, 97140-Manual Therapy, 97164-Re-evaluation (PT) and 97530-Therapeutic Activities      Frequency: 1x week    Duration in weeks: 8    Education provided to: patient.    Education provided: Surveyor, minerals, HEP and Treatment options and plan    Education results: verbalized good understanding, needs reinforcement, demonstrates understanding and needs further instruction.    Communication/Consultation: N/A.    Next visit plan:        Progress core strengthening, especially plank. Manual therapy/TDN as needed to calm left hip external rotators.      Total Session Time: 35    Treatment rendered today:      Manual Therapy: 35 minutes  - Soft Tissue Mobilization left hip external rotators  - Hip External Rotator PIR MET  - PROM Hip ER  - PT Assisted HSS With AROM Ankle DF  Vincent Waters  was informed of the risks associated with dry needling and he signed consent form.  Patient screened for any contraindications to needling including pregnancy, infection, antibiotic use, anticoagulants use, autoimmune issues, uncontrolled diabetes, and active cancer/treatment and does not have any of these conditions.  DRY NEEDLING FOR RELEASING IDENTIFIED TRIGGER POINTS TO FACILITATE MANUAL THERAPY TECHNIQUES AND ALLOW MUSCLE TO RETURN TO OPTIMAL LENGTH (NO CHARGE).  Left piriformis trigger point with 60mm x .30mm with Flat Palpation/Direct Approach established for safety - Local Twitch Response produced     Right upper trapezius trigger point with 50mm x .30mm with Pinch/Pull Technique established for safety - Local Twitch Response produced    Plan details: Access Code: [email or web address]  URL: [email or web address]  Date: 12/29/2023  Prepared by: Vincent Waters    Exercises  - Supine Posterior Pelvic Tilt  - 1 x daily - 7 x weekly - 1 sets - 20 reps - 5 hold  - Sidelying Thoracolumbar Mobilisation with Towel Roll  - 2 x daily - 7 x weekly - 1 sets - 1 reps - 5 minutes hold  - Clamshell with Resistance (Mirrored)  - 1 x daily - 7 x weekly - 2 sets - 10 reps - 3 hold  - Sidelying Hip Abduction (Mirrored)  - 1 x daily - 7 x weekly - 2 sets - 10 reps  - Hooklying Transversus Abdominis Palpation  - 1 x daily - 7 x weekly - 1 sets - 15 reps - 5 hold  - Supine March  - 1 x daily - 7 x weekly - 2 sets - 10 reps  - Supine 90/90 Alternating Toe Touch  - 1 x daily - 7 x weekly - 2 sets - 10 reps  - Full Plank  - 1 x daily - 7 x weekly - 1 sets - 3 reps - 30-45 hold  -  Supine Bridge with Leg Extension  - 1 x daily - 7 x weekly - 2 sets - 10 reps - 5 hold  - Bird Dog  - 1 x daily - 7 x weekly - 2 sets - 10 reps  - Supine Dead Bug with Leg Extension  - 1 x daily - 7 x weekly - 2 sets - 10 reps  - Quadruped Hip Abduction with Resistance Loop  - 1 x daily - 7 x weekly - 2 sets - 10 reps  - Sternocleidomastoid Stretch  - 1 x daily - 7 x weekly - 1 sets - 3 reps - 30 hold  - Supine Piriformis Stretch with Foot on Ground  - 1 x daily - 7 x weekly - 3 sets - 10 reps  - Supine Bilateral Isometric ER With Mobilization Belt  - 1 x daily - 7 x weekly - 15 sets - 10 reps - 5 hold  - Supine Bilateral Isometric Hip Abduction With Mobilization Belt  - 1 x daily - 7 x weekly - 3 sets - 10 reps  - Supine Figure 4 Piriformis Stretch  - 1 x daily - 7 x weekly - 1 sets - 10 reps - 10 hold  - Supine LE Neural Mobilization  - 1 x daily - 7 x weekly - 2 sets - 10 reps  - Supine Gluteal Sets  - 1 x daily - 7 x weekly - 1 sets - 20 reps - 5 hold  - Unilateral Isometric Glute Activation Bent Knee - Left  - 1 x daily - 7 x weekly - 3 sets - 10 reps      Subjective:     History of Present Condition     Date of onset:  08/23/2016    Date is approximation?: yes    Explanation:  Patient reports onset of symptoms in 2018.    History of Present Condition/Chief Complaint:  Vincent Waters is a 43 y.o. right hand dominant adult (male to male transition in process) who presents to outpatient physical therapy with chronic low back pressure, tightness and pain that began in 2018.  Patient cannot remember any specific traumatic mechanism of injury that may have been the result of his symptoms, but he does report that he broke his right leg 2x when he was younger.  The first one was when he was a baby.  The second time he broke it falling off the couch when he was 8.  He can't remember if it was his femur or tibia and he hasn't had a recent x-ray.  Subjective:  Vincent Waters reports that he's feeling a bit better in his hip.  He has been doing his stretches and he's finding that they are helping.  When he limits his length of sitting, he also has less flare-up.  He has been working on his plank and it's now up to 40 seconds.  He has been doing his exercises consistently.  Pain:     Current pain rating:  2  Location:  Left posterior hip radiating down the back of her leg    Quality:  Aching    Progression:  Improved      Objective:     Static Posture Assessment   Lumbar Spine-Increased lordosis.     Muscle Activation   Patient unable to activate left transverse abdominals and left multifidus.     Palpation   Left Hip   Hypertonic in the piriformis.  Tenderness of the piriformis.  Strength  Left Hip   Planes of Motion   Flexion: 5  Extension: 4+  Abduction: 4+  External rotation: 4  Internal rotation: 5  Right Hip   Planes of Motion   Flexion: 5  Extension: 5  Abduction: 5  External rotation: 4+  Internal rotation: 5                                  I attest that I have reviewed the above information.  Signed: Karena JINNY Waters, PT  01/10/2024 11:43 AM

## 2024-01-16 DIAGNOSIS — Z789 Other specified health status: Principal | ICD-10-CM

## 2024-01-16 DIAGNOSIS — Z79899 Other long term (current) drug therapy: Principal | ICD-10-CM

## 2024-01-16 DIAGNOSIS — F640 Transsexualism: Principal | ICD-10-CM

## 2024-01-16 MED ORDER — EASY TOUCH LUER LOCK SYRINGE 1 ML
0 refills | 0.00000 days | Status: CN
Start: 2024-01-16 — End: ?

## 2024-01-16 MED ORDER — TESTOSTERONE CYPIONATE 200 MG/ML INTRAMUSCULAR OIL
INTRAMUSCULAR | 0 refills | 84.00000 days | Status: CP
Start: 2024-01-16 — End: ?
  Filled 2024-01-29: qty 12, 84d supply, fill #0

## 2024-01-16 MED ORDER — BD REGULAR BEVEL NEEDLES 25 GAUGE X 5/8"
RESPIRATORY_TRACT | 0 refills | 0.00000 days | Status: CP
Start: 2024-01-16 — End: ?

## 2024-01-16 MED ORDER — SAFETY NEEDLES 18 GAUGE X 1 1/2"
0 refills | 0.00000 days | Status: CP
Start: 2024-01-16 — End: ?
  Filled 2024-01-29: qty 25, 84d supply, fill #0

## 2024-01-16 MED FILL — GABAPENTIN 250 MG/5 ML ORAL SOLUTION: ORAL | 36 days supply | Fill #0

## 2024-01-17 NOTE — Unmapped (Signed)
Pt contacted via phone.

## 2024-01-17 NOTE — Unmapped (Signed)
 [facility] [place]  OUTPATIENT PHYSICAL THERAPY  01/17/2024  Note Type: Treatment Note       Patient Name: Vincent Waters  Date of Birth:[birth year]  Diagnosis:   Encounter Diagnoses   Name Primary?    Chronic right-sided low back pain without sciatica Yes    Low back pain radiating to right lower extremity          Referring MD:  Debrah Morene Cadet,*     Date of Onset of Impairment-08/23/2016  Date PT Care Plan Established or Reviewed-12/06/2023  Date PT Treatment Started-08/24/2023   Visit Count: 18  Plan of Care Effective Date:          Assessment/Plan:    Assessment  Assessment details:    Marcques continues to get relief from manual therapy.  I do think we're on the right track, as I expect that he will feel better as we increase his strength long term.  I'd like him to get a front plank of 2 minutes and side plank time of 45 seconds to 1 minute to demonstrate improved core strength.  He is consistently moving towards that goal (started at 15 seconds, then progressed to 20 seconds, then 40 seconds and now 45 seconds). We will likely continue to keep his symptoms low with manual therapy in clinic for the next few visits while he progresses his core/trunk strength independently, with occasional progressions to his strength program in clinic.  He agreed with this plan.  Written instructions given for supine curl up and side plank.  He committed to performing them consistently as part of his home exercise program. All questions answered.  This patient requires skilled physical therapy services to address the outlined impairments in order to return to his desired level of function.      Impairments: pain, muscular restrictions, hypertonicity, core weakness, impaired ADLs, joint restriction, decreased strength and gait deviation                      Therapy Goals      Goals:      Goals: Updated 01/10/2024.   In 12 weeks:  Goal #1: Patient will be independent with progressively monitored home exercise program.   Baseline/Current: COMPLETE.   Patient is familiar with home exercise program and performs it consistently.     Goal #2: Patient will demonstrate lumbar ROM within normal limits without pain in order to perform daily functional activities such as bending to pick an object off the ground, twisting to reach into the patient's backseat or bend backwards in order to lift an object overhead.   Baseline/Current: COMPLETE.  Patient now has normal lumbar ROM with reports of tightness (see ROM testing).     Goal #3: Patient will demonstrate right hip strength 5/5 during manual muscle testing for improved pelvic stability and improved walking, squatting and negotiating stairs.   Baseline/Current: NEARLY COMPLETE.  Patient has nearly normal right hip strength with residual weakness into extension and ER (see manual muscle testing).      Goal #4: Patient will be able to lift at least 75 lb from floor to waist with good lifting mechanics in order to demonstrate good core and trunk strength.   Baseline/Current: PARTIALLY COMPLETE.  Patient reports that he can lift a case of water (up to 40-50 lb) without a problem.    Goal #5: Patient will report improvement in symptoms while sitting and ability to sit up to 1 hour with  minimal symptoms.  Baseline/Current: PARTIALLY COMPLETE.  Patient reports that he now has periods when he can sit without a problem, but when he flares up he has difficulty sitting > 15 minutes.    Goal #6: Patient will demonstrate improved posture without cuing for pain </= 2/10.  Baseline/Current: IN PROGRESS.  Patient stands and sits in lordotic position.    Goal #7: Patient will be able to hold a front plank for >/= 1 minute to demonstrate improved core strength.  Baseline/Current: PARTIALLY COMPLETE.  Patient can now hold a front plank for ~ 40 seconds, reporting that he's starting to feel a bit stronger.    Plan    Therapy options: will be seen for skilled physical therapy services    Planned therapy interventions: 20560, 20561-Dry Needling 1-2, 3+ areas, 97010-Cold Packs/Hot Packs, 97012-Mechanical Traction, 97032, G0283-Electrical Stimulation (unattended, attended), 97110-Therapeutic Exercises, 97112-Neuromuscular Re-education, 97116-Gait Training, 97140-Manual Therapy, 97164-Re-evaluation (PT) and 97530-Therapeutic Activities      Frequency: 1x week    Duration in weeks: 8    Education provided to: patient.    Education provided: Surveyor, minerals, HEP and Treatment options and plan    Education results: verbalized good understanding, needs reinforcement, demonstrates understanding and needs further instruction.    Communication/Consultation: N/A.    Next visit plan:        Progress core strengthening, especially plank. Manual therapy/TDN as needed to calm left hip external rotators.      Total Session Time: 35    Treatment rendered today:      Therapeutic Exercise: 20 minutes  Patient Education: Plan of Care and Home Exercise Program With Handout.   Exercises:  - Supine Pelvic Tilt: 20 x 10  - Supine Pelvic Tilt/TA/Heel Taps from 90/90: 2 x 12, bilateral, alternating  - Supine Bridge With Alternating Knee Extension: 2 x 10, bilateral, alternating  - Supine Pelvic Tilt/Curl Up: 2 x 10  - Side Plank: 3 x 10, bilateral    Manual Therapy: 15 minutes  - Soft Tissue Mobilization left hip external rotators  - Hip External Rotator PIR MET  - PROM Hip ER  [facility]  was informed of the risks associated with dry needling and he signed consent form.  Patient screened for any contraindications to needling including pregnancy, infection, antibiotic use, anticoagulants use, autoimmune issues, uncontrolled diabetes, and active cancer/treatment and does not have any of these conditions.  DRY NEEDLING FOR RELEASING IDENTIFIED TRIGGER POINTS TO FACILITATE MANUAL THERAPY TECHNIQUES AND ALLOW MUSCLE TO RETURN TO OPTIMAL LENGTH (NO CHARGE).  Left piriformis trigger point with 60mm x .30mm with Flat Palpation/Direct Approach established for safety - Local Twitch Response produced     Right upper trapezius trigger point with 50mm x .30mm with Pinch/Pull Technique established for safety - Local Twitch Response produced    Plan details: Access Code: [email or web address]  URL: [email or web address]  Date: 01/17/2024  Prepared by: Karena Harbour    Exercises  - Supine Posterior Pelvic Tilt  - 1 x daily - 7 x weekly - 1 sets - 20 reps - 5 hold  - Sidelying Thoracolumbar Mobilisation with Towel Roll  - 2 x daily - 7 x weekly - 1 sets - 1 reps - 5 minutes hold  - Clamshell with Resistance (Mirrored)  - 1 x daily - 7 x weekly - 2 sets - 10 reps - 3 hold  - Sidelying Hip Abduction (Mirrored)  - 1 x daily - 7 x weekly - 2 sets - 10 reps  -  Hooklying Transversus Abdominis Palpation  - 1 x daily - 7 x weekly - 1 sets - 15 reps - 5 hold  - Supine March  - 1 x daily - 7 x weekly - 2 sets - 10 reps  - Supine 90/90 Alternating Toe Touch  - 1 x daily - 7 x weekly - 2 sets - 10 reps  - Full Plank  - 1 x daily - 7 x weekly - 1 sets - 3 reps - 30-45 hold  - Supine Bridge with Leg Extension  - 1 x daily - 7 x weekly - 2 sets - 10 reps - 5 hold  - Bird Dog  - 1 x daily - 7 x weekly - 2 sets - 10 reps  - Supine Dead Bug with Leg Extension  - 1 x daily - 7 x weekly - 2 sets - 10 reps  - Quadruped Hip Abduction with Resistance Loop  - 1 x daily - 7 x weekly - 2 sets - 10 reps  - Sternocleidomastoid Stretch  - 1 x daily - 7 x weekly - 1 sets - 3 reps - 30 hold  - Supine Piriformis Stretch with Foot on Ground  - 1 x daily - 7 x weekly - 3 sets - 10 reps  - Supine Bilateral Isometric ER With Mobilization Belt  - 1 x daily - 7 x weekly - 15 sets - 10 reps - 5 hold  - Supine Bilateral Isometric Hip Abduction With Mobilization Belt  - 1 x daily - 7 x weekly - 3 sets - 10 reps  - Supine Figure 4 Piriformis Stretch  - 1 x daily - 7 x weekly - 1 sets - 10 reps - 10 hold  - Supine LE Neural Mobilization  - 1 x daily - 7 x weekly - 2 sets - 10 reps  - Supine Gluteal Sets  - 1 x daily - 7 x weekly - 1 sets - 20 reps - 5 hold  - Unilateral Isometric Glute Activation Bent Knee - Left  - 1 x daily - 7 x weekly - 3 sets - 10 reps  - Curl Up with Arms Crossed  - 1 x daily - 7 x weekly - 2 sets - 10 reps - 1 hold  - Side Plank on Elbow  - 1 x daily - 7 x weekly - 1 sets - 3 reps - 10 hold      Subjective:     History of Present Condition     Date of onset:  08/23/2016    Date is approximation?: yes    Explanation:  Patient reports onset of symptoms in 2018.    History of Present Condition/Chief Complaint:  Mr. Dahlem is a 43 y.o. right hand dominant adult (male to male transition in process) who presents to outpatient physical therapy with chronic low back pressure, tightness and pain that began in 2018.  Patient cannot remember any specific traumatic mechanism of injury that may have been the result of his symptoms, but he does report that he broke his right leg 2x when he was younger.  The first one was when he was a baby.  The second time he broke it falling off the couch when he was 8.  He can't remember if it was his femur or tibia and he hasn't had a recent x-ray.  Subjective:  Mr. Bethel reports that he is feeling tight and spasmed in his right low back and some in his  shoulder.  He feels a lot of it had to do with going to the dentist and sitting in the chair for 3 hours.  He has progressed his plank to 45 seconds, as of yesterday.  He has been doing his exercises consistently.  Pain:     Current pain rating:  2  Location:  Left posterior hip radiating down the back of her leg    Quality:  Aching    Progression:  Improved      Objective:     Static Posture Assessment   Lumbar Spine-Increased lordosis.     Muscle Activation   Patient unable to activate left transverse abdominals and left multifidus.     Palpation   Left Hip   Hypertonic in the piriformis.  Tenderness of the piriformis.    Strength  Left Hip   Planes of Motion   Flexion: 5  Extension: 4+  Abduction: 4+  External rotation: 4  Internal rotation: 5  Right Hip   Planes of Motion   Flexion: 5  Extension: 5  Abduction: 5  External rotation: 4+  Internal rotation: 5                                  I attest that I have reviewed the above information.  Signed: Karena JINNY Harbour, PT  01/17/2024 11:39 AM

## 2024-01-17 NOTE — Unmapped (Signed)
 Copied from CRM #[id number]. Topic: Access To Clinicians - [facility] Call Back  >> Jan 17, 2024  2:19 PM Leim T wrote:  Patient is calling  and stated he got a call from this number.  There is no documentation on where the call came from.

## 2024-01-22 NOTE — Unmapped (Signed)
 [facility] at [facility]  Established Patient Clinic Note    Assessment/Plan:   Vincent Waters is a 43 y.o.adult who presents for follow-up.    Problem List Items Addressed This Visit       Thoracic outlet syndrome     Other Visit Diagnoses         Piriformis syndrome, left    -  Primary      Tear of right acetabular labrum, subsequent encounter              - Current meds: Current Medications[1]  Assessment & Plan  # Left piriformis syndrome - chronic left shoulder pain - chronic right hip pain with labral tear - left-sided thoracic outlet syndrome  Chronic pain from multiple etiologies managed by Dr. Claryce. Insurance issues limit physical therapy. Is in the process of obtaining long-term disability due to inability to work. Agrees with my reaching out to Dr. Claryce to discuss functional assessment. In the meantime will continue current management.   - Discuss functional assessment for potential disability with Dr. Claryce  - Considering botox treatment for L shoulder pain   - Continue PT for now pending continued insurance coverage     HEALTH MAINTENANCE ITEMS STILL DUE:  There are no preventive care reminders to display for this patient.  Follow-up: No follow-ups on file.    Future Appointments   Date Time Provider Department Center   01/23/2024 10:00 AM Santo Karena Pac, PT PTOT [facility]   01/24/2024  1:00 PM Celena Rozelle BROCKS, MD [facility]   01/30/2024  1:00 PM Santo Karena Pac, PT PTOT [facility]   02/01/2024  9:00 AM Arne Lavanda Bang, PMHNP [facility]com [facility]   02/05/2024  9:00 AM [facility]UORO RM 8 [facility] [place]   02/05/2024 10:00 AM [facility] MRI RM 1 [facility] [place]   02/06/2024 12:15 PM Myer, Karena Pac, PT PTOT [facility]   02/12/2024 12:35 PM Argentina Geofm CROME, FNP [facility]   02/13/2024 11:30 AM Santo Karena Pac, PT PTOT [facility]   02/20/2024 10:45 AM Lia Ee, FNP [facility]   02/29/2024 10:30 AM Ave Bosworth, MD HVVSURMMNT [facility] 03/07/2024  9:00 AM Sebastian Pierce, FNP ANESPAINMRKT [facility]   03/28/2024 11:15 AM Kimple, Adam [place], MD UNCOTOMEWVIL [facility]   04/09/2024  8:20 AM Caro Concha Bruckner, MD CARDSTHPOINT [facility]   05/03/2024 12:30 PM Sonna Gee, MD UNCGIMEDET [facility]   06/03/2024  8:00 AM Caid, Comer PARAS, MD UNCALLERGET [facility]       Subjective   Vincent Waters is a 43 y.o. adult  coming to clinic today for follow-up.    Chief Complaint   Patient presents with    Follow-up     History of Present Illness  Vincent Waters is a 43 year old male who presents for evaluation of ongoing pain management issues.    Vincent Waters has chronic left shoulder pain that has not improved despite ongoing physical therapy, which has been complicated by insurance issues. Vincent Waters experiences recurrent left piriformis syndrome and back spasms, occurring every other week. Vincent Waters also has a history of a labral tear on the right hip, contributing to his overall pain and discomfort. Vincent Waters is currently under the care of a pain management specialist, and has been told that insurance constraints are making it difficult to continue physical therapy.    Vincent Waters has been out of work since July 2022 due to these chronic conditions and is  in the process of applying for disability. Vincent Waters has received letters for disability applications and is uncertain about the process, noting that it requires information about his medical history and the date Vincent Waters stopped working.    I have reviewed the problem list, medications, and allergies and have updated/reconciled them if needed.    Vincent Waters  reports that Vincent Waters has never smoked. Vincent Waters has never been exposed to tobacco smoke. Vincent Waters has never used smokeless tobacco.  Health Maintenance   Topic Date Due    Influenza Vaccine (1) 02/19/2024    DTaP/Tdap/Td Vaccines (2 - Td or Tdap) 08/19/2027    Lipid Screening  11/01/2028    Pneumococcal Vaccine 0-49  Completed    Hepatitis C Screen  Completed    COVID-19 Vaccine  Completed Objective     VITALS: BP 121/85 (BP Site: R Arm, BP Position: Sitting, BP Cuff Size: Medium)  - Pulse 72  - Temp 37.1 ??C (98.7 ??F) (Temporal)  - Resp 16  - Ht 162.6 cm (5' 4.02)  - Wt 69 kg (152 lb 3.2 oz)  - LMP 07/20/2017 (Exact Date)  - BMI 26.11 kg/m??     Physical Exam  General: well-appearing, sitting upright in no acute distress  Head: Normocephalic, atraumatic  ENT: No dental trauma noted.   Eyes: conjunctiva normal, non-erythematous, non-icteric, no discharge.  Neck: no thyroid enlargement or masses  Lungs: No increased work of breathing or audible wheezing  Skin: Warm, dry, no erythema or rash on exposed skin  Musculoskeletal: No visible gait abnormalities  Neurologic: Alert & oriented x 3, no gross sensorimotor abnormalities  Psychiatric: Pleasant, cooperative, good eye contact, appropriate thought processes    Wt Readings from Last 3 Encounters:   01/22/24 69 kg (152 lb 3.2 oz)   01/09/24 68 kg (150 lb)   01/05/24 69.4 kg (153 lb)      PHQ-9 PHQ-9 Total Score   10/22/2020   9:00 AM 6    09/04/2020   9:00 AM 3    08/21/2020   9:00 AM 5    05/22/2020   3:00 PM 9        Data saved with a previous flowsheet row definition     LABS/IMAGING: I have reviewed pertinent recent labs and imaging in Epic.    I personally spent a total of 20 minutes taking care of this patient including time with the patient, pre-visit planning, documentation, reviewing labs, coordinating care, and reviewing documentation from other providers in [facility] and in other specialties.  This was all done on the day of the visit.  My (and this clinic's) longitudinal relationship with this patient was instrumental in caring for this patient today including caring for their chronic medical and psychiatric conditions.      Odis Aho, MD, MPH (Vincent Waters/him)  [facility]  [address]   [place], [place] [address]  Phone: [phone]  Fax: [phone]       [1]   Current Outpatient Medications:     azelastine  (ASTELIN ) 137 mcg (0.1 %) nasal spray, Instill 2 sprays into each nostril two (2) times a day as needed for rhinitis., Disp: 30 mL, Rfl: 1    bisacodyl  (DULCOLAX) 5 mg EC tablet, Take 2 tablets (10 mg total) by mouth Take as directed. Take 2 tablets (10 mg total) as directed for bowel prep., Disp: 2 tablet, Rfl: 0    budesonide -formoterol  (SYMBICORT ) 160-4.5 mcg/actuation inhaler, Inhale 1 puff by mouth Two (2) times a  day., Disp: 10.2 g, Rfl: 5    cabotegravir  (APRETUDE ) 600 mg/3 mL extended-release injection, Inject 3 mL (600 mg total) into the muscle every thirty (30) days for 60 days, THEN 3 mL (600 mg total) every 8 weeks., Disp: 3 mL, Rfl: 11    clonazePAM  (KLONOPIN ) 0.5 MG tablet, Take 0.5 tablets (0.25 mg total) by mouth two (2) times a day as needed for anxiety., Disp: 30 tablet, Rfl: 5    cyclobenzaprine  (FLEXERIL ) 5 MG tablet, Take 1 tablet (5 mg total) by mouth Three (3) times a day as needed for muscle spasms., Disp: 15 tablet, Rfl: 1    dextroamphetamine -amphetamine  (ADDERALL) 20 mg tablet, Take 1 tablet (20 mg total) by mouth two (2) times a day., Disp: 60 tablet, Rfl: 0    diclofenac  sodium (VOLTAREN ) 1 % gel, Apply 2 g topically four (4) times a day., Disp: 100 g, Rfl: 0    [START ON 02/05/2024] gabapentin  (NEURONTIN ) 250 mg/5 mL oral solution, Take 12 mL (600 mg total) by mouth every morning AND 18 mL (900 mg total) nightly., Disp: 1080 mL, Rfl: 1    inhalational spacing device Spcr, Use as directed with albuterol  and symbicort , Disp: 1 each, Rfl: 1    ipratropium (ATROVENT ) 42 mcg (0.06 %) nasal spray, 1-2 sprays in each nostril as needed for drainage, up to 4 time daily., Disp: 15 mL, Rfl: 2    metoclopramide  (REGLAN ) 10 MG tablet, Take 1/2 tablet (5 mg total) by mouth Three (3) times a day with a meal., Disp: 135 tablet, Rfl: 3    nebulizer and compressor (COMP-AIR NEBULIZER COMPRESSOR) Devi, 1 each by Miscellaneous route nightly as needed., Disp: 1 each, Rfl: 0    needle, disp, 25 Arne (BD REGULAR BEVEL NEEDLES) 25 Ayaz x 5/8 Ndle, For subcutaneous hormone injection., Disp: 25 each, Rfl: 0    omalizumab  (XOLAIR ) 300 mg/2 mL auto-injector, Inject the contents of 1 pen (300 mg) under the skin every fourteen (14) days., Disp: 4 mL, Rfl: 11    omalizumab  75 mg/0.5 mL AtIn, Inject the contents of 1 pen (75 mg) under the skin every fourteen (14) days., Disp: 1 mL, Rfl: 11    pantoprazole  (PROTONIX ) 40 MG tablet, Take 1 tablet (40 mg total) by mouth daily., Disp: 90 tablet, Rfl: 3    polyethylene glycol (CLEARLAX) 17 gram/dose powder, Take as directed for extended bowel prep., Disp: 238 g, Rfl: 0    prucalopride (MOTEGRITY ) 2 mg Tab, Take 1 tablet (2 mg total) by mouth daily., Disp: 90 tablet, Rfl: 3    safety needles (BD ECLIPSE) 25 Kamen x 1 Ndle, use for testosterone , Disp: 12 each, Rfl: 0    safety needles (BD SAFETYGLIDE NEEDLE) 18 Herby x 1 1/2 Ndle, For drawing hormone injection, Disp: 25 each, Rfl: 0    syringe, disposable, (EASY TOUCH LUER LOCK SYRINGE) 1 mL Syrg, Use for weekly hormone injection., Disp: 4 each, Rfl: 0    syringe, disposable, (EASY TOUCH LUER LOCK SYRINGE) 1 mL Syrg, Use for testosterone , Disp: 12 each, Rfl: 0    syringe, disposable, 2.5 mL Syrg, Use weekly, Disp: 30 Syringe, Rfl: 2    testosterone  cypionate (DEPOTESTOTERONE CYPIONATE) 200 mg/mL injection, Inject 0.2 mL (40 mg total) into the muscle once a week., Disp: 12 mL, Rfl: 0    triamcinolone  (KENALOG ) 0.1 % cream, Apply topically Two (2) times a day., Disp: 15 g, Rfl: 0    albuterol  2.5 mg /3 mL (0.083 %) nebulizer solution,  Inhale 3 mL (2.5 mg total) by nebulization every six (6) hours as needed for wheezing or shortness of breath., Disp: 90 mL, Rfl: 1    cetirizine  (ZYRTEC ) 10 MG tablet, Take 1 tablet (10 mg total) by mouth daily. (Patient not taking: Reported on 01/09/2024), Disp: 30 tablet, Rfl: 12    EPINEPHrine  (EPIPEN ) 0.3 mg/0.3 mL injection, Inject 0.3 mL (0.3 mg total) under the skin once for 1 dose., Disp: 2 each, Rfl: 0    verapamil  (CALAN ) 40 MG tablet, Take 1 tablet (40 mg total) by mouth nightly for 14 days, THEN 1 tablet (40 mg total) two (2) times a day for 28 days., Disp: 60 tablet, Rfl: 2    Current Facility-Administered Medications:     omalizumab  (XOLAIR ) injection 300 mg, 300 mg, Subcutaneous, Q28 Days, [name], [name], [name], 300 mg at 05/24/22 1138

## 2024-01-23 NOTE — Unmapped (Addendum)
 [facility] AT [facility] [place]  OUTPATIENT PHYSICAL THERAPY  01/23/2024  Note Type: Treatment Note       Patient Name: Vincent Waters  Date of Birth:10-19-1980  Diagnosis:   Encounter Diagnoses   Name Primary?    Chronic right-sided low back pain without sciatica Yes    Low back pain radiating to right lower extremity          Referring MD:  Debrah Morene Cadet,*     Date of Onset of Impairment-08/23/2016  Date PT Care Plan Established or Reviewed-12/06/2023  Date PT Treatment Started-08/24/2023   Visit Count: 19  Plan of Care Effective Date:          Assessment/Plan:    Assessment  Assessment details:    I suspect that Vincent Waters was pulling on his hamstring too much, with too much volume of the stretch he was shown to do.  Educated him on doing the hip external rotator stretch in a way that will target his hip ER musculature without involving the hamstring as much.  Advised him to do 10-15 reps of 10-15, 2 x day and he agreed to do this.  He continues to get relief from manual therapy.  I do think we're still on the right track, as I expect that he will feel better as we increase his strength long term.  I'd like him to get a front plank of 2 minutes and side plank time of 45 seconds to 1 minute to demonstrate improved core strength.  He is consistently moving towards that goal (started at 15 seconds, then progressed to 20 seconds, then 40 seconds, then 45 seconds and now 50 seconds). We will likely continue to keep his symptoms low with manual therapy in clinic for the next few visits while he progresses his core/trunk strength independently, with occasional progressions to his strength program in clinic.  He agreed with this plan.  All questions answered.  This patient requires skilled physical therapy services to address the outlined impairments in order to return to his desired level of function.      Impairments: pain, muscular restrictions, hypertonicity, core weakness, impaired ADLs, joint restriction, decreased strength and gait deviation                      Therapy Goals      Goals:      Goals: Updated 01/23/2024.   In 12 weeks:  Goal #1: Patient will be independent with progressively monitored home exercise program.   Baseline/Current: COMPLETE.   Patient is familiar with home exercise program and performs it consistently.     Goal #2: Patient will demonstrate lumbar ROM within normal limits without pain in order to perform daily functional activities such as bending to pick an object off the ground, twisting to reach into the patient's backseat or bend backwards in order to lift an object overhead.   Baseline/Current: COMPLETE.  Patient now has normal lumbar ROM with reports of tightness (see ROM testing).     Goal #3: Patient will demonstrate right hip External Rotation strength 5/5 during manual muscle testing for improved pelvic stability for better walking, squatting and negotiating stairs, reporting pain </= 2/10 with those activities.   Baseline/Current: NEARLY COMPLETE.  Patient has nearly normal right hip strength with residual weakness into extension and ER (see manual muscle testing).      Goal #4: Patient will be able to lift at least 75 lb from floor to waist with  good lifting mechanics in order to demonstrate good core and trunk strength so he can lift grocery bags and items around the house with pain </= 2/10.   Baseline/Current: PARTIALLY COMPLETE.  Patient reports that he can lift a case of water (up to 40-50 lb) without a problem.    Goal #5: Patient will report improvement in symptoms while sitting and ability to sit up to 1 hour with minimal symptoms.  Baseline/Current: PARTIALLY COMPLETE.  Patient reports that he now has periods when he can sit without a problem, but when he flares up he has difficulty sitting > 15 minutes.    Goal #6: Patient will demonstrate improved posture without cuing for pain </= 2/10.  Baseline/Current: IN PROGRESS.  Patient stands and sits in lordotic position.    Goal #7: Patient will be able to hold a front plank for >/= 2 minutes to demonstrate improved core strength for improved tolerance to sitting and increased stability while walking.  Baseline/Current: PARTIALLY COMPLETE.  Patient can now hold a front plank for ~ 50 seconds, reporting that he's starting to feel a bit stronger.    Goal #8: Patient will be able to hold a side plank >/= 45 seconds to demonstrate improved core/trunk strength for improved sitting tolerance and increased stability while walking.  Baseline/Current: PARTIALLY COMPLETE.  Patient can hold a side plank bilaterally for ~ 15 seconds.    Plan    Therapy options: will be seen for skilled physical therapy services    Planned therapy interventions: 20560, 20561-Dry Needling 1-2, 3+ areas, 97010-Cold Packs/Hot Packs, 97012-Mechanical Traction, 97032, G0283-Electrical Stimulation (unattended, attended), 97110-Therapeutic Exercises, 97112-Neuromuscular Re-education, 97116-Gait Training, 97140-Manual Therapy, 97164-Re-evaluation (PT) and 97530-Therapeutic Activities      Frequency: 1x week    Duration in weeks: 8    Education provided to: patient.    Education provided: Surveyor, minerals, HEP and Treatment options and plan    Education results: verbalized good understanding, needs reinforcement, demonstrates understanding and needs further instruction.    Communication/Consultation: N/A.    Next visit plan:        Progress core strengthening, especially plank. Manual therapy/TDN as needed to calm left hip external rotators.      Total Session Time: 45    Treatment rendered today:      Therapeutic Exercise: 30 minutes  Patient Education: Plan of Care and Home Exercise Program With Handout.   Exercises:  - Supine Pelvic Tilt: 20 x 10  - Supine Pelvic Tilt/TA/Heel Taps from 90/90: 2 x 12, bilateral, alternating  - Supine Bridge With Alternating Knee Extension Using Band: 2 x 10, bilateral, alternating (L4)  - Side Plank, bent knee: 3 x 15, bilateral    Manual Therapy: 15 minutes  - Soft Tissue Mobilization left hip external rotators  - Hip External Rotator PIR MET  - PROM Hip ER  Vincent Waters [facility]  was informed of the risks associated with dry needling and he signed consent form.  Patient screened for any contraindications to needling including pregnancy, infection, antibiotic use, anticoagulants use, autoimmune issues, uncontrolled diabetes, and active cancer/treatment and does not have any of these conditions.  DRY NEEDLING FOR RELEASING IDENTIFIED TRIGGER POINTS TO FACILITATE MANUAL THERAPY TECHNIQUES AND ALLOW MUSCLE TO RETURN TO OPTIMAL LENGTH (NO CHARGE).  Left piriformis trigger point with 60mm x .30mm with Flat Palpation/Direct Approach established for safety - Local Twitch Response produced     Right upper trapezius trigger point with 50mm x .30mm with Pinch/Pull Technique established for  safety - Local Twitch Response produced    Plan details: Access Code: [email or web address]  URL: [email or web address]  Date: 01/17/2024  Prepared by: Karena Waters    Exercises  - Supine Posterior Pelvic Tilt  - 1 x daily - 7 x weekly - 1 sets - 20 reps - 5 hold  - Sidelying Thoracolumbar Mobilisation with Towel Roll  - 2 x daily - 7 x weekly - 1 sets - 1 reps - 5 minutes hold  - Clamshell with Resistance (Mirrored)  - 1 x daily - 7 x weekly - 2 sets - 10 reps - 3 hold  - Sidelying Hip Abduction (Mirrored)  - 1 x daily - 7 x weekly - 2 sets - 10 reps  - Hooklying Transversus Abdominis Palpation  - 1 x daily - 7 x weekly - 1 sets - 15 reps - 5 hold  - Supine March  - 1 x daily - 7 x weekly - 2 sets - 10 reps  - Supine 90/90 Alternating Toe Touch  - 1 x daily - 7 x weekly - 2 sets - 10 reps  - Full Plank  - 1 x daily - 7 x weekly - 1 sets - 3 reps - 30-45 hold  - Supine Bridge with Leg Extension  - 1 x daily - 7 x weekly - 2 sets - 10 reps - 5 hold  - Bird Dog  - 1 x daily - 7 x weekly - 2 sets - 10 reps  - Supine Dead Bug with Leg Extension  - 1 x daily - 7 x weekly - 2 sets - 10 reps  - Quadruped Hip Abduction with Resistance Loop  - 1 x daily - 7 x weekly - 2 sets - 10 reps  - Sternocleidomastoid Stretch  - 1 x daily - 7 x weekly - 1 sets - 3 reps - 30 hold  - Supine Piriformis Stretch with Foot on Ground  - 1 x daily - 7 x weekly - 3 sets - 10 reps  - Supine Bilateral Isometric ER With Mobilization Belt  - 1 x daily - 7 x weekly - 15 sets - 10 reps - 5 hold  - Supine Bilateral Isometric Hip Abduction With Mobilization Belt  - 1 x daily - 7 x weekly - 3 sets - 10 reps  - Supine Figure 4 Piriformis Stretch  - 1 x daily - 7 x weekly - 1 sets - 10 reps - 10 hold  - Supine LE Neural Mobilization  - 1 x daily - 7 x weekly - 2 sets - 10 reps  - Supine Gluteal Sets  - 1 x daily - 7 x weekly - 1 sets - 20 reps - 5 hold  - Unilateral Isometric Glute Activation Bent Knee - Left  - 1 x daily - 7 x weekly - 3 sets - 10 reps  - Curl Up with Arms Crossed  - 1 x daily - 7 x weekly - 2 sets - 10 reps - 1 hold  - Side Plank on Elbow  - 1 x daily - 7 x weekly - 1 sets - 3 reps - 10 hold      Subjective:     History of Present Condition     Date of onset:  08/23/2016    Date is approximation?: yes    Explanation:  Patient reports onset of symptoms in 2018.    History of Present Condition/Chief Complaint:  Vincent Waters is a 43 y.o.  right hand dominant adult (male to male transition in process) who presents to outpatient physical therapy with chronic low back pressure, tightness and pain that began in 2018.  Patient cannot remember any specific traumatic mechanism of injury that may have been the result of his symptoms, but he does report that he broke his right leg 2x when he was younger.  The first one was when he was a baby.  The second time he broke it falling off the couch when he was 8.  He can't remember if it was his femur or tibia and he hasn't had a recent x-ray.  Subjective:  Vincent Waters reports that he is feeling a bit achy and puffy.  He has increased his plank to 50 seconds.  He felt good after the needling after last session for about 2 days and then it started to tighten up on him.  He has been doing his hip external rotator exercises 20 x 30, 2x/day.  He has been doing his exercises consistently.  Pain:     Current pain rating:  3  Location:  Left posterior hip radiating down the back of her leg    Quality:  Aching    Progression:  Improved      Objective:     Static Posture Assessment   Lumbar Spine-Increased lordosis.     Palpation   Left Hip   Hypertonic in the piriformis.  Tenderness of the piriformis.    Strength  Left Hip   Planes of Motion   External rotation: 4                                  I attest that I have reviewed the above information.  Signed: Karena Vincent Waters, PT  01/23/2024 9:59 AM

## 2024-01-24 NOTE — Unmapped (Signed)
 Thank you for coming to [facility] clinic today.     We aim to provide you with the highest quality care.  If you have any unanswered questions after the visit or need to schedule a follow up visit, please do not hesitate to call us:     [facility] Medicine/Sports Medicine [place]:  [phone]     [facility] Family Medicine/Sports Medicine [place]:     [phone]     [facility] Medicine [place]   [phone]     You can also use MyChart to reach out to [place].     We look forward to seeing you again in the future and appreciate you coming to [facility].     Thank you.    Michaelene Song, MD   [facility]  [facility] Medicine   [facility]

## 2024-01-24 NOTE — Unmapped (Signed)
 [facility]       Patient Name:Vincent Waters  MRN: [id number]  DOB: [birth year]  Age: 43 y.o.   Date: 01/24/2024    SUBJECTIVE     Chief complaint: Hip Pain (Patient presents for follow up after piriformis injection.  He reports that he had some relief but it was short lived. He says that he has seen pain management and PT since his last visit. )      History of present Illness:  Vincent Waters is a 43 y.o. adult with  has a past medical history of Anxiety, Arthritis, Asthma (HHS-HCC), Autoimmune autonomic neuropathy, Breast cyst, Depression, Financial difficulties, Food intolerance (March 2018), Headache, tension-type, Lack of access to transportation, Migraines, Neuromuscular disorder, Rectal bleeding, Restless leg syndrome, Scleroderma, Seizures (07/27/2014), Sleep apnea treated with continuous positive airway pressure (CPAP), and Visual impairment. who comes in today in with complaint of Hip Pain (Patient presents for follow up after piriformis injection.  He reports that he had some relief but it was short lived. He says that he has seen pain management and PT since his last visit. )  .    History of Present Illness  Vincent Waters is a 43 year old male with piriformis syndrome who presents for follow-up of chronic pain management.    He has been experiencing chronic pain associated with piriformis syndrome. Despite undergoing an MRI and other diagnostic scans, results have been normal. He has been on gabapentin  for pain management for the past ten years, with varying effectiveness. Sometimes the medication works, but at other times it does not, requiring him to take breaks from it to 'reset' his body.    He describes a recent exacerbation of symptoms after performing exercises recommended by a healthcare provider, which led to increased pain in the back of his hamstring and swelling in his ankle. His ankle became swollen to the size of a baseball, and he experienced tightness and puffiness in the foot. Dry needling in the piriformis area caused pain to radiate to the front of his kneecap, described as feeling like 'somebody's drilling' into it.    He also has associated back pain that causes spasms. He notes that when the back spasm calms down, his buttock pain tends to flare up. He has been working on strengthening his core and right side to compensate left sided symptoms.  He mentions that right side may be overcompensating causing pain.    He has previously received injections for pain management, including a piriformis injection that provided temporary relief until this recent flare which he believes was caused by overstretching. He also had an EMG study three years ago, which suggested chronic L2, L3 radiculopathy.    He continues to engage in physical therapy and exercises, including clamshells and other hip strengthening activities, but is cautious about overstretching and causing further strain.        OBJECTIVE   LMP 07/20/2017 (Exact Date)   General/Constitutional: NAD    Comprehensive Left Hip Exam:  Inspection:   Ecchymosis: No  TTP:   Gluteal muscles and greater trochanter.   ROM  Int: Normal  Ext: Normal  Flexion: Normal  Strength:  Abd: 5/5  Flex: 5/5  Special Test:  Log roll: negative  FADIR: negative  FABER: negative  Meralgia: negative  Seated Piriformis: negative  Resisted External Derotation: not done  Long Stride Walking Test: not done  Deep Piriformis Stretch caused pain  Leg lengths: equal  Supine to sit test: not  done    Diagnostic Imaging   Imaging (if performed) was reviewed with the patient.    No Imaging was completed during this encounter.     ASSESSMENT AND PLAN     Pt is a 43 y.o. year old adult with:     Diagnosis ICD-10-CM Associated Orders   1. Chronic left hip pain  M25.552     G89.29       2. Piriformis syndrome, left  G57.02           Assessment & Plan  Piriformis syndrome with associated hamstring strain and radiating pain  Chronic piriformis syndrome with hamstring strain and radiating pain to the ankle. Previous imaging normal. Local anesthetic confirmed piriformis involvement. Recent overexertion likely exacerbated condition. Knee pain possibly due to nerve irritation 2/2 to Lumbar Radiculopathy. Current treatment includes physical therapy for core strengthening.  - Send message to [facility]J about foam rolling and myofascial release.  - Follow up in two months to reassess condition.  - Consider repeating piriformis injection if symptoms persist.  - Consider referral to spine specialist if symptoms do not improve with current treatment plan.    Greater trochanteric bursitis  Greater trochanteric bursitis with temporary relief from previous injections. Current pain possibly exacerbated by recent activity.  - Consider repeating trochanteric bursa injection if symptoms persist.    Chronic L2-L3 radiculopathy  Chronic L2-L3 radiculopathy noted on previous EMG, possibly contributing to anterior leg pain. Recent MRI normal. Nerve irritation considered despite normal imaging.  - Consider repeating EMG study to assess for additional findings.  - Consider referral to spine specialist for evaluation and possible injection/     Left ankle swelling  Intermittent left ankle swelling, possibly related to compensatory mechanisms due to hip and leg issues.      - Return in about 2 months (around 03/25/2024) for Follow-up on left hip and piriformis.    Vincent Bennett, MD, [facility]  [facility]   [facility]  [facility]

## 2024-01-29 MED FILL — BD REGULAR BEVEL NEEDLES 25 GAUGE X 5/8": ORAL | 84 days supply | Qty: 25 | Fill #0

## 2024-01-29 MED FILL — EASY TOUCH LUER LOCK SYRINGE 1 ML: 84 days supply | Qty: 12 | Fill #0

## 2024-01-30 NOTE — Unmapped (Signed)
 [facility]AP[facility] AT [place]  OUTPATIENT PHYSICAL THERAPY  01/30/2024  Note Type: Treatment Note       Patient Name: Vincent Waters  Date of Birth:1980/09/23  Diagnosis:   Encounter Diagnoses   Name Primary?    Chronic right-sided low back pain without sciatica Yes    Low back pain radiating to right lower extremity          Referring MD:  Vincent Waters,*     Date of Onset of Impairment-08/23/2016  Date PT Care Plan Established or Reviewed-12/06/2023  Date PT Treatment Started-08/24/2023   Visit Count: 20  Plan of Care Effective Date:          Assessment/Plan:    Assessment  Assessment details:    Vincent Waters continues to get relief from manual therapy.  I do think we're still on Vincent right track, as I expect that he will feel better as we increase his strength long term.  I'd like him to get a front plank of 2 minutes and side plank time of 45 seconds to 1 minute to demonstrate improved core strength.  He is consistently moving towards that goal (his front plank started at 15 seconds, then progressed to 20 seconds, then 40 seconds, then 45 seconds, then 50 seconds and he was able to do 55 seconds today with some shaking; side plank has improved from 15 seconds to 20 seconds). We will likely continue to keep his symptoms low with manual therapy in clinic for Vincent next few visits while he progresses his core/trunk strength independently, with occasional progressions to his strength program in clinic.  He agreed with this plan.  All questions answered.  This patient requires skilled physical therapy services to address Vincent outlined impairments in order to return to his desired level of function.      Impairments: pain, muscular restrictions, hypertonicity, core weakness, impaired ADLs, joint restriction, decreased strength and gait deviation                      Therapy Goals      Goals:      Goals: Updated 01/23/2024.   In 12 weeks:  Goal #1: Patient will be independent with progressively monitored home exercise program.   Baseline/Current: COMPLETE.   Patient is familiar with home exercise program and performs it consistently.     Goal #2: Patient will demonstrate lumbar ROM within normal limits without pain in order to perform daily functional activities such as bending to pick an object off Vincent ground, twisting to reach into Vincent patient's backseat or bend backwards in order to lift an object overhead.   Baseline/Current: COMPLETE.  Patient now has normal lumbar ROM with reports of tightness (see ROM testing).     Goal #3: Patient will demonstrate right hip External Rotation strength 5/5 during manual muscle testing for improved pelvic stability for better walking, squatting and negotiating stairs, reporting pain </= 2/10 with those activities.   Baseline/Current: NEARLY COMPLETE.  Patient has nearly normal right hip strength with residual weakness into extension and ER (see manual muscle testing).      Goal #4: Patient will be able to lift at least 75 lb from floor to waist with good lifting mechanics in order to demonstrate good core and trunk strength so he can lift grocery bags and items around Vincent house with pain </= 2/10.   Baseline/Current: PARTIALLY COMPLETE.  Patient reports that he can lift a case of water (up  to 40-50 lb) without a problem.    Goal #5: Patient will report improvement in symptoms while sitting and ability to sit up to 1 hour with minimal symptoms.  Baseline/Current: PARTIALLY COMPLETE.  Patient reports that he now has periods when he can sit without a problem, but when he flares up he has difficulty sitting > 15 minutes.    Goal #6: Patient will demonstrate improved posture without cuing for pain </= 2/10.  Baseline/Current: IN PROGRESS.  Patient stands and sits in lordotic position.    Goal #7: Patient will be able to hold a front plank for >/= 2 minutes to demonstrate improved core strength for improved tolerance to sitting and increased stability while walking.  Baseline/Current: PARTIALLY COMPLETE.  Patient can now hold a front plank for ~ 50 seconds, reporting that he's starting to feel a bit stronger.    Goal #8: Patient will be able to hold a side plank >/= 45 seconds to demonstrate improved core/trunk strength for improved sitting tolerance and increased stability while walking.  Baseline/Current: PARTIALLY COMPLETE.  Patient can hold a side plank bilaterally for ~ 15 seconds.    Plan    Therapy options: will be seen for skilled physical therapy services    Planned therapy interventions: 20560, 20561-Dry Needling 1-2, 3+ areas, 97010-Cold Packs/Hot Packs, 97012-Mechanical Traction, 97032, G0283-Electrical Stimulation (unattended, attended), 97110-Therapeutic Exercises, 97112-Neuromuscular Re-education, 97116-Gait Training, 97140-Manual Therapy, 97164-Re-evaluation (PT) and 97530-Therapeutic Activities      Frequency: 1x week    Duration in weeks: 8    Education provided to: patient.    Education provided: Surveyor, minerals, HEP and Treatment options and plan    Education results: verbalized good understanding, needs reinforcement, demonstrates understanding and needs further instruction.    Communication/Consultation: N/A.    Next visit plan:        Progress core strengthening, especially plank. Manual therapy/TDN as needed to calm left hip external rotators.      Total Treatment Time: 30      Treatment rendered today:      Therapeutic Exercise: 15 minutes  Patient Education: Plan of Care and Home Exercise Program  Exercises:  - Front Plank  - Side Plank, straight knee    Manual Therapy: 15 minutes  - Soft Tissue Mobilization left hip external rotators  - Hip External Rotator PIR MET  - PROM Hip ER  Vincent Waters Vincent Waters  was informed of Vincent risks associated with dry needling and he signed consent form.  Patient screened for any contraindications to needling including pregnancy, infection, antibiotic use, anticoagulants use, autoimmune issues, uncontrolled diabetes, and active cancer/treatment and does not have any of these conditions.  DRY NEEDLING FOR RELEASING IDENTIFIED TRIGGER POINTS TO FACILITATE MANUAL THERAPY TECHNIQUES AND ALLOW MUSCLE TO RETURN TO OPTIMAL LENGTH (NO CHARGE).  Left piriformis trigger point with 60mm x .30mm with Flat Palpation/Direct Approach established for safety - Local Twitch Response produced     Right upper trapezius trigger point with 50mm x .30mm with Pinch/Pull Technique established for safety - Local Twitch Response produced    Plan details: Access Code: [email or web address]  URL: [email or web address]  Date: 01/17/2024  Prepared by: Karena Harbour    Exercises  - Supine Posterior Pelvic Tilt  - 1 x daily - 7 x weekly - 1 sets - 20 reps - 5 hold  - Sidelying Thoracolumbar Mobilisation with Towel Roll  - 2 x daily - 7 x weekly - 1 sets - 1 reps - 5 minutes hold  -  Clamshell with Resistance (Mirrored)  - 1 x daily - 7 x weekly - 2 sets - 10 reps - 3 hold  - Sidelying Hip Abduction (Mirrored)  - 1 x daily - 7 x weekly - 2 sets - 10 reps  - Hooklying Transversus Abdominis Palpation  - 1 x daily - 7 x weekly - 1 sets - 15 reps - 5 hold  - Supine March  - 1 x daily - 7 x weekly - 2 sets - 10 reps  - Supine 90/90 Alternating Toe Touch  - 1 x daily - 7 x weekly - 2 sets - 10 reps  - Full Plank  - 1 x daily - 7 x weekly - 1 sets - 3 reps - 30-45 hold  - Supine Bridge with Leg Extension  - 1 x daily - 7 x weekly - 2 sets - 10 reps - 5 hold  - Bird Dog  - 1 x daily - 7 x weekly - 2 sets - 10 reps  - Supine Dead Bug with Leg Extension  - 1 x daily - 7 x weekly - 2 sets - 10 reps  - Quadruped Hip Abduction with Resistance Loop  - 1 x daily - 7 x weekly - 2 sets - 10 reps  - Sternocleidomastoid Stretch  - 1 x daily - 7 x weekly - 1 sets - 3 reps - 30 hold  - Supine Piriformis Stretch with Foot on Ground  - 1 x daily - 7 x weekly - 3 sets - 10 reps  - Supine Bilateral Isometric ER With Mobilization Belt  - 1 x daily - 7 x weekly - 15 sets - 10 reps - 5 hold  - Supine Bilateral Isometric Hip Abduction With Mobilization Belt  - 1 x daily - 7 x weekly - 3 sets - 10 reps  - Supine Figure 4 Piriformis Stretch  - 1 x daily - 7 x weekly - 1 sets - 10 reps - 10 hold  - Supine LE Neural Mobilization  - 1 x daily - 7 x weekly - 2 sets - 10 reps  - Supine Gluteal Sets  - 1 x daily - 7 x weekly - 1 sets - 20 reps - 5 hold  - Unilateral Isometric Glute Activation Bent Knee - Left  - 1 x daily - 7 x weekly - 3 sets - 10 reps  - Curl Up with Arms Crossed  - 1 x daily - 7 x weekly - 2 sets - 10 reps - 1 hold  - Side Plank on Elbow  - 1 x daily - 7 x weekly - 1 sets - 3 reps - 10 hold      Subjective:     History of Present Condition     Date of onset:  08/23/2016    Date is approximation?: yes    Explanation:  Patient reports onset of symptoms in 2018.    History of Present Condition/Chief Complaint:  Mr. Kokesh is a 43 y.o. right hand dominant adult (male to male transition in process) who presents to outpatient physical therapy with chronic low back pressure, tightness and pain that began in 2018.  Patient cannot remember any specific traumatic mechanism of injury that may have been Vincent result of his symptoms, but he does report that he broke his right leg 2x when he was younger.  Vincent first one was when he was a baby.  Vincent second time he broke it falling off Vincent  couch when he was 8.  He can't remember if it was his femur or tibia and he hasn't had a recent x-ray.  Subjective:  Mr. Gobin reports that he has been working hard on his plank times.  For this week, he is still is at 50 seconds for front plank and 15 seconds for side plank on each side.  He has been doing his exercises consistently.  Pain:     Current pain rating:  5  Location:  Left posterior hip radiating down Vincent back of her leg    Quality:  Aching    Progression:  Improved      Objective:     Static Posture Assessment   Lumbar Spine-Increased lordosis.     Palpation   Left Hip   Hypertonic in Vincent piriformis.  Tenderness of Vincent piriformis.    Tests     Functional Assessment   Functional Assessment Comments-Front Plank: 55 seconds  Side Plank:  Right: 20 seconds  Left: 20 seconds                                  I attest that I have reviewed Vincent above information.  Signed: Karena JINNY Harbour, PT  01/30/2024 12:55 PM

## 2024-01-30 NOTE — Unmapped (Unsigned)
 [facility]  Psychiatry   Established Patient E&M Service - Outpatient                                                                   [facility] - [place]    Name: Vincent Waters  Date: 02/01/2024  MRN: [id number]  DOB: [birth year]  PCP: Debrah Morene Cadet, MD    Assessment:    [facility] presents for follow-up evaluation.     Pt is pleasant and cooperative. He feels the increased frequency of Adderall has been helpful for ADHD and anxiety is under good control. Multiple medical issues at play which pt has to cope with, he recently filed for disability. No safety concerns, no med changes. Follow up in 3 months.       Identifying Information:  Vincent Waters is a 43 y.o. adult with a history of GAD, Panic disorder, ADHD. Pt has long hx of anxiety, ADHD (since age 89), former dx of Bipolar disorder which has been unfounded.     Risk Assessment:  An assessment of suicide and violence risk factors was performed as part of this evaluation and is not significantly increased from the last visit.   While future psychiatric events cannot be accurately predicted, the patient does not currently require acute inpatient psychiatric care and does not currently meet [facility]  involuntary commitment criteria.      Plan:    Problem: GAD/panic disorder  Status of problem: chronic and stable  Interventions:   -- continue Klonopin  0.25mg  PO BID for anxiety/panic  -- pt has therapist he was referred to, will make appt  -- pt has long hx of being on multiple past meds, did not respond well to SSRI's      Problem: ADHD  Status of problem: worsening  Interventions:   --continue Adderall 20mg  PO BID, restarted 08/17/23, inc 11/09/23 (pt prefers immediate release over XR formulation). Pt was dx as child, started on Ritalin age 70 and in supportive classes at school.   -- does not like long acting stimulants, upset stomach      Psychotherapy provided:  No billable psychotherapy service provided.      There is no height or weight on file to calculate BMI.    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 Debrah     Labs:    Not indicated    Guardian:  self  Therapy:  Has external therapist - on leave but back in July  Sees Dr. Debrah every 2 weeks - able to talk to him about anything    Refills sent: for klonopin  and adderall    Scheduled follow up visit:  04/25/24 at 0930 for 30 min video visit with abby Neidra Girvan    Patient has been given information on how to contact this clinician for concerns. The patient has been instructed to call 911 for emergencies.      Subjective:    Interval History:     Met with pt for re-evaluation, pt is pleasant and cooperative    Been dealing with multiple chronic medical issues - worsening  Glut muscle spasms and causing ankle to swell    Pain doc out of options for shoulder pain  Going to PT x 2 years  Chronic hip pain as well    3 month injection lasted only 4 weeks    Gastroparesis as well  Has a sleep study next week  Stressed as been in constant pain    Sent in disability paperwork last week    Going to dental school for wisdom teeth removal  Needs top teeth removed and has to get dentures  Bottom teeth ok, will need a few partials    Higher dose of Adderall helpful for concentration  Not using Klonopin   every day  Feels anxiety is under good control  Sleeping well - sometimes in pain  Denies SI    Had to cut back on driving lyft due to pain when sitting    Did not go on trips that were planned -was going to drive to [place]  and [place]     Walking more, gym - standing helps alleviate the pain, able to exercise    Pt will reach out if needs to see provider prior to 3 month appt.            Objective:    Mental Status Exam:  Appearance:    Appears stated age, Well nourished, Well developed, and Clean/Neat   Motor:   No abnormal movements   Speech/Language:    Normal rate, volume, tone, fluency   Mood:   ok   Affect:   Calm, Cooperative, and Euthymic   Thought process and Associations:   Logical, linear, clear, coherent, goal directed   Abnormal/psychotic thought content:     Denies SI, HI, self harm, delusions, obsessions, paranoid ideation, or ideas of reference   Perceptual disturbances:     Denies auditory and visual hallucinations, behavior not concerning for response to internal stimuli     Other:          Visit was completed by video (or phone) and the appropriate disclaimer has been included below.      The patient reports they are physically located in [place]  and is currently: at home. I conducted a audio/video visit. I spent  62m 48s on the video call with the patient. I spent an additional 12 minutes on pre- and post-visit activities on the date of service .   I personally spent 27 minutes face-to-face and non-face-to-face in the care of this patient, which includes all pre, intra, and post visit time on the date of service.  All documented time was specific to the E/M visit and does not include any procedures that may have been performed.        Lavanda GORMAN Shape, PMHNP  February 01, 2024 9:25 AM involved in the patient's care. (Optional):[id number]}  Lavanda GORMAN Shape, PMHNP  ***

## 2024-02-01 DIAGNOSIS — F411 Generalized anxiety disorder: Principal | ICD-10-CM

## 2024-02-01 DIAGNOSIS — F902 Attention-deficit hyperactivity disorder, combined type: Principal | ICD-10-CM

## 2024-02-01 MED ORDER — DEXTROAMPHETAMINE-AMPHETAMINE 20 MG TABLET
ORAL_TABLET | Freq: Two times a day (BID) | ORAL | 0 refills | 30.00000 days | Status: CP
Start: 2024-02-01 — End: 2024-03-02
  Filled 2024-02-06: qty 60, 30d supply, fill #0

## 2024-02-01 MED ORDER — CLONAZEPAM 0.5 MG TABLET
ORAL_TABLET | Freq: Two times a day (BID) | ORAL | 5 refills | 30.00000 days | Status: CP | PRN
Start: 2024-02-01 — End: 2024-07-30
  Filled 2024-02-06: qty 30, 30d supply, fill #0

## 2024-02-01 NOTE — Unmapped (Signed)
 Follow-up instructions:  -- Please continue taking your medications as prescribed for your mental health.   -- Do not make changes to your medications, including taking more or less than prescribed, unless under the supervision of your physician. Be aware that some medications may make you feel worse if abruptly stopped.  -- Please refrain from using illicit substances, as these can affect your mood and could cause anxiety or other concerning symptoms.   -- Seek further medical care for any increase in symptoms or new symptoms such as thoughts of wanting to hurt yourself or hurt others.     Contact info:  Life-threatening emergencies: call 911 or go to the nearest ER for medical or psychiatric attention. [place] Crisis line [phone]    Urgent questions, Non-urgent routine concerns, questions, and refill requests: please call the clinic at [phone].    Regarding appointments:  - If you need to cancel your appointment, we ask that you call  [phone]  at least 24 hours before your scheduled appointment.  - If for any reason you arrive 15 minutes later than your scheduled appointment time, you may not be seen and your visit may be rescheduled.  - Please remember that we will not automatically reschedule missed appointments.  - If you miss two (2) appointments without letting us  know in advance, you will likely be referred to a provider in your community.  - We will do our best to be on time. Sometimes an emergency will arise that might cause your clinician to be late. We will try to inform you of this when you check in for your appointment. If you wait more than 15 minutes past your appointment time without such notice, please speak with the front desk staff.    In the event of bad weather, the clinic staff will attempt to contact you, should your appointment need to be rescheduled. Additionally, you can call the Patient Weather Line [phone] for system-wide clinic status     For more information and reminders regarding clinic policies (these were provided when you were admitted to the clinic), please ask the front desk.      Current Outpatient Medications:     albuterol  2.5 mg /3 mL (0.083 %) nebulizer solution, Inhale 3 mL (2.5 mg total) by nebulization every six (6) hours as needed for wheezing or shortness of breath., Disp: 90 mL, Rfl: 1    azelastine  (ASTELIN ) 137 mcg (0.1 %) nasal spray, Instill 2 sprays into each nostril two (2) times a day as needed for rhinitis., Disp: 30 mL, Rfl: 1    bisacodyl  (DULCOLAX) 5 mg EC tablet, Take 2 tablets (10 mg total) by mouth Take as directed. Take 2 tablets (10 mg total) as directed for bowel prep., Disp: 2 tablet, Rfl: 0    budesonide -formoterol  (SYMBICORT ) 160-4.5 mcg/actuation inhaler, Inhale 1 puff by mouth Two (2) times a day., Disp: 10.2 g, Rfl: 5    cabotegravir  (APRETUDE ) 600 mg/3 mL extended-release injection, Inject 3 mL (600 mg total) into the muscle every thirty (30) days for 60 days, THEN 3 mL (600 mg total) every 8 weeks., Disp: 3 mL, Rfl: 11    cetirizine  (ZYRTEC ) 10 MG tablet, Take 1 tablet (10 mg total) by mouth daily. (Patient not taking: Reported on 01/09/2024), Disp: 30 tablet, Rfl: 12    clonazePAM  (KLONOPIN ) 0.5 MG tablet, Take 0.5 tablets (0.25 mg total) by mouth two (2) times a day as needed for anxiety., Disp: 30 tablet, Rfl: 5    cyclobenzaprine  (  FLEXERIL ) 5 MG tablet, Take 1 tablet (5 mg total) by mouth Three (3) times a day as needed for muscle spasms., Disp: 15 tablet, Rfl: 1    dextroamphetamine -amphetamine  (ADDERALL) 20 mg tablet, Take 1 tablet (20 mg total) by mouth two (2) times a day., Disp: 60 tablet, Rfl: 0    [START ON 03/02/2024] dextroamphetamine -amphetamine  (ADDERALL) 20 mg tablet, Take 1 tablet (20 mg total) by mouth two (2) times a day., Disp: 60 tablet, Rfl: 0    [START ON 04/01/2024] dextroamphetamine -amphetamine  (ADDERALL) 20 mg tablet, Take 1 tablet (20 mg total) by mouth two (2) times a day., Disp: 60 tablet, Rfl: 0 EPINEPHrine  (EPIPEN ) 0.3 mg/0.3 mL injection, Inject 0.3 mL (0.3 mg total) under the skin once for 1 dose., Disp: 2 each, Rfl: 0    [START ON 02/05/2024] gabapentin  (NEURONTIN ) 250 mg/5 mL oral solution, Take 12 mL (600 mg total) by mouth every morning AND 18 mL (900 mg total) nightly., Disp: 1080 mL, Rfl: 1    inhalational spacing device Spcr, Use as directed with albuterol  and symbicort , Disp: 1 each, Rfl: 1    ipratropium (ATROVENT ) 42 mcg (0.06 %) nasal spray, 1-2 sprays in each nostril as needed for drainage, up to 4 time daily., Disp: 15 mL, Rfl: 2    metoclopramide  (REGLAN ) 10 MG tablet, Take 1/2 tablet (5 mg total) by mouth Three (3) times a day with a meal., Disp: 135 tablet, Rfl: 3    nebulizer and compressor (COMP-AIR NEBULIZER COMPRESSOR) Devi, 1 each by Miscellaneous route nightly as needed., Disp: 1 each, Rfl: 0    needle, disp, 25 Hillery (BD REGULAR BEVEL NEEDLES) 25 Anuel x 5/8 Ndle, For subcutaneous hormone injection., Disp: 25 each, Rfl: 0    omalizumab  (XOLAIR ) 300 mg/2 mL auto-injector, Inject the contents of 1 pen (300 mg) under the skin every fourteen (14) days., Disp: 4 mL, Rfl: 11    omalizumab  75 mg/0.5 mL AtIn, Inject the contents of 1 pen (75 mg) under the skin every fourteen (14) days., Disp: 1 mL, Rfl: 11    pantoprazole  (PROTONIX ) 40 MG tablet, Take 1 tablet (40 mg total) by mouth daily., Disp: 90 tablet, Rfl: 3    polyethylene glycol (CLEARLAX) 17 gram/dose powder, Take as directed for extended bowel prep., Disp: 238 g, Rfl: 0    prucalopride (MOTEGRITY ) 2 mg Tab, Take 1 tablet (2 mg total) by mouth daily., Disp: 90 tablet, Rfl: 3    safety needles (BD ECLIPSE) 25 Xaden x 1 Ndle, use for testosterone , Disp: 12 each, Rfl: 0    safety needles (BD SAFETYGLIDE NEEDLE) 18 Jjesus x 1 1/2 Ndle, For drawing hormone injection, Disp: 25 each, Rfl: 0    syringe, disposable, (EASY TOUCH LUER LOCK SYRINGE) 1 mL Syrg, Use for weekly hormone injection., Disp: 4 each, Rfl: 0    syringe, disposable, (EASY TOUCH LUER LOCK SYRINGE) 1 mL Syrg, Use for testosterone , Disp: 12 each, Rfl: 0    syringe, disposable, 2.5 mL Syrg, Use weekly, Disp: 30 Syringe, Rfl: 2    testosterone  cypionate (DEPOTESTOTERONE CYPIONATE) 200 mg/mL injection, Inject 0.2 mL (40 mg total) into the muscle once a week., Disp: 12 mL, Rfl: 0    triamcinolone  (KENALOG ) 0.1 % cream, Apply topically Two (2) times a day., Disp: 15 g, Rfl: 0    verapamil  (CALAN ) 40 MG tablet, Take 1 tablet (40 mg total) by mouth nightly for 14 days, THEN 1 tablet (40 mg total) two (2) times a day for 28  days., Disp: 60 tablet, Rfl: 2    Current Facility-Administered Medications:     omalizumab  (XOLAIR ) injection 300 mg, 300 mg, Subcutaneous, Q28 Days, [name], [name], [name], 300 mg at 05/24/22 1138      Urgent Crisis Resources    [facility] Line  Call: [phone]    8595514071 - National Suicide Hotline    [facility]    [facility]  [phone]    24/7 mobile crisis response for behavioral health crises    [place] [employer] [facility] Crisis Unit  [phone]    24 hour co-response team - for domestic violence, people in mental health crisis, traumatic situations, needing safety planning, victims of crime, stalking/harassment, outreach to vulnerable people    [facility]  [address]  [place], [place] [address]  [phone]  24/7 crisis facility - mental health assessment, treatment and stabilization    [facility]  [phone]    [facility])  [address], [place],    [place] [address]  Ph: [phone].    [facility] , 24/7  [address], [place] [address]      Ph: [phone]    [facility]  [phone] (24 hours a day, 7 days a week)    [facility] Crisis Line   [phone] (24 hours a day, 7 days a week)    National Suicide and Crisis Lifeline   988 (24 hours a day, 7 days a week)

## 2024-02-05 ENCOUNTER — Ambulatory Visit: Admit: 2024-02-05 | Payer: BLUE CROSS/BLUE SHIELD

## 2024-02-05 MED ORDER — GABAPENTIN 250 MG/5 ML ORAL SOLUTION
ORAL | 1 refills | 36.00000 days | Status: CP
Start: 2024-02-05 — End: ?
  Filled 2024-02-09: 36d supply, fill #1

## 2024-02-06 MED ORDER — VARICELLA-ZOSTER GLYCOE VACC-AS01B ADJ(PF) 50 MCG/0.5 ML IM SUSP, KIT
INTRAMUSCULAR | 1 refills | 0.00000 days
Start: 2024-02-06 — End: ?

## 2024-02-06 MED FILL — SHINGRIX (PF) 50 MCG/0.5 ML INTRAMUSCULAR SUSPENSION, KIT: INTRAMUSCULAR | 1 days supply | Qty: 0.5 | Fill #0

## 2024-02-06 NOTE — Unmapped (Signed)
 Called patient to get them scheduled to get their Apretude  Injection on a nurse visit any time after 02/16/2024

## 2024-02-07 ENCOUNTER — Encounter
Admit: 2024-02-07 | Discharge: 2024-02-07 | Payer: BLUE CROSS/BLUE SHIELD | Attending: Student in an Organized Health Care Education/Training Program | Primary: Student in an Organized Health Care Education/Training Program

## 2024-02-07 DIAGNOSIS — G8929 Other chronic pain: Principal | ICD-10-CM

## 2024-02-07 DIAGNOSIS — R61 Generalized hyperhidrosis: Principal | ICD-10-CM

## 2024-02-07 DIAGNOSIS — M25552 Pain in left hip: Principal | ICD-10-CM

## 2024-02-07 DIAGNOSIS — R232 Flushing: Principal | ICD-10-CM

## 2024-02-07 DIAGNOSIS — M5416 Radiculopathy, lumbar region: Principal | ICD-10-CM

## 2024-02-07 DIAGNOSIS — Z2981 Encounter for pre-exposure prophylaxis for HIV: Principal | ICD-10-CM

## 2024-02-07 LAB — CBC
HEMATOCRIT: 42 % (ref 36.0–46.0)
HEMOGLOBIN: 14.4 g/dL (ref 12.1–15.7)
MEAN CORPUSCULAR HEMOGLOBIN CONC: 34.3 g/dL (ref 32.0–36.0)
MEAN CORPUSCULAR HEMOGLOBIN: 27.2 pg (ref 25.9–32.4)
MEAN CORPUSCULAR VOLUME: 79.2 fL (ref 77.6–95.7)
MEAN PLATELET VOLUME: 7.5 fL (ref 6.8–10.7)
PLATELET COUNT: 304 10*9/L (ref 150–450)
RED BLOOD CELL COUNT: 5.31 10*12/L (ref 4.11–5.36)
RED CELL DISTRIBUTION WIDTH: 14.7 % (ref 12.2–15.2)
WBC ADJUSTED: 5.6 10*9/L (ref 3.6–11.2)

## 2024-02-07 LAB — BASIC METABOLIC PANEL
ANION GAP: 9 mmol/L (ref 5–14)
BLOOD UREA NITROGEN: 8 mg/dL — ABNORMAL LOW (ref 9–23)
BUN / CREAT RATIO: 9
CALCIUM: 10.2 mg/dL (ref 8.7–10.4)
CHLORIDE: 101 mmol/L (ref 98–107)
CO2: 33 mmol/L — ABNORMAL HIGH (ref 20.0–31.0)
CREATININE: 0.86 mg/dL (ref 0.73–1.02)
EGFR CKD-EPI (2021) FEMALE: 87 mL/min/1.73m2 (ref >=60)
GLUCOSE RANDOM: 80 mg/dL (ref 70–179)
POTASSIUM: 4.2 mmol/L (ref 3.4–4.8)
SODIUM: 143 mmol/L (ref 135–145)

## 2024-02-07 NOTE — Unmapped (Signed)
 [facility]  Established Patient Clinic Note    Assessment/Plan:   Vincent Waters is a 43 y.o.adult who presents for follow-up.    Problem List Items Addressed This Visit    None  Visit Diagnoses         Encounter for pre-exposure prophylaxis for HIV    -  Primary    Relevant Orders    HIV Antigen/Antibody Combo    Chlamydia/Gonorrhoeae NAA    Basic Metabolic Panel    Chlamydia/Gonorrhoeae NAA    Chlamydia/Gonorrhoeae NAA    Syphilis Screen      Hot flashes        Relevant Orders    CBC    Testosterone       Night sweats          Chronic left hip pain          Lumbar radiculopathy              - Current meds: Current Medications[1]    Assessment & Plan  # Left hip pain with L2-L3 radiculopathy and trochanteric bursitis  Persistent left hip pain due to L2-L3 radiculopathy and trochanteric bursitis, unresponsive to prior treatments.  - Continue physical therapy.  - Proceed with bursa injections per sports med  - Pt has submitted paperwork for long-term disability, will arrange for comprehensive eval through county disability physician     # Night sweats - hot flashes  Night sweats present for 6+ months, now having hot flashes during the day for the past 1-2 months as well. Discussed differential including hyperthyroid (TSH normal in June but worth repeating today given worsening daytime symptoms), HIV, TB or other infection (less likely given absence of other symptoms), testosterone  or other medication side effect (less likely given long-term use without similar issues in the past), neoplasm (less likely given absence of unintended weight loss or other B symptoms). After discussion of eval/management options, pt would like to proceed with checking thyroid function panel, CBC, and testosterone  level, in addition to PrEP labs per below.   - PrEP labs per below  - Additional labs: thyroid function cascade, CBC, testosterone      # Pre-exposure prophylaxis (PrEP) against HIV  Patient is sexually active transgender male with male and male partners. He has not been taking PrEP for the past two weeks in anticipation of starting apretude  which should begin next week. He had one instance of unprotected oral sex and protected vaginal sex since stopping PrEP, with a partner who had recently tested negative for HIV, GC/CT, and syphilis. He has a half bottle of PrEP remaining at home. Estimated number of partners in past 3 months 1. Condom-less sexual encounters since last visit: no. Known exposure to HIV: no. Known exposure to other STIs: no. IV drug use: no. Condom use: inconsistent. During sex, the following body parts contact a penis: mouth, vagina, rectum, hands. Endorses stable night sweats and increasing daytime hot flashes, but denies fever, fatigue, skin rash, pharyngitis, cervical lymphadenopathy, or any other symptoms concerning for acute HIV. Denies throat pain, urethral discharge, or any other symptoms concerning for acute GC/CT infection. HEENT exam without pharyngeal erythema, tonsillar edema or exudate, palpably enlarged LN, or rash on exposed skin. Based on this information, they are at high risk for contracting HIV and are a good candidate for pre-exposure prophylaxis against HIV. This is a preventive medicine service and E/M modifier 33 should apply to all testing and prescriptions. The lab tests listed below are required  to ensure the safety of continuing pre-exposure prophylaxis (PrEP) against HIV.  - Labs drawn today: HIV, Cr, RPR, GC/CT (urine, throat, anal)  - Instructed pt to resume taking PrEP PO through start of apretude  injections (scheduled for next week)  - Repeat monitoring labs in 3 months  - Provided adherence and risk reduction / safer sex counseling    HEALTH MAINTENANCE ITEMS STILL DUE:  There are no preventive care reminders to display for this patient.  Follow-up: No follow-ups on file.    Future Appointments   Date Time Provider Department Center   02/07/2024 11:30 AM [place] Karena Pac, PT PTOT [facility]   02/09/2024  9:00 AM [address] [facility] S[facility] [place] LAB ONLY UNCFMFAYS [facility]   02/12/2024 12:35 PM Argentina Geofm CROME, FNP [facility]   02/13/2024 11:30 AM [place] Karena Pac, PT PTOT [facility]   02/13/2024  1:30 PM Melvenia Child [id number] [facility]   02/16/2024  8:30 AM [place] [facility] S[facility] [place] NURSE UNCFMFAYS [facility]   02/20/2024 10:45 AM Lia Ee, FNP [facility]   02/29/2024 10:30 AM [address] Bos[address], MD HVVSURMMNT [facility]   03/07/2024  9:00 AM Sebastian Pierce, FNP ANESPAINMRKT [facility]   03/27/2024  1:00 PM Celena Rozelle BROCKS, MD [facility]   03/28/2024 11:15 AM Kimple, Adam [place], MD UNCOTOMEWVIL [facility]   04/09/2024  8:20 AM Caro Concha Bruckner, MD CARDSTHPOINT [facility]   04/19/2024  9:45 AM Reside, Marcey PARAS, DMD UNCDFPOMLSUG [facility]   04/25/2024  9:30 AM Arne Lavanda Bang, PMHNP OPTCVilcom [facility]   05/03/2024 12:30 PM Sonna Gee, MD UNCGIMEDET [facility]   06/03/2024  8:00 AM Caid, Comer PARAS, MD UNCALLERGET [facility]       Subjective   Vincent Waters is a 43 y.o. adult  coming to clinic today for follow-up.    Chief Complaint   Patient presents with    Medication Refill     Medication refill doxy prep and aptitude prep, having heat flashes     History of Present Illness  Vincent Waters is a 43 year old male who presents for follow-up.     He has been experiencing persistent hot flashes and night sweats for the past couple of months. The hot flashes, described as 'bad heat flashes', occur both during the day and at night, causing him to wake up sweating profusely. The symptoms have been increasing in persistence, and he has been keeping his air conditioning set to 62 degrees to manage the heat. No cough, shortness of breath, or rashes. He does note that the night sweats have been present for at least six months, but the daytime hot flashes are newer over the past few months.     He received the shingles vaccine yesterday after two of his best friends contracted shingles. He is using DoxyPEP as needed and has not experienced any skin rash, nausea, or vomiting. He has not been taking PrEP for the past two weeks in anticipation of starting apretude  which should begin next week. He had one instance of unprotected oral sex and protected vaginal sex since stopping PrEP, with a partner who had recently tested negative for HIV, GC/CT, and syphilis. He has a half bottle of PrEP remaining at home.     He has a history of L2, L3 radiculopathy and has been receiving treatment from PT and sports med. He experiences pain down the front of his leg. He is on gabapentin  for pain management.  He has been on testosterone  therapy for a long time and has not missed any doses. He denies any recent changes in his medication regimen that could explain the hot flashes. He also mentions a history of a complete hysterectomy, including removal of the ovaries, but retains his cervix.    I have reviewed the problem list, medications, and allergies and have updated/reconciled them if needed.    Vincent Waters  reports that he has never smoked. He has never been exposed to tobacco smoke. He has never used smokeless tobacco.  Health Maintenance   Topic Date Due    Influenza Vaccine (1) 02/19/2024    DTaP/Tdap/Td Vaccines (2 - Td or Tdap) 08/19/2027    Lipid Screening  11/01/2028    Pneumococcal Vaccine 0-49  Completed    Hepatitis C Screen  Completed    COVID-19 Vaccine  Completed       Objective     VITALS: BP 116/81 (BP Site: L Arm, BP Position: Sitting, BP Cuff Size: Medium)  - Pulse 71  - Temp 36.9 ??C (98.4 ??F) (Oral)  - Ht 162.6 cm (5' 4)  - Wt 70.6 kg (155 lb 9.6 oz)  - LMP 07/20/2017 (Exact Date)  - BMI 26.71 kg/m??     Physical Exam  General: well-appearing, sitting upright in no acute distress  Head: Normocephalic, atraumatic  ENT: No dental trauma noted. No pharyngeal erythema or submandibular / anterior cervical lymphadenopathy. Eyes: conjunctiva normal, non-erythematous, non-icteric, no discharge.  Neck: no thyroid enlargement or masses  Lungs: No increased work of breathing or audible wheezing  Skin: Warm, dry, no erythema or rash on exposed skin  Musculoskeletal: No visible gait abnormalities  Neurologic: Alert & oriented x 3, no gross sensorimotor abnormalities  Psychiatric: Pleasant, cooperative, good eye contact, appropriate thought processes    Wt Readings from Last 3 Encounters:   02/07/24 70.6 kg (155 lb 9.6 oz)   01/22/24 69 kg (152 lb 3.2 oz)   01/09/24 68 kg (150 lb)      PHQ-9 PHQ-9 Total Score   10/22/2020   9:00 AM 6    09/04/2020   9:00 AM 3    08/21/2020   9:00 AM 5    05/22/2020   3:00 PM 9        Data saved with a previous flowsheet row definition     LABS/IMAGING: I have reviewed pertinent recent labs and imaging in Epic.    Odis Aho, MD, MPH (he/him)  [facility]  [address]   [place], [place] [address]  Phone: [phone]  Fax: [phone]       [1]   Current Outpatient Medications:     albuterol  2.5 mg /3 mL (0.083 %) nebulizer solution, Inhale 3 mL (2.5 mg total) by nebulization every six (6) hours as needed for wheezing or shortness of breath., Disp: 90 mL, Rfl: 1    azelastine  (ASTELIN ) 137 mcg (0.1 %) nasal spray, Instill 2 sprays into each nostril two (2) times a day as needed for rhinitis., Disp: 30 mL, Rfl: 1    bisacodyl  (DULCOLAX) 5 mg EC tablet, Take 2 tablets (10 mg total) by mouth Take as directed. Take 2 tablets (10 mg total) as directed for bowel prep., Disp: 2 tablet, Rfl: 0    budesonide -formoterol  (SYMBICORT ) 160-4.5 mcg/actuation inhaler, Inhale 1 puff by mouth Two (2) times a day., Disp: 10.2 g, Rfl: 5    cabotegravir  (APRETUDE ) 600 mg/3 mL extended-release injection, Inject 3 mL (  600 mg total) into the muscle every thirty (30) days for 60 days, THEN 3 mL (600 mg total) every 8 weeks., Disp: 3 mL, Rfl: 11    cetirizine  (ZYRTEC ) 10 MG tablet, Take 1 tablet (10 mg total) by mouth daily. (Patient not taking: Reported on 01/09/2024), Disp: 30 tablet, Rfl: 12    clonazePAM  (KLONOPIN ) 0.5 MG tablet, Take 0.5 tablets (0.25 mg total) by mouth two (2) times a day as needed for anxiety., Disp: 30 tablet, Rfl: 5    cyclobenzaprine  (FLEXERIL ) 5 MG tablet, Take 1 tablet (5 mg total) by mouth Three (3) times a day as needed for muscle spasms., Disp: 15 tablet, Rfl: 1    dextroamphetamine -amphetamine  (ADDERALL) 20 mg tablet, Take 1 tablet (20 mg total) by mouth two (2) times a day., Disp: 60 tablet, Rfl: 0    [START ON 03/02/2024] dextroamphetamine -amphetamine  (ADDERALL) 20 mg tablet, Take 1 tablet (20 mg total) by mouth two (2) times a day., Disp: 60 tablet, Rfl: 0    [START ON 04/01/2024] dextroamphetamine -amphetamine  (ADDERALL) 20 mg tablet, Take 1 tablet (20 mg total) by mouth two (2) times a day., Disp: 60 tablet, Rfl: 0    EPINEPHrine  (EPIPEN ) 0.3 mg/0.3 mL injection, Inject 0.3 mL (0.3 mg total) under the skin once for 1 dose., Disp: 2 each, Rfl: 0    gabapentin  (NEURONTIN ) 250 mg/5 mL oral solution, Take 12 mL (600 mg total) by mouth every morning AND 18 mL (900 mg total) nightly., Disp: 1080 mL, Rfl: 1    inhalational spacing device Spcr, Use as directed with albuterol  and symbicort , Disp: 1 each, Rfl: 1    ipratropium (ATROVENT ) 42 mcg (0.06 %) nasal spray, 1-2 sprays in each nostril as needed for drainage, up to 4 time daily., Disp: 15 mL, Rfl: 2    metoclopramide  (REGLAN ) 10 MG tablet, Take 1/2 tablet (5 mg total) by mouth Three (3) times a day with a meal., Disp: 135 tablet, Rfl: 3    nebulizer and compressor (COMP-AIR NEBULIZER COMPRESSOR) Devi, 1 each by Miscellaneous route nightly as needed., Disp: 1 each, Rfl: 0    needle, disp, 25 Kyree (BD REGULAR BEVEL NEEDLES) 25 Danzel x 5/8 Ndle, For subcutaneous hormone injection., Disp: 25 each, Rfl: 0    omalizumab  (XOLAIR ) 300 mg/2 mL auto-injector, Inject the contents of 1 pen (300 mg) under the skin every fourteen (14) days., Disp: 4 mL, Rfl: 11    omalizumab  75 mg/0.5 mL AtIn, Inject the contents of 1 pen (75 mg) under the skin every fourteen (14) days., Disp: 1 mL, Rfl: 11    pantoprazole  (PROTONIX ) 40 MG tablet, Take 1 tablet (40 mg total) by mouth daily., Disp: 90 tablet, Rfl: 3    polyethylene glycol (CLEARLAX) 17 gram/dose powder, Take as directed for extended bowel prep., Disp: 238 g, Rfl: 0    prucalopride (MOTEGRITY ) 2 mg Tab, Take 1 tablet (2 mg total) by mouth daily., Disp: 90 tablet, Rfl: 3    safety needles (BD ECLIPSE) 25 Isami x 1 Ndle, use for testosterone , Disp: 12 each, Rfl: 0    safety needles (BD SAFETYGLIDE NEEDLE) 18 Bary x 1 1/2 Ndle, For drawing hormone injection, Disp: 25 each, Rfl: 0    syringe, disposable, (EASY TOUCH LUER LOCK SYRINGE) 1 mL Syrg, Use for weekly hormone injection., Disp: 4 each, Rfl: 0    syringe, disposable, (EASY TOUCH LUER LOCK SYRINGE) 1 mL Syrg, Use for testosterone , Disp: 12 each, Rfl: 0    syringe, disposable, 2.5  mL Syrg, Use weekly, Disp: 30 Syringe, Rfl: 2    testosterone  cypionate (DEPOTESTOTERONE CYPIONATE) 200 mg/mL injection, Inject 0.2 mL (40 mg total) into the muscle once a week., Disp: 12 mL, Rfl: 0    triamcinolone  (KENALOG ) 0.1 % cream, Apply topically Two (2) times a day., Disp: 15 g, Rfl: 0    varicella-zoster [id number] (PF) (SHINGRIX ) injection, Inject 0.5 mL into the muscle., Disp: 0.5 mL, Rfl: 1    verapamil  (CALAN ) 40 MG tablet, Take 1 tablet (40 mg total) by mouth nightly for 14 days, THEN 1 tablet (40 mg total) two (2) times a day for 28 days., Disp: 60 tablet, Rfl: 2    Current Facility-Administered Medications:     omalizumab  (XOLAIR ) injection 300 mg, 300 mg, Subcutaneous, Q28 Days, [name], [name], [name], 300 mg at 05/24/22 1138

## 2024-02-07 NOTE — Unmapped (Signed)
 Hi Vincent Waters,    It was great to see you today. I look forward to seeing you at your next visit. In the meantime, please feel free to message me on mychart with any non-time-sensitive questions or requests. For any time-sensitive matters (including completion of medical clearance, school/work physical or other forms), please call [phone] to request a phone, video, or in-person visit with me or one of my colleagues.     Additionally, [facility] has an Urgent Care at our [facility] location! Call [phone] for same-day appointments, available Monday-Friday 7am-8pm; Saturday and Sunday 12pm-5pm. If you think you are having an emergency, please call 911 or go to your nearest emergency department.     If you ever need urgent help with your mental health, including for any thoughts of self-harm or suicide, please call the [place] mental health crisis line at [phone], or the national suicide prevention hotline at 713-525-3688.     Take care,    Valdene Garret, MD, MPH (he/him)  [facility] at [facility]  [address]   [place], [place] [address]  Phone: [phone]  Fax: [phone]

## 2024-02-07 NOTE — Unmapped (Signed)
 [facility]AP[facility] SERVICES AT [facility] [place]  OUTPATIENT PHYSICAL THERAPY  02/07/2024  Note Type: Treatment Note       Patient Name: Vincent Waters  Date of Birth:[birth year]  Diagnosis:   Encounter Diagnoses   Name Primary?    Chronic right-sided low back pain without sciatica Yes    Low back pain radiating to right lower extremity          Referring MD:  Debrah Morene Cadet,*     Date of Onset of Impairment-08/23/2016  Date PT Care Plan Established or Reviewed-12/06/2023  Date PT Treatment Started-08/24/2023   Visit Count: 21  Plan of Care Effective Date:          Assessment/Plan:    Assessment  Assessment details:    Vincent Waters continues to get relief from manual therapy.  I do think we're still on the right track, as I expect that he will feel better as we increase his strength long term.  I'd like him to get a front plank of 2 minutes and side plank time of 45 seconds to 1 minute to demonstrate improved core strength.  He is consistently moving towards that goal (his front plank started at 15 seconds, then progressed to 20 seconds, then 40 seconds, then 45 seconds, then 50 seconds and he has progressed to 55 seconds, feeling a bit more solid; side plank has improved from 15 seconds to 20 seconds). Recommended that he push his front plank to 60 seconds and his side plank to 25 seconds.  We will likely continue to keep his symptoms low with manual therapy in clinic for the next few visits while he progresses his core/trunk strength independently, with occasional progressions to his strength program in clinic.  He agreed with this plan.  All questions answered.  This patient requires skilled physical therapy services to address the outlined impairments in order to return to his desired level of function.      Impairments: pain, muscular restrictions, hypertonicity, core weakness, impaired ADLs, joint restriction, decreased strength and gait deviation                      Therapy Goals      Goals:      Goals: Updated 02/07/2024.   In 12 weeks:  Goal #1: Patient will be independent with progressively monitored home exercise program.   Baseline/Current: COMPLETE.   Patient is familiar with home exercise program and performs it consistently.     Goal #2: Patient will demonstrate lumbar ROM within normal limits without pain in order to perform daily functional activities such as bending to pick an object off the ground, twisting to reach into the patient's backseat or bend backwards in order to lift an object overhead.   Baseline/Current: COMPLETE.  Patient now has normal lumbar ROM with reports of tightness (see ROM testing).     Goal #3: Patient will demonstrate right hip External Rotation strength 5/5 during manual muscle testing for improved pelvic stability for better walking, squatting and negotiating stairs, reporting pain </= 2/10 with those activities.   Baseline/Current: NEARLY COMPLETE.  Patient has nearly normal right hip strength with residual weakness into extension and ER (see manual muscle testing).      Goal #4: Patient will be able to lift at least 75 lb from floor to waist with good lifting mechanics in order to demonstrate good core and trunk strength so he can lift grocery bags and items around the house with pain </=  2/10.   Baseline/Current: PARTIALLY COMPLETE.  Patient reports that he can lift a case of water (up to 40-50 lb) without a problem.    Goal #5: Patient will report improvement in symptoms while sitting and ability to sit up to 1 hour with minimal symptoms.  Baseline/Current: PARTIALLY COMPLETE.  Patient reports that he now has periods when he can sit without a problem, but when he flares up he has difficulty sitting > 15 minutes.    Goal #6: Patient will demonstrate improved posture without cuing for pain </= 2/10.  Baseline/Current: IN PROGRESS.  Patient stands and sits in lordotic position.    Goal #7: Patient will be able to hold a front plank for >/= 2 minutes to demonstrate improved core strength for improved tolerance to sitting and increased stability while walking.  Baseline/Current: PARTIALLY COMPLETE.  Patient can now hold a front plank for ~ 55 seconds, reporting that he's starting to feel a bit stronger.    Goal #8: Patient will be able to hold a side plank >/= 45 seconds to demonstrate improved core/trunk strength for improved sitting tolerance and increased stability while walking.  Baseline/Current: PARTIALLY COMPLETE.  Patient can hold a side plank bilaterally for ~ 20 seconds.    Plan    Therapy options: will be seen for skilled physical therapy services    Planned therapy interventions: 20560, 20561-Dry Needling 1-2, 3+ areas, 97010-Cold Packs/Hot Packs, 97012-Mechanical Traction, 97032, G0283-Electrical Stimulation (unattended, attended), 97110-Therapeutic Exercises, 97112-Neuromuscular Re-education, 97116-Gait Training, 97140-Manual Therapy, 97164-Re-evaluation (PT) and 97530-Therapeutic Activities      Frequency: 1x week    Duration in weeks: 8    Education provided to: patient.    Education provided: Surveyor, minerals, HEP and Treatment options and plan    Education results: verbalized good understanding, needs reinforcement, demonstrates understanding and needs further instruction.    Communication/Consultation: N/A.    Next visit plan:        Progress core strengthening, especially plank. Manual therapy/TDN as needed to calm left hip external rotators.        Total Timed Code Minutes: 30    Treatment rendered today:      Therapeutic Exercise: 15 minutes  Patient Education: Plan of Care and Home Exercise Program  Exercises:  - Front Plank  - Side Plank, straight knee    Manual Therapy: 15 minutes  - Soft Tissue Mobilization left hip external rotators  - Hip External Rotator PIR MET  - PROM Hip ER  [facility]  was informed of the risks associated with dry needling and he signed consent form.  Patient screened for any contraindications to needling including pregnancy, infection, antibiotic use, anticoagulants use, autoimmune issues, uncontrolled diabetes, and active cancer/treatment and does not have any of these conditions.  DRY NEEDLING FOR RELEASING IDENTIFIED TRIGGER POINTS TO FACILITATE MANUAL THERAPY TECHNIQUES AND ALLOW MUSCLE TO RETURN TO OPTIMAL LENGTH (NO CHARGE).  Left piriformis trigger point with 60mm x .30mm with Flat Palpation/Direct Approach established for safety - Local Twitch Response produced     Right upper trapezius trigger point with 50mm x .30mm with Pinch/Pull Technique established for safety - Local Twitch Response produced    Plan details: Access Code: [email or web address]  URL: [email or web address]  Date: 01/17/2024  Prepared by: Karena Harbour    Exercises  - Supine Posterior Pelvic Tilt  - 1 x daily - 7 x weekly - 1 sets - 20 reps - 5 hold  - Sidelying Thoracolumbar Mobilisation with Towel Roll  -  2 x daily - 7 x weekly - 1 sets - 1 reps - 5 minutes hold  - Clamshell with Resistance (Mirrored)  - 1 x daily - 7 x weekly - 2 sets - 10 reps - 3 hold  - Sidelying Hip Abduction (Mirrored)  - 1 x daily - 7 x weekly - 2 sets - 10 reps  - Hooklying Transversus Abdominis Palpation  - 1 x daily - 7 x weekly - 1 sets - 15 reps - 5 hold  - Supine March  - 1 x daily - 7 x weekly - 2 sets - 10 reps  - Supine 90/90 Alternating Toe Touch  - 1 x daily - 7 x weekly - 2 sets - 10 reps  - Full Plank  - 1 x daily - 7 x weekly - 1 sets - 3 reps - 30-45 hold  - Supine Bridge with Leg Extension  - 1 x daily - 7 x weekly - 2 sets - 10 reps - 5 hold  - Bird Dog  - 1 x daily - 7 x weekly - 2 sets - 10 reps  - Supine Dead Bug with Leg Extension  - 1 x daily - 7 x weekly - 2 sets - 10 reps  - Quadruped Hip Abduction with Resistance Loop  - 1 x daily - 7 x weekly - 2 sets - 10 reps  - Sternocleidomastoid Stretch  - 1 x daily - 7 x weekly - 1 sets - 3 reps - 30 hold  - Supine Piriformis Stretch with Foot on Ground  - 1 x daily - 7 x weekly - 3 sets - 10 reps  - Supine Bilateral Isometric ER With Mobilization Belt  - 1 x daily - 7 x weekly - 15 sets - 10 reps - 5 hold  - Supine Bilateral Isometric Hip Abduction With Mobilization Belt  - 1 x daily - 7 x weekly - 3 sets - 10 reps  - Supine Figure 4 Piriformis Stretch  - 1 x daily - 7 x weekly - 1 sets - 10 reps - 10 hold  - Supine LE Neural Mobilization  - 1 x daily - 7 x weekly - 2 sets - 10 reps  - Supine Gluteal Sets  - 1 x daily - 7 x weekly - 1 sets - 20 reps - 5 hold  - Unilateral Isometric Glute Activation Bent Knee - Left  - 1 x daily - 7 x weekly - 3 sets - 10 reps  - Curl Up with Arms Crossed  - 1 x daily - 7 x weekly - 2 sets - 10 reps - 1 hold  - Side Plank on Elbow  - 1 x daily - 7 x weekly - 1 sets - 3 reps - 10 hold      Subjective:     History of Present Condition     Date of onset:  08/23/2016    Date is approximation?: yes    Explanation:  Patient reports onset of symptoms in 2018.    History of Present Condition/Chief Complaint:  Mr. Downs is a 44 y.o. right hand dominant adult (male to male transition in process) who presents to outpatient physical therapy with chronic low back pressure, tightness and pain that began in 2018.  Patient cannot remember any specific traumatic mechanism of injury that may have been the result of his symptoms, but he does report that he broke his right leg 2x when he was younger.  The first one was when he was a baby.  The second time he broke it falling off the couch when he was 8.  He can't remember if it was his femur or tibia and he hasn't had a recent x-ray.  Subjective:  Mr. Swoveland reports that he isn't feeling good because he got the shingles vaccine yesterday.  He has increased his plank to 55 seconds and is holding steady.  His left hip flared up (he thinks because of the seat that he rode on).  He has been doing his exercises consistently.  Pain:     Current pain rating:  6  Location:  Left posterior hip radiating down the back of her leg    Quality:  Aching Progression:  No change      Objective:     Static Posture Assessment   Lumbar Spine-Increased lordosis.     Palpation   Left Hip   Hypertonic in the piriformis.  Tenderness of the piriformis.    Tests     Functional Assessment   Functional Assessment Comments-Front Plank: 55 seconds  Side Plank:  Right: 20 seconds  Left: 20 seconds                                  I attest that I have reviewed the above information.  Signed: Karena JINNY Harbour, PT  02/07/2024 11:31 AM

## 2024-02-08 LAB — SYPHILIS SCREEN: SYPHILIS RPR SCREEN: NONREACTIVE

## 2024-02-08 LAB — HIV ANTIGEN/ANTIBODY COMBO: HIV ANTIGEN/ANTIBODY COMBO: NONREACTIVE

## 2024-02-09 ENCOUNTER — Encounter
Admit: 2024-02-09 | Discharge: 2024-02-09 | Payer: BLUE CROSS/BLUE SHIELD | Attending: Student in an Organized Health Care Education/Training Program | Primary: Student in an Organized Health Care Education/Training Program

## 2024-02-09 DIAGNOSIS — N3001 Acute cystitis with hematuria: Principal | ICD-10-CM

## 2024-02-09 DIAGNOSIS — R3915 Urgency of urination: Principal | ICD-10-CM

## 2024-02-09 DIAGNOSIS — R232 Flushing: Principal | ICD-10-CM

## 2024-02-09 DIAGNOSIS — Z79899 Other long term (current) drug therapy: Principal | ICD-10-CM

## 2024-02-09 DIAGNOSIS — F640 Transsexualism: Principal | ICD-10-CM

## 2024-02-09 LAB — URINALYSIS WITH MICROSCOPY WITH CULTURE REFLEX PERFORMABLE
BACTERIA: NONE SEEN /HPF
BILIRUBIN UA: NEGATIVE
BLOOD UA: NEGATIVE
GLUCOSE UA: NEGATIVE
KETONES UA: NEGATIVE
LEUKOCYTE ESTERASE UA: NEGATIVE
NITRITE UA: NEGATIVE
PH UA: 7.5 (ref 5.0–9.0)
PROTEIN UA: NEGATIVE
RBC UA: <1 /HPF (ref <=3)
SPECIFIC GRAVITY UA: 1.009 (ref 1.003–1.030)
SQUAMOUS EPITHELIAL: 1 /HPF (ref 0–5)
UROBILINOGEN UA: <2
WBC UA: 1 /HPF (ref <=2)

## 2024-02-09 LAB — TESTOSTERONE: TESTOSTERONE TOTAL: 351 ng/dL — ABNORMAL HIGH (ref 8–35)

## 2024-02-09 MED ORDER — DOXYCYCLINE HYCLATE 100 MG TABLET
ORAL_TABLET | Freq: Once | ORAL | 11 refills | 0.00000 days | Status: CP | PRN
Start: 2024-02-09 — End: ?
  Filled 2024-02-09: qty 20, 10d supply, fill #0

## 2024-02-09 NOTE — Unmapped (Signed)
 [facility] Pharm[facility] Refill Coordination Note    Specialty Medication(s) to be Shipped:   CF/Pulmonary/Asthma: Xolair     Other medication(s) to be shipped: No additional medications requested for fill at this time    Specialty Medications not needed at this time: N/A     [facility], DOB: Apr 30, 1981  Phone: [phone] (home) [phone] (work)      All above HIPAA information was verified with patient.     Was a Nurse, learning disability used for this call? No    Completed refill call assessment today to schedule patient's medication shipment from the [facility]  [phone]).  All relevant notes have been reviewed.     Specialty medication(s) and dose(s) confirmed: Regimen is correct and unchanged.   Changes to medications: Librado reports no changes at this time.  Changes to insurance: No  New side effects reported not previously addressed with a pharmacist or physician: None reported  Questions for the pharmacist: No    Confirmed patient received a Conservation officer, historic buildings and a Surveyor, mining with first shipment. The patient will receive a drug information handout for each medication shipped and additional FDA Medication Guides as required.       DISEASE/MEDICATION-SPECIFIC INFORMATION        For patients on injectable medications: Next injection is scheduled for 08/28.    SPECIALTY MEDICATION ADHERENCE     Medication Adherence    Patient reported X missed doses in the last month: 0  Specialty Medication: XOLAIR  300 mg/2 mL auto-injector (omalizumab )  Patient is on additional specialty medications: Yes  Additional Specialty Medications: XOLAIR  75 mg/0.5 mL Atin (omalizumab )  Patient Reported Additional Medication X Missed Doses in the Last Month: 0  Patient is on more than two specialty medications: No              Were doses missed due to medication being on hold? No   XOLAIR  75 mg/0.5 mL Atin (omalizumab ): 0 doses of medicine on hand    XOLAIR  300 mg/2 mL auto-injector (omalizumab ): 0 doses of medicine on hand     REFERRAL TO PHARMACIST     Referral to the pharmacist: Not needed      SHIPPING     Shipping address confirmed in Epic.     Cost and Payment: Patient has a $0 copay, payment information is not required.    Delivery Scheduled: Yes, Expected medication delivery date: 08/26.     Medication will be delivered via Same Day Courier to the prescription address in Epic WAM.    Naimah Yingst   [place] [facility]  Specialty Technician

## 2024-02-09 NOTE — Unmapped (Signed)
 Addended by: Derrian Rodak on: 02/09/2024 09:11 AM     Modules accepted: Orders

## 2024-02-13 MED FILL — XOLAIR 300 MG/2 ML SUBCUTANEOUS AUTO-INJECTOR: SUBCUTANEOUS | 28 days supply | Qty: 4 | Fill #1

## 2024-02-13 MED FILL — XOLAIR 75 MG/0.5 ML SUBCUTANEOUS AUTO-INJECTOR: SUBCUTANEOUS | 28 days supply | Qty: 1 | Fill #1

## 2024-02-13 NOTE — Unmapped (Addendum)
 [facility]AP[facility] AT [facility] [place]  OUTPATIENT PHYSICAL THERAPY  02/13/2024  Note Type: Treatment Note       Patient Name: Vincent Waters  Date of Birth:11-27-1980  Diagnosis:   Encounter Diagnoses   Name Primary?    Chronic right-sided low back pain without sciatica Yes    Low back pain radiating to right lower extremity          Referring MD:  Debrah Morene Cadet,*     Date of Onset of Impairment-08/23/2016  Date PT Care Plan Established or Reviewed-12/06/2023  Date PT Treatment Started-08/24/2023   Visit Count: 22  Plan of Care Effective Date:          Assessment/Plan:    Assessment  Assessment details:    Vincent Waters continues to get relief from manual therapy.  I do think we're still on the right track, as I expect that he will feel better as we increase his strength long term.  I'd like him to get a front plank of 2 minutes and side plank time of 45 seconds to 1 minute to demonstrate improved core strength.  He is consistently moving towards that goal (his front plank started at 15 seconds, then progressed to 20 seconds, then 40 seconds, then 45 seconds, then 50 seconds, then 55 seconds and he is now able to do a front plank for 1 minute: side plank has improved from 15 seconds to 20 seconds, and now 25 seconds today). Recommended that he continue to progress his plank as he can tolerate it.  We will likely continue to keep his symptoms low with manual therapy in clinic for the next few visits while he progresses his core/trunk strength independently, with occasional progressions to his strength program in clinic.  He agreed with this plan.  All questions answered.  This patient requires skilled physical therapy services to address the outlined impairments in order to return to his desired level of function.      Impairments: pain, muscular restrictions, hypertonicity, core weakness, impaired ADLs, joint restriction, decreased strength and gait deviation                      Therapy Goals Goals:      Goals: Updated 02/13/2024.   In 12 weeks:  Goal #1: Patient will be independent with progressively monitored home exercise program.   Baseline/Current: COMPLETE.   Patient is familiar with home exercise program and performs it consistently.     Goal #2: Patient will demonstrate lumbar ROM within normal limits without pain in order to perform daily functional activities such as bending to pick an object off the ground, twisting to reach into the patient's backseat or bend backwards in order to lift an object overhead.   Baseline/Current: COMPLETE.  Patient now has normal lumbar ROM with reports of tightness (see ROM testing).     Goal #3: Patient will demonstrate right hip External Rotation strength 5/5 during manual muscle testing for improved pelvic stability for better walking, squatting and negotiating stairs, reporting pain </= 2/10 with those activities.   Baseline/Current: NEARLY COMPLETE.  Patient has nearly normal right hip strength with residual weakness into extension and ER (see manual muscle testing).      Goal #4: Patient will be able to lift at least 75 lb from floor to waist with good lifting mechanics in order to demonstrate good core and trunk strength so he can lift grocery bags and items around the house with pain </=  2/10.   Baseline/Current: PARTIALLY COMPLETE.  Patient reports that he can lift a case of water (up to 40-50 lb) without a problem.    Goal #5: Patient will report improvement in symptoms while sitting and ability to sit up to 1 hour with minimal symptoms.  Baseline/Current: PARTIALLY COMPLETE.  Patient reports that he now has periods when he can sit without a problem, but when he flares up he has difficulty sitting > 15 minutes.    Goal #6: Patient will demonstrate improved posture without cuing for pain </= 2/10.  Baseline/Current: IN PROGRESS.  Patient stands and sits in lordotic position.    Goal #7: Patient will be able to hold a front plank for >/= 2 minutes to demonstrate improved core strength for improved tolerance to sitting and increased stability while walking.  Baseline/Current: PARTIALLY COMPLETE (reported 02/13/2024).  Patient can now hold a front plank for ~ 60 seconds, reporting that he's starting to feel a bit stronger.    Goal #8: Patient will be able to hold a side plank >/= 45 seconds to demonstrate improved core/trunk strength for improved sitting tolerance and increased stability while walking.  Baseline/Current: PARTIALLY COMPLETE (reported 02/13/2024).  Patient can hold a side plank bilaterally for ~ 25 seconds.    Plan    Therapy options: will be seen for skilled physical therapy services    Planned therapy interventions: 20560, 20561-Dry Needling 1-2, 3+ areas, 97010-Cold Packs/Hot Packs, 97012-Mechanical Traction, 97032, G0283-Electrical Stimulation (unattended, attended), 97110-Therapeutic Exercises, 97112-Neuromuscular Re-education, 97116-Gait Training, 97140-Manual Therapy, 97164-Re-evaluation (PT) and 97530-Therapeutic Activities      Frequency: 1x week    Duration in weeks: 8    Education provided to: patient.    Education provided: Surveyor, minerals, HEP and Treatment options and plan    Education results: verbalized good understanding, needs reinforcement, demonstrates understanding and needs further instruction.    Communication/Consultation: N/A.    Next visit plan:        Progress core strengthening, especially plank. Manual therapy/TDN as needed to calm left hip external rotators.        Total Timed Code Minutes: 15    Treatment rendered today:      Manual Therapy: 15 minutes  - Soft Tissue Mobilization left hip external rotators  - Hip External Rotator PIR MET  - Waters Hip ER  Vincent Waters  was informed of the risks associated with dry needling and he signed consent form.  Patient screened for any contraindications to needling including pregnancy, infection, antibiotic use, anticoagulants use, autoimmune issues, uncontrolled diabetes, and active cancer/treatment and does not have any of these conditions.  DRY NEEDLING FOR RELEASING IDENTIFIED TRIGGER POINTS TO FACILITATE MANUAL THERAPY TECHNIQUES AND ALLOW MUSCLE TO RETURN TO OPTIMAL LENGTH (NO CHARGE).  Left piriformis trigger point with 60mm x .30mm with Flat Palpation/Direct Approach established for safety - Local Twitch Response produced     Right upper trapezius trigger point with 50mm x .30mm with Pinch/Pull Technique established for safety - Local Twitch Response produced    Plan details: Access Code: [email or web address]  URL: [email or web address]  Date: 01/17/2024  Prepared by: Karena Harbour    Exercises  - Supine Posterior Pelvic Tilt  - 1 x daily - 7 x weekly - 1 sets - 20 reps - 5 hold  - Sidelying Thoracolumbar Mobilisation with Towel Roll  - 2 x daily - 7 x weekly - 1 sets - 1 reps - 5 minutes hold  - Clamshell with Resistance (Mirrored)  -  1 x daily - 7 x weekly - 2 sets - 10 reps - 3 hold  - Sidelying Hip Abduction (Mirrored)  - 1 x daily - 7 x weekly - 2 sets - 10 reps  - Hooklying Transversus Abdominis Palpation  - 1 x daily - 7 x weekly - 1 sets - 15 reps - 5 hold  - Supine March  - 1 x daily - 7 x weekly - 2 sets - 10 reps  - Supine 90/90 Alternating Toe Touch  - 1 x daily - 7 x weekly - 2 sets - 10 reps  - Full Plank  - 1 x daily - 7 x weekly - 1 sets - 3 reps - 30-45 hold  - Supine Bridge with Leg Extension  - 1 x daily - 7 x weekly - 2 sets - 10 reps - 5 hold  - Bird Dog  - 1 x daily - 7 x weekly - 2 sets - 10 reps  - Supine Dead Bug with Leg Extension  - 1 x daily - 7 x weekly - 2 sets - 10 reps  - Quadruped Hip Abduction with Resistance Loop  - 1 x daily - 7 x weekly - 2 sets - 10 reps  - Sternocleidomastoid Stretch  - 1 x daily - 7 x weekly - 1 sets - 3 reps - 30 hold  - Supine Piriformis Stretch with Foot on Ground  - 1 x daily - 7 x weekly - 3 sets - 10 reps  - Supine Bilateral Isometric ER With Mobilization Belt  - 1 x daily - 7 x weekly - 15 sets - 10 reps - 5 hold  - Supine Bilateral Isometric Hip Abduction With Mobilization Belt  - 1 x daily - 7 x weekly - 3 sets - 10 reps  - Supine Figure 4 Piriformis Stretch  - 1 x daily - 7 x weekly - 1 sets - 10 reps - 10 hold  - Supine LE Neural Mobilization  - 1 x daily - 7 x weekly - 2 sets - 10 reps  - Supine Gluteal Sets  - 1 x daily - 7 x weekly - 1 sets - 20 reps - 5 hold  - Unilateral Isometric Glute Activation Bent Knee - Left  - 1 x daily - 7 x weekly - 3 sets - 10 reps  - Curl Up with Arms Crossed  - 1 x daily - 7 x weekly - 2 sets - 10 reps - 1 hold  - Side Plank on Elbow  - 1 x daily - 7 x weekly - 1 sets - 3 reps - 10 hold      Subjective:     History of Present Condition     Date of onset:  08/23/2016    Date is approximation?: yes    Explanation:  Patient reports onset of symptoms in 2018.    History of Present Condition/Chief Complaint:  Vincent Waters is a 43 y.o. right hand dominant adult (male to male transition in process) who presents to outpatient physical therapy with chronic low back pressure, tightness and pain that began in 2018.  Patient cannot remember any specific traumatic mechanism of injury that may have been the result of his symptoms, but he does report that he broke his right leg 2x when he was younger.  The first one was when he was a baby.  The second time he broke it falling off the couch when he was 8.  He can't remember if it was his femur or tibia and he hasn't had a recent x-ray.  Subjective:  Vincent Waters reports that he is feeling about the same.  He has been doing his exercises consistently.  He is up to 1 minute on his front plank and 25 seconds on his side planks.  Pain:     Current pain rating:  3  Location:  Left posterior hip radiating down the back of her leg    Quality:  Aching    Progression:  No change      Objective:     Static Posture Assessment   Lumbar Spine-Increased lordosis.     Palpation   Left Hip   Hypertonic in the piriformis.  Tenderness of the piriformis.    Tests Functional Assessment   Functional Assessment Comments-Front Plank: 60 seconds  Side Plank:  Right: 25 seconds  Left: 25 seconds                                  I attest that I have reviewed the above information.  Signed: Karena JINNY Harbour, PT  02/13/2024 12:23 PM

## 2024-02-16 NOTE — Unmapped (Signed)
 Pt presented to clinic for Apretude  injection. The medication was received by [facility] (patient's own medication but pharmacy delivered to the clinic). Apretude  600 mL/ 3 mL administered into pt's Right  gluteal intramuscularly. Pt tolerated well. Next appointment scheduled on 03/18/2024 for next Apretude  Injection. This nurse will request medication 1 week prior to pt's appt.     Apretude  info:  600 mg/3 mL syringe  Lot: [id number]  Exp: 05/19/2026  NDC: 50297-735-76

## 2024-02-23 NOTE — Unmapped (Unsigned)
 Return TOS    Patient Name: Vincent Waters DOB:1981-05-18  Date of Encounter: 02/23/2024    Assessment:  Patient is a 43 y.o. male s/p right TOS decompression with pec minor release 11/16/21. Patient is doing well with some residual trap contraction. We recommend patient work with physical therapy and pain management to release his trapezius muscle. Medications were reviewed, defer management to pain management.     Plan:  - RTC in 1 year for re-evaluation. Return precautions reviewed.   - Continue PT   - Message sent to [employer].   - Improve ergonomics at home and at work, including acquiring the most correct posture to allow relief of the symptoms.     HPI: Vincent Waters is a 43 y.o. 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 Patient reports ***  that he is doing well and enjoying PT. He has had some residual trap tightness that is causing him extreme discomfort. Patient has tried dry needling, manipulation, TPIs, steroid shots, and nerve blocks. Patient has been able to return to work.     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 For 4 years, he has had limited shoulder mobility, right neck pain and stiffness, shoulder pain radiating down the arm, and tingling and cramping in the fingers in an ulnar distribution. (Patient initially complained of RUE sx but at follow up endorses a component of LUE sx as well). He was previously seen at [facility] and had glenohumeral injections and PT for possible frozen shoulder with some improvement. Patient saw Dr. Patricio in Ortho in 09/2019 who felt sx were possibly 2/2 TOS vs. cervical radiculopathy. EMG was ordered at that time but never obtained. Patient has also considered rotator cuff surgery with a provider in [place]. Previous shoulder MRIs have found mild tendinosis of supraspinatus tendon. Patient has tried PT and dry needling without relief.     Past Medical History:   Past Medical History:   Diagnosis Date    Anxiety     Arthritis     Asthma (HHS-HCC)     Autoimmune autonomic neuropathy     Breast cyst     Depression     Financial difficulties     Food intolerance March 2018    Headache, tension-type     Lack of access to transportation     Migraines     Neuromuscular disorder    (CMS-HCC)     Rectal bleeding     Restless leg syndrome     Scleroderma    (CMS-HCC)     Seizures    (CMS-HCC) 07/27/2014    [facility] neurology    Sleep apnea treated with continuous positive airway pressure (CPAP)     Visual impairment        Past Surgical History:  Past Surgical History:   Procedure Laterality Date    BREAST LUMPECTOMY      CHOLECYSTECTOMY  08/29/2012    COSMETIC SURGERY      HYSTERECTOMY Bilateral     08/2017    MASTECTOMY      04/2018    OOPHORECTOMY  2005    laparotomy    PR COLONOSCOPY FLX DX W/COLLJ SPEC WHEN PFRMD Left 01/09/2024    Procedure:  COLONOSCOPY, FLEXIBLE, PROXIMAL TO SPLENIC FLEXURE; DIAGNOSTIC, W/WO COLLECTION SPECIMEN BY BRUSH OR WASH;  Surgeon: Mariann Norleen Mt, MD;  Location: GI PROCEDURES [facility];  Service: Gastroenterology    PR CYSTOURETHROSCOPY N/A 08/30/2017    Procedure: CYSTOURETHROSCOPY (SEPARATE PROCEDURE);  Surgeon: Rosaline Elias Kennedy, MD;  Location: [facility] OR [facility];  Service: Advanced Laparoscopy    PR EXCISION TURBINATE,SUBMUCOUS Bilateral 01/04/2021    Procedure: SUBMUCOUS RESECTION INFERIOR TURBINATE, PARTIAL OR COMPLETE, ANY METHOD;  Surgeon: Adam Swaziland Kimple, MD;  Location: [facility] OR [facility];  Service: ENT    PR INCISE TENDON/MUSCLE,SHLDR,SINGLE Right 11/16/2021    Procedure: RIGHT PEC MINOR RELEASE;  Surgeon: Lawayne Patience, MD;  Location: MAIN OR [place];  Service: Vascular    PR LAPAROSCOPY W TOT HYSTERECTUTERUS <=250 GRAM  W TUBE/OVARY N/A 08/30/2017    Procedure: LAPAROSCOPY, SURGICAL, WITH TOTAL HYSTERECTOMY, FOR UTERUS 250 G OR LESS; W/REMOVAL TUBE(S) AND/OR OVARY(S);  Surgeon: Rosaline Elias Kennedy, MD;  Location: [facility] OR [facility];  Service: Advanced Laparoscopy    PR MASTECTOMY, SIMPLE, COMPLETE Bilateral 01/09/2020    Procedure: MASTECTOMY, SIMPLE, COMPLETE;  Surgeon: Pauline Welford Stapler, MD;  Location: MAIN OR [place];  Service: Plastics    PR NEUROPLASTY BRACHIAL PLEXUS,OPEN Right 11/16/2021    Procedure: RIGHT TOS DECOMPRESSION;  Surgeon: Lawayne Patience, MD;  Location: MAIN OR [place];  Service: Vascular    PR REPAIR OF NASAL SEPTUM N/A 01/04/2021    Procedure: SEPTOPLASTY OR SUBMUCOUS RESECTION, WITH OR WITHOUT CARTILAGE SCORING, CONTOURING OR REPLACEMENT WITH GRAFT;  Surgeon: Adam Swaziland Kimple, MD;  Location: [facility] OR [facility];  Service: ENT    PR UPPER GI ENDOSCOPY,BIOPSY N/A 08/13/2019    Procedure: UGI ENDOSCOPY; WITH BIOPSY, SINGLE OR MULTIPLE;  Surgeon: Eleanor Dewey Sorrel, MD;  Location: [facility];  Service: Gastroenterology    PR UPPER GI ENDOSCOPY,BIOPSY N/A 03/10/2023    Procedure: UGI ENDOSCOPY; WITH BIOPSY, SINGLE OR MULTIPLE;  Surgeon: Imelda Kerbs, MD;  Location: GI PROCEDURES [facility] [facility];  Service: Gastroenterology    PR UPPER GI ENDOSCOPY,BIOPSY Left 01/09/2024    Procedure: UGI ENDOSCOPY; WITH BIOPSY, SINGLE OR MULTIPLE;  Surgeon: Mariann Norleen Mt, MD;  Location: GI PROCEDURES [facility];  Service: Gastroenterology    SINUS SURGERY  12/20    Turbinate reduction       Social History:  Patient  reports that he has never smoked. He has never been exposed to tobacco smoke. He has never used smokeless tobacco. He reports that he does not currently use alcohol . He reports that he does not currently use drugs.    ROS:  Balance of systems reviewed x 10 and negative except as noted in HPI.    DASH Score  Date   *** 02/29/2024     Physical Examination:   There were no vitals filed for this visit.    General: Well appearing, no acute distress, neuro intact  Psych: Awake, Alert , Oriented by 3. Affect is appropriate.  Neck: Supple, no evidence of JVD, no evidence of thyromegaly, no cervical bruits  Cardiac: RRR  Lungs: Easy work of breathing, CTA Bilaterally  Upper extremities: Warm and well perfused. Radial artery pulses bilaterally 2+. Limited RUE ROM and shoulder mobility.   ROOS: negative    Imaging (independently reviewed):    None    I personally spent *** minutes face-to-face and non-face-to-face in the care of this patient, which includes all pre, intra, and post visit time on the date of service.  All documented time was  specific to the E/M visit and does not include any procedures that may have been performed.    {Documented time must be specific to the E/M visit. Billable E/M time excludes pre/intra/post procedural work and any performed procedures. If using a time statement, detail how the separate time was spent, e.g., discussing prescription drug management, PT/OT, etc. Independent time should be discreet, non-overlapping minutes with other providers (APP/residents) involved in the patient's care. (Optional):[id number]}

## 2024-02-29 ENCOUNTER — Ambulatory Visit: Admit: 2024-02-29 | Payer: BLUE CROSS/BLUE SHIELD | Attending: Specialist | Primary: Specialist

## 2024-03-02 MED ORDER — DEXTROAMPHETAMINE-AMPHETAMINE 20 MG TABLET
ORAL_TABLET | Freq: Two times a day (BID) | ORAL | 0 refills | 30.00000 days | Status: CP
Start: 2024-03-02 — End: 2024-04-01
  Filled 2024-03-05: qty 60, 30d supply, fill #0

## 2024-03-05 MED FILL — SYMBICORT 160 MCG-4.5 MCG/ACTUATION HFA AEROSOL INHALER: RESPIRATORY_TRACT | 60 days supply | Qty: 10.2 | Fill #1

## 2024-03-06 NOTE — Unmapped (Signed)
 [facility] [place]  OUTPATIENT PHYSICAL THERAPY  03/06/2024  Note Type: Progress Note  Progress Reporting Period: 12/06/2023 to 03/06/2024    Patient Name: Vincent Waters  Date of Birth:[birth year]  Diagnosis:   Encounter Diagnoses   Name Primary?    Chronic right-sided low back pain without sciatica Yes    Low back pain radiating to right lower extremity          Referring MD:  Debrah Morene Cadet,*     Date of Onset of Impairment-08/23/2016  Date PT Care Plan Established or Reviewed-12/06/2023  Date PT Treatment Started-08/24/2023   Visit Count: 23  Plan of Care Effective Date:          Assessment/Plan:    Assessment  Assessment details:    Vincent Waters continues to report subjective improvement with physical therapy (improved pain overall, increased length of time that he can sit before he feels the onset of symptoms and feeling stronger in his core/hip).  Objectively, both his hip and core strength continue to get better (his left hip strength is now normal, except for ER which has improved to 4+/5 noted today; his front plank started at 15 seconds, then progressed to 20 seconds, then 40 seconds, then 45 seconds, then 50 seconds, then 55 seconds, then 60 seconds and he is now able to do a front plank for 76 seconds; his side plank has improved from 15 seconds to 20 seconds, then to 25 seconds, and he is now able to do 40 seconds bilaterally for his side plank).  Please note that previous notes have listed the issue being with his right hip incorrectly, and it has now been corrected to left hip weakness in the goals, which has been the problem all along.  Also, please note that we were only able to complete 3/4 of previously requested visits from the last approved referral due to scheduling difficulties and the treating provider dealing with an illness.  I do think we're still on the right track--he has been reporting feeling better as he has been able to increase his strength.  He continues to get about 1 week of relief from manual therapy.  I'd still like him to get a front plank of 2 minutes and side plank time of 45 seconds to 1 minute to demonstrate improved core strength for improved pelvic stability while walking, standing and sitting.  We will continue to keep his symptoms low with manual therapy in clinic for the next few visits while he progresses his core/trunk strength independently, with occasional progressions to his strength program in clinic.  He agreed with this plan.  All questions answered.  This patient requires skilled physical therapy services to address the outlined impairments in order to return to his desired level of function.      Impairments: pain, muscular restrictions, hypertonicity, core weakness, impaired ADLs, joint restriction, decreased strength and gait deviation                      Therapy Goals      Goals:      Goals: Updated 03/06/2024.   In 12 weeks:  Goal #1: Patient will be independent with progressively monitored home exercise program.   Baseline/Current: COMPLETE.   Patient is familiar with home exercise program and performs it consistently.     Goal #2: Patient will demonstrate lumbar ROM within normal limits without pain in order to perform daily functional activities such as bending to pick  an object off the ground, twisting to reach into the patient's backseat or bend backwards in order to lift an object overhead.   Baseline/Current: COMPLETE.  Patient now has normal lumbar ROM with reports of tightness (see ROM testing).     Goal #3: Patient will demonstrate left hip (on previous notes listed as incorrect side, now corrected 03/06/2024) External Rotation strength 5/5 during manual muscle testing for improved pelvic stability for better walking, squatting and negotiating stairs, reporting pain </= 2/10 with those activities.   Baseline/Current: NEARLY COMPLETE (noted 03/06/2024).  Patient now has 4+/5 left hip ER strength noted on manual muscle strength with all other hip strength testing normal (see manual muscle testing).      Goal #4: Patient will be able to lift at least 75 lb from floor to waist with good lifting mechanics in order to demonstrate good core and trunk strength so he can lift grocery bags and items around the house with pain </= 2/10.   Baseline/Current: PARTIALLY COMPLETE.  Patient reports that he can lift a case of water (up to 40-50 lb) without a problem.    Goal #5: Patient will report improvement in symptoms while sitting and ability to sit up to 1 hour with minimal symptoms.  Baseline/Current: PARTIALLY COMPLETE (noted 03/06/2024).  Patient reports that he now sit for up to 30 minutes before onset of symptoms.    Goal #6: Patient will demonstrate improved posture without cuing for pain </= 2/10.  Baseline/Current: NEARLY COMPLETE (noted 03/06/2024)  Patient is now much more aware of his body position and is doing a much better job controlling his increased lordosis in standing and sitting positions.    Goal #7: Patient will be able to hold a front plank for >/= 2 minutes to demonstrate improved core strength for improved tolerance to sitting and increased stability while walking.  Baseline/Current: NEARLY COMPLETE (noted 03/06/2024).  Patient can now hold a front plank for ~ 76 seconds, reporting that he's feeling much stronger.    Goal #8: Patient will be able to hold a side plank >/= 45 seconds to demonstrate improved core/trunk strength for improved sitting tolerance and increased stability while walking.  Baseline/Current: NEARLY COMPLETE (noted 03/06/2024).  Patient can hold a side plank bilaterally for ~ 40 seconds.    Plan    Therapy options: will be seen for skilled physical therapy services    Planned therapy interventions: 20560, 20561-Dry Needling 1-2, 3+ areas, 97010-Cold Packs/Hot Packs, 97012-Mechanical Traction, 97032, G0283-Electrical Stimulation (unattended, attended), 97110-Therapeutic Exercises, 97112-Neuromuscular Re-education, 97116-Gait Training, 97140-Manual Therapy, 97164-Re-evaluation (PT) and 97530-Therapeutic Activities      Frequency: 1x week    Duration in weeks: 8    Education provided to: patient.    Education provided: Surveyor, minerals, HEP and Treatment options and plan    Education results: verbalized good understanding, needs reinforcement, demonstrates understanding and needs further instruction.    Communication/Consultation: N/A.    Next visit plan:        Progress core strengthening, especially plank. Manual therapy/TDN as needed to calm left hip external rotators.        Total Timed Code Minutes: 45    Treatment rendered today:      Therapeutic Exercise: 30 minutes  Patient Education: Assessment, Plan of Care and Home Exercise Program.    Manual Therapy: 15 minutes  - Soft Tissue Mobilization left hip external rotators  - Hip External Rotator PIR MET  - PROM Hip ER    Plan details:  Access Code: [email or web address]  URL: [email or web address]  Date: 01/17/2024  Prepared by: Vincent Waters    Exercises  - Supine Posterior Pelvic Tilt  - 1 x daily - 7 x weekly - 1 sets - 20 reps - 5 hold  - Sidelying Thoracolumbar Mobilisation with Towel Roll  - 2 x daily - 7 x weekly - 1 sets - 1 reps - 5 minutes hold  - Clamshell with Resistance (Mirrored)  - 1 x daily - 7 x weekly - 2 sets - 10 reps - 3 hold  - Sidelying Hip Abduction (Mirrored)  - 1 x daily - 7 x weekly - 2 sets - 10 reps  - Hooklying Transversus Abdominis Palpation  - 1 x daily - 7 x weekly - 1 sets - 15 reps - 5 hold  - Supine March  - 1 x daily - 7 x weekly - 2 sets - 10 reps  - Supine 90/90 Alternating Toe Touch  - 1 x daily - 7 x weekly - 2 sets - 10 reps  - Full Plank  - 1 x daily - 7 x weekly - 1 sets - 3 reps - 30-45 hold  - Supine Bridge with Leg Extension  - 1 x daily - 7 x weekly - 2 sets - 10 reps - 5 hold  - Bird Dog  - 1 x daily - 7 x weekly - 2 sets - 10 reps  - Supine Dead Bug with Leg Extension  - 1 x daily - 7 x weekly - 2 sets - 10 reps  - Quadruped Hip Abduction with Resistance Loop  - 1 x daily - 7 x weekly - 2 sets - 10 reps  - Sternocleidomastoid Stretch  - 1 x daily - 7 x weekly - 1 sets - 3 reps - 30 hold  - Supine Piriformis Stretch with Foot on Ground  - 1 x daily - 7 x weekly - 3 sets - 10 reps  - Supine Bilateral Isometric ER With Mobilization Belt  - 1 x daily - 7 x weekly - 15 sets - 10 reps - 5 hold  - Supine Bilateral Isometric Hip Abduction With Mobilization Belt  - 1 x daily - 7 x weekly - 3 sets - 10 reps  - Supine Figure 4 Piriformis Stretch  - 1 x daily - 7 x weekly - 1 sets - 10 reps - 10 hold  - Supine LE Neural Mobilization  - 1 x daily - 7 x weekly - 2 sets - 10 reps  - Supine Gluteal Sets  - 1 x daily - 7 x weekly - 1 sets - 20 reps - 5 hold  - Unilateral Isometric Glute Activation Bent Knee - Left  - 1 x daily - 7 x weekly - 3 sets - 10 reps  - Curl Up with Arms Crossed  - 1 x daily - 7 x weekly - 2 sets - 10 reps - 1 hold  - Side Plank on Elbow  - 1 x daily - 7 x weekly - 1 sets - 3 reps - 10 hold      Subjective:     History of Present Condition     Date of onset:  08/23/2016    Date is approximation?: yes    Explanation:  Patient reports onset of symptoms in 2018.    History of Present Condition/Chief Complaint:  Vincent Waters is a 43 y.o. right hand dominant adult (male to male transition in process) who  presents to outpatient physical therapy with chronic low back pressure, tightness and pain that began in 2018.  Patient cannot remember any specific traumatic mechanism of injury that may have been the result of his symptoms, but he does report that he broke his right leg 2x when he was younger.  The first one was when he was a baby.  The second time he broke it falling off the couch when he was 8.  He can't remember if it was his femur or tibia and he hasn't had a recent x-ray.  Subjective:  Vincent Waters reports that he's really been working on his core strength as much as possible.  He has been getting into the gym consistently 3x/week and he thinks that's been helping quite a bit.  He recorded his planks and his front plank is now up to 76 seconds and his side planks is up to 40 seconds each side.  Overall, he's feeling better, but he continues to have periods of severe pain in his left posterior hip that radiates down the back side of his left leg when it happens.  He has noticed that sitting is what causes the pain to sky rocket and his hip muscles to lock up.  He starts to feel the onset of symptoms after sitting for 30 minutes and in 2-3 hours it becomes unbearable to sit any more. At that point, he has to stand and walk to help it to calm down.  He has been regularly walking and avoiding driving as much and that seems to help avoid severe flare-ups.  Gabapentin  dulls the pain, but it starts to wear off after 2-3 hours.  He gets about 1 week of relief from manual therapy.  He is dong his exercises religiously, working on improving his strength.    Pain:     Current pain rating:  6    At best pain rating:  0    At worst pain rating:  9  Location:  Left posterior hip radiating down the back of his leg    Quality:  Aching    Progression:  Improved      Objective:     Static Posture Assessment   Lumbar Spine-Increased lordosis.     Palpation   Left Hip   Hypertonic in the piriformis.  Tenderness of the piriformis.    Strength  Left Hip   Planes of Motion   Flexion: 5  Extension: 5  Abduction: 5  External rotation: 4+  Internal rotation: 5    Tests     Functional Assessment   Functional Assessment Comments-Front Plank: 76 seconds  Side Plank:  Right: 40 seconds  Left: 40 seconds                                  I attest that I have reviewed the above information.  Signed: Karena JINNY [facility], PT  03/06/2024 10:49 AM

## 2024-03-12 DIAGNOSIS — G894 Chronic pain syndrome: Principal | ICD-10-CM

## 2024-03-12 DIAGNOSIS — G540 Brachial plexus disorders: Principal | ICD-10-CM

## 2024-03-12 DIAGNOSIS — M7918 Myalgia, other site: Principal | ICD-10-CM

## 2024-03-12 MED ORDER — GABAPENTIN 250 MG/5 ML ORAL SOLUTION
ORAL | 1 refills | 36.00000 days
Start: 2024-03-12 — End: ?

## 2024-03-13 ENCOUNTER — Encounter: Admit: 2024-03-13 | Discharge: 2024-03-14 | Payer: BLUE CROSS/BLUE SHIELD

## 2024-03-13 DIAGNOSIS — M545 Low back pain of over 3 months duration: Principal | ICD-10-CM

## 2024-03-13 DIAGNOSIS — M25552 Pain in left hip: Principal | ICD-10-CM

## 2024-03-13 DIAGNOSIS — G894 Chronic pain syndrome: Principal | ICD-10-CM

## 2024-03-13 DIAGNOSIS — G540 Brachial plexus disorders: Principal | ICD-10-CM

## 2024-03-13 DIAGNOSIS — M7918 Myalgia, other site: Principal | ICD-10-CM

## 2024-03-13 MED ORDER — METHOCARBAMOL 500 MG TABLET
ORAL_TABLET | Freq: Four times a day (QID) | ORAL | 2 refills | 30.00000 days | Status: CP
Start: 2024-03-13 — End: ?
  Filled 2024-03-14: qty 120, 30d supply, fill #0

## 2024-03-13 MED ORDER — GABAPENTIN 250 MG/5 ML ORAL SOLUTION
Freq: Three times a day (TID) | ORAL | 2 refills | 30.00000 days | Status: CP
Start: 2024-03-13 — End: ?
  Filled 2024-03-14: 26d supply, fill #0

## 2024-03-13 NOTE — Unmapped (Signed)
 [facility] THERAPY SERVICES AT [facility] [place]  OUTPATIENT PHYSICAL THERAPY  03/13/2024  Note Type: Treatment Note       Patient Name: Vincent Waters  Date of Birth:1980-10-15  Diagnosis:   Encounter Diagnoses   Name Primary?    Chronic right-sided low back pain without sciatica Yes    Low back pain radiating to right lower extremity          Referring MD:  Debrah Morene Cadet,*     Date of Onset of Impairment-08/23/2016  Date PT Care Plan Established or Reviewed-03/06/2024  Date PT Treatment Started-08/24/2023   Visit Count: 24  Plan of Care Effective Date:          Assessment/Plan:    Assessment  Assessment details:    Extensive discussion with Vincent Waters about possible autoimmune issues that are effecting his gut and potentially increasing the amount of system inflammation that is making his hip and shoulder pain worse.  We agreed that he will continue to work on strength work independently and we will keep his pain low with manual therapy.  We agreed that we may take a break from therapy for a period of time for him to pursue these other issues determine his Plan of Care after that point.  All questions answered.  This patient requires skilled physical therapy services to address the outlined impairments in order to return to his desired level of function.      Impairments: pain, muscular restrictions, hypertonicity, core weakness, impaired ADLs, joint restriction, decreased strength and gait deviation                      Therapy Goals      Goals:      Goals: Updated 03/06/2024.   In 12 weeks:  Goal #1: Patient will be independent with progressively monitored home exercise program.   Baseline/Current: COMPLETE.   Patient is familiar with home exercise program and performs it consistently.     Goal #2: Patient will demonstrate lumbar ROM within normal limits without pain in order to perform daily functional activities such as bending to pick an object off the ground, twisting to reach into the patient's backseat or bend backwards in order to lift an object overhead.   Baseline/Current: COMPLETE.  Patient now has normal lumbar ROM with reports of tightness (see ROM testing).     Goal #3: Patient will demonstrate left hip (on previous notes listed as incorrect side, now corrected 03/06/2024) External Rotation strength 5/5 during manual muscle testing for improved pelvic stability for better walking, squatting and negotiating stairs, reporting pain </= 2/10 with those activities.   Baseline/Current: NEARLY COMPLETE (noted 03/06/2024).  Patient now has 4+/5 left hip ER strength noted on manual muscle strength with all other hip strength testing normal (see manual muscle testing).      Goal #4: Patient will be able to lift at least 75 lb from floor to waist with good lifting mechanics in order to demonstrate good core and trunk strength so he can lift grocery bags and items around the house with pain </= 2/10.   Baseline/Current: PARTIALLY COMPLETE.  Patient reports that he can lift a case of water (up to 40-50 lb) without a problem.    Goal #5: Patient will report improvement in symptoms while sitting and ability to sit up to 1 hour with minimal symptoms.  Baseline/Current: PARTIALLY COMPLETE (noted 03/06/2024).  Patient reports that he now sit for up to 30 minutes before onset  of symptoms.    Goal #6: Patient will demonstrate improved posture without cuing for pain </= 2/10.  Baseline/Current: NEARLY COMPLETE (noted 03/06/2024)  Patient is now much more aware of his body position and is doing a much better job controlling his increased lordosis in standing and sitting positions.    Goal #7: Patient will be able to hold a front plank for >/= 2 minutes to demonstrate improved core strength for improved tolerance to sitting and increased stability while walking.  Baseline/Current: NEARLY COMPLETE (noted 03/06/2024).  Patient can now hold a front plank for ~ 76 seconds, reporting that he's feeling much stronger.    Goal #8: Patient will be able to hold a side plank >/= 45 seconds to demonstrate improved core/trunk strength for improved sitting tolerance and increased stability while walking.  Baseline/Current: NEARLY COMPLETE (noted 03/06/2024).  Patient can hold a side plank bilaterally for ~ 40 seconds.    Plan    Therapy options: will be seen for skilled physical therapy services    Planned therapy interventions: 20560, 20561-Dry Needling 1-2, 3+ areas, 97010-Cold Packs/Hot Packs, 97012-Mechanical Traction, 97032, G0283-Electrical Stimulation (unattended, attended), 97110-Therapeutic Exercises, 97112-Neuromuscular Re-education, 97116-Gait Training, 97140-Manual Therapy, 97164-Re-evaluation (PT) and 97530-Therapeutic Activities      Frequency: 1x week    Duration in weeks: 8    Education provided to: patient.    Education provided: Surveyor, minerals, HEP and Treatment options and plan    Education results: verbalized good understanding, needs reinforcement, demonstrates understanding and needs further instruction.    Communication/Consultation: N/A.    Next visit plan:        Progress core strengthening, especially plank. Manual therapy/TDN as needed to calm left hip external rotators.        Total Timed Code Minutes: 35    Treatment rendered today:      Therapeutic Exercise: 30 minutes  Patient Education: Plan of Care and Home Exercise Program.    Manual Therapy: 5 minutes  Iain Uh [facility]  was informed of the risks associated with dry needling and he signed consent form.  Patient screened for any contraindications to needling including pregnancy, infection, antibiotic use, anticoagulants use, autoimmune issues, uncontrolled diabetes, and active cancer/treatment and does not have any of these conditions.  DRY NEEDLING FOR RELEASING IDENTIFIED TRIGGER POINTS TO FACILITATE MANUAL THERAPY TECHNIQUES AND ALLOW MUSCLE TO RETURN TO OPTIMAL LENGTH (NO CHARGE).  Right upper trapezius trigger point with 50mm x .30mm with Pinch/Pull Technique established for safety - Local Twitch Response produced    Plan details: Access Code: [email or web address]  URL: [email or web address]  Date: 01/17/2024  Prepared by: Karena Harbour    Exercises  - Supine Posterior Pelvic Tilt  - 1 x daily - 7 x weekly - 1 sets - 20 reps - 5 hold  - Sidelying Thoracolumbar Mobilisation with Towel Roll  - 2 x daily - 7 x weekly - 1 sets - 1 reps - 5 minutes hold  - Clamshell with Resistance (Mirrored)  - 1 x daily - 7 x weekly - 2 sets - 10 reps - 3 hold  - Sidelying Hip Abduction (Mirrored)  - 1 x daily - 7 x weekly - 2 sets - 10 reps  - Hooklying Transversus Abdominis Palpation  - 1 x daily - 7 x weekly - 1 sets - 15 reps - 5 hold  - Supine March  - 1 x daily - 7 x weekly - 2 sets - 10 reps  - Supine 90/90 Alternating Toe Touch  -  1 x daily - 7 x weekly - 2 sets - 10 reps  - Full Plank  - 1 x daily - 7 x weekly - 1 sets - 3 reps - 30-45 hold  - Supine Bridge with Leg Extension  - 1 x daily - 7 x weekly - 2 sets - 10 reps - 5 hold  - Bird Dog  - 1 x daily - 7 x weekly - 2 sets - 10 reps  - Supine Dead Bug with Leg Extension  - 1 x daily - 7 x weekly - 2 sets - 10 reps  - Quadruped Hip Abduction with Resistance Loop  - 1 x daily - 7 x weekly - 2 sets - 10 reps  - Sternocleidomastoid Stretch  - 1 x daily - 7 x weekly - 1 sets - 3 reps - 30 hold  - Supine Piriformis Stretch with Foot on Ground  - 1 x daily - 7 x weekly - 3 sets - 10 reps  - Supine Bilateral Isometric ER With Mobilization Belt  - 1 x daily - 7 x weekly - 15 sets - 10 reps - 5 hold  - Supine Bilateral Isometric Hip Abduction With Mobilization Belt  - 1 x daily - 7 x weekly - 3 sets - 10 reps  - Supine Figure 4 Piriformis Stretch  - 1 x daily - 7 x weekly - 1 sets - 10 reps - 10 hold  - Supine LE Neural Mobilization  - 1 x daily - 7 x weekly - 2 sets - 10 reps  - Supine Gluteal Sets  - 1 x daily - 7 x weekly - 1 sets - 20 reps - 5 hold  - Unilateral Isometric Glute Activation Bent Knee - Left  - 1 x daily - 7 x weekly - 3 sets - 10 reps  - Curl Up with Arms Crossed  - 1 x daily - 7 x weekly - 2 sets - 10 reps - 1 hold  - Side Plank on Elbow  - 1 x daily - 7 x weekly - 1 sets - 3 reps - 10 hold      Subjective:     History of Present Condition     Date of onset:  08/23/2016    Date is approximation?: yes    Explanation:  Patient reports onset of symptoms in 2018.    History of Present Condition/Chief Complaint:  Vincent Waters is a 43 y.o. right hand dominant adult (male to male transition in process) who presents to outpatient physical therapy with chronic low back pressure, tightness and pain that began in 2018.  Patient cannot remember any specific traumatic mechanism of injury that may have been the result of his symptoms, but he does report that he broke his right leg 2x when he was younger.  The first one was when he was a baby.  The second time he broke it falling off the couch when he was 8.  He can't remember if it was his femur or tibia and he hasn't had a recent x-ray.  Subjective:  Vincent Waters reports that he was talking to another Rheumatologist who thinks that he might have scleroderma, which might be contributing to what's going on. He has noticed that he has flare-ups intermittently which increases his pain.  He recently had some blood work during this active flare to see if this is contributing to what's going on.  Pain:     Current pain rating:  7  Location:  Left posterior hip radiating down the back of his leg    Quality:  Aching    Progression:  Improved      Objective:     Static Posture Assessment   Lumbar Spine-Increased lordosis.     Palpation   Left Hip   Hypertonic in the piriformis.  Tenderness of the piriformis.    Strength  Left Hip   Planes of Motion   Flexion: 5  Extension: 5  Abduction: 5  External rotation: 4+  Internal rotation: 5    Tests     Functional Assessment   Functional Assessment Comments-Front Plank: 76 seconds  Side Plank:  Right: 40 seconds  Left: 40 seconds I attest that I have reviewed the above information.  Signed: Karena JINNY Harbour, PT  03/13/2024 12:21 PM

## 2024-03-13 NOTE — Unmapped (Signed)
 Department of Anesthesiology  [facility]  [address], [address]  [place], [place] [address]  [phone]    I spent 28 minutes on the real-time audio and video with the patient. I spent an additional 25 minutes on pre- and post-visit activities. The patient consented to this consult.    The patient was physically located in [place]  or a state in which I am permitted to provide care. The patient understood that s/he may incur co-pays and cost sharing, and agreed to the telemedicine visit. The visit was completed via phone and/or video, which was appropriate and reasonable under the circumstances given the patient's presentation at the time.    The patient has been advised of the potential risks and limitations of this mode of treatment (including, but not limited to, the absence of in-person examination) and has agreed to be treated using telemedicine. The patient's/patient's family's questions regarding telemedicine have been answered. No vitals or physical exam was performed but the previous exam was copied forward in this note for continuity.     If the phone/video visit was completed in an ambulatory setting, the patient has also been advised to contact their provider???s office for worsening conditions, and seek emergency medical treatment and/or call 911 if the patient deems either necessary.    Visit modifiers:   POS 02 and 95 (virtual visit with video)    -Location of patient during visit: [place]  -Provider location: [place]  -Names of all people present during visit: [facility]; Warren Hummer, FNP      Chronic Pain Follow-Up Note  1. Myofascial pain    2. TOS (thoracic outlet syndrome)    3. Chronic pain syndrome    4. Left hip pain    5. Low back pain of over 3 months duration      Assessment and Plan  Vincent Waters is a 43 y.o. being [facility] of ongoing pain after TOS decompression/pectoralis minor release on the right for thoracic outlet syndrome by Dr. Ave in May 2023. He was first seen by our clinic in March 2023 for diagnostic injections. His range of motion has an increased postoperatively, but still has tight, spasming pain in the right neck and shoulder. He is currently weaning off benzodiazepines, discontinued opioid medications in late 2023 which he was chronically on for 3 years, and inquires about optimizing medications and procedures to improve his baseline pain at this time. His primary pain generator includes myofascial pain post-TOS decompression. He has other medical history that includes anxiety, scleroderma, migraines, autoimmune autonomic neuropathy, asthma. Also follows with sports medicine and has undergone injections there as well. We have performed TPI and spinal accessory nerve block/pulsed RFA with benefit.     September 2025  Thoracic Outlet Syndrome - Myofascial pain - Chronic Pain Syndrome   Persistent myofascial pain in the right shoulder with muscle locking despite physical therapy. Gabapentin  being adjusted for better pain control. Considering disability due to impact on daily activities.  - Increase gabapentin  to 900 mg three times daily.  - Start Robaxin  500 mg four times daily as needed or 1000 mg twice daily as needed.  - Ordered trigger point injections for the right trapezius and cervical paraspinal muscles.  - Continue following with PT  - Continue clonazepam  taper with PCP, seems like wean has paused.  - Consider Botox injection into trap muscles with another provider (due to cost concerns)  Repeat TPI must document:     - Pt with focal area of pain in skeletal muscle: Right trap, cervical paraspinal, levator, rhomboids  - Exam reveals hyperirritable spot and taut band identified by palpation   -Area of pain has restricted range of motion: Yes.  - Pt has failed non-invasive conservative therapy and has limited movement of affected area  - Pt involved in ongoing conservative treatment program including HEP  -Possible surgical treatment? 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 Immediate temporary improvement with piriformis CSI (12/27/23). 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 Has follow up with PM&R next month, they will try injection again and order EMG if no relief (2022 EMG with chronic L2-3 radiculopathy).  - Continue PT and HEP  - Continue daily static stretching exercises  - Discussed epidural injection if pain persists and indicated on EMG    Considerations for the future:  - Repeat TPIs PRN  - Continue follow up with Oromaxillofacial   - Consider initiation of Butrans/buprenorphine product after weaning off of benzodiazepines and pain psych evaluation for COM should conservative measures fail, but hoping to avoid if possible  - Consider up titration of gabapentin  oral solution or rotation to Lyrica   - Consider SI joint CSI or GT CSI after 2 months if no improvement in hip pain (piriformis CSI 12/27/2023)  - Botox to trap and/or piriformis-would likely need to be another clinic due to cost    Return in about 3 months (around 06/12/2024).    Requested Prescriptions     Signed Prescriptions Disp Refills    gabapentin  (NEURONTIN ) 250 mg/5 mL oral solution 1620 mL 2     Sig: Take 18 mL (900 mg total) by mouth Three (3) times a day.    methocarbamol  (ROBAXIN ) 500 MG tablet 120 tablet 2     Sig: Take 1 tablet (500 mg total) by mouth four (4) times a day. Alternatively, take 2 tablets twice daily.     Orders Placed This Encounter Procedures    Trigger Point Injs 3+ Muscles [phone])     Right trapezius, cervical paraspinal musculature with Lobonc     Standing Status:   Future     Expected Date:   03/13/2024     Expiration Date:   03/13/2025     HPI:  Vincent Waters is seen in consultation at the request of Dr. Ave for evaluation and recommendations regarding His pain.     History of Present Illness  Vincent Waters is a 43 year old male who presents with persistent shoulder and hip pain.    He experiences ongoing shoulder pain characterized by persistent muscle locking despite regular physical therapy. While there has been improvement in strength, the muscle locking remains a weekly issue. Financial constraints prevent him from pursuing Botox injections, and insurance issues have limited further physical therapy sessions.    He suffers from significant hip pain, which has been treated with injections into the piriformis muscle. Despite these treatments, the pain persists and is accompanied by swelling in  the ankle. 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    He is currently taking gabapentin , 600 mg in the morning and 900 mg at night, and is considering increasing the dose to 900 mg three times daily. He also uses Klonopin  as needed for sleep issues, which helps ease his anxiety and allows him to sleep. He has tried Flexeril  in the past but found it too sedating and not helpful for driving. He is open to trying Robaxin  for muscle relaxation.    He has a history of pinched nerves and has undergone nerve studies, which revealed radiculopathy. He experiences pain shooting down the front of his leg when his physical therapist massages the affected area. Further nerve studies have been recommended to assess any changes.    He has been dealing with multiple musculoskeletal issues, including a non-releasing trapezius muscle on the right side, which has been dry needled by his physical therapist. The focus on his hip issues has led to regression in his shoulder condition.    He is unable to maintain a regular job due to his medical conditions. He is actively trying to stay physically active by going to the gym and walking.    Current Medications:  Gabapentin  600 mg AM, 900 mg PM  Clonazepam  0.5 mg every day PRN  Voltaren  gel    Review Of Systems  Negative except for HPI    Previous Medication Trials:  NSAIDS- ibuprofen   Antidepressants-   Anticonvulsant- gabapentin   Muscle relaxants- flexeril , baclofen , tizanidine  - side effects  Topicals-voltaren  gel  Short-acting opiates-percocet   Long-acting opiates-   Anxiolytics-   Other-     Previous Interventions  Medications, PT, surgery, injections with sports med  TOS decompression/PMR 11/16/21  TPI-05/10/22-very helpful.  Have repeated several times since  PT-2023  Left GTB CSI-sports med 05/27/22  Spinal accessory nerve block-11/04/22  Spinal accessory nerve block and pulsed RFA-12/29/22- had significant relief, 07/27/23- no relief  Left piriformis CSI 12/27/23-sports med    Allergies as of 03/13/2024 - Reviewed 03/13/2024   Allergen Reaction Noted    Latex Rash and Hives 09/16/2016    Penicillins Anaphylaxis 09/16/2016    Shellfish containing products Anaphylaxis 01/23/2017    Trileptal [oxcarbazepine] Rash 07/19/2023      Current Outpatient Medications   Medication Sig Dispense Refill    albuterol  2.5 mg /3 mL (0.083 %) nebulizer solution Inhale 3 mL (2.5 mg total) by nebulization every six (6) hours as needed for wheezing or shortness of breath. 90 mL 1    azelastine  (ASTELIN ) 137 mcg (0.1 %) nasal spray Instill 2 sprays into each nostril two (2) times a day as needed for rhinitis. 30 mL 1    bisacodyl  (DULCOLAX) 5 mg EC tablet Take 2 tablets (10 mg total) by mouth Take as directed. Take 2 tablets (10 mg total) as directed for bowel prep. 2 tablet 0    budesonide -formoterol  (SYMBICORT ) 160-4.5 mcg/actuation inhaler Inhale 1 puff by mouth Two (2) times a day. 10.2 g 5    cabotegravir  (APRETUDE ) 600 mg/3 mL extended-release injection Inject 3 mL (600 mg total) into the muscle every thirty (30) days for 60 days, THEN 3 mL (600 mg total) every 8 weeks. 3 mL 11    cetirizine  (ZYRTEC ) 10 MG tablet Take 1 tablet (10 mg total) by mouth daily. (Patient not taking: Reported on 01/09/2024) 30 tablet 12    clonazePAM  (KLONOPIN )  0.5 MG tablet Take 0.5 tablets (0.25 mg total) by mouth two (2) times a day as needed for anxiety. 30 tablet 5    dextroamphetamine -amphetamine  (ADDERALL) 20 mg tablet Take 1 tablet (20 mg total) by mouth two (2) times a day. 60 tablet 0    [START ON 04/01/2024] dextroamphetamine -amphetamine  (ADDERALL) 20 mg tablet Take 1 tablet (20 mg total) by mouth two (2) times a day. 60 tablet 0    doxycycline  (VIBRA -TABS) 100 MG tablet Take 2 tablets (200 mg total) by mouth once as needed (ideally within 24 hours, but no later than 72 hours after condomless sex) for up to 10 doses. Take with full glass of water, and remain upright for 30 minutes. 20 tablet 11    EPINEPHrine  (EPIPEN ) 0.3 mg/0.3 mL injection Inject 0.3 mL (0.3 mg total) under the skin once for 1 dose. 2 each 0    gabapentin  (NEURONTIN ) 250 mg/5 mL oral solution Take 18 mL (900 mg total) by mouth Three (3) times a day. 1620 mL 2    inhalational spacing device Spcr Use as directed with albuterol  and symbicort  1 each 1    ipratropium (ATROVENT ) 42 mcg (0.06 %) nasal spray 1-2 sprays in each nostril as needed for drainage, up to 4 time daily. 15 mL 2    methocarbamol  (ROBAXIN ) 500 MG tablet Take 1 tablet (500 mg total) by mouth four (4) times a day. Alternatively, take 2 tablets twice daily. 120 tablet 2    metoclopramide  (REGLAN ) 10 MG tablet Take 1/2 tablet (5 mg total) by mouth Three (3) times a day with a meal. 135 tablet 3    nebulizer and compressor (COMP-AIR NEBULIZER COMPRESSOR) Devi 1 each by Miscellaneous route nightly as needed. 1 each 0    needle, disp, 25 Calistro (BD REGULAR BEVEL NEEDLES) 25 Mace x 5/8 Ndle For subcutaneous hormone injection. 25 each 0    omalizumab  (XOLAIR ) 300 mg/2 mL auto-injector Inject the contents of 1 pen (300 mg) under the skin every fourteen (14) days. 4 mL 11    omalizumab  75 mg/0.5 mL AtIn Inject the contents of 1 pen (75 mg) under the skin every fourteen (14) days. 1 mL 11    polyethylene glycol (CLEARLAX) 17 gram/dose powder Take as directed for extended bowel prep. 238 g 0    prucalopride (MOTEGRITY ) 2 mg Tab Take 1 tablet (2 mg total) by mouth daily. 90 tablet 3    safety needles (BD ECLIPSE) 25 Niall x 1 Ndle use for testosterone  12 each 0    safety needles (BD SAFETYGLIDE NEEDLE) 18 Melvin x 1 1/2 Ndle For drawing hormone injection 25 each 0    syringe, disposable, (EASY TOUCH LUER LOCK SYRINGE) 1 mL Syrg Use for weekly hormone injection. 4 each 0    syringe, disposable, (EASY TOUCH LUER LOCK SYRINGE) 1 mL Syrg Use for testosterone  12 each 0    syringe, disposable, 2.5 mL Syrg Use weekly 30 Syringe 2    testosterone  cypionate (DEPOTESTOTERONE CYPIONATE) 200 mg/mL injection Inject 0.2 mL (40 mg total) into the muscle once a week. 12 mL 0    triamcinolone  (KENALOG ) 0.1 % cream Apply topically Two (2) times a day. 15 g 0    varicella-zoster gE-AS01B (PF) (SHINGRIX ) injection Inject 0.5 mL into the muscle. 0.5 mL 1    verapamil  (CALAN ) 40 MG tablet Take 1 tablet (40 mg total) by mouth nightly for 14 days, THEN 1 tablet (40 mg total) two (2) times a day for 28  days. 60 tablet 2     Current Facility-Administered Medications   Medication Dose Route Frequency Provider Last Rate Last Admin    omalizumab  (XOLAIR ) injection 300 mg  300 mg Subcutaneous Q28 Days [name], [name], [name]   300 mg at 05/24/22 1138     Imaging/Tests:     Lab Results   Component Value Date    PLT 304 02/07/2024     Lab Results   Component Value Date    CREATININE 0.86 02/07/2024     Lab Results   Component Value Date    A1C 5.4 11/28/2023     Lab Results Component Value Date    ALKPHOS 110 01/19/2023    BILITOT 0.5 01/19/2023    BILIDIR 0.20 01/19/2023    PROT 7.9 01/19/2023    ALBUMIN 4.3 01/19/2023    ALT 10 01/19/2023    AST 18 01/19/2023     PHYSICAL EXAM: Limited due to virtual visit    Wt Readings from Last 3 Encounters:   02/07/24 70.6 kg (155 lb 9.6 oz)   01/22/24 69 kg (152 lb 3.2 oz)   01/09/24 68 kg (150 lb)     GENERAL:  The patient is well developed, well-nourished, and appears to be in no apparent distress.   HEAD/NECK:    Normocephalic/atraumatic.   CV:  Deferred  LUNGS:   Normal work of breathing  EXTREMITIES:  Deferred  NEUROLOGIC:    The patient is alert and oriented, speech fluent, normal language.   MUSCULOSKELETAL:    Deferred  SKIN:   Deferred  PSY:   Appropriate affect. No overt pain behaviors. No evidence of psychomotor retardation or agitation, no signs of intoxication.     We are delivering comprehensive, continuous, longitudinal care for this patient with chronic pain.

## 2024-03-13 NOTE — Unmapped (Signed)
 [facility] Refill Coordination Note    Vincent Waters, [place]: 13-Jan-1981  Phone: [phone] (home) [phone] (work)      All above HIPAA information was verified with patient.         03/12/2024     4:30 PM   Specialty Rx Medication Refill Questionnaire   Which Medications would you like refilled and shipped? Xolair    Please list all current allergies: Latex pen   Have you missed any doses in the last 30 days? No   Have you had any changes to your medication(s) since your last refill? No   How much of each medication do you have remaining at home? (eg. number of tablets, injections, etc.) 0   If receiving an injectable medication, next injection date is 03/17/2024   Have you experienced any side effects in the last 30 days? No   Please enter the full address (street address, city, state, zip code) where you would like your medication(s) to be delivered to. [address], [place] [address]   Please specify on which day you would like your medication(s) to arrive. Note: if you need your medication(s) within 3 days, please call the pharmacy to schedule your order at [phone]  03/14/2024   Has your insurance changed since your last refill? No   Would you like a pharmacist to call you to discuss your medication(s)? No   Do you require a signature for your package? (Note: if we are billing Medicare Part B or your order contains a controlled substance, we will require a signature) No   I have been provided my out of pocket cost for my medication and approve the pharmacy to charge the amount to my credit card on file. Yes         Completed refill call assessment today to schedule patient's medication shipment from the [facility] [phone]).  All relevant notes have been reviewed.       Confirmed patient received a Conservation officer, historic buildings and a Surveyor, mining with first shipment. The patient will receive a drug information handout for each medication shipped and additional FDA Medication Guides as required.         REFERRAL TO PHARMACIST     Referral to the pharmacist: Not needed      [facility]     Shipping address confirmed in Epic.     Delivery Scheduled: Yes, Expected medication delivery date: 03/14/2024.     Medication will be delivered via Same Day Courier to the prescription address in Epic WAM.    Lucie CHRISTELLA Forts   [facility] Specialty Technician

## 2024-03-13 NOTE — Unmapped (Signed)
 Called patient and lvm asking them to either call back or send my chart message with any concerns or questions

## 2024-03-13 NOTE — Unmapped (Signed)
 Increase your gabapentin  to 900 mg three times daily. If you have side effects at this dose, decrease back to 600 mg in the morning and at night.    Start methocarbamol  (Robaxin ) for muscle pain. Take 1 tablet up to four times daily as needed; alternatively you can take 1000 mg twice daily.

## 2024-03-14 DIAGNOSIS — G894 Chronic pain syndrome: Principal | ICD-10-CM

## 2024-03-14 DIAGNOSIS — Z2981 Encounter for pre-exposure prophylaxis for HIV: Principal | ICD-10-CM

## 2024-03-14 DIAGNOSIS — G5702 Lesion of sciatic nerve, left lower limb: Principal | ICD-10-CM

## 2024-03-14 DIAGNOSIS — L270 Generalized skin eruption due to drugs and medicaments taken internally: Principal | ICD-10-CM

## 2024-03-14 MED ORDER — IBUPROFEN 800 MG TABLET
ORAL_TABLET | Freq: Three times a day (TID) | ORAL | 2 refills | 20.00000 days | Status: CP | PRN
Start: 2024-03-14 — End: 2025-03-14
  Filled 2024-03-14: qty 60, 20d supply, fill #0

## 2024-03-14 MED ORDER — EMTRICITABINE 200 MG-TENOFOVIR ALAFENAMIDE FUMARATE 25 MG TABLET
ORAL_TABLET | Freq: Every day | ORAL | 3 refills | 90.00000 days | Status: CP
Start: 2024-03-14 — End: ?

## 2024-03-14 MED FILL — XOLAIR 300 MG/2 ML SUBCUTANEOUS AUTO-INJECTOR: SUBCUTANEOUS | 28 days supply | Qty: 4 | Fill #2

## 2024-03-14 MED FILL — XOLAIR 75 MG/0.5 ML SUBCUTANEOUS AUTO-INJECTOR: SUBCUTANEOUS | 28 days supply | Qty: 1 | Fill #2

## 2024-03-14 NOTE — Unmapped (Signed)
 Persistent pain of the piriformis with sciatica. Has been seeing PT, sports medicine, and pain clinic. Pain increased gabapentin , PT has not been as effective lately. He feels that things are very inflamed today, would like to try 800 mg ibuprofen  to help with inflammation. Has not heard of low-dose naltrexone for chronic pain. Will rx 800mg  ibuprofen  today and touch base with his specialists David Santo, Warren Hummer, Dr. Irvin) to optimize pain regimen.   Orders:    ibuprofen  (MOTRIN ) 800 MG tablet; Take 1 tablet (800 mg total) by mouth every eight (8) hours as needed for pain.

## 2024-03-14 NOTE — Unmapped (Deleted)
 Persistent pain of the piriformis with sciatica. Has been seeing PT, sports medicine, and pain clinic. Pain increased gabapentin , PT has not been as effective lately. He feels that things are very inflamed today, would like to try 800 mg ibuprofen  to help with inflammation. Has not heard of low-dose naltrexone for chronic pain. Will rx 800mg  ibuprofen  today and touch base with his specialists David Santo, Warren Hummer, Dr. Irvin) to optimize pain regimen.   Orders:    ibuprofen  (MOTRIN ) 800 MG tablet; Take 1 tablet (800 mg total) by mouth every eight (8) hours as needed for pain.

## 2024-03-14 NOTE — Unmapped (Addendum)
 Patient is not currently sexually active. Estimated number of partners in past 3 months: none. Condom-less sexual encounters since last visit: 0. Known exposure to HIV: no. Known exposure to other STIs: no. IV drug use: no. Condom use: no. Denies fever, fatigue, pharyngitis, cervical lymphadenopathy, or any other symptoms concerning for acute HIV. 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 Denies throat pain, urethral discharge, or any other symptoms concerning for acute GC/CT infection. HEENT exam without pharyngeal erythema, tonsillar edema or exudate, palpably enlarged LN, or rash on exposed skin. Based on this information, they are at high risk for contracting HIV and are a good candidate for pre-exposure prophylaxis against HIV, but will switch therapies from Apretude  to Descovy . He has already been taking Descovy  again for 1.5-2 weeks. This is a preventive medicine service and E/M modifier 33 should apply to all testing and prescriptions. The lab tests listed below are required to ensure the safety of continuing pre-exposure prophylaxis (PrEP) against HIV.  - Labs to be drawn within one week: HIV, Cr, RPR,   - Repeat monitoring labs in 3 months  - Provided adherence and risk reduction / safer sex counseling  - PrEP refill provided today   - Advised use of additional STI prevention method (abstinence, barrier) until on Descovy  again for two full weeks  - Discussed return precautions for rash recurrence, as well as warning signs for SJS  Orders:    emtricitabine -tenofovir  alafen (DESCOVY ) 200-25 mg tablet; Take 1 tablet by mouth daily.    HIV Antigen/Antibody Combo; Future    Syphilis Screen; Future

## 2024-03-14 NOTE — Unmapped (Signed)
 [facility] at [facility]  Established Patient Clinic Note    Assessment/Plan:   [name] is a 43 y.o.adult who presents for follow-up.    - Current meds: Current Medications[1]  Assessment & Plan  Encounter for pre-exposure prophylaxis for HIV  Drug rash  Patient is not currently sexually active. Estimated number of partners in past 3 months: none. Condom-less sexual encounters since last visit: 0. Known exposure to HIV: no. Known exposure to other STIs: no. IV drug use: no. Condom use: no. Denies fever, fatigue, pharyngitis, cervical lymphadenopathy, or any other symptoms concerning for acute HIV. 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 Denies throat pain, urethral discharge, or any other symptoms concerning for acute GC/CT infection. HEENT exam without pharyngeal erythema, tonsillar edema or exudate, palpably enlarged LN, or rash on exposed skin. Based on this information, they are at high risk for contracting HIV and are a good candidate for pre-exposure prophylaxis against HIV, but will switch therapies from Apretude  to Descovy . He has already been taking Descovy  again for 1.5-2 weeks. This is a preventive medicine service and E/M modifier 33 should apply to all testing and prescriptions. The lab tests listed below are required to ensure the safety of continuing pre-exposure prophylaxis (PrEP) against HIV.  - Labs to be drawn within one week: HIV, Cr, RPR,   - Repeat monitoring labs in 3 months  - Provided adherence and risk reduction / safer sex counseling  - PrEP refill provided today   - Advised use of additional STI prevention method (abstinence, barrier) until on Descovy  again for two full weeks  - Discussed return precautions for rash recurrence, as well as warning signs for SJS  Orders:    emtricitabine -tenofovir  alafen (DESCOVY ) 200-25 mg tablet; Take 1 tablet by mouth daily.    HIV Antigen/Antibody Combo; Future    Syphilis Screen; Future    Chronic pain syndrome  Piriformis syndrome, left  Persistent pain of the piriformis with sciatica. Has been seeing PT, sports medicine, and pain clinic. Pain increased gabapentin , PT has not been as effective lately. He feels that things are very inflamed today, would like to try 800 mg ibuprofen  to help with inflammation. Has not heard of low-dose naltrexone for chronic pain. Will rx 800mg  ibuprofen  today and touch base with his specialists Vincent Waters, Vincent Waters, Dr. Irvin) to optimize pain regimen.   Orders:    ibuprofen  (MOTRIN ) 800 MG tablet; Take 1 tablet (800 mg total) by mouth every eight (8) hours as needed for pain.    HEALTH MAINTENANCE ITEMS STILL DUE:  Health Maintenance Due   Topic Date Due    Influenza Vaccine (1) 02/19/2024     Follow-up: No follow-ups on file.    Future Appointments   Date Time Provider Department Center   03/18/2024  8:30 AM  [facility] [place] NURSE UNCFMFAYS [facility] [facility]   03/19/2024  1:00 PM Waters Karena Pac, PT PTOT [facility]   03/22/2024  8:15 AM Lia Ee, FNP [facility]   03/27/2024  1:00 PM Celena Rozelle BROCKS, MD [facility]   03/28/2024 11:15 AM Marcelina, Adam [place], MD [facility]   04/02/2024 12:15 PM Myer, Karena Pac, PT PTOT [facility]   04/09/2024  8:20 AM Caro Concha Bruckner, MD CARD[facility]  04/10/2024  1:30 PM Melvenia Child [id number] [facility]   04/19/2024  9:45 AM Reside, Marcey PARAS, DMD UNCDFPOMLSUG [facility]   04/25/2024  9:30 AM Arne Lavanda Bang, PMHNP [facility]ilcom [facility]   05/03/2024 12:30 PM Sonna Gee, MD UNCGIMEDET [facility]   06/03/2024  8:00 AM Caid, Comer PARAS, MD [facility]   06/06/2024  9:30 AM Sebastian Pierce, FNP ANESPAINMRKT [facility]   06/21/2024 12:35 PM [place], Geofm CROME, FNP UNCHNEUMM [facility]       Subjective   Vincent Waters is a 43 y.o. adult  coming to clinic today for follow-up.    Chief Complaint   Patient presents with    Follow-up     Apretude  injection gave them a lot of side affects and they can't take it     HPI: see above for more information.     Took Apretude  and got a rash (started on the back and radiated to the neck, mom noticed on the neck, was itchy). Was red appearing. Non-painful. Appeared after less than 2 days. 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 Would like to switch back to Discovy. Prefers Discovy over Bictarvy due to stomach upset. Prefers doxy Prep.     Piriformis muscle still inflamed x 1.5 month. PT at the point where they can't do anymore. Almost at max gabapentin , upped by pain clinic. Was on ibuprofen  before (800 mg) and would like to be on it again. Would also like to know if there is anything else he can take. Has tried exercises and stretches, but stretching the piriformis irritates the hamstring. Feels like the pain has been stuck. Got dry needle last week and it made the pain 10x worse. Does not want to be on opioids or steroids.     I have reviewed the problem list, medications, and allergies and have updated/reconciled them if needed.    Vincent Waters  reports that he has never smoked. He has never been exposed to tobacco smoke. He has never used smokeless tobacco.  Health Maintenance   Topic Date Due    Influenza Vaccine (1) 02/19/2024    DTaP/Tdap/Td Vaccines (2 - Td or Tdap) 08/19/2027    Lipid Screening  11/01/2028    Pneumococcal Vaccine 0-49  Completed    Hepatitis C Screen  Completed    COVID-19 Vaccine  Completed       Objective     VITALS: BP 131/77 (BP Site: L Arm, BP Position: Sitting, BP Cuff Size: Medium)  - Pulse 84  - Resp 18  - Ht 162.6 cm (5' 4)  - Wt 71.1 kg (156 lb 12.8 oz)  - LMP 07/20/2017 (Exact Date)  - BMI 26.91 kg/m??     Physical Exam  General: well-appearing, sitting upright tilted to L side, in no acute distress  Head: Atraumatic  ENT: No dental trauma noted. MMM  Eyes: conjunctiva normal, non-erythematous, non-icteric, no discharge.  Neck: no thyroid enlargement or masses, no rash appreciated   Lungs: Normal work of breathing on room air, lungs clear to auscultation bilaterally   CV: RRR, no murmurs, rubs, or gallops appreciated   Skin: Warm, dry, no erythema or rash on exposed skin  Musculoskeletal: No visible gait abnormalities  Neurologic: Alert & oriented x 3, no gross sensorimotor abnormalities  Psychiatric: Pleasant, cooperative, good eye contact, appropriate thought processes    Wt Readings from Last 3 Encounters:   03/14/24 71.1  kg (156 lb 12.8 oz)   02/07/24 70.6 kg (155 lb 9.6 oz)   01/22/24 69 kg (152 lb 3.2 oz)      PHQ-9 PHQ-9 Total Score   10/22/2020   9:00 AM 6    09/04/2020   9:00 AM 3    08/21/2020   9:00 AM 5    05/22/2020   3:00 PM 9        Data saved with a previous flowsheet row definition       LABS/IMAGING: I have reviewed pertinent recent labs and imaging in Epic.    Note originally written by Camie Cadet, MS4.     I attest that I have reviewed the student note, and that the components of the history of the present illness, the physical exam, and the assessment and plan documented were performed by me or were performed in my presence by the student where I verified the documentation and performed (or re-performed) the exam and medical decision making.     Odis Aho, MD, MPH (he/him)  [facility]  [address]   [place], [place] [address]  Phone: [phone]  Fax: [phone]         [1]   Current Outpatient Medications:     albuterol  2.5 mg /3 mL (0.083 %) nebulizer solution, Inhale 3 mL (2.5 mg total) by nebulization every six (6) hours as needed for wheezing or shortness of breath., Disp: 90 mL, Rfl: 1    azelastine  (ASTELIN ) 137 mcg (0.1 %) nasal spray, Instill 2 sprays into each nostril two (2) times a day as needed for rhinitis., Disp: 30 mL, Rfl: 1    bisacodyl  (DULCOLAX) 5 mg EC tablet, Take 2 tablets (10 mg total) by mouth Take as directed. Take 2 tablets (10 mg total) as directed for bowel prep., Disp: 2 tablet, Rfl: 0    budesonide -formoterol  (SYMBICORT ) 160-4.5 mcg/actuation inhaler, Inhale 1 puff by mouth Two (2) times a day., Disp: 10.2 g, Rfl: 5    cetirizine  (ZYRTEC ) 10 MG tablet, Take 1 tablet (10 mg total) by mouth daily., Disp: 30 tablet, Rfl: 12    clonazePAM  (KLONOPIN ) 0.5 MG tablet, Take 0.5 tablets (0.25 mg total) by mouth two (2) times a day as needed for anxiety., Disp: 30 tablet, Rfl: 5    dextroamphetamine -amphetamine  (ADDERALL) 20 mg tablet, Take 1 tablet (20 mg total) by mouth two (2) times a day., Disp: 60 tablet, Rfl: 0    dextroamphetamine -amphetamine  (ADDERALL) 20 mg tablet, Take 1 tablet (20 mg total) by mouth two (2) times a day., Disp: 60 tablet, Rfl: 0    [START ON 04/01/2024] dextroamphetamine -amphetamine  (ADDERALL) 20 mg tablet, Take 1 tablet (20 mg total) by mouth two (2) times a day., Disp: 60 tablet, Rfl: 0    doxycycline  (VIBRA -TABS) 100 MG tablet, Take 2 tablets (200 mg total) by mouth once as needed (ideally within 24 hours, but no later than 72 hours after condomless sex) for up to 10 doses. Take with full glass of water, and remain upright for 30 minutes., Disp: 20 tablet, Rfl: 11    EPINEPHrine  (EPIPEN ) 0.3 mg/0.3 mL injection, Inject 0.3 mL (0.3 mg total) under the skin once for 1 dose., Disp: 2 each, Rfl: 0    gabapentin  (NEURONTIN ) 250 mg/5 mL oral solution, Take 18 mL (900 mg total) by mouth Three (3) times a day., Disp: 1620 mL, Rfl: 2    inhalational spacing device Spcr, Use as directed with albuterol  and symbicort ,  Disp: 1 each, Rfl: 1    ipratropium (ATROVENT ) 42 mcg (0.06 %) nasal spray, 1-2 sprays in each nostril as needed for drainage, up to 4 time daily., Disp: 15 mL, Rfl: 2    methocarbamol  (ROBAXIN ) 500 MG tablet, Take 1 tablet (500 mg total) by mouth four (4) times a day. Alternatively, take 2 tablets twice daily., Disp: 120 tablet, Rfl: 2    metoclopramide  (REGLAN ) 10 MG tablet, Take 1/2 tablet (5 mg total) by mouth Three (3) times a day with a meal., Disp: 135 tablet, Rfl: 3    nebulizer and compressor (COMP-AIR NEBULIZER COMPRESSOR) Devi, 1 each by Miscellaneous route nightly as needed., Disp: 1 each, Rfl: 0    needle, disp, 25 Dex (BD REGULAR BEVEL NEEDLES) 25 Jordell x 5/8 Ndle, For subcutaneous hormone injection., Disp: 25 each, Rfl: 0    omalizumab  (XOLAIR ) 300 mg/2 mL auto-injector, Inject the contents of 1 pen (300 mg) under the skin every fourteen (14) days., Disp: 4 mL, Rfl: 11    omalizumab  75 mg/0.5 mL AtIn, Inject the contents of 1 pen (75 mg) under the skin every fourteen (14) days., Disp: 1 mL, Rfl: 11    pantoprazole  (PROTONIX ) 40 MG tablet, Take 1 tablet (40 mg total) by mouth daily., Disp: 90 tablet, Rfl: 3    polyethylene glycol (CLEARLAX) 17 gram/dose powder, Take as directed for extended bowel prep., Disp: 238 g, Rfl: 0    prucalopride (MOTEGRITY ) 2 mg Tab, Take 1 tablet (2 mg total) by mouth daily., Disp: 90 tablet, Rfl: 3    safety needles (BD ECLIPSE) 25 Christain x 1 Ndle, use for testosterone , Disp: 12 each, Rfl: 0    safety needles (BD SAFETYGLIDE NEEDLE) 18 Taiven x 1 1/2 Ndle, For drawing hormone injection, Disp: 25 each, Rfl: 0    syringe, disposable, (EASY TOUCH LUER LOCK SYRINGE) 1 mL Syrg, Use for weekly hormone injection., Disp: 4 each, Rfl: 0    syringe, disposable, (EASY TOUCH LUER LOCK SYRINGE) 1 mL Syrg, Use for testosterone , Disp: 12 each, Rfl: 0    syringe, disposable, 2.5 mL Syrg, Use weekly, Disp: 30 Syringe, Rfl: 2    testosterone  cypionate (DEPOTESTOTERONE CYPIONATE) 200 mg/mL injection, Inject 0.2 mL (40 mg total) into the muscle once a week., Disp: 12 mL, Rfl: 0    triamcinolone  (KENALOG ) 0.1 % cream, Apply topically Two (2) times a day., Disp: 15 g, Rfl: 0    varicella-zoster gE-AS01B (PF) (SHINGRIX ) injection, Inject 0.5 mL into the muscle., Disp: 0.5 mL, Rfl: 1    verapamil  (CALAN ) 40 MG tablet, Take 1 tablet (40 mg total) by mouth nightly for 14 days, THEN 1 tablet (40 mg total) two (2) times a day for 28 days., Disp: 60 tablet, Rfl: 2    emtricitabine -tenofovir  alafen (DESCOVY ) 200-25 mg tablet, Take 1 tablet by mouth daily., Disp: 90 tablet, Rfl: 3    ibuprofen  (MOTRIN ) 800 MG tablet, Take 1 tablet (800 mg total) by mouth every eight (8) hours as needed for pain., Disp: 60 tablet, Rfl: 2    Current Facility-Administered Medications:     omalizumab  (XOLAIR ) injection 300 mg, 300 mg, Subcutaneous, Q28 Days, [name], [name], [name], 300 mg at 05/24/22 1138

## 2024-03-15 ENCOUNTER — Emergency Department
Admit: 2024-03-15 | Discharge: 2024-03-15 | Disposition: A | Discharge status: Home / Self Care | Payer: BLUE CROSS/BLUE SHIELD

## 2024-03-15 DIAGNOSIS — M79602 Pain in left arm: Principal | ICD-10-CM

## 2024-03-15 DIAGNOSIS — G894 Chronic pain syndrome: Principal | ICD-10-CM

## 2024-03-15 MED ADMIN — diclofenac sodium (VOLTAREN) 1 % gel 2 g: 2 g | TOPICAL | @ 11:00:00 | Stop: 2024-03-15

## 2024-03-15 MED ADMIN — methocarbamol (ROBAXIN) tablet 1,000 mg: 1000 mg | ORAL | @ 10:00:00 | Stop: 2024-03-15

## 2024-03-15 MED ADMIN — ketorolac (TORADOL) injection 30 mg: 30 mg | INTRAMUSCULAR | @ 11:00:00 | Stop: 2024-03-15

## 2024-03-15 NOTE — Unmapped (Signed)
 Pt endorsing left arm pain and numbness that started this morning, from elbow distal to the hand. Also endorsing sciatica pain on the LLE from the buttocks to the ankle. Denies any recent injuries.

## 2024-03-15 NOTE — Unmapped (Signed)
 Emergency Department Provider Note        ED Clinical Impression     Final diagnoses:   Left arm pain (Primary)   Chronic pain syndrome       ED Assessment/Plan   [facility] 43 y.o. patient who  has a past medical history of Anxiety, Arthritis, Asthma (HHS-HCC), Autoimmune autonomic neuropathy, Breast cyst, Depression, Financial difficulties, Food intolerance (March 2018), Headache, tension-type, Lack of access to transportation, Migraines, Neuromuscular disorder    (CMS-HCC), Rectal bleeding, Restless leg syndrome, Scleroderma    (CMS-HCC), Seizures    (CMS-HCC) (07/27/2014), Sleep apnea treated with continuous positive airway pressure (CPAP), and Visual impairment. he presents with arm pain.     Discussion of Management with other Physicians, QHP or Appropriate Source:  N/A  Independent Interpretation of Studies: EKG  N/A; RAD  N/A, POCUS  N/A  External Records Reviewed: Patient's most recent outpatient clinic note  Escalation of Care, Consideration of Admission/Observation/Transfer:  N/A  Social determinants that significantly affected care: None applicable  Prescription drug(s) considered but not prescribed:   Diagnostic tests considered but not performed:   History obtained from other sources: None    Medical Decision Making  Risk  Prescription drug management.    I have explained to the patient that I most likely am not going to find out the root cause of his symptoms in the ED as he has already seen many specialists who per him are unsure of the cause of this pain.    Will attempt to help with acute pain with recommended robaxin  that he has not yet started, toradol , and voltaren  cream.  I also recommended he trial a tens unit for pain.             History     Chief Complaint   Patient presents with    Arm Pain     Patient is a 43 y.o. right-hand dominant adult with a PMH of anxiety, autoimmune autonomic neuropathy, migraines, injections to hips, scleroderma, RLS, depression, asthma, and seizures who presents for arm pain. The patient reports gradual, intermittent left arm pain which radiates from the distal elbow to the hand with associated numbness and tingling, which onset 3 days ago. He states the pain feels like it is on fire. He further endorses sciatica pain on the left lower extremity from the buttocks to the ankle.    On chart review, he was seen by his PCP yesterday for left piriformis syndrome and 1.5 months of a constant swollen ankle. Additionally, he was seen by PT on 03/13/2024 for worsening shoulder and hip pain. He was paused on PT. He was seen on 03/13/2024 by the pain clinic, which recommended an increase gabapentin  to 900 mg three times daily. Start Robaxin  500 mg four times daily as needed or 1000 mg twice daily as needed. Continue clonazepam  taper with PCP, seems like wean has paused. Ordered trigger point injections for the right trapezius and cervical paraspinal muscles. He has not began the Robaxin  prescribed.     His daily medications are notable for gabapentin  and clonazepam , with minimal relief of his symptoms with these medications. He has been on steroids previously, but his symptoms of gastroparesis worsens. He has tried ibuprofen , with no relief of his symptoms. He follows with the pain clinic for intermittent right hip pain and labral tear, but sees them every 3 months. He has been seen by a rheumatologist, neurologist, and cardiologist, and updates his testing every 3 months. Denies recent  injury or trauma to left arm or ankle, history of Raynaud's, or changes in medications.      Arm Pain      Past Medical History[1]    Past Surgical History[2]    Family History[3]    Social History[4]    Current Medications[5]    Review of Systems  10+ point review of systems negative except as detailed in HPI.      Physical Exam     BP 146/93  - Pulse 91  - Temp 36.7 ??C (98.1 ??F) (Oral)  - Resp 16  - Ht 162.6 cm (5' 4)  - Wt 70.8 kg (156 lb)  - LMP 07/20/2017 (Exact Date)  - SpO2 97%  - BMI 26.78 kg/m??     Physical Exam  Vitals and nursing note reviewed.   Constitutional:       General: He is not in acute distress.     Appearance: He is well-developed.   HENT:      Head: Normocephalic and atraumatic.      Right Ear: External ear normal.      Left Ear: External ear normal.   Eyes:      Conjunctiva/sclera: Conjunctivae normal.      Pupils: Pupils are equal, round, and reactive to light.   Neck:      Thyroid: No thyromegaly.      Vascular: No JVD.      Trachea: No tracheal deviation.   Cardiovascular:      Rate and Rhythm: Normal rate and regular rhythm.      Heart sounds: Normal heart sounds. No murmur heard.     No friction rub. No gallop.   Pulmonary:      Effort: Pulmonary effort is normal. No respiratory distress.      Breath sounds: Normal breath sounds.   Abdominal:      General: Bowel sounds are normal. There is no distension.      Palpations: Abdomen is soft. There is no mass.      Tenderness: There is no abdominal tenderness. There is no guarding or rebound.      Hernia: No hernia is present.   Musculoskeletal:         General: Tenderness present. No deformity or signs of injury. Normal range of motion.      Cervical back: Normal range of motion and neck supple.      Comments: Left upper extremity: Patient has tenderness to palpation over left anterior elbow to wrist.  No step-off no crepitus no overlying skin changes.  No fluctuance no warmth.  Patient has normal range of motion.  Distally neurovascularly intact.    Left lower extremity evaluated, no pitting edema no gross abnormality in comparison from left to right.   Lymphadenopathy:      Cervical: No cervical adenopathy.   Skin:     General: Skin is warm and dry.      Capillary Refill: Capillary refill takes less than 2 seconds.      Coloration: Skin is not pale.      Findings: No erythema or rash.   Neurological:      Mental Status: He is alert and oriented to person, place, and time.      Cranial Nerves: No cranial nerve deficit. Sensory: No sensory deficit.      Motor: No abnormal muscle tone.      Coordination: Coordination normal.      Deep Tendon Reflexes: Reflexes normal.   Psychiatric:  Behavior: Behavior normal.         Thought Content: Thought content normal.         Judgment: Judgment normal.                Documentation assistance was provided by Schuyler Gibney, Scribe, on March 15, 2024 at 5:49 AM for Ezella Guillaume, MD.    March 15, 2024 5:48 PM. Documentation assistance provided by the scribe. I was present during the time the encounter was recorded. The information recorded by the scribe was done at my direction and has been reviewed and validated by me.            [1]   Past Medical History:  Diagnosis Date    Anxiety     Arthritis     Asthma (HHS-HCC)     Autoimmune autonomic neuropathy     Breast cyst     Depression     Financial difficulties     Food intolerance March 2018    Headache, tension-type     Lack of access to transportation     Migraines     Neuromuscular disorder    (CMS-HCC)     Rectal bleeding     Restless leg syndrome     Scleroderma    (CMS-HCC)     Seizures    (CMS-HCC) 07/27/2014    Reigh neurology    Sleep apnea treated with continuous positive airway pressure (CPAP)     Visual impairment    [2]   Past Surgical History:  Procedure Laterality Date    BREAST LUMPECTOMY      CHOLECYSTECTOMY  08/29/2012    COSMETIC SURGERY      HYSTERECTOMY Bilateral     08/2017    MASTECTOMY      04/2018    OOPHORECTOMY  2005    laparotomy    PR COLONOSCOPY FLX DX W/COLLJ SPEC WHEN PFRMD Left 01/09/2024    Procedure: COLONOSCOPY, FLEXIBLE, PROXIMAL TO SPLENIC FLEXURE; DIAGNOSTIC, W/WO COLLECTION SPECIMEN BY BRUSH OR WASH;  Surgeon: Mariann Norleen Mt, MD;  Location: GI PROCEDURES [facility];  Service: Gastroenterology    PR CYSTOURETHROSCOPY N/A 08/30/2017    Procedure: CYSTOURETHROSCOPY (SEPARATE PROCEDURE);  Surgeon: Rosaline Elias Kennedy, MD;  Location: [facility] OR [facility];  Service: Advanced Laparoscopy    PR EXCISION TURBINATE,SUBMUCOUS Bilateral 01/04/2021    Procedure: SUBMUCOUS RESECTION INFERIOR TURBINATE, PARTIAL OR COMPLETE, ANY METHOD;  Surgeon: Adam Swaziland Kimple, MD;  Location: [facility] OR [facility];  Service: ENT    PR INCISE TENDON/MUSCLE,SHLDR,SINGLE Right 11/16/2021    Procedure: RIGHT PEC MINOR RELEASE;  Surgeon: Lawayne Patience, MD;  Location: MAIN OR [facility];  Service: Vascular    PR LAPAROSCOPY W TOT HYSTERECTUTERUS <=250 GRAM  W TUBE/OVARY N/A 08/30/2017    Procedure: LAPAROSCOPY, SURGICAL, WITH TOTAL HYSTERECTOMY, FOR UTERUS 250 G OR LESS; W/REMOVAL TUBE(S) AND/OR OVARY(S);  Surgeon: Rosaline Elias Kennedy, MD;  Location[facility] OR [facility];  Service: Advanced Laparoscopy    PR MASTECTOMY, SIMPLE, COMPLETE Bilateral 01/09/2020    Procedure: MASTECTOMY, SIMPLE, COMPLETE;  Surgeon: Pauline Welford Stapler, MD;  Location: MAIN OR [facility];  Service: Plastics    PR NEUROPLASTY BRACHIAL PLEXUS,OPEN Right 11/16/2021    Procedure: RIGHT TOS DECOMPRESSION;  Surgeon: Lawayne Patience, MD;  Location: MAIN OR ;  Service: Vascular    PR REPAIR OF NASAL SEPTUM N/A 01/04/2021    Procedure: SEPTOPLASTY OR SUBMUCOUS RESECTION, WITH OR WITHOUT CARTILAGE SCORING, CONTOURING OR REPLACEMENT WITH GRAFT;  Surgeon: Adam Swaziland Kimple,  MD;  Location: [facility] OR [facility];  Service: ENT    PR UPPER GI ENDOSCOPY,BIOPSY N/A 08/13/2019    Procedure: UGI ENDOSCOPY; WITH BIOPSY, SINGLE OR MULTIPLE;  Surgeon: Eleanor Dewey Sorrel, MD;  Location: [facility] PROCEDURES [facility];  Service: Gastroenterology    PR UPPER GI ENDOSCOPY,BIOPSY N/A 03/10/2023    Procedure: UGI ENDOSCOPY; WITH BIOPSY, SINGLE OR MULTIPLE;  Surgeon: Imelda Kerbs, MD;  Location: GI PROCEDURES [facility];  Service: Gastroenterology    PR UPPER GI ENDOSCOPY,BIOPSY Left 01/09/2024    Procedure: UGI ENDOSCOPY; WITH BIOPSY, SINGLE OR MULTIPLE;  Surgeon: Mariann Norleen Mt, MD;  Location: GI PROCEDURES [facility];  Service: Gastroenterology    SINUS SURGERY  12/20    Turbinate reduction   [3]   Family History  Problem Relation Age of Onset    Cancer Mother         breast    Thyroid disease Mother     Immunodeficiency Mother     Seizures Mother     Migraines Mother     Hypothyroidism Mother     Hypertension Father     Breast cancer Sister     Cancer Sister         breast    Cancer Maternal Uncle 8        Stomach Cancer    Ovarian cancer Maternal Grandmother     Breast cancer Maternal Grandmother     Cataracts Maternal Grandmother    [4]   Social History  Socioeconomic History    Marital status: Single     Spouse name: None    Number of children: None    Years of education: None    Highest education level: None   Tobacco Use    Smoking status: Never     Passive exposure: Never    Smokeless tobacco: Never   Vaping Use    Vaping status: Never Used   Substance and Sexual Activity    Alcohol  use: Not Currently    Drug use: Not Currently    Sexual activity: Not Currently     Partners: Male   Social History Narrative    ** Merged History Encounter **          Social Drivers of Health     Financial Resource Strain: Patient Declined (10/06/2023)    Overall Financial Resource Strain (CARDIA)     Difficulty of Paying Living Expenses: Patient declined   Food Insecurity: Patient Declined (10/06/2023)    Hunger Vital Sign     Worried About Running Out of Food in the Last Year: Patient declined     Ran Out of Food in the Last Year: Patient declined   Transportation Needs: Patient Declined (10/06/2023)    PRAPARE - Transportation     Lack of Transportation (Medical): Patient declined     Lack of Transportation (Non-Medical): Patient declined   Physical Activity: Unknown (07/27/2021)    Exercise Vital Sign     Days of Exercise per Week: 1 day   Stress: Stress Concern Present (07/27/2021)    [employer] of Occupational Health - Occupational Stress Questionnaire     Feeling of Stress : To some extent   Social Connections: Moderately Isolated (07/27/2021)    Social Connection and Isolation Panel     Frequency of Communication with Friends and Family: More than three times a week     Frequency of Social Gatherings with Friends and Family: More than three times a week     Attends  Religious Services: More than 4 times per year     Active Member of Clubs or Organizations: No     Attends Banker Meetings: Never     Marital Status: Never married   Housing: Patient Declined (10/06/2023)    Housing     Within the past 12 months, have you ever stayed: outside, in a car, in a tent, in an overnight shelter, or temporarily in someone else's home (i.e. couch-surfing)?: Patient refused     Are you worried about losing your housing?: Patient refused   [5]   Current Facility-Administered Medications   Medication Dose Route Frequency Provider Last Rate Last Admin    omalizumab  (XOLAIR ) injection 300 mg  300 mg Subcutaneous Q28 Days [name], [name], [name]   300 mg at 05/24/22 1138     Current Outpatient Medications   Medication Sig Dispense Refill    albuterol  2.5 mg /3 mL (0.083 %) nebulizer solution Inhale 3 mL (2.5 mg total) by nebulization every six (6) hours as needed for wheezing or shortness of breath. 90 mL 1    azelastine  (ASTELIN ) 137 mcg (0.1 %) nasal spray Instill 2 sprays into each nostril two (2) times a day as needed for rhinitis. 30 mL 1    bisacodyl  (DULCOLAX) 5 mg EC tablet Take 2 tablets (10 mg total) by mouth Take as directed. Take 2 tablets (10 mg total) as directed for bowel prep. 2 tablet 0    budesonide -formoterol  (SYMBICORT ) 160-4.5 mcg/actuation inhaler Inhale 1 puff by mouth Two (2) times a day. 10.2 g 5    cetirizine  (ZYRTEC ) 10 MG tablet Take 1 tablet (10 mg total) by mouth daily. 30 tablet 12    clonazePAM  (KLONOPIN ) 0.5 MG tablet Take 0.5 tablets (0.25 mg total) by mouth two (2) times a day as needed for anxiety. 30 tablet 5    dextroamphetamine -amphetamine  (ADDERALL) 20 mg tablet Take 1 tablet (20 mg total) by mouth two (2) times a day. 60 tablet 0    [START ON 04/01/2024] dextroamphetamine -amphetamine  (ADDERALL) 20 mg tablet Take 1 tablet (20 mg total) by mouth two (2) times a day. 60 tablet 0    doxycycline  (VIBRA -TABS) 100 MG tablet Take 2 tablets (200 mg total) by mouth once as needed (ideally within 24 hours, but no later than 72 hours after condomless sex) for up to 10 doses. Take with full glass of water, and remain upright for 30 minutes. 20 tablet 11    emtricitabine -tenofovir  alafen (DESCOVY ) 200-25 mg tablet Take 1 tablet by mouth daily. 90 tablet 3    EPINEPHrine  (EPIPEN ) 0.3 mg/0.3 mL injection Inject 0.3 mL (0.3 mg total) under the skin once for 1 dose. 2 each 0    gabapentin  (NEURONTIN ) 250 mg/5 mL oral solution Take 18 mL (900 mg total) by mouth Three (3) times a day. 1620 mL 2    ibuprofen  (MOTRIN ) 800 MG tablet Take 1 tablet (800 mg total) by mouth every eight (8) hours as needed for pain. 60 tablet 2    inhalational spacing device Spcr Use as directed with albuterol  and symbicort  1 each 1    ipratropium (ATROVENT ) 42 mcg (0.06 %) nasal spray 1-2 sprays in each nostril as needed for drainage, up to 4 time daily. 15 mL 2    methocarbamol  (ROBAXIN ) 500 MG tablet Take 1 tablet (500 mg total) by mouth four (4) times a day. Alternatively, take 2 tablets twice daily. 120 tablet 2    metoclopramide  (REGLAN ) 10 MG tablet  Take 1/2 tablet (5 mg total) by mouth Three (3) times a day with a meal. 135 tablet 3    nebulizer and compressor (COMP-AIR NEBULIZER COMPRESSOR) Devi 1 each by Miscellaneous route nightly as needed. 1 each 0    needle, disp, 25 Gamaliel (BD REGULAR BEVEL NEEDLES) 25 Cletus x 5/8 Ndle For subcutaneous hormone injection. 25 each 0    omalizumab  (XOLAIR ) 300 mg/2 mL auto-injector Inject the contents of 1 pen (300 mg) under the skin every fourteen (14) days. 4 mL 11    omalizumab  75 mg/0.5 mL AtIn Inject the contents of 1 pen (75 mg) under the skin every fourteen (14) days. 1 mL 11    polyethylene glycol (CLEARLAX) 17 gram/dose powder Take as directed for extended bowel prep. 238 g 0    prucalopride (MOTEGRITY ) 2 mg Tab Take 1 tablet (2 mg total) by mouth daily. 90 tablet 3    safety needles (BD ECLIPSE) 25 Levone x 1 Ndle use for testosterone  12 each 0    safety needles (BD SAFETYGLIDE NEEDLE) 18 Jun x 1 1/2 Ndle For drawing hormone injection 25 each 0    syringe, disposable, (EASY TOUCH LUER LOCK SYRINGE) 1 mL Syrg Use for weekly hormone injection. 4 each 0    syringe, disposable, (EASY TOUCH LUER LOCK SYRINGE) 1 mL Syrg Use for testosterone  12 each 0    syringe, disposable, 2.5 mL Syrg Use weekly 30 Syringe 2    testosterone  cypionate (DEPOTESTOTERONE CYPIONATE) 200 mg/mL injection Inject 0.2 mL (40 mg total) into the muscle once a week. 12 mL 0    triamcinolone  (KENALOG ) 0.1 % cream Apply topically Two (2) times a day. 15 g 0    varicella-zoster gE-AS01B (PF) (SHINGRIX ) injection Inject 0.5 mL into the muscle. 0.5 mL 1    verapamil  (CALAN ) 40 MG tablet Take 1 tablet (40 mg total) by mouth nightly for 14 days, THEN 1 tablet (40 mg total) two (2) times a day for 28 days. 60 tablet 2        Saul Ezella Jansky, MD  03/15/24 [phone]

## 2024-03-16 ENCOUNTER — Emergency Department
Admit: 2024-03-16 | Discharge: 2024-03-17 | Disposition: A | Discharge status: Home / Self Care | Payer: BLUE CROSS/BLUE SHIELD

## 2024-03-16 DIAGNOSIS — M79602 Pain in left arm: Principal | ICD-10-CM

## 2024-03-16 MED ORDER — PROCHLORPERAZINE MALEATE 10 MG TABLET
ORAL_TABLET | Freq: Two times a day (BID) | ORAL | 0 refills | 5.00000 days | Status: CP | PRN
Start: 2024-03-16 — End: 2024-03-23
  Filled 2024-03-26: qty 10, 5d supply, fill #0

## 2024-03-16 NOTE — Unmapped (Signed)
 Pt c/o LUE pain that radiates to chest.

## 2024-03-16 NOTE — Unmapped (Signed)
 Patient was received in signout from prior ED provider:         ED Course as of 03/16/24 1715   Fri Mar 15, 2024   0913 I received signout on this patient at 7 AM from Dr. Maurilio.  Briefly this is a 43 year old patient that presents with right sciatic pain that is chronic.  Also has left shoulder pain.  Was treated with Toradol  and Robaxin .  The patient takes 2700 of gabapentin  daily, and has a follow-up appointment for injections in the piriformis/sciatic region.  On reevaluation, patient still has discomfort, however we discussed increasing gabapentin  to max dose, since there are no side effects currently.  Patient understands agrees the plan.         Radiology     No orders to display       Labs     Labs Reviewed - No data to display

## 2024-03-16 NOTE — Unmapped (Signed)
 [facility]  Emergency Department Provider Note    ED Clinical Impression     Final diagnoses:   Left arm pain (Primary)       HPI, ED Course, Assessment and Plan     Initial Clinical Impression:    March 16, 2024 7:52 PM   Vincent [place] is a 43 y.o. adult with past medical history of anxiety, gastroparesis, autoimmune autonomic neuropathy, migraines, injections to hips, scleroderma, RLS, depression, asthma, and seizure presenting with arm pain. The patient reports 4 days of intermittent, dull left arm pain which radiates to his elbow, hand, and chest with associated numbness and tingling. When the pain onsets, he endorses nausea and lightheadedness secondary to the pain.  No episodes of syncope.  No episodes of emesis.  The patient states he has a history of thoracic outlet syndrome on the right and had a decompression.    On chart review, he was seen yesterday in the [facility] ED for similar symptoms. He was given Robaxin , Toradol , and Voltaren  cream. He was discharged with instructions to start his previously prescribed Robaxin , TENS unit, increase gabapentin  dosage, and Voltaren  gel.  He has been seen by cardiologist, rheumatologist, neurologist.  Per pain management note 4 days ago, he is weaning off benzos and discontinued opioids after multiple years.    He states since his visit, his pain has began to radiate down his left arm, up to his shoulder, across his chest, and up to his left neck and jaw. He has increased his gabapentin  dosage and started Robaxin , however, his symptoms have not improved. He endorses difficulty tolerating Robaxin  and gabapentin  simultaneously secondary to the sedation properties. He states previously prescribed Zofran  makes him drowsy. He states he has not experienced these symptoms previously. Of note, he reports left tooth pain and swollen gums, with an abscess, and has went to the [facility] dental school and actively being treated for this with follow-up scheduled. His next dental appointment is at the end of October.  He denies any frank dental pain.    Denies any injury to the arm since last evaluation.  No new numbness or weakness.  He denies any chest pain or jaw pain outside of episodes when the arm pain is at its worst.  He does feel that all of these pains are related.  He describes his pain as a dull ache but also like fire.  It comes and goes in waves.    BP 126/90  - Pulse 83  - Temp 36.7 ??C (98.1 ??F) (Oral)  - Resp 16  - Ht 162.6 cm (5' 4)  - Wt 70.8 kg (156 lb)  - LMP 07/20/2017 (Exact Date)  - SpO2 100%  - BMI 26.78 kg/m??   Initial vital signs are notable for tachycardia to 102.  Pertinent physical exam: On my initial evaluation, the patient is nontoxic appearing, in no acute distress.  He has tenderness to palpation throughout the left arm.  He has some focal tenderness of the elbow but he is ranging it fully and his shoulder and his wrist and all his fingers as well.  He has no erythema deformity swelling or rash.  He has some dysesthesia on exam.  He has poor dentition but no obvious periapical abscess at this time.  Intact distal pulses and capillary refill.    Medical Decision Making  This is a 43 y.o. adult with history as above presenting with left arm pain in the setting of visit yesterday for same with  new addition of radiation to the chest and jaw.  He denies any injury and I do not feel he requires x-rays or imaging at this time.  He does not have a dental emergency today.  He does not show signs on exam of a vascular emergency.  He does not have signs or symptoms of an infectious etiology.  His EKG does not show signs of ischemia and he has no chest pain outside of severe arm pain episodes.  As such I have a low clinical concern for ACS and I do not feel he requires labs today.  I had a long discussion with the patient regarding his pain and the fact that the emergency department is not the best place to manage it outside of symptoms of infectious, traumatic, vascular, strokelike symptoms.  I discussed red flag symptoms that would warrant reevaluation in the ER.  I expressed that I wish we were in a better place controlling his pain with the medicines he has tried, but I feel that adding further pain medications would expose him to unnecessary risk and would be better discussed with his pain doctors.  He is open to trying another injection.  He will follow-up with his care team outpatient.  Given the new component of nausea in association with this pain, and reported history of poor response to Zofran  in the past, I will prescribe him an alternate antiemetic for him to try.  He endorses understanding and agreement with the plan.    Further ED updates and updates to plan as per ED Course below:    ED Course:  ED Course as of 03/17/24 0404   Sat Mar 16, 2024   1952 EKG on my interpretation with normal sinus rhythm, rate 90, normal axis, appropriate intervals, without ST-T changes concerning for acute ischemia or abnormal T wave inversion or poor R wave progression       MDM Elements  I have reviewed recent and relevant previous record, including: Outpatient notes - [facility] Visit on 03/14/2024 for PMH        Social Determinants that significantly affected care: None  Discussion of Management with other Physicians, QHP or Appropriate Source: None  Escalation of Care including OBS/Admission/Transfer was considered: However, patient was determined to be appropriate for outpatient management.  ____________________________________________    The case was discussed with the attending physician who is in agreement with the above assessment and plan.     Past History     PAST MEDICAL HISTORY/PAST SURGICAL HISTORY:   Past Medical History[1]    Past Surgical History[2]    MEDICATIONS:     Current Facility-Administered Medications:     omalizumab  (XOLAIR ) injection 300 mg, 300 mg, Subcutaneous, Q28 Days, [name], [name], [name], 300 mg at 05/24/22 1138    Current Outpatient Medications:     albuterol  2.5 mg /3 mL (0.083 %) nebulizer solution, Inhale 3 mL (2.5 mg total) by nebulization every six (6) hours as needed for wheezing or shortness of breath., Disp: 90 mL, Rfl: 1    azelastine  (ASTELIN ) 137 mcg (0.1 %) nasal spray, Instill 2 sprays into each nostril two (2) times a day as needed for rhinitis., Disp: 30 mL, Rfl: 1    bisacodyl  (DULCOLAX) 5 mg EC tablet, Take 2 tablets (10 mg total) by mouth Take as directed. Take 2 tablets (10 mg total) as directed for bowel prep., Disp: 2 tablet, Rfl: 0    budesonide -formoterol  (SYMBICORT ) 160-4.5 mcg/actuation inhaler, Inhale 1 puff  by mouth Two (2) times a day., Disp: 10.2 g, Rfl: 5    cetirizine  (ZYRTEC ) 10 MG tablet, Take 1 tablet (10 mg total) by mouth daily., Disp: 30 tablet, Rfl: 12    clonazePAM  (KLONOPIN ) 0.5 MG tablet, Take 0.5 tablets (0.25 mg total) by mouth two (2) times a day as needed for anxiety., Disp: 30 tablet, Rfl: 5    dextroamphetamine -amphetamine  (ADDERALL) 20 mg tablet, Take 1 tablet (20 mg total) by mouth two (2) times a day., Disp: 60 tablet, Rfl: 0    [START ON 04/01/2024] dextroamphetamine -amphetamine  (ADDERALL) 20 mg tablet, Take 1 tablet (20 mg total) by mouth two (2) times a day., Disp: 60 tablet, Rfl: 0    doxycycline  (VIBRA -TABS) 100 MG tablet, Take 2 tablets (200 mg total) by mouth once as needed (ideally within 24 hours, but no later than 72 hours after condomless sex) for up to 10 doses. Take with full glass of water, and remain upright for 30 minutes., Disp: 20 tablet, Rfl: 11    emtricitabine -tenofovir  alafen (DESCOVY ) 200-25 mg tablet, Take 1 tablet by mouth daily., Disp: 90 tablet, Rfl: 3    EPINEPHrine  (EPIPEN ) 0.3 mg/0.3 mL injection, Inject 0.3 mL (0.3 mg total) under the skin once for 1 dose., Disp: 2 each, Rfl: 0    gabapentin  (NEURONTIN ) 250 mg/5 mL oral solution, Take 18 mL (900 mg total) by mouth Three (3) times a day., Disp: 1620 mL, Rfl: 2    ibuprofen  (MOTRIN ) 800 MG tablet, Take 1 tablet (800 mg total) by mouth every eight (8) hours as needed for pain., Disp: 60 tablet, Rfl: 2    inhalational spacing device Spcr, Use as directed with albuterol  and symbicort , Disp: 1 each, Rfl: 1    ipratropium (ATROVENT ) 42 mcg (0.06 %) nasal spray, 1-2 sprays in each nostril as needed for drainage, up to 4 time daily., Disp: 15 mL, Rfl: 2    methocarbamol  (ROBAXIN ) 500 MG tablet, Take 1 tablet (500 mg total) by mouth four (4) times a day. Alternatively, take 2 tablets twice daily., Disp: 120 tablet, Rfl: 2    metoclopramide  (REGLAN ) 10 MG tablet, Take 1/2 tablet (5 mg total) by mouth Three (3) times a day with a meal., Disp: 135 tablet, Rfl: 3    nebulizer and compressor (COMP-AIR NEBULIZER COMPRESSOR) Devi, 1 each by Miscellaneous route nightly as needed., Disp: 1 each, Rfl: 0    needle, disp, 25 Selig (BD REGULAR BEVEL NEEDLES) 25 Russ x 5/8 Ndle, For subcutaneous hormone injection., Disp: 25 each, Rfl: 0    omalizumab  (XOLAIR ) 300 mg/2 mL auto-injector, Inject the contents of 1 pen (300 mg) under the skin every fourteen (14) days., Disp: 4 mL, Rfl: 11    omalizumab  75 mg/0.5 mL AtIn, Inject the contents of 1 pen (75 mg) under the skin every fourteen (14) days., Disp: 1 mL, Rfl: 11    polyethylene glycol (CLEARLAX) 17 gram/dose powder, Take as directed for extended bowel prep., Disp: 238 g, Rfl: 0    prochlorperazine (COMPAZINE) 10 MG tablet, Take 1 tablet (10 mg total) by mouth two (2) times a day as needed for nausea for up to 7 days., Disp: 10 tablet, Rfl: 0    prucalopride (MOTEGRITY ) 2 mg Tab, Take 1 tablet (2 mg total) by mouth daily., Disp: 90 tablet, Rfl: 3    safety needles (BD ECLIPSE) 25 Decari x 1 Ndle, use for testosterone , Disp: 12 each, Rfl: 0    safety needles (BD SAFETYGLIDE NEEDLE) 18 Amontae  x 1 1/2 Ndle, For drawing hormone injection, Disp: 25 each, Rfl: 0    syringe, disposable, (EASY TOUCH LUER LOCK SYRINGE) 1 mL Syrg, Use for weekly hormone injection., Disp: 4 each, Rfl: 0 syringe, disposable, (EASY TOUCH LUER LOCK SYRINGE) 1 mL Syrg, Use for testosterone , Disp: 12 each, Rfl: 0    syringe, disposable, 2.5 mL Syrg, Use weekly, Disp: 30 Syringe, Rfl: 2    testosterone  cypionate (DEPOTESTOTERONE CYPIONATE) 200 mg/mL injection, Inject 0.2 mL (40 mg total) into the muscle once a week., Disp: 12 mL, Rfl: 0    triamcinolone  (KENALOG ) 0.1 % cream, Apply topically Two (2) times a day., Disp: 15 g, Rfl: 0    varicella-zoster gE-AS01B (PF) (SHINGRIX ) injection, Inject 0.5 mL into the muscle., Disp: 0.5 mL, Rfl: 1    verapamil  (CALAN ) 40 MG tablet, Take 1 tablet (40 mg total) by mouth nightly for 14 days, THEN 1 tablet (40 mg total) two (2) times a day for 28 days., Disp: 60 tablet, Rfl: 2    ALLERGIES:   Latex, Penicillins, Shellfish containing products, and Trileptal [oxcarbazepine]    SOCIAL HISTORY:   Social History     Tobacco Use    Smoking status: Never     Passive exposure: Never    Smokeless tobacco: Never   Substance Use Topics    Alcohol  use: Not Currently       FAMILY HISTORY:  Family History[3]       Review of Systems   A review of systems was performed and relevant portions were as noted above in HPI     Physical Exam     VITAL SIGNS:    BP 126/90  - Pulse 83  - Temp 36.7 ??C (98.1 ??F) (Oral)  - Resp 16  - Ht 162.6 cm (5' 4)  - Wt 70.8 kg (156 lb)  - LMP 07/20/2017 (Exact Date)  - SpO2 100%  - BMI 26.78 kg/m??     Constitutional:   Alert and oriented. In no acute distress. Please see pertinent exam above as well.  Head:   Normocephalic and atraumatic  Eyes:   Conjunctivae are normal, EOMI, PERRL  ENT:   No notable congestion, mucous membranes moist, external ears normal, no notable stridor  Cardiovascular:   Rate as vitals above. Appears warm and well perfused  Respiratory:   Normal respiratory effort. Breath sounds are normal.  Gastrointestinal:   Soft, non-distended, and nontender without rebound or guarding.   Genitourinary:   Deferred  Musculoskeletal:    Normal range of motion in all extremities. No tenderness or edema noted in B/L lower extremities  Neurologic:   No gross focal neurologic deficits beyond baseline are appreciated  Skin:   Skin is warm, dry and intact    Radiology     No orders to display       Labs     Labs Reviewed - No data to display    Pertinent labs & imaging results that were available during my care of the patient were reviewed by me and considered in my medical decision making. Labs and radiology studies included in my note may not constitute all ordered/reviewied labs during this encounter (see chart for details).      Please note- This chart has been created using [employer]. Chart creation errors have been sought, but may not always be located and such creation errors, especially pronoun confusion, do NOT reflect on the standard of medical care.    Documentation assistance was  provided by Schuyler Gibney, Scribe on March 16, 2024 at 7:53 PM for Rollene Hy, MD.    March 17, 2024 4:14 AM. Documentation assistance provided by the scribe. I was present during the time the encounter was recorded. The information recorded by the scribe was done at my direction and has been reviewed and validated by me.            [1]   Past Medical History:  Diagnosis Date    Anxiety     Arthritis     Asthma (HHS-HCC)     Autoimmune autonomic neuropathy     Breast cyst     Depression     Financial difficulties     Food intolerance March 2018    Headache, tension-type     Lack of access to transportation     Migraines     Neuromuscular disorder    (CMS-HCC)     Rectal bleeding     Restless leg syndrome     Scleroderma    (CMS-HCC)     Seizures    (CMS-HCC) 07/27/2014    [facility]igh neurology    Sleep apnea treated with continuous positive airway pressure (CPAP)     Visual impairment    [2]   Past Surgical History:  Procedure Laterality Date    BREAST LUMPECTOMY      CHOLECYSTECTOMY  08/29/2012    COSMETIC SURGERY      HYSTERECTOMY Bilateral     08/2017    MASTECTOMY 04/2018    OOPHORECTOMY  2005    laparotomy    PR COLONOSCOPY FLX DX W/COLLJ SPEC WHEN PFRMD Left 01/09/2024    Procedure: COLONOSCOPY, FLEXIBLE, PROXIMAL TO SPLENIC FLEXURE; DIAGNOSTIC, W/WO COLLECTION SPECIMEN BY BRUSH OR WASH;  Surgeon: Mariann Norleen Mt, MD;  [facility]: [facility] PROC[facility];  Service: Gastroenterology    PR CYSTOURETHROSCOPY N/A 08/30/2017    Procedure: CYSTOURETHROSCOPY (SEPARATE PROCEDURE);  Surgeon: Rosaline Elias Kennedy, MD;  [facility]: [facility] OR [facility];  Service: Advanced Laparoscopy    PR EXCISION TURBINATE,SUBMUCOUS Bilateral 01/04/2021    Procedure: SUBMUCOUS RESECTION INFERIOR TURBINATE, PARTIAL OR COMPLETE, ANY METHOD;  Surgeon: Adam Swaziland Kimple, MD;  Location: ASC OR [facility];  Service: ENT    PR INCISE TENDON/MUSCLE,SHLDR,SINGLE Right 11/16/2021    Procedure: RIGHT PEC MINOR RELEASE;  Surgeon: Lawayne Patience, MD;  Location: MAIN OR [place];  Service: Vascular    PR LAPAROSCOPY W TOT HYSTERECTUTERUS <=250 GRAM  W TUBE/OVARY N/A 08/30/2017    Procedure: LAPAROSCOPY, SURGICAL, WITH TOTAL HYSTERECTOMY, FOR UTERUS 250 G OR LESS; W/REMOVAL TUBE(S) AND/OR OVARY(S);  Surgeon: Rosaline Elias Kennedy, MD;  [facility]: [facility];  Service: Advanced Laparoscopy    PR MASTECTOMY, SIMPLE, COMPLETE Bilateral 01/09/2020    Procedure: MASTECTOMY, SIMPLE, COMPLETE;  Surgeon: Pauline Welford Stapler, MD;  Location: MAIN OR [place];  Service: Plastics    PR NEUROPLASTY BRACHIAL PLEXUS,OPEN Right 11/16/2021    Procedure: RIGHT TOS DECOMPRESSION;  Surgeon: Lawayne Patience, MD;  Location: MAIN OR [employer];  Service: Vascular    PR REPAIR OF NASAL SEPTUM N/A 01/04/2021    Procedure: SEPTOPLASTY OR SUBMUCOUS RESECTION, WITH OR WITHOUT CARTILAGE SCORING, CONTOURING OR REPLACEMENT WITH GRAFT;  Surgeon: Adam Swaziland Kimple, MD;  Location: ASC OR [facility];  Service: ENT    PR UPPER GI ENDOSCOPY,BIOPSY N/A 08/13/2019    Procedure: UGI ENDOSCOPY; WITH BIOPSY, SINGLE OR MULTIPLE;  Surgeon: Eleanor Dewey Sorrel, MD; Location: [facility] PROCED[facility];  Service: Gastroenterology    PR UPPER GI  ENDOSCOPY,BIOPSY N/A 03/10/2023    Procedure: UGI ENDOSCOPY; WITH BIOPSY, SINGLE OR MULTIPLE;  Surgeon: Imelda Kerbs, MD;  Location: [facility] PROCED[facility];  Service: Gastroenterology    PR UPPER GI ENDOSCOPY,BIOPSY Left 01/09/2024    Procedure: UGI ENDOSCOPY; WITH BIOPSY, SINGLE OR MULTIPLE;  Surgeon: Mariann Norleen Mt, MD;  Location: [facility];  Service: Gastroenterology    SINUS SURGERY  12/20    Turbinate reduction   [3]   Family History  Problem Relation Age of Onset    Cancer Mother         breast    Thyroid disease Mother     Immunodeficiency Mother     Seizures Mother     Migraines Mother     Hypothyroidism Mother     Hypertension Father     Breast cancer Sister     Cancer Sister         breast    Cancer Maternal Uncle 71        Stomach Cancer    Ovarian cancer Maternal Grandmother     Breast cancer Maternal Grandmother     Cataracts Maternal Grandmother         Othel Rollene BRAVO, MD  Resident  03/17/24 [phone]

## 2024-03-17 MED ADMIN — oxyCODONE (ROXICODONE) immediate release tablet 5 mg: 5 mg | ORAL | @ 01:00:00 | Stop: 2024-03-16

## 2024-03-18 ENCOUNTER — Encounter
Admit: 2024-03-18 | Discharge: 2024-03-18 | Payer: BLUE CROSS/BLUE SHIELD | Attending: Student in an Organized Health Care Education/Training Program | Primary: Student in an Organized Health Care Education/Training Program

## 2024-03-18 DIAGNOSIS — Z2981 Encounter for pre-exposure prophylaxis for HIV: Principal | ICD-10-CM

## 2024-03-18 LAB — HIV ANTIGEN/ANTIBODY COMBO: HIV ANTIGEN/ANTIBODY COMBO: NONREACTIVE

## 2024-03-19 LAB — SYPHILIS SCREEN: SYPHILIS RPR SCREEN: NONREACTIVE

## 2024-03-19 NOTE — Unmapped (Signed)
 [facility]AP[facility] AT [facility]  OUTPATIENT PHYSICAL THERAPY  03/19/2024  Note Type: Treatment Note       Patient Name: Vincent Waters  Date of Birth:[birth year]  Diagnosis:   Encounter Diagnoses   Name Primary?    Chronic right-sided low back pain without sciatica Yes    Low back pain radiating to right lower extremity          Referring MD:  Debrah Morene Cadet,*     Date of Onset of Impairment-08/23/2016  Date PT Care Plan Established or Reviewed-03/06/2024  Date PT Treatment Started-08/24/2023   Visit Count: 25  Plan of Care Effective Date:          Assessment/Plan:    Assessment  Assessment details:    Based on Rossie's subjective report, on the surface, it sounds like he had a transient nerve irritation that lasted for a few days, but it has for the most part, resolved since then.  However, he is reporting that he also had some other systemic symptoms (swollen lymph nodes, feeling flush, chest pains, etc.) that could be pointing to larger issue.  We agreed that, at this point, we are reaching the max of what we can do with physical therapy.  We will complete our final appointment on 10/14 and then discharge him for a while.  He understands that he will need to be diligent with his strength program moving forward.  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  He agreed to continue working on his strengthening independently.  All questions answered.  This patient requires skilled physical therapy services to address the outlined impairments in order to return to his desired level of function.      Impairments: pain, muscular restrictions, hypertonicity, core weakness, impaired ADLs, joint restriction, decreased strength and gait deviation                      Therapy Goals      Goals:      Goals: Updated 03/06/2024.   In 12 weeks:  Goal #1: Patient will be independent with progressively monitored home exercise program.   Baseline/Current: COMPLETE.   Patient is familiar with home exercise program and performs it consistently.     Goal #2: Patient will demonstrate lumbar ROM within normal limits without pain in order to perform daily functional activities such as bending to pick an object off the ground, twisting to reach into the patient's backseat or bend backwards in order to lift an object overhead.   Baseline/Current: COMPLETE.  Patient now has normal lumbar ROM with reports of tightness (see ROM testing).     Goal #3: Patient will demonstrate left hip (on previous notes listed as incorrect side, now corrected 03/06/2024) External Rotation strength 5/5 during manual muscle testing for improved pelvic stability for better walking, squatting and negotiating stairs, reporting pain </= 2/10 with those activities.   Baseline/Current: NEARLY COMPLETE (noted 03/06/2024).  Patient now has 4+/5 left hip ER strength noted on manual muscle strength with all other hip strength testing normal (see manual muscle testing).      Goal #4: Patient will be able to lift at least 75 lb from floor to waist with good lifting mechanics in order to demonstrate good core and trunk strength so he can lift grocery bags and items  around the house with pain </= 2/10.   Baseline/Current: PARTIALLY COMPLETE.  Patient reports that he can lift a case of water (up to 40-50 lb) without a problem.    Goal #5: Patient will report improvement in symptoms while sitting and ability to sit up to 1 hour with minimal symptoms.  Baseline/Current: PARTIALLY COMPLETE (noted 03/06/2024).  Patient reports that he now sit for up to 30 minutes before onset of symptoms.    Goal #6: Patient will demonstrate improved posture without cuing for pain </= 2/10.  Baseline/Current: NEARLY COMPLETE (noted 03/06/2024)  Patient is now much more aware of his body position and is doing a much better job controlling his increased lordosis in standing and sitting positions.    Goal #7: Patient will be able to hold a front plank for >/= 2 minutes to demonstrate improved core strength for improved tolerance to sitting and increased stability while walking.  Baseline/Current: NEARLY COMPLETE (noted 03/06/2024).  Patient can now hold a front plank for ~ 76 seconds, reporting that he's feeling much stronger.    Goal #8: Patient will be able to hold a side plank >/= 45 seconds to demonstrate improved core/trunk strength for improved sitting tolerance and increased stability while walking.  Baseline/Current: NEARLY COMPLETE (noted 03/06/2024).  Patient can hold a side plank bilaterally for ~ 40 seconds.    Plan    Therapy options: will be seen for skilled physical therapy services    Planned therapy interventions: 20560, 20561-Dry Needling 1-2, 3+ areas, 97010-Cold Packs/Hot Packs, 97012-Mechanical Traction, 97032, G0283-Electrical Stimulation (unattended, attended), 97110-Therapeutic Exercises, 97112-Neuromuscular Re-education, 97116-Gait Training, 97140-Manual Therapy, 97164-Re-evaluation (PT) and 97530-Therapeutic Activities      Frequency: 1x week    Duration in weeks: 8    Education provided to: patient.    Education provided: Surveyor, minerals, HEP and Treatment options and plan    Education results: verbalized good understanding, needs reinforcement, demonstrates understanding and needs further instruction.    Communication/Consultation: N/A.    Next visit plan:        Progress core strengthening, especially plank. Manual therapy/TDN as needed to calm left hip external rotators.        Total Timed Code Minutes: 35    Treatment rendered today:      Therapeutic Exercise: 30 minutes  Patient Education: Plan of Care and Home Exercise Program.    Manual Therapy: 5 minutes  [facility]  was informed of the risks associated with dry needling and he signed consent form.  Patient screened for any contraindications to needling including pregnancy, infection, antibiotic use, anticoagulants use, autoimmune issues, uncontrolled diabetes, and active cancer/treatment and does not have any of these conditions.  DRY NEEDLING FOR RELEASING IDENTIFIED TRIGGER POINTS TO FACILITATE MANUAL THERAPY TECHNIQUES AND ALLOW MUSCLE TO RETURN TO OPTIMAL LENGTH (NO CHARGE).  Right upper trapezius trigger point with 50mm x .30mm with Pinch/Pull Technique established for safety - Local Twitch Response produced    Plan details: Access Code: [email or web address]  URL: [email or web address]  Date: 01/17/2024  Prepared by: Karena Harbour    Exercises  - Supine Posterior Pelvic Tilt  - 1 x daily - 7 x weekly - 1 sets - 20 reps - 5 hold  - Sidelying Thoracolumbar Mobilisation with Towel Roll  - 2 x daily - 7 x weekly - 1 sets - 1 reps - 5 minutes hold  - Clamshell with Resistance (Mirrored)  - 1 x daily - 7 x weekly - 2 sets - 10 reps -  3 hold  - Sidelying Hip Abduction (Mirrored)  - 1 x daily - 7 x weekly - 2 sets - 10 reps  - Hooklying Transversus Abdominis Palpation  - 1 x daily - 7 x weekly - 1 sets - 15 reps - 5 hold  - Supine March  - 1 x daily - 7 x weekly - 2 sets - 10 reps  - Supine 90/90 Alternating Toe Touch  - 1 x daily - 7 x weekly - 2 sets - 10 reps  - Full Plank  - 1 x daily - 7 x weekly - 1 sets - 3 reps - 30-45 hold  - Supine Bridge with Leg Extension  - 1 x daily - 7 x weekly - 2 sets - 10 reps - 5 hold  - Bird Dog  - 1 x daily - 7 x weekly - 2 sets - 10 reps  - Supine Dead Bug with Leg Extension  - 1 x daily - 7 x weekly - 2 sets - 10 reps  - Quadruped Hip Abduction with Resistance Loop  - 1 x daily - 7 x weekly - 2 sets - 10 reps  - Sternocleidomastoid Stretch  - 1 x daily - 7 x weekly - 1 sets - 3 reps - 30 hold  - Supine Piriformis Stretch with Foot on Ground  - 1 x daily - 7 x weekly - 3 sets - 10 reps  - Supine Bilateral Isometric ER With Mobilization Belt  - 1 x daily - 7 x weekly - 15 sets - 10 reps - 5 hold  - Supine Bilateral Isometric Hip Abduction With Mobilization Belt  - 1 x daily - 7 x weekly - 3 sets - 10 reps  - Supine Figure 4 Piriformis Stretch  - 1 x daily - 7 x weekly - 1 sets - 10 reps - 10 hold  - Supine LE Neural Mobilization  - 1 x daily - 7 x weekly - 2 sets - 10 reps  - Supine Gluteal Sets  - 1 x daily - 7 x weekly - 1 sets - 20 reps - 5 hold  - Unilateral Isometric Glute Activation Bent Knee - Left  - 1 x daily - 7 x weekly - 3 sets - 10 reps  - Curl Up with Arms Crossed  - 1 x daily - 7 x weekly - 2 sets - 10 reps - 1 hold  - Side Plank on Elbow  - 1 x daily - 7 x weekly - 1 sets - 3 reps - 10 hold      Subjective:     History of Present Condition     Date of onset:  08/23/2016    Date is approximation?: yes    Explanation:  Patient reports onset of symptoms in 2018.    History of Present Condition/Chief Complaint:  Mr. Hemann is a 43 y.o. right hand dominant adult (male to male transition in process) who presents to outpatient physical therapy with chronic low back pressure, tightness and pain that began in 2018.  Patient cannot remember any specific traumatic mechanism of injury that may have been the result of his symptoms, but he does report that he broke his right leg 2x when he was younger.  The first one was when he was a baby.  The second time he broke it falling off the couch when he was 8.  He can't remember if it was his femur or tibia and he hasn't had  a recent x-ray.  Subjective:  Mr. Querry reports that he had a strange onset of left shoulder/neck pain and left elbow/hand numbness that he needed to go to the ER for, on 9/26 and 9/27.  He is feeling a bit better now, but he's unsure what was going on at the time.  He has continued to work on his strengthening and he is now up to 1 minute and change for his [employer].  His hip isn't feeling too bad and he has the usual tightness in his shoulder.  Pain:     Current pain rating:  3  Location:  Left posterior hip radiating down the back of his leg    Quality:  Aching and tightness    Progression:  Improved      Objective:     Static Posture Assessment   Lumbar Spine-Increased lordosis.     Palpation   Left Hip   Hypertonic in the piriformis.  Tenderness of the piriformis.    Strength  Left Hip   Planes of Motion   Flexion: 5  Extension: 5  Abduction: 5  External rotation: 4+  Internal rotation: 5    Tests     Functional Assessment   Functional Assessment Comments-Front Plank: 76 seconds  Side Plank:  Right: 40 seconds  Left: 40 seconds                                  I attest that I have reviewed the above information.  Signed: Karena JINNY Harbour, PT  03/19/2024 1:15 PM

## 2024-03-26 MED FILL — DESCOVY 200 MG-25 MG TABLET: ORAL | 90 days supply | Qty: 90 | Fill #0

## 2024-03-26 MED FILL — CLONAZEPAM 0.5 MG TABLET: ORAL | 30 days supply | Qty: 30 | Fill #1

## 2024-03-26 NOTE — Unmapped (Signed)
 Otolaryngology Clinic Note    Vincent Waters is a 43 y.o. adult is seen in follow up for evaluation of allergic rhinitis s/p septoplasty and inferior turbinate resection on 01/04/2021.    Procedure(s) Performed:   1. Septoplasty (CPT - H431336)   2. Bilateral submucous resection of inferior turbinates (CPT - 30140)  3. Bilateral nasal endoscopy (CPT - [id number])    Operative Findings:   1) Septal deviation Deviated to the LEFT  2) Bilateral Inferior turbinate hypertrophy.       History of Present Illness:     The patient is a 43 y.o. adult who  has a past medical history of Anxiety, Arthritis, Asthma (HHS-HCC), Autoimmune autonomic neuropathy, Breast cyst, Depression, Financial difficulties, Food intolerance (March 2018), Headache, tension-type, Lack of access to transportation, Migraines, Neuromuscular disorder    (CMS-HCC), Rectal bleeding, Restless leg syndrome, Scleroderma    (CMS-HCC), Seizures    (CMS-HCC) (07/27/2014), Sleep apnea treated with continuous positive airway pressure (CPAP), and Visual impairment. who presents with nasal congestion, facial pressure, drainage, and ear fullness/pressure for several years. He is generally prescribed antibiotics and steroids with inconsistent relief of symptoms. His symptoms cause him to snore. He also reports choking on food regularly. He currently uses nasal saline rinses daily. He uses Astelin  PRN.     He reports a previous IT reduction in 2019 with short-term relief of symptoms.     Patients medical records were personally reviewed:      Pt following with [facility]. Endorses constant stuffed nose, and reports having nasal turbinates shaved down previously, which he feels made his symptoms worse. Also endorses persistent bilateral ear discomfort. Says his girlfriend tells him he snores very loudly. Thinks this may be connected to his headaches. On exam today, TMs clear bilaterally, difficult to visualize tonsils as pt has difficulty relaxing or extending tongue during exam, even with tongue depressor. Says that allergist recommended he see ENT. Will place ENT referral, may benefit from sleep study in the future.   - Continue zyrtec  daily + eye drops as needed  - Continue following with [facility]  - ENT referral for persistent nasal congestion + snoring  - Consider sleep study    Update 12/03/2020:   The patient returns today for follow-up. He reports minimal improvement in symptoms from nasal saline rinses and nasal steroid sprays. His congestion is the most bothersome and is not relieved from Afrin or medicated rinses. His symptoms started prior to hormonal therapy. He is on Oxycodone  BID.     Update 01/07/2021:  The patient returns today for his first postoperative follow-up. He is doing well following surgery and reports that he is sleeping better. He is currently rinsing 3x per day and taking antibiotics as prescribed. These cause slight nausea, but nothing overtly bothersome.     Update 01/12/2023:  Vincent Waters is a 43 y.o. adult who returns for follow-up. He reports PND and nasal congestion. As well as facial pain and pressure. She does have a history of OSA but has been unable to tolerate CPAP due to severe reflux with regurgitation that is worse at night. He is scheduled to see GI. She has also had a positive ANA in the past and has been referred back to rheumatology. He has been using flonase  and astelin  daily. She has also notices change in hearing, dizziness and pressure. Has been told that she grinds her teeth, not currently using mouth guard.     Update 07/06/2023:  Vincent Waters is a  43 y.o. adult who last seen in 01/12/2023 returns for 53-months  follow-up. She underwent a hearing study due to concern of tinnitus bilaterally which was unremarkable. She has no nasal discomfort and denies any difficulty breathing through nose. She has no ear discomfort. She has concerns of worsening seasonal allergies.     She has been having dizzy spells. She followed with her PCP and is scheduled to see her Neurologist. She has been having ???thunder headaches??? and also has light-headedness when she stands up. She is weaning herself off from clonazepam  and is taking it as needed for anxiety. She has no tried physical therapy for balance in the past though she is receiving physical therapy for shoulder pain.     Update 03/28/2024:  Vincent Waters is a 43 y.o. adult. Patient is here for a routine 42-month follow-up. He feels well overall. He has been continuing to use Flonase  nasal sprays and saline rinses regularly. He denies any nasal obstruction but does report nasal dryness.    Past Medical History     has a past medical history of Anxiety, Arthritis, Asthma (HHS-HCC), Autoimmune autonomic neuropathy, Breast cyst, Depression, Financial difficulties, Food intolerance (March 2018), Headache, tension-type, Lack of access to transportation, Migraines, Neuromuscular disorder    (CMS-HCC), Rectal bleeding, Restless leg syndrome, Scleroderma    (CMS-HCC), Seizures    (CMS-HCC) (07/27/2014), Sleep apnea treated with continuous positive airway pressure (CPAP), and Visual impairment.    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    Current Medications    Current Outpatient Medications   Medication Sig Dispense Refill    albuterol  2.5 mg /3 mL (0.083 %) nebulizer solution Inhale 3 mL (2.5 mg total) by nebulization every six (6) hours as needed for wheezing or shortness of breath. 90 mL 1    azelastine  (ASTELIN ) 137 mcg (0.1 %) nasal spray Instill 2 sprays into each nostril two (2) times a day as needed for rhinitis. 30 mL 1    bisacodyl  (DULCOLAX) 5 mg EC tablet Take 2 tablets (10 mg total) by mouth Take as directed. Take 2 tablets (10 mg total) as directed for bowel prep. 2 tablet 0    budesonide -formoterol  (SYMBICORT ) 160-4.5 mcg/actuation inhaler Inhale 1 puff by mouth Two (2) times a day. 10.2 g 5    cetirizine  (ZYRTEC ) 10 MG tablet Take 1 tablet (10 mg total) by mouth daily. 30 tablet 12    clonazePAM  (KLONOPIN ) 0.5 MG tablet Take 0.5 tablets (0.25 mg total) by mouth two (2) times a day as needed for anxiety. 30 tablet 5    dextroamphetamine -amphetamine  (ADDERALL) 20 mg tablet Take 1 tablet (20 mg total) by mouth two (2) times a day. 60 tablet 0    [START ON 04/01/2024] dextroamphetamine -amphetamine  (ADDERALL) 20 mg tablet Take 1 tablet (20 mg total) by mouth two (2) times a day. 60 tablet 0    doxycycline  (VIBRA -TABS) 100 MG tablet Take 2 tablets (200 mg total) by  mouth once as needed (ideally within 24 hours, but no later than 72 hours after condomless sex) for up to 10 doses. Take with full glass of water, and remain upright for 30 minutes. 20 tablet 11    emtricitabine -tenofovir  alafen (DESCOVY ) 200-25 mg tablet Take 1 tablet by mouth daily. 90 tablet 3    EPINEPHrine  (EPIPEN ) 0.3 mg/0.3 mL injection Inject 0.3 mL (0.3 mg total) under the skin once for 1 dose. 2 each 0    gabapentin  (NEURONTIN ) 250 mg/5 mL oral solution Take 18 mL (900 mg total) by mouth Three (3) times a day. 1620 mL 2    ibuprofen  (MOTRIN ) 800 MG tablet Take 1 tablet (800 mg total) by mouth every eight (8) hours as needed for pain. 60 tablet 2 inhalational spacing device Spcr Use as directed with albuterol  and symbicort  1 each 1    ipratropium (ATROVENT ) 42 mcg (0.06 %) nasal spray 1-2 sprays in each nostril as needed for drainage, up to 4 time daily. 15 mL 2    methocarbamol  (ROBAXIN ) 500 MG tablet Take 1 tablet (500 mg total) by mouth four (4) times a day. Alternatively, take 2 tablets twice daily. 120 tablet 2    metoclopramide  (REGLAN ) 10 MG tablet Take 1/2 tablet (5 mg total) by mouth Three (3) times a day with a meal. 135 tablet 3    nebulizer and compressor (COMP-AIR NEBULIZER COMPRESSOR) Devi 1 each by Miscellaneous route nightly as needed. 1 each 0    needle, disp, 25 Jayvin (BD REGULAR BEVEL NEEDLES) 25 Hazim x 5/8 Ndle For subcutaneous hormone injection. 25 each 0    omalizumab  (XOLAIR ) 300 mg/2 mL auto-injector Inject the contents of 1 pen (300 mg) under the skin every fourteen (14) days. 4 mL 11    omalizumab  75 mg/0.5 mL AtIn Inject the contents of 1 pen (75 mg) under the skin every fourteen (14) days. 1 mL 11    polyethylene glycol (CLEARLAX) 17 gram/dose powder Take as directed for extended bowel prep. 238 g 0    prucalopride (MOTEGRITY ) 2 mg Tab Take 1 tablet (2 mg total) by mouth daily. 90 tablet 3    safety needles (BD ECLIPSE) 25 Bentley x 1 Ndle use for testosterone  12 each 0    safety needles (BD SAFETYGLIDE NEEDLE) 18 Hendrik x 1 1/2 Ndle For drawing hormone injection 25 each 0    syringe, disposable, (EASY TOUCH LUER LOCK SYRINGE) 1 mL Syrg Use for weekly hormone injection. 4 each 0    syringe, disposable, (EASY TOUCH LUER LOCK SYRINGE) 1 mL Syrg Use for testosterone  12 each 0    syringe, disposable, 2.5 mL Syrg Use weekly 30 Syringe 2    testosterone  cypionate (DEPOTESTOTERONE CYPIONATE) 200 mg/mL injection Inject 0.2 mL (40 mg total) into the muscle once a week. 12 mL 0    triamcinolone  (KENALOG ) 0.1 % cream Apply topically Two (2) times a day. 15 g 0    varicella-zoster gE-AS01B (PF) (SHINGRIX ) injection Inject 0.5 mL into the muscle. 0.5 mL 1    verapamil  (CALAN ) 40 MG tablet Take 1 tablet (40 mg total) by mouth nightly for 14 days, THEN 1 tablet (40 mg total) two (2) times a day for 28 days. 60 tablet 2     Current Facility-Administered Medications   Medication Dose Route Frequency Provider Last Rate Last Admin    omalizumab  (XOLAIR ) injection 300 mg  300 mg Subcutaneous Q28 Days [name], [name], [name]   300 mg at 05/24/22 1138  Allergies    Allergies   Allergen Reactions    Latex Rash and Hives    Penicillins Anaphylaxis     Pt can tolerate amoxicillin    Has patient had a PCN reaction causing immediate rash, facial/tongue/throat swelling, SOB or lightheadedness with hypotension: no   Has patient had a PCN reaction causing severe rash involving mucus membranes or skin necrosis: no  Has patient had a PCN reaction that required hospitalization: unknown  Has patient had a PCN reaction occurring within the last 10 years: no  If all of the above answers are NO, then may proceed with Cephalosporin use.    Shellfish Containing Products Anaphylaxis    Trileptal [Oxcarbazepine] Rash       Family History    Negative for bleeding disorders or free bleeding.     family history includes Breast cancer in his maternal grandmother and sister; Cancer in his mother and sister; Cancer (age of onset: 76) in his maternal uncle; Cataracts in his maternal grandmother; Hypertension in his father; Hypothyroidism in his mother; Immunodeficiency in his mother; Migraines in his mother; Ovarian cancer in his maternal grandmother; Seizures in his mother; Thyroid disease in his mother.    Social History:     reports that he has never smoked. He has never been exposed to tobacco smoke. He has never used smokeless tobacco.   reports that he does not currently use alcohol .   reports that he does not currently use drugs.    Review of Systems    A 12 system review of systems was performed and is negative other than that noted in the history of present illness.      Physical Exam  General: Well-developed, well-nourished. Appropriate, comfortable, and in no apparent distress.  Head/Face: On external examination there is no obvious asymmetry or scars. On palpation there is no tenderness over maxillary sinuses or masses within the salivary glands. Cranial nerves V and VII are intact through all distributions.  Eyes: PERRL, EOMI, the conjunctiva are not injected and sclera is non-icteric.  Ears: On external exam, there is no obvious lesions or asymmetry. The EACs are bilaterally without cerumen or lesions. The TMs are in the neutral position and are mobile to pneumatic otoscopy bilaterally. There are no middle ear masses or fluid noted. Hearing is grossly intact bilaterally.  Nose: On external exam there are neither lesions nor asymmetry of the nasal tip/ dorsum. On anterior rhinoscopy, visualization posteriorly is limited on anterior examination. For this reason, to adequately evaluate posteriorly for masses, polypoid disease and/or signs of infections, nasal endoscopy is indicated (see procedure below).  Oral cavity/oropharynx: The mucosa of the lips, gums, hard and soft palate, posterior pharyngeal wall, tongue, floor of mouth, and buccal region are without masses or lesions and are normally hydrated. Good dentition. Tongue protrudes midline. Tonsils are normal appearing. Supraglottis not visualized due to gag reflex.  Neck: There is no asymmetry or masses. Trachea is midline. There is no enlargement of the thyroid or palpable thyroid nodules.   Lymphatics: There is no palpable lymphadenopathy along the jugulodiagastric, submental, or posterior cervical chains.  Chest: No audible wheeze, unlabored respirations.  Cardiovascular: Regular rate.  GI: Nondistended.  Neurologic: Cranial nerve???s II-XII are grossly intact. Exam is non-focal.  Extremities: No cyanosis, clubbing or edema.    Procedures:  Diagnostic Bilateral Nasal Endoscopy (CPT [phone])    NOTE: Nasal endoscopy is performed for the sinuses only, and not for examination of the skull base, septum or inferior turbinates, nor  is it related to any previously performed septoplasty or inferior turbinate surgery or skull base surgery.     Surgeon: Juliene DOROTHA Rise, MD  Anesthesia: none  Procedure Detail:  As a result of inability to visualize the intranasal anatomy, and after discussion of the potential risks related to the procedure (primarily bleeding), a endoscope is used to examine the left and right sinonasal cavities, including the interior of the nasal cavity and the middle and superior meatus, the turbinates, and the spheno-ethmoid recess. All these areas were inspected.    Findings:    Bilaterally, septum is relatively midline. Turbinates are well reduced/nearly absent. No polyps, purulence or crusting.     Larrie Berber Nasal Endoscopy Score: [employer] is used to assess the degree of inflammation of the sinonasal structures, including the middle and superior turbinates, the ethmoid sinuses, maxillary sinuses, frontal sinuses, and sphenoid sinuses.  In the presence of previous surgery, some or all of these structures may be absent.    Left        Polyps:  Absent (0)   Edema:   Absent (0)   Discharge:  None (0)    Scarring:  Absent (0)   Crusting:  None (0)      Total Left:  0     Right         Polyps:  Absent (0)   Edema:  Absent (0)   Discharge: None (0)    Scarring:  Absent (0)   Crusting:  None (0)      Total Right:   0       Labs and Diagnostic Tests  CT Sinus 12/03/2020:    Normal Audiogram on 07/06/2023.    Assessment:  The patient is a 43 y.o. adult who  has a past medical history of Anxiety, Arthritis, Asthma (HHS-HCC), Autoimmune autonomic neuropathy, Breast cyst, Depression, Financial difficulties, Food intolerance (March 2018), Headache, tension-type, Lack of access to transportation, Migraines, Neuromuscular disorder    (CMS-HCC), Rectal bleeding, Restless leg syndrome, Scleroderma (CMS-HCC), Seizures    (CMS-HCC) (07/27/2014), Sleep apnea treated with continuous positive airway pressure (CPAP), and Visual impairment. who presents for the evaluation of:  Allergic Rhinitis  Septal Deviation  Nasal Airway Obstruction  Headache/Facial Pain  ITH (R)    Recommendations:  Overall patient is doing well post-operatively.   Continue current regimen I.e.,  Flonase  nasal spray (he has been using OTC) and saline rinses. Follow-up in 1 year, or sooner if needed.     The patient voiced complete understanding of plan as detailed above and is in full agreement.    Scribe's Attestation: Juliene DOROTHA Rise, MD obtained and performed the history, physical exam, and medical decision-making elements that were entered into the chart by Lawrnce Krebs, Medical scribe, on March 28, 2024 at 11:24 AM.    ----------------------------------------------------------------------------------------------------------------------  Provider's attestation: March 28, 2024, 11:24 AM. Documentation assistance provided by the Scribe. I was present during the time the encounter was recorded. The information recorded by the Scribe was done at my direction and has been reviewed and validated by me.

## 2024-03-27 DIAGNOSIS — G5702 Lesion of sciatic nerve, left lower limb: Principal | ICD-10-CM

## 2024-03-27 DIAGNOSIS — M25552 Pain in left hip: Principal | ICD-10-CM

## 2024-03-27 DIAGNOSIS — G8929 Other chronic pain: Principal | ICD-10-CM

## 2024-03-27 DIAGNOSIS — M5416 Radiculopathy, lumbar region: Principal | ICD-10-CM

## 2024-03-27 NOTE — Unmapped (Signed)
 [facility]       Patient Name:Vincent Waters  MRN: [id number]  DOB: February 11, 1981  Age: 43 y.o.   Date: 03/27/2024    SUBJECTIVE     Chief complaint: Hip Pain (Patient presents for follow up for left sided hip/butt pain. He reports his pain is intermittent. He still reports that he is having issues with his PT.)      History of present Illness:  Vincent Waters is a 43 y.o. adult with  has a past medical history of Anxiety, Arthritis, Asthma (HHS-HCC), Autoimmune autonomic neuropathy, Breast cyst, Depression, Financial difficulties, Food intolerance (March 2018), Headache, tension-type, Lack of access to transportation, Migraines, Neuromuscular disorder    (CMS-HCC), Rectal bleeding, Restless leg syndrome, Scleroderma    (CMS-HCC), Seizures    (CMS-HCC) (07/27/2014), Sleep apnea treated with continuous positive airway pressure (CPAP), and Visual impairment. who comes in today in with complaint of Hip Pain (Patient presents for follow up for left sided hip/butt pain. He reports his pain is intermittent. He still reports that he is having issues with his PT.)  .    History of Present Illness  Vincent Waters is a 43 year old male with chronic L2, L3 radiculopathy who presents with worsening left hip and leg pain.    He experiences chronic pain primarily in the left hip and leg, which has been persistent and worsening over time. The pain radiates from the hip down the front and back of the leg, reaching the foot. It is exacerbated by sitting and relieved by standing or walking. The pain intensifies when pressure is applied to the affected area, particularly when sitting upright.    He has been undergoing physical therapy, but it has not been effective in alleviating his symptoms. Some exercises have aggravated his condition, especially those affecting the back hamstring. He has stopped certain exercises due to increased pain and irritation.    His current medication regimen includes gabapentin  at a dose of 900mg  tid  which provides inconsistent relief. He has also received dry needling and piriformis injections, which provided temporary relief but were not sustained.    He has a history of visiting the emergency room due to severe pain, where he was given a Toradol  shot and muscle relaxers, which did not provide significant relief. A past EMG study from 2022 was performed which has showed chronic L2-L3 radiculopathy. He has had bilateral hip MRIs. Right Hip MRI showed a labral tear. Left Hip MRI did not show any abnormalities.     He describes a knot in the left hip area, previously identified as a muscle spasm. Injections in the hip area provided temporary relief. The pain is primarily on the left side, with the right side having improved over time.He has had GTB injections and Piriformis injections. No intraarticular hip injections or SI joint injections.     His pain management has been challenging, with previous treatments providing only partial or temporary relief. He is frustrated with the lack of improvement and the impact on his daily life, particularly with activities that involve sitting.        OBJECTIVE   LMP 07/20/2017 (Exact Date)   General/Constitutional: NAD    Comprehensive Left Hip Exam:  Inspection:   Ecchymosis: No  TTP:   left SI joint, piriformis  ROM  Int: 40  Ext: 45  Flexion: 130  Strength:  Abd: 5/5  Flex: 5/5  Special Test:  Log roll: negative  FADIR: negative  FABER: Mildly  positive  Meralgia: negative  Seated Piriformis: Mild pain  Resisted External Derotation: negative  Long Stride Walking Test: negative   Gaenslen's Test Negative   SI compression: Positive      Diagnostic Imaging   Imaging (if performed) was reviewed with the patient.    No new imaging during this encounter.     ASSESSMENT AND PLAN     Pt is a 43 y.o. year old adult with:     Diagnosis ICD-10-CM Associated Orders   1. Chronic left hip pain  M25.552     G89.29       2. Lumbar radiculopathy  M54.16       3. Piriformis syndrome, left  G57.02           Assessment & Plan  Left hip and buttock pain with chronic L2-L3 radiculopathy. Patient is also experiencing Vincent gluteal pain, exacerbated by sitting. Differential includes piriformis syndrome, worsening lumbar back pain along the L5-S1 distribution, SI joint involvement.  Previous treatments have provided temporary relief.  Will follow-up next week and trial a diagnostic/possibly therapeutic SI joint injection.  If symptoms worsen or fail to improve can consider repeating the EMG study.  Will follow-up with the patient on October 15.     - Schedule SI joint injection on October 15th at 3:00 PM.  - Hold off on further physical therapy until after the SI joint injection.  - Monitor symptoms post-injection to assess relief and determine if further EMG study is needed.        Rozelle Bennett, MD, [facility]  [facility]   [employer]  [facility]

## 2024-03-28 DIAGNOSIS — J310 Chronic rhinitis: Principal | ICD-10-CM

## 2024-04-01 MED ORDER — DEXTROAMPHETAMINE-AMPHETAMINE 20 MG TABLET
ORAL_TABLET | Freq: Two times a day (BID) | ORAL | 0 refills | 30.00000 days | Status: CP
Start: 2024-04-01 — End: 2024-05-01
  Filled 2024-04-04: qty 60, 30d supply, fill #0

## 2024-04-02 NOTE — Unmapped (Signed)
 [facility] THERAPY SERVICES AT [facility]  OUTPATIENT PHYSICAL THERAPY  04/02/2024  Note Type: Discharge Note  Progress Reporting Period: 03/06/2024 to 04/02/2024    Patient Name: Vincent Waters  Date of Birth:22-Nov-1980  Diagnosis:   Encounter Diagnoses   Name Primary?    Chronic right-sided low back pain without sciatica Yes    Low back pain radiating to right lower extremity          Referring MD:  Debrah Morene Cadet,*     Date of Onset of Impairment-08/23/2016  Date PT Care Plan Established or Reviewed-04/02/2024  Date PT Treatment Started-08/24/2023   Visit Count: 26  Plan of Care Effective Date:          Assessment/Plan:    Assessment  Assessment details:    I do believe we accomplished what we sent out to accomplish with physical therapy with [facility].  Subjectively, Steffen reports that he is feeling a lot stronger (can now lift up to 65 lb at the gym, can hold his front plank for 120 seconds and side planks bilaterally for 60 seconds, and he can now sit for 1-2 hours without issue, as long as he is using heat).  Objectively, he now has improved posture and excellent bilateral hip strength.  He continues to have pain with activities at various times and the doctors are looking into SI as the pain generator, but I do think we have improved with therapy up to this point. Encouraged him to continue with his current exercises, as long as they don't increase his symptoms, and he agreed to do this.  All questions answered.  Alick is now discharged to his home exercise program. He will reach out if any issues arise in the future.       Impairments: pain, hypertonicity and gait deviation                      Therapy Goals      Goals:      Goals: Updated 04/02/2024.   In 12 weeks:  Goal #1: Patient will be independent with progressively monitored home exercise program.   Baseline/Current: COMPLETE.   Patient is familiar with home exercise program and performs it consistently.     Goal #2: Patient will demonstrate lumbar ROM within normal limits without pain in order to perform daily functional activities such as bending to pick an object off the ground, twisting to reach into the patient's backseat or bend backwards in order to lift an object overhead.   Baseline/Current: COMPLETE.  Patient now has normal lumbar ROM with reports of tightness (see ROM testing).     Goal #3: Patient will demonstrate left hip (on previous notes listed as incorrect side, now corrected 03/06/2024) External Rotation strength 5/5 during manual muscle testing for improved pelvic stability for better walking, squatting and negotiating stairs, reporting pain </= 2/10 with those activities.   Baseline/Current: COMPLETE (met 04/02/2024).   Patient now has excellent bilateral hip strength (including left hip ER strength of 5/5 with manual muscle testing) (see manual muscle testing).      Goal #4: Patient will be able to lift at least 75 lb from floor to waist with good lifting mechanics in order to demonstrate good core and trunk strength so he can lift grocery bags and items around the house with pain </= 2/10.   Baseline/Current: NEARLY COMPLETE (noted 04/02/2024).  Patient reports that he can lift 60-65 lb without a problem as long as  he focuses on his core.    Goal #5: Patient will report improvement in symptoms while sitting and ability to sit up to 1 hour with minimal symptoms.  Baseline/Current: COMPLETE (met 04/02/2024).   Patient reports that he can now sit for 1-2 hours if he uses the seat warmers in his car.    Goal #6: Patient will demonstrate improved posture without cuing for pain </= 2/10.  Baseline/Current: NEARLY COMPLETE (noted 04/02/2024).  Patient is now much more aware of his body position and is doing a much better job controlling his increased lordosis in standing and sitting positions.    Goal #7: Patient will be able to hold a front plank for >/= 2 minutes to demonstrate improved core strength for improved tolerance to sitting and increased stability while walking.  Baseline/Current: COMPLETE (met 04/02/2024).  Patient can now hold a front plank for 120 seconds, reporting that he's feeling much stronger.    Goal #8: Patient will be able to hold a side plank >/= 45 seconds to demonstrate improved core/trunk strength for improved sitting tolerance and increased stability while walking.  Baseline/Current: COMPLETE (met 04/02/2024).  Patient can hold a side plank bilaterally for ~ 60 seconds.    Plan  Therapy options: will not be seen for skilled physical therapy services        Education provided to: patient.    Education provided: Body mechanics, HEP, Treatment options and plan and Symptom management    Education results: verbalized good understanding and demonstrates understanding.    Communication/Consultation: N/A.    Next visit plan:        Patient is discharged to Home Exercise Program.      Total Timed Code Minutes: 35    Treatment rendered today:      Therapeutic Exercise: 30 minutes  Patient Education: Assessment, Plan of Care and Home Exercise Program.    Manual Therapy: 5 minutes  [facility]  was informed of the risks associated with dry needling and he signed consent form.  Patient screened for any contraindications to needling including pregnancy, infection, antibiotic use, anticoagulants use, autoimmune issues, uncontrolled diabetes, and active cancer/treatment and does not have any of these conditions.  DRY NEEDLING FOR RELEASING IDENTIFIED TRIGGER POINTS TO FACILITATE MANUAL THERAPY TECHNIQUES AND ALLOW MUSCLE TO RETURN TO OPTIMAL LENGTH (NO CHARGE).  Right upper trapezius trigger point with 50mm x .30mm with Pinch/Pull Technique established for safety - Local Twitch Response produced    Plan details: Access Code: [email or web address]  URL: [email or web address]  Date: 01/17/2024  Prepared by: Karena Harbour    Exercises  - Supine Posterior Pelvic Tilt  - 1 x daily - 7 x weekly - 1 sets - 20 reps - 5 hold  - Sidelying Thoracolumbar Mobilisation with Towel Roll  - 2 x daily - 7 x weekly - 1 sets - 1 reps - 5 minutes hold  - Clamshell with Resistance (Mirrored)  - 1 x daily - 7 x weekly - 2 sets - 10 reps - 3 hold  - Sidelying Hip Abduction (Mirrored)  - 1 x daily - 7 x weekly - 2 sets - 10 reps  - Hooklying Transversus Abdominis Palpation  - 1 x daily - 7 x weekly - 1 sets - 15 reps - 5 hold  - Supine March  - 1 x daily - 7 x weekly - 2 sets - 10 reps  - Supine 90/90 Alternating Toe Touch  - 1 x daily - 7 x weekly -  2 sets - 10 reps  - Full Plank  - 1 x daily - 7 x weekly - 1 sets - 3 reps - 30-45 hold  - Supine Bridge with Leg Extension  - 1 x daily - 7 x weekly - 2 sets - 10 reps - 5 hold  - Bird Dog  - 1 x daily - 7 x weekly - 2 sets - 10 reps  - Supine Dead Bug with Leg Extension  - 1 x daily - 7 x weekly - 2 sets - 10 reps  - Quadruped Hip Abduction with Resistance Loop  - 1 x daily - 7 x weekly - 2 sets - 10 reps  - Sternocleidomastoid Stretch  - 1 x daily - 7 x weekly - 1 sets - 3 reps - 30 hold  - Supine Piriformis Stretch with Foot on Ground  - 1 x daily - 7 x weekly - 3 sets - 10 reps  - Supine Bilateral Isometric ER With Mobilization Belt  - 1 x daily - 7 x weekly - 15 sets - 10 reps - 5 hold  - Supine Bilateral Isometric Hip Abduction With Mobilization Belt  - 1 x daily - 7 x weekly - 3 sets - 10 reps  - Supine Figure 4 Piriformis Stretch  - 1 x daily - 7 x weekly - 1 sets - 10 reps - 10 hold  - Supine LE Neural Mobilization  - 1 x daily - 7 x weekly - 2 sets - 10 reps  - Supine Gluteal Sets  - 1 x daily - 7 x weekly - 1 sets - 20 reps - 5 hold  - Unilateral Isometric Glute Activation Bent Knee - Left  - 1 x daily - 7 x weekly - 3 sets - 10 reps  - Curl Up with Arms Crossed  - 1 x daily - 7 x weekly - 2 sets - 10 reps - 1 hold  - Side Plank on Elbow  - 1 x daily - 7 x weekly - 1 sets - 3 reps - 10 hold      Subjective:     History of Present Condition     Date of onset:  08/23/2016    Date is approximation?: yes    Explanation:  Patient reports onset of symptoms in 2018.    History of Present Condition/Chief Complaint:  Mr. Hern is a 43 y.o. right hand dominant adult (male to male transition in process) who presents to outpatient physical therapy with chronic low back pressure, tightness and pain that began in 2018.  Patient cannot remember any specific traumatic mechanism of injury that may have been the result of his symptoms, but he does report that he broke his right leg 2x when he was younger.  The first one was when he was a baby.  The second time he broke it falling off the couch when he was 8.  He can't remember if it was his femur or tibia and he hasn't had a recent x-ray.  Subjective:  Mr. Marzette reports that he has been working hard on his front and side planks.  He can now do a front plank for about 2 minutes and side plank on both sides for about 60 seconds. He is feeling a lot stronger in his left leg.  He had his follow-up with the Sports Medicine doctor who recommended that he take a break from physical therapy while he investigates the SI joint.  He has been  able to lift 60-65 lb in the gym, as long as he concentrates on keeping his core tight.  Pain:     Current pain rating:  3    At best pain rating:  3    At worst pain rating:  8  Location:  Left posterior hip radiating down the back of his leg    Quality:  Aching and tightness    Progression:  Improved      Objective:     Static Posture Assessment   Lumbar Spine-Increased lordosis.     Strength  Left Hip   Planes of Motion   Flexion: 5  Extension: 5  Abduction: 5  External rotation: 5  Internal rotation: 5  Right Hip   Planes of Motion   Flexion: 5  Extension: 5  Abduction: 5  External rotation: 5  Internal rotation: 5    Tests     Functional Assessment   Functional Assessment Comments-Front Plank: 120 seconds  Side Plank:  Right: 60 seconds  Left: 60 seconds                                  I attest that I have reviewed the above information.  Signed: Karena JINNY Harbour, PT  04/02/2024 12:55 PM

## 2024-04-03 ENCOUNTER — Encounter
Admit: 2024-04-03 | Discharge: 2024-04-03 | Payer: BLUE CROSS/BLUE SHIELD | Attending: Student in an Organized Health Care Education/Training Program | Primary: Student in an Organized Health Care Education/Training Program

## 2024-04-03 DIAGNOSIS — M79602 Pain in left arm: Principal | ICD-10-CM

## 2024-04-03 DIAGNOSIS — M25552 Pain in left hip: Principal | ICD-10-CM

## 2024-04-03 DIAGNOSIS — J310 Chronic rhinitis: Principal | ICD-10-CM

## 2024-04-03 DIAGNOSIS — N930 Postcoital and contact bleeding: Principal | ICD-10-CM

## 2024-04-03 DIAGNOSIS — R0982 Postnasal drip: Principal | ICD-10-CM

## 2024-04-03 DIAGNOSIS — G8929 Other chronic pain: Principal | ICD-10-CM

## 2024-04-03 DIAGNOSIS — Z79899 Other long term (current) drug therapy: Principal | ICD-10-CM

## 2024-04-03 LAB — CK: CREATINE KINASE TOTAL: 172 U/L — ABNORMAL HIGH (ref 46.0–145.0)

## 2024-04-03 LAB — C-REACTIVE PROTEIN: C-REACTIVE PROTEIN: <5 mg/L (ref <=10.0)

## 2024-04-03 LAB — SEDIMENTATION RATE: ERYTHROCYTE SEDIMENTATION RATE: 5 mm/h (ref 0–15)

## 2024-04-03 MED ADMIN — triamcinolone acetonide (KENALOG-40) injection 20 mg: 20 mg | INTRA_ARTICULAR | @ 20:00:00 | Stop: 2024-04-03

## 2024-04-03 MED ADMIN — ropivacaine (NAROPIN) 5 mg/mL (0.5 %) injection 3 mL: 3 mL | INTRA_ARTICULAR | @ 20:00:00 | Stop: 2024-04-03

## 2024-04-03 MED ADMIN — dexAMETHasone (DECADRON) 4 mg/mL injection 2 mg: 2 mg | INTRA_ARTICULAR | @ 20:00:00 | Stop: 2024-04-03

## 2024-04-03 NOTE — Unmapped (Addendum)
 Switched to Descovy  at 9/25 visit given rash with Apretude . Doing well on Descovy . Low concern for possible connection between medical change and forearm pain that began 1 day later given pain is unilateral and has waxed and waned over the last month.  - Continue Descovy  for PreP treatment  - Plan for q3 month monitoring labs (next due in December 2025).

## 2024-04-03 NOTE — Unmapped (Addendum)
 Coe has been following with ENT for allergic rhinitis and is s/p septoplasty and inferior turbinate resection. Today, complaining of post-nasal drip. Recommend continuing Flonase .  - Flonase 

## 2024-04-03 NOTE — Progress Notes (Signed)
 [facility] Sports Medicine       Patient Name:Vincent Waters  MRN: [id number]  DOB: 1980-12-12  Age: 43 y.o.   Date: 04/03/2024    SUBJECTIVE     Chief complaint: Hip Pain (Patient presents for L hip injection.)      History of present Illness:  Vincent Waters is a 43 y.o. adult with  has a past medical history of Anxiety, Arthritis, Asthma (HHS-HCC), Autoimmune autonomic neuropathy, Breast cyst, Depression, Financial difficulties, Food intolerance (March 2018), Headache, tension-type, Lack of access to transportation, Migraines, Neuromuscular disorder (CMS-HCC), Rectal bleeding, Restless leg syndrome, Scleroderma (CMS-HCC), Seizures    (CMS-HCC) (07/27/2014), Sleep apnea treated with continuous positive airway pressure (CPAP), and Visual impairment. who comes in today in with complaint of Hip Pain (Patient presents for L hip injection.)  .    Patient is presenting for left-sided hip/gluteal/sciatic radicular pain that has been a chronic issue for several years.  Patient has undergone multiple treatments including physical therapy.  There is also been a concern for piriformis syndrome.  He has seen multiple specialist including pain management, physical therapy, his PCP and now has continued follow-up with sports medicine.His pain radiates from his hip down the front and back of the leg and sometimes recent to foot.  The pain is exacerbated by sitting and relieved by standing and walking. The pain intensifies when pressure is applied to the affected area especially with sitting upright to help manage the condition he has been taking gabapentin  900 mg 3 times daily.  He is also completed dry needling.  The previous piriformis injection provided temporary relief.  Other treatments have included a Toradol  shot and muscle relaxants which did not provide significant relief. He had a EMG study from 2022 which showed chronic L2-L3 radiculopathy.  He has also had bilateral hip MRIs.  A right hip MRI showed a labral tear.  Left hip MRI did not show any abnormality.  Other treatments have included greater trochanteric bursa injections.  He has not had a intra-articular hip or SI joint injection.  Today the patient is presenting for a left SI joint injection for diagnostic/therapeutic evaluation      OBJECTIVE   LMP 07/20/2017 (Exact Date)   General/Constitutional: NAD    Comprehensive Back Exam:  -Inspection   Normal  -TTP:   left sacroiliac  -ROM:    Flexion: normal   Extension: normal  -Strength:   HF: 5/5   KE: 5/5   KF: 5/5   DF: 5/5   EHL: 5/5  PF: 5/5  -DTRs  Patellar: 2/4  Achilles: 2//4  - Special tests:   SLR seated: negative   SLR supine: negative   Slump test: negative   FABER: negative   FADIR: negative   Pain with flexion: negative   Pain with extension: negative   SI compression: Positive    SI distraction: Positive    Diagnostic Imaging   Imaging (if performed) was reviewed with the patient.    None    Procedure   US  guided SI injection: Left    Consent   After discussing the various treatment options for the condition, It was agreed that a corticosteroid injection would be the next step in treatment. The nature of and the indications for a corticosteroid and / or local anaesthetic injection were reviewed in detail with the patient today. The inherent risks of injection including infection, bleeding, allergic reaction, increased pain, incomplete relief or temporary relief of symptoms, alterations of blood glucose  levels requiring careful monitoring and treatment as indicated, tendon, ligament or articular cartilage rupture or degeneration, nerve injury, skin depigmentation, and/or fatty atrophy were discussed.   Procedure   After the risks and benefits of the procedure were explained,verbal consent was given, and a procedural time-out was performed. The hip joint and surrounding structures were visualized with ultrasound and the SI joint was identified  The site for the injection was properly marked and prepped with Chlorhexidine solution.  The injection site was anesthetized with ethyl chloride and 4cc of 1% Lidocaine . Using ultrasound guidance, the SI joint was visualized and injected with 20 milligrams of Kenalog , 2 milligrams of Dexamethasone , 3 cc of .5% Ropivacaine  using a sterile technique and a 22 Mansel 3.5 inch needle. During the injection, there was unrestricted flow and care was taken not to inject corticosteroid into the skin or subcutaneous tissues. There were no complications during the procedure.    Post procedure a sterile band-aide was applied. Post-injection instructions were given regarding post-procedure care, when to follow up in clinic and what to expect from the procedure. The patient tolerated the injection well and was discharged without complication.    NEED FOR SONOGRAPHIC GUIDANCE    Given the patient's body habitus, complexity of this problem, or anatomic location of this structure, sonographic guidance is recommended to prevent injury to neurovascular structures and confirm accuracy of injection. The accuracy of doing these injections blind is poor and the benefit to the patient by using ultrasound guidance is significant to avoid complications.  Furthermore, this patient has failed conservative treatment with physical therapy and modalities and the diagnostic and therapeutic accuracy is important.    Reference:  American Medical Society for Sports Medicine (AMSSM) position statement: interventional musculoskeletal ultrasound in sports medicine.  Garnetta ALEC Hurst MM, Adams E, Berkoff D, Concoff AL, [place] LELON Claudene DOROTHA Mallissa J Sports Med. 2014 Oct 20. pii: [id number]. doi: [email or web address]        ASSESSMENT AND PLAN     Pt is a 43 y.o. year old adult with:     Diagnosis ICD-10-CM Associated Orders   1. Chronic left hip pain  M25.552 dexAMETHasone  (DECADRON ) 4 mg/mL injection 2 mg    G89.29 ropivacaine  (NAROPIN ) 5 mg/mL (0.5 %) injection 3 mL     triamcinolone  acetonide (KENALOG -40) injection 20 mg      2. Pain of left sacroiliac joint  M53.3 dexAMETHasone  (DECADRON ) 4 mg/mL injection 2 mg     ropivacaine  (NAROPIN ) 5 mg/mL (0.5 %) injection 3 mL     triamcinolone  acetonide (KENALOG -40) injection 20 mg          I had a good discussion with the patient.  He has low back pain caused radicular symptoms.  There has been concern for piriformis syndrome, L2/L3 radiculopathy and possible SI joint arthralgia.  With shared decision making today we elected proceed with a SI joint injection.  Detailed procedure note above. Patient tolerated the procedure without difficulty.  Advised patient to follow-up in 4 to 6 weeks, to reassess symptoms.  Return/emergent precautions were discussed.  All questions were answered and patient reasonable treatment going forward.    Home exercise program: no  PT referral: no  OTC meds: no  Prescription medication: no  Brace/Orthotic: no  Injection: yes  Imaging: no  Labs: no  Specialty referral: no  OA supplement handout: no    Rozelle Bennett, MD, [facility]  [facility]   [employer]   [facility]

## 2024-04-03 NOTE — Progress Notes (Signed)
 Procedural Timeout    Correct patient?: yes    Correct procedure and correct site?: yes    Safety Precautions: Allergies reviewed?: yes    All relevant documents and studies (history and physical, laboratory, pathology, radiology, etc.) available?: yes    Consent completed?: yes    Required items for procedure (implants, devices, special equipment) available: yes    Expiration dates checked?: yes    Site marked, if applicable?:yes

## 2024-04-03 NOTE — Unmapped (Signed)
 [facility] at [facility]  Established Patient Clinic Note    Assessment/Plan:   [name] is a 43 y.o.adult who presents for follow-up.    Problem List Items Addressed This Visit       Chronic rhinitis    On pre-exposure prophylaxis for HIV    Post-nasal drip     Other Visit Diagnoses         Left arm pain    -  Primary    Relevant Orders    Sedimentation Rate    C-reactive protein    CK      PCB (post coital bleeding)        Relevant Orders    Chlamydia/Gonorrhoeae NAA    Vaginitis Molecular Panel          - Current meds: Current Medications[1]  Assessment & Plan  Left arm pain  Vincent Waters endorses pain in his L proximal forearm and elbow that started 3 weeks ago and worsened to the point that he visited the ED for workup. States that this pain was new, throbbing, and not improved with home pain regimen including gabapentin , robaxin , and ibuprofen . Experienced some relief with 1 dose of oxycodone  from ED and referred for outpatient workup. Pt denies any new exercises, weight training, or repetitive motion (tennis, manual labor, etc.) prior to the onset of this pain. Pt drives frequently (working as Patent attorney) and states that using his arm to drive worsens the pain. Per the patient, elbow pain resolved spontaneously after 1 week (by Sept 31) but returned last night. Diffusely tender to palpation of L elbow, most notably to anterolateral surface on the brachioradialis muscle belly. Strength equal in bilateral upper extremities. Denies paresthesias or numbness to hand or fingers. 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 Differential diagnosis at this time includes lateral epicondylitis, brachioradialis tendonitis or spasm, myositis (autoimmune vs viral, given pt also complaining of recent cold), drug-induced myositis (from 9/25 initiation of Descovy ), with not-miss diagnoses of fracture or bone mass. Will initiate broad initial workup.  - Recommended using heat and light massage at home for pain relief  - Placing additional PT referral to work on forearm muscles  - L upper extremity X-Ray  - Labs today: CRP, ESR, CK; if abnormal, consider following up with rheumatology for evaluation of myositis    Orders:    Sedimentation Rate; Future    C-reactive protein; Future    CK; Future    XR Elbow 2 views, Left; Future    PCB (post coital bleeding)  Vincent Waters is s/p total hysterectomy at [facility] 08/2017. Patient has not had a pelvic exam or visited a gynecologist since the time of surgery in 2019. Today he reports that he has vaginal bleeding after sex, most recently as a few months ago. Describes bleeding as a gush and like I'm breaking my hymen again. He denies any bleeding when not having sex. Also denies vaginal discharge, though endorses a long history of BV, yeast infections, and hx of STIs (most recently treated for gonorrhea in 10/2023, STI panel negative on 01/2024 and patient has not been sexually active since then). Pt endorses insertional vaginal pain but denies other vaginal or vulvar discomfort, pain, itching outside of sexual activity. On speculum exam, a small area of yellowed tissue was visualized on the L lateral aspect of the vaginal cuff. Photo  documented in media tab. No vaginal discharge was appreciated. Differential includes vaginal atrophy in a transgender man on testosterone , local infection at vaginal cuff, irritated scar/granulation tissue, vaginal mass, or vaginal cuff dehiscence (less likely given absence of discharge or bleeding when not having sex).  - Collected Chlamydia/Gonorrhea and vaginitis swabs  today  - If negative, will consider referral to [facility] for evaluation of post-hysterectomy vaginal bleeding    Orders:    Chlamydia/Gonorrhoeae NAA    Vaginitis Molecular Panel    Ambulatory referral to Obstetrics / Gynecology; Future    Chronic rhinitis  Post-nasal drip  Vincent Waters has been following with ENT for allergic rhinitis and is s/p septoplasty and inferior turbinate resection. Today, complaining of post-nasal drip. Recommend continuing Flonase .  - Flonase        On pre-exposure prophylaxis for HIV  Switched to Descovy  at 9/25 visit given rash with Apretude . Doing well on Descovy . Low concern for possible connection between medical change and forearm pain that began 1 day later given pain is unilateral and has waxed and waned over the last month.  - Continue Descovy  for PreP treatment  - Plan for q3 month monitoring labs (next due in December 2025).         HEALTH MAINTENANCE ITEMS STILL DUE:  Health Maintenance Due   Topic Date Due    Influenza Vaccine (1) 02/19/2024     Follow-up: No follow-ups on file.    Future Appointments   Date Time Provider Department Center   04/03/2024  3:00 PM Celena Rozelle BROCKS, MD [facility]   04/09/2024  8:20 AM Caro Concha Bruckner, MD [facility]   04/10/2024  1:30 PM Melvenia Child [id number] [facility]   04/19/2024  9:45 AM Reside, Marcey PARAS, DMD UNCDFPOMLSUG [facility]   04/25/2024  9:30 AM Arne Lavanda Bang, PMHNP OPTCVilcom [facility]   05/02/2024  9:30 AM Lobonc, Prentice Cadet, MD ANESPAINMRKT [facility]   05/03/2024 12:30 PM Sonna Gee, MD UNCGIMEDET [facility]   06/03/2024  8:00 AM Caid, Comer PARAS, MD MELVERN [facility]   06/06/2024  9:30 AM Sebastian Pierce, FNP ANESPAINMRKT [facility]   06/21/2024 12:35 PM Argentina Geofm CROME, FNP UNCHNEUMM [facility]       Subjective   Vincent Waters is a 43 y.o. adult  coming to clinic today for follow-up of L arm pain and post-coital vaginal bleeding.    Chief Complaint   Patient presents with    Follow-up     HPI: see above for more information.    I have reviewed the problem list, medications, and allergies and have updated/reconciled them if needed.    Doc  reports that he has never smoked. He has never been exposed to tobacco smoke. He has never used smokeless tobacco.  Health Maintenance   Topic Date Due    Influenza Vaccine (1) 02/19/2024    DTaP/Tdap/Td Vaccines (2 - Td or Tdap) 08/19/2027    Lipid Screening  11/01/2028    Pneumococcal Vaccine 0-49  Completed    Hepatitis C Screen  Completed    COVID-19 Vaccine  Completed       Objective     VITALS: BP 127/83 (BP Site: L Arm, BP Position: Sitting, BP Cuff Size: Large)  - Pulse 73  - Ht 162.6 cm (5' 4)  - LMP 07/20/2017 (Exact Date)  - BMI 26.78 kg/m??     Physical Exam  General: well-appearing, sitting upright in no acute distress  Head: Normocephalic, atraumatic  ENT:  No dental trauma noted. No rhinorrhea.  Eyes: conjunctiva normal, non-erythematous, non-icteric, no discharge.  Neck: no thyroid enlargement or masses. Few <1cm lymph nodes palpated in R anterior chain.  Lungs: No increased work of breathing or audible wheezing  Skin: Warm, dry, no erythema or rash on exposed skin  GU: small area of yellowed tissue was visualized on the L lateral aspect of the vaginal cuff. Photo documented in media tab. No vaginal discharge was appreciated.  Musculoskeletal: No visible gait abnormalities. Strength 5/5 bilaterally in upper extremities. Tenderness to palpation of L forearm, worse on anterolateral surface (brachioradialis). Pulses intact throughout, including after elevation of arms.  Neurologic: Alert & oriented x 3, no gross sensorimotor abnormalities  Psychiatric: Pleasant, cooperative, good eye contact, appropriate thought processes    Wt Readings from Last 3 Encounters:   03/16/24 70.8 kg (156 lb)   03/15/24 70.8 kg (156 lb)   03/14/24 71.1 kg (156 lb 12.8 oz)      PHQ-9 PHQ-9 Total Score   10/22/2020   9:00 AM 6    09/04/2020   9:00 AM 3    08/21/2020   9:00 AM 5    05/22/2020   3:00 PM 9        Data saved with a previous flowsheet row definition       LABS/IMAGING: I have reviewed pertinent recent labs and imaging in Epic.    Note initiated by Ronnald Felix  MS4, [employer]    I attest that I have reviewed the student note, and that the components of the history of the present illness, the physical exam, and the assessment and plan documented were performed by me or were performed in my presence by the student where I verified the documentation and performed (or re-performed) the exam and medical decision making.     I personally spent a total of 40 minutes taking care of this patient including time with the patient, pre-visit planning, documentation, reviewing labs, coordinating care, and reviewing documentation from other providers in [facility] and in other specialties.  This was all done on the day of the visit.  My (and this clinic's) longitudinal relationship with this patient was instrumental in caring for this patient today including caring for their chronic medical and psychiatric conditions.      Odis Aho, MD, MPH (he/him)  [facility] at [facility]  [address]   [place], [place] [address]  Phone: [phone]  Fax: [phone]        [1]   Current Outpatient Medications:     albuterol  2.5 mg /3 mL (0.083 %) nebulizer solution, Inhale 3 mL (2.5 mg total) by nebulization every six (6) hours as needed for wheezing or shortness of breath., Disp: 90 mL, Rfl: 1    azelastine  (ASTELIN ) 137 mcg (0.1 %) nasal spray, Instill 2 sprays into each nostril two (2) times a day as needed for rhinitis., Disp: 30 mL, Rfl: 1    bisacodyl  (DULCOLAX) 5 mg EC tablet, Take 2 tablets (10 mg total) by mouth Take as directed. Take 2 tablets (10 mg total) as directed for bowel prep., Disp: 2 tablet, Rfl: 0    budesonide -formoterol  (SYMBICORT ) 160-4.5 mcg/actuation inhaler, Inhale 1 puff by mouth Two (2) times a day., Disp: 10.2 g, Rfl: 5    cetirizine  (ZYRTEC ) 10 MG tablet, Take 1 tablet (10 mg total) by mouth daily., Disp: 30 tablet, Rfl: 12    clonazePAM  (KLONOPIN ) 0.5 MG tablet, Take 0.5 tablets (  0.25 mg total) by mouth two (2) times a day as needed for anxiety., Disp: 30 tablet, Rfl: 5 dextroamphetamine -amphetamine  (ADDERALL) 20 mg tablet, Take 1 tablet (20 mg total) by mouth two (2) times a day., Disp: 60 tablet, Rfl: 0    doxycycline  (VIBRA -TABS) 100 MG tablet, Take 2 tablets (200 mg total) by mouth once as needed (ideally within 24 hours, but no later than 72 hours after condomless sex) for up to 10 doses. Take with full glass of water, and remain upright for 30 minutes., Disp: 20 tablet, Rfl: 11    emtricitabine -tenofovir  alafen (DESCOVY ) 200-25 mg tablet, Take 1 tablet by mouth daily., Disp: 90 tablet, Rfl: 3    EPINEPHrine  (EPIPEN ) 0.3 mg/0.3 mL injection, Inject 0.3 mL (0.3 mg total) under the skin once for 1 dose., Disp: 2 each, Rfl: 0    gabapentin  (NEURONTIN ) 250 mg/5 mL oral solution, Take 18 mL (900 mg total) by mouth Three (3) times a day., Disp: 1620 mL, Rfl: 2    inhalational spacing device Spcr, Use as directed with albuterol  and symbicort , Disp: 1 each, Rfl: 1    ipratropium (ATROVENT ) 42 mcg (0.06 %) nasal spray, 1-2 sprays in each nostril as needed for drainage, up to 4 time daily., Disp: 15 mL, Rfl: 2    methocarbamol  (ROBAXIN ) 500 MG tablet, Take 1 tablet (500 mg total) by mouth four (4) times a day. Alternatively, take 2 tablets twice daily., Disp: 120 tablet, Rfl: 2    metoclopramide  (REGLAN ) 10 MG tablet, Take 1/2 tablet (5 mg total) by mouth Three (3) times a day with a meal., Disp: 135 tablet, Rfl: 3    nebulizer and compressor (COMP-AIR NEBULIZER COMPRESSOR) Devi, 1 each by Miscellaneous route nightly as needed., Disp: 1 each, Rfl: 0    needle, disp, 25 Tollie (BD REGULAR BEVEL NEEDLES) 25 Miking x 5/8 Ndle, For subcutaneous hormone injection., Disp: 25 each, Rfl: 0    omalizumab  (XOLAIR ) 300 mg/2 mL auto-injector, Inject the contents of 1 pen (300 mg) under the skin every fourteen (14) days., Disp: 4 mL, Rfl: 11    omalizumab  75 mg/0.5 mL AtIn, Inject the contents of 1 pen (75 mg) under the skin every fourteen (14) days., Disp: 1 mL, Rfl: 11    pantoprazole  (PROTONIX ) 40 MG tablet, Take 1 tablet (40 mg total) by mouth daily., Disp: 90 tablet, Rfl: 3    polyethylene glycol (CLEARLAX) 17 gram/dose powder, Take as directed for extended bowel prep., Disp: 238 g, Rfl: 0    prochlorperazine (COMPAZINE) 10 MG tablet, Take 1 tablet (10 mg total) by mouth two (2) times a day as needed for nausea for up to 7 days., Disp: 10 tablet, Rfl: 0    prucalopride (MOTEGRITY ) 2 mg Tab, Take 1 tablet (2 mg total) by mouth daily., Disp: 90 tablet, Rfl: 3    safety needles (BD ECLIPSE) 25 Jatin x 1 Ndle, use for testosterone , Disp: 12 each, Rfl: 0    safety needles (BD SAFETYGLIDE NEEDLE) 18 Alexios x 1 1/2 Ndle, For drawing hormone injection, Disp: 25 each, Rfl: 0    syringe, disposable, (EASY TOUCH LUER LOCK SYRINGE) 1 mL Syrg, Use for weekly hormone injection., Disp: 4 each, Rfl: 0    syringe, disposable, (EASY TOUCH LUER LOCK SYRINGE) 1 mL Syrg, Use for testosterone , Disp: 12 each, Rfl: 0    syringe, disposable, 2.5 mL Syrg, Use weekly, Disp: 30 Syringe, Rfl: 2    testosterone  cypionate (DEPOTESTOTERONE CYPIONATE) 200 mg/mL injection, Inject 0.2  mL (40 mg total) into the muscle once a week., Disp: 12 mL, Rfl: 0    triamcinolone  (KENALOG ) 0.1 % cream, Apply topically Two (2) times a day., Disp: 15 g, Rfl: 0    varicella-zoster gE-AS01B (PF) (SHINGRIX ) injection, Inject 0.5 mL into the muscle., Disp: 0.5 mL, Rfl: 1    verapamil  (CALAN ) 40 MG tablet, Take 1 tablet (40 mg total) by mouth nightly for 14 days, THEN 1 tablet (40 mg total) two (2) times a day for 28 days., Disp: 60 tablet, Rfl: 2    dextroamphetamine -amphetamine  (ADDERALL) 20 mg tablet, Take 1 tablet (20 mg total) by mouth two (2) times a day. (Patient not taking: Reported on 04/03/2024), Disp: 60 tablet, Rfl: 0    ibuprofen  (MOTRIN ) 800 MG tablet, Take 1 tablet (800 mg total) by mouth every eight (8) hours as needed for pain. (Patient not taking: Reported on 04/03/2024), Disp: 60 tablet, Rfl: 2    Current Facility-Administered Medications: omalizumab  (XOLAIR ) injection 300 mg, 300 mg, Subcutaneous, Q28 Days, [name], [name], [name], 300 mg at 05/24/22 1138

## 2024-04-03 NOTE — Unmapped (Addendum)
 Vincent Waters has been following with ENT for allergic rhinitis and is s/p septoplasty and inferior turbinate resection. Today, complaining of post-nasal drip. Recommend continuing Flonase .  - Flonase 

## 2024-04-08 NOTE — Unmapped (Signed)
 Left a voicemail to confirm cardiology appt tomorrow at 8:20 am with Dr. Caro.

## 2024-04-08 NOTE — Unmapped (Signed)
 Hi Vincent Waters,    It was great to see you today. I look forward to seeing you at your next visit. In the meantime, please feel free to message me on mychart with any non-time-sensitive questions or requests. For any time-sensitive matters (including completion of medical clearance, school/work physical or other forms), please call [phone] to request a phone, video, or in-person visit with me or one of my colleagues.     Additionally, [facility] has an Urgent Care at our [facility] location! Call [phone] for same-day appointments, available Monday-Friday 7am-8pm; Saturday and Sunday 12pm-5pm. If you think you are having an emergency, please call 911 or go to your nearest emergency department.     If you ever need urgent help with your mental health, including for any thoughts of self-harm or suicide, please call the [place] mental health crisis line at [phone], or the national suicide prevention hotline at 713-525-3688.     Take care,    Valdene Garret, MD, MPH (he/him)  [facility] at [facility]  [address]   [place], [place] [address]  Phone: [phone]  Fax: [phone]

## 2024-04-11 NOTE — Unmapped (Signed)
 [facility] has made a second and final attempt to reach this patient to refill the following medication:Xolair .      We have left voicemails on the following phone numbers: [phone], have sent a text message to the following phone numbers: [phone], and have sent a Mychart questionnaire..    Dates contacted: 04/02/24, 04/11/24  Last scheduled delivery: 03/13/24    The patient may be at risk of non-compliance with this medication. The patient should call the [facility] at [phone]  Option 4, then Option 3: Allergy, Immunology, Pulmonary, Neurology to refill medication.    Vincent Waters   [facility] Specialty Technician

## 2024-04-11 NOTE — Unmapped (Signed)
 [facility] Refill Coordination Note    Vincent Waters, [place]: 08-17-1980  Phone: [phone] (home)       All above HIPAA information was verified with patient.         04/11/2024    10:39 AM   Specialty Rx Medication Refill Questionnaire   Which Medications would you like refilled and shipped? Xola   Please list all current allergies: Pen   Have you missed any doses in the last 30 days? No   Have you had any changes to your medication(s) since your last refill? No   How much of each medication do you have remaining at home? (eg. number of tablets, injections, etc.) 0   If receiving an injectable medication, next injection date is 04/16/2024   Have you experienced any side effects in the last 30 days? No   Please enter the full address (street address, city, state, zip code) where you would like your medication(s) to be delivered to. [address] [place] [address]   Please specify on which day you would like your medication(s) to arrive. Note: if you need your medication(s) within 3 days, please call the pharmacy to schedule your order at [phone]  04/16/2024   Has your insurance changed since your last refill? No   Would you like a pharmacist to call you to discuss your medication(s)? No   Do you require a signature for your package? (Note: if we are billing Medicare Part B or your order contains a controlled substance, we will require a signature) No   I have been provided my out of pocket cost for my medication and approve the pharmacy to charge the amount to my credit card on file. Yes         Completed refill call assessment today to schedule patient's medication shipment from the [facility] [phone]).  All relevant notes have been reviewed.       Confirmed patient received a Conservation officer, historic buildings and a Surveyor, mining with first shipment. The patient will receive a drug information handout for each medication shipped and additional FDA Medication Guides as required.         REFERRAL TO PHARMACIST     Referral to the pharmacist: Not needed      [facility]     Shipping address confirmed in Epic.     Delivery Scheduled: Yes, Expected medication delivery date: 04/16/24.     Medication will be delivered via Same Day Courier to the prescription address in Epic WAM.    Kyra Myron   [facility] Specialty Technician

## 2024-04-16 DIAGNOSIS — J329 Chronic sinusitis, unspecified: Principal | ICD-10-CM

## 2024-04-16 DIAGNOSIS — M79602 Pain in left arm: Principal | ICD-10-CM

## 2024-04-16 MED ORDER — DOXYCYCLINE HYCLATE 100 MG TABLET
ORAL_TABLET | Freq: Two times a day (BID) | ORAL | 0 refills | 10.00000 days | Status: CP
Start: 2024-04-16 — End: 2024-04-26
  Filled 2024-04-17: qty 20, 10d supply, fill #0

## 2024-04-16 MED FILL — XOLAIR 300 MG/2 ML SUBCUTANEOUS AUTO-INJECTOR: SUBCUTANEOUS | 28 days supply | Qty: 4 | Fill #3

## 2024-04-16 MED FILL — XOLAIR 75 MG/0.5 ML SUBCUTANEOUS AUTO-INJECTOR: SUBCUTANEOUS | 28 days supply | Qty: 1 | Fill #3

## 2024-04-16 NOTE — Progress Notes (Signed)
 [facility] at [facility]  Established Patient Clinic Note    Assessment/Plan:   Vincent Waters is a 43 y.o.adult who presents for follow-up.    Problem List Items Addressed This Visit       Sinusitis - Primary    Relevant Medications    doxycycline  (VIBRA -TABS) 100 MG tablet     Other Visit Diagnoses         Left arm pain              - Current meds: Current Medications[1]  Assessment & Plan  # Acute sinusitis  Acute bacterial rhinosinusitis suspected due to congestion, sore throat, ear pain, and headache with direct frontal sinus TTP. Significant nasal congestion noted. No fever or chills. Bilateral otoscopic exam with bulging of TM but no purulence to suggest AOM. After discussion of risks and benefits, pt would like to proceed with five-day course of doxy (allergic to penicillin). Will plan to follow up in one week if symptoms not improved by then, or sooner as needed. Return precautions provided, pt verbalizes understanding and has no further questions or concerns at this time.   - doxycycline  (VIBRA -TABS) 100 MG tablet; Take 1 tablet (100 mg total) by mouth two (2) times a day for 10 days.  Dispense: 20 tablet; Refill: 0  - RTC in one week if not improved, or sooner as needed     # Left arm pain with possible lateral epicondylitis and carpal tunnel syndrome  Pain in forearm radiating from elbow to hand, with numbness and tingling in fingers. Phalen's negative but Tinnel's positive for numbness, tingling and pain on left side. Differential includes lateral epicondylitis and carpal tunnel syndrome. Symptoms may relate to driving posture. CK levels slightly elevated but this has been elevated previously without these symptoms, so low suspicion that this is related. Pt agrees with following up with sports med to discuss possible carpal tunnel injection. Return precautions provided, pt verbalizes understanding and has no further questions or concerns at this time.   - F/u with Sports Medicine for carpal tunnel syndrome evaluation.    HEALTH MAINTENANCE ITEMS STILL DUE:  Health Maintenance Due   Topic Date Due    COVID-19 Vaccine (8 - 2025-26 season) 02/19/2024    Influenza Vaccine (1) 02/19/2024     Follow-up: No follow-ups on file.    Future Appointments   Date Time Provider Department Center   04/19/2024  8:00 AM Celena Rozelle BROCKS, MD [facility]   04/19/2024  9:45 AM Reside, Marcey PARAS, DMD UNCDFPOMLSUG [facility]   04/24/2024  1:00 PM Celena Rozelle BROCKS, MD [facility]   04/25/2024  9:30 AM Arne Lavanda Bang, PMHNP [facility]   04/29/2024  8:30 AM Stem, Dorn Sharper, MD SURGI1UMH [facility]   05/02/2024  9:30 AM Christell, Prentice Cadet, MD ANESPAINMRKT [facility]   05/03/2024 12:30 PM Sonna Gee, MD UNCGIMEDET [facility]   05/07/2024  1:30 PM Melvenia Child [id number] [facility]   05/14/2024 10:20 AM Caro Concha Bruckner, MD CARDSTHPOINT [facility]   06/03/2024  8:00 AM Caid, Comer PARAS, MD MELVERN [facility]   06/06/2024  9:30 AM Sebastian Pierce, FNP ANESPAINMRKT [facility]   06/21/2024 12:35 PM Argentina Geofm CROME, FNP UNCHNEUMM [facility]       Subjective   Vincent Waters is a 43 y.o. adult  coming to clinic today for follow-up.    Chief Complaint   Patient presents with    Ear pain  For past week     Pain with Swallowing     For past week     History of Present Illness  Vincent Waters is a 43 year old male who presents for follow-up.     He began experiencing nasal congestion and right ear pain over the past weekend, initially with a sore throat and drainage in the back of his throat, which progressed to ear pain and a headache. The headache originates at the temples and radiates to the ears. No fever, but he feels congested and stuffy.     Additionally, he reports left forearm pain that escalates from the elbow to the hand, with associated numbness and tingling in the fingers. The pain is exacerbated by certain movements and is described as 'hurting like hell'. He works as a Patent attorney and notes that his driving posture may contribute to his symptoms.     I have reviewed the problem list, medications, and allergies and have updated/reconciled them if needed.    Vincent Waters  reports that he has never smoked. He has never been exposed to tobacco smoke. He has never used smokeless tobacco.  Health Maintenance   Topic Date Due    COVID-19 Vaccine (8 - 2025-26 season) 02/19/2024    Influenza Vaccine (1) 02/19/2024    DTaP/Tdap/Td Vaccines (2 - Td or Tdap) 08/19/2027    Lipid Screening  11/01/2028    Pneumococcal Vaccine 0-49  Completed    Hepatitis C Screen  Completed       Objective     VITALS: BP 119/91 (BP Site: L Arm, BP Position: Sitting, BP Cuff Size: Medium)  - Pulse 76  - Resp 18  - Ht 162.6 cm (5' 4)  - LMP 07/20/2017 (Exact Date)  - BMI 26.78 kg/m??     Physical Exam  General: well-appearing, sitting upright in no acute distress  Head: Normocephalic, atraumatic  ENT: No dental trauma noted. Moderate TTP over frontal sinuses bilaterally. Erythematous, edematous nasal mucosa with thick discharge. Bilateral otoscopic exam with bulging of TM's.  Eyes: conjunctiva normal, non-erythematous, non-icteric, no discharge.  Neck: no thyroid enlargement or masses  Lungs: No increased work of breathing or audible wheezing  Ext: Tinnel's positive on left side; Phalen's negative. Reproduction of L elbow / proximal forearm pain with direct palpation and wrist flexion but not extension.   Skin: Warm, dry, no erythema or rash on exposed skin  Musculoskeletal: No visible gait abnormalities  Neurologic: Alert & oriented x 3, no gross sensorimotor abnormalities  Psychiatric: Pleasant, cooperative, good eye contact, appropriate thought processes    Wt Readings from Last 3 Encounters:   03/16/24 70.8 kg (156 lb)   03/15/24 70.8 kg (156 lb)   03/14/24 71.1 kg (156 lb 12.8 oz)      PHQ-9 PHQ-9 Total Score   10/22/2020   9:00 AM 6    09/04/2020   9:00 AM 3 08/21/2020   9:00 AM 5    05/22/2020   3:00 PM 9        Data saved with a previous flowsheet row definition     LABS/IMAGING: I have reviewed pertinent recent labs and imaging in Epic.    Odis Aho, MD, MPH (he/him)  [facility]  [address]   [place], [place] [address]  Phone: [phone]  Fax: [phone]       [1]   Current Outpatient Medications:     albuterol  2.5 mg /3 mL (0.083 %) nebulizer  solution, Inhale 3 mL (2.5 mg total) by nebulization every six (6) hours as needed for wheezing or shortness of breath., Disp: 90 mL, Rfl: 1    azelastine  (ASTELIN ) 137 mcg (0.1 %) nasal spray, Instill 2 sprays into each nostril two (2) times a day as needed for rhinitis., Disp: 30 mL, Rfl: 1    bisacodyl  (DULCOLAX) 5 mg EC tablet, Take 2 tablets (10 mg total) by mouth Take as directed. Take 2 tablets (10 mg total) as directed for bowel prep., Disp: 2 tablet, Rfl: 0    budesonide -formoterol  (SYMBICORT ) 160-4.5 mcg/actuation inhaler, Inhale 1 puff by mouth Two (2) times a day., Disp: 10.2 g, Rfl: 5    cetirizine  (ZYRTEC ) 10 MG tablet, Take 1 tablet (10 mg total) by mouth daily., Disp: 30 tablet, Rfl: 12    clonazePAM  (KLONOPIN ) 0.5 MG tablet, Take 0.5 tablets (0.25 mg total) by mouth two (2) times a day as needed for anxiety., Disp: 30 tablet, Rfl: 5    dextroamphetamine -amphetamine  (ADDERALL) 20 mg tablet, Take 1 tablet (20 mg total) by mouth two (2) times a day., Disp: 60 tablet, Rfl: 0    doxycycline  (VIBRA -TABS) 100 MG tablet, Take 2 tablets (200 mg total) by mouth once as needed (ideally within 24 hours, but no later than 72 hours after condomless sex) for up to 10 doses. Take with full glass of water, and remain upright for 30 minutes., Disp: 20 tablet, Rfl: 11    emtricitabine -tenofovir  alafen (DESCOVY ) 200-25 mg tablet, Take 1 tablet by mouth daily., Disp: 90 tablet, Rfl: 3    EPINEPHrine  (EPIPEN ) 0.3 mg/0.3 mL injection, Inject 0.3 mL (0.3 mg total) under the skin once for 1 dose., Disp: 2 each, Rfl: 0    gabapentin  (NEURONTIN ) 250 mg/5 mL oral solution, Take 18 mL (900 mg total) by mouth Three (3) times a day., Disp: 1620 mL, Rfl: 2    inhalational spacing device Spcr, Use as directed with albuterol  and symbicort , Disp: 1 each, Rfl: 1    ipratropium (ATROVENT ) 42 mcg (0.06 %) nasal spray, 1-2 sprays in each nostril as needed for drainage, up to 4 time daily., Disp: 15 mL, Rfl: 2    methocarbamol  (ROBAXIN ) 500 MG tablet, Take 1 tablet (500 mg total) by mouth four (4) times a day. Alternatively, take 2 tablets twice daily., Disp: 120 tablet, Rfl: 2    metoclopramide  (REGLAN ) 10 MG tablet, Take 1/2 tablet (5 mg total) by mouth Three (3) times a day with a meal., Disp: 135 tablet, Rfl: 3    nebulizer and compressor (COMP-AIR NEBULIZER COMPRESSOR) Devi, 1 each by Miscellaneous route nightly as needed., Disp: 1 each, Rfl: 0    needle, disp, 25 Akai (BD REGULAR BEVEL NEEDLES) 25 Virlan x 5/8 Ndle, For subcutaneous hormone injection., Disp: 25 each, Rfl: 0    omalizumab  (XOLAIR ) 300 mg/2 mL auto-injector, Inject the contents of 1 pen (300 mg) under the skin every fourteen (14) days., Disp: 4 mL, Rfl: 11    omalizumab  75 mg/0.5 mL AtIn, Inject the contents of 1 pen (75 mg) under the skin every fourteen (14) days., Disp: 1 mL, Rfl: 11    pantoprazole  (PROTONIX ) 40 MG tablet, Take 1 tablet (40 mg total) by mouth daily., Disp: 90 tablet, Rfl: 3    polyethylene glycol (CLEARLAX) 17 gram/dose powder, Take as directed for extended bowel prep., Disp: 238 g, Rfl: 0    prochlorperazine (COMPAZINE) 10 MG tablet, Take 1 tablet (10 mg total) by mouth two (2) times  a day as needed for nausea for up to 7 days., Disp: 10 tablet, Rfl: 0    prucalopride (MOTEGRITY ) 2 mg Tab, Take 1 tablet (2 mg total) by mouth daily., Disp: 90 tablet, Rfl: 3    safety needles (BD ECLIPSE) 25 Deacon x 1 Ndle, use for testosterone , Disp: 12 each, Rfl: 0    safety needles (BD SAFETYGLIDE NEEDLE) 18 Sterlin x 1 1/2 Ndle, For drawing hormone injection, Disp: 25 each, Rfl: 0    syringe, disposable, (EASY TOUCH LUER LOCK SYRINGE) 1 mL Syrg, Use for weekly hormone injection., Disp: 4 each, Rfl: 0    syringe, disposable, (EASY TOUCH LUER LOCK SYRINGE) 1 mL Syrg, Use for testosterone , Disp: 12 each, Rfl: 0    syringe, disposable, 2.5 mL Syrg, Use weekly, Disp: 30 Syringe, Rfl: 2    testosterone  cypionate (DEPOTESTOTERONE CYPIONATE) 200 mg/mL injection, Inject 0.2 mL (40 mg total) into the muscle once a week., Disp: 12 mL, Rfl: 0    triamcinolone  (KENALOG ) 0.1 % cream, Apply topically Two (2) times a day., Disp: 15 g, Rfl: 0    varicella-zoster gE-AS01B (PF) (SHINGRIX ) injection, Inject 0.5 mL into the muscle., Disp: 0.5 mL, Rfl: 1    verapamil  (CALAN ) 40 MG tablet, Take 1 tablet (40 mg total) by mouth nightly for 14 days, THEN 1 tablet (40 mg total) two (2) times a day for 28 days., Disp: 60 tablet, Rfl: 2    dextroamphetamine -amphetamine  (ADDERALL) 20 mg tablet, Take 1 tablet (20 mg total) by mouth two (2) times a day. (Patient not taking: Reported on 04/16/2024), Disp: 60 tablet, Rfl: 0    doxycycline  (VIBRA -TABS) 100 MG tablet, Take 1 tablet (100 mg total) by mouth two (2) times a day for 10 days., Disp: 20 tablet, Rfl: 0    ibuprofen  (MOTRIN ) 800 MG tablet, Take 1 tablet (800 mg total) by mouth every eight (8) hours as needed for pain. (Patient not taking: Reported on 04/16/2024), Disp: 60 tablet, Rfl: 2    Current Facility-Administered Medications:     omalizumab  (XOLAIR ) injection 300 mg, 300 mg, Subcutaneous, Q28 Days, [name], [name], [name], 300 mg at 05/24/22 1138

## 2024-04-16 NOTE — Patient Instructions (Signed)
 Hi Vincent Waters,    It was great to see you today. I look forward to seeing you at your next visit. In the meantime, please feel free to message me on mychart with any non-time-sensitive questions or requests. For any time-sensitive matters (including completion of medical clearance, school/work physical or other forms), please call [phone] to request a phone, video, or in-person visit with me or one of my colleagues.     Additionally, [facility] has an Urgent Care at our [facility] location! Call [phone] for same-day appointments, available Monday-Friday 7am-8pm; Saturday and Sunday 12pm-5pm. If you think you are having an emergency, please call 911 or go to your nearest emergency department.     If you ever need urgent help with your mental health, including for any thoughts of self-harm or suicide, please call the [place] mental health crisis line at [phone], or the national suicide prevention hotline at 713-525-3688.     Take care,    Valdene Garret, MD, MPH (he/him)  [facility] at [facility]  [address]   [place], [place] [address]  Phone: [phone]  Fax: [phone]

## 2024-04-17 MED FILL — GABAPENTIN 250 MG/5 ML ORAL SOLUTION: ORAL | 26 days supply | Fill #1

## 2024-04-22 NOTE — Progress Notes (Signed)
 [facility]  Psychiatry   Established Patient E&M Service - Outpatient                                                                   [facility] - [place]    Name: Vincent Waters  Date: 04/25/2024  MRN: [id number]  DOB: 08-Oct-1980  PCP: Debrah Morene Cadet, MD    Assessment:    [facility] presents for follow-up evaluation. Pt is pleasant and cooperative, pt is dealing with multiple medical issues and worsening anxiety (from med issues and current federal gov't shutdown). Pt is not sleeping well d/t anxiety, will inc Klonopin  to try and abate these issues. No safety concerns, follow up in 1 month.       Identifying Information:  Vincent Waters is a 43 y.o. adult with a history of GAD, Panic disorder, ADHD. Pt has long hx of anxiety, ADHD (since age 57), former dx of Bipolar disorder which has been unfounded.     Risk Assessment:  An assessment of suicide and violence risk factors was performed as part of this evaluation and is not significantly increased from the last visit.   While future psychiatric events cannot be accurately predicted, the patient does not currently require acute inpatient psychiatric care and does not currently meet [place]  involuntary commitment criteria.      Plan:    Problem: GAD/panic disorder  Status of problem: chronic and stable  Interventions:   -- INCREASE Klonopin  0.5mg  PO BID and add extra 0.5mg  PO at bedtime for insomnia, for anxiety/panic/insomnia, inc to 0.5mg  PO BID and 0.5mg  if cannot fall asleep after 1 hour after HS dose, inc 04/25/24    -- pt has long hx of being on multiple past meds, did not respond well to SSRI's      Problem: ADHD  Status of problem: worsening  Interventions:   --continue Adderall 20mg  PO BID, restarted 08/17/23, inc 11/09/23 (pt prefers immediate release over XR formulation). Pt was dx as child, started on Ritalin age 43 and in supportive classes at school.   -- does not like long acting stimulants, upset stomach      Psychotherapy provided:  No billable psychotherapy service provided.      There is no height or weight on file to calculate BMI.    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 Debrah     Labs:  Not indicated    Guardian:  self    Therapy:  [  ] wants therapist  Sees Dr. Debrah every 2 weeks - able to talk to him about anything  [ X] [place] therapy referral made 04/25/24    Refills sent: klonopin , adderall    Scheduled follow up visit:  05/23/24 at 0930 for 30 min video visit with abby Jamyiah Labella    Patient has been given information on how to contact this clinician for concerns. The patient has been instructed to call 911 for emergencies.      Subjective:    Interval History:   Met with pt via video, pt is pleasant and cooperative    One thing after another  Seeing sports med for arm - ? Tennis elbow  Had first visit just the other day    Try PT for hip and back issues - a specialist for back    Had injection in STI joint and not helpful yet    PTSD sx flaring up  Gabapentin  helpful  Anxiety is higher - with gov't issues, gets paranoid and isolating, avoiding people  Protection mechanism  Doesn't do well with change  Family stressors  Political stressors    Hyperfocused on pain and medical issues    Trouble sleeping - stay up for 2 days and then crash and sleep all day  Then ok for several days, then repeats    Hypersensitive to textures    When drive - feel the tires rubbing agains pavement and it bothers him    Running around Dr. 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    Will nap in spurts    Do not feel adderall is contributing to issues    Discussed importance of sleep hygiene  Getting on regular schedule for sleep        Objective:    Mental Status Exam:  Appearance:    Appears stated age, Well nourished, Well developed, and Clean/Neat   Motor:   No abnormal movements   Speech/Language:    Normal rate, volume, tone, fluency   Mood:   Not great   Affect:   Calm, Cooperative, and Euthymic   Thought process and Associations:   Logical, linear, clear, coherent, goal directed   Abnormal/psychotic thought content:     Denies SI, HI, self harm, delusions, obsessions, paranoid ideation, or ideas of reference   Perceptual disturbances:     Denies auditory and visual hallucinations, behavior not concerning for response to internal stimuli     Other:          Visit was completed by video (or phone) and the appropriate disclaimer has been included below.      The patient reports they are physically located in [place]  and is currently: at home. I conducted a audio/video visit. I spent  66m 32s on the video call with the patient. I spent an additional 17 minutes on pre- and  post-visit activities on the date of service .   I personally spent 45 minutes face-to-face and non-face-to-face in the care of this patient, which includes all pre, intra, and post visit time on the date of service.  All documented time was specific to the E/M visit and does not include any procedures that may have been performed.          Lavanda GORMAN Shape, PMHNP  April 25, 2024 12:47 PM     [facility] - virtual clinic  [address], [address]  [place], [place] [address]    Office [phone]

## 2024-04-24 NOTE — Patient Instructions (Addendum)
 Thank you for coming to [facility] Sports Medicine clinic today.     We aim to provide you with the highest quality care.  If you have any unanswered questions after the visit or need to schedule a follow up visit, please do not hesitate to call us:     [facility] Medicine/Sports Medicine [place]:  [phone]     [facility] Medicine/Sports Medicine [place]:     [phone]     [facility] [place]   [phone]     You can also use MyChart to reach out to [place].     We look forward to seeing you again in the future and appreciate you coming to [facility].     Thank you.    Michaelene Song, MD   [facility]  [facility] Sports Medicine   [facility]

## 2024-04-24 NOTE — Patient Instructions (Signed)
 POST PROCEDURE INSTRUCTIONS   You may apply an ice pack 20-30 minutes at a time to the injection site if you experience soreness.     Keep the injection site clean and dry. You make remove the band-aid one day following the procedure.     You may take a shower but AVOID getting in to baths, pools or whirlpools for 48 HOURS AFTER THE PROCEDURE.       ACTIVITY   Refrain from heavy activity for the next 24 to 48 hours. General walking is okay. You may resume your normal activities the day following the procedure.   You may start or resume your individualized exercise program or physical therapy 48 hours after the procedure.  IF YOU'VE HAD TRIGGER POINT INJECTIONS, YOU MAY CONTINUE EXERCISE OR PHYSICAL THERAPY WITHOUT DELAY.  MEDICATIONS   Please note that it is okay to continue other prescribed medications (blood pressure, insulin, water pill, depression/anxiety pill, etc.) as well as other prescribed pain medications such as Neurontin, Lyrical, Celebrex, Ultram, Vicodin, Norco and acetaminophen (Tylenol).   SIDE EFFECTS   Increase in pain during the first 24 to 48 hours.     You might experience:   1. Mild to moderate swelling at the joint.   2. Possible bruising at the injection site.     WHEN TO CALL THE DOCTOR/NURSE   Severe pain, worse or different that the pain you had before the procedure.     Fever or chills.     Redness, or swelling around the injection site.     Call the Pain Management  Procedural nurses [phone],  during normal business hours (7 am-3 pm). If it is AFTER HOURS or during a weekend or holiday, call the hospital operator and ask for the Anesthesia Pain physician on call at [phone].   FOR EMERGENCIES, CALL 911 OR GO TO THE NEAREST HOSPITAL EMERGENCY DEPARTMENT.   ?   TO SCHEDULE APPOINTMENTS OR FOR QUESTIONS RELATED TO MEDICATIONS   Call the Pain Management Clinic at [phone]

## 2024-04-24 NOTE — Progress Notes (Signed)
 [facility]       Patient Name:Vincent Waters  MRN: [id number]  DOB: February 04, 1981  Age: 43 y.o.   Date: 04/24/2024    SUBJECTIVE     Chief complaint: Hip Pain (Follow up for hip pain.) and Arm Pain (Patient presents with left arm pain, numbness and tingling. He reports that it starts at his elbow and goes down to his hand. Dr. Debrah told him to come see Dr. Modene Andy. He does not remember any injury. Pain is constant.)      History of present Illness:  Vincent Waters is a 43 y.o. adult with  has a past medical history of Anxiety, Arthritis, Asthma (HHS-HCC), Autoimmune autonomic neuropathy, Breast cyst, Depression, Financial difficulties, Food intolerance (March 2018), Headache, tension-type, Lack of access to transportation, Migraines, Neuromuscular disorder (CMS-HCC), OSA (obstructive sleep apnea), Rectal bleeding, Restless leg syndrome, Scleroderma (CMS-HCC), Seizures    (CMS-HCC) (07/27/2014), Sleep apnea treated with continuous positive airway pressure (CPAP), and Visual impairment. who comes in today in with complaint of Hip Pain (Follow up for hip pain.) and Arm Pain (Patient presents with left arm pain, numbness and tingling. He reports that it starts at his elbow and goes down to his hand. Dr. Debrah told him to come see Dr. Trino Higinbotham. He does not remember any injury. Pain is constant.)  .    History of Present Illness  Vincent Waters is a 43 year old male who presents with persistent hip and arm pain.    He experiences persistent low back, SI joint, and gluteal pain along the sciatic nerve distributive patter.  Pain has slightly improved since his previous SI joint injection back on 04/24/2024. The pain is intermittent and exacerbated by sitting, especially on firm surfaces. Relief is achieved by lying on his stomach and applying heat. He has developed a knot in his back, which he associates with increased pain and spasms. The pain radiates from the hip to the lower back and down to the gluteal region.    He also reports a new onset of arm pain that began with a dull ache in the elbow. He was treated with medications including muscle relaxers. The pain is described as a 'ball' that forms in the elbow, with occasional tingling and numbness. The pain is exacerbated by certain movements and positions, such as extending the arm or typing.Pain is along the lateral epicondylar area.    He works as a hospital doctor, which involves prolonged sitting, and he has reduced his work hours due to the pain. He is right-hand dominant and uses his right hand primarily for driving. No current tingling in the hands, but it comes and goes.      OBJECTIVE   LMP 07/20/2017 (Exact Date)   General/Constitutional: NAD    Comprehensive Back Exam:  -Inspection   Normal  -TTP:   Mild Tenderness left SI joint  -ROM:    Flexion: normal   Extension: normal  -Strength:   HF: 5/5   KE: 5/5   KF: 5/5   DF: 5/5   EHL: 5/5  PF: 5/5  -DTRs  Patellar: 2/4  Achilles: 2//4  - Special tests:   SLR seated: negative   SLR supine: negative   Slump test: negative   FABER: positive   FADIR: negative   Pain with flexion: negative   Pain with extension: negative  Comprehensive Left Elbow Exam:    -Inspection:   Joint effusion: No   Olecranon bursa swelling: No  -TTP:  lateral epicondylar area  -ROM:    Flexion: Normal   Extension: Normal   Pronation: Normal   Supination: Normal  -Strength:   Wrist extension: 5/5   Wrist flexion: 5/5   Bicep: 5/5   Tricep: 5/5  -Special Tests:   Cubital Tinel's: Negative  Valgus Stress Test: Not done   Moving Valgus Stress Test: Not done   Milking Maneuver: Not done      Diagnostic Imaging   Imaging (if performed) was reviewed with the patient.    None    ASSESSMENT AND PLAN     Pt is a 43 y.o. year old adult with:     Diagnosis ICD-10-CM Associated Orders   1. Left elbow pain  M25.522       2. Chronic left hip pain  M25.552     G89.29       3. Pain of left sacroiliac joint  M53.3       4. Lumbar radiculopathy  M54.16 5. Piriformis syndrome, left  G57.02           Assessment & Plan  Left lateral epicondylitis (tennis elbow)  Consistent with lateral epicondylitis, likely due to strain on the extensor carpi radialis brevis tendon. No radiculopathy.  - Provided exercises for lateral epicondylitis.  - Recommended purchasing a brace for support.    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  Chronic low back pain with left gluteal pain and muscle spasm. Previous injections provided limited relief. Considering alternative treatments.  - Referred to Norleen Ang, a skilled physical therapist, for specialized treatment focusing on posture and subtle imbalances.  - Provided contact information for Norleen Ang.      Rozelle Bennett, MD, [facility]  [facility]   [employer]  [facility]

## 2024-04-25 DIAGNOSIS — F411 Generalized anxiety disorder: Principal | ICD-10-CM

## 2024-04-25 DIAGNOSIS — F410 Panic disorder [episodic paroxysmal anxiety] without agoraphobia: Principal | ICD-10-CM

## 2024-04-25 DIAGNOSIS — F902 Attention-deficit hyperactivity disorder, combined type: Principal | ICD-10-CM

## 2024-04-25 MED ORDER — CLONAZEPAM 0.5 MG TABLET
ORAL_TABLET | Freq: Three times a day (TID) | ORAL | 5 refills | 30.00000 days | Status: CP | PRN
Start: 2024-04-25 — End: 2024-10-22
  Filled 2024-05-02: qty 90, 30d supply, fill #0

## 2024-04-25 MED ORDER — DEXTROAMPHETAMINE-AMPHETAMINE 20 MG TABLET
ORAL_TABLET | Freq: Two times a day (BID) | ORAL | 0 refills | 30.00000 days | Status: CP
Start: 2024-04-25 — End: 2024-05-25
  Filled 2024-05-06: qty 60, 30d supply, fill #0

## 2024-04-25 NOTE — Patient Instructions (Signed)
 Follow-up instructions:  -- Please continue taking your medications as prescribed for your mental health.   -- Do not make changes to your medications, including taking more or less than prescribed, unless under the supervision of your physician. Be aware that some medications may make you feel worse if abruptly stopped.  -- Please refrain from using illicit substances, as these can affect your mood and could cause anxiety or other concerning symptoms.   -- Seek further medical care for any increase in symptoms or new symptoms such as thoughts of wanting to hurt yourself or hurt others.     Contact info:  Life-threatening emergencies: call 911 or go to the nearest ER for medical or psychiatric attention. [facility] line [phone]    Urgent questions, Non-urgent routine concerns, questions, and refill requests: please call the clinic at [phone].      [facility]  [address], [address]  [place], [place] [address]    Office [phone]         Regarding appointments:  - If you need to cancel your appointment, we ask that you call  [phone]  at least 24 hours before your scheduled appointment.  - If for any reason you arrive 15 minutes later than your scheduled appointment time, you may not be seen and your visit may be rescheduled.  - Please remember that we will not automatically reschedule missed appointments.  - If you miss two (2) appointments without letting us  know in advance, you will likely be referred to a provider in your community.  - We will do our best to be on time. Sometimes an emergency will arise that might cause your clinician to be late. We will try to inform you of this when you check in for your appointment. If you wait more than 15 minutes past your appointment time without such notice, please speak with the front desk staff.    In the event of bad weather, the clinic staff will attempt to contact you, should your appointment need to be rescheduled. Additionally, you can call the Patient Weather Line [phone] for system-wide clinic status     For more information and reminders regarding clinic policies (these were provided when you were admitted to the clinic), please ask the front desk.      Current Outpatient Medications:     albuterol  2.5 mg /3 mL (0.083 %) nebulizer solution, Inhale 3 mL (2.5 mg total) by nebulization every six (6) hours as needed for wheezing or shortness of breath., Disp: 90 mL, Rfl: 1    azelastine  (ASTELIN ) 137 mcg (0.1 %) nasal spray, Instill 2 sprays into each nostril two (2) times a day as needed for rhinitis., Disp: 30 mL, Rfl: 1    bisacodyl  (DULCOLAX) 5 mg EC tablet, Take 2 tablets (10 mg total) by mouth Take as directed. Take 2 tablets (10 mg total) as directed for bowel prep., Disp: 2 tablet, Rfl: 0    budesonide -formoterol  (SYMBICORT ) 160-4.5 mcg/actuation inhaler, Inhale 1 puff by mouth Two (2) times a day., Disp: 10.2 g, Rfl: 5    cetirizine  (ZYRTEC ) 10 MG tablet, Take 1 tablet (10 mg total) by mouth daily., Disp: 30 tablet, Rfl: 12    clonazePAM  (KLONOPIN ) 0.5 MG tablet, Take 1 tablet (0.5 mg total) by mouth Three (3) times a day as needed for anxiety or sleep., Disp: 90 tablet, Rfl: 5    dextroamphetamine -amphetamine  (ADDERALL) 20 mg tablet, Take 1 tablet (20 mg total) by mouth two (  2) times a day., Disp: 60 tablet, Rfl: 0    [START ON 05/25/2024] dextroamphetamine -amphetamine  (ADDERALL) 20 mg tablet, Take 1 tablet (20 mg total) by mouth two (2) times a day., Disp: 60 tablet, Rfl: 0    [START ON 06/24/2024] dextroamphetamine -amphetamine  (ADDERALL) 20 mg tablet, Take 1 tablet (20 mg total) by mouth two (2) times a day., Disp: 60 tablet, Rfl: 0    doxycycline  (VIBRA -TABS) 100 MG tablet, Take 2 tablets (200 mg total) by mouth once as needed (ideally within 24 hours, but no later than 72 hours after condomless sex) for up to 10 doses. Take with full glass of water, and remain upright for 30 minutes., Disp: 20 tablet, Rfl: 11 doxycycline  (VIBRA -TABS) 100 MG tablet, Take 1 tablet (100 mg total) by mouth two (2) times a day for 10 days., Disp: 20 tablet, Rfl: 0    emtricitabine -tenofovir  alafen (DESCOVY ) 200-25 mg tablet, Take 1 tablet by mouth daily., Disp: 90 tablet, Rfl: 3    EPINEPHrine  (EPIPEN ) 0.3 mg/0.3 mL injection, Inject 0.3 mL (0.3 mg total) under the skin once for 1 dose., Disp: 2 each, Rfl: 0    gabapentin  (NEURONTIN ) 250 mg/5 mL oral solution, Take 18 mL (900 mg total) by mouth Three (3) times a day., Disp: 1620 mL, Rfl: 2    ibuprofen  (MOTRIN ) 800 MG tablet, Take 1 tablet (800 mg total) by mouth every eight (8) hours as needed for pain. (Patient not taking: Reported on 04/16/2024), Disp: 60 tablet, Rfl: 2    inhalational spacing device Spcr, Use as directed with albuterol  and symbicort , Disp: 1 each, Rfl: 1    ipratropium (ATROVENT ) 42 mcg (0.06 %) nasal spray, 1-2 sprays in each nostril as needed for drainage, up to 4 time daily., Disp: 15 mL, Rfl: 2    methocarbamol  (ROBAXIN ) 500 MG tablet, Take 1 tablet (500 mg total) by mouth four (4) times a day. Alternatively, take 2 tablets twice daily., Disp: 120 tablet, Rfl: 2    metoclopramide  (REGLAN ) 10 MG tablet, Take 1/2 tablet (5 mg total) by mouth Three (3) times a day with a meal., Disp: 135 tablet, Rfl: 3    nebulizer and compressor (COMP-AIR NEBULIZER COMPRESSOR) Devi, 1 each by Miscellaneous route nightly as needed., Disp: 1 each, Rfl: 0    needle, disp, 25 Parag (BD REGULAR BEVEL NEEDLES) 25 Kule x 5/8 Ndle, For subcutaneous hormone injection., Disp: 25 each, Rfl: 0    omalizumab  (XOLAIR ) 300 mg/2 mL auto-injector, Inject the contents of 1 pen (300 mg) under the skin every fourteen (14) days., Disp: 4 mL, Rfl: 11    omalizumab  75 mg/0.5 mL AtIn, Inject the contents of 1 pen (75 mg) under the skin every fourteen (14) days., Disp: 1 mL, Rfl: 11    polyethylene glycol (CLEARLAX) 17 gram/dose powder, Take as directed for extended bowel prep., Disp: 238 g, Rfl: 0 prucalopride (MOTEGRITY ) 2 mg Tab, Take 1 tablet (2 mg total) by mouth daily., Disp: 90 tablet, Rfl: 3    safety needles (BD ECLIPSE) 25 Nishanth x 1 Ndle, use for testosterone , Disp: 12 each, Rfl: 0    safety needles (BD SAFETYGLIDE NEEDLE) 18 Alam x 1 1/2 Ndle, For drawing hormone injection, Disp: 25 each, Rfl: 0    syringe, disposable, (EASY TOUCH LUER LOCK SYRINGE) 1 mL Syrg, Use for weekly hormone injection., Disp: 4 each, Rfl: 0    syringe, disposable, (EASY TOUCH LUER LOCK SYRINGE) 1 mL Syrg, Use for testosterone , Disp: 12 each, Rfl: 0  syringe, disposable, 2.5 mL Syrg, Use weekly, Disp: 30 Syringe, Rfl: 2    testosterone  cypionate (DEPOTESTOTERONE CYPIONATE) 200 mg/mL injection, Inject 0.2 mL (40 mg total) into the muscle once a week., Disp: 12 mL, Rfl: 0    triamcinolone  (KENALOG ) 0.1 % cream, Apply topically Two (2) times a day., Disp: 15 g, Rfl: 0    varicella-zoster gE-AS01B (PF) (SHINGRIX ) injection, Inject 0.5 mL into the muscle., Disp: 0.5 mL, Rfl: 1    verapamil  (CALAN ) 40 MG tablet, Take 1 tablet (40 mg total) by mouth nightly for 14 days, THEN 1 tablet (40 mg total) two (2) times a day for 28 days., Disp: 60 tablet, Rfl: 2    Current Facility-Administered Medications:     omalizumab  (XOLAIR ) injection 300 mg, 300 mg, Subcutaneous, Q28 Days, [name], [name], [name], 300 mg at 05/24/22 1138      Urgent Crisis Resources    [facility] Crisis Line  Call: [phone]    [phone] - National Suicide Hotline    [facility]    [facility]  [phone]    24/7 mobile crisis response for behavioral health crises    [employer] [facility] Crisis [facility]  [phone]    24 hour co-response team - for domestic violence, people in mental health crisis, traumatic situations, needing safety planning, victims of crime, stalking/harassment, outreach to vulnerable people    [facility]  [address]  [place], [place] [address]  [phone]  24/7 crisis facility - mental health assessment, treatment and stabilization    [facility]  [phone]    [facility])  [address], [place],    [place] [address]  Ph: [phone].    [facility]        [facility]) , 24/7  [address] [place] [address]      Ph: [phone]    [facility]  [phone] (24 hours a day, 7 days a week)    [facility] Crisis Line   [phone] (24 hours a day, 7 days a week)    National Suicide and Crisis Lifeline   988 (24 hours a day, 7 days a week)

## 2024-04-28 NOTE — Progress Notes (Unsigned)
 Colorectal Surgery Clinic Note    Today's Date: 04/29/2024    Subjective  Vincent Waters - [facility] is a 43 y.o. adult who I had seen prior for history of anal fissure.  He represents today fro.    Objective  LMP 07/20/2017      Gen: {exam:62499::alert,well appearing,in no acute distress}  CV: {exam:62501::regular rate,normotensive}  Pulm: {exam:62503::breathing comfortably on room air}  Abd: {exam:68184::soft,nondistended,nontender to palpation}. {Incisions/wounds:68185} {Ostomy:62506} {ostomy function:77281}. {Drains:72395}  Ext: {exam:62508::warm and well perfused bilaterally,no edema}      Plan        Dorn CHRISTELLA Bock, MD, MD   Colorectal Surgery

## 2024-05-02 ENCOUNTER — Ambulatory Visit
Admit: 2024-05-02 | Discharge: 2024-05-03 | Payer: BLUE CROSS/BLUE SHIELD | Attending: Anesthesiology | Primary: Anesthesiology

## 2024-05-02 DIAGNOSIS — G540 Brachial plexus disorders: Principal | ICD-10-CM

## 2024-05-02 DIAGNOSIS — T7800XS Anaphylactic reaction due to unspecified food, sequela: Principal | ICD-10-CM

## 2024-05-02 DIAGNOSIS — M7918 Myalgia, other site: Principal | ICD-10-CM

## 2024-05-02 DIAGNOSIS — M791 Myalgia, unspecified site: Principal | ICD-10-CM

## 2024-05-02 MED ORDER — EPINEPHRINE 0.3 MG/0.3 ML INJECTION, AUTO-INJECTOR
Freq: Once | SUBCUTANEOUS | 0 refills | 2.00000 days | Status: CP
Start: 2024-05-02 — End: 2024-05-04
  Filled 2024-05-06: qty 2, 2d supply, fill #0

## 2024-05-02 MED ADMIN — bupivacaine HCl (MARCAINE) 0.25 % (2.5 mg/mL) injection 75 mg: 30 mL | @ 15:00:00 | Stop: 2024-05-02

## 2024-05-02 NOTE — Progress Notes (Signed)
 AVS reviewed with patient and sent through MyChart. Voiced understanding.

## 2024-05-02 NOTE — Progress Notes (Addendum)
 Trigger Point Injection of 3+ muscle groups: Right cervical paraspinal, trapezius, levator scapulae, rhomboids.   Pre-operative diagnosis: myofascial pain  Post-operative diagnosis: Same    Attending Physician: Dr. Prentice Cost  Assistant: none    Repeat TPI must document:      - Pt with focal area of pain in skeletal muscle: Right trap, cervical paraspinal, levator, rhomboids  - Exam reveals hyperirritable spot and taut band identified by palpation   -Area of pain has restricted range of motion: Yes.  - Pt has failed non-invasive conservative therapy and has limited movement of affected area  - Pt involved in ongoing conservative treatment program including HEP  -Possible surgical treatment? No  - Pt with previous TPI on 07/27/23 with good pain relief  - Pt noted functional improvement after last TPI of >50%  - Now with recurrence of myofascial pain resulting functional limitations  - Dates of previous TPIs in 12 month rolling period:  07/27/23     **Functional Mobility Assessment Scale (FMAS) Patient Questionnaire**  1.Walking on a flat surface: 0-No difficulty  2.Walking up and down stairs:0-No difficulty  3.Sitting down and standing up from a chair:0-No difficulty  4.Getting in and out of bed:0-No difficulty  5.Bending down to pick up an object:0-No difficulty  6.Reaching overhead to retrieve an item:1-Mild Difficulty  7.Carrying a light object (e.g., a bag of groceries):1-Mild Difficulty  8.Performing household chores (e.g., vacuuming, dusting):0-No difficulty  9.Driving or riding in a rjm:9-Wn difficulty  10.Performing personal care activities (e.g., bathing, dressing):2-Moderate Difficulty     Additional Comments: None    Total Score: 4     Interpretation: 0-10: Minimal impact on function      After risks and benefits were explained including bleeding, infection, worsening of the pain, damage to the area being injected, weakness, allergic reaction to medications, vascular injection, pneumothorax and nerve damage, signed consent was obtained.  All questions were answered.   The patient is not taking antiplatelet or anticoagulation medications and does not have a driver today.    The area of the trigger point was identified and the skin prepped with chlorhexidine.  Next, a 27 Shyam 0.5 inch needle was placed in the area of the trigger point.  Dry needling was performed in the area of the trigger point.  Once reproduction of the pain was elicited and negative aspiration confirmed, the trigger point was injected with 1-2 ml of 0.25 % of bupivacaine  and the needle removed.      The patient did tolerate the procedure well and there were not complications.      Trigger points injected: 8    Trigger point locations:  right cervical paraspinal, trapezius, levator scapulae, rhomboids.     The patient was monitored for 15 minutes after the procedure.  Vital signs remained normal and the patient ambulated out of clinic    Pre-procedure pain score: 7/10  Post-procedure pain score: 2/10    DISPO:  Repeat TPI PRN.

## 2024-05-03 ENCOUNTER — Ambulatory Visit: Admit: 2024-05-03 | Discharge: 2024-05-04 | Payer: BLUE CROSS/BLUE SHIELD

## 2024-05-03 DIAGNOSIS — K3184 Gastroparesis: Principal | ICD-10-CM

## 2024-05-03 DIAGNOSIS — R12 Heartburn: Principal | ICD-10-CM

## 2024-05-03 MED ORDER — PANTOPRAZOLE 40 MG TABLET,DELAYED RELEASE
ORAL_TABLET | Freq: Every day | ORAL | 3 refills | 90.00000 days | Status: CP
Start: 2024-05-03 — End: 2025-05-03
  Filled 2024-05-28: qty 90, 90d supply, fill #0

## 2024-05-03 MED ORDER — PRUCALOPRIDE 2 MG TABLET
ORAL_TABLET | Freq: Every day | ORAL | 3 refills | 90.00000 days | Status: CP
Start: 2024-05-03 — End: ?
  Filled 2024-05-14: qty 90, 90d supply, fill #0

## 2024-05-03 MED ORDER — METOCLOPRAMIDE 10 MG TABLET
ORAL_TABLET | Freq: Two times a day (BID) | ORAL | 3 refills | 60.00000 days | Status: CP | PRN
Start: 2024-05-03 — End: ?
  Filled 2024-05-28: qty 120, 60d supply, fill #0

## 2024-05-03 NOTE — Progress Notes (Addendum)
 Vincent Waters, 43 y.o. adult  Referring Provider: Ames Waters   Primary Care Provider: Debrah Morene Cadet, MD    CC: Follow-up of gastroparesis and constipation    *This note was written in part using Dragon dictation software and thus may contain transcription errors/typos.*    Assessment & Plan:     Vincent Waters [facility] is a 43 y.o. adult with a PMH of idiopathic gastroparesis, GERD, and constipation who presents for follow up. Overall, he is doing better compared to his last visit.  He has been able to reduce his Reglan  dose but still requires this at least once daily.  We discussed that he should have holidays from this given risk of tardive dyskinesia and he will continue to monitor for these symptoms.    Regarding GERD, he is having minimal symptoms on once daily PPI and would not like to reduce the dose at this time. Recent EGD was unremarkable, did not show esophagitis.    Regarding constipation, he is doing well on prucalopride so we will continue this once daily.  He feels like he has made the most progress in this area since his last visit.    Plan:  1) continue pantoprazole  40 mg once daily in the morning before meals (currently taking this correctly)  2) continue prucalopride 2 mg once daily  3) continue Reglan  once daily, BID PRN every few days, and one week holidays as tolerated  4) patient is well aware of TD symptoms and will continue to monitor for these and stop Reglan  immediately if they develop.    Vincent Waters was seen today for follow-up.    Diagnoses and all orders for this visit:    Gastroparesis  -     prucalopride (MOTEGRITY ) 2 mg Tab; Take 1 tablet (2 mg total) by mouth daily.    Heartburn  -     pantoprazole  (PROTONIX ) 40 MG tablet; Take 1 tablet (40 mg total) by mouth daily.    Other orders  -     metoclopramide  (REGLAN ) 10 MG tablet; Take 1 tablet (10 mg total) by mouth two (2) times a day as needed.      Vincent Waters [facility] seen and examined with Dr. Randolm who is in agreement with above assessment and plan.    I saw and evaluated Vincent Waters [facility] with GI fellow Dr. Sonna.  I participated in key portions of the service and personally reviewed the plan of care with the patient.  I reviewed the fellow's note and agree with the fellow???s findings and plan.    Vincent PHEBE Randolm MD  Professor of Medicine  Division of Gastroenterology & Hepatology  [employer]  [facility]      History of Present Illness:     [name] is a 43 y.o. adult with a PMH significant for gastroparesis, constipation, dysphagia, throracic outlet syndrome, and ADHD  s/p CCY and hysterectomy who presents for follow up. He was last seen by Dr. Eastern in 08/2023 at which time the following was noted:    Doing well on motegrity  for constipation. Has been able to titrate reglan  to lowest effective dose (bid) for gastroparesis. He reports LLQ abdominal pain, gas, and bloating. Reports some increased frequency in rectal bleeding and intermittent rectal pain. Also worsening bloating and gas even when he is having BM. Will plan for colonoscopy and rule out SIBO. He was continues on prucalopride and metoclopramide . Colonoscopy and breath test were recommended by not completed.  Office visit 12/28/2023:  Today he reports ongoing issues with gastroparesis but he has gotten used to this. He has good days and bad days.   BMs: on prucalopride daily (Miralax  previously caused bloating, had a BM every day (may skip one BM per week), no blood in BMs but often on the paper, strains about 20-30% of the time  Abdominal pain: slightly worse than previous, on metoclopramide  twice daily before meals, located in the periumbilical region and it begins to radiate upward, feels different than GERD (on pantoprazole  40 mg once daily first thing in the morning, started 02/2023 after last EGD). He has identified eggs (bloating, abdominal pain, fecal urgency without a BM, relieved later with BM), wheat (bloating), and fiber as trigger foods for symptoms.  Plan: colonoscopy for BRBPR (skin tag, internal hemorrhoids), EGD for esophagitis reassessment (healed, normal), continue pantoprazole  40mg  once daily and prucalopride 2mg  once daily, 1 week holiday from metoclopramide , follow up breath test    Today he reports he was able to decrease his Reglan  dose for 2.5 weeks (went from BID to once daily) but then increased it due to recurrent symptoms (now on 2 doses every third day, one dose on days in between). Staying active and walking a lot seemed to help during this time. 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 This increased early satiety and bloating. The Reglan  was the last resort medication and he states everything else was tried first. He denies any TD symptoms. He has mild bloating occasionally but this is only associated with constipation.    He experience heartburn about once every two weeks. He continues to take pantoprazole  once daily which helps. He would prefer to continue to 40 mg dose.    He continues to take Motegrity  daily and this is working well. He will skip a dose of this about once every two weeks. He is having 1 to 4 BMs per day on average. He does not have worsening abdominal pain with increased BM frequency. He is no longer seeing blood in the paper. He does not have to strain to have BMs. Overall, he feels like his bowel habits are much better than at the last visit with us . He does report ongoing issues with hemorrhoid symptoms (pressure, not pain, itching, or bleeding) for which increasing dietary fiber helps. He previously followed with Dr. Fenton for this. He has a new referral in to re-establish for this.    Prior workup:  EGD 02/2023 with LA grade A esophagitis and gastritis (esophageal and stomach bx normal).   GES 02/2023 with delayed gastric emptying (45% retained food at 4hrs).  AXR 04/2022: No significant stool burden  EGD 07/2019 with 3cm hiatal hernia and significant gastroparesis with retained food, examination c/b laryngospasm.   GES in 2019 with 22% retention at 4 hours (unclear if he was on narcotics at that time)  Colonoscopy in 2017 was normal.   TSH and A1c normal.   He has elevated ANA 1:640 and was evaluated by rheumatology without concern for systemic connective tissue disease.     Past Medical and Surgical History:     Past Medical History[1]    Past Surgical History[2]  Social History:     Social History[3]  Family History:     Family History[4]  Allergies:     Allergies[5]  Home Medications:     Current Outpatient Medications   Medication Instructions    albuterol  2.5 mg, Nebulization, Every 6 hours PRN  azelastine  (ASTELIN ) 137 mcg (0.1 %) nasal spray Instill 2 sprays into each nostril two (2) times a day as needed for rhinitis.    budesonide -formoterol  (SYMBICORT ) 160-4.5 mcg/actuation inhaler Inhale 1 puff by mouth Two (2) times a day.    cetirizine  (ZYRTEC ) 10 mg, Oral, Daily (standard)    clonazePAM  (KLONOPIN ) 0.5 mg, Oral, 3 times a day PRN    dextroamphetamine -amphetamine  (ADDERALL) 20 mg tablet 20 mg, Oral, 2 times a day (standard)    dextroamphetamine -amphetamine  (ADDERALL) 20 mg tablet 20 mg, Oral, 2 times a day    [START ON 05/25/2024] dextroamphetamine -amphetamine  (ADDERALL) 20 mg tablet 20 mg, Oral, 2 times a day    [START ON 06/24/2024] dextroamphetamine -amphetamine  (ADDERALL) 20 mg tablet 20 mg, Oral, 2 times a day (standard)    doxycycline  (VIBRA -TABS) 200 mg, Oral, Once as needed, Take with full glass of water, and remain upright for 30 minutes.    doxycycline  (VIBRA -TABS) 100 mg, Oral, 2 times a day (standard)    emtricitabine -tenofovir  alafen (DESCOVY ) 200-25 mg tablet 1 tablet, Oral, Daily (standard)    EPINEPHrine  (EPIPEN ) 0.3 mg, Subcutaneous, Once    gabapentin  (NEURONTIN ) 900 mg, Oral, 3 times a day (standard)    ibuprofen  (MOTRIN ) 800 mg, Oral, Every 8 hours PRN    inhalational spacing device Spcr Use as directed with albuterol  and symbicort     ipratropium (ATROVENT ) 42 mcg (0.06 %) nasal spray 1-2 sprays in each nostril as needed for drainage, up to 4 time daily.    methocarbamol  (ROBAXIN ) 500 mg, Oral, 4 times a day, Alternatively, take 2 tablets twice daily.    metoclopramide  (REGLAN ) 10 mg, Oral, 2 times a day PRN    nebulizer and compressor (COMP-AIR NEBULIZER COMPRESSOR) Devi 1 each, Miscellaneous, Nightly PRN    needle, disp, 25 Cono (BD REGULAR BEVEL NEEDLES) 25 Wane x 5/8 Ndle For subcutaneous hormone injection.    omalizumab  (XOLAIR ) 300 mg/2 mL auto-injector Inject the contents of 1 pen (300 mg) under the skin every fourteen (14) days.    omalizumab  75 mg/0.5 mL AtIn Inject the contents of 1 pen (75 mg) under the skin every fourteen (14) days.    pantoprazole  (PROTONIX ) 40 mg, Oral, Daily (standard)    prochlorperazine (COMPAZINE) 10 mg, Oral, 2 times a day PRN    prucalopride (MOTEGRITY ) 2 mg, Oral, Daily (standard)    safety needles (BD ECLIPSE) 25 [name] x 1 Ndle use for testosterone     safety needles (BD SAFETYGLIDE NEEDLE) 18 Jayron x 1 1/2 Ndle For drawing hormone injection    syringe, disposable, (EASY TOUCH LUER LOCK SYRINGE) 1 mL Syrg Use for weekly hormone injection.    syringe, disposable, (EASY TOUCH LUER LOCK SYRINGE) 1 mL Syrg Use for testosterone     syringe, disposable, 2.5 mL Syrg Use weekly    testosterone  cypionate (DEPOTESTOTERONE CYPIONATE) 40 mg, Intramuscular, Weekly    triamcinolone  (KENALOG ) 0.1 % cream Topical, 2 times a day (standard)    varicella-zoster gE-AS01B (PF) (SHINGRIX ) injection 0.5 mL, Intramuscular    verapamil  (CALAN ) 40 MG tablet Take 1 tablet (40 mg total) by mouth nightly for 14 days, THEN 1 tablet (40 mg total) two (2) times a day for 28 days.     Objective:     Vital Signs:  Vitals:    05/03/24 1156   BP: 107/73   BP Site: R Arm   BP Position: Sitting   BP Cuff Size: Large   Pulse: 74   Temp: 36.6 ??C (97.8 ??F)  TempSrc: Temporal   Weight: 72.8 kg (160 lb 6.4 oz)   Height: 162.6 cm (5' 4)        Body mass index is 27.53 kg/m??.    Physical Exam:   Constitutional: well appearing, in no distress  Eyes: EOMI, no scleral icterus  HENT: moist mucous membranes, trachea midline  Cardiovascular: normal rate, regular rhythm, no lower extremity edema  Respiratory: CTAB with good air movement bilaterally  Gastrointestinal: bowel sounds present, abdomen non-tender, non-distended  Skin: normal turgor, no jaundice  Neurological: normal speech, no facial asymetry  Psychiatric: A&OX4, normal affect    Lab Results   Component Value Date    NA 143 02/07/2024    K 4.2 02/07/2024    CL 101 02/07/2024    CO2 33.0 (H) 02/07/2024    BUN 8 (L) 02/07/2024    CREATININE 0.86 02/07/2024    GLU 80 02/07/2024    CALCIUM 10.2 02/07/2024    MG 1.9 11/19/2021    PHOS 3.9 11/19/2021     Lab Results   Component Value Date    BILITOT 0.5 01/19/2023    BILIDIR 0.20 01/19/2023    PROT 7.9 01/19/2023    ALBUMIN 4.3 01/19/2023    ALT 10 01/19/2023    AST 18 01/19/2023    ALKPHOS 110 01/19/2023     Lab Results   Component Value Date    WBC 5.6 02/07/2024    HGB 14.4 02/07/2024    HCT 42.0 02/07/2024    PLT 304 02/07/2024     No results found for: PT, INR, APTT    Celiac serologies negative in 2020/2021    Imaging:  GES 03/16/2023:  *  12% at 30 min (Normal<30%)   *  22% at 60 min (10%<Normal<70%)   *  34% at 120 min (40%<Normal)   *  47% at 180 min (70%<Normal)   *  55% at 240 min (90%<Normal)     Procedures:  EGD 01/09/2024 for esophagitis reassessment:  - Normal esophagus. Biopsied, no EOE or other pathology  - Small hiatal hernia.  - A few fundic gland polyps. Stomach biopsies negative for H. pylori   - Normal examined duodenum.    Colonoscopy  01/09/2024 for BRBPR:  - Perianal skin tag found on perianal exam. Suspect this is the source of patient's hematochezia.  - Non-bleeding internal hemorrhoids.  - The examined portion of the ileum was normal.  - No specimens collected.    EGD for heartburn and regurgitation 03/10/2023:  - A small hiatal hernia was present.  - LA Grade A (one or more mucosal breaks less than 5 mm, not extending between tops of 2 mucosal folds) esophagitis was found. Biopsies negative for EOE  - Segmental moderate inflammation characterized by erythema, friability and linear erosions was found in the gastric body and in the gastric antrum. Biopsies negative for H. pylori  - The exam of the stomach was otherwise normal.  - The examined duodenum was normal.    Norman Slovak, MD PhD  Gastroenterology Fellow  05/03/2024 12:55 PM         [1]   Past Medical History:  Diagnosis Date    Anxiety     Arthritis     Asthma (HHS-HCC)     Autoimmune autonomic neuropathy     Breast cyst     Depression     Financial difficulties     Food intolerance March 2018    Headache, tension-type     Lack  of access to transportation     Migraines     Neuromuscular disorder (CMS-HCC)     OSA (obstructive sleep apnea)     Rectal bleeding     Restless leg syndrome     Scleroderma (CMS-HCC)     Seizures    (CMS-HCC) 07/27/2014    Reigh neurology    Sleep apnea treated with continuous positive airway pressure (CPAP)     Visual impairment    [2]   Past Surgical History:  Procedure Laterality Date    BREAST LUMPECTOMY      CHOLECYSTECTOMY  08/29/2012    COSMETIC SURGERY      HYSTERECTOMY Bilateral     08/2017    MASTECTOMY      04/2018    OOPHORECTOMY  2005    laparotomy    PR COLONOSCOPY FLX DX W/COLLJ SPEC WHEN PFRMD Left 01/09/2024    Procedure: COLONOSCOPY, FLEXIBLE, PROXIMAL TO SPLENIC FLEXURE; DIAGNOSTIC, W/WO COLLECTION SPECIMEN BY BRUSH OR WASH;  Surgeon: Mariann Norleen Mt, MD;  Location: GI PROCEDURES [facility] [facility];  Service: Gastroenterology    PR CYSTOURETHROSCOPY N/A 08/30/2017    Procedure: CYSTOURETHROSCOPY (SEPARATE PROCEDURE);  Surgeon: Rosaline Elias Kennedy, MD;  [facility];  Service: Advanced Laparoscopy    PR EXCISION TURBINATE,SUBMUCOUS Bilateral 01/04/2021    Procedure: SUBMUCOUS RESECTION INFERIOR TURBINATE, PARTIAL OR COMPLETE, ANY METHOD;  Surgeon: Adam Jordan Kimple, MD;  Location: [facility] OR [facility];  Service: ENT    PR INCISE TENDON/MUSCLE,SHLDR,SINGLE Right 11/16/2021    Procedure: RIGHT PEC MINOR RELEASE;  Surgeon: Lawayne Patience, MD;  Location: MAIN OR [place];  Service: Vascular    PR LAPAROSCOPY W TOT HYSTERECTUTERUS <=250 GRAM  W TUBE/OVARY N/A 08/30/2017    Procedure: LAPAROSCOPY, SURGICAL, WITH TOTAL HYSTERECTOMY, FOR UTERUS 250 G OR LESS; W/REMOVAL TUBE(S) AND/OR OVARY(S);  Surgeon: Rosaline Elias Kennedy, MD;  Location: [facility] OR [facility];  Service: Advanced Laparoscopy    PR MASTECTOMY, SIMPLE, COMPLETE Bilateral 01/09/2020    Procedure: MASTECTOMY, SIMPLE, COMPLETE;  Surgeon: Pauline Welford Stapler, MD;  Location: MAIN OR [place];  Service: Plastics    PR NEUROPLASTY BRACHIAL PLEXUS,OPEN Right 11/16/2021    Procedure: RIGHT TOS DECOMPRESSION;  Surgeon: Lawayne Patience, MD;  Location: MAIN OR [place];  Service: Vascular    PR REPAIR OF NASAL SEPTUM N/A 01/04/2021    Procedure: SEPTOPLASTY OR SUBMUCOUS RESECTION, WITH OR WITHOUT CARTILAGE SCORING, CONTOURING OR REPLACEMENT WITH GRAFT;  Surgeon: Adam Jordan Kimple, MD;  Location: ASC OR [facility];  Service: ENT    PR UPPER GI ENDOSCOPY,BIOPSY N/A 08/13/2019    Procedure: UGI ENDOSCOPY; WITH BIOPSY, SINGLE OR MULTIPLE;  Surgeon: Eleanor Dewey Sorrel, MD;  Location: [facility]EDURES [facility];  Service: Gastroenterology    PR UPPER GI ENDOSCOPY,BIOPSY N/A 03/10/2023    Procedure: UGI ENDOSCOPY; WITH BIOPSY, SINGLE OR MULTIPLE;  Surgeon: Imelda Kerbs, MD;  Location: [facility] PROCEDURES [facility] [facility];  Service: Gastroenterology    PR UPPER GI ENDOSCOPY,BIOPSY Left 01/09/2024    Procedure: UGI ENDOSCOPY; WITH BIOPSY, SINGLE OR MULTIPLE;  Surgeon: Mariann Norleen Mt, MD;  Location: [facility] PROCEDURES [place] [facility];  Service: Gastroenterology    SINUS SURGERY  12/20    Turbinate reduction   [3]   Social History  Socioeconomic History    Marital status: Single     Spouse name: None    Number of children: None    Years of education: None    Highest education level: None   Tobacco Use    Smoking status:  Never     Passive exposure: Never    Smokeless tobacco: Never   Vaping Use    Vaping status: Never Used   Substance and Sexual Activity    Alcohol  use: Not Currently    Drug use: Not Currently    Sexual activity: Not Currently     Partners: Male   Social History Narrative    ** Merged History Encounter **          Social Drivers of Health     Food Insecurity: Patient Declined (10/06/2023)    Hunger Vital Sign     Worried About Running Out of Food in the Last Year: Patient declined     Ran Out of Food in the Last Year: Patient declined   Tobacco Use: Low Risk (05/03/2024)    Patient History     Smoking Tobacco Use: Never     Smokeless Tobacco Use: Never     Passive Exposure: Never   Transportation Needs: Patient Declined (10/06/2023)    PRAPARE - Transportation     Lack of Transportation (Medical): Patient declined     Lack of Transportation (Non-Medical): Patient declined   Alcohol  Use: Not At Risk (07/27/2021)    Alcohol  Use     How often do you have a drink containing alcohol ?: Never     How often do you have 5 or more drinks on one occasion?: Never   Housing: Patient Declined (10/06/2023)    Housing     Within the past 12 months, have you ever stayed: outside, in a car, in a tent, in an overnight shelter, or temporarily in someone else's home (i.e. couch-surfing)?: Patient refused     Are you worried about losing your housing?: Patient refused   Physical Activity: Unknown (07/27/2021)    Exercise Vital Sign     Days of Exercise per Week: 1 day   Utilities: Patient Declined (10/06/2023)    Utilities     Within the past 12 months, have you been unable to get utilities (heat, electricity) when it was really needed?: Patient refused   Stress: Stress Concern Present (07/27/2021)    [employer] of Occupational Health - Occupational Stress Questionnaire     Feeling of Stress : To some extent Interpersonal Safety: Not At Risk (05/03/2024)    Interpersonal Safety     Unsafe Where You Currently Live: No     Physically Hurt by Anyone: No     Abused by Anyone: No   Social Connections: Moderately Isolated (07/27/2021)    Social Connection and Isolation Panel     Frequency of Communication with Friends and Family: More than three times a week     Frequency of Social Gatherings with Friends and Family: More than three times a week     Attends Religious Services: More than 4 times per year     Active Member of [employer] or Organizations: No     Attends Banker Meetings: Never     Marital Status: Never married   Physicist, Medical Strain: Patient Declined (10/06/2023)    Overall Financial Resource Strain (CARDIA)     Difficulty of Paying Living Expenses: Patient declined   Health Literacy: Medium Risk (07/27/2021)    Health Literacy     : Rarely   Internet Connectivity: Internet connectivity concern unknown-patient declined (10/06/2023)    Internet Connectivity     Do you have access to internet services: Patient Declined   [4]   Family History  Problem Relation Age  of Onset    Cancer Mother         breast    Thyroid disease Mother     Immunodeficiency Mother     Seizures Mother     Migraines Mother     Hypothyroidism Mother     Hypertension Father     Breast cancer Sister     Cancer Sister         breast    Cancer Maternal Uncle 46        Stomach Cancer    Ovarian cancer Maternal Grandmother     Breast cancer Maternal Grandmother     Cataracts Maternal Grandmother    [5]   Allergies  Allergen Reactions    Latex Rash and Hives    Penicillins Anaphylaxis     Pt can tolerate amoxicillin    Has patient had a PCN reaction causing immediate rash, facial/tongue/throat swelling, SOB or lightheadedness with hypotension: no   Has patient had a PCN reaction causing severe rash involving mucus membranes or skin necrosis: no  Has patient had a PCN reaction that required hospitalization: unknown  Has patient had a PCN reaction occurring within the last 10 years: no  If all of the above answers are NO, then may proceed with Cephalosporin use.    Shellfish Containing Products Anaphylaxis    Trileptal [Oxcarbazepine] Rash

## 2024-05-03 NOTE — Patient Instructions (Addendum)
 [facility]  Phone: [phone]         After Visit Instructions:    Medications: We have ordered a prescription refills for pantoprazole , metoclopramide  (Reglan ), and prucalopride (Motegrity ). This was sent to your pharmacy on file. As much as you're able, try not to take more than one dose of Reglan  per day. It can also help to take a holiday for a week or so occasionally if you can tolerate this. It is okay to take a second dose as needed on some days.    As usual, please ben on the look out for tardive dyskinesia symptoms such as abnormal facial or tongue movements. If you experience these, please stop Reglan  immediately.     Follow up: I will see you back in clinic in 6 months. If you need to contact me, you may either send me a MyChart message or call the office and ask to speak with the nurse. MyChart messages are for non-urgent questions and it may take a few days to receive a response.

## 2024-05-08 DIAGNOSIS — K5900 Constipation, unspecified: Principal | ICD-10-CM

## 2024-05-08 DIAGNOSIS — K3184 Gastroparesis: Principal | ICD-10-CM

## 2024-05-08 NOTE — Telephone Encounter (Signed)
 Received prescription (Motegrity  ) claim rejections.  PA submitted.

## 2024-05-13 NOTE — Telephone Encounter (Signed)
 Left a voicemail to confirm appt for tomorrow with Dr. Concha An at 10;20 am.

## 2024-05-14 NOTE — Progress Notes (Signed)
 05/14/2024 - Patient originally requested delivery for 05/16/2024. Delivery is not possible on this date due to being closed for the holiday. I have reached out to the patient and confirmed that delivery on 05/15/2024 is ok.    Vincent Waters Refill Coordination Note    Vincent Waters, Vincent Waters: 12/06/1980  Phone: [phone] (home)       All above HIPAA information was verified with patient.         05/13/2024     1:03 PM   Specialty Rx Medication Refill Questionnaire   Which Medications would you like refilled and shipped? Xolair    Please list all current allergies: Pen latex   Have you missed any doses in the last 30 days? No   Have you had any changes to your medication(s) since your last refill? No   How much of each medication do you have remaining at home? (eg. number of tablets, injections, etc.) 0   If receiving an injectable medication, next injection date is 05/17/2054   Have you experienced any side effects in the last 30 days? No   Please enter the full address (street address, city, state, zip code) where you would like your medication(s) to be delivered to. [address] [place] [address]   Please specify on which day you would like your medication(s) to arrive. Note: if you need your medication(s) within 3 days, please call the Waters to schedule your order at [phone]  05/16/2024   Has your insurance changed since your last refill? No   Would you like a pharmacist to call you to discuss your medication(s)? No   Do you require a signature for your package? (Note: if we are billing Medicare Part B or your order contains a controlled substance, we will require a signature) No   I have been provided my out of pocket cost for my medication and approve the Waters to charge the amount to my credit card on file. Yes         Completed refill call assessment today to schedule patient's medication shipment from the [facility] [phone]). All relevant notes have been reviewed.       Confirmed patient received a Conservation Officer, Historic Buildings and a Surveyor, Mining with first shipment. The patient will receive a drug information handout for each medication shipped and additional FDA Medication Guides as required.         REFERRAL TO PHARMACIST     Referral to the pharmacist: Not needed      [facility]     Shipping address confirmed in Epic.     Delivery Scheduled: Yes, Expected medication delivery date: 05/15/2024.     Medication will be delivered via Same Day Courier to the prescription address in Epic WAM.     [name]   [facility] and Home Delivery [facility] Specialty Technician

## 2024-05-15 MED FILL — XOLAIR 300 MG/2 ML SUBCUTANEOUS AUTO-INJECTOR: SUBCUTANEOUS | 28 days supply | Qty: 4 | Fill #4

## 2024-05-15 MED FILL — XOLAIR 75 MG/0.5 ML SUBCUTANEOUS AUTO-INJECTOR: SUBCUTANEOUS | 28 days supply | Qty: 1 | Fill #4

## 2024-05-23 DIAGNOSIS — F411 Generalized anxiety disorder: Principal | ICD-10-CM

## 2024-05-23 DIAGNOSIS — F902 Attention-deficit hyperactivity disorder, combined type: Principal | ICD-10-CM

## 2024-05-23 NOTE — Patient Instructions (Signed)
 Follow-up instructions:  -- Please continue taking your medications as prescribed for your mental health.   -- Do not make changes to your medications, including taking more or less than prescribed, unless under the supervision of your physician. Be aware that some medications may make you feel worse if abruptly stopped.  -- Please refrain from using illicit substances, as these can affect your mood and could cause anxiety or other concerning symptoms.   -- Seek further medical care for any increase in symptoms or new symptoms such as thoughts of wanting to hurt yourself or hurt others.     Contact info:  Life-threatening emergencies: call 911 or go to the nearest ER for medical or psychiatric attention.  Crisis line [phone]    Urgent questions, Non-urgent routine concerns, questions, and refill requests: please call the clinic at [phone].      [facility]  [address], [address]  [place], [place] [address]    Office [phone]         Regarding appointments:  - If you need to cancel your appointment, we ask that you call  [phone]  at least 24 hours before your scheduled appointment.  - If for any reason you arrive 15 minutes later than your scheduled appointment time, you may not be seen and your visit may be rescheduled.  - Please remember that we will not automatically reschedule missed appointments.  - If you miss two (2) appointments without letting us  know in advance, you will likely be referred to a provider in your community.  - We will do our best to be on time. Sometimes an emergency will arise that might cause your clinician to be late. We will try to inform you of this when you check in for your appointment. If you wait more than 15 minutes past your appointment time without such notice, please speak with the front desk staff.    In the event of bad weather, the clinic staff will attempt to contact you, should your appointment need to be rescheduled. Additionally, you can call the Patient Weather Line [phone] for system-wide clinic status     For more information and reminders regarding clinic policies (these were provided when you were admitted to the clinic), please ask the front desk.      Current Outpatient Medications:     albuterol  2.5 mg /3 mL (0.083 %) nebulizer solution, Inhale 3 mL (2.5 mg total) by nebulization every six (6) hours as needed for wheezing or shortness of breath., Disp: 90 mL, Rfl: 1    azelastine  (ASTELIN ) 137 mcg (0.1 %) nasal spray, Instill 2 sprays into each nostril two (2) times a day as needed for rhinitis., Disp: 30 mL, Rfl: 1    budesonide -formoterol  (SYMBICORT ) 160-4.5 mcg/actuation inhaler, Inhale 1 puff by mouth Two (2) times a day., Disp: 10.2 g, Rfl: 5    cetirizine  (ZYRTEC ) 10 MG tablet, Take 1 tablet (10 mg total) by mouth daily., Disp: 30 tablet, Rfl: 12    clonazePAM  (KLONOPIN ) 0.5 MG tablet, Take 1 tablet (0.5 mg total) by mouth Three (3) times a day as needed for anxiety or sleep., Disp: 90 tablet, Rfl: 5    dextroamphetamine -amphetamine  (ADDERALL) 20 mg tablet, Take 1 tablet (20 mg total) by mouth two (2) times a day., Disp: 60 tablet, Rfl: 0    [START ON 05/25/2024] dextroamphetamine -amphetamine  (ADDERALL) 20 mg tablet, Take 1 tablet (20 mg total) by mouth two (2) times a day., Disp: 60 tablet,  Rfl: 0    [START ON 06/24/2024] dextroamphetamine -amphetamine  (ADDERALL) 20 mg tablet, Take 1 tablet (20 mg total) by mouth two (2) times a day., Disp: 60 tablet, Rfl: 0    doxycycline  (VIBRA -TABS) 100 MG tablet, Take 2 tablets (200 mg total) by mouth once as needed (ideally within 24 hours, but no later than 72 hours after condomless sex) for up to 10 doses. Take with full glass of water, and remain upright for 30 minutes., Disp: 20 tablet, Rfl: 11    emtricitabine -tenofovir  alafen (DESCOVY ) 200-25 mg tablet, Take 1 tablet by mouth daily., Disp: 90 tablet, Rfl: 3    EPINEPHrine  (EPIPEN ) 0.3 mg/0.3 mL injection, Inject 0.3 mL (0.3 mg total) under the skin once., Disp: 2 each, Rfl: 0    gabapentin  (NEURONTIN ) 250 mg/5 mL oral solution, Take 18 mL (900 mg total) by mouth Three (3) times a day., Disp: 1620 mL, Rfl: 2    ibuprofen  (MOTRIN ) 800 MG tablet, Take 1 tablet (800 mg total) by mouth every eight (8) hours as needed for pain. (Patient not taking: Reported on 05/03/2024), Disp: 60 tablet, Rfl: 2    inhalational spacing device Spcr, Use as directed with albuterol  and symbicort , Disp: 1 each, Rfl: 1    ipratropium (ATROVENT ) 42 mcg (0.06 %) nasal spray, 1-2 sprays in each nostril as needed for drainage, up to 4 time daily., Disp: 15 mL, Rfl: 2    methocarbamol  (ROBAXIN ) 500 MG tablet, Take 1 tablet (500 mg total) by mouth four (4) times a day. Alternatively, take 2 tablets twice daily., Disp: 120 tablet, Rfl: 2    metoclopramide  (REGLAN ) 10 MG tablet, Take 1 tablet (10 mg total) by mouth two (2) times a day as needed., Disp: 120 tablet, Rfl: 3    nebulizer and compressor (COMP-AIR NEBULIZER COMPRESSOR) Devi, 1 each by Miscellaneous route nightly as needed., Disp: 1 each, Rfl: 0    needle, disp, 25 Bryne (BD REGULAR BEVEL NEEDLES) 25 Ledger x 5/8 Ndle, For subcutaneous hormone injection., Disp: 25 each, Rfl: 0    omalizumab  (XOLAIR ) 300 mg/2 mL auto-injector, Inject the contents of 1 pen (300 mg) under the skin every fourteen (14) days., Disp: 4 mL, Rfl: 11    omalizumab  75 mg/0.5 mL AtIn, Inject the contents of 1 pen (75 mg) under the skin every fourteen (14) days., Disp: 1 mL, Rfl: 11    pantoprazole  (PROTONIX ) 40 MG tablet, Take 1 tablet (40 mg total) by mouth daily., Disp: 90 tablet, Rfl: 3    prucalopride (MOTEGRITY ) 2 mg Tab, Take 1 tablet (2 mg total) by mouth daily., Disp: 90 tablet, Rfl: 3    safety needles (BD ECLIPSE) 25 Severin x 1 Ndle, use for testosterone , Disp: 12 each, Rfl: 0    safety needles (BD SAFETYGLIDE NEEDLE) 18 Salik x 1 1/2 Ndle, For drawing hormone injection, Disp: 25 each, Rfl: 0    syringe, disposable, (EASY TOUCH LUER LOCK SYRINGE) 1 mL Syrg, Use for weekly hormone injection., Disp: 4 each, Rfl: 0    syringe, disposable, (EASY TOUCH LUER LOCK SYRINGE) 1 mL Syrg, Use for testosterone , Disp: 12 each, Rfl: 0    syringe, disposable, 2.5 mL Syrg, Use weekly, Disp: 30 Syringe, Rfl: 2    testosterone  cypionate (DEPOTESTOTERONE CYPIONATE) 200 mg/mL injection, Inject 0.2 mL (40 mg total) into the muscle once a week., Disp: 12 mL, Rfl: 0    triamcinolone  (KENALOG ) 0.1 % cream, Apply topically Two (2) times a day., Disp: 15 g, Rfl: 0  varicella-zoster gE-AS01B (PF) (SHINGRIX ) injection, Inject 0.5 mL into the muscle., Disp: 0.5 mL, Rfl: 1    verapamil  (CALAN ) 40 MG tablet, Take 1 tablet (40 mg total) by mouth nightly for 14 days, THEN 1 tablet (40 mg total) two (2) times a day for 28 days., Disp: 60 tablet, Rfl: 2    Current Facility-Administered Medications:     omalizumab  (XOLAIR ) injection 300 mg, 300 mg, Subcutaneous, Q28 Days, [name], [name], [name], 300 mg at 05/24/22 1138      Urgent Crisis Resources    [facility] Crisis Line  Call: [phone]    [phone] - National Suicide Hotline    [facility]    [facility]  [phone]    24/7 mobile crisis response for behavioral health crises    [place] [employer] Crisis Unit  [phone]    24 hour co-response team - for domestic violence, people in mental health crisis, traumatic situations, needing safety planning, victims of crime, stalking/harassment, outreach to vulnerable people    [facility]  [address]  [place], [place] [address]  [phone]  24/7 crisis facility - mental health assessment, treatment and stabilization    [facility]  [phone]    [facility])  [address], [place],    [place] [address]  Ph: [phone].    [facility] ([facility] and        Assessment) , 24/7  [address] [place] [address]      Ph: [phone]    [facility] Crisis [facility]  [phone] (24 hours a day, 7 days a week)    [facility] Crisis Line   [phone] (24 hours a day, 7 days a week)    National Suicide and Crisis Lifeline   988 (24 hours a day, 7 days a week)

## 2024-05-23 NOTE — Progress Notes (Signed)
 [facility]  Psychiatry   Established Patient E&M Service - Outpatient                                                                   [facility] - [place]    Name: Vincent Waters  Date: 05/23/2024  MRN: [id number]  DOB: [birth year]  PCP: Debrah Morene Cadet, MD    Assessment:    [facility] presents for follow-up evaluation. Pt is pleasant and cooperative. Pt reports he is feeling better with extra dose of Klonopin  - able to sleep well, anxiety under better control, overall feeling well. No safety concerns. Follow up in 2 months.         Identifying Information:  Vincent Waters is a 43 y.o. adult with a history of GAD, Panic disorder, ADHD. Pt has long hx of anxiety, ADHD (since age 32), former dx of Bipolar disorder which has been unfounded.     Risk Assessment:  An assessment of suicide and violence risk factors was performed as part of this evaluation and is not significantly increased from the last visit.   While future psychiatric events cannot be accurately predicted, the patient does not currently require acute inpatient psychiatric care and does not currently meet [place]  involuntary commitment criteria.      Plan:    Problem: GAD/panic disorder  Status of problem: chronic and stable  Interventions:   -- continue Klonopin  0.5mg  PO BID and add extra 0.5mg  PO at bedtime for insomnia, for anxiety/panic/insomnia, inc to 0.5mg  PO BID and 0.5mg  if cannot fall asleep after 1 hour after HS dose, inc 04/25/24    -- pt has long hx of being on multiple past meds, did not respond well to SSRI's      Problem: ADHD  Status of problem: worsening  Interventions:   --continue Adderall 20mg  PO BID, restarted 08/17/23, inc 11/09/23 (pt prefers immediate release over XR formulation). Pt was dx as child, started on Ritalin age 81 and in supportive classes at school.   -- does not like long acting stimulants, upset stomach      Psychotherapy provided:  No billable psychotherapy service provided.      There is no height or weight on file to calculate BMI.    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 Debrah     Labs:  Per PCP  Not indicated    Guardian:  self  Therapy:  Sees Dr. Debrah every 2 weeks - able to talk to him about anything  [ X] [place] therapy referral made 04/25/24    Refills sent: none needed    Scheduled follow up visit:  07/26/24 at 0930 for 30 min with abby Priyah Schmuck    Patient has been given information on how to contact this clinician for concerns. The patient has been instructed to call 911 for emergencies.      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    Denies SI/HI      Objective:    Mental Status Exam:  Appearance:    Appears stated age, Well nourished, Well developed, and Clean/Neat Motor:   No abnormal movements   Speech/Language:    Normal rate, volume, tone, fluency   Mood:   better   Affect:   Calm, Cooperative, and Euthymic   Thought process and Associations:   Logical, linear, clear, coherent, goal directed   Abnormal/psychotic thought content:     Denies SI, HI, self harm, delusions, obsessions, paranoid ideation, or ideas of reference   Perceptual disturbances:     Denies auditory and visual hallucinations, behavior not concerning for response to internal stimuli     Other:          Visit was completed by video (or phone) and the appropriate disclaimer has been included below.      The patient reports they are physically located in [place]  and is currently: not at home.(In car) I conducted a audio/video visit. I spent  58m 56s on the video call with the patient. I spent an additional 7 minutes on pre- and post-visit activities on the date of service .   I personally spent 21 minutes face-to-face and non-face-to-face in the care of this patient, which includes all pre, intra, and post visit time on the date of service.  All documented time was specific to the E/M visit and does not include any procedures that may have been performed.          Lavanda GORMAN Shape, PMHNP  May 23, 2024 9:52 AM     [facility] - virtual clinic  [address]  [place], [place] [address]    Office [phone]

## 2024-05-25 MED ORDER — DEXTROAMPHETAMINE-AMPHETAMINE 20 MG TABLET
ORAL_TABLET | Freq: Two times a day (BID) | ORAL | 0 refills | 30.00000 days | Status: CP
Start: 2024-05-25 — End: 2024-06-24
  Filled 2024-06-03: qty 60, 30d supply, fill #0

## 2024-05-28 MED FILL — CLONAZEPAM 0.5 MG TABLET: ORAL | 30 days supply | Qty: 90 | Fill #1

## 2024-05-28 MED FILL — GABAPENTIN 250 MG/5 ML ORAL SOLUTION: ORAL | 26 days supply | Fill #2

## 2024-06-04 MED ORDER — GABAPENTIN 250 MG/5 ML ORAL SOLUTION
Freq: Three times a day (TID) | ORAL | 2 refills | 30.00000 days
Start: 2024-06-04 — End: ?

## 2024-06-07 NOTE — Progress Notes (Signed)
 [facility] and [facility] Delivery Pharm[facility] Refill Coordination Note    Vincent Waters, [name]: 1981-04-15  Phone: [phone] (home)       All above HIPAA information was verified with patient.         06/06/2024    12:40 PM   Specialty Rx Medication Refill Questionnaire   Which Medications would you like refilled and shipped? Xolair    Please list all current allergies: Penicillin   Have you missed any doses in the last 30 days? No   Have you had any changes to your medication(s) since your last refill? No   How much of each medication do you have remaining at home? (eg. number of tablets, injections, etc.) 0   If receiving an injectable medication, next injection date is 06/16/2024   Have you experienced any side effects in the last 30 days? No   Please enter the full address (street address, city, state, zip code) where you would like your medication(s) to be delivered to. [address] [name] [address]   Please specify on which day you would like your medication(s) to arrive. Note: if you need your medication(s) within 3 days, please call the pharmacy to schedule your order at [phone]  06/11/2024   Has your insurance changed since your last refill? No   Would you like a pharmacist to call you to discuss your medication(s)? No   Do you require a signature for your package? (Note: if we are billing Medicare Part B or your order contains a controlled substance, we will require a signature) No   I have been provided my out of pocket cost for my medication and approve the pharmacy to charge the amount to my credit card on file. Yes         Completed refill call assessment today to schedule patient's medication shipment from the [facility] and [facility] [phone]).  All relevant notes have been reviewed.       Confirmed patient received a Conservation Officer, Historic Buildings and a Surveyor, Mining with first shipment. The patient will receive a drug information handout for each medication shipped and additional FDA Medication Guides as required.         REFERRAL TO PHARMACIST     Referral to the pharmacist: Not needed      [facility]     Shipping address confirmed in Epic.     Delivery Scheduled: Yes, Expected medication delivery date: 12/23.     Medication will be delivered via Next Day Courier to the prescription address in Epic WAM.    Vincent Waters   [place] [facility] Delivery Pharmacy Specialty Technician

## 2024-06-07 NOTE — Progress Notes (Signed)
 [facility]  Established Patient Clinic Note    Assessment/Plan:   Vincent Waters is a 43 y.o.adult who presents for follow-up.    Problem List Items Addressed This Visit          Respiratory    Chronic rhinitis     Other Visit Diagnoses         Impacted cerumen of both ears    -  Primary          - Current meds: Current Medications[1]  Assessment & Plan  # Impacted cerumen  Chronic ear pain and itching with cerumen impaction. History of recurrent ear infections and sinus congestion. Extracted a large amount of cerumen from both ears on today's exam, after which hearing improved significantly. Otoscopic exam was notable for mild erythema of external auditory canals but not to the extent expected with true otitis externa; rather, expect this was caused by irritation from impacted cerumen. Bilateral TM slightly bulging but without purulence or discoloration, reassuring against otitis media. After discussion of risks and benefits, pt elects to defer topical or PO antibiotics for now, and instead will try daily debrox drops. Return precautions provided, pt verbalizes understanding and has no further questions or concerns at this time.   - Performed manual cerumen removal with scooper.  - Recommended over-the-counter Debrox drops to thin earwax and prevent future impaction.  - Will consider antibiotics if symptoms do not improve.    # Chronic rhinosinusitis  Persistent sinus congestion and recurrent ear infections, possibly due to sinus drainage. Symptoms persist despite current treatment.  - Recommend ENT follow-up (pt already established)  - Continue current management per above    HEALTH MAINTENANCE ITEMS STILL DUE:  Health Maintenance Due   Topic Date Due    COVID-19 Vaccine (7 - 2025-26 season) 02/19/2024    Influenza Vaccine (1) 02/19/2024     Follow-up: No follow-ups on file.    Future Appointments   Date Time Provider Department Center   06/21/2024 12:35 PM Argentina Geofm CROME, FNP [facility]   06/25/2024  1:30 PM Melvenia Child [facility]   07/01/2024 10:00 AM Reside, Marcey PARAS, DMD [facility]   07/10/2024  1:30 PM Sebastian Pierce, FNP [facility]   07/26/2024  9:30 AM Arne Lavanda Bang, PMHNP [facility]   07/29/2024  8:30 AM Caid, Comer PARAS, MD [facility]   09/19/2024 10:00 AM Caro Concha Bruckner, MD [facility]       Subjective   Vincent Waters is a 43 y.o. adult  coming to clinic today for follow-up.    Chief Complaint   Patient presents with    Both ear keep popping     History of Present Illness  Vincent Waters is a 43 year old male who presents with ear pain and wax buildup.    He has ongoing issues with ear pain, itchiness, and reduced hearing. His ears are described as 'waxy' with significant wax accumulation, which he believes contributes to his symptoms. This is his third occurrence of ear infections, and he notes frequent popping in his ears, particularly the left one.    He is currently on Xolair , which has improved his condition but not resolved it completely. He has not used Debrox drops for ear wax and does not have any at home. He recalls having tubes in his ears as a child but was told he no longer needed them.    I have  reviewed the problem list, medications, and allergies and have updated/reconciled them if needed.    Vincent Waters  reports that he has never smoked. He has never been exposed to tobacco smoke. He has never used smokeless tobacco.  Health Maintenance   Topic Date Due    COVID-19 Vaccine (7 - 2025-26 season) 02/19/2024    Influenza Vaccine (1) 02/19/2024    DTaP/Tdap/Td Vaccines (2 - Td or Tdap) 08/19/2027    Lipid Screening  11/01/2028    Pneumococcal Vaccine 0-49  Completed    Hepatitis C Screen  Completed       Objective     VITALS: BP 128/80 (BP Site: R Arm, BP Position: Sitting, BP Cuff Size: Large)  - Pulse 81  - Temp 36.9 ??C (98.4 ??F) (Temporal)  - Resp 16  - Ht 162.6 cm (5' 4.02)  - LMP 07/20/2017  - BMI 27.52 kg/m??     Physical Exam  General: well-appearing, sitting upright in no acute distress  Head: Normocephalic, atraumatic  ENT: No dental trauma noted. Initially a large amount of impacted cerumen was present; after extraction, mild erythema of external auditory canals and slight bulging of bilateral TMs but without purulence or discoloration.  Eyes: conjunctiva normal, non-erythematous, non-icteric, no discharge.  Neck: no thyroid enlargement or masses  Lungs: No increased work of breathing or audible wheezing  Skin: Warm, dry, no erythema or rash on exposed skin  Musculoskeletal: No visible gait abnormalities  Neurologic: Alert & oriented x 3, no gross sensorimotor abnormalities  Psychiatric: Pleasant, cooperative, good eye contact, appropriate thought processes    Wt Readings from Last 3 Encounters:   05/03/24 72.8 kg (160 lb 6.4 oz)   04/19/24 71.5 kg (157 lb 9.6 oz)   03/16/24 70.8 kg (156 lb)      PHQ-9 PHQ-9 Total Score   10/22/2020   9:00 AM 6    09/04/2020   9:00 AM 3    08/21/2020   9:00 AM 5    05/22/2020   3:00 PM 9        Data saved with a previous flowsheet row definition     LABS/IMAGING: I have reviewed pertinent recent labs and imaging in Epic.    I personally spent a total of 20 minutes taking care of this patient including time with the patient, pre-visit planning, documentation, reviewing labs, coordinating care, and reviewing documentation from other providers in [facility] and in other specialties.  This was all done on the day of the visit.  My (and this clinic's) longitudinal relationship with this patient was instrumental in caring for this patient today including caring for their chronic medical and psychiatric conditions.      Odis Aho, MD, MPH (he/him)  [facility]  [address]   [place], [place] [address]  Phone: [phone]  Fax: [phone]       [1]   Current Outpatient Medications:     budesonide -formoterol  (SYMBICORT ) 160-4.5 mcg/actuation inhaler, Inhale 1 puff by mouth Two (2) times a day., Disp: 10.2 g, Rfl: 5    cetirizine  (ZYRTEC ) 10 MG tablet, Take 1 tablet (10 mg total) by mouth daily., Disp: 30 tablet, Rfl: 12    clonazePAM  (KLONOPIN ) 0.5 MG tablet, Take 1 tablet (0.5 mg total) by mouth Three (3) times a day as needed for anxiety or sleep., Disp: 90 tablet, Rfl: 5    dextroamphetamine -amphetamine  (ADDERALL) 20 mg tablet, Take 1 tablet (20 mg total) by mouth two (2) times a  day., Disp: 60 tablet, Rfl: 0    [START ON 06/24/2024] dextroamphetamine -amphetamine  (ADDERALL) 20 mg tablet, Take 1 tablet (20 mg total) by mouth two (2) times a day., Disp: 60 tablet, Rfl: 0    doxycycline  (VIBRA -TABS) 100 MG tablet, Take 2 tablets (200 mg total) by mouth once as needed (ideally within 24 hours, but no later than 72 hours after condomless sex) for up to 10 doses. Take with full glass of water, and remain upright for 30 minutes., Disp: 20 tablet, Rfl: 11    emtricitabine -tenofovir  alafen (DESCOVY ) 200-25 mg tablet, Take 1 tablet by mouth daily., Disp: 90 tablet, Rfl: 3    gabapentin  (NEURONTIN ) 250 mg/5 mL oral solution, Take 18 mL (900 mg total) by mouth Three (3) times a day., Disp: 1620 mL, Rfl: 2    ibuprofen  (MOTRIN ) 800 MG tablet, Take 1 tablet (800 mg total) by mouth every eight (8) hours as needed for pain., Disp: 60 tablet, Rfl: 2    inhalational spacing device Spcr, Use as directed with albuterol  and symbicort , Disp: 1 each, Rfl: 1    ipratropium (ATROVENT ) 42 mcg (0.06 %) nasal spray, 1-2 sprays in each nostril as needed for drainage, up to 4 time daily., Disp: 15 mL, Rfl: 2    methocarbamol  (ROBAXIN ) 500 MG tablet, Take 1 tablet (500 mg total) by mouth four (4) times a day. Alternatively, take 2 tablets twice daily., Disp: 120 tablet, Rfl: 2    metoclopramide  (REGLAN ) 10 MG tablet, Take 1 tablet (10 mg total) by mouth two (2) times a day as needed., Disp: 120 tablet, Rfl: 3    nebulizer and compressor (COMP-AIR NEBULIZER COMPRESSOR) Devi, 1 each by Miscellaneous route nightly as needed., Disp: 1 each, Rfl: 0    needle, disp, 25 Janiel (BD REGULAR BEVEL NEEDLES) 25 Lesslie x 5/8 Ndle, For subcutaneous hormone injection., Disp: 25 each, Rfl: 0    omalizumab  (XOLAIR ) 300 mg/2 mL auto-injector, Inject the contents of 1 pen (300 mg) under the skin every fourteen (14) days., Disp: 4 mL, Rfl: 11    omalizumab  75 mg/0.5 mL AtIn, Inject the contents of 1 pen (75 mg) under the skin every fourteen (14) days., Disp: 1 mL, Rfl: 11    pantoprazole  (PROTONIX ) 40 MG tablet, Take 1 tablet (40 mg total) by mouth daily., Disp: 90 tablet, Rfl: 3    prucalopride (MOTEGRITY ) 2 mg Tab, Take 1 tablet (2 mg total) by mouth daily., Disp: 90 tablet, Rfl: 3    safety needles (BD ECLIPSE) 25 Durand x 1 Ndle, use for testosterone , Disp: 12 each, Rfl: 0    safety needles (BD SAFETYGLIDE NEEDLE) 18 Conner x 1 1/2 Ndle, For drawing hormone injection, Disp: 25 each, Rfl: 0    syringe, disposable, (EASY TOUCH LUER LOCK SYRINGE) 1 mL Syrg, Use for weekly hormone injection., Disp: 4 each, Rfl: 0    syringe, disposable, (EASY TOUCH LUER LOCK SYRINGE) 1 mL Syrg, Use for testosterone , Disp: 12 each, Rfl: 0    syringe, disposable, 2.5 mL Syrg, Use weekly, Disp: 30 Syringe, Rfl: 2    testosterone  cypionate (DEPOTESTOTERONE CYPIONATE) 200 mg/mL injection, Inject 0.2 mL (40 mg total) into the muscle once a week., Disp: 12 mL, Rfl: 0    triamcinolone  (KENALOG ) 0.1 % cream, Apply topically Two (2) times a day., Disp: 15 g, Rfl: 0    varicella-zoster gE-AS01B (PF) (SHINGRIX ) injection, Inject 0.5 mL into the muscle., Disp: 0.5 mL, Rfl: 1    albuterol  2.5 mg /3 mL (  0.083 %) nebulizer solution, Inhale 3 mL (2.5 mg total) by nebulization every six (6) hours as needed for wheezing or shortness of breath., Disp: 90 mL, Rfl: 1    azelastine  (ASTELIN ) 137 mcg (0.1 %) nasal spray, Instill 2 sprays into each nostril two (2) times a day as needed for rhinitis., Disp: 30 mL, Rfl: 1    EPINEPHrine  (EPIPEN ) 0.3 mg/0.3 mL injection, Inject 0.3 mL (0.3 mg total) under the skin once., Disp: 2 each, Rfl: 0    verapamil  (CALAN ) 40 MG tablet, Take 1 tablet (40 mg total) by mouth nightly for 14 days, THEN 1 tablet (40 mg total) two (2) times a day for 28 days., Disp: 60 tablet, Rfl: 2    Current Facility-Administered Medications:     omalizumab  (XOLAIR ) injection 300 mg, 300 mg, Subcutaneous, Q28 Days, [name], [name], [name], 300 mg at 05/24/22 1138

## 2024-06-10 MED FILL — XOLAIR 75 MG/0.5 ML SUBCUTANEOUS AUTO-INJECTOR: SUBCUTANEOUS | 28 days supply | Qty: 1 | Fill #5

## 2024-06-10 MED FILL — XOLAIR 300 MG/2 ML SUBCUTANEOUS AUTO-INJECTOR: SUBCUTANEOUS | 28 days supply | Qty: 4 | Fill #5

## 2024-06-11 NOTE — Patient Instructions (Signed)
 Hi Vincent Waters,    It was great to see you today. I look forward to seeing you at your next visit. In the meantime, please feel free to message me on mychart with any non-time-sensitive questions or requests. For any time-sensitive matters (including completion of medical clearance, school/work physical or other forms), please call [phone] to request a phone, video, or in-person visit with me or one of my colleagues.     Additionally, [facility] has an Urgent Care at our [facility] location! Call [phone] for same-day appointments, available Monday-Friday 7am-8pm; Saturday and Sunday 12pm-5pm. If you think you are having an emergency, please call 911 or go to your nearest emergency department.     If you ever need urgent help with your mental health, including for any thoughts of self-harm or suicide, please call the [place] mental health crisis line at [phone], or the national suicide prevention hotline at 713-525-3688.     Take care,    Valdene Garret, MD, MPH (he/him)  [facility] at [facility]  [address]   [place], [place] [address]  Phone: [phone]  Fax: [phone]

## 2024-06-16 ENCOUNTER — Encounter: Admit: 2024-06-16 | Discharge: 2024-06-16 | Payer: BLUE CROSS/BLUE SHIELD

## 2024-06-16 DIAGNOSIS — N898 Other specified noninflammatory disorders of vagina: Principal | ICD-10-CM

## 2024-06-16 NOTE — Progress Notes (Signed)
 [facility] CLINIC NOTE    ASSESSMENT/PLAN:    Assessment & Plan  Chronic recurrent vaginitis  Recurrent BV linked to sexual activity. Ineffective past treatments with oral Flagyl  and clindamycin . Persistent symptoms with clindamycin  gel. Possible Flagyl  resistance. Differential includes infection or anatomical issue post-surgery.  - Continue clindamycin  gel for one more day.  - Referred to urogynecologist for further evaluation.  - Discussed potential partner treatment for BV with partner.    Recurrent gonococcal infection  Recurrent gonorrhea with recent episode. No lesions. Previous treatment with doxycycline  and Descovy  for PrEP. Partner receptive to treatment.  - Swabbed throat for chlamydia and gonorrhea.  - Administer gonorrhea shot if positive.  - Discuss partner treatment options if BV persists.    General health maintenance  Discussed vaccinations. Adverse reaction to first shingles shot. No flu shot received.  - Follow up on shingles vaccination status.  - Consider flu vaccination.      See AVS for additional discharge instructions.          Patient declines AVS.  Prefers to read/get via MyChart.     Vincent Waters is ina agreement with the plan of care     Chief Complaint   Patient presents with    STD testing     Used BV meds and doxyprep       Results  Vaginal swab collection  Vaginal swab collected for laboratory analysis.    Throat swab collection  Throat swab collected for chlamydia and gonorrhea.     SUBJECTIVE:    History of Present Illness  Vincent Waters is a 43 year old male who presents with vaginal discharge.    He has a history of recurrent bacterial vaginosis that flares up with sexual activity. Previous treatments, including metronidazole  in both pill and gel form, have become ineffective. He is on the last day of a course of clindamycin  gel but still experiences yellow discharge, which is unusual for him.    He has a history of gonorrhea, diagnosed last year and again this year. Typically, he receives a shot for gonorrhea and pills for chlamydia when diagnosed. He is proactive in self-diagnosing and seeks treatment promptly when symptoms persist.    His current medications include albuterol , Astelin  nasal spray, Symbicort , Zyrtec , clonazepam , Adderall, doxycycline , Descovy , gabapentin , ibuprofen  as needed, Atrovent  nasal spray, Robaxin , Reglan , pantoprazole , Motegrity , testosterone  injections, triamcinolone , and verapamil . He is also on a nebulizer and receives Xolair  injections.    No recent surgeries or hospitalizations. No known drug allergies, although penicillin is mistakenly listed as an allergy in his records. He does not smoke, drink alcohol , or use recreational drugs. He has been on testosterone  therapy since 2017 and reports a recent increase in sexual activity, attributed to hypersexuality since his transition. He has been in a long-term relationship with a cisgender straight male partner but recently engaged in sexual activity outside of this relationship.    No lesions or other symptoms aside from the discharge.      I have reviewed the patients problem list, medical history, surgical history, laboratory history and recent hospitalizations, current medications, allergies, and social history and updated them as needed.    Review of symptoms:  Negative unless  Otherwise stated in HPI.     Vincent Waters  reports that he has never smoked. He has never been exposed to tobacco smoke. He has never used smokeless tobacco.    OBJECTIVE:    VITALS:   Vitals:  06/16/24 1204   BP: 139/87   Pulse: 101   Temp: 36.7 ??C (98.1 ??F)    Wt:   Wt Readings from Last 3 Encounters:   06/16/24 73.7 kg (162 lb 6.4 oz)   05/03/24 72.8 kg (160 lb 6.4 oz)   04/19/24 71.5 kg (157 lb 9.6 oz)       Physical Exam       Gen: Pleasant and cooperative in NAD, resting comfortably, appears stated age  Head: Normocephalic, atraumatic  Psych:  Mood is good, able to carry on normal conversation, with good eye contact, without evidence of anxiety or depression    LABS:  No results found for any visits on 06/16/24.    STUDIES:  No results found.    ___________________________________  CURRENT MEDS:  Current Medications[1]    ___________________________________  ALLERGIES:  Allergies[2]  ------------------------    PAST MEDICAL HISTORY:   Past Medical History[3]      FMURGENTCARETRACKING       Did today's visit save an ED/Direct Admission?  yes    Referral Made to Specialist Other  Urogynecology           [facility]  at [facility]  [address] [place], [place] [address]  Telephone [phone]  Fax [phone]  [email or web address]         [1]   Current Outpatient Medications   Medication Sig Dispense Refill    budesonide -formoterol  (SYMBICORT ) 160-4.5 mcg/actuation inhaler Inhale 1 puff by mouth Two (2) times a day. 10.2 g 5    cetirizine  (ZYRTEC ) 10 MG tablet Take 1 tablet (10 mg total) by mouth daily. 30 tablet 12    clonazePAM  (KLONOPIN ) 0.5 MG tablet Take 1 tablet (0.5 mg total) by mouth Three (3) times a day as needed for anxiety or sleep. 90 tablet 5    dextroamphetamine -amphetamine  (ADDERALL) 20 mg tablet Take 1 tablet (20 mg total) by mouth two (2) times a day. 60 tablet 0    [START ON 06/24/2024] dextroamphetamine -amphetamine  (ADDERALL) 20 mg tablet Take 1 tablet (20 mg total) by mouth two (2) times a day. 60 tablet 0    doxycycline  (VIBRA -TABS) 100 MG tablet Take 2 tablets (200 mg total) by mouth once as needed (ideally within 24 hours, but no later than 72 hours after condomless sex) for up to 10 doses. Take with full glass of water, and remain upright for 30 minutes. 20 tablet 11    emtricitabine -tenofovir  alafen (DESCOVY ) 200-25 mg tablet Take 1 tablet by mouth daily. 90 tablet 3    gabapentin  (NEURONTIN ) 250 mg/5 mL oral solution Take 18 mL (900 mg total) by mouth Three (3) times a day. 1620 mL 2    ibuprofen  (MOTRIN ) 800 MG tablet Take 1 tablet (800 mg total) by mouth every eight (8) hours as needed for pain. 60 tablet 2    inhalational spacing device Spcr Use as directed with albuterol  and symbicort  1 each 1    ipratropium (ATROVENT ) 42 mcg (0.06 %) nasal spray 1-2 sprays in each nostril as needed for drainage, up to 4 time daily. 15 mL 2    methocarbamol  (ROBAXIN ) 500 MG tablet Take 1 tablet (500 mg total) by mouth four (4) times a day. Alternatively, take 2 tablets twice daily. 120 tablet 2    metoclopramide  (REGLAN ) 10 MG tablet Take 1 tablet (10 mg total) by mouth two (2) times a day as needed. 120 tablet 3  nebulizer and compressor (COMP-AIR NEBULIZER COMPRESSOR) Devi 1 each by Miscellaneous route nightly as needed. 1 each 0    needle, disp, 25 Lebron (BD REGULAR BEVEL NEEDLES) 25 Bridger x 5/8 Ndle For subcutaneous hormone injection. 25 each 0    omalizumab  (XOLAIR ) 300 mg/2 mL auto-injector Inject the contents of 1 pen (300 mg) under the skin every fourteen (14) days. 4 mL 11    omalizumab  75 mg/0.5 mL AtIn Inject the contents of 1 pen (75 mg) under the skin every fourteen (14) days. 1 mL 11    pantoprazole  (PROTONIX ) 40 MG tablet Take 1 tablet (40 mg total) by mouth daily. 90 tablet 3    prucalopride (MOTEGRITY ) 2 mg Tab Take 1 tablet (2 mg total) by mouth daily. 90 tablet 3    safety needles (BD ECLIPSE) 25 Tyce x 1 Ndle use for testosterone  12 each 0    safety needles (BD SAFETYGLIDE NEEDLE) 18 Caley x 1 1/2 Ndle For drawing hormone injection 25 each 0    syringe, disposable, (EASY TOUCH LUER LOCK SYRINGE) 1 mL Syrg Use for weekly hormone injection. 4 each 0    syringe, disposable, (EASY TOUCH LUER LOCK SYRINGE) 1 mL Syrg Use for testosterone  12 each 0    syringe, disposable, 2.5 mL Syrg Use weekly 30 Syringe 2    testosterone  cypionate (DEPOTESTOTERONE CYPIONATE) 200 mg/mL injection Inject 0.2 mL (40 mg total) into the muscle once a week. 12 mL 0    triamcinolone  (KENALOG ) 0.1 % cream Apply topically Two (2) times a day. 15 g 0    varicella-zoster gE-AS01B (PF) (SHINGRIX ) injection Inject 0.5 mL into the muscle. 0.5 mL 1    albuterol  2.5 mg /3 mL (0.083 %) nebulizer solution Inhale 3 mL (2.5 mg total) by nebulization every six (6) hours as needed for wheezing or shortness of breath. 90 mL 1    azelastine  (ASTELIN ) 137 mcg (0.1 %) nasal spray Instill 2 sprays into each nostril two (2) times a day as needed for rhinitis. 30 mL 1    EPINEPHrine  (EPIPEN ) 0.3 mg/0.3 mL injection Inject 0.3 mL (0.3 mg total) under the skin once. 2 each 0    verapamil  (CALAN ) 40 MG tablet Take 1 tablet (40 mg total) by mouth nightly for 14 days, THEN 1 tablet (40 mg total) two (2) times a day for 28 days. 60 tablet 2     Current Facility-Administered Medications   Medication Dose Route Frequency Provider Last Rate Last Admin    omalizumab  (XOLAIR ) injection 300 mg  300 mg Subcutaneous Q28 Days [name], [name], [name]   300 mg at 05/24/22 1138   [2]   Allergies  Allergen Reactions    Latex Rash and Hives    Shellfish Containing Products Anaphylaxis    Trileptal [Oxcarbazepine] Rash   [3]   Past Medical History:  Diagnosis Date    Anxiety     Arthritis     Asthma (HHS-HCC)     Autoimmune autonomic neuropathy     Breast cyst     Chronic pain disorder     Dental abscess     Depression     Financial difficulties     Food intolerance March 2018    GERD (gastroesophageal reflux disease)     GI disease     Headache, tension-type     Lack of access to transportation     Migraine     Migraines     Neuromuscular disorder (CMS-HCC)  OSA (obstructive sleep apnea)     Rectal bleeding     Restless leg syndrome     Scleroderma (CMS-HCC)     Seizures    (CMS-HCC) 07/27/2014    [facility] neurology    Sleep apnea treated with continuous positive airway pressure (CPAP)     Tooth sensitivity     Visual impairment

## 2024-06-17 DIAGNOSIS — N898 Other specified noninflammatory disorders of vagina: Principal | ICD-10-CM

## 2024-06-17 DIAGNOSIS — B379 Candidiasis, unspecified: Principal | ICD-10-CM

## 2024-06-17 MED ORDER — BORIC ACID 600 MG VAGINAL SUPPOSITORY
Freq: Every evening | VAGINAL | 0 refills | 14.00000 days | Status: CP
Start: 2024-06-17 — End: 2024-07-01

## 2024-06-17 NOTE — Telephone Encounter (Signed)
 Discussed organism and referral to I/D for evaluation fo Vaginal yeast.     Candida group NAAT Not Detected    Candida glabrata/Candida krusei NAAT      Patient is in agreement with the plan of cre.

## 2024-06-18 DIAGNOSIS — B9689 Other specified bacterial agents as the cause of diseases classified elsewhere: Principal | ICD-10-CM

## 2024-06-18 DIAGNOSIS — A549 Gonococcal infection, unspecified: Principal | ICD-10-CM

## 2024-06-18 DIAGNOSIS — N760 Acute vaginitis: Principal | ICD-10-CM

## 2024-06-18 MED ADMIN — cefTRIAXone (ROCEPHIN) intramuscular injection 500 mg: 500 mg | INTRAMUSCULAR | @ 15:00:00 | Stop: 2024-06-18

## 2024-06-24 MED ORDER — DEXTROAMPHETAMINE-AMPHETAMINE 20 MG TABLET
ORAL_TABLET | Freq: Two times a day (BID) | ORAL | 0 refills | 30.00000 days | Status: CP
Start: 2024-06-24 — End: 2024-07-24
  Filled 2024-07-03: qty 60, 30d supply, fill #0

## 2024-06-26 NOTE — Telephone Encounter (Signed)
 01/07 Mailbox full need to reschedule provider out.

## 2024-06-28 ENCOUNTER — Encounter
Admit: 2024-06-28 | Discharge: 2024-06-28 | Payer: BLUE CROSS/BLUE SHIELD | Attending: Student in an Organized Health Care Education/Training Program | Primary: Student in an Organized Health Care Education/Training Program

## 2024-06-28 DIAGNOSIS — Z79899 Other long term (current) drug therapy: Principal | ICD-10-CM

## 2024-06-28 DIAGNOSIS — G540 Brachial plexus disorders: Principal | ICD-10-CM

## 2024-06-28 DIAGNOSIS — G894 Chronic pain syndrome: Principal | ICD-10-CM

## 2024-06-28 DIAGNOSIS — M7918 Myalgia, other site: Principal | ICD-10-CM

## 2024-06-28 DIAGNOSIS — B3731 Vaginal candidiasis: Principal | ICD-10-CM

## 2024-06-28 LAB — CREATININE
CREATININE: 0.97 mg/dL (ref 0.73–1.02)
EGFR CKD-EPI (2021) FEMALE: 75 mL/min/1.73m2 (ref >=60)

## 2024-06-28 MED ORDER — NYSTATIN 100,000 UNIT/GRAM TOPICAL OINTMENT
TOPICAL | 0 refills | 0.00000 days | Status: CP
Start: 2024-06-28 — End: ?

## 2024-06-28 MED ORDER — GABAPENTIN 250 MG/5 ML ORAL SOLUTION
Freq: Three times a day (TID) | ORAL | 0 refills | 30.00000 days | Status: CP
Start: 2024-06-28 — End: ?
  Filled 2024-07-03: 30d supply, fill #0

## 2024-06-28 MED ORDER — BORIC ACID 600 MG VAGINAL SUPPOSITORY
Freq: Every evening | VAGINAL | 0 refills | 14.00000 days | Status: CP
Start: 2024-06-28 — End: 2024-07-12

## 2024-06-28 NOTE — Progress Notes (Signed)
 Vincent Waters  Established Patient Clinic Note    Assessment/Plan:   Vincent Waters is a 44 y.o.adult who presents for follow-up.    Problem List Items Addressed This Visit          Other    On pre-exposure prophylaxis for HIV    Relevant Orders    Syphilis Screen    Vaginitis Molecular Panel    Chlamydia/Gonorrhoeae NAA    HIV Antigen/Antibody Combo    Creatinine     Other Visit Diagnoses         Vaginal candidiasis    -  Primary    Relevant Medications    nystatin  (MYCOSTATIN ) 100,000 unit/gram ointment    boric acid 600 mg Supp          - Current meds: Current Medications[1]  Assessment & Plan  # Non-albicans vaginal candidiasis  Symptoms recurred despite initial resolution with boric acid x7 days. Discussed that full 14 days is recommended and the reduced course may have resulted in symptom recurrence. After discussion of eval/management options, pt would like to proceed with repeat wet prep and GC/CT testing to confirm symptoms not 2/2 new alternate source, otherwise will re-treat with boric acid x14 days. If refractory can treat with nightly nystatin  suppositories x14 days. Return precautions provided, pt verbalizes understanding and has no further questions or concerns at this time.   - Repeat wet prep + GC/CT testing (urine)  - Re-treat with vaginal boric acid x14 days  - If refractory, treat with nightly nystatin  suppositories (100,000 u nightly suppository, compounded from ointment)  - Proceed with ID consult (referred by [facility] provider)    # Pre-exposure prophylaxis (PrEP) against HIV  Patient is sexually active transgender man with male and male partners. Multiple new sexual partners and condom-less sexual encounters over last 3 months Known exposure to HIV: no. Known exposure to other STIs: yes (just treated for gonorrhea per above). IV drug use: no. Condom use: inconsistent. Denies fever, fatigue, skin rash, pharyngitis, cervical lymphadenopathy, or any other symptoms concerning for acute HIV. HEENT exam without pharyngeal erythema, tonsillar edema or exudate, palpably enlarged LN, or rash on exposed skin. Based on this information, they are at high risk for contracting HIV and are a good candidate for pre-exposure prophylaxis against HIV. This is a preventive medicine service and E/M modifier 33 should apply to all testing and prescriptions. The lab tests listed below are required to ensure the safety of continuing pre-exposure prophylaxis (PrEP) against HIV.  - Labs drawn today: HIV, Cr, RPR, GC/CT (urine only)  - Repeat monitoring labs in 3 months  - Provided adherence and risk reduction / safer sex counseling  - Continue Descovy  + DoxyPEP as prescribed    HEALTH MAINTENANCE ITEMS STILL DUE:  Health Maintenance Due   Topic Date Due    COVID-19 Vaccine (7 - 2025-26 season) 02/19/2024    Influenza Vaccine (1) 02/19/2024     Follow-up: No follow-ups on file.    Future Appointments   Date Time Provider Department Center   07/04/2024  8:45 AM Leverne Lacks, MD [facility]OM[facility]   07/09/2024  2:00 PM Tanda Karon Rucks, MD UNCINFDISET [facility]   07/10/2024  1:30 PM Sebastian Pierce, FNP ANESPAINMRKT [facility]   07/26/2024  9:30 AM Arne Lavanda Bang, PMHNP OPTCVilcom [facility]   07/29/2024  8:30 AM Caid, Comer PARAS, MD UNCALLERGET [facility]   09/02/2024 11:00 AM Reside, Marcey PARAS, DMD UNCDFPOMLSUG [facility]  09/19/2024 10:00 AM Caro, Concha Bruckner, MD CARDSTHPOINT [facility]       Subjective   Vincent Waters is a 44 y.o. adult  coming to clinic today for follow-up.    Chief Complaint   Patient presents with    Annual Exam    Exposure to STD     Wants testing done. Recent visit to urgent care positive for gonorrhea and bacterial vaginitis     History of Present Illness  Vincent Waters [facility] is a 44 year old transgender male who presents with recurrent yeast infections.     He has been experiencing recurrent yeast infections, with the current episode identified as Candida krusei at an urgent care visit last week. This is his first time testing positive for non-albicans candida. Initial treatment with boric acid for seven days temporarily alleviated symptoms, but he noticed a recurrence, including irritation, a malodorous discharge and a clammy sensation in the genital area. Of note, he also recently underwent treatment for gonorrhea as well, after a sexual encounter with a new partner.     He is currently on PrEP and has taken doxy-PEP as well as part of his regimen.    I have reviewed the problem list, medications, and allergies and have updated/reconciled them if needed.    Vincent Waters  reports that he has never smoked. He has never been exposed to tobacco smoke. He has never used smokeless tobacco.  Health Maintenance   Topic Date Due    COVID-19 Vaccine (7 - 2025-26 season) 02/19/2024    Influenza Vaccine (1) 02/19/2024    DTaP/Tdap/Td Vaccines (2 - Td or Tdap) 08/19/2027    Lipid Screening  11/01/2028    Pneumococcal Vaccine 0-49  Completed    Hepatitis C Screen  Completed     Objective     VITALS: BP 126/76 (BP Site: R Arm, BP Position: Sitting, BP Cuff Size: Medium)  - Pulse 87  - Temp 36.9 ??C (98.4 ??F) (Temporal)  - LMP 07/20/2017     Physical Exam  General: well-appearing, sitting upright in no acute distress  Head: Normocephalic, atraumatic  ENT: No dental trauma noted.   Eyes: conjunctiva normal, non-erythematous, non-icteric, no discharge.  Neck: no thyroid enlargement or masses  Lungs: No increased work of breathing or audible wheezing  Skin: Warm, dry, no erythema or rash on exposed skin  Musculoskeletal: No visible gait abnormalities  Neurologic: Alert & oriented x 3, no gross sensorimotor abnormalities  Psychiatric: Pleasant, cooperative, good eye contact, appropriate thought processes    Wt Readings from Last 3 Encounters:   06/16/24 73.7 kg (162 lb 6.4 oz)   05/03/24 72.8 kg (160 lb 6.4 oz)   04/19/24 71.5 kg (157 lb 9.6 oz)      PHQ-9 PHQ-9 Total Score   10/22/2020 9:00 AM 6    09/04/2020   9:00 AM 3    08/21/2020   9:00 AM 5    05/22/2020   3:00 PM 9        Data saved with a previous flowsheet row definition     LABS/IMAGING: I have reviewed pertinent recent labs and imaging in Epic.    Vincent Aho, MD, Vincent Waters (he/him)  [facility]  [address]   [place], [place] [address]  Phone: [phone]  Fax: [phone]         [1]   Current Outpatient Medications:     albuterol  2.5 mg /3 mL (0.083 %) nebulizer solution, Inhale 3 mL (2.5  mg total) by nebulization every six (6) hours as needed for wheezing or shortness of breath., Disp: 90 mL, Rfl: 1    azelastine  (ASTELIN ) 137 mcg (0.1 %) nasal spray, Instill 2 sprays into each nostril two (2) times a day as needed for rhinitis., Disp: 30 mL, Rfl: 1    boric acid 600 mg Supp, Insert 1 suppository (600 mg total) into the vagina at bedtime for 14 days., Disp: 14 suppository, Rfl: 0    budesonide -formoterol  (SYMBICORT ) 160-4.5 mcg/actuation inhaler, Inhale 1 puff by mouth Two (2) times a day., Disp: 10.2 g, Rfl: 5    cetirizine  (ZYRTEC ) 10 MG tablet, Take 1 tablet (10 mg total) by mouth daily., Disp: 30 tablet, Rfl: 12    clonazePAM  (KLONOPIN ) 0.5 MG tablet, Take 1 tablet (0.5 mg total) by mouth Three (3) times a day as needed for anxiety or sleep., Disp: 90 tablet, Rfl: 5    dextroamphetamine -amphetamine  (ADDERALL) 20 mg tablet, Take 1 tablet (20 mg total) by mouth two (2) times a day., Disp: 60 tablet, Rfl: 0    dextroamphetamine -amphetamine  (ADDERALL) 20 mg tablet, Take 1 tablet (20 mg total) by mouth two (2) times a day., Disp: 60 tablet, Rfl: 0    doxycycline  (VIBRA -TABS) 100 MG tablet, Take 2 tablets (200 mg total) by mouth once as needed (ideally within 24 hours, but no later than 72 hours after condomless sex) for up to 10 doses. Take with full glass of water, and remain upright for 30 minutes., Disp: 20 tablet, Rfl: 11    emtricitabine -tenofovir  alafen (DESCOVY ) 200-25 mg tablet, Take 1 tablet by mouth daily., Disp: 90 tablet, Rfl: 3    EPINEPHrine  (EPIPEN ) 0.3 mg/0.3 mL injection, Inject 0.3 mL (0.3 mg total) under the skin once., Disp: 2 each, Rfl: 0    gabapentin  (NEURONTIN ) 250 mg/5 mL oral solution, Take 18 mL (900 mg total) by mouth Three (3) times a day., Disp: 1620 mL, Rfl: 2    ibuprofen  (MOTRIN ) 800 MG tablet, Take 1 tablet (800 mg total) by mouth every eight (8) hours as needed for pain., Disp: 60 tablet, Rfl: 2    inhalational spacing device Spcr, Use as directed with albuterol  and symbicort , Disp: 1 each, Rfl: 1    ipratropium (ATROVENT ) 42 mcg (0.06 %) nasal spray, 1-2 sprays in each nostril as needed for drainage, up to 4 time daily., Disp: 15 mL, Rfl: 2    methocarbamol  (ROBAXIN ) 500 MG tablet, Take 1 tablet (500 mg total) by mouth four (4) times a day. Alternatively, take 2 tablets twice daily., Disp: 120 tablet, Rfl: 2    metoclopramide  (REGLAN ) 10 MG tablet, Take 1 tablet (10 mg total) by mouth two (2) times a day as needed., Disp: 120 tablet, Rfl: 3    nebulizer and compressor (COMP-AIR NEBULIZER COMPRESSOR) Devi, 1 each by Miscellaneous route nightly as needed., Disp: 1 each, Rfl: 0    needle, disp, 25 Hong (BD REGULAR BEVEL NEEDLES) 25 Demari x 5/8 Ndle, For subcutaneous hormone injection., Disp: 25 each, Rfl: 0    nystatin  (MYCOSTATIN ) 100,000 unit/gram ointment, Place one suppository (100,000) in vagina every night for 14 nights., Disp: 30 g, Rfl: 0    omalizumab  (XOLAIR ) 300 mg/2 mL auto-injector, Inject the contents of 1 pen (300 mg) under the skin every fourteen (14) days., Disp: 4 mL, Rfl: 11    omalizumab  75 mg/0.5 mL AtIn, Inject the contents of 1 pen (75 mg) under the skin every fourteen (14) days., Disp: 1 mL, Rfl:  11    pantoprazole  (PROTONIX ) 40 MG tablet, Take 1 tablet (40 mg total) by mouth daily., Disp: 90 tablet, Rfl: 3    prucalopride (MOTEGRITY ) 2 mg Tab, Take 1 tablet (2 mg total) by mouth daily., Disp: 90 tablet, Rfl: 3    safety needles (BD ECLIPSE) 25 Eleanor x 1 Ndle, use for testosterone , Disp: 12 each, Rfl: 0    safety needles (BD SAFETYGLIDE NEEDLE) 18 Gehrig x 1 1/2 Ndle, For drawing hormone injection, Disp: 25 each, Rfl: 0    syringe, disposable, (EASY TOUCH LUER LOCK SYRINGE) 1 mL Syrg, Use for weekly hormone injection., Disp: 4 each, Rfl: 0    syringe, disposable, (EASY TOUCH LUER LOCK SYRINGE) 1 mL Syrg, Use for testosterone , Disp: 12 each, Rfl: 0    syringe, disposable, 2.5 mL Syrg, Use weekly, Disp: 30 Syringe, Rfl: 2    testosterone  cypionate (DEPOTESTOTERONE CYPIONATE) 200 mg/mL injection, Inject 0.2 mL (40 mg total) into the muscle once a week., Disp: 12 mL, Rfl: 0    triamcinolone  (KENALOG ) 0.1 % cream, Apply topically Two (2) times a day., Disp: 15 g, Rfl: 0    varicella-zoster gE-AS01B (PF) (SHINGRIX ) injection, Inject 0.5 mL into the muscle., Disp: 0.5 mL, Rfl: 1    verapamil  (CALAN ) 40 MG tablet, Take 1 tablet (40 mg total) by mouth nightly for 14 days, THEN 1 tablet (40 mg total) two (2) times a day for 28 days., Disp: 60 tablet, Rfl: 2    Current Facility-Administered Medications:     omalizumab  (XOLAIR ) injection 300 mg, 300 mg, Subcutaneous, Q28 Days, Vincent Waters, Vincent Waters, [name], 300 mg at 05/24/22 1138

## 2024-06-30 LAB — HIV ANTIGEN/ANTIBODY COMBO: HIV ANTIGEN/ANTIBODY COMBO: NONREACTIVE

## 2024-07-01 LAB — SYPHILIS SCREEN: SYPHILIS RPR SCREEN: NONREACTIVE

## 2024-07-03 DIAGNOSIS — Z79899 Other long term (current) drug therapy: Principal | ICD-10-CM

## 2024-07-03 DIAGNOSIS — F640 Transsexualism: Principal | ICD-10-CM

## 2024-07-03 DIAGNOSIS — Z789 Other specified health status: Principal | ICD-10-CM

## 2024-07-03 MED ORDER — EASY TOUCH LUER LOCK SYRINGE 1 ML
0 refills | 0.00000 days | Status: CP
Start: 2024-07-03 — End: ?

## 2024-07-03 MED ORDER — BD REGULAR BEVEL NEEDLES 25 GAUGE X 5/8"
INTRAMUSCULAR | 0 refills | 0.00000 days | Status: CP
Start: 2024-07-03 — End: ?

## 2024-07-03 MED ORDER — SAFETY NEEDLES 18 GAUGE X 1 1/2"
0 refills | 0.00000 days | Status: CP
Start: 2024-07-03 — End: ?

## 2024-07-03 MED FILL — CLONAZEPAM 0.5 MG TABLET: ORAL | 30 days supply | Qty: 90 | Fill #2

## 2024-07-03 NOTE — Telephone Encounter (Signed)
 sent

## 2024-07-09 MED ORDER — GABAPENTIN 250 MG/5 ML ORAL SOLUTION
Freq: Three times a day (TID) | ORAL | 0 refills | 30.00000 days
Start: 2024-07-09 — End: ?

## 2024-07-10 ENCOUNTER — Encounter
Admit: 2024-07-10 | Discharge: 2024-07-10 | Payer: BLUE CROSS/BLUE SHIELD | Attending: Student in an Organized Health Care Education/Training Program | Primary: Student in an Organized Health Care Education/Training Program

## 2024-07-10 DIAGNOSIS — N898 Other specified noninflammatory disorders of vagina: Principal | ICD-10-CM

## 2024-07-10 LAB — URINALYSIS WITH MICROSCOPY WITH CULTURE REFLEX PERFORMABLE
BILIRUBIN UA: NEGATIVE
BLOOD UA: NEGATIVE
GLUCOSE UA: NEGATIVE
KETONES UA: NEGATIVE
NITRITE UA: NEGATIVE
PH UA: 7.5 (ref 5.0–9.0)
PROTEIN UA: NEGATIVE
RBC UA: 2 /HPF (ref <=3)
SPECIFIC GRAVITY UA: 1.021 (ref 1.003–1.030)
SQUAMOUS EPITHELIAL: <1 /HPF (ref 0–5)
UROBILINOGEN UA: <2
WBC UA: 48 /HPF — ABNORMAL HIGH (ref <=2)

## 2024-07-15 DIAGNOSIS — J4550 Severe persistent asthma, uncomplicated: Principal | ICD-10-CM

## 2024-07-15 NOTE — Progress Notes (Signed)
 07/15/2024 - Patient originally requested delivery for 01/28. Delivery is not possible on this date due to weather. I have reached out to the patient and confirmed that delivery on 01/29 is ok.

## 2024-07-15 NOTE — Patient Instructions (Signed)
 Hi Rosmary,    It was great to see you today. I look forward to seeing you at your next visit. In the meantime, please feel free to message me on mychart with any non-time-sensitive questions or requests. For any time-sensitive matters (including completion of medical clearance, school/work physical or other forms), please call [phone] to request a phone, video, or in-person visit with me or one of my colleagues.     Additionally, [facility] has an Urgent Care at our [facility] location! Call [phone] for same-day appointments, available Monday-Friday 7am-8pm; Saturday and Sunday 12pm-5pm. If you think you are having an emergency, please call 911 or go to your nearest emergency department.     If you ever need urgent help with your mental health, including for any thoughts of self-harm or suicide, please call the [place] mental health crisis line at [phone], or the national suicide prevention hotline at 713-525-3688.     Take care,    Valdene Garret, MD, MPH (he/him)  [facility]  [address]   [place], [place] [address]  Phone: [phone]  Fax: [phone]

## 2024-07-15 NOTE — Progress Notes (Signed)
 [facility]  Established Patient Clinic Note    Assessment/Plan:   North is a 44 y.o.adult who presents for follow-up.    Problem List Items Addressed This Visit    None  Visit Diagnoses         Vaginal discharge    -  Primary    Relevant Orders    Vaginitis Molecular Panel (Completed)    Urinalysis with Microscopy with Culture Reflex (Completed)    Chlamydia/Gonorrhoeae NAA (Completed)    Urine Culture (Completed)          - Current meds: Current Medications[1]  Assessment & Plan  # Vaginal discharge  New watery, yellowish discharge post-treatment for non-albicans candidiasis. Resolved burning and irritation suggest candidiasis treatment success. Differential includes BV, trichomonas, or GC/CT possibly linked to silicone sex toy use without regular cleaning. Previous gonorrhea and chlamydia tests negative. Increased urinary frequency noted.   - Performed self-swab test for yeast, BV and trichomonas.  - Repeated gonorrhea and chlamydia testing  - UA with reflex culture collected  - Advised on proper cleaning of silicone sex toys    HEALTH MAINTENANCE ITEMS STILL DUE:  Health Maintenance Due   Topic Date Due    COVID-19 Vaccine (7 - 2025-26 season) 02/19/2024    Influenza Vaccine (1) 02/19/2024     Follow-up: No follow-ups on file.    Future Appointments   Date Time Provider Department Center   07/25/2024  9:00 AM Sebastian Pierce, FNP ANESPAINMRKT [facility]   07/26/2024  9:30 AM Arne Lavanda Bang, PMHNP OPTCVilcom [facility]   07/29/2024  8:30 AM Caid, Comer PARAS, MD MELVERN [facility]   08/16/2024  9:00 AM Melvenia Child [id number] [facility]   09/02/2024 11:00 AM Reside, Marcey PARAS, DMD UNCDFPOMLSUG [facility]   09/19/2024 10:00 AM Caro Concha Bruckner, MD CARDSTHPOINT [facility]       Subjective   Vincent Waters is a 44 y.o. adult  coming to clinic today for follow-up.    Chief Complaint   Patient presents with    Follow-up     History of Present Illness  Vincent Waters is a 44 year old male who presents with persistent discharge after treatment for a yeast infection.    He has persistent watery discharge with a slight yellow tint following treatment for a yeast infection. The discharge is not constant and began after using boric acid capsules, which were prescribed following a positive test for Candida krusei/glabrata. Subsequent testing showed no yeast detected.    Initially, he experienced symptoms of irritation and burning, which have since resolved. He denies any pain or burning during urination but mentions frequent urination, especially after drinking fluids. He has not experienced any redness, pain, or itching recently, which were present at the onset of the yeast infection.    He uses a silicone sex toy and believes it may have contributed to irritation and rawness in the past.    I have reviewed the problem list, medications, and allergies and have updated/reconciled them if needed.    Vincent Waters  reports that he has never smoked. He has never been exposed to tobacco smoke. He has never used smokeless tobacco.  Health Maintenance   Topic Date Due    COVID-19 Vaccine (7 - 2025-26 season) 02/19/2024    Influenza Vaccine (1) 02/19/2024    DTaP/Tdap/Td Vaccines (2 - Td or Tdap) 08/19/2027    Lipid Screening  11/01/2028    Pneumococcal Vaccine 0-49  Completed  Hepatitis C Screen  Completed     Objective     VITALS: BP 123/77 (BP Site: L Arm, BP Position: Sitting)  - Pulse 86  - Temp 36.8 ??C (98.2 ??F) (Oral)  - Resp 18  - Ht 162.6 cm (5' 4)  - LMP 07/20/2017  - BMI 27.88 kg/m??     Physical Exam  General: well-appearing, sitting upright in no acute distress  Head: Normocephalic, atraumatic  ENT: No dental trauma noted.   Eyes: conjunctiva normal, non-erythematous, non-icteric, no discharge.  Neck: no thyroid enlargement or masses  Lungs: No increased work of breathing or audible wheezing  Skin: Warm, dry, no erythema or rash on exposed skin  Musculoskeletal: No visible gait abnormalities  Neurologic: Alert & oriented x 3, no gross sensorimotor abnormalities  Psychiatric: Pleasant, cooperative, good eye contact, appropriate thought processes    Wt Readings from Last 3 Encounters:   06/16/24 73.7 kg (162 lb 6.4 oz)   05/03/24 72.8 kg (160 lb 6.4 oz)   04/19/24 71.5 kg (157 lb 9.6 oz)      PHQ-9 PHQ-9 Total Score   10/22/2020   9:00 AM 6    09/04/2020   9:00 AM 3    08/21/2020   9:00 AM 5    05/22/2020   3:00 PM 9        Data saved with a previous flowsheet row definition     LABS/IMAGING: I have reviewed pertinent recent labs and imaging in Epic.    I personally spent a total of 20 minutes taking care of this patient including time with the patient, pre-visit planning, documentation, reviewing labs, coordinating care, and reviewing documentation from other providers in [facility] and in other specialties.  This was all done on the day of the visit.  My (and this clinic's) longitudinal relationship with this patient was instrumental in caring for this patient today including caring for their chronic medical and psychiatric conditions.      Odis Aho, MD, MPH (he/him)  [facility] at [facility]  [address]   [place], [place] [address]  Phone: [phone]  Fax: [phone]       [1]   Current Outpatient Medications:     albuterol  2.5 mg /3 mL (0.083 %) nebulizer solution, Inhale 3 mL (2.5 mg total) by nebulization every six (6) hours as needed for wheezing or shortness of breath., Disp: 90 mL, Rfl: 1    azelastine  (ASTELIN ) 137 mcg (0.1 %) nasal spray, Instill 2 sprays into each nostril two (2) times a day as needed for rhinitis., Disp: 30 mL, Rfl: 1    budesonide -formoterol  (SYMBICORT ) 160-4.5 mcg/actuation inhaler, Inhale 1 puff by mouth Two (2) times a day., Disp: 10.2 g, Rfl: 5    cetirizine  (ZYRTEC ) 10 MG tablet, Take 1 tablet (10 mg total) by mouth daily., Disp: 30 tablet, Rfl: 12    clonazePAM  (KLONOPIN ) 0.5 MG tablet, Take 1 tablet (0.5 mg total) by mouth Three (3) times a day as needed for anxiety or sleep., Disp: 90 tablet, Rfl: 5    dextroamphetamine -amphetamine  (ADDERALL) 20 mg tablet, Take 1 tablet (20 mg total) by mouth two (2) times a day., Disp: 60 tablet, Rfl: 0    doxycycline  (VIBRA -TABS) 100 MG tablet, Take 2 tablets (200 mg total) by mouth once as needed (ideally within 24 hours, but no later than 72 hours after condomless sex) for up to 10 doses. Take with full glass of water, and remain upright for 30 minutes.,  Disp: 20 tablet, Rfl: 11    emtricitabine -tenofovir  alafen (DESCOVY ) 200-25 mg tablet, Take 1 tablet by mouth daily., Disp: 90 tablet, Rfl: 3    EPINEPHrine  (EPIPEN ) 0.3 mg/0.3 mL injection, Inject 0.3 mL (0.3 mg total) under the skin once., Disp: 2 each, Rfl: 0    gabapentin  (NEURONTIN ) 250 mg/5 mL oral solution, Take 18 mL (900 mg total) by mouth Three (3) times a day., Disp: 1620 mL, Rfl: 0    ibuprofen  (MOTRIN ) 800 MG tablet, Take 1 tablet (800 mg total) by mouth every eight (8) hours as needed for pain., Disp: 60 tablet, Rfl: 2    inhalational spacing device Spcr, Use as directed with albuterol  and symbicort , Disp: 1 each, Rfl: 1    ipratropium (ATROVENT ) 42 mcg (0.06 %) nasal spray, 1-2 sprays in each nostril as needed for drainage, up to 4 time daily., Disp: 15 mL, Rfl: 2    methocarbamol  (ROBAXIN ) 500 MG tablet, Take 1 tablet (500 mg total) by mouth four (4) times a day. Alternatively, take 2 tablets twice daily., Disp: 120 tablet, Rfl: 2    metoclopramide  (REGLAN ) 10 MG tablet, Take 1 tablet (10 mg total) by mouth two (2) times a day as needed., Disp: 120 tablet, Rfl: 3    nebulizer and compressor (COMP-AIR NEBULIZER COMPRESSOR) Devi, 1 each by Miscellaneous route nightly as needed., Disp: 1 each, Rfl: 0    needle, disp, 25 Tibor (BD REGULAR BEVEL NEEDLES) 25 Hollis x 5/8 Ndle, For subcutaneous hormone injection., Disp: 25 each, Rfl: 0    nystatin  (MYCOSTATIN ) 100,000 unit/gram ointment, Place one suppository (100,000) in vagina every night for 14 nights., Disp: 30 g, Rfl: 0    omalizumab  (XOLAIR ) 300 mg/2 mL auto-injector, Inject the contents of 1 pen (300 mg) under the skin every fourteen (14) days., Disp: 4 mL, Rfl: 11    omalizumab  75 mg/0.5 mL AtIn, Inject the contents of 1 pen (75 mg) under the skin every fourteen (14) days., Disp: 1 mL, Rfl: 11    pantoprazole  (PROTONIX ) 40 MG tablet, Take 1 tablet (40 mg total) by mouth daily., Disp: 90 tablet, Rfl: 3    prucalopride (MOTEGRITY ) 2 mg Tab, Take 1 tablet (2 mg total) by mouth daily., Disp: 90 tablet, Rfl: 3    safety needles (BD ECLIPSE) 25 Lerone x 1 Ndle, use for testosterone , Disp: 12 each, Rfl: 0    safety needles (BD SAFETYGLIDE NEEDLE) 18 Olu[place] x 1 1/2 Ndle, For drawing hormone injection, Disp: 25 each, Rfl: 0    syringe, disposable, (EASY TOUCH LUER LOCK SYRINGE) 1 mL Syrg, Use for weekly hormone injection., Disp: 4 each, Rfl: 0    syringe, disposable, (EASY TOUCH LUER LOCK SYRINGE) 1 mL Syrg, Use for testosterone , Disp: 25 each, Rfl: 0    syringe, disposable, 2.5 mL Syrg, Use weekly, Disp: 30 Syringe, Rfl: 2    testosterone  cypionate (DEPOTESTOTERONE CYPIONATE) 200 mg/mL injection, Inject 0.2 mL (40 mg total) into the muscle once a week., Disp: 12 mL, Rfl: 0    triamcinolone  (KENALOG ) 0.1 % cream, Apply topically Two (2) times a day., Disp: 15 g, Rfl: 0    varicella-zoster gE-AS01B (PF) (SHINGRIX ) injection, Inject 0.5 mL into the muscle., Disp: 0.5 mL, Rfl: 1    verapamil  (CALAN ) 40 MG tablet, Take 1 tablet (40 mg total) by mouth nightly for 14 days, THEN 1 tablet (40 mg total) two (2) times a day for 28 days., Disp: 60 tablet, Rfl: 2    Current Facility-Administered  Medications:     omalizumab  (XOLAIR ) injection 300 mg, 300 mg, Subcutaneous, Q28 Days, [name], [name], [name], 300 mg at 05/24/22 1138

## 2024-07-15 NOTE — Progress Notes (Signed)
 [facility] Refill Coordination Note    Vincent Waters, [name]: Jul 22, 1980  Phone: [phone] (home)       All above HIPAA information was verified with patient.         07/11/2024     9:34 AM   Specialty Rx Medication Refill Questionnaire   Which Medications would you like refilled and shipped? Xolair    Please list all current allergies: Latex   Have you missed any doses in the last 30 days? No   Have you had any changes to your medication(s) since your last refill? No   How much of each medication do you have remaining at home? (eg. number of tablets, injections, etc.) 0   If receiving an injectable medication, next injection date is 07/17/2024   Have you experienced any side effects in the last 30 days? No   Please enter the full address (street address, city, state, zip code) where you would like your medication(s) to be delivered to. [name], [place] [address]   Please specify on which day you would like your medication(s) to arrive. Note: if you need your medication(s) within 3 days, please call the pharmacy to schedule your order at [phone]  07/17/2024   Has your insurance changed since your last refill? No   Would you like a pharmacist to call you to discuss your medication(s)? No   Do you require a signature for your package? (Note: if we are billing Medicare Part B or your order contains a controlled substance, we will require a signature) No   I have been provided my out of pocket cost for my medication and approve the pharmacy to charge the amount to my credit card on file. Yes         Completed refill call assessment today to schedule patient's medication shipment from the [facility] [phone]).  All relevant notes have been reviewed.       Confirmed patient received a Conservation Officer, Historic Buildings and a Surveyor, Mining with first shipment. The patient will receive a drug information handout for each medication shipped and additional FDA Medication Guides as required.         REFERRAL TO PHARMACIST     Referral to the pharmacist: Not needed      [facility]     Shipping address confirmed in Epic.     Delivery Scheduled: Yes, Expected medication delivery date: 01/29.     Medication will be delivered via Next Day Courier to the prescription address in Epic WAM.    Vincent Waters   [facility] Specialty Technician

## 2024-07-17 NOTE — Telephone Encounter (Signed)
 Patient's phone call forwarded to the provider.

## 2024-07-17 NOTE — Telephone Encounter (Signed)
 Copied from CRM [id number]. Topic: Access To Clinicians - Req Clinic Call Back  >> Jul 17, 2024  2:57 PM Leim T wrote:  Pt is calling to speak to clinical team and or provider  About test results  Pt stated he has an infection per test results and needing advising on a medication or what steps to take  Pharm has also been trying to reach office   Please advise  Pt is actve on [facility] [address] [address] [place] [address]  Phone: [phone] Fax: [phone]  Hours: Monday through Friday, 9am to 5pm

## 2024-07-18 NOTE — Telephone Encounter (Signed)
 Called pt to discuss results, UA with large LE but culture and wet prep negative. Per pt, discharge has now resolved after washing sex toy he was previously using without washing. Agrees with continuing to monitor for return of symptoms, will defer treatment for now.    Odis Aho, MD, MPH (he/him)  [facility]  [address]   [place], [place] [address]  Phone: [phone]  Fax: [phone]

## 2024-07-18 NOTE — Progress Notes (Unsigned)
 Xolair  PA submitted through EPIC.

## 2024-07-22 MED FILL — XOLAIR 75 MG/0.5 ML SUBCUTANEOUS AUTO-INJECTOR: SUBCUTANEOUS | 28 days supply | Qty: 1 | Fill #6

## 2024-07-22 MED FILL — XOLAIR 300 MG/2 ML SUBCUTANEOUS AUTO-INJECTOR: SUBCUTANEOUS | 28 days supply | Qty: 4 | Fill #6

## 2024-07-22 NOTE — Progress Notes (Signed)
 [facility] 's Xolair  shipment will be sent out as a result of prior authorization now approved.     I have reached out to the patient  at [phone] and communicated the delivery change. We will reschedule the medication for the delivery date that the patient agreed upon.  We have confirmed the delivery date as 2/3, via next day courier.

## 2024-07-24 MED FILL — PRUCALOPRIDE 2 MG TABLET: ORAL | 90 days supply | Qty: 90 | Fill #1
